# Patient Record
Sex: Male | Born: 1935
Health system: Southern US, Community
[De-identification: ages and names within clinical notes are randomized; demographics above are authoritative.]

## PROBLEM LIST (undated history)

## (undated) DIAGNOSIS — E785 Hyperlipidemia, unspecified: Secondary | ICD-10-CM

## (undated) DIAGNOSIS — F329 Major depressive disorder, single episode, unspecified: Secondary | ICD-10-CM

## (undated) DIAGNOSIS — Z973 Presence of spectacles and contact lenses: Secondary | ICD-10-CM

## (undated) DIAGNOSIS — I739 Peripheral vascular disease, unspecified: Secondary | ICD-10-CM

## (undated) DIAGNOSIS — M109 Gout, unspecified: Secondary | ICD-10-CM

## (undated) DIAGNOSIS — D472 Monoclonal gammopathy: Principal | ICD-10-CM

## (undated) DIAGNOSIS — Z9989 Dependence on other enabling machines and devices: Secondary | ICD-10-CM

## (undated) DIAGNOSIS — R3915 Urgency of urination: Secondary | ICD-10-CM

## (undated) DIAGNOSIS — I2510 Atherosclerotic heart disease of native coronary artery without angina pectoris: Secondary | ICD-10-CM

## (undated) DIAGNOSIS — N183 Chronic kidney disease, stage 3 unspecified: Secondary | ICD-10-CM

## (undated) DIAGNOSIS — K4020 Bilateral inguinal hernia, without obstruction or gangrene, not specified as recurrent: Secondary | ICD-10-CM

## (undated) DIAGNOSIS — G4733 Obstructive sleep apnea (adult) (pediatric): Secondary | ICD-10-CM

## (undated) DIAGNOSIS — I10 Essential (primary) hypertension: Secondary | ICD-10-CM

## (undated) DIAGNOSIS — N189 Chronic kidney disease, unspecified: Secondary | ICD-10-CM

## (undated) DIAGNOSIS — F32A Depression, unspecified: Secondary | ICD-10-CM

## (undated) DIAGNOSIS — E119 Type 2 diabetes mellitus without complications: Secondary | ICD-10-CM

## (undated) DIAGNOSIS — R351 Nocturia: Secondary | ICD-10-CM

## (undated) DIAGNOSIS — Z955 Presence of coronary angioplasty implant and graft: Secondary | ICD-10-CM

## (undated) DIAGNOSIS — N2581 Secondary hyperparathyroidism of renal origin: Secondary | ICD-10-CM

## (undated) DIAGNOSIS — M1990 Unspecified osteoarthritis, unspecified site: Secondary | ICD-10-CM

## (undated) DIAGNOSIS — D631 Anemia in chronic kidney disease: Secondary | ICD-10-CM

## (undated) HISTORY — DX: Essential (primary) hypertension: I10

## (undated) HISTORY — DX: Gout, unspecified: M10.9

## (undated) HISTORY — DX: Atherosclerotic heart disease of native coronary artery without angina pectoris: I25.10

## (undated) HISTORY — DX: Hyperlipidemia, unspecified: E78.5

## (undated) HISTORY — DX: Monoclonal gammopathy: D47.2

## (undated) HISTORY — DX: Depression, unspecified: F32.A

## (undated) HISTORY — DX: Presence of coronary angioplasty implant and graft: Z95.5

## (undated) HISTORY — DX: Chronic kidney disease, stage 3 (moderate): N18.3

## (undated) HISTORY — PX: CORONARY ANGIOPLASTY WITH STENT PLACEMENT: SHX49

## (undated) HISTORY — PX: TOTAL KNEE ARTHROPLASTY: SHX125

## (undated) HISTORY — DX: Chronic kidney disease, stage 3 unspecified: N18.30

## (undated) HISTORY — PX: RENAL ANGIOPLASTY: SHX2316

## (undated) HISTORY — PX: HERNIA REPAIR: SHX51

## (undated) HISTORY — DX: Major depressive disorder, single episode, unspecified: F32.9

---

## 1996-04-14 HISTORY — PX: CORONARY ANGIOPLASTY: SHX604

## 1997-09-12 ENCOUNTER — Ambulatory Visit (HOSPITAL_COMMUNITY): Admission: RE | Admit: 1997-09-12 | Discharge: 1997-09-12 | Payer: Self-pay | Admitting: Cardiology

## 1997-09-22 ENCOUNTER — Ambulatory Visit (HOSPITAL_COMMUNITY): Admission: RE | Admit: 1997-09-22 | Discharge: 1997-09-22 | Payer: Self-pay | Admitting: Cardiology

## 1997-11-27 ENCOUNTER — Emergency Department (HOSPITAL_COMMUNITY): Admission: EM | Admit: 1997-11-27 | Discharge: 1997-11-27 | Payer: Self-pay | Admitting: Unknown Physician Specialty

## 1998-04-14 HISTORY — PX: CORONARY ANGIOPLASTY WITH STENT PLACEMENT: SHX49

## 1998-05-14 ENCOUNTER — Inpatient Hospital Stay (HOSPITAL_COMMUNITY): Admission: EM | Admit: 1998-05-14 | Discharge: 1998-05-15 | Payer: Self-pay | Admitting: Emergency Medicine

## 1998-05-14 ENCOUNTER — Encounter: Payer: Self-pay | Admitting: Emergency Medicine

## 2000-03-06 ENCOUNTER — Encounter: Payer: Self-pay | Admitting: Emergency Medicine

## 2000-03-06 ENCOUNTER — Emergency Department (HOSPITAL_COMMUNITY): Admission: EM | Admit: 2000-03-06 | Discharge: 2000-03-06 | Payer: Self-pay | Admitting: Emergency Medicine

## 2000-03-25 ENCOUNTER — Ambulatory Visit (HOSPITAL_COMMUNITY): Admission: RE | Admit: 2000-03-25 | Discharge: 2000-03-25 | Payer: Self-pay

## 2000-06-25 ENCOUNTER — Ambulatory Visit (HOSPITAL_COMMUNITY): Admission: RE | Admit: 2000-06-25 | Discharge: 2000-06-25 | Payer: Self-pay | Admitting: Oncology

## 2000-06-25 ENCOUNTER — Encounter (INDEPENDENT_AMBULATORY_CARE_PROVIDER_SITE_OTHER): Payer: Self-pay | Admitting: Specialist

## 2000-07-14 ENCOUNTER — Ambulatory Visit (HOSPITAL_COMMUNITY): Admission: RE | Admit: 2000-07-14 | Discharge: 2000-07-14 | Payer: Self-pay | Admitting: Oncology

## 2000-07-14 ENCOUNTER — Encounter: Payer: Self-pay | Admitting: Oncology

## 2001-11-17 ENCOUNTER — Ambulatory Visit (HOSPITAL_COMMUNITY): Admission: RE | Admit: 2001-11-17 | Discharge: 2001-11-17 | Payer: Self-pay | Admitting: Gastroenterology

## 2002-05-05 ENCOUNTER — Encounter: Payer: Self-pay | Admitting: Internal Medicine

## 2002-05-05 ENCOUNTER — Encounter: Admission: RE | Admit: 2002-05-05 | Discharge: 2002-05-05 | Payer: Self-pay | Admitting: Internal Medicine

## 2004-01-30 ENCOUNTER — Ambulatory Visit (HOSPITAL_BASED_OUTPATIENT_CLINIC_OR_DEPARTMENT_OTHER): Admission: RE | Admit: 2004-01-30 | Discharge: 2004-01-30 | Payer: Self-pay | Admitting: Internal Medicine

## 2004-03-16 ENCOUNTER — Emergency Department (HOSPITAL_COMMUNITY): Admission: EM | Admit: 2004-03-16 | Discharge: 2004-03-16 | Payer: Self-pay | Admitting: Emergency Medicine

## 2004-03-21 ENCOUNTER — Inpatient Hospital Stay (HOSPITAL_COMMUNITY): Admission: EM | Admit: 2004-03-21 | Discharge: 2004-03-23 | Payer: Self-pay | Admitting: Emergency Medicine

## 2004-07-02 ENCOUNTER — Ambulatory Visit (HOSPITAL_COMMUNITY): Admission: RE | Admit: 2004-07-02 | Discharge: 2004-07-02 | Payer: Self-pay | Admitting: Gastroenterology

## 2005-06-30 ENCOUNTER — Ambulatory Visit: Payer: Self-pay | Admitting: Oncology

## 2005-07-02 ENCOUNTER — Ambulatory Visit (HOSPITAL_COMMUNITY): Admission: RE | Admit: 2005-07-02 | Discharge: 2005-07-02 | Payer: Self-pay | Admitting: Oncology

## 2005-07-22 ENCOUNTER — Inpatient Hospital Stay (HOSPITAL_COMMUNITY): Admission: RE | Admit: 2005-07-22 | Discharge: 2005-07-26 | Payer: Self-pay | Admitting: Orthopedic Surgery

## 2005-08-14 ENCOUNTER — Encounter: Admission: RE | Admit: 2005-08-14 | Discharge: 2005-11-12 | Payer: Self-pay | Admitting: Orthopedic Surgery

## 2005-11-13 ENCOUNTER — Encounter: Admission: RE | Admit: 2005-11-13 | Discharge: 2005-12-18 | Payer: Self-pay | Admitting: Orthopedic Surgery

## 2005-12-20 ENCOUNTER — Inpatient Hospital Stay (HOSPITAL_COMMUNITY): Admission: EM | Admit: 2005-12-20 | Discharge: 2005-12-24 | Payer: Self-pay | Admitting: Emergency Medicine

## 2006-01-08 ENCOUNTER — Encounter (HOSPITAL_COMMUNITY): Admission: RE | Admit: 2006-01-08 | Discharge: 2006-04-08 | Payer: Self-pay | Admitting: Cardiology

## 2006-04-09 ENCOUNTER — Encounter (HOSPITAL_COMMUNITY): Admission: RE | Admit: 2006-04-09 | Discharge: 2006-05-14 | Payer: Self-pay | Admitting: Cardiology

## 2006-05-15 ENCOUNTER — Encounter: Admission: RE | Admit: 2006-05-15 | Discharge: 2006-05-15 | Payer: Self-pay | Admitting: Cardiovascular Disease

## 2006-05-21 ENCOUNTER — Ambulatory Visit (HOSPITAL_COMMUNITY): Admission: RE | Admit: 2006-05-21 | Discharge: 2006-05-22 | Payer: Self-pay | Admitting: Cardiovascular Disease

## 2006-06-24 ENCOUNTER — Ambulatory Visit: Payer: Self-pay | Admitting: Oncology

## 2006-07-01 LAB — CBC WITH DIFFERENTIAL/PLATELET
BASO%: 0.7 % (ref 0.0–2.0)
Basophils Absolute: 0 10*3/uL (ref 0.0–0.1)
EOS%: 6.4 % (ref 0.0–7.0)
Eosinophils Absolute: 0.4 10*3/uL (ref 0.0–0.5)
HCT: 37.7 % — ABNORMAL LOW (ref 38.7–49.9)
HGB: 13.1 g/dL (ref 13.0–17.1)
LYMPH%: 22.7 % (ref 14.0–48.0)
MCH: 31.1 pg (ref 28.0–33.4)
MCHC: 34.7 g/dL (ref 32.0–35.9)
MCV: 89.8 fL (ref 81.6–98.0)
MONO#: 0.4 10*3/uL (ref 0.1–0.9)
MONO%: 6.5 % (ref 0.0–13.0)
NEUT#: 4.2 10*3/uL (ref 1.5–6.5)
NEUT%: 63.7 % (ref 40.0–75.0)
Platelets: 341 10*3/uL (ref 145–400)
RBC: 4.2 10*6/uL (ref 4.20–5.71)
RDW: 13.9 % (ref 11.2–14.6)
WBC: 6.5 10*3/uL (ref 4.0–10.0)
lymph#: 1.5 10*3/uL (ref 0.9–3.3)

## 2006-07-01 LAB — MORPHOLOGY: PLT EST: ADEQUATE

## 2006-07-06 LAB — BASIC METABOLIC PANEL
BUN: 24 mg/dL — ABNORMAL HIGH (ref 6–23)
CO2: 25 mEq/L (ref 19–32)
Calcium: 9.7 mg/dL (ref 8.4–10.5)
Chloride: 105 mEq/L (ref 96–112)
Creatinine, Ser: 1.61 mg/dL — ABNORMAL HIGH (ref 0.40–1.50)
Glucose, Bld: 111 mg/dL — ABNORMAL HIGH (ref 70–99)
Potassium: 4.8 mEq/L (ref 3.5–5.3)
Sodium: 141 mEq/L (ref 135–145)

## 2006-07-06 LAB — IMMUNOFIXATION ELECTROPHORESIS
IgA: 351 mg/dL (ref 68–378)
IgG (Immunoglobin G), Serum: 1570 mg/dL (ref 694–1618)
IgM, Serum: 36 mg/dL — ABNORMAL LOW (ref 60–263)
Total Protein, Serum Electrophoresis: 8.1 g/dL (ref 6.0–8.3)

## 2006-07-06 LAB — KAPPA/LAMBDA LIGHT CHAINS
Kappa free light chain: 2.92 mg/dL — ABNORMAL HIGH (ref 0.33–1.94)
Kappa:Lambda Ratio: 1.77 — ABNORMAL HIGH (ref 0.26–1.65)
Lambda Free Lght Chn: 1.65 mg/dL (ref 0.57–2.63)

## 2007-06-17 ENCOUNTER — Ambulatory Visit: Payer: Self-pay | Admitting: Oncology

## 2007-07-27 LAB — CREATININE CLEARANCE, URINE, 24 HOUR
Collection Interval-CRCL: 24 hours
Creatinine Clearance: 76 mL/min (ref 75–125)
Creatinine, 24H Ur: 1672 mg/d (ref 800–2000)
Creatinine, Urine: 98.4 mg/dL
Creatinine: 1.52 mg/dL — ABNORMAL HIGH (ref 0.40–1.50)
Urine Total Volume-CRCL: 1700 mL

## 2007-07-27 LAB — UIFE/LIGHT CHAINS/TP QN, 24-HR UR
Albumin, U: DETECTED
Free Kappa Lt Chains,Ur: 0.72 mg/dL (ref 0.04–1.51)
Free Kappa/Lambda Ratio: 6.55 ratio — ABNORMAL HIGH (ref 0.46–4.00)
Free Lambda Excretion/Day: 1.87 mg/d
Free Lambda Lt Chains,Ur: 0.11 mg/dL (ref 0.08–1.01)
Free Lt Chn Excr Rate: 12.24 mg/d
Time: 24 hours
Total Protein, Urine-Ur/day: 63 mg/d (ref 10–140)
Total Protein, Urine: 3.7 mg/dL
Volume, Urine: 1700 mL

## 2007-11-25 ENCOUNTER — Emergency Department (HOSPITAL_COMMUNITY): Admission: EM | Admit: 2007-11-25 | Discharge: 2007-11-25 | Payer: Self-pay | Admitting: Emergency Medicine

## 2008-04-14 HISTORY — PX: CARDIOVASCULAR STRESS TEST: SHX262

## 2008-06-30 ENCOUNTER — Ambulatory Visit: Payer: Self-pay | Admitting: Oncology

## 2008-07-04 LAB — CBC WITH DIFFERENTIAL/PLATELET
BASO%: 0.5 % (ref 0.0–2.0)
Basophils Absolute: 0 10*3/uL (ref 0.0–0.1)
EOS%: 5.2 % (ref 0.0–7.0)
Eosinophils Absolute: 0.4 10*3/uL (ref 0.0–0.5)
HCT: 40.8 % (ref 38.4–49.9)
HGB: 13.5 g/dL (ref 13.0–17.1)
LYMPH%: 27.5 % (ref 14.0–49.0)
MCH: 29.7 pg (ref 27.2–33.4)
MCHC: 33.1 g/dL (ref 32.0–36.0)
MCV: 89.9 fL (ref 79.3–98.0)
MONO#: 0.5 10*3/uL (ref 0.1–0.9)
MONO%: 6.6 % (ref 0.0–14.0)
NEUT#: 4.4 10*3/uL (ref 1.5–6.5)
NEUT%: 60.2 % (ref 39.0–75.0)
Platelets: 347 10*3/uL (ref 140–400)
RBC: 4.54 10*6/uL (ref 4.20–5.82)
RDW: 13.3 % (ref 11.0–14.6)
WBC: 7.3 10*3/uL (ref 4.0–10.3)
lymph#: 2 10*3/uL (ref 0.9–3.3)
nRBC: 0 % (ref 0–0)

## 2008-07-04 LAB — MORPHOLOGY: PLT EST: ADEQUATE

## 2008-07-05 LAB — COMPREHENSIVE METABOLIC PANEL
ALT: 28 U/L (ref 0–53)
AST: 24 U/L (ref 0–37)
Albumin: 4.8 g/dL (ref 3.5–5.2)
Alkaline Phosphatase: 98 U/L (ref 39–117)
BUN: 23 mg/dL (ref 6–23)
CO2: 22 mEq/L (ref 19–32)
Calcium: 10 mg/dL (ref 8.4–10.5)
Chloride: 103 mEq/L (ref 96–112)
Creatinine, Ser: 1.69 mg/dL — ABNORMAL HIGH (ref 0.40–1.50)
Glucose, Bld: 124 mg/dL — ABNORMAL HIGH (ref 70–99)
Potassium: 4.2 mEq/L (ref 3.5–5.3)
Sodium: 137 mEq/L (ref 135–145)
Total Bilirubin: 0.4 mg/dL (ref 0.3–1.2)
Total Protein: 8.1 g/dL (ref 6.0–8.3)

## 2008-07-05 LAB — KAPPA/LAMBDA LIGHT CHAINS
Kappa free light chain: 1.64 mg/dL (ref 0.33–1.94)
Kappa:Lambda Ratio: 0.64 (ref 0.26–1.65)
Lambda Free Lght Chn: 2.55 mg/dL (ref 0.57–2.63)

## 2008-07-05 LAB — IGG: IgG (Immunoglobin G), Serum: 1560 mg/dL (ref 694–1618)

## 2009-06-28 ENCOUNTER — Ambulatory Visit: Payer: Self-pay | Admitting: Oncology

## 2009-07-02 LAB — MORPHOLOGY
PLT EST: ADEQUATE
RBC Comments: NORMAL

## 2009-07-02 LAB — CBC WITH DIFFERENTIAL/PLATELET
BASO%: 0.5 % (ref 0.0–2.0)
Basophils Absolute: 0 10*3/uL (ref 0.0–0.1)
EOS%: 5.7 % (ref 0.0–7.0)
Eosinophils Absolute: 0.4 10*3/uL (ref 0.0–0.5)
HCT: 39.7 % (ref 38.4–49.9)
HGB: 12.9 g/dL — ABNORMAL LOW (ref 13.0–17.1)
LYMPH%: 24.4 % (ref 14.0–49.0)
MCH: 30.2 pg (ref 27.2–33.4)
MCHC: 32.5 g/dL (ref 32.0–36.0)
MCV: 93 fL (ref 79.3–98.0)
MONO#: 0.4 10*3/uL (ref 0.1–0.9)
MONO%: 6.4 % (ref 0.0–14.0)
NEUT#: 3.9 10*3/uL (ref 1.5–6.5)
NEUT%: 63 % (ref 39.0–75.0)
Platelets: 343 10*3/uL (ref 140–400)
RBC: 4.27 10*6/uL (ref 4.20–5.82)
RDW: 13.6 % (ref 11.0–14.6)
WBC: 6.1 10*3/uL (ref 4.0–10.3)
lymph#: 1.5 10*3/uL (ref 0.9–3.3)
nRBC: 0 % (ref 0–0)

## 2009-07-03 LAB — COMPREHENSIVE METABOLIC PANEL
ALT: 27 U/L (ref 0–53)
AST: 25 U/L (ref 0–37)
Albumin: 4.6 g/dL (ref 3.5–5.2)
Alkaline Phosphatase: 72 U/L (ref 39–117)
BUN: 35 mg/dL — ABNORMAL HIGH (ref 6–23)
CO2: 21 mEq/L (ref 19–32)
Calcium: 9.8 mg/dL (ref 8.4–10.5)
Chloride: 107 mEq/L (ref 96–112)
Creatinine, Ser: 1.94 mg/dL — ABNORMAL HIGH (ref 0.40–1.50)
Glucose, Bld: 136 mg/dL — ABNORMAL HIGH (ref 70–99)
Potassium: 4.9 mEq/L (ref 3.5–5.3)
Sodium: 140 mEq/L (ref 135–145)
Total Bilirubin: 0.4 mg/dL (ref 0.3–1.2)
Total Protein: 7.8 g/dL (ref 6.0–8.3)

## 2009-07-03 LAB — KAPPA/LAMBDA LIGHT CHAINS
Kappa free light chain: 2.57 mg/dL — ABNORMAL HIGH (ref 0.33–1.94)
Kappa:Lambda Ratio: 0.98 (ref 0.26–1.65)
Lambda Free Lght Chn: 2.63 mg/dL (ref 0.57–2.63)

## 2009-07-03 LAB — IGG: IgG (Immunoglobin G), Serum: 1700 mg/dL — ABNORMAL HIGH (ref 694–1618)

## 2010-05-01 DIAGNOSIS — I2510 Atherosclerotic heart disease of native coronary artery without angina pectoris: Secondary | ICD-10-CM | POA: Insufficient documentation

## 2010-05-01 DIAGNOSIS — E1169 Type 2 diabetes mellitus with other specified complication: Secondary | ICD-10-CM | POA: Insufficient documentation

## 2010-05-01 DIAGNOSIS — I10 Essential (primary) hypertension: Secondary | ICD-10-CM | POA: Insufficient documentation

## 2010-05-01 DIAGNOSIS — E782 Mixed hyperlipidemia: Secondary | ICD-10-CM

## 2010-05-01 DIAGNOSIS — I739 Peripheral vascular disease, unspecified: Secondary | ICD-10-CM | POA: Insufficient documentation

## 2010-05-02 ENCOUNTER — Encounter: Payer: Self-pay | Admitting: Internal Medicine

## 2010-05-02 ENCOUNTER — Ambulatory Visit
Admission: RE | Admit: 2010-05-02 | Discharge: 2010-05-02 | Payer: Self-pay | Source: Home / Self Care | Attending: Internal Medicine | Admitting: Internal Medicine

## 2010-05-16 NOTE — Assessment & Plan Note (Signed)
Summary: np6   Visit Type:  Initial Consult Primary Provider:  Dr Posey Pronto [facility]  CC:  no complaints.  History of Present Illness: Mr. Ficco is a delightful 77 y/o male with multiple medical problems including DM2, HTN, HL, mild renal insufficiency, RAS s/p stenting of L renal artery in 2008, CAD s/p multiple PCIs on OM-1 (last cath 2007). Previously foolowed by Dr. Claiborne Billings and now transferring his care here.  Last Myoview 2010. Ok by his report.    Doing well. Relatively active around the house but does not exercise regularly. No CP or SOB. No HF. BP well contorlled. Takes all meds as prescribed,      Preventive Screening-Counseling & Management  Caffeine-Diet-Exercise     Does Patient Exercise: no  Problems Prior to Update: 1)  Hypertension, Unspecified  (ICD-401.9) 2)  Cad, Native Vessel  (ICD-414.01) 3)  Hyperlipidemia-mixed  (ICD-272.4) 4)  Pvd  (ICD-443.9)  Medications Prior to Update: 1)  None  Current Medications (verified): 1)  Allopurinol 100 Mg Tabs (Allopurinol) .... Once Daily 2)  Aspirin Ec 325 Mg Tbec (Aspirin) .... Take One Tablet By Mouth Daily 3)  Citalopram Hydrobromide 20 Mg Tabs (Citalopram Hydrobromide) .... Two Times A Day 4)  Furosemide 40 Mg Tabs (Furosemide) .... Take One Tablet By Mouth Daily. 5)  Lisinopril 10 Mg Tabs (Lisinopril) .... Take One Tablet By Mouth Daily 6)  Losartan Potassium 25 Mg Tabs (Losartan Potassium) .... Take 1 Tablet By Mouth Once A Day 7)  Metformin Hcl 500 Mg Tabs (Metformin Hcl) .... Take 1 Tablet By Mouth Once A Day 8)  Metoprolol Succinate 100 Mg Xr24h-Tab (Metoprolol Succinate) .... Take One Tablet By Mouth Daily 9)  Plavix 75 Mg Tabs (Clopidogrel Bisulfate) .... Take One Tablet By Mouth Daily 10)  Nitrostat 0.4 Mg Subl (Nitroglycerin) .[name].. 1 Tablet Under Tongue At Onset of Chest Pain; You May Repeat Every 5 Minutes For Up To 3 Doses. 11)  Simvastatin 80 Mg Tabs (Simvastatin) .... Take One Tablet By Mouth  Daily At Bedtime  Allergies (verified): No Known Drug Allergies  Past History:  Family History: Last updated: 05/02/2010 Family History of Diabetes:  Family History of Coronary Artery Disease:  Family History of Cancer:   Social History: Last updated: 05/02/2010 Retired  Married  Tobacco Use - No.  Alcohol Use - no Drug Use - no Regular Exercise - no  Risk Factors: Exercise: no (05/02/2010)  Risk Factors: Smoking Status: never (05/01/2010)  Past Medical History: 1. Renal insufficiency 2. CAD    --s/p PTCA of OM in 1998    --s/p BMS OM in 2000   --cath 9/07/ Lm ok LAD ok. LCX 95% in OM prior to previous stent RCA. nondominant normal EF normal    --s/p Cypher DES to OM 2007 (placed proxiaml to previous stent) 3. Diabetes Type 2 4. Hyperlipidemia 5. PVD 6. Hypertension  Past Surgical History: Knee Arthroplasty-Total  Family History: Reviewed history from 05/01/2010 and no changes required. Family History of Diabetes:  Family History of Coronary Artery Disease:  Family History of Cancer:   Social History: Reviewed history from 05/01/2010 and no changes required. Retired  Married  Tobacco Use - No.  Alcohol Use - no Drug Use - no Regular Exercise - no Does Patient Exercise:  no  Review of Systems       As per HPI and past medical history; otherwise all systems negative.   Vital Signs:  Patient profile:   77 year old male  Height:      68 inches Weight:      182 pounds BMI:     27.77 Pulse rate:   51 / minute BP sitting:   132 / 62  (left arm) Cuff size:   regular  Vitals Entered By: Mignon Pine, RMA (May 02, 2010 12:30 PM)  Physical Exam  General:  Elderly: well appearing. no resp difficulty HEENT: normal Neck: supple. no JVD. Carotids 2+ bilat; no bruits. No lymphadenopathy or thryomegaly appreciated. Cor: PMI nondisplaced. Regular rate & rhythm. No rubs, murmur. +s4 Lungs: clear Abdomen: soft, nontender, nondistended. No  hepatosplenomegaly. No bruits or masses. Good bowel sounds. Extremities: no cyanosis, clubbing, rash, edema Neuro: alert & orientedx3, cranial nerves grossly intact. moves all 4 extremities w/o difficulty. affect pleasant    Impression & Recommendations:  Problem # 1:  CAD, NATIVE VESSEL (ICD-414.01) Stable. No evidence of ischemia. Continue current regimen.   Problem # 2:  HYPERTENSION, UNSPECIFIED (ICD-401.9) BP well controlled. Given that he is on low doses of bothe lisinopril and losartant, we discussed potentiall consolidating to lisinopril 20 but he prefers to stay on his current regimen.  Problem # 3:  HYPERLIPIDEMIA-MIXED (B2193296.4) Followed by Dr. Posey Pronto. We discussed Black Box warning re: simva 80 and suggested switching to Lipitor. However, he does not want to switch due to cost. As he has been stable on thsi for > 3 yrs will continue. Goal LDL < 70.  Other Orders: EKG w/ Interpretation (93000)  Patient Instructions: 1)  Your physician wants you to follow-up in:  6 months.  You will receive a reminder letter in the mail two months in advance. If you don't receive a letter, please call our office to schedule the follow-up appointment.

## 2010-05-24 ENCOUNTER — Encounter: Payer: Self-pay | Admitting: Cardiovascular Disease

## 2010-05-24 ENCOUNTER — Encounter (INDEPENDENT_AMBULATORY_CARE_PROVIDER_SITE_OTHER): Payer: Medicare Other

## 2010-05-24 DIAGNOSIS — I701 Atherosclerosis of renal artery: Secondary | ICD-10-CM

## 2010-05-24 DIAGNOSIS — N183 Chronic kidney disease, stage 3 unspecified: Secondary | ICD-10-CM

## 2010-05-28 ENCOUNTER — Encounter: Payer: Self-pay | Admitting: Cardiovascular Disease

## 2010-05-30 NOTE — Miscellaneous (Signed)
Summary: Orders Update  Clinical Lists Changes  Orders: Added new Test order of Renal Artery Duplex (Renal Artery Duplex) - Signed 

## 2010-07-01 ENCOUNTER — Encounter (HOSPITAL_BASED_OUTPATIENT_CLINIC_OR_DEPARTMENT_OTHER): Payer: Medicare Other | Admitting: Oncology

## 2010-07-01 ENCOUNTER — Other Ambulatory Visit: Payer: Self-pay | Admitting: Oncology

## 2010-07-01 DIAGNOSIS — N189 Chronic kidney disease, unspecified: Secondary | ICD-10-CM

## 2010-07-01 DIAGNOSIS — D472 Monoclonal gammopathy: Secondary | ICD-10-CM

## 2010-07-01 LAB — CBC WITH DIFFERENTIAL/PLATELET
BASO%: 0.3 % (ref 0.0–2.0)
Basophils Absolute: 0 10*3/uL (ref 0.0–0.1)
EOS%: 7.4 % — ABNORMAL HIGH (ref 0.0–7.0)
Eosinophils Absolute: 0.5 10*3/uL (ref 0.0–0.5)
HCT: 34.2 % — ABNORMAL LOW (ref 38.4–49.9)
HGB: 11.4 g/dL — ABNORMAL LOW (ref 13.0–17.1)
LYMPH%: 18.7 % (ref 14.0–49.0)
MCH: 29.8 pg (ref 27.2–33.4)
MCHC: 33.3 g/dL (ref 32.0–36.0)
MCV: 89.5 fL (ref 79.3–98.0)
MONO#: 0.5 10*3/uL (ref 0.1–0.9)
MONO%: 7.6 % (ref 0.0–14.0)
NEUT#: 4.1 10*3/uL (ref 1.5–6.5)
NEUT%: 66 % (ref 39.0–75.0)
Platelets: 414 10*3/uL — ABNORMAL HIGH (ref 140–400)
RBC: 3.82 10*6/uL — ABNORMAL LOW (ref 4.20–5.82)
RDW: 13.8 % (ref 11.0–14.6)
WBC: 6.2 10*3/uL (ref 4.0–10.3)
lymph#: 1.2 10*3/uL (ref 0.9–3.3)

## 2010-07-01 LAB — MORPHOLOGY: PLT EST: INCREASED

## 2010-07-02 LAB — COMPREHENSIVE METABOLIC PANEL
ALT: 25 U/L (ref 0–53)
AST: 28 U/L (ref 0–37)
Albumin: 4 g/dL (ref 3.5–5.2)
Alkaline Phosphatase: 92 U/L (ref 39–117)
BUN: 25 mg/dL — ABNORMAL HIGH (ref 6–23)
CO2: 21 mEq/L (ref 19–32)
Calcium: 9.3 mg/dL (ref 8.4–10.5)
Chloride: 107 mEq/L (ref 96–112)
Creatinine, Ser: 1.36 mg/dL (ref 0.40–1.50)
Glucose, Bld: 87 mg/dL (ref 70–99)
Potassium: 4.8 mEq/L (ref 3.5–5.3)
Sodium: 138 mEq/L (ref 135–145)
Total Bilirubin: 0.4 mg/dL (ref 0.3–1.2)
Total Protein: 7 g/dL (ref 6.0–8.3)

## 2010-07-02 LAB — KAPPA/LAMBDA LIGHT CHAINS
Kappa free light chain: 2.2 mg/dL — ABNORMAL HIGH (ref 0.33–1.94)
Kappa:Lambda Ratio: 0.7 (ref 0.26–1.65)
Lambda Free Lght Chn: 3.16 mg/dL — ABNORMAL HIGH (ref 0.57–2.63)

## 2010-07-02 LAB — IGG: IgG (Immunoglobin G), Serum: 1650 mg/dL — ABNORMAL HIGH (ref 694–1618)

## 2010-07-02 LAB — LACTATE DEHYDROGENASE: LDH: 101 U/L (ref 94–250)

## 2010-07-16 ENCOUNTER — Encounter (HOSPITAL_BASED_OUTPATIENT_CLINIC_OR_DEPARTMENT_OTHER): Payer: Medicare Other | Admitting: Oncology

## 2010-07-16 ENCOUNTER — Other Ambulatory Visit: Payer: Self-pay | Admitting: Oncology

## 2010-07-16 DIAGNOSIS — N189 Chronic kidney disease, unspecified: Secondary | ICD-10-CM

## 2010-07-16 DIAGNOSIS — I1310 Hypertensive heart and chronic kidney disease without heart failure, with stage 1 through stage 4 chronic kidney disease, or unspecified chronic kidney disease: Secondary | ICD-10-CM

## 2010-07-16 DIAGNOSIS — D472 Monoclonal gammopathy: Secondary | ICD-10-CM

## 2010-07-16 DIAGNOSIS — I2510 Atherosclerotic heart disease of native coronary artery without angina pectoris: Secondary | ICD-10-CM

## 2010-07-18 LAB — UIFE/LIGHT CHAINS/TP QN, 24-HR UR
Albumin, U: DETECTED
Alpha 1, Urine: DETECTED — AB
Alpha 2, Urine: DETECTED — AB
Beta, Urine: DETECTED — AB
Free Kappa Lt Chains,Ur: 2.92 mg/dL — ABNORMAL HIGH (ref 0.04–1.51)
Free Kappa/Lambda Ratio: 16.22 ratio — ABNORMAL HIGH (ref 0.46–4.00)
Free Lambda Excretion/Day: 1.44 mg/d
Free Lambda Lt Chains,Ur: 0.18 mg/dL (ref 0.08–1.01)
Free Lt Chn Excr Rate: 23.36 mg/d
Gamma Globulin, Urine: DETECTED — AB
Time: 24 hours
Total Protein, Urine-Ur/day: 56 mg/d (ref 10–140)
Total Protein, Urine: 7 mg/dL
Volume, Urine: 800 mL

## 2010-08-30 NOTE — H&P (Signed)
Kevin Schwartz, Kevin Schwartz             ACCOUNT NO.:  [id number]   MEDICAL RECORD NO.:  [id number]          PATIENT TYPE:  INP   LOCATION:  NA                           FACILITY:  [facility]   PHYSICIAN:  Tarri Glenn, M.D.  DATE OF BIRTH:  1931/04/03   DATE OF ADMISSION:  07/22/2005  DATE OF DISCHARGE:                                HISTORY & PHYSICAL   CHIEF COMPLAINT:  Pain in my left knee.   PRESENT ILLNESS:  This is a 77 year old black male been seen by [place] for  continuing progressive problems concerning pain into his left knee.  He has  locking stiffness, inability to get about due to progressive problems  concerning pain into this knee which has been going on for several years  now.  He has lost range of motion in the knee to the point of 10 degrees  lacking in full extension.  He has also developed a genu varum.  He is  really quite tired of this pain and discomfort.  He is having progressive  problems with his day-to-day activities, and after much consideration  including the risks and benefits of surgery, it was decided he would benefit  from surgical intervention and is being admitted for total knee replacement  arthroplasty to the left knee.   Dr. __________ is his family physician with [facility].  He has  had workup by Dr. __________ as well as Dr. Beryle Beams.  Dr. Megan Salon is  his cardiologist.   ALLERGIES:  No known medical allergies.   MEDICATIONS:  1.  Allopurinol 100 mg daily.  2.  Aspirin 325 mg daily (will stop preoperatively).  3.  Colchicine 0.6 mg one daily.  4.  Diovan 40 mg daily.  5.  Indomethacin 500 mg daily (?).  6.  Metformin 500 mg daily.  7.  Metoprolol 100 mg two daily.   PAST MEDICAL HISTORY:  1.  Hypertension.  2.  History of myocardial infarction, 1998.  3.  Gout.  4.  He has had stents implanted in the coronary arteries in 1999 and 2000.  5.  Herniated nucleus pulposus in 1998.  6.  He has non-insulin-dependent diabetes  mellitus.   FAMILY HISTORY:  Positive for diabetes in his family.   SOCIAL HISTORY:  The patient is married, retired, no intake of alcohol or  tobacco products.  His wife, Resa Miner, will be major caregiver after surgery.   REVIEW OF SYSTEMS:  CNS:  No seizure disorder, paralysis, numbness, double  vision.  RESPIRATORY:  No productive cough, no hemoptysis, no shortness of  breath.  CARDIOVASCULAR:  No chest pain, no angina, no orthopnea.  GASTROINTESTINAL:  No nausea, vomiting, melena, bloody stools.  GENITOURINARY:  No discharge, dysuria, hematuria.  He has some nocturia.   PHYSICAL EXAMINATION:  GENERAL:  Alert, cooperative, friendly 77 year old  black male who is accompanied by his wife.  VITAL SIGNS:  Blood pressure  164/92, pulse 60, respirations 12, temperature  98.  HEENT:  Normocephalic.  PERLA.  EOMs intact.  Oropharynx is clear.  CHEST:  Clear to auscultation.  No rhonchi.  No rales.  HEART:  Regular rate and rhythm.  No murmurs are heard.  ABDOMEN:  Soft, nontender.  Liver spleen not felt.  GENITALIA/RECTAL:  Not done.  Not pertinent to present illness.  EXTREMITIES:  The left knee as in present illness above.   ADMITTING DIAGNOSES:  1.  End stage osteoarthritis of the left knee.  2.  Hypertension.  3.  Coronary artery disease.  4.  Coronary stents.  5.  Gout.  6.  Sleep apnea.   PLAN:  The patient will undergo a left total knee replacement arthroplasty.  He will bring his CPAP machine to the hospital with him to use during his  postoperative period.  Should we have any cardiac problems, we will  certainly call Dr. Melvern Banker.  And should we have any medical problems, we will  call Dr. __________ .  We plan home health after his regular  hospitalization.      Dooley L. Vanita Ingles.    ______________________________  Tarri Glenn, M.D.    DLU/MEDQ  D:  07/10/2005  T:  07/11/2005  Job:  [id number]

## 2010-08-30 NOTE — Procedures (Signed)
NAMETAIJUAN, FETCH             ACCOUNT NO.:  [id number]   MEDICAL RECORD NO.:  [id number]          PATIENT TYPE:  OUT   LOCATION:  SLEEP CENTER                 FACILITY:  [facility]   PHYSICIAN:  Clinton D. Annamaria Boots, M.D. DATE OF BIRTH:  20-Jan-1931   DATE OF STUDY:  01/30/2004                              NOCTURNAL POLYSOMNOGRAM   REFERRING PHYSICIAN:  Willey Blade, M.D.   INDICATION FOR THE STUDY:  Hypersomnia with sleep apnea.   EPWORTH SLEEPINESS SCORE:  10/24   BODY MASS INDEX:  31   WEIGHT:  208 pounds   SLEEP ARCHITECTURE:  Total sleep time 378 minutes with sleep efficiency 83%.  Stage I was 12%, stage II 53%, stages III and IV were absent, REM was 35% of  total sleep time, latency to sleep onset 49 minutes, latency to REM 67  minutes, awake after sleep onset 28 minutes, arousal index 14.   RESPIRATORY DATA:  Split-study protocol.  RDI 51.6/hr indicating severe  obstructive sleep apnea/hypopnea syndrome before CPAP.  This included 96  obstructive apneas and 48 hypopneas before CPAP.  He slept only on his back  during the initial part of the study.  REM RDI was 22/hr.  CPAP was titrated  to 10 CWP, RDI 0/hr using a medium Respironics ComfortGel Mask and heated  humidifier.   OXYGEN DATA:  Loud snoring with oxygen desaturation to a nadir of 81% before  CPAP.  After CPAP titration oxygen saturation held 97-98%.   CARDIAC DATA:  Sinus rhythm with occasional sinus pause.   MOVEMENT/PARASOMNIA:  Two hundred sixty two limb jerks were recorded of  which 17 were associated with arousal or awakening for a periodic limb  movement with arousal index of 2.5/hr which is mildly increased.   IMPRESSION/RECOMMENDATION:  Severe obstructive sleep apnea/hypopnea  syndrome, respiratory disturbance index 51.6/hr with desaturation to 81%.  Successful continuous positive airway pressure titration to 10  CWP, respiratory disturbance index 0/hr using a medium Respironics  ComfortGel Mask.   Mild periodic limb movement with arousal, 2.5/hr.                                                           Tarri Fuller D. Annamaria Boots, M.D.  Diplomate, [employer] Board   CDY/MEDQ  D:  02/04/2004 11:53:05  T:  02/04/2004 22:16:04  Job:  [id number]

## 2010-08-30 NOTE — Discharge Summary (Signed)
NAMECLARNECE, DORT NO.:  [id number]   MEDICAL RECORD NO.:  [id number]          PATIENT TYPE:  OIB   LOCATION:  3703                         FACILITY:  [facility]   PHYSICIAN:  Quay Burow, M.D.   DATE OF BIRTH:  11/24/1930   DATE OF ADMISSION:  05/21/2006  DATE OF DISCHARGE:  05/22/2006                               DISCHARGE SUMMARY   DISCHARGE DIAGNOSES:  1. Peripheral vascular disease with renal artery stenosis, status post      left renal artery stenting this admission.  2. Coronary disease, previous obtuse marginal stenting with a Cypher      stent by Dr. Claiborne Billings.  3. Dyslipidemia.  4. Non-insulin diabetes.  5. Mild renal insufficiency with a creatinine of 1.4 in the hospital.  6. History of gout.   HOSPITAL COURSE:  The patient is a 77 year old male followed by Dr.  Melvern Banker with a history of coronary disease.  He had a catheterization in  September 2007 and had an OM Cypher stent placed by Dr. Claiborne Billings.  At that  time he was noted to have an 85-90% left renal artery stenosis.  Renal  Doppler study as an outpatient suggested significant stenosis.  He does  have mild renal insufficiency.  His creatinine has gone as high as 1.9  in the office.  He was set up to come in for a peripheral angiogram.  This was done on May 21, 2006.  He had a left renal artery stenosis  that was dilated and stented.  Postprocedure he was hydrated.  We held  his ARB and his diuretics.  We feel he can be discharged May 22, 2006.  He will need follow-up renal Dopplers as an outpatient.  He will  need a BMP in the office next week.   DISCHARGE MEDICATIONS:  1. Allopurinol 100 mg a day.  2. Coated aspirin once a day.  3. Plavix 75 mg a day.  4. Colchicine 0.6 mg once a day.  5. Diovan 80 mg a day.  6. Metformin 500 mg a day, he will start this on Sunday.  7. Metoprolol has been cut back to 50 mg once a day for some transient      AV block.  8. Simvastatin 80 mg a day.  9.  Nitroglycerin sublingual p.r.n.  10.Lasix 40 mg a day.   LABORATORY DATA:  White count 7.5, hemoglobin 11.8, hematocrit 34.9,  platelets 344.  Sodium 138, potassium 4.3, BUN 14, creatinine 1.4.  TSH  is pending.  Chest x-ray done on the 1st shows cardiomegaly with some  minimal scarring at the left base.   DISPOSITION:  The patient is discharged in stable condition.  He did  have some transient AV block on telemetry with a dropped QRS.  We cut  his metoprolol back to 50 mg once a day.  He will have a BMP next week.      Erlene Quan, P.A.      Quay Burow, M.D.  Electronically Signed    LKK/MEDQ  D:  05/22/2006  T:  05/22/2006  Job:  [id number]   cc:  Nolene Ebbs, M.D.

## 2010-08-30 NOTE — Cardiovascular Report (Signed)
NAMEIRAM, SEMINARIO NO.:  [id number]   MEDICAL RECORD NO.:  [id number]          PATIENT TYPE:  OIB   LOCATION:  3703                         FACILITY:  [facility]   PHYSICIAN:  Quay Burow, M.D.   DATE OF BIRTH:  Aug 24, 1930   DATE OF PROCEDURE:  05/21/2006  DATE OF DISCHARGE:                            CARDIAC CATHETERIZATION   HISTORY:  Mr. Bueti is a 77 year old, mildly overweight, African-  American male with a history of CAD, status post PCI stenting with OM by  Dr. Ellouise Newer on December 22, 2005.  History of hypertension and cath  at that time revealed 80% left renal artery stenosis.  Renal Doppler  suggested systolic velocity of [id number].  His creatinine was right in the 1.5  to 1.9 range.  He was brought in for PT and stenting for treatment of  renal vascular hypertension and renal preservation.   PROCEDURE:  The patient was brought to the [facility] angiographic suite  in the postabsorptive state.  He was pre-medicated with p.o. Valium and  IV bicarb for radiocontrast nephropathy prophylaxis.  His right leg was  prepped and draped in the usual sterile fashion.  One-percent Xylocaine  was used for local anesthesia.  A 6 French sheath was inserted into the  right femoral artery using the standard Seldinger technique.  A 5 French  pigtail catheter was used for abdominal aortography.  Isovue was used  for the entirety of the case.   Angiographic results:  80% segmental, proximal/ostial left prerenal  artery stenosis.  The right renal artery appeared normal as did the  inferior renal abdominal aorta.  The iliacs were torturous.   PROCEDURE IN DETAIL:  The patient received 3000 units of heparin  intravenously.  Using a 6 [place] short JR4 guide catheter as well as a  __________ stabilizing wire and a 4 x 15 aviator balloon, PT was  performed.  Following this, a 6 x 15 aviator __________ balloon was then  carefully positioned angiographically and fluoroscopically  and deployed  at 8 to 10 atmospheres, resulting in reduction of 80% proximal left  renal artery stenosis to 0% residual.  The patient tolerated the  procedure well.  The guidewire and catheter were removed.  As mentioned,  the sheath was removed.  Pressure was held on the groin to achieve  hemostasis.  The patient left the lab in stable condition.  Re-hydrated  overnight, discharged home in the morning and we will get follow-up  renal Doppler studies after which we will see back in the office.  He  left the lab in stable condition.      Quay Burow, M.D.  Electronically Signed     JB/MEDQ  D:  05/21/2006  T:  05/21/2006  Job:  [id number]   cc:   Cath lab  [facility]  Bryson Dames, M.D.

## 2010-08-30 NOTE — Op Note (Signed)
[name], [name]             ACCOUNT NO.:  [id number]   MEDICAL RECORD NO.:  [id number]          PATIENT TYPE:  INP   LOCATION:  1521                         FACILITY:  [facility]   PHYSICIAN:  Tarri Glenn, M.D.  DATE OF BIRTH:  17-Dec-1930   DATE OF PROCEDURE:  07/22/2005  DATE OF DISCHARGE:                                 OPERATIVE REPORT   PREOPERATIVE DIAGNOSIS:  Osteoarthritis left knee.   POSTOPERATIVE DIAGNOSIS:  Osteoarthritis left knee.   OPERATION:  Osteonics total knee replacement left.   SURGEON:  Tarri Glenn, M.D.   ASSISTANTElodia Florence. Underwood, P.A.-C.   ANESTHESIA:  General.   INDICATIONS FOR PROCEDURE:  Significant pain, left knee, with advanced  tricompartmental arthritis with bone-on-bone abutment and roughly a 15  degrees flexion contracture.   DESCRIPTION OF PROCEDURE:  Prophylactic antibiotic, satisfactory general  anesthesia, pneumatic tourniquet, SureFoot left hip stabilizer, DuraPrep  from tourniquet to ankle, and draped in sterile field, Ioban employed.  The  leg had Esmarch applied sterilely and tourniquet inflated to 325 mmHg.  I  made a central vertical incision and with a median parapatellar incision,  opened the joint freeing up the patellar mechanism so that the patella could  be everted and the knee flexed.  I released the pes anserinus and medial  collateral ligament off the tibia.  Resected remnants of the menisci and of  the ACL and a portion of the PCL.  We debrided up the patella and the  femoral condyle with rongeurs.  I then made a 5/16 drill hole in the distal  femur followed by the canal finder and the axis aligner for 5 degrees valgus  cut on the left knee.  I elected to take 12 mm off the distal femur because  of the flexion contracture rather than the usual 10 mm.  I then made a  leveling cut on the tibia to allow a little additional room to place the  distal femoral sizing jig and we sized the femur at a size 7.  Scribe  lines  were placed in the distal femur to place the distal femoral cutting jig and  anterior and posterior cuts and posterior and anterior chamfer were then  made.  I then returned to the tibia and with baseplate initially a size 7 to  9 was felt to be the appropriate size.  I used a 7 to match the femur.  The  initial intramedullary drill hole was made followed by step-cut drill canal  finder.  I then inserted the intramedullary rod with the external cutting  jig set for a 90 degree cut. With his flexion contracture and tightness of  the knee, I went ahead and took a 6 mm cut off the recessed the medial  tibial plateau.  Having made this cut and trimmed up the remnants of the  posterior cruciate ligament and menisci, I then placed the jig on the distal  femur for creating both the patellar groove and also for the notchplasty.  Having completed these two procedures, I then used a laminar spreader and  removed some residual soft tissue  and bone beneath the posterior femoral  condyles.  We then went through a trial reduction and found that a 10 mm  insert fit nicely so no additional tibial bone had to be removed.  While  these components were in place, I used the external rod splitting the  bimalleolar distance to mark the scribe lines on the tibia for final  positioning of the base plate.  Also, with the knee in extension, I measured  the patella at size 26 and I used the 10 mm recessed cutting jig to make the  initial 10 mm cut followed by the jig for creating the three fixation holes.  I then placed the trial patella trimming up bone from around the patella.  We then returned to the tibia.  We had noted a large cyst in the anterior  medial tibia measuring about 2 cm by 1 cm just adjacent to the outer wall of  the anterior medial tibia.  I curetted out all of the synovial type tissue  from the canal which we then packed with cancellus bone taken from the case  during the bone cutting.  This  was impacted.  We then sized the tibia at a 9  rather than 7, placed the baseplate using the previously noted scribe lines,  affixing it with three stabilizing pins.  We then used the tripod apparatus  for reaming for the tibial keel going up to a 9 cemented.  Following this, I  then began reaming the distal canal.  He is was a very active man with the  significant varus deformity and I felt that an 80 mm extension would give  him a more stable knee.  I ended up reaming up to a size 19. The trial 80 mm  extension was placed on the trial tibial component and fit nicely.  Having  completed all this, we then water picked the knee while the components were  opened and the final extender placed on the tibial component.  At the same  time, the scrub nurse mixed the methyl methacrylate and I then individually  glued in the components.  We did not glue in the extender for the tibia but  just the parent portion of the tibial component, impacting it, and removing  excess methacrylate.  I placed an 8 mm spacer while we glued in the femur,  impacted it, held it in extension while we glued in the patellar and held it  with a patellar holding clamp.  When the methacrylate had hardened, we  trimmed up remnants of methacrylate from around the components and went  through another trial reduction finding that indeed the 10-mm spacer was the  appropriate size.  Once again, after irrigating and checking to make sure  there was no particulate matter in the knee, the final 8 mm posterior  stabilized insert was placed. The knee was reduced and found be nice and  stable with motion past 90 degrees in full extension.  The patella was  stable and did not require a lateral release.  A Hemovac was placed and the  wound was closed in layers with interrupted #1 Vicryl in the synovium and  capsule distally and two layers in the quadriceps proximally, the subcutaneous tissue was closed with a combination of #1, 0 and 2-0  Vicryl,  skin with staples.  Betadine and Adaptic dry, sterile dressing were applied.  The tourniquet was released at 2 hours of tourniquet time.  The leg was  placed in  a knee immobilizer.  He tolerated the procedure well and was taken  to the recovery room in satisfactory condition without complications.  Estimated blood loss perhaps 100 mL.  No blood replacement,           ______________________________  Tarri Glenn, M.D.     JA/MEDQ  D:  07/22/2005  T:  07/22/2005  Job:  [id number]

## 2010-09-04 ENCOUNTER — Encounter (HOSPITAL_COMMUNITY): Payer: Medicare Other

## 2010-09-10 ENCOUNTER — Encounter (HOSPITAL_COMMUNITY): Payer: Medicare Other | Attending: Nephrology

## 2010-09-10 ENCOUNTER — Other Ambulatory Visit: Payer: Self-pay | Admitting: Nephrology

## 2010-09-10 DIAGNOSIS — N183 Chronic kidney disease, stage 3 unspecified: Secondary | ICD-10-CM | POA: Insufficient documentation

## 2010-09-10 DIAGNOSIS — D509 Iron deficiency anemia, unspecified: Secondary | ICD-10-CM | POA: Insufficient documentation

## 2010-09-10 LAB — COMPREHENSIVE METABOLIC PANEL
ALT: 38 U/L (ref 0–53)
AST: 40 U/L — ABNORMAL HIGH (ref 0–37)
Albumin: 3.6 g/dL (ref 3.5–5.2)
Alkaline Phosphatase: 105 U/L (ref 39–117)
BUN: 27 mg/dL — ABNORMAL HIGH (ref 6–23)
CO2: 25 mEq/L (ref 19–32)
Calcium: 9 mg/dL (ref 8.4–10.5)
Chloride: 106 mEq/L (ref 96–112)
Creatinine, Ser: 1.71 mg/dL — ABNORMAL HIGH (ref 0.4–1.5)
GFR calc Af Amer: 47 mL/min — ABNORMAL LOW (ref >60)
GFR calc non Af Amer: 39 mL/min — ABNORMAL LOW (ref >60)
Glucose, Bld: 129 mg/dL — ABNORMAL HIGH (ref 70–99)
Potassium: 4.7 mEq/L (ref 3.5–5.1)
Sodium: 139 mEq/L (ref 135–145)
Total Bilirubin: 0.2 mg/dL — ABNORMAL LOW (ref 0.3–1.2)
Total Protein: 7 g/dL (ref 6.0–8.3)

## 2010-09-13 ENCOUNTER — Encounter (HOSPITAL_COMMUNITY): Payer: Medicare Other | Attending: Nephrology

## 2010-09-13 DIAGNOSIS — D509 Iron deficiency anemia, unspecified: Secondary | ICD-10-CM | POA: Insufficient documentation

## 2010-09-13 DIAGNOSIS — N183 Chronic kidney disease, stage 3 unspecified: Secondary | ICD-10-CM | POA: Insufficient documentation

## 2010-09-25 ENCOUNTER — Other Ambulatory Visit: Payer: Self-pay | Admitting: Internal Medicine

## 2010-09-25 DIAGNOSIS — R634 Abnormal weight loss: Secondary | ICD-10-CM

## 2010-09-25 DIAGNOSIS — F0390 Unspecified dementia without behavioral disturbance: Secondary | ICD-10-CM

## 2010-09-30 ENCOUNTER — Ambulatory Visit
Admission: RE | Admit: 2010-09-30 | Discharge: 2010-09-30 | Disposition: A | Discharge status: Home / Self Care | Payer: Medicare Other | Source: Ambulatory Visit | Attending: Internal Medicine | Admitting: Internal Medicine

## 2010-09-30 DIAGNOSIS — R634 Abnormal weight loss: Secondary | ICD-10-CM

## 2010-09-30 DIAGNOSIS — F0390 Unspecified dementia without behavioral disturbance: Secondary | ICD-10-CM

## 2010-10-11 ENCOUNTER — Encounter: Payer: Self-pay | Admitting: Internal Medicine

## 2010-10-15 ENCOUNTER — Encounter: Payer: Self-pay | Admitting: Internal Medicine

## 2010-10-15 ENCOUNTER — Ambulatory Visit (INDEPENDENT_AMBULATORY_CARE_PROVIDER_SITE_OTHER): Payer: Medicare Other | Admitting: Internal Medicine

## 2010-10-15 VITALS — BP 136/52 | HR 53 | Resp 16 | Ht 67.0 in | Wt 177.0 lb

## 2010-10-15 DIAGNOSIS — I498 Other specified cardiac arrhythmias: Secondary | ICD-10-CM | POA: Insufficient documentation

## 2010-10-15 DIAGNOSIS — I2510 Atherosclerotic heart disease of native coronary artery without angina pectoris: Secondary | ICD-10-CM

## 2010-10-15 DIAGNOSIS — I739 Peripheral vascular disease, unspecified: Secondary | ICD-10-CM

## 2010-10-15 DIAGNOSIS — I10 Essential (primary) hypertension: Secondary | ICD-10-CM

## 2010-10-15 DIAGNOSIS — E785 Hyperlipidemia, unspecified: Secondary | ICD-10-CM

## 2010-10-15 DIAGNOSIS — R001 Bradycardia, unspecified: Secondary | ICD-10-CM | POA: Insufficient documentation

## 2010-10-15 NOTE — Assessment & Plan Note (Signed)
BP upper end of normal. Reluctant to consolidating ACE-I and ARB. Will cut lopressor to 50 bid due to bradycardia and fatigue. May need to titrate other meds if BP creeps up.

## 2010-10-15 NOTE — Patient Instructions (Signed)
Decrease Metoprolol to 50 mg Twice daily   Your physician recommends that you return for a FASTING lipid profile: Wed 10/23/10  Your physician recommends that you schedule a follow-up appointment in: 4 months.

## 2010-10-15 NOTE — Assessment & Plan Note (Signed)
Will decrease lopressor to 50 bid.

## 2010-10-15 NOTE — Assessment & Plan Note (Signed)
Reviewed results of ab u/s. Looks good.

## 2010-10-15 NOTE — Progress Notes (Signed)
PCP: Dr. Posey Pronto at [facility]  HPI:  Mr. Barnett is a delightful 77 y/o male with multiple medical problems including DM2, HTN, HL, mild renal insufficiency, RAS s/p stenting of L renal artery in 2008, CAD s/p multiple PCIs on OM-1 (last cath 2007). Previously followed by Dr. Claiborne Billings. Transferred his care to [place] in January of this year.  Last Myoview 2010. Ok by his report. Ab u/s in 2/12: showed normal renals and aorta.  At last visit, I suggested consolidating ACE-I and ARB to one agent and switching simva 80 to atorvastatin due to [facility] Box warning but he was reluctant to change either.   Returns for routine f/u. Doing well. Relatively active around the house- taking care of sick wife.  but does not exercise regularly. No CP or SOB. No HF. BP well contorlled. Takes all meds as prescribed without problem.   ROS: All systems negative except as listed in HPI, PMH and Problem List.  Past Medical History  Diagnosis Date  . Renal insufficiency   . CAD (coronary artery disease)     PTCA of OM in 1998, BMS OM in 2000, Cath 9/07 LM ok LAD ok. LCX 95% in OM prior to previous stent RCA. nondominant normal EF normal. Cypher DES to OM 2007 (PLACED PROXIAMAL TO PREVIOUS STENT)  . DM type 2 (diabetes mellitus, type 2)   . HLD (hyperlipidemia)   . PVD (peripheral vascular disease)   . HTN (hypertension)     Current Outpatient Prescriptions  Medication Sig Dispense Refill  . allopurinol (ZYLOPRIM) 100 MG tablet Take 100 mg by mouth daily.        [name] aspirin 325 MG tablet Take 325 mg by mouth daily.        . citalopram (CELEXA) 20 MG tablet Take 20 mg by mouth 2 (two) times daily.        . clopidogrel (PLAVIX) 75 MG tablet Take 75 mg by mouth daily.        . furosemide (LASIX) 40 MG tablet Take 40 mg by mouth daily.        [name] lisinopril (PRINIVIL,ZESTRIL) 10 MG tablet Take 10 mg by mouth daily.        [name] losartan (COZAAR) 25 MG tablet Take 25 mg by mouth daily.        . metFORMIN (GLUCOPHAGE)  500 MG tablet Take 500 mg by mouth daily with breakfast.        . metoprolol (TOPROL-XL) 100 MG 24 hr tablet Take 100 mg by mouth 2 (two) times daily.       . nitroGLYCERIN (NITROSTAT) 0.4 MG SL tablet Place 0.4 mg under the tongue every 5 (five) minutes as needed. Up to 3 doses       . simvastatin (ZOCOR) 80 MG tablet Take 80 mg by mouth at bedtime.           PHYSICAL EXAM: Filed Vitals:   10/15/10 0941  BP: 136/52  Pulse: 53  Resp: 16   General:  Elderly: well appearing. no resp difficulty HEENT: normal Neck: supple. no JVD. Carotids 2+ bilat; no bruits. No lymphadenopathy or thryomegaly appreciated. Cor: PMI nondisplaced. Regular rate & rhythm. No rubs, murmur. +s4 Lungs: clear Abdomen: soft, nontender, nondistended. No hepatosplenomegaly. No bruits or masses. Good bowel sounds. Extremities: no cyanosis, clubbing, rash, edema Neuro: alert & orientedx3, cranial nerves grossly intact. moves all 4 extremities w/o difficulty. affect pleasant   ECG: Sinus brady 53. 1AVB (230 ms) Poor R wave progression (  no change from previous)    ASSESSMENT & PLAN:

## 2010-10-15 NOTE — Assessment & Plan Note (Signed)
No evidence of ischemia. Decrease ASA to 81 qd.

## 2010-10-15 NOTE — Assessment & Plan Note (Signed)
Goal LDL < 70. Continue simv 80 (has been stable on current dose and does not want to change). Check lipids/cmet next week.

## 2010-10-23 ENCOUNTER — Other Ambulatory Visit (INDEPENDENT_AMBULATORY_CARE_PROVIDER_SITE_OTHER): Payer: Medicare Other | Admitting: *Deleted

## 2010-10-23 DIAGNOSIS — I2510 Atherosclerotic heart disease of native coronary artery without angina pectoris: Secondary | ICD-10-CM

## 2010-10-23 DIAGNOSIS — E785 Hyperlipidemia, unspecified: Secondary | ICD-10-CM

## 2010-10-23 DIAGNOSIS — I10 Essential (primary) hypertension: Secondary | ICD-10-CM

## 2010-10-23 LAB — BASIC METABOLIC PANEL
BUN: 30 mg/dL — ABNORMAL HIGH (ref 6–23)
CO2: 28 mEq/L (ref 19–32)
Calcium: 8.9 mg/dL (ref 8.4–10.5)
Chloride: 109 mEq/L (ref 96–112)
Creatinine, Ser: 1.6 mg/dL — ABNORMAL HIGH (ref 0.4–1.5)
GFR: 55.65 mL/min — ABNORMAL LOW (ref >60.00)
Glucose, Bld: 91 mg/dL (ref 70–99)
Potassium: 4.6 mEq/L (ref 3.5–5.1)
Sodium: 140 mEq/L (ref 135–145)

## 2010-10-23 LAB — LIPID PANEL
Cholesterol: 144 mg/dL (ref 0–200)
HDL: 52.7 mg/dL (ref >39.00)
LDL Cholesterol: 80 mg/dL (ref 0–99)
Total CHOL/HDL Ratio: 3
Triglycerides: 56 mg/dL (ref 0.0–149.0)
VLDL: 11.2 mg/dL (ref 0.0–40.0)

## 2010-10-23 LAB — HEPATIC FUNCTION PANEL
ALT: 29 U/L (ref 0–53)
AST: 26 U/L (ref 0–37)
Albumin: 4.3 g/dL (ref 3.5–5.2)
Alkaline Phosphatase: 83 U/L (ref 39–117)
Bilirubin, Direct: 0.1 mg/dL (ref 0.0–0.3)
Total Bilirubin: 0.4 mg/dL (ref 0.3–1.2)
Total Protein: 7.2 g/dL (ref 6.0–8.3)

## 2010-11-01 ENCOUNTER — Encounter: Payer: Self-pay | Admitting: *Deleted

## 2011-01-20 ENCOUNTER — Other Ambulatory Visit: Payer: Self-pay | Admitting: Oncology

## 2011-01-20 ENCOUNTER — Encounter (HOSPITAL_BASED_OUTPATIENT_CLINIC_OR_DEPARTMENT_OTHER): Payer: Medicare Other | Admitting: Oncology

## 2011-01-20 DIAGNOSIS — D472 Monoclonal gammopathy: Secondary | ICD-10-CM

## 2011-01-20 LAB — CBC & DIFF AND RETIC
BASO%: 0.2 % (ref 0.0–2.0)
Basophils Absolute: 0 10*3/uL (ref 0.0–0.1)
EOS%: 6.2 % (ref 0.0–7.0)
Eosinophils Absolute: 0.3 10*3/uL (ref 0.0–0.5)
HCT: 34.9 % — ABNORMAL LOW (ref 38.4–49.9)
HGB: 11.8 g/dL — ABNORMAL LOW (ref 13.0–17.1)
Immature Retic Fract: 6.9 % (ref 3.00–10.60)
LYMPH%: 21.4 % (ref 14.0–49.0)
MCH: 30.9 pg (ref 27.2–33.4)
MCHC: 33.8 g/dL (ref 32.0–36.0)
MCV: 91.4 fL (ref 79.3–98.0)
MONO#: 0.4 10*3/uL (ref 0.1–0.9)
MONO%: 8.7 % (ref 0.0–14.0)
NEUT#: 2.9 10*3/uL (ref 1.5–6.5)
NEUT%: 63.5 % (ref 39.0–75.0)
Platelets: 311 10*3/uL (ref 140–400)
RBC: 3.82 10*6/uL — ABNORMAL LOW (ref 4.20–5.82)
RDW: 13.1 % (ref 11.0–14.6)
Retic %: 1.34 % (ref 0.80–1.80)
Retic Ct Abs: 51.19 10*3/uL (ref 34.80–93.90)
WBC: 4.5 10*3/uL (ref 4.0–10.3)
lymph#: 1 10*3/uL (ref 0.9–3.3)

## 2011-01-21 LAB — IRON AND TIBC
%SAT: 29 % (ref 20–55)
Iron: 66 ug/dL (ref 42–165)
TIBC: 230 ug/dL (ref 215–435)
UIBC: 164 ug/dL (ref 125–400)

## 2011-01-21 LAB — FERRITIN: Ferritin: 492 ng/mL — ABNORMAL HIGH (ref 22–322)

## 2011-01-21 LAB — KAPPA/LAMBDA LIGHT CHAINS
Kappa free light chain: 3.08 mg/dL — ABNORMAL HIGH (ref 0.33–1.94)
Kappa:Lambda Ratio: 1.14 (ref 0.26–1.65)
Lambda Free Lght Chn: 2.7 mg/dL — ABNORMAL HIGH (ref 0.57–2.63)

## 2011-01-21 LAB — IGG: IgG (Immunoglobin G), Serum: 1410 mg/dL (ref 650–1600)

## 2011-06-16 ENCOUNTER — Ambulatory Visit (HOSPITAL_COMMUNITY)
Admission: RE | Admit: 2011-06-16 | Discharge: 2011-06-16 | Disposition: A | Discharge status: Home / Self Care | Payer: Medicare Other | Source: Ambulatory Visit | Attending: Internal Medicine | Admitting: Internal Medicine

## 2011-06-16 VITALS — BP 130/62 | HR 53 | Wt 189.2 lb

## 2011-06-16 DIAGNOSIS — N289 Disorder of kidney and ureter, unspecified: Secondary | ICD-10-CM | POA: Insufficient documentation

## 2011-06-16 DIAGNOSIS — Z951 Presence of aortocoronary bypass graft: Secondary | ICD-10-CM | POA: Insufficient documentation

## 2011-06-16 DIAGNOSIS — I10 Essential (primary) hypertension: Secondary | ICD-10-CM | POA: Insufficient documentation

## 2011-06-16 DIAGNOSIS — I739 Peripheral vascular disease, unspecified: Secondary | ICD-10-CM | POA: Insufficient documentation

## 2011-06-16 DIAGNOSIS — I2510 Atherosclerotic heart disease of native coronary artery without angina pectoris: Secondary | ICD-10-CM | POA: Insufficient documentation

## 2011-06-16 DIAGNOSIS — H9190 Unspecified hearing loss, unspecified ear: Secondary | ICD-10-CM | POA: Insufficient documentation

## 2011-06-16 DIAGNOSIS — E119 Type 2 diabetes mellitus without complications: Secondary | ICD-10-CM | POA: Insufficient documentation

## 2011-06-16 DIAGNOSIS — E785 Hyperlipidemia, unspecified: Secondary | ICD-10-CM | POA: Insufficient documentation

## 2011-06-16 NOTE — Assessment & Plan Note (Addendum)
Tolerating Simvastatin.   Attending: Goal LDL< 70. Followed by PCP. Continue statin.

## 2011-06-16 NOTE — Patient Instructions (Signed)
Please send a copy of your cholesterol results . Fax [phone]  Follow up in 6 months.

## 2011-06-16 NOTE — Progress Notes (Signed)
Patient ID: Kevin Schwartz, male   DOB: 09-Mar-1931, 78 y.o.   MRN: [id number] PCP: Dr.Reid  at [facility]  HPI:  Kevin Schwartz is a delightful 78 y/o male with multiple medical problems including DM2, HTN, HL, mild renal insufficiency, RAS s/p stenting of L renal artery in 2008, CAD s/p multiple PCIs on OM-1 (last cath 2007). Previously followed by Dr. Claiborne Billings. Transferred his care to [place] in January of this year.  Last Myoview 2010. Ok by his report. Ab u/s in 2/12: showed normal renals and aorta.  At last visit, I suggested consolidating ACE-I and ARB to one agent and switching simva 80 to atorvastatin due to [facility] Box warning but he was reluctant to change either.   He is here for follow up. Denies SOB/CP. Walking daily. He has not required any Lasix over the last month. Last visit lopressor decreased to 50 mg BID. More energy after medication decreased.  Cholesterol followed by PCP.   ROS: All systems negative except as listed in HPI, PMH and Problem List.  Past Medical History  Diagnosis Date  . Renal insufficiency   . CAD (coronary artery disease)     PTCA of OM in 1998, BMS OM in 2000, Cath 9/07 LM ok LAD ok. LCX 95% in OM prior to previous stent RCA. nondominant normal EF normal. Cypher DES to OM 2007 (PLACED PROXIAMAL TO PREVIOUS STENT)  . DM type 2 (diabetes mellitus, type 2)   . HLD (hyperlipidemia)   . PVD (peripheral vascular disease)   . HTN (hypertension)     Current Outpatient Prescriptions  Medication Sig Dispense Refill  . allopurinol (ZYLOPRIM) 100 MG tablet Take 100 mg by mouth daily.        [name] aspirin 81 MG tablet Take 81 mg by mouth daily.      . clopidogrel (PLAVIX) 75 MG tablet Take 75 mg by mouth daily.        . furosemide (LASIX) 40 MG tablet Take 40 mg by mouth daily.        [name] lisinopril (PRINIVIL,ZESTRIL) 10 MG tablet Take 10 mg by mouth 2 (two) times daily.       [name] losartan (COZAAR) 25 MG tablet Take 50 mg by mouth daily.       . metFORMIN  (GLUCOPHAGE) 500 MG tablet Take 500 mg by mouth daily with breakfast.        . metoprolol (TOPROL-XL) 100 MG 24 hr tablet Take 50 mg by mouth 2 (two) times daily.       . nitroGLYCERIN (NITROSTAT) 0.4 MG SL tablet Place 0.4 mg under the tongue every 5 (five) minutes as needed. Up to 3 doses       . simvastatin (ZOCOR) 80 MG tablet Take 80 mg by mouth at bedtime.           PHYSICAL EXAM: Filed Vitals:   06/16/11 0917  BP: 130/62  Pulse: 53   General:  Elderly: well appearing. no resp difficulty HEENT: normal Neck: supple. no JVD. Carotids 2+ bilat; no bruits. No lymphadenopathy or thryomegaly appreciated. Cor: PMI nondisplaced. Regular rate & rhythm. No rubs, AS 1/6 murmur.  Lungs: clear Abdomen: soft, nontender, nondistended. No hepatosplenomegaly. No bruits or masses. Good bowel sounds. Extremities: no cyanosis, clubbing, rash, edema Neuro: alert & orientedx3, cranial nerves grossly intact. moves all 4 extremities w/o difficulty. affect pleasant  ASSESSMENT & PLAN:

## 2011-06-16 NOTE — Assessment & Plan Note (Addendum)
No evidence of ischemia.   Patient seen and examined with Darrick Grinder, NP. We discussed all aspects of the encounter. I agree with the assessment and plan as stated above. He is doing well. No evidence of ischemia. Soft aortic sclerosis murmur on exam but s2 crisp.

## 2011-06-16 NOTE — Assessment & Plan Note (Signed)
SBP ok.  Follow up in 6 months

## 2011-06-19 ENCOUNTER — Encounter: Payer: Self-pay | Admitting: Internal Medicine

## 2011-07-10 ENCOUNTER — Telehealth: Payer: Self-pay | Admitting: Oncology

## 2011-07-10 NOTE — Telephone Encounter (Signed)
called pts home lmovm for appts on 04/12-04/19

## 2011-07-25 ENCOUNTER — Other Ambulatory Visit (HOSPITAL_BASED_OUTPATIENT_CLINIC_OR_DEPARTMENT_OTHER): Payer: Medicare Other | Admitting: Lab

## 2011-07-25 DIAGNOSIS — D472 Monoclonal gammopathy: Secondary | ICD-10-CM

## 2011-07-25 LAB — CBC WITH DIFFERENTIAL/PLATELET
BASO%: 0.8 % (ref 0.0–2.0)
Basophils Absolute: 0 10*3/uL (ref 0.0–0.1)
EOS%: 4.9 % (ref 0.0–7.0)
Eosinophils Absolute: 0.2 10*3/uL (ref 0.0–0.5)
HCT: 34 % — ABNORMAL LOW (ref 38.4–49.9)
HGB: 11.4 g/dL — ABNORMAL LOW (ref 13.0–17.1)
LYMPH%: 21.5 % (ref 14.0–49.0)
MCH: 31.9 pg (ref 27.2–33.4)
MCHC: 33.7 g/dL (ref 32.0–36.0)
MCV: 94.7 fL (ref 79.3–98.0)
MONO#: 0.6 10*3/uL (ref 0.1–0.9)
MONO%: 13.1 % (ref 0.0–14.0)
NEUT#: 2.6 10*3/uL (ref 1.5–6.5)
NEUT%: 59.7 % (ref 39.0–75.0)
Platelets: 256 10*3/uL (ref 140–400)
RBC: 3.59 10*6/uL — ABNORMAL LOW (ref 4.20–5.82)
RDW: 13.5 % (ref 11.0–14.6)
WBC: 4.3 10*3/uL (ref 4.0–10.3)
lymph#: 0.9 10*3/uL (ref 0.9–3.3)
nRBC: 0 % (ref 0–0)

## 2011-07-28 LAB — COMPREHENSIVE METABOLIC PANEL
ALT: 17 U/L (ref 0–53)
AST: 24 U/L (ref 0–37)
Albumin: 4.1 g/dL (ref 3.5–5.2)
Alkaline Phosphatase: 73 U/L (ref 39–117)
BUN: 30 mg/dL — ABNORMAL HIGH (ref 6–23)
CO2: 24 mEq/L (ref 19–32)
Calcium: 8.8 mg/dL (ref 8.4–10.5)
Chloride: 108 mEq/L (ref 96–112)
Creatinine, Ser: 1.49 mg/dL — ABNORMAL HIGH (ref 0.50–1.35)
Glucose, Bld: 98 mg/dL (ref 70–99)
Potassium: 4.2 mEq/L (ref 3.5–5.3)
Sodium: 140 mEq/L (ref 135–145)
Total Bilirubin: 0.3 mg/dL (ref 0.3–1.2)
Total Protein: 7 g/dL (ref 6.0–8.3)

## 2011-07-28 LAB — KAPPA/LAMBDA LIGHT CHAINS
Kappa free light chain: 2.53 mg/dL — ABNORMAL HIGH (ref 0.33–1.94)
Kappa:Lambda Ratio: 1.05 (ref 0.26–1.65)
Lambda Free Lght Chn: 2.42 mg/dL (ref 0.57–2.63)

## 2011-07-28 LAB — LACTATE DEHYDROGENASE: LDH: 103 U/L (ref 94–250)

## 2011-07-28 LAB — IGG: IgG (Immunoglobin G), Serum: 1550 mg/dL (ref 650–1600)

## 2011-07-30 ENCOUNTER — Ambulatory Visit: Payer: Medicare Other

## 2011-08-01 ENCOUNTER — Ambulatory Visit (HOSPITAL_BASED_OUTPATIENT_CLINIC_OR_DEPARTMENT_OTHER): Payer: Medicare Other | Admitting: Oncology

## 2011-08-01 ENCOUNTER — Encounter: Payer: Self-pay | Admitting: Oncology

## 2011-08-01 VITALS — BP 148/83 | HR 62 | Temp 97.2°F | Ht 67.0 in | Wt 187.7 lb

## 2011-08-01 DIAGNOSIS — Z955 Presence of coronary angioplasty implant and graft: Secondary | ICD-10-CM | POA: Insufficient documentation

## 2011-08-01 DIAGNOSIS — D649 Anemia, unspecified: Secondary | ICD-10-CM

## 2011-08-01 DIAGNOSIS — D472 Monoclonal gammopathy: Secondary | ICD-10-CM

## 2011-08-01 DIAGNOSIS — M109 Gout, unspecified: Secondary | ICD-10-CM | POA: Insufficient documentation

## 2011-08-01 DIAGNOSIS — E1122 Type 2 diabetes mellitus with diabetic chronic kidney disease: Secondary | ICD-10-CM

## 2011-08-01 DIAGNOSIS — I10 Essential (primary) hypertension: Secondary | ICD-10-CM

## 2011-08-01 DIAGNOSIS — N289 Disorder of kidney and ureter, unspecified: Secondary | ICD-10-CM

## 2011-08-01 HISTORY — DX: Monoclonal gammopathy: D47.2

## 2011-08-01 HISTORY — DX: Presence of coronary angioplasty implant and graft: Z95.5

## 2011-08-01 LAB — UIFE/LIGHT CHAINS/TP QN, 24-HR UR
Albumin, U: DETECTED
Alpha 1, Urine: DETECTED — AB
Alpha 2, Urine: DETECTED — AB
Beta, Urine: DETECTED — AB
Free Kappa Lt Chains,Ur: 6.27 mg/dL — ABNORMAL HIGH (ref 0.14–2.42)
Free Kappa/Lambda Ratio: 15.29 ratio — ABNORMAL HIGH (ref 2.04–10.37)
Free Lambda Excretion/Day: 6.15 mg/d
Free Lambda Lt Chains,Ur: 0.41 mg/dL (ref 0.02–0.67)
Free Lt Chn Excr Rate: 94.05 mg/d
Gamma Globulin, Urine: DETECTED — AB
Time: 24 hours
Total Protein, Urine-Ur/day: 425 mg/d — ABNORMAL HIGH (ref 10–140)
Total Protein, Urine: 28.3 mg/dL
Volume, Urine: 1500 mL

## 2011-08-01 NOTE — Progress Notes (Signed)
Hematology and Oncology Follow Up Visit  Kevin Schwartz [id number] 1930-07-17 78 y.o. 08/01/2011 6:16 PM   Principle Diagnosis: Encounter Diagnoses  Name Primary?  [name] MGUS (monoclonal gammopathy of unknown significance) Yes  . Normochromic normocytic anemia      Interim History:   follow-up visit for this pleasant 78 year old man with IgG monoclonal gammopathy of undetermined significance.  We have followed him in this office now for 11 years.  Immunoglobulin levels have been stable.  Back in February 2002 at time of initial presentation, IgG paraprotein was 3880 mg% which is the highest value recorded since that time.. There have been minor fluctuations that overall his paraprotein level has remained very stable over time. Most recent value 1550 on 07/25/2011 with normal range up to 1600 mg percent. Initial evaluation included a metastatic bone survey which was normal and a bone marrow biopsy which showed only 8% plasma cells. He has chronic renal insufficiency. Creatinine  range between 1.4 and 1.9. With current value 1.5 on April 12. He has not had significant proteinuria in the past. A 24-hour urine total protein done last April 2012 56 mg. There is an increase on the current specimen done 07/30/2011 up to 425 mg but no monoclonal free light chains seen on immunofixation electrophoresis. Serum kappa to lambda ratio remains normal consistent with medical renal disease. Total kappa light chains only borderline elevated with current value 2.53 mg percent.   He has had no interim medical problems.   Medications: reviewed  Allergies: No Known Allergies  Review of Systems: Constitutional:  No constitutional symptoms  Respiratory: No cough or dyspnea Cardiovascular:  No chest pain or palpitations Gastrointestinal: No change in bowel habit Genito-Urinary: No dysuria or frequency Musculoskeletal: No bone pain Neurologic: No headache or change in vision Skin: No rash or  ecchymosis Remaining ROS negative.  Physical Exam: Blood pressure 148/83, pulse 62, temperature 97.2 F (36.2 C), temperature source Oral, height 5\' 7"  (1.702 m), weight 187 lb 11.2 oz (85.14 kg). Wt Readings from Last 3 Encounters:  08/01/11 187 lb 11.2 oz (85.14 kg)  06/16/11 189 lb 4 oz (85.843 kg)  10/15/10 177 lb (80.287 kg)     General appearance: Pleasant well-nourished African American man [name]:  Arcus senilis, no erythema or exudate in the pharynx Lymph nodes: No lymphadenopathy Breasts: Lungs: Clear to auscultation resonant to percussion Heart: Regular rhythm no murmur Abdomen: Soft nontender no mass no organomegaly Extremities: No edema no calf tenderness Vascular: No cyanosis Neurologic: Motor strength 5 over 5, reflexes absent left knee due to previous knee replacement surgery and slight decreased strength in his left foot. Sensation slightly decreased to tuning fork exam over the fingertips Skin: No rash or ecchymosis  Lab Results: Lab Results  Component Value Date   WBC 4.3 07/25/2011   HGB 11.4* 07/25/2011   HCT 34.0* 07/25/2011   MCV 94.7 07/25/2011   PLT 256 07/25/2011     Chemistry      Component Value Date/Time   NA 140 07/25/2011 1428   K 4.2 07/25/2011 1428   CL 108 07/25/2011 1428   CO2 24 07/25/2011 1428   BUN 30* 07/25/2011 1428   CREATININE 1.49* 07/25/2011 1428   CREATININE 1.52* 07/22/2007 0906      Component Value Date/Time   CALCIUM 8.8 07/25/2011 1428   ALKPHOS 73 07/25/2011 1428   AST 24 07/25/2011 1428   ALT 17 07/25/2011 1428   BILITOT 0.3 07/25/2011 1428       Impression and  Plan:  #1. IgG kappa monoclonal gammopathy of undetermined significance. Lab values have remained stable now for over 11 years. Continue annual surveillance.  #2. Chronic renal insufficiency  #3. Essential hypertension.  #4. Coronary artery disease status post MI status post coronary stenting  #5. Type 2 diabetes  #6. Gout  #7. Hyperlipidemia  #8. Chronic  normochromic anemia related to chronic renal insufficiency   CC:. [facility] senior Medicine   Annia Belt, MD 4/19/20136:16 PM

## 2011-08-05 ENCOUNTER — Telehealth: Payer: Self-pay | Admitting: Oncology

## 2011-08-05 NOTE — Telephone Encounter (Signed)
Called pt, left message regarding appt for April 2014 lab and MD

## 2012-04-26 ENCOUNTER — Encounter: Payer: Self-pay | Admitting: Internal Medicine

## 2012-06-21 ENCOUNTER — Emergency Department (HOSPITAL_COMMUNITY)
Admission: EM | Admit: 2012-06-21 | Discharge: 2012-06-21 | Disposition: A | Discharge status: Home / Self Care | Payer: Medicare Other | Attending: Emergency Medicine | Admitting: Emergency Medicine

## 2012-06-21 ENCOUNTER — Encounter (HOSPITAL_COMMUNITY): Payer: Self-pay | Admitting: *Deleted

## 2012-06-21 ENCOUNTER — Emergency Department (HOSPITAL_COMMUNITY): Payer: Medicare Other

## 2012-06-21 DIAGNOSIS — Z9861 Coronary angioplasty status: Secondary | ICD-10-CM | POA: Insufficient documentation

## 2012-06-21 DIAGNOSIS — Z87448 Personal history of other diseases of urinary system: Secondary | ICD-10-CM | POA: Insufficient documentation

## 2012-06-21 DIAGNOSIS — I129 Hypertensive chronic kidney disease with stage 1 through stage 4 chronic kidney disease, or unspecified chronic kidney disease: Secondary | ICD-10-CM | POA: Insufficient documentation

## 2012-06-21 DIAGNOSIS — Z79899 Other long term (current) drug therapy: Secondary | ICD-10-CM | POA: Insufficient documentation

## 2012-06-21 DIAGNOSIS — Z8639 Personal history of other endocrine, nutritional and metabolic disease: Secondary | ICD-10-CM | POA: Insufficient documentation

## 2012-06-21 DIAGNOSIS — M79642 Pain in left hand: Secondary | ICD-10-CM

## 2012-06-21 DIAGNOSIS — M79609 Pain in unspecified limb: Secondary | ICD-10-CM | POA: Insufficient documentation

## 2012-06-21 DIAGNOSIS — Z87891 Personal history of nicotine dependence: Secondary | ICD-10-CM | POA: Insufficient documentation

## 2012-06-21 DIAGNOSIS — E119 Type 2 diabetes mellitus without complications: Secondary | ICD-10-CM | POA: Insufficient documentation

## 2012-06-21 DIAGNOSIS — E1129 Type 2 diabetes mellitus with other diabetic kidney complication: Secondary | ICD-10-CM | POA: Insufficient documentation

## 2012-06-21 DIAGNOSIS — Z8679 Personal history of other diseases of the circulatory system: Secondary | ICD-10-CM | POA: Insufficient documentation

## 2012-06-21 DIAGNOSIS — N182 Chronic kidney disease, stage 2 (mild): Secondary | ICD-10-CM | POA: Insufficient documentation

## 2012-06-21 DIAGNOSIS — Z862 Personal history of diseases of the blood and blood-forming organs and certain disorders involving the immune mechanism: Secondary | ICD-10-CM | POA: Insufficient documentation

## 2012-06-21 DIAGNOSIS — Z7982 Long term (current) use of aspirin: Secondary | ICD-10-CM | POA: Insufficient documentation

## 2012-06-21 DIAGNOSIS — E785 Hyperlipidemia, unspecified: Secondary | ICD-10-CM | POA: Insufficient documentation

## 2012-06-21 DIAGNOSIS — I2510 Atherosclerotic heart disease of native coronary artery without angina pectoris: Secondary | ICD-10-CM | POA: Insufficient documentation

## 2012-06-21 DIAGNOSIS — IMO0001 Reserved for inherently not codable concepts without codable children: Secondary | ICD-10-CM | POA: Insufficient documentation

## 2012-06-21 MED ORDER — NAPROXEN 375 MG PO TABS: 375.0000 mg | ORAL_TABLET | Freq: Two times a day (BID) | ORAL | Status: DC

## 2012-06-21 MED ORDER — OXYCODONE-ACETAMINOPHEN 5-325 MG PO TABS: 1.0000 | ORAL_TABLET | ORAL | Status: DC | PRN

## 2012-06-21 NOTE — ED Provider Notes (Signed)
History  This chart was scribed for Delora Fuel, MD by Roe Coombs, ED Scribe. The patient was seen in room TR05C/TR05C. Patient's care was started at 1702.   CSN: [id number]  Arrival date & time 06/21/12  1336   First MD Initiated Contact with Patient 06/21/12 1702      Chief Complaint  Patient presents with  . Hand Pain    The history is provided by the patient. No language interpreter was used.    HPI Comments: Kevin Schwartz is a 79 y.o. male with history of diabetes, coronary artery disease and monoclonal gammopathy who presents to the Emergency Department complaining of moderate, constant left hand pain with associated swelling that began 2.5 days ago. Patient rates pain as 9/10. Pain is worse with range of motion of fingers. He has been applying rubbing alcohol to area with mild relief. He denies any obvious injury or trauma to the hand. There is no numbness, weakness or tingling of the left upper extremity. No fever or nausea.   Past Medical History  Diagnosis Date  . Renal insufficiency   . CAD (coronary artery disease)     PTCA of OM in 1998, BMS OM in 2000, Cath 9/07 LM ok LAD ok. LCX 95% in OM prior to previous stent RCA. nondominant normal EF normal. Cypher DES to OM 2007 (PLACED PROXIAMAL TO PREVIOUS STENT)  . DM type 2 (diabetes mellitus, type 2)   . HLD (hyperlipidemia)   . PVD (peripheral vascular disease)   . HTN (hypertension)   . MGUS (monoclonal gammopathy of unknown significance) 08/01/2011    IgG kappa dx 2002 8% plasma cells in bone marrow; no lesions on bone X-rays;  . S/P coronary artery stent placement 08/01/2011  . 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 Carotid stent    . Coronary angioplasty    . Knee arthroplasty      total  . Joint replacement      left knee    Family History  Problem Relation Age of Onset  . Diabetes    . Coronary artery disease    . Cancer      History   Substance Use Topics  . Smoking status: Former Smoker    Quit date: 10/15/2002  . Smokeless tobacco: Never Used  . Alcohol Use: No      Review of Systems  Constitutional: Negative for fever.  Gastrointestinal: Negative for nausea.  Musculoskeletal: Positive for myalgias.  Neurological: Negative for weakness and numbness.    Allergies  Review of patient's allergies indicates no known allergies.  Home Medications   Current Outpatient Rx  Name  Route  Sig  Dispense  Refill  . allopurinol (ZYLOPRIM) 100 MG tablet   Oral   Take 100 mg by mouth daily.           [name] aspirin 81 MG tablet   Oral   Take 81 mg by mouth daily.         . citalopram (CELEXA) 20 MG tablet   Oral   Take 20 mg by mouth daily.          . clopidogrel (PLAVIX) 75 MG tablet   Oral   Take 75 mg by mouth daily.           [name] lisinopril (PRINIVIL,ZESTRIL) 10 MG tablet   Oral   Take 10 mg by mouth 2 (two) times daily.          [name]  losartan (COZAAR) 25 MG tablet   Oral   Take 50 mg by mouth daily.          . metFORMIN (GLUCOPHAGE) 500 MG tablet   Oral   Take 500 mg by mouth daily with breakfast.           . metoprolol (LOPRESSOR) 50 MG tablet   Oral   Take 50 mg by mouth 2 (two) times daily.          . nitroGLYCERIN (NITROSTAT) 0.4 MG SL tablet   Sublingual   Place 0.4 mg under the tongue every 5 (five) minutes as needed for chest pain. Up to 3 doses         . simvastatin (ZOCOR) 80 MG tablet   Oral   Take 40 mg by mouth at bedtime.            Triage Vitals: BP 166/71  Pulse 64  Temp(Src) 98.2 F (36.8 C)  Resp 18  SpO2 99%  Physical Exam  Constitutional: He is oriented to person, place, and time. He appears well-developed and well-nourished.  HENT:  Head: Normocephalic and atraumatic.  Neck: Normal range of motion. Neck supple.  Musculoskeletal:       Left hand: He exhibits swelling.  Left hand has mild swelling of the thenar eminence and the radial aspect of the  dorsum of the hand with mild erythema and slight warmth. Pain with active and passive ROM of the thumb. Pain with passive flexion of the second finger and active extension of the second finger. Neurovascularly intact distally.  Neurological: He is alert and oriented to person, place, and time.  Skin: Skin is warm and dry.  Psychiatric: He has a normal mood and affect. His behavior is normal.    ED Course  Procedures (including critical care time) DIAGNOSTIC STUDIES: Oxygen Saturation is 99% on room air, normal by my interpretation.    COORDINATION OF CARE: 5:06 PM- Patient informed of current plan for treatment and evaluation and agrees with plan at this time.     Dg Hand Complete Left  06/21/2012  *RADIOLOGY REPORT*  Clinical Data: Suspected insect bite on Friday night.  Left hand is swollen.  Redness, pain.  LEFT HAND - COMPLETE 3+ VIEW  Comparison: None.  Findings: There is diffuse soft tissue swelling of the hand. Degenerative changes are identified in the distal interphalangeal joints, metacarpal phalangeal joints.  There is moderate degenerative change in the first carpometacarpal joint.  No evidence for acute fracture or dislocation.  No radiopaque foreign bodies or soft tissue gas identified.  IMPRESSION:  1.  Soft tissue edema. 2.  Degenerative changes.   Original Report Authenticated By: Nolon Nations, M.D.      1. Pain of left hand       MDM  Left hand pain of uncertain cause. He does have a history of gout and there are aspects of his findings which would be consistent with gout although the location is somewhat unusual. He has mild kidney disease and is also diabetic. I feel the risk of prednisone is too great with his diabetes and we'll try him on intermediate dose of naproxen. He sent him a prescription for naproxen 375 mg and is also given a prescription for Percocet and he is referred to the on call hand surgery.      I personally performed the services described  in this documentation, which was scribed in my presence. The recorded information has been reviewed and is accurate.  Delora Fuel, MD [id number]
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
## 2012-06-21 NOTE — ED Notes (Signed)
Pt is here with left hand pain and some swelling.  No injury

## 2012-06-21 NOTE — ED Notes (Signed)
Pt c/o pain in left hand with swelling.  Pt denies any injury.  + radial pulse present.

## 2012-07-07 ENCOUNTER — Other Ambulatory Visit: Payer: Self-pay | Admitting: *Deleted

## 2012-07-07 DIAGNOSIS — E785 Hyperlipidemia, unspecified: Secondary | ICD-10-CM

## 2012-07-07 DIAGNOSIS — E1129 Type 2 diabetes mellitus with other diabetic kidney complication: Secondary | ICD-10-CM

## 2012-07-07 DIAGNOSIS — N183 Chronic kidney disease, stage 3 unspecified: Secondary | ICD-10-CM

## 2012-07-07 DIAGNOSIS — M109 Gout, unspecified: Secondary | ICD-10-CM

## 2012-07-19 ENCOUNTER — Telehealth: Payer: Self-pay | Admitting: Oncology

## 2012-07-19 ENCOUNTER — Ambulatory Visit (HOSPITAL_COMMUNITY)
Admission: RE | Admit: 2012-07-19 | Discharge: 2012-07-19 | Disposition: A | Discharge status: Home / Self Care | Payer: Medicare Other | Source: Ambulatory Visit | Attending: Internal Medicine | Admitting: Internal Medicine

## 2012-07-19 ENCOUNTER — Encounter (HOSPITAL_COMMUNITY): Payer: Self-pay

## 2012-07-19 VITALS — BP 138/78 | HR 67 | Wt 196.0 lb

## 2012-07-19 DIAGNOSIS — I10 Essential (primary) hypertension: Secondary | ICD-10-CM | POA: Insufficient documentation

## 2012-07-19 DIAGNOSIS — E782 Mixed hyperlipidemia: Secondary | ICD-10-CM | POA: Insufficient documentation

## 2012-07-19 DIAGNOSIS — I2510 Atherosclerotic heart disease of native coronary artery without angina pectoris: Secondary | ICD-10-CM | POA: Insufficient documentation

## 2012-07-19 DIAGNOSIS — E785 Hyperlipidemia, unspecified: Secondary | ICD-10-CM

## 2012-07-19 NOTE — Telephone Encounter (Signed)
Pt called and r/s lab to 07/23/12

## 2012-07-19 NOTE — Assessment & Plan Note (Addendum)
No evidence of ischemia. Continue current regimen.PCP following cholesterol. Follow in 6 months.   Patient seen and examined with Darrick Grinder, NP. We discussed all aspects of the encounter. I agree with the assessment and plan as stated above. Doing well. No signs ischemia. Continue current management.

## 2012-07-19 NOTE — Progress Notes (Signed)
Patient ID: Kevin Schwartz, male   DOB: 05/02/1930, 79 y.o.   MRN: [id number] PCP: Dr.Reid  at [facility]  HPI: Kevin Schwartz is a delightful 79 y/o male with multiple medical problems including DM2, HTN, HL, mild renal insufficiency, RAS s/p stenting of L renal artery in 2008, CAD s/p multiple PCIs on OM-1 (last cath 2007). Previously followed by Dr. Claiborne Billings. Transferred his care to [place] in January of this year.  Last Myoview 2010. Ok by his report. Ab u/s in 2/12: showed normal renals and aorta.  At last visit, I suggested consolidating ACE-I and ARB to one agent and switching simva 80 to atorvastatin due to [facility] Box warning but he was reluctant to change either.   He is here for follow up. Feels great. Denies SOB/CP/PND/Orthopnea. He has been gardening.  He is volunteering and taking senior citizens to doctor appointments. Cholesterol followed by PCP. Complaint with medications but has not had them this am.   ROS: All systems negative except as listed in HPI, PMH and Problem List.  Past Medical History  Diagnosis Date  . Renal insufficiency   . CAD (coronary artery disease)     PTCA of OM in 1998, BMS OM in 2000, Cath 9/07 LM ok LAD ok. LCX 95% in OM prior to previous stent RCA. nondominant normal EF normal. Cypher DES to OM 2007 (PLACED PROXIAMAL TO PREVIOUS STENT)  . DM type 2 (diabetes mellitus, type 2)   . HLD (hyperlipidemia)   . PVD (peripheral vascular disease)   . HTN (hypertension)   . MGUS (monoclonal gammopathy of unknown significance) 08/01/2011    IgG kappa dx 2002 8% plasma cells in bone marrow; no lesions on bone X-rays;  . S/P coronary artery stent placement 08/01/2011  . 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 aspirin 81 MG tablet Take 81 mg by mouth daily.      . citalopram (CELEXA) 20 MG tablet Take 20 mg by mouth daily.       . clopidogrel (PLAVIX) 75 MG tablet Take 75 mg by mouth  daily.        [name] lisinopril (PRINIVIL,ZESTRIL) 10 MG tablet Take 10 mg by mouth 2 (two) times daily.       . metFORMIN (GLUCOPHAGE) 500 MG tablet Take 500 mg by mouth daily with breakfast.        . metoprolol (LOPRESSOR) 50 MG tablet Take 50 mg by mouth 2 (two) times daily.       . naproxen (NAPROSYN) 375 MG tablet Take 1 tablet (375 mg total) by mouth 2 (two) times daily.  30 tablet  0  . simvastatin (ZOCOR) 80 MG tablet Take 40 mg by mouth at bedtime.       [name] allopurinol (ZYLOPRIM) 100 MG tablet Take 100 mg by mouth daily.        . nitroGLYCERIN (NITROSTAT) 0.4 MG SL tablet Place 0.4 mg under the tongue every 5 (five) minutes as needed for chest pain. Up to 3 doses       No current facility-administered medications for this encounter.     PHYSICAL EXAM: Filed Vitals:   07/19/12 1215  BP: 138/78  Pulse: 67   General:  Elderly: well appearing. no resp difficulty HEENT: normal Neck: supple. no JVD. Carotids 2+ bilat; no bruits. No lymphadenopathy or thryomegaly appreciated. Cor: PMI nondisplaced. Regular rate & rhythm. No rubs, AS 1/6 murmur.  Lungs: clear Abdomen: soft, nontender, nondistended. No hepatosplenomegaly. No bruits or masses. Good bowel sounds. Extremities: no cyanosis, clubbing, rash, edema Neuro: alert & orientedx3, cranial nerves grossly intact. moves all 4 extremities w/o difficulty. affect pleasant  ASSESSMENT & PLAN:

## 2012-07-19 NOTE — Patient Instructions (Addendum)
Follow up in 6 months  Do the following things EVERYDAY: 1) Weigh yourself in the morning before breakfast. Write it down and keep it in a log. 2) Take your medicines as prescribed 3) Eat low salt foods-Limit salt (sodium) to 2000 mg per day.  4) Stay as active as you can everyday 5) Limit all fluids for the day to less than 2 liters 6)

## 2012-07-19 NOTE — Assessment & Plan Note (Addendum)
Stable. Continue current regimen.   Attending. BP checked personally in clinic. Well controlled.

## 2012-07-20 ENCOUNTER — Telehealth: Payer: Self-pay | Admitting: Oncology

## 2012-07-20 NOTE — Assessment & Plan Note (Addendum)
Attending: Managed by PCP. On simva 40. Goal LDL < 70.

## 2012-07-20 NOTE — Telephone Encounter (Signed)
Pt called, returned call, pt cancelled appt for 4/11 , he informed me that lab was drawn @ [facility] and will fax result, informed nurse

## 2012-07-21 ENCOUNTER — Other Ambulatory Visit: Payer: Medicare Other | Admitting: Lab

## 2012-07-21 ENCOUNTER — Other Ambulatory Visit: Payer: Medicare Other

## 2012-07-21 ENCOUNTER — Other Ambulatory Visit: Payer: Self-pay | Admitting: *Deleted

## 2012-07-21 MED ORDER — AMLODIPINE BESYLATE 2.5 MG PO TABS: ORAL_TABLET | ORAL | Status: DC

## 2012-07-23 ENCOUNTER — Other Ambulatory Visit (HOSPITAL_BASED_OUTPATIENT_CLINIC_OR_DEPARTMENT_OTHER): Payer: Medicare Other | Admitting: Lab

## 2012-07-23 ENCOUNTER — Other Ambulatory Visit: Payer: Medicare Other | Admitting: Lab

## 2012-07-23 DIAGNOSIS — D472 Monoclonal gammopathy: Secondary | ICD-10-CM

## 2012-07-23 DIAGNOSIS — D649 Anemia, unspecified: Secondary | ICD-10-CM

## 2012-07-23 LAB — CBC WITH DIFFERENTIAL/PLATELET
BASO%: 1 % (ref 0.0–2.0)
Basophils Absolute: 0.1 10*3/uL (ref 0.0–0.1)
EOS%: 6.7 % (ref 0.0–7.0)
Eosinophils Absolute: 0.4 10*3/uL (ref 0.0–0.5)
HCT: 37.4 % — ABNORMAL LOW (ref 38.4–49.9)
HGB: 12.3 g/dL — ABNORMAL LOW (ref 13.0–17.1)
LYMPH%: 17 % (ref 14.0–49.0)
MCH: 30.7 pg (ref 27.2–33.4)
MCHC: 32.7 g/dL (ref 32.0–36.0)
MCV: 93.8 fL (ref 79.3–98.0)
MONO#: 0.6 10*3/uL (ref 0.1–0.9)
MONO%: 9.9 % (ref 0.0–14.0)
NEUT#: 4.1 10*3/uL (ref 1.5–6.5)
NEUT%: 65.4 % (ref 39.0–75.0)
Platelets: 301 10*3/uL (ref 140–400)
RBC: 3.99 10*6/uL — ABNORMAL LOW (ref 4.20–5.82)
RDW: 14.1 % (ref 11.0–14.6)
WBC: 6.2 10*3/uL (ref 4.0–10.3)
lymph#: 1.1 10*3/uL (ref 0.9–3.3)

## 2012-07-23 LAB — COMPREHENSIVE METABOLIC PANEL (CC13)
ALT: 26 U/L (ref 0–55)
AST: 27 U/L (ref 5–34)
Albumin: 3.5 g/dL (ref 3.5–5.0)
Alkaline Phosphatase: 77 U/L (ref 40–150)
BUN: 28.5 mg/dL — ABNORMAL HIGH (ref 7.0–26.0)
CO2: 22 mEq/L (ref 22–29)
Calcium: 9 mg/dL (ref 8.4–10.4)
Chloride: 108 mEq/L — ABNORMAL HIGH (ref 98–107)
Creatinine: 1.7 mg/dL — ABNORMAL HIGH (ref 0.7–1.3)
Glucose: 116 mg/dl — ABNORMAL HIGH (ref 70–99)
Potassium: 4 mEq/L (ref 3.5–5.1)
Sodium: 139 mEq/L (ref 136–145)
Total Bilirubin: 0.3 mg/dL (ref 0.20–1.20)
Total Protein: 7.5 g/dL (ref 6.4–8.3)

## 2012-07-24 LAB — IGG: IgG (Immunoglobin G), Serum: 1480 mg/dL (ref 650–1600)

## 2012-07-26 ENCOUNTER — Ambulatory Visit: Payer: Medicare Other | Admitting: Lab

## 2012-07-28 LAB — UIFE/LIGHT CHAINS/TP QN, 24-HR UR
Albumin, U: DETECTED
Alpha 1, Urine: DETECTED — AB
Alpha 2, Urine: DETECTED — AB
Beta, Urine: DETECTED — AB
Free Kappa Lt Chains,Ur: 3.25 mg/dL — ABNORMAL HIGH (ref 0.14–2.42)
Free Kappa/Lambda Ratio: 10.48 ratio — ABNORMAL HIGH (ref 2.04–10.37)
Free Lambda Excretion/Day: 6.2 mg/d
Free Lambda Lt Chains,Ur: 0.31 mg/dL (ref 0.02–0.67)
Free Lt Chn Excr Rate: 65 mg/d
Gamma Globulin, Urine: DETECTED — AB
Time: 24 hours
Total Protein, Urine-Ur/day: 340 mg/d — ABNORMAL HIGH (ref 10–140)
Total Protein, Urine: 17 mg/dL
Volume, Urine: 2000 mL

## 2012-07-28 LAB — CREATININE CLEARANCE, URINE, 24 HOUR
Collection Interval-CRCL: 24 hours
Creatinine Clearance: 107 mL/min (ref 75–125)
Creatinine, 24H Ur: 2552 mg/d — ABNORMAL HIGH (ref 800–2000)
Creatinine, Urine: 127.6 mg/dL
Creatinine: 1.65 mg/dL — ABNORMAL HIGH (ref 0.50–1.35)
Urine Total Volume-CRCL: 2000 mL

## 2012-08-02 ENCOUNTER — Telehealth: Payer: Self-pay | Admitting: Oncology

## 2012-08-03 ENCOUNTER — Telehealth: Payer: Self-pay | Admitting: Oncology

## 2012-08-03 ENCOUNTER — Telehealth: Payer: Self-pay | Admitting: *Deleted

## 2012-08-03 ENCOUNTER — Ambulatory Visit: Payer: Medicare Other | Admitting: Oncology

## 2012-08-03 NOTE — Telephone Encounter (Signed)
Pt called stating he is cancelling today's appt 08/03/12 and needs to re-schedule.  POF complete and call transferred to scheduler.

## 2012-08-10 ENCOUNTER — Encounter: Payer: Self-pay | Admitting: Nephrology

## 2012-08-25 ENCOUNTER — Other Ambulatory Visit: Payer: Medicare Other

## 2012-08-25 DIAGNOSIS — N183 Chronic kidney disease, stage 3 unspecified: Secondary | ICD-10-CM

## 2012-08-25 DIAGNOSIS — E1129 Type 2 diabetes mellitus with other diabetic kidney complication: Secondary | ICD-10-CM

## 2012-08-25 DIAGNOSIS — E785 Hyperlipidemia, unspecified: Secondary | ICD-10-CM

## 2012-08-25 DIAGNOSIS — M109 Gout, unspecified: Secondary | ICD-10-CM

## 2012-08-26 LAB — LIPID PANEL
Chol/HDL Ratio: 3.8 ratio units (ref 0.0–5.0)
Cholesterol, Total: 188 mg/dL (ref 100–199)
HDL: 50 mg/dL (ref >39)
LDL Calculated: 114 mg/dL — ABNORMAL HIGH (ref 0–99)
Triglycerides: 118 mg/dL (ref 0–149)
VLDL Cholesterol Cal: 24 mg/dL (ref 5–40)

## 2012-08-26 LAB — BASIC METABOLIC PANEL
BUN/Creatinine Ratio: 12 (ref 10–22)
BUN: 18 mg/dL (ref 8–27)
CO2: 24 mmol/L (ref 19–28)
Calcium: 9.4 mg/dL (ref 8.6–10.2)
Chloride: 103 mmol/L (ref 97–108)
Creatinine, Ser: 1.56 mg/dL — ABNORMAL HIGH (ref 0.76–1.27)
GFR calc Af Amer: 48 mL/min/{1.73_m2} — ABNORMAL LOW (ref >59)
GFR calc non Af Amer: 42 mL/min/{1.73_m2} — ABNORMAL LOW (ref >59)
Glucose: 96 mg/dL (ref 65–99)
Potassium: 4.6 mmol/L (ref 3.5–5.2)
Sodium: 140 mmol/L (ref 134–144)

## 2012-08-26 LAB — URIC ACID: Uric Acid: 5.9 mg/dL (ref 3.7–8.6)

## 2012-08-26 LAB — HEMOGLOBIN A1C
Est. average glucose Bld gHb Est-mCnc: 123 mg/dL
Hgb A1c MFr Bld: 5.9 % — ABNORMAL HIGH (ref 4.8–5.6)

## 2012-08-26 LAB — TSH: TSH: 3.24 u[IU]/mL (ref 0.450–4.500)

## 2012-08-27 ENCOUNTER — Encounter: Payer: Self-pay | Admitting: Geriatric Medicine

## 2012-08-27 ENCOUNTER — Other Ambulatory Visit: Payer: Self-pay

## 2012-08-30 ENCOUNTER — Ambulatory Visit (INDEPENDENT_AMBULATORY_CARE_PROVIDER_SITE_OTHER): Payer: Medicare Other | Admitting: Internal Medicine

## 2012-08-30 ENCOUNTER — Encounter: Payer: Self-pay | Admitting: Internal Medicine

## 2012-08-30 VITALS — BP 130/66 | HR 71 | Temp 98.5°F | Resp 18 | Ht 68.0 in | Wt 195.0 lb

## 2012-08-30 DIAGNOSIS — I10 Essential (primary) hypertension: Secondary | ICD-10-CM

## 2012-08-30 DIAGNOSIS — D472 Monoclonal gammopathy: Secondary | ICD-10-CM

## 2012-08-30 DIAGNOSIS — E785 Hyperlipidemia, unspecified: Secondary | ICD-10-CM

## 2012-08-30 DIAGNOSIS — E1129 Type 2 diabetes mellitus with other diabetic kidney complication: Secondary | ICD-10-CM

## 2012-08-30 DIAGNOSIS — E1122 Type 2 diabetes mellitus with diabetic chronic kidney disease: Secondary | ICD-10-CM

## 2012-08-30 DIAGNOSIS — N182 Chronic kidney disease, stage 2 (mild): Secondary | ICD-10-CM

## 2012-08-30 DIAGNOSIS — I2510 Atherosclerotic heart disease of native coronary artery without angina pectoris: Secondary | ICD-10-CM

## 2012-08-30 MED ORDER — ROSUVASTATIN CALCIUM 5 MG PO TABS: 5.0000 mg | ORAL_TABLET | Freq: Every day | ORAL | Status: DC

## 2012-08-30 NOTE — Assessment & Plan Note (Signed)
At goal.  Cont amlodipine, lisinopril, losartan, lopressor.  I am not sure how he's wound up on both an ace and an arb.  I will plan to increase the dose of one and stop the other next time.

## 2012-08-30 NOTE — Assessment & Plan Note (Signed)
Continues to follow up annually with Dr. Beryle Beams to monitor for progression to myeloma.  Has been stable.

## 2012-08-30 NOTE — Assessment & Plan Note (Signed)
LDL is above goal at 114 despite zocor 80mg .  Will d/c zocor and begin crestor 5mg  each evening.  Recheck lipids in 3 mos prior to his next visit.  He is also going to watch his diet more.

## 2012-08-30 NOTE — Assessment & Plan Note (Signed)
Cont beta blocker, ace, asa 81, changed statin today to crestor due to elevated LDL

## 2012-08-30 NOTE — Progress Notes (Signed)
Patient ID: JOSEMARIA TOLMAN, male   DOB: Mar 13, 1931, 79 y.o.   MRN: [id number] Code Status: Does not have living will--I need to discuss this with him next visit  No Known Allergies  Chief Complaint  Patient presents with  . Medical Managment of Chronic Issues    no new problems    HPI: Patient is a 79 y.o. AA male seen in the office today for routine mgt of chronic conditions.  Has no complaints.  Had his bloodwork done.  Works a lot in the garden for exercise.  Eats a healthy balanced diet.  Doing fine on his own.  Says life goes on after his wife's death.    Review of Systems:  Review of Systems  Constitutional: Negative for weight loss.  Eyes: Negative for blurred vision.  Respiratory: Negative for shortness of breath.   Cardiovascular: Negative for chest pain.  Gastrointestinal: Negative for constipation, blood in stool and melena.  Genitourinary: Negative for dysuria, urgency and frequency.  Musculoskeletal: Positive for joint pain. Negative for falls.       Left knee gives him trouble sometimes, wears braces on knees when works in garden, has left on here today  Neurological: Negative for loss of consciousness and headaches.  Endo/Heme/Allergies: Does not bruise/bleed easily.  Psychiatric/Behavioral: Negative for depression and memory loss. The patient is not nervous/anxious.      Past Medical History  Diagnosis Date  . Renal insufficiency   . CAD (coronary artery disease)     PTCA of OM in 1998, BMS OM in 2000, Cath 9/07 LM ok LAD ok. LCX 95% in OM prior to previous stent RCA. nondominant normal EF normal. Cypher DES to OM 2007 (PLACED PROXIAMAL TO PREVIOUS STENT)  . DM type 2 (diabetes mellitus, type 2)   . HLD (hyperlipidemia)   . PVD (peripheral vascular disease)   . HTN (hypertension)   . MGUS (monoclonal gammopathy of unknown significance) 08/01/2011    IgG kappa dx 2002 8% plasma cells in bone marrow; no lesions on bone X-rays;  . S/P coronary artery stent  placement 08/01/2011  . 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 Peripheral vascular disease, unspecified   . Personality change due to conditions classified elsewhere   . Loss of weight   . Depression   . Urinary tract infection, site not specified   . Memory loss   . Chronic kidney disease, stage III (moderate)   . Chronic kidney disease, unspecified   . Spasm of muscle   . Unspecified disorder of kidney and ureter   . Thyroid disease   . Unspecified hearing loss   . Other atopic dermatitis and related conditions   . Monoclonal paraproteinemia   . Osteoarthrosis, unspecified whether generalized or localized, unspecified site   . Gout, unspecified   . Coronary atherosclerosis of unspecified type of vessel, native or graft    Past Surgical History  Procedure Laterality Date  . Carotid stent    . Coronary angioplasty    . Knee arthroplasty      total  . Joint replacement      left knee   Social History:   reports that he quit smoking about 9 years ago. He has never used smokeless tobacco. He reports that he does not drink alcohol or use illicit drugs.  Family History  Problem Relation Age of Onset  . Diabetes    . Coronary artery disease    . Cancer    . Heart disease  Mother   . Heart disease Sister   . Heart disease Sister   . Cancer Sister   . Hypertension Sister   . Diabetes Brother   . Diabetes Brother     Medications: Patient's Medications  New Prescriptions   ROSUVASTATIN (CRESTOR) 5 MG TABLET    Take 1 tablet (5 mg total) by mouth daily.  Previous Medications   ALLOPURINOL (ZYLOPRIM) 100 MG TABLET    Take 100 mg by mouth daily.     AMLODIPINE (NORVASC) 2.5 MG TABLET    Take one tablet once a day for blood pressure   ASPIRIN 81 MG TABLET    Take 81 mg by mouth daily.   CITALOPRAM (CELEXA) 20 MG TABLET    Take 20 mg by mouth daily.    CLOPIDOGREL (PLAVIX) 75 MG TABLET    Take 75 mg by mouth daily.     LISINOPRIL (PRINIVIL,ZESTRIL) 10 MG TABLET    Take 10  mg by mouth 2 (two) times daily.    LOSARTAN (COZAAR) 25 MG TABLET       METFORMIN (GLUCOPHAGE) 500 MG TABLET    Take 500 mg by mouth daily with breakfast.     METOPROLOL (LOPRESSOR) 50 MG TABLET    Take 50 mg by mouth 2 (two) times daily.    NAPROXEN (NAPROSYN) 375 MG TABLET    Take 1 tablet (375 mg total) by mouth 2 (two) times daily.   NITROGLYCERIN (NITROSTAT) 0.4 MG SL TABLET    Place 0.4 mg under the tongue every 5 (five) minutes as needed for chest pain. Up to 3 doses  Modified Medications   No medications on file  Discontinued Medications   SIMVASTATIN (ZOCOR) 80 MG TABLET    Take 40 mg by mouth at bedtime.    Physical Exam:  Filed Vitals:   08/30/12 0934  BP: 130/66  Pulse: 71  Temp: 98.5 F (36.9 C)  TempSrc: Oral  Resp: 18  Height: 5\' 8"  (1.727 m)  Weight: 195 lb (88.451 kg)  SpO2: 99%  Physical Exam  Constitutional: He is oriented to person, place, and time. He appears well-developed and well-nourished. No distress.  AA male  HENT:  Head: Normocephalic and atraumatic.  Eyes: Pupils are equal, round, and reactive to light.  Neck: No JVD present.  Cardiovascular: Normal rate, regular rhythm, normal heart sounds and intact distal pulses.   Pulmonary/Chest: Effort normal and breath sounds normal. No respiratory distress.  Abdominal: Soft. Bowel sounds are normal. He exhibits no distension and no mass. There is no tenderness.  Musculoskeletal: Normal range of motion. He exhibits no edema.  Limps with left hip pain, wearing brace on left knee  Neurological: He is alert and oriented to person, place, and time.  Skin:  Hair loss centrally with vitiligo   Labs reviewed: 09/17/2011 CBC Wbc 6.7 Rbc 3.74 Hemoglobin 11.7  CMP Glucose 92 Bun 23 Creatinine 1.49  HA1C 5.8   12/29/2011 BMP; Glucose 100, BUN 29, Creatinine 1.45 A1c 5.7 Microalbumin 230.5 04/26/2012 CBC; WBC 6.7, RBC 4.15, HGB 12.7 CMP; Glucose 107, BUN 25, Creatinine 1.44 A1c 5.9 Basic Metabolic  Panel:  Recent Labs  07/23/12 1031 07/26/12 1116 08/25/12 1140  NA 139  --  140  K 4.0  --  4.6  CL 108*  --  103  CO2 22  --  24  GLUCOSE 116*  --  96  BUN 28.5*  --  18  CREATININE 1.7* 1.65* 1.56*  CALCIUM 9.0  --  9.4  TSH  --   --  3.240   Liver Function Tests:  Recent Labs  07/23/12 1031  AST 27  ALT 26  ALKPHOS 77  BILITOT 0.30  PROT 7.5  ALBUMIN 3.5  CBC:  Recent Labs  07/23/12 1031  WBC 6.2  NEUTROABS 4.1  HGB 12.3*  HCT 37.4*  MCV 93.8  PLT 301   Lipid Panel:  Recent Labs  08/25/12 1140  HDL 50  LDLCALC 114*  TRIG 118  CHOLHDL 3.8    Past Procedures: MINI-MENTAL STATUS EXAM:  03/19/2010 total 27, failed clock drawing Avel Sensor, [facility] 2008 Colonoscopy  09/30/2010 CT of the Abdomen Calcified coronary artery disease No acute or neoplastic process  09/30/2010 CT of the Pelvis Moderate artheromatous vascular calcification, left renal stent with very small sliding type hiatus hernia Otherwise negative 09/30/2010 CT of the Head Slight to moderate age related cerebral atrophy and chronic appearing microvascular subcortical ischemia Otherwise negative   Assessment/Plan HYPERLIPIDEMIA-MIXED LDL is above goal at 114 despite zocor 80mg .  Will d/c zocor and begin crestor 5mg  each evening.  Recheck lipids in 3 mos prior to his next visit.  He is also going to watch his diet more.    HYPERTENSION, UNSPECIFIED At goal.  Cont amlodipine, lisinopril, losartan, lopressor.  I am not sure how he's wound up on both an ace and an arb.  I will plan to increase the dose of one and stop the other next time.    CAD, NATIVE VESSEL Cont beta blocker, ace, asa 81, changed statin today to crestor due to elevated LDL  MGUS (monoclonal gammopathy of unknown significance) Continues to follow up annually with Dr. Beryle Beams to monitor for progression to myeloma.  Has been stable.  DM type 2 causing CKD stage 2 Is also on ace and arb now.  Will plan to choose one and  maximize dose.     Labs/tests ordered:  Cbc, flp, cmp, hba1c before 3 mo f/u

## 2012-08-30 NOTE — Assessment & Plan Note (Signed)
Is also on ace and arb now.  Will plan to choose one and maximize dose.

## 2012-10-07 ENCOUNTER — Telehealth: Payer: Self-pay | Admitting: Oncology

## 2012-10-07 NOTE — Telephone Encounter (Signed)
Called pt ,r/s from 7/1 to 7/2 due to MD call day per MD

## 2012-10-12 ENCOUNTER — Other Ambulatory Visit: Payer: Medicare Other | Admitting: Lab

## 2012-10-12 ENCOUNTER — Ambulatory Visit: Payer: Medicare Other | Admitting: Oncology

## 2012-10-13 ENCOUNTER — Ambulatory Visit (HOSPITAL_BASED_OUTPATIENT_CLINIC_OR_DEPARTMENT_OTHER): Payer: Medicare Other | Admitting: Nurse Practitioner

## 2012-10-13 ENCOUNTER — Other Ambulatory Visit (HOSPITAL_BASED_OUTPATIENT_CLINIC_OR_DEPARTMENT_OTHER): Payer: Medicare Other | Admitting: Lab

## 2012-10-13 ENCOUNTER — Telehealth: Payer: Self-pay | Admitting: Oncology

## 2012-10-13 VITALS — BP 144/76 | HR 92 | Temp 97.5°F | Resp 20 | Ht 68.0 in | Wt 190.9 lb

## 2012-10-13 DIAGNOSIS — D472 Monoclonal gammopathy: Secondary | ICD-10-CM

## 2012-10-13 NOTE — Progress Notes (Signed)
OFFICE PROGRESS NOTE  Interval history:  [name] is a 79 year old man with IgG monoclonal gammopathy of undetermined significance dating back approximately 12 years. At initial presentation February 2002 the IgG level was 3880. There have been some minor fluctuations with the level remaining stable overall.  He had labs done in April of this year with a followup visit also planned for April. He had to cancel that visit due to a death in the family.  The labs done on 07/23/2012 showed a hemoglobin of 12.3, white count 6.2, absolute neutrophil count 4.1, platelet count 301,000; BUN 28.5, creatinine 1.7 (stable); IgG 1480; 24-hour urine total protein 340 mg as compared to 425 mg one year ago. No monoclonal free light chains seen on immunofixation electrophoresis.  He feels well. No interim illnesses or infections. No hospitalizations. He has chronic mild left knee pain following a knee replacement. He has a good appetite and good energy level. He remains very active. No shortness of breath. No cough. Bowels moving regularly. No hematuria or dysuria.   Objective: Blood pressure 144/76, pulse 92, temperature 97.5 F (36.4 C), temperature source Oral, resp. rate 20, height 5\' 8"  (1.727 m), weight 190 lb 14.4 oz (86.592 kg).  Oropharynx is without thrush or ulceration. No palpable cervical, supraclavicular or axillary lymph nodes. Lungs are clear. No wheezes rales. Regular cardiac rhythm. Abdomen is soft and nontender. No organomegaly. Trace to 1+ edema at the ankles bilaterally. Calves nontender. Motor strength 5 over 5.  Lab Results: Lab Results  Component Value Date   WBC 6.2 07/23/2012   HGB 12.3* 07/23/2012   HCT 37.4* 07/23/2012   MCV 93.8 07/23/2012   PLT 301 07/23/2012    Chemistry:    Chemistry      Component Value Date/Time   NA 140 08/25/2012 1140   NA 139 07/23/2012 1031   NA 140 07/25/2011 1428   K 4.6 08/25/2012 1140   K 4.0 07/23/2012 1031   CL 103 08/25/2012 1140   CL 108*  07/23/2012 1031   CO2 24 08/25/2012 1140   CO2 22 07/23/2012 1031   BUN 18 08/25/2012 1140   BUN 28.5* 07/23/2012 1031   BUN 30* 07/25/2011 1428   CREATININE 1.56* 08/25/2012 1140   CREATININE 1.65* 07/26/2012 1116   CREATININE 1.7* 07/23/2012 1031      Component Value Date/Time   CALCIUM 9.4 08/25/2012 1140   CALCIUM 9.0 07/23/2012 1031   ALKPHOS 77 07/23/2012 1031   ALKPHOS 73 07/25/2011 1428   AST 27 07/23/2012 1031   AST 24 07/25/2011 1428   ALT 26 07/23/2012 1031   ALT 17 07/25/2011 1428   BILITOT 0.30 07/23/2012 1031   BILITOT 0.3 07/25/2011 1428       Studies/Results: No results found.  Medications: I have reviewed the patient's current medications.  Assessment/Plan:  1. IgG kappa monoclonal gammopathy of undetermined significance. 2. Chronic renal insufficiency. 3. Essential hypertension. 4. Coronary artery disease status post MI, status post coronary stenting. 5. Type 2 diabetes. 6. Gout. 7. Hyperlipidemia. 8. Chronic normochromic anemia related to chronic renal insufficiency.  Disposition-lab values remain stable. He will return for labs and a followup visit in one year.  Plan reviewed with Dr. Beryle Beams.  Kevin Schwartz ANP/GNP-BC    CC Dr. Hollace Kinnier.

## 2012-10-13 NOTE — Telephone Encounter (Signed)
Gave pt appt for lab and MD for June and July 2015 °

## 2012-11-25 ENCOUNTER — Other Ambulatory Visit: Payer: Medicare Other

## 2012-11-25 DIAGNOSIS — E1122 Type 2 diabetes mellitus with diabetic chronic kidney disease: Secondary | ICD-10-CM

## 2012-11-25 DIAGNOSIS — I10 Essential (primary) hypertension: Secondary | ICD-10-CM

## 2012-11-25 DIAGNOSIS — D472 Monoclonal gammopathy: Secondary | ICD-10-CM

## 2012-11-25 DIAGNOSIS — E785 Hyperlipidemia, unspecified: Secondary | ICD-10-CM

## 2012-11-26 LAB — CBC WITH DIFFERENTIAL/PLATELET
Basophils Absolute: 0 10*3/uL (ref 0.0–0.2)
Basos: 0 % (ref 0–3)
Eos: 3 % (ref 0–5)
Eosinophils Absolute: 0.2 10*3/uL (ref 0.0–0.4)
HCT: 39.6 % (ref 37.5–51.0)
Hemoglobin: 13.1 g/dL (ref 12.6–17.7)
Immature Grans (Abs): 0 10*3/uL (ref 0.0–0.1)
Immature Granulocytes: 0 % (ref 0–2)
Lymphocytes Absolute: 1.1 10*3/uL (ref 0.7–3.1)
Lymphs: 18 % (ref 14–46)
MCH: 30.9 pg (ref 26.6–33.0)
MCHC: 33.1 g/dL (ref 31.5–35.7)
MCV: 93 fL (ref 79–97)
Monocytes Absolute: 0.5 10*3/uL (ref 0.1–0.9)
Monocytes: 9 % (ref 4–12)
Neutrophils Absolute: 4.2 10*3/uL (ref 1.4–7.0)
Neutrophils Relative %: 70 % (ref 40–74)
RBC: 4.24 x10E6/uL (ref 4.14–5.80)
RDW: 14 % (ref 12.3–15.4)
WBC: 6 10*3/uL (ref 3.4–10.8)

## 2012-11-26 LAB — COMPREHENSIVE METABOLIC PANEL
ALT: 17 IU/L (ref 0–44)
AST: 18 IU/L (ref 0–40)
Albumin/Globulin Ratio: 1.4 (ref 1.1–2.5)
Albumin: 4.3 g/dL (ref 3.5–4.8)
Alkaline Phosphatase: 78 IU/L (ref 39–117)
BUN/Creatinine Ratio: 11 (ref 10–22)
BUN: 16 mg/dL (ref 8–27)
CO2: 23 mmol/L (ref 18–29)
Calcium: 9.4 mg/dL (ref 8.6–10.2)
Chloride: 102 mmol/L (ref 97–108)
Creatinine, Ser: 1.49 mg/dL — ABNORMAL HIGH (ref 0.76–1.27)
GFR calc Af Amer: 51 mL/min/{1.73_m2} — ABNORMAL LOW (ref >59)
GFR calc non Af Amer: 44 mL/min/{1.73_m2} — ABNORMAL LOW (ref >59)
Globulin, Total: 3 g/dL (ref 1.5–4.5)
Glucose: 96 mg/dL (ref 65–99)
Potassium: 4.2 mmol/L (ref 3.5–5.2)
Sodium: 139 mmol/L (ref 134–144)
Total Bilirubin: 0.3 mg/dL (ref 0.0–1.2)
Total Protein: 7.3 g/dL (ref 6.0–8.5)

## 2012-11-26 LAB — LIPID PANEL
Chol/HDL Ratio: 5.1 ratio units — ABNORMAL HIGH (ref 0.0–5.0)
Cholesterol, Total: 243 mg/dL — ABNORMAL HIGH (ref 100–199)
HDL: 48 mg/dL (ref >39)
LDL Calculated: 170 mg/dL — ABNORMAL HIGH (ref 0–99)
Triglycerides: 125 mg/dL (ref 0–149)
VLDL Cholesterol Cal: 25 mg/dL (ref 5–40)

## 2012-11-26 LAB — HEMOGLOBIN A1C
Est. average glucose Bld gHb Est-mCnc: 128 mg/dL
Hgb A1c MFr Bld: 6.1 % — ABNORMAL HIGH (ref 4.8–5.6)

## 2012-11-29 ENCOUNTER — Encounter: Payer: Self-pay | Admitting: Internal Medicine

## 2012-11-29 ENCOUNTER — Ambulatory Visit (INDEPENDENT_AMBULATORY_CARE_PROVIDER_SITE_OTHER): Payer: Medicare Other | Admitting: Internal Medicine

## 2012-11-29 VITALS — BP 138/76 | HR 103 | Temp 98.9°F | Resp 18 | Ht 68.0 in | Wt 189.0 lb

## 2012-11-29 DIAGNOSIS — E785 Hyperlipidemia, unspecified: Secondary | ICD-10-CM

## 2012-11-29 DIAGNOSIS — E1122 Type 2 diabetes mellitus with diabetic chronic kidney disease: Secondary | ICD-10-CM

## 2012-11-29 DIAGNOSIS — D472 Monoclonal gammopathy: Secondary | ICD-10-CM

## 2012-11-29 DIAGNOSIS — I2510 Atherosclerotic heart disease of native coronary artery without angina pectoris: Secondary | ICD-10-CM

## 2012-11-29 DIAGNOSIS — D649 Anemia, unspecified: Secondary | ICD-10-CM

## 2012-11-29 DIAGNOSIS — I10 Essential (primary) hypertension: Secondary | ICD-10-CM

## 2012-11-29 DIAGNOSIS — N182 Chronic kidney disease, stage 2 (mild): Secondary | ICD-10-CM

## 2012-11-29 DIAGNOSIS — M109 Gout, unspecified: Secondary | ICD-10-CM

## 2012-11-29 DIAGNOSIS — E1129 Type 2 diabetes mellitus with other diabetic kidney complication: Secondary | ICD-10-CM

## 2012-11-29 MED ORDER — AMLODIPINE BESYLATE 2.5 MG PO TABS: ORAL_TABLET | ORAL | Status: DC

## 2012-11-29 MED ORDER — ROSUVASTATIN CALCIUM 5 MG PO TABS: 5.0000 mg | ORAL_TABLET | Freq: Every day | ORAL | Status: DC

## 2012-11-29 MED ORDER — METOPROLOL TARTRATE 50 MG PO TABS: 50.0000 mg | ORAL_TABLET | Freq: Two times a day (BID) | ORAL | Status: DC

## 2012-11-29 MED ORDER — LOSARTAN POTASSIUM 25 MG PO TABS: ORAL_TABLET | ORAL | Status: DC

## 2012-11-29 MED ORDER — CITALOPRAM HYDROBROMIDE 20 MG PO TABS: 20.0000 mg | ORAL_TABLET | Freq: Every day | ORAL | Status: DC

## 2012-11-29 MED ORDER — METFORMIN HCL 500 MG PO TABS: 500.0000 mg | ORAL_TABLET | Freq: Every day | ORAL | Status: DC

## 2012-11-29 MED ORDER — ALLOPURINOL 100 MG PO TABS: 100.0000 mg | ORAL_TABLET | Freq: Every day | ORAL | Status: DC

## 2012-11-29 MED ORDER — CLOPIDOGREL BISULFATE 75 MG PO TABS: 75.0000 mg | ORAL_TABLET | Freq: Every day | ORAL | Status: DC

## 2012-11-29 MED ORDER — LISINOPRIL 10 MG PO TABS: 10.0000 mg | ORAL_TABLET | Freq: Two times a day (BID) | ORAL | Status: DC

## 2012-11-29 NOTE — Progress Notes (Signed)
Patient ID: Kevin Schwartz, male   DOB: 1930/09/20, 79 y.o.   MRN: [id number] Location:  [facility]  Code Status: Discuss living will next time   No Known Allergies  Chief Complaint  Patient presents with  . Follow-up    HPI: Patient is a 79 y.o. black male seen in the office today for regular visit.  Stopped his crestor, amlodipine, plavix, and allopurinol.    Had to go to [place] about his left leg.  They wanted to know why he took all of his medicine--they gave him something there--junk he says.  Xrayed his leg--offered him surgery to redo the left knee replacement, but he did not want surgery.  Wears brace on the knee.  It's been doing alright if he uses the brace.   Discussed getting rid of a duplicate med with lisinopril and losartan.     Review of Systems:  Review of Systems  Respiratory: Negative for shortness of breath.   Cardiovascular: Negative for chest pain.  Gastrointestinal: Negative for constipation.  Musculoskeletal: Negative for falls.  Psychiatric/Behavioral: Negative for depression.       Mood good   Past Medical History  Diagnosis Date  . Renal insufficiency   . CAD (coronary artery disease)     PTCA of OM in 1998, BMS OM in 2000, Cath 9/07 LM ok LAD ok. LCX 95% in OM prior to previous stent RCA. nondominant normal EF normal. Cypher DES to OM 2007 (PLACED PROXIAMAL TO PREVIOUS STENT)  . DM type 2 (diabetes mellitus, type 2)   . HLD (hyperlipidemia)   . PVD (peripheral vascular disease)   . HTN (hypertension)   . MGUS (monoclonal gammopathy of unknown significance) 08/01/2011    IgG kappa dx 2002 8% plasma cells in bone marrow; no lesions on bone X-rays;  . S/P coronary artery stent placement 08/01/2011  . 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 Peripheral vascular disease, unspecified   . Personality change due to conditions classified elsewhere   . Loss of weight   . Depression   . Urinary tract infection, site  not specified   . Memory loss   . Chronic kidney disease, stage III (moderate)   . Chronic kidney disease, unspecified   . Spasm of muscle   . Unspecified disorder of kidney and ureter   . Thyroid disease   . Unspecified hearing loss   . Other atopic dermatitis and related conditions   . Monoclonal paraproteinemia   . Osteoarthrosis, unspecified whether generalized or localized, unspecified site   . Gout, unspecified   . Coronary atherosclerosis of unspecified type of vessel, native or graft     Past Surgical History  Procedure Laterality Date  . Carotid stent    . Coronary angioplasty    . Knee arthroplasty      total  . Joint replacement      left knee    Social History:   reports that he quit smoking about 10 years ago. He has never used smokeless tobacco. He reports that he does not drink alcohol or use illicit drugs.  Family History  Problem Relation Age of Onset  . Diabetes    . Coronary artery disease    . Cancer    . Heart disease Mother   . Heart disease Sister   . Heart disease Sister   . Cancer Sister   . Hypertension Sister   . Diabetes Brother   . Diabetes  Brother     Medications: Patient's Medications  New Prescriptions   No medications on file  Previous Medications   ASPIRIN 81 MG TABLET    Take 81 mg by mouth daily.   NAPROXEN (NAPROSYN) 375 MG TABLET    Take 1 tablet (375 mg total) by mouth 2 (two) times daily.   NITROGLYCERIN (NITROSTAT) 0.4 MG SL TABLET    Place 0.4 mg under the tongue every 5 (five) minutes as needed for chest pain. Up to 3 doses  Modified Medications   Modified Medication Previous Medication   ALLOPURINOL (ZYLOPRIM) 100 MG TABLET allopurinol (ZYLOPRIM) 100 MG tablet      Take 1 tablet (100 mg total) by mouth daily.    Take 100 mg by mouth daily.     AMLODIPINE (NORVASC) 2.5 MG TABLET amLODipine (NORVASC) 2.5 MG tablet      Take one tablet once a day for blood pressure    Take one tablet once a day for blood pressure    CITALOPRAM (CELEXA) 20 MG TABLET citalopram (CELEXA) 20 MG tablet      Take 1 tablet (20 mg total) by mouth daily.    Take 20 mg by mouth daily.    CLOPIDOGREL (PLAVIX) 75 MG TABLET clopidogrel (PLAVIX) 75 MG tablet      Take 1 tablet (75 mg total) by mouth daily.    Take 75 mg by mouth daily.     LISINOPRIL (PRINIVIL,ZESTRIL) 10 MG TABLET lisinopril (PRINIVIL,ZESTRIL) 10 MG tablet      Take 1 tablet (10 mg total) by mouth 2 (two) times daily.    Take 10 mg by mouth 2 (two) times daily.    LOSARTAN (COZAAR) 25 MG TABLET losartan (COZAAR) 25 MG tablet      Take one tablet once daily       METFORMIN (GLUCOPHAGE) 500 MG TABLET metFORMIN (GLUCOPHAGE) 500 MG tablet      Take 1 tablet (500 mg total) by mouth daily with breakfast.    Take 500 mg by mouth daily with breakfast.     METOPROLOL (LOPRESSOR) 50 MG TABLET metoprolol (LOPRESSOR) 50 MG tablet      Take 1 tablet (50 mg total) by mouth 2 (two) times daily.    Take 50 mg by mouth 2 (two) times daily.    ROSUVASTATIN (CRESTOR) 5 MG TABLET rosuvastatin (CRESTOR) 5 MG tablet      Take 1 tablet (5 mg total) by mouth daily.    Take 1 tablet (5 mg total) by mouth daily.  Discontinued Medications   No medications on file     Physical Exam: Filed Vitals:   11/29/12 0924  BP: 138/76  Pulse: 103  Temp: 98.9 F (37.2 C)  TempSrc: Oral  Resp: 18  Height: 5\' 8"  (1.727 m)  Weight: 189 lb (85.73 kg)  SpO2: 97%  Physical Exam  Constitutional: He is oriented to person, place, and time. He appears well-developed and well-nourished. No distress.  HENT:  Head: Normocephalic and atraumatic.  Cardiovascular: Normal rate, regular rhythm, normal heart sounds and intact distal pulses.   Pulmonary/Chest: Effort normal and breath sounds normal. No respiratory distress.  Abdominal: Soft. Bowel sounds are normal. He exhibits no distension. There is no tenderness.  Musculoskeletal: He exhibits edema.  Mild peripheral edema b/l LE  Neurological: He is alert  and oriented to person, place, and time.  Skin: Skin is warm and dry.    Labs reviewed: Basic Metabolic Panel:  Recent Labs  07/23/12 1031  07/26/12 1116 08/25/12 1140 11/25/12 0919  NA 139  --  140 139  K 4.0  --  4.6 4.2  CL 108*  --  103 102  CO2 22  --  24 23  GLUCOSE 116*  --  96 96  BUN 28.5*  --  18 16  CREATININE 1.7* 1.65* 1.56* 1.49*  CALCIUM 9.0  --  9.4 9.4  TSH  --   --  3.240  --    Liver Function Tests:  Recent Labs  07/23/12 1031 11/25/12 0919  AST 27 18  ALT 26 17  ALKPHOS 77 78  BILITOT 0.30 0.3  PROT 7.5 7.3  ALBUMIN 3.5  --   CBC:  Recent Labs  07/23/12 1031 11/25/12 0919  WBC 6.2 6.0  NEUTROABS 4.1 4.2  HGB 12.3* 13.1  HCT 37.4* 39.6  MCV 93.8 93  PLT 301  --    Lipid Panel:  Recent Labs  08/25/12 1140 11/25/12 0919  HDL 50 48  LDLCALC 114* 170*  TRIG 118 125  CHOLHDL 3.8 5.1*   Lab Results  Component Value Date   HGBA1C 6.1* 11/25/2012   Assessment/Plan 1. MGUS (monoclonal gammopathy of unknown significance) -doing well--it's been 12 years--labs wnl in 4/14 with Dr. Azucena Freed office  2. Normochromic normocytic anemia -improved recently  3. HYPERLIPIDEMIA-MIXED Restart crestor--lipids dramatically up w/o it - rosuvastatin (CRESTOR) 5 MG tablet; Take 1 tablet (5 mg total) by mouth daily.  Dispense: 30 tablet; Refill: 6  4. CAD, NATIVE VESSEL -cont metoprolol, crestor, losartan - rosuvastatin (CRESTOR) 5 MG tablet; Take 1 tablet (5 mg total) by mouth daily.  Dispense: 30 tablet; Refill: 6  5. DM type 2 causing CKD stage 2 - rosuvastatin (CRESTOR) 5 MG tablet; Take 1 tablet (5 mg total) by mouth daily.  Dispense: 30 tablet; Refill: 6 -cont metformin--sugars have been fine -did got down a notch with his diet, he says--encouraged to get back at it  6. Gout, unspecified -resume allopurinol to prevent gout flares  7. Unspecified essential hypertension Regimen will now be metoprolol, losartan, amlodipine (stop  lisinopril)  Labs/tests ordered:  Cbc, cmp, flp, hba1c Next appt: 3 mos

## 2012-12-25 ENCOUNTER — Encounter (HOSPITAL_COMMUNITY): Payer: Self-pay | Admitting: *Deleted

## 2012-12-25 ENCOUNTER — Emergency Department (HOSPITAL_COMMUNITY): Payer: Medicare Other

## 2012-12-25 ENCOUNTER — Observation Stay (HOSPITAL_COMMUNITY)
Admission: EM | Admit: 2012-12-25 | Discharge: 2012-12-27 | Disposition: A | Discharge status: Home / Self Care | Payer: Medicare Other | Attending: Internal Medicine | Admitting: Internal Medicine

## 2012-12-25 DIAGNOSIS — T783XXD Angioneurotic edema, subsequent encounter: Secondary | ICD-10-CM

## 2012-12-25 DIAGNOSIS — Z955 Presence of coronary angioplasty implant and graft: Secondary | ICD-10-CM

## 2012-12-25 DIAGNOSIS — I129 Hypertensive chronic kidney disease with stage 1 through stage 4 chronic kidney disease, or unspecified chronic kidney disease: Secondary | ICD-10-CM | POA: Insufficient documentation

## 2012-12-25 DIAGNOSIS — I739 Peripheral vascular disease, unspecified: Secondary | ICD-10-CM | POA: Diagnosis present

## 2012-12-25 DIAGNOSIS — E1122 Type 2 diabetes mellitus with diabetic chronic kidney disease: Secondary | ICD-10-CM

## 2012-12-25 DIAGNOSIS — E1129 Type 2 diabetes mellitus with other diabetic kidney complication: Secondary | ICD-10-CM | POA: Insufficient documentation

## 2012-12-25 DIAGNOSIS — E1169 Type 2 diabetes mellitus with other specified complication: Secondary | ICD-10-CM | POA: Diagnosis present

## 2012-12-25 DIAGNOSIS — E785 Hyperlipidemia, unspecified: Secondary | ICD-10-CM | POA: Insufficient documentation

## 2012-12-25 DIAGNOSIS — M109 Gout, unspecified: Secondary | ICD-10-CM

## 2012-12-25 DIAGNOSIS — Z23 Encounter for immunization: Secondary | ICD-10-CM | POA: Insufficient documentation

## 2012-12-25 DIAGNOSIS — N182 Chronic kidney disease, stage 2 (mild): Secondary | ICD-10-CM | POA: Diagnosis present

## 2012-12-25 DIAGNOSIS — T783XXA Angioneurotic edema, initial encounter: Secondary | ICD-10-CM

## 2012-12-25 DIAGNOSIS — I10 Essential (primary) hypertension: Secondary | ICD-10-CM | POA: Diagnosis present

## 2012-12-25 DIAGNOSIS — I2510 Atherosclerotic heart disease of native coronary artery without angina pectoris: Secondary | ICD-10-CM | POA: Diagnosis present

## 2012-12-25 DIAGNOSIS — R220 Localized swelling, mass and lump, head: Principal | ICD-10-CM | POA: Insufficient documentation

## 2012-12-25 DIAGNOSIS — D472 Monoclonal gammopathy: Secondary | ICD-10-CM | POA: Diagnosis present

## 2012-12-25 DIAGNOSIS — X58XXXA Exposure to other specified factors, initial encounter: Secondary | ICD-10-CM | POA: Insufficient documentation

## 2012-12-25 DIAGNOSIS — R001 Bradycardia, unspecified: Secondary | ICD-10-CM

## 2012-12-25 LAB — BASIC METABOLIC PANEL
BUN: 25 mg/dL — ABNORMAL HIGH (ref 6–23)
CO2: 25 mEq/L (ref 19–32)
Calcium: 9.1 mg/dL (ref 8.4–10.5)
Chloride: 106 mEq/L (ref 96–112)
Creatinine, Ser: 1.57 mg/dL — ABNORMAL HIGH (ref 0.50–1.35)
GFR calc Af Amer: 46 mL/min — ABNORMAL LOW (ref >90)
GFR calc non Af Amer: 40 mL/min — ABNORMAL LOW (ref >90)
Glucose, Bld: 94 mg/dL (ref 70–99)
Potassium: 4.5 mEq/L (ref 3.5–5.1)
Sodium: 138 mEq/L (ref 135–145)

## 2012-12-25 LAB — CBC WITH DIFFERENTIAL/PLATELET
Basophils Absolute: 0 10*3/uL (ref 0.0–0.1)
Basophils Relative: 1 % (ref 0–1)
Eosinophils Absolute: 0.4 10*3/uL (ref 0.0–0.7)
Eosinophils Relative: 6 % — ABNORMAL HIGH (ref 0–5)
HCT: 34.3 % — ABNORMAL LOW (ref 39.0–52.0)
Hemoglobin: 11.6 g/dL — ABNORMAL LOW (ref 13.0–17.0)
Lymphocytes Relative: 19 % (ref 12–46)
Lymphs Abs: 1.2 10*3/uL (ref 0.7–4.0)
MCH: 31 pg (ref 26.0–34.0)
MCHC: 33.8 g/dL (ref 30.0–36.0)
MCV: 91.7 fL (ref 78.0–100.0)
Monocytes Absolute: 0.7 10*3/uL (ref 0.1–1.0)
Monocytes Relative: 11 % (ref 3–12)
Neutro Abs: 3.9 10*3/uL (ref 1.7–7.7)
Neutrophils Relative %: 64 % (ref 43–77)
Platelets: 320 10*3/uL (ref 150–400)
RBC: 3.74 MIL/uL — ABNORMAL LOW (ref 4.22–5.81)
RDW: 14.1 % (ref 11.5–15.5)
WBC: 6.1 10*3/uL (ref 4.0–10.5)

## 2012-12-25 MED ORDER — METHYLPREDNISOLONE SODIUM SUCC 125 MG IJ SOLR
125.0000 mg | Freq: Once | INTRAMUSCULAR | Status: AC
  Administered 2012-12-25: 125 mg via INTRAVENOUS
  Filled 2012-12-25: qty 2

## 2012-12-25 MED ORDER — FAMOTIDINE IN NACL 20-0.9 MG/50ML-% IV SOLN
20.0000 mg | Freq: Once | INTRAVENOUS | Status: AC
  Administered 2012-12-25: 20 mg via INTRAVENOUS
  Filled 2012-12-25: qty 50

## 2012-12-25 MED ORDER — DIPHENHYDRAMINE HCL 50 MG/ML IJ SOLN
25.0000 mg | Freq: Once | INTRAMUSCULAR | Status: AC
  Administered 2012-12-25: 25 mg via INTRAVENOUS
  Filled 2012-12-25: qty 1

## 2012-12-25 MED ORDER — SODIUM CHLORIDE 0.9 % IV SOLN
INTRAVENOUS | Status: DC
  Administered 2012-12-25 – 2012-12-26 (×2): via INTRAVENOUS

## 2012-12-25 NOTE — ED Notes (Signed)
Upper and lower lips swollen since this am getting worse this afternoon.  Previous history of the same

## 2012-12-25 NOTE — ED Provider Notes (Signed)
CSN: [id number]     Arrival date & time 12/25/12  1910 History   First MD Initiated Contact with Patient 12/25/12 1952     Chief Complaint  Patient presents with  . lips swollen    (Consider location/radiation/quality/duration/timing/severity/associated sxs/prior Treatment) HPI Comments: Kevin Schwartz is a 79 y.o. male who noticed swelling in the right cheek 2 days ago. Today, the swelling worsened and became generalized on both sides of the face and the upper and lower lips. He has never had this previously. He denies shortness of breath, weakness, dizziness, nausea, vomiting, fever, chills, cough, or chest pain. He's been eating well today. There are no other known modifying factors.   The history is provided by the patient.    Past Medical History  Diagnosis Date  . Renal insufficiency   . CAD (coronary artery disease)     PTCA of OM in 1998, BMS OM in 2000, Cath 9/07 LM ok LAD ok. LCX 95% in OM prior to previous stent RCA. nondominant normal EF normal. Cypher DES to OM 2007 (PLACED PROXIAMAL TO PREVIOUS STENT)  . DM type 2 (diabetes mellitus, type 2)   . HLD (hyperlipidemia)   . PVD (peripheral vascular disease)   . HTN (hypertension)   . MGUS (monoclonal gammopathy of unknown significance) 08/01/2011    IgG kappa dx 2002 8% plasma cells in bone marrow; no lesions on bone X-rays;  . S/P coronary artery stent placement 08/01/2011  . 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 Peripheral vascular disease, unspecified   . Personality change due to conditions classified elsewhere   . Loss of weight   . Depression   . Urinary tract infection, site not specified   . Memory loss   . Chronic kidney disease, stage III (moderate)   . Chronic kidney disease, unspecified   . Spasm of muscle   . Unspecified disorder of kidney and ureter   . Thyroid disease   . Unspecified hearing loss   . Other atopic dermatitis and related conditions   . Monoclonal paraproteinemia   .  Osteoarthrosis, unspecified whether generalized or localized, unspecified site   . Gout, unspecified   . Coronary atherosclerosis of unspecified type of vessel, native or graft    Past Surgical History  Procedure Laterality Date  . Carotid stent    . Coronary angioplasty    . Knee arthroplasty      total  . Joint replacement      left knee   Family History  Problem Relation Age of Onset  . Diabetes    . Coronary artery disease    . Cancer    . Heart disease Mother   . Heart disease Sister   . Heart disease Sister   . Cancer Sister   . Hypertension Sister   . Diabetes Brother   . Diabetes Brother    History  Substance Use Topics  . Smoking status: Former Smoker    Quit date: 10/15/2002  . Smokeless tobacco: Never Used  . Alcohol Use: No    Review of Systems  All other systems reviewed and are negative.    Allergies  Review of patient's allergies indicates no known allergies.  Home Medications   Current Outpatient Rx  Name  Route  Sig  Dispense  Refill  . allopurinol (ZYLOPRIM) 100 MG tablet   Oral   Take 1 tablet (100 mg total) by mouth daily.   30 tablet   6   .  amLODipine (NORVASC) 2.5 MG tablet   Oral   Take 2.5 mg by mouth daily.         [name] aspirin 81 MG tablet   Oral   Take 81 mg by mouth daily.         . citalopram (CELEXA) 20 MG tablet   Oral   Take 1 tablet (20 mg total) by mouth daily.   30 tablet   6   . clopidogrel (PLAVIX) 75 MG tablet   Oral   Take 1 tablet (75 mg total) by mouth daily.   30 tablet   6   . losartan (COZAAR) 25 MG tablet   Oral   Take 25 mg by mouth daily.         . metFORMIN (GLUCOPHAGE) 500 MG tablet   Oral   Take 1 tablet (500 mg total) by mouth daily with breakfast.   30 tablet   6   . metoprolol (LOPRESSOR) 50 MG tablet   Oral   Take 1 tablet (50 mg total) by mouth 2 (two) times daily.   60 tablet   6   . rosuvastatin (CRESTOR) 5 MG tablet   Oral   Take 1 tablet (5 mg total) by mouth  daily.   30 tablet   6   . nitroGLYCERIN (NITROSTAT) 0.4 MG SL tablet   Sublingual   Place 0.4 mg under the tongue every 5 (five) minutes as needed for chest pain. Up to 3 doses          BP 143/64  Pulse 55  Temp(Src) 97.8 F (36.6 C) (Oral)  Resp 16  Wt 186 lb 14.4 oz (84.777 kg)  BMI 28.42 kg/m2  SpO2 97% Physical Exam  Nursing note and vitals reviewed. Constitutional: He is oriented to person, place, and time. He appears well-developed and well-nourished.  HENT:  Head: Normocephalic and atraumatic.  Right Ear: External ear normal.  Left Ear: External ear normal.  Mild angioedema, upper and lower lips and bilateral cheek. There is no lingual swelling. There is no sublingual swelling. The oral airway is intact.  Eyes: Conjunctivae and EOM are normal. Pupils are equal, round, and reactive to light.  Neck: Normal range of motion and phonation normal. Neck supple.  Cardiovascular: Normal rate, regular rhythm, normal heart sounds and intact distal pulses.   Pulmonary/Chest: Effort normal and breath sounds normal. He exhibits no bony tenderness.  Abdominal: Soft. Normal appearance. There is no tenderness.  Musculoskeletal: Normal range of motion.  Neurological: He is alert and oriented to person, place, and time. He has normal strength. No cranial nerve deficit or sensory deficit. He exhibits normal muscle tone. Coordination normal.  Skin: Skin is warm, dry and intact.  Psychiatric: He has a normal mood and affect. His behavior is normal. Judgment and thought content normal.    ED Course  Procedures (including critical care time)  Medications  0.9 %  sodium chloride infusion ( Intravenous New Bag/Given 12/25/12 2116)  methylPREDNISolone sodium succinate (SOLU-MEDROL) 125 mg/2 mL injection 125 mg (125 mg Intravenous Given 12/25/12 2117)  diphenhydrAMINE (BENADRYL) injection 25 mg (25 mg Intravenous Given 12/25/12 2117)  famotidine (PEPCID) IVPB 20 mg (0 mg Intravenous Stopped  12/25/12 2154)    Patient Vitals for the past 24 hrs:  BP Temp Temp src Pulse Resp SpO2 Weight  12/26/12 0015 143/64 mmHg - - 55 - 97 % -  12/26/12 0000 141/68 mmHg - - 56 - 97 % -  12/25/12 2345 134/59 mmHg - -  54 - 98 % -  12/25/12 2330 155/69 mmHg - - 56 - 98 % -  12/25/12 2315 151/68 mmHg - - 54 - 96 % -  12/25/12 2300 153/73 mmHg - - 55 - 98 % -  12/25/12 2245 155/73 mmHg - - 54 - 98 % -  12/25/12 2230 157/69 mmHg - - 53 - 98 % -  12/25/12 2215 158/72 mmHg - - 54 - 99 % -  12/25/12 2200 156/63 mmHg - - 52 - 98 % -  12/25/12 2145 159/67 mmHg - - 51 - 99 % -  12/25/12 2130 155/67 mmHg - - 55 - 99 % -  12/25/12 2115 142/55 mmHg - - 55 - 100 % -  12/25/12 2100 125/53 mmHg - - 56 - 99 % -  12/25/12 2045 134/51 mmHg - - 58 - 100 % -  12/25/12 2030 128/53 mmHg - - 53 - 98 % -  12/25/12 2015 126/52 mmHg - - 63 - 100 % -  12/25/12 2000 127/53 mmHg - - 53 - 99 % -  12/25/12 1945 129/48 mmHg - - 53 - 97 % -  12/25/12 1936 128/56 mmHg 97.8 F (36.6 C) Oral 54 16 99 % -  12/25/12 1930 128/56 mmHg - - 52 - 99 % -  12/25/12 1926 139/56 mmHg - - 53 - 98 % -  12/25/12 1918 151/60 mmHg 98 F (36.7 C) Oral 58 18 100 % 186 lb 14.4 oz (84.777 kg)     Labs Review Labs Reviewed  CBC WITH DIFFERENTIAL - Abnormal; Notable for the following:    RBC 3.74 (*)    Hemoglobin 11.6 (*)    HCT 34.3 (*)    Eosinophils Relative 6 (*)    All other components within normal limits  BASIC METABOLIC PANEL - Abnormal; Notable for the following:    BUN 25 (*)    Creatinine, Ser 1.57 (*)    GFR calc non Af Amer 40 (*)    GFR calc Af Amer 46 (*)    All other components within normal limits   Imaging Review Dg Chest Port 1 View  12/25/2012   CLINICAL DATA:  Facial swelling.  EXAM: PORTABLE CHEST - 1 VIEW  COMPARISON:  11/25/2007  FINDINGS: Heart is borderline in size. No confluent airspace opacities, effusions or edema. Degenerative changes in the shoulders. No acute bony abnormality.  IMPRESSION: No active  disease.   Electronically Signed   By: Rolm Baptise M.D.   On: 12/25/2012 21:28    MDM   1. Angioedema, initial encounter     Angioedema related to use of ARB. He needs to be admitted for observation overnight to ensure that he does not worsen. He may need to be started on additional antihypertensive agent.  Nursing Notes Reviewed/ Care Coordinated, and agree without changes. Applicable Imaging Reviewed.  Interpretation of Laboratory Data incorporated into ED treatment  Plan: Admit    Richarda Blade, MD 12/26/12 [phone]

## 2012-12-25 NOTE — ED Notes (Signed)
The pt has had a previous  Episode.  No pain  He takes bp meds.  No breathing difficulty

## 2012-12-26 DIAGNOSIS — N182 Chronic kidney disease, stage 2 (mild): Secondary | ICD-10-CM

## 2012-12-26 DIAGNOSIS — E1129 Type 2 diabetes mellitus with other diabetic kidney complication: Secondary | ICD-10-CM

## 2012-12-26 DIAGNOSIS — T783XXA Angioneurotic edema, initial encounter: Secondary | ICD-10-CM

## 2012-12-26 DIAGNOSIS — I10 Essential (primary) hypertension: Secondary | ICD-10-CM

## 2012-12-26 DIAGNOSIS — Z5189 Encounter for other specified aftercare: Secondary | ICD-10-CM

## 2012-12-26 DIAGNOSIS — M109 Gout, unspecified: Secondary | ICD-10-CM

## 2012-12-26 LAB — COMPREHENSIVE METABOLIC PANEL
ALT: 11 U/L (ref 0–53)
AST: 17 U/L (ref 0–37)
Albumin: 3.2 g/dL — ABNORMAL LOW (ref 3.5–5.2)
Alkaline Phosphatase: 65 U/L (ref 39–117)
BUN: 26 mg/dL — ABNORMAL HIGH (ref 6–23)
CO2: 19 mEq/L (ref 19–32)
Calcium: 8.4 mg/dL (ref 8.4–10.5)
Chloride: 106 mEq/L (ref 96–112)
Creatinine, Ser: 1.45 mg/dL — ABNORMAL HIGH (ref 0.50–1.35)
GFR calc Af Amer: 51 mL/min — ABNORMAL LOW (ref >90)
GFR calc non Af Amer: 44 mL/min — ABNORMAL LOW (ref >90)
Glucose, Bld: 202 mg/dL — ABNORMAL HIGH (ref 70–99)
Potassium: 4.5 mEq/L (ref 3.5–5.1)
Sodium: 137 mEq/L (ref 135–145)
Total Bilirubin: 0.1 mg/dL — ABNORMAL LOW (ref 0.3–1.2)
Total Protein: 6.9 g/dL (ref 6.0–8.3)

## 2012-12-26 LAB — GLUCOSE, CAPILLARY
Glucose-Capillary: 182 mg/dL — ABNORMAL HIGH (ref 70–99)
Glucose-Capillary: 194 mg/dL — ABNORMAL HIGH (ref 70–99)
Glucose-Capillary: 134 mg/dL — ABNORMAL HIGH (ref 70–99)
Glucose-Capillary: 222 mg/dL — ABNORMAL HIGH (ref 70–99)
Glucose-Capillary: 165 mg/dL — ABNORMAL HIGH (ref 70–99)

## 2012-12-26 LAB — CBC
HCT: 36.5 % — ABNORMAL LOW (ref 39.0–52.0)
Hemoglobin: 12 g/dL — ABNORMAL LOW (ref 13.0–17.0)
MCH: 30.5 pg (ref 26.0–34.0)
MCHC: 32.9 g/dL (ref 30.0–36.0)
MCV: 92.6 fL (ref 78.0–100.0)
Platelets: 328 10*3/uL (ref 150–400)
RBC: 3.94 MIL/uL — ABNORMAL LOW (ref 4.22–5.81)
RDW: 14.1 % (ref 11.5–15.5)
WBC: 5.7 10*3/uL (ref 4.0–10.5)

## 2012-12-26 LAB — PROTIME-INR
INR: 1.08 (ref 0.00–1.49)
Prothrombin Time: 13.8 seconds (ref 11.6–15.2)

## 2012-12-26 MED ORDER — DIPHENHYDRAMINE HCL 50 MG/ML IJ SOLN
25.0000 mg | Freq: Four times a day (QID) | INTRAMUSCULAR | Status: DC | PRN
Start: 2012-12-26 — End: 2012-12-27

## 2012-12-26 MED ORDER — ALLOPURINOL 100 MG PO TABS
100.0000 mg | ORAL_TABLET | Freq: Every day | ORAL | Status: DC
  Administered 2012-12-26 – 2012-12-27 (×2): 100 mg via ORAL
  Filled 2012-12-26 (×2): qty 1

## 2012-12-26 MED ORDER — NITROGLYCERIN 0.4 MG SL SUBL: 0.4000 mg | SUBLINGUAL_TABLET | SUBLINGUAL | Status: DC | PRN

## 2012-12-26 MED ORDER — FAMOTIDINE 20 MG PO TABS
20.0000 mg | ORAL_TABLET | Freq: Two times a day (BID) | ORAL | Status: DC
  Administered 2012-12-26 – 2012-12-27 (×2): 20 mg via ORAL
  Filled 2012-12-26 (×3): qty 1

## 2012-12-26 MED ORDER — PREDNISONE 50 MG PO TABS
50.0000 mg | ORAL_TABLET | Freq: Every day | ORAL | Status: DC
  Administered 2012-12-26: 50 mg via ORAL
  Filled 2012-12-26 (×3): qty 1

## 2012-12-26 MED ORDER — INSULIN ASPART 100 UNIT/ML ~~LOC~~ SOLN: 0.0000 [IU] | Freq: Every day | SUBCUTANEOUS | Status: DC

## 2012-12-26 MED ORDER — DIPHENHYDRAMINE HCL 25 MG PO CAPS
25.0000 mg | ORAL_CAPSULE | Freq: Two times a day (BID) | ORAL | Status: DC
  Administered 2012-12-26 – 2012-12-27 (×4): 25 mg via ORAL
  Filled 2012-12-26 (×6): qty 1

## 2012-12-26 MED ORDER — ONDANSETRON HCL 4 MG/2ML IJ SOLN: 4.0000 mg | Freq: Four times a day (QID) | INTRAMUSCULAR | Status: DC | PRN

## 2012-12-26 MED ORDER — CITALOPRAM HYDROBROMIDE 20 MG PO TABS
20.0000 mg | ORAL_TABLET | Freq: Every day | ORAL | Status: DC
  Administered 2012-12-26 – 2012-12-27 (×2): 20 mg via ORAL
  Filled 2012-12-26 (×2): qty 1

## 2012-12-26 MED ORDER — AMLODIPINE BESYLATE 2.5 MG PO TABS
2.5000 mg | ORAL_TABLET | Freq: Every day | ORAL | Status: DC
  Administered 2012-12-26 – 2012-12-27 (×2): 2.5 mg via ORAL
  Filled 2012-12-26 (×2): qty 1

## 2012-12-26 MED ORDER — INSULIN ASPART 100 UNIT/ML ~~LOC~~ SOLN
0.0000 [IU] | Freq: Three times a day (TID) | SUBCUTANEOUS | Status: DC
  Administered 2012-12-26: 5 [IU] via SUBCUTANEOUS
  Administered 2012-12-26: 2 [IU] via SUBCUTANEOUS
  Administered 2012-12-26: 3 [IU] via SUBCUTANEOUS
  Administered 2012-12-27: 2 [IU] via SUBCUTANEOUS
  Administered 2012-12-27: 3 [IU] via SUBCUTANEOUS

## 2012-12-26 MED ORDER — METOPROLOL TARTRATE 50 MG PO TABS
50.0000 mg | ORAL_TABLET | Freq: Two times a day (BID) | ORAL | Status: DC
  Administered 2012-12-26 – 2012-12-27 (×4): 50 mg via ORAL
  Filled 2012-12-26 (×6): qty 1

## 2012-12-26 MED ORDER — CLOPIDOGREL BISULFATE 75 MG PO TABS
75.0000 mg | ORAL_TABLET | Freq: Every day | ORAL | Status: DC
  Administered 2012-12-26 – 2012-12-27 (×2): 75 mg via ORAL
  Filled 2012-12-26 (×4): qty 1

## 2012-12-26 MED ORDER — SODIUM CHLORIDE 0.9 % IJ SOLN
3.0000 mL | Freq: Two times a day (BID) | INTRAMUSCULAR | Status: DC
  Administered 2012-12-26 – 2012-12-27 (×2): 3 mL via INTRAVENOUS

## 2012-12-26 MED ORDER — METHYLPREDNISOLONE SODIUM SUCC 40 MG IJ SOLR
40.0000 mg | Freq: Three times a day (TID) | INTRAMUSCULAR | Status: DC
  Administered 2012-12-26 – 2012-12-27 (×3): 40 mg via INTRAVENOUS
  Filled 2012-12-26 (×6): qty 1

## 2012-12-26 MED ORDER — HEPARIN SODIUM (PORCINE) 5000 UNIT/ML IJ SOLN
5000.0000 [IU] | Freq: Three times a day (TID) | INTRAMUSCULAR | Status: DC
  Filled 2012-12-26 (×7): qty 1

## 2012-12-26 MED ORDER — FAMOTIDINE IN NACL 20-0.9 MG/50ML-% IV SOLN
20.0000 mg | Freq: Two times a day (BID) | INTRAVENOUS | Status: DC
  Administered 2012-12-26: 20 mg via INTRAVENOUS
  Filled 2012-12-26 (×3): qty 50

## 2012-12-26 MED ORDER — ONDANSETRON HCL 4 MG PO TABS: 4.0000 mg | ORAL_TABLET | Freq: Four times a day (QID) | ORAL | Status: DC | PRN

## 2012-12-26 MED ORDER — ASPIRIN 81 MG PO CHEW
81.0000 mg | CHEWABLE_TABLET | Freq: Every day | ORAL | Status: DC
  Administered 2012-12-26 – 2012-12-27 (×2): 81 mg via ORAL
  Filled 2012-12-26 (×2): qty 1

## 2012-12-26 NOTE — H&P (Signed)
[facility] History and Physical  Patient: Kevin Schwartz  [id number]  DOB: 04/27/1930  DOA: 12/25/2012  Referring physician: Dr Eulis Foster PCP: Hollace Kinnier, DO  Consults:   none  Chief Complaint: Swelling of lips  HPI: Kevin Schwartz is a 79 y.o. male with Past medical history of is, diabetes mellitus, dyslipidemia, but there was disease, chronic kidney disease, hypertension, and MGUS. He presented today with the complaint of swelling of his lips. He mentions that since last 2 days he has noted swelling of his lips both upper and lower already had the same time progressively worsening. He denies any similar episodes in the past. He denies any pain or tingling and numbness around the lips.  He denies any difficulty swallowing food or shortness of breath. He mentions that he has been taking Crestor since last one month, Lopressor since last 6 months and rest of the medications are along the medications. He denies any outside new food or any bee stings. He denies any change of pharmacy.  Review of Systems: as mentioned in the history of present illness.  A Comprehensive review of the other systems is negative.  Past Medical History  Diagnosis Date  . Renal insufficiency   . CAD (coronary artery disease)     PTCA of OM in 1998, BMS OM in 2000, Cath 9/07 LM ok LAD ok. LCX 95% in OM prior to previous stent RCA. nondominant normal EF normal. Cypher DES to OM 2007 (PLACED PROXIAMAL TO PREVIOUS STENT)  . DM type 2 (diabetes mellitus, type 2)   . HLD (hyperlipidemia)   . PVD (peripheral vascular disease)   . HTN (hypertension)   . MGUS (monoclonal gammopathy of unknown significance) 08/01/2011    IgG kappa dx 2002 8% plasma cells in bone marrow; no lesions on bone X-rays;  . S/P coronary artery stent placement 08/01/2011  . 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 Peripheral vascular disease, unspecified   . Personality change due to conditions classified elsewhere   . Loss of  weight   . Depression   . Urinary tract infection, site not specified   . Memory loss   . Chronic kidney disease, stage III (moderate)   . Chronic kidney disease, unspecified   . Spasm of muscle   . Unspecified disorder of kidney and ureter   . Thyroid disease   . Unspecified hearing loss   . Other atopic dermatitis and related conditions   . Monoclonal paraproteinemia   . Osteoarthrosis, unspecified whether generalized or localized, unspecified site   . Gout, unspecified   . Coronary atherosclerosis of unspecified type of vessel, native or graft    Past Surgical History  Procedure Laterality Date  . Carotid stent    . Coronary angioplasty    . Knee arthroplasty      total  . Joint replacement      left knee   Social History:  reports that he quit smoking about 10 years ago. He has never used smokeless tobacco. He reports that he does not drink alcohol or use illicit drugs. Patient is coming from home. Independent for most of his  ADL.  No Known Allergies  Family History  Problem Relation Age of Onset  . Diabetes    . Coronary artery disease    . Cancer    . Heart disease Mother   . Heart disease Sister   . Heart disease Sister   . Cancer Sister   . Hypertension Sister   .  Diabetes Brother   . Diabetes Brother     Prior to Admission medications   Medication Sig Start Date End Date Taking? Authorizing Provider  allopurinol (ZYLOPRIM) 100 MG tablet Take 1 tablet (100 mg total) by mouth daily. 11/29/12  Yes Tiffany L Reed, DO  amLODipine (NORVASC) 2.5 MG tablet Take 2.5 mg by mouth daily.   Yes Historical Provider, MD  aspirin 81 MG tablet Take 81 mg by mouth daily.   Yes Historical Provider, MD  citalopram (CELEXA) 20 MG tablet Take 1 tablet (20 mg total) by mouth daily. 11/29/12  Yes Tiffany L Reed, DO  clopidogrel (PLAVIX) 75 MG tablet Take 1 tablet (75 mg total) by mouth daily. 11/29/12  Yes Tiffany L Reed, DO  losartan (COZAAR) 25 MG tablet Take 25 mg by mouth  daily.   Yes Historical Provider, MD  metFORMIN (GLUCOPHAGE) 500 MG tablet Take 1 tablet (500 mg total) by mouth daily with breakfast. 11/29/12  Yes Tiffany L Reed, DO  metoprolol (LOPRESSOR) 50 MG tablet Take 1 tablet (50 mg total) by mouth 2 (two) times daily. 11/29/12  Yes Tiffany L Reed, DO  rosuvastatin (CRESTOR) 5 MG tablet Take 1 tablet (5 mg total) by mouth daily. 11/29/12  Yes Tiffany L Reed, DO  nitroGLYCERIN (NITROSTAT) 0.4 MG SL tablet Place 0.4 mg under the tongue every 5 (five) minutes as needed for chest pain. Up to 3 doses    Historical Provider, MD    Physical Exam: Filed Vitals:   12/26/12 0030 12/26/12 0045 12/26/12 0100 12/26/12 0131  BP: 140/69 136/70 145/66 141/64  Pulse: 54 63 62 64  Temp:    97.4 F (36.3 C)  TempSrc:    Oral  Resp:    16  Weight:      SpO2: 97% 97% 98% 98%    General: Alert, Awake and Oriented to Time, Place and Person. Appear in no distress Eyes: PERRL ENT: Oral Mucosa clear moist., Bilaterally symmetrical swelling of the both upper lips, there is also swelling of the gums. No pharyngeal or soft palate involvement. Neck:  No stridor, no  JVD, no Carotid Bruits  Cardiovascular: S1 and S2 Present, no  Murmur, Peripheral Pulses Present Respiratory: Bilateral Air entry equal and Decreased, Clear to Auscultation,  No  Crackles,no wheezes Abdomen: Bowel Sound Present, Soft and Non tender Skin: no Rash Extremities: no Pedal edema, no calf tenderness Neurologic: Grossly Unremarkable.  Labs on Admission:  CBC:  Recent Labs Lab 12/25/12 2046  WBC 6.1  NEUTROABS 3.9  HGB 11.6*  HCT 34.3*  MCV 91.7  PLT 320    CMP     Component Value Date/Time   NA 138 12/25/2012 2046   NA 139 11/25/2012 0919   NA 139 07/23/2012 1031   K 4.5 12/25/2012 2046   K 4.0 07/23/2012 1031   CL 106 12/25/2012 2046   CL 108* 07/23/2012 1031   CO2 25 12/25/2012 2046   CO2 22 07/23/2012 1031   GLUCOSE 94 12/25/2012 2046   GLUCOSE 96 11/25/2012 0919   GLUCOSE 116*  07/23/2012 1031   BUN 25* 12/25/2012 2046   BUN 16 11/25/2012 0919   BUN 28.5* 07/23/2012 1031   CREATININE 1.57* 12/25/2012 2046   CREATININE 1.65* 07/26/2012 1116   CREATININE 1.7* 07/23/2012 1031   CALCIUM 9.1 12/25/2012 2046   CALCIUM 9.0 07/23/2012 1031   PROT 7.3 11/25/2012 0919   PROT 7.5 07/23/2012 1031   PROT 7.0 07/25/2011 1428   ALBUMIN 3.5 07/23/2012 1031  ALBUMIN 4.1 07/25/2011 1428   AST 18 11/25/2012 0919   AST 27 07/23/2012 1031   ALT 17 11/25/2012 0919   ALT 26 07/23/2012 1031   ALKPHOS 78 11/25/2012 0919   ALKPHOS 77 07/23/2012 1031   BILITOT 0.3 11/25/2012 0919   BILITOT 0.30 07/23/2012 1031   GFRNONAA 40* 12/25/2012 2046   GFRAA 46* 12/25/2012 2046    No results found for this basename: LIPASE, AMYLASE,  in the last 168 hours No results found for this basename: AMMONIA,  in the last 168 hours  Cardiac Enzymes: No results found for this basename: CKTOTAL, CKMB, CKMBINDEX, TROPONINI,  in the last 168 hours  BNP (last 3 results) No results found for this basename: PROBNP,  in the last 8760 hours  Radiological Exams on Admission: Dg Chest Port 1 View  12/25/2012   CLINICAL DATA:  Facial swelling.  EXAM: PORTABLE CHEST - 1 VIEW  COMPARISON:  11/25/2007  FINDINGS: Heart is borderline in size. No confluent airspace opacities, effusions or edema. Degenerative changes in the shoulders. No acute bony abnormality.  IMPRESSION: No active disease.   Electronically Signed   By: Rolm Baptise M.D.   On: 12/25/2012 21:28   Assessment/Plan Principal Problem:   Angioedema of lips Active Problems:   HYPERLIPIDEMIA-MIXED   HYPERTENSION, UNSPECIFIED   CAD, NATIVE VESSEL   PVD   MGUS (monoclonal gammopathy of unknown significance)   DM type 2 causing CKD stage 2   1. Angioedema of lips The patient presented with angioedema of lips based on the clinical examination without any involvement of pharynx or lower respiratory tract. He is maintaining his saturations and is hemodynamically stable  at present, as per the patient's symptoms are not progressing at present. He has received IV Solu-Medrol IV Benadryl and IV Pepcid in the ER. Patient will be admitted for observation under telemetry. Oral prednisone IV Benadryl as needed and an IV Pepcid will be provided. Patient recommended to call the nurse about alarming symptoms.   2. Hypertension Patient is on multiple antihypertensive at present his blood pressure is stable. There is no temporal association with any medication and his symptoms. Considering ARBs being the most common medication to cause angioedema of the patient's medication list I would hold losartan. I would continue Norvasc and Lopressor.  3. Dyslipidemia At present I would hold the Crestor as he recently has started taking it since last one month,  4. history of gout   patient is on allopurinol, considering his allergy symptoms at present I would hold that as well.  5. Diabetes mellitus Sliding scale    DVT Prophylaxis: subcutaneous Heparin Nutrition:  cardiac diet as tolerated  Code Status: full  Disposition: Admitted to observation telemetry.  Author: Berle Mull, MD [facility] Pager: [phone] 12/26/2012, 2:07 AM    If 7PM-7AM, please contact night-coverage [email or web address] Password [email or web address]

## 2012-12-26 NOTE — Progress Notes (Signed)
[facility] PROGRESS NOTE  Kevin Schwartz [id number] DOB: [birth year] DOA: 12/25/2012 PCP: Hollace Kinnier, DO  Assessment/Plan: Principal Problem:   Angioedema of lips Active Problems:   HYPERLIPIDEMIA-MIXED   HYPERTENSION, UNSPECIFIED   CAD, NATIVE VESSEL   PVD   MGUS (monoclonal gammopathy of unknown significance)   DM type 2 causing CKD stage 2    1. Angioedema of lips: Patient presented with progressive upper and lower lip swelling over a 2-day period, without dysphagia, or SOB. Physical examination revealed angioedema. Fortunately, there was no tongue involvement. The obvious culprits appear to be ARB vs Statin therapy. These medications have been discontinued. Managing with steroids, antihistaminics, and H2RA, with satisfactory clinical response, and some improvement is already evident. Will continue current treatment.  2. Hypertension: BP is reasonably controlled at this time, off ARB. Will observe for now, and address if indicated, with a substitute.  3. Dyslipidemia: As Crestor is a possible culprit for patient's angioedema, this has been discontinued.  4. History of gout: Stable/Asymptomatic. Continue Allopurinol.  5. Diabetes mellitus: Managing with diet/SSI.  6. CKD-3: Patient's baseline creatinine is 1.49-1.65. Renal indices appear currently at baseline.    Code Status: Full Code Family Communication:  Disposition Plan: To be determined   Brief narrative: 79 y.o. male with history of DM-2,, dyslipidemia, CKD-3, HTN, PVD s/p carotid stent, CAD, s/p PCI/Stent, Gout, OA, s/p Lt knee arthroplasty, MGUS, presenting with swelling of his lips. He mentions that since last 2 days he has noted swelling of both upper and lower lips, with progressive worsening. He denies any similar episodes in the past. He denies any pain or tingling and numbness around the lips, difficulty swallowing food or shortness of breath. Patient mentions that he has been taking Crestor since the  last one month, Lopressor since last 6 months. He denies any outside new food or any bee stings. He denies any change of pharmacy. Admitted for further management.    Consultants:  N/A.   Procedures:  CXR.   Antibiotics:  N/A.   HPI/Subjective: No new issues . Lip swelling is still present, but less.   Objective: Vital signs in last 24 hours: Temp:  [97.4 F (36.3 C)-98 F (36.7 C)] 97.7 F (36.5 C) (09/14 0542) Pulse Rate:  [51-70] 70 (09/14 1008) Resp:  [16-18] 17 (09/14 0542) BP: (122-159)/(48-73) 122/60 mmHg (09/14 0542) SpO2:  [96 %-100 %] 100 % (09/14 0542) Weight:  [84.777 kg (186 lb 14.4 oz)] 84.777 kg (186 lb 14.4 oz) (09/13 1918) Weight change:  Last BM Date: 12/24/12  Intake/Output from previous day: 09/13 0701 - 09/14 0700 In: 1535.7 [P.O.:444; I.V.:1091.7] Out: -      Physical Exam: General: Comfortable, alert, communicative, fully oriented, not short of breath at rest. Eating with gusto.  HEENT:  No clinical pallor, no jaundice, no conjunctival injection or discharge. Hydration is fair.  NECK:  Supple, JVP not seen, no carotid bruits, no palpable lymphadenopathy, no palpable goiter. CHEST:  Clinically clear to auscultation, no wheezes, no crackles. HEART:  Sounds 1 and 2 heard, normal, regular, no murmurs. ABDOMEN:  Full, soft, non-tender, no palpable organomegaly, no palpable masses, normal bowel sounds. GENITALIA:  Not examined. LOWER EXTREMITIES:  Minimal pitting edema, palpable peripheral pulses. MUSCULOSKELETAL SYSTEM:  Generalized osteoarthritic changes, otherwise, normal. CENTRAL NERVOUS SYSTEM:  No focal neurologic deficit on gross examination.  Lab Results:  Recent Labs  12/25/12 2046 12/26/12 0515  WBC 6.1 5.7  HGB 11.6* 12.0*  HCT 34.3* 36.5*  PLT 320 328  Recent Labs  12/25/12 2046 12/26/12 0515  NA 138 137  K 4.5 4.5  CL 106 106  CO2 25 19  GLUCOSE 94 202*  BUN 25* 26*  CREATININE 1.57* 1.45*  CALCIUM 9.1 8.4    No results found for this or any previous visit (from the past 240 hour(s)).   Studies/Results: Dg Chest Port 1 View  12/25/2012   CLINICAL DATA:  Facial swelling.  EXAM: PORTABLE CHEST - 1 VIEW  COMPARISON:  11/25/2007  FINDINGS: Heart is borderline in size. No confluent airspace opacities, effusions or edema. Degenerative changes in the shoulders. No acute bony abnormality.  IMPRESSION: No active disease.   Electronically Signed   By: Rolm Baptise M.D.   On: 12/25/2012 21:28    Medications: Scheduled Meds: . amLODipine  2.5 mg Oral Daily  . aspirin  81 mg Oral Daily  . citalopram  20 mg Oral Daily  . clopidogrel  75 mg Oral Q breakfast  . diphenhydrAMINE  25 mg Oral BID  . famotidine (PEPCID) IV  20 mg Intravenous Q12H  . heparin  5,000 Units Subcutaneous Q8H  . insulin aspart  0-15 Units Subcutaneous TID WC  . insulin aspart  0-5 Units Subcutaneous QHS  . metoprolol  50 mg Oral BID  . predniSONE  50 mg Oral Q breakfast  . sodium chloride  3 mL Intravenous Q12H   Continuous Infusions: . sodium chloride 125 mL/hr at 12/26/12 0523   PRN Meds:.diphenhydrAMINE, nitroGLYCERIN, ondansetron (ZOFRAN) IV, ondansetron    LOS: 1 day   OTI,CHRISTOPHER  [facility] Pager [phone]. If 8PM-8AM, please contact night-coverage at [email or web address], password [facility] 12/26/2012, 12:22 PM  LOS: 1 day

## 2012-12-27 DIAGNOSIS — Z9861 Coronary angioplasty status: Secondary | ICD-10-CM

## 2012-12-27 LAB — BASIC METABOLIC PANEL
BUN: 24 mg/dL — ABNORMAL HIGH (ref 6–23)
CO2: 21 mEq/L (ref 19–32)
Calcium: 8.7 mg/dL (ref 8.4–10.5)
Chloride: 108 mEq/L (ref 96–112)
Creatinine, Ser: 1.29 mg/dL (ref 0.50–1.35)
GFR calc Af Amer: 59 mL/min — ABNORMAL LOW (ref >90)
GFR calc non Af Amer: 51 mL/min — ABNORMAL LOW (ref >90)
Glucose, Bld: 110 mg/dL — ABNORMAL HIGH (ref 70–99)
Potassium: 4.5 mEq/L (ref 3.5–5.1)
Sodium: 139 mEq/L (ref 135–145)

## 2012-12-27 LAB — CBC
HCT: 37.5 % — ABNORMAL LOW (ref 39.0–52.0)
Hemoglobin: 12.4 g/dL — ABNORMAL LOW (ref 13.0–17.0)
MCH: 30.2 pg (ref 26.0–34.0)
MCHC: 33.1 g/dL (ref 30.0–36.0)
MCV: 91.5 fL (ref 78.0–100.0)
Platelets: 379 10*3/uL (ref 150–400)
RBC: 4.1 MIL/uL — ABNORMAL LOW (ref 4.22–5.81)
RDW: 14.1 % (ref 11.5–15.5)
WBC: 23.2 10*3/uL — ABNORMAL HIGH (ref 4.0–10.5)

## 2012-12-27 LAB — GLUCOSE, CAPILLARY
Glucose-Capillary: 124 mg/dL — ABNORMAL HIGH (ref 70–99)
Glucose-Capillary: 177 mg/dL — ABNORMAL HIGH (ref 70–99)

## 2012-12-27 LAB — C4 COMPLEMENT: Complement C4, Body Fluid: 36 mg/dL (ref 10–40)

## 2012-12-27 LAB — C3 COMPLEMENT: C3 Complement: 130 mg/dL (ref 90–180)

## 2012-12-27 MED ORDER — INFLUENZA VAC SPLIT QUAD 0.5 ML IM SUSP: 0.5000 mL | INTRAMUSCULAR | Status: DC

## 2012-12-27 MED ORDER — PREDNISONE 10 MG PO TABS: ORAL_TABLET | ORAL | Status: DC

## 2012-12-27 MED ORDER — FAMOTIDINE 20 MG PO TABS: 20.0000 mg | ORAL_TABLET | Freq: Two times a day (BID) | ORAL | Status: DC

## 2012-12-27 MED ORDER — PNEUMOCOCCAL VAC POLYVALENT 25 MCG/0.5ML IJ INJ: 0.5000 mL | INJECTION | INTRAMUSCULAR | Status: DC

## 2012-12-27 MED ORDER — INFLUENZA VAC SPLIT QUAD 0.5 ML IM SUSP
0.5000 mL | INTRAMUSCULAR | Status: AC
  Administered 2012-12-27: 0.5 mL via INTRAMUSCULAR
  Filled 2012-12-27: qty 0.5

## 2012-12-27 MED ORDER — PNEUMOCOCCAL VAC POLYVALENT 25 MCG/0.5ML IJ INJ
0.5000 mL | INJECTION | INTRAMUSCULAR | Status: AC
  Administered 2012-12-27: 0.5 mL via INTRAMUSCULAR
  Filled 2012-12-27: qty 0.5

## 2012-12-27 MED ORDER — DIPHENHYDRAMINE HCL 25 MG PO CAPS: 25.0000 mg | ORAL_CAPSULE | Freq: Three times a day (TID) | ORAL | Status: DC

## 2012-12-27 NOTE — Progress Notes (Signed)
Discharge home. Home discharge instruction given, no question verbalized. 

## 2012-12-27 NOTE — Discharge Summary (Signed)
Physician Discharge Summary  Kevin Schwartz [id number] DOB: 08-08-1930 DOA: 12/25/2012  PCP: Hollace Kinnier, DO  Admit date: 12/25/2012 Discharge date: 12/27/2012  Time spent: 40 minutes  Recommendations for Outpatient Follow-up:  1. Follow up with primary MD.   Discharge Diagnoses:  Principal Problem:   Angioedema of lips Active Problems:   HYPERLIPIDEMIA-MIXED   HYPERTENSION, UNSPECIFIED   CAD, NATIVE VESSEL   PVD   MGUS (monoclonal gammopathy of unknown significance)   DM type 2 causing CKD stage 2   Discharge Condition: Satisfactory.   Diet recommendation: Heart-Healthy/Carbohydrate-Modified.  Filed Weights   12/25/12 1918  Weight: 84.777 kg (186 lb 14.4 oz)    History of present illness:  79 y.o. male with history of DM-2,, dyslipidemia, CKD-3, HTN, PVD s/p carotid stent, CAD, s/p PCI/Stent, Gout, OA, s/p Lt knee arthroplasty, MGUS, presenting with swelling of his lips. He mentions that since last 2 days he has noted swelling of both upper and lower lips, with progressive worsening. He denies any similar episodes in the past. He denies any pain or tingling and numbness around the lips, difficulty swallowing food or shortness of breath. Patient mentions that he has been taking Crestor since the last one month, Lopressor since last 6 months. He denies any outside new food or any bee stings. He denies any change of pharmacy. Admitted for further management.    Hospital Course:  1. Angioedema of lips: Patient presented with progressive upper and lower lip swelling over a 2-day period, without dysphagia, or SOB. Physical examination revealed angioedema. Fortunately, there was no tongue involvement. The obvious culprits appear to be ARB vs Statin therapy. These medications were discontinued. Managed with steroids, antihistaminics, and H2RA, with satisfactory clinical response, and resolution of angioedema. Transitioned to oral steroid taper, effective 12/27/12.  2. Hypertension:  BP remained reasonably controlled, off ARB. Continued on Amlodipine and beta-blocker.  3. Dyslipidemia: As Crestor is a possible culprit for patient's angioedema, this has been discontinued.  4. History of gout: Stable/Asymptomatic. Continued on Allopurinol.  5. Diabetes mellitus: Managed with diet/SSI. Reasonably controlled. 6. CKD-3: Patient's baseline creatinine is 1.49-1.65. Renal indices appear currently at baseline.    Procedures:  See Below.   Consultations:  N/A.   Discharge Exam: Filed Vitals:   12/27/12 1004  BP: 129/50  Pulse: 66  Temp:   Resp:     General: Comfortable, alert, communicative, fully oriented, not short of breath at rest.  HEENT: No clinical pallor, no jaundice, no conjunctival injection or discharge. Hydration is fair. Lip angioedema has resolved. NECK: Supple, JVP not seen, no carotid bruits, no palpable lymphadenopathy, no palpable goiter.  CHEST: Clinically clear to auscultation, no wheezes, no crackles.  HEART: Sounds 1 and 2 heard, normal, regular, no murmurs.  ABDOMEN: Full, soft, non-tender, no palpable organomegaly, no palpable masses, normal bowel sounds.  GENITALIA: Not examined.  LOWER EXTREMITIES: Minimal pitting edema, palpable peripheral pulses.  MUSCULOSKELETAL SYSTEM: Generalized osteoarthritic changes, otherwise, normal.  CENTRAL NERVOUS SYSTEM: No focal neurologic deficit on gross examination.  Discharge Instructions      Discharge Orders   Future Appointments Provider Department Dept Phone   10/06/2013 10:00 AM [facility] [place] [phone]   10/17/2013 2:00 PM Annia Belt, MD [place] [phone]   Future Orders Complete By Expires   Diet - low sodium heart healthy  As directed    Diet Carb Modified  As directed    Increase activity slowly  As directed        Medication List    STOP taking these medications       losartan 25 MG tablet   Commonly known as:  COZAAR     rosuvastatin 5 MG tablet  Commonly known as:  CRESTOR      TAKE these medications       allopurinol 100 MG tablet  Commonly known as:  ZYLOPRIM  Take 1 tablet (100 mg total) by mouth daily.     amLODipine 2.5 MG tablet  Commonly known as:  NORVASC  Take 2.5 mg by mouth daily.     aspirin 81 MG tablet  Take 81 mg by mouth daily.     citalopram 20 MG tablet  Commonly known as:  CELEXA  Take 1 tablet (20 mg total) by mouth daily.     clopidogrel 75 MG tablet  Commonly known as:  PLAVIX  Take 1 tablet (75 mg total) by mouth daily.     diphenhydrAMINE 25 mg capsule  Commonly known as:  BENADRYL  Take 1 capsule (25 mg total) by mouth 3 (three) times daily.     famotidine 20 MG tablet  Commonly known as:  PEPCID  Take 1 tablet (20 mg total) by mouth 2 (two) times daily.     metFORMIN 500 MG tablet  Commonly known as:  GLUCOPHAGE  Take 1 tablet (500 mg total) by mouth daily with breakfast.     metoprolol 50 MG tablet  Commonly known as:  LOPRESSOR  Take 1 tablet (50 mg total) by mouth 2 (two) times daily.     nitroGLYCERIN 0.4 MG SL tablet  Commonly known as:  NITROSTAT  Place 0.4 mg under the tongue every 5 (five) minutes as needed for chest pain. Up to 3 doses     predniSONE 10 MG tablet  Commonly known as:  DELTASONE  Take 40 mg daily for 2 days, then 30 mg daily for 2 days, then 20 mg daily for 2 days, then 10 mg daily for 2 days, then stop.       No Known Allergies Follow-up Information   Schedule an appointment as soon as possible for a visit with REED, TIFFANY, DO.   Specialty:  Geriatric Medicine   Contact information:   [address]. [place] [address] [phone]        The results of significant diagnostics from this hospitalization (including imaging, microbiology, ancillary and laboratory) are listed below for reference.    Significant Diagnostic Studies: Dg Chest Port 1 View  12/25/2012   CLINICAL DATA:   Facial swelling.  EXAM: PORTABLE CHEST - 1 VIEW  COMPARISON:  11/25/2007  FINDINGS: Heart is borderline in size. No confluent airspace opacities, effusions or edema. Degenerative changes in the shoulders. No acute bony abnormality.  IMPRESSION: No active disease.   Electronically Signed   By: Rolm Baptise M.D.   On: 12/25/2012 21:28    Microbiology: No results found for this or any previous visit (from the past 240 hour(s)).   Labs: Basic Metabolic Panel:  Recent Labs Lab 12/25/12 2046 12/26/12 0515 12/27/12 0800  NA 138 137 139  K 4.5 4.5 4.5  CL 106 106 108  CO2 25 19 21   GLUCOSE 94 202* 110*  BUN 25* 26* 24*  CREATININE 1.57* 1.45* 1.29  CALCIUM 9.1 8.4 8.7   Liver Function Tests:  Recent Labs Lab 12/26/12 0515  AST 17  ALT 11  ALKPHOS 65  BILITOT 0.1*  PROT 6.9  ALBUMIN 3.2*   No results found for this basename: LIPASE, AMYLASE,  in the last 168 hours No results found for this basename: AMMONIA,  in the last 168 hours CBC:  Recent Labs Lab 12/25/12 2046 12/26/12 0515 12/27/12 0800  WBC 6.1 5.7 23.2*  NEUTROABS 3.9  --   --   HGB 11.6* 12.0* 12.4*  HCT 34.3* 36.5* 37.5*  MCV 91.7 92.6 91.5  PLT 320 328 379   Cardiac Enzymes: No results found for this basename: CKTOTAL, CKMB, CKMBINDEX, TROPONINI,  in the last 168 hours BNP: BNP (last 3 results) No results found for this basename: PROBNP,  in the last 8760 hours CBG:  Recent Labs Lab 12/26/12 1215 12/26/12 1724 12/26/12 2115 12/27/12 0812 12/27/12 1126  GLUCAP 134* 222* 165* 124* 177*       Signed:  OTI,CHRISTOPHER  [facility] 12/27/2012, 12:11 PM

## 2013-02-14 ENCOUNTER — Ambulatory Visit (HOSPITAL_COMMUNITY)
Admission: RE | Admit: 2013-02-14 | Discharge: 2013-02-14 | Disposition: A | Discharge status: Home / Self Care | Payer: Medicare Other | Source: Ambulatory Visit | Attending: Internal Medicine | Admitting: Internal Medicine

## 2013-02-14 VITALS — BP 148/68 | HR 80 | Wt 184.0 lb

## 2013-02-14 DIAGNOSIS — I10 Essential (primary) hypertension: Secondary | ICD-10-CM | POA: Insufficient documentation

## 2013-02-14 DIAGNOSIS — E785 Hyperlipidemia, unspecified: Secondary | ICD-10-CM | POA: Insufficient documentation

## 2013-02-14 DIAGNOSIS — I2510 Atherosclerotic heart disease of native coronary artery without angina pectoris: Secondary | ICD-10-CM | POA: Insufficient documentation

## 2013-02-14 LAB — BASIC METABOLIC PANEL
BUN: 20 mg/dL (ref 6–23)
CO2: 23 mEq/L (ref 19–32)
Calcium: 9.2 mg/dL (ref 8.4–10.5)
Chloride: 109 mEq/L (ref 96–112)
Creatinine, Ser: 1.43 mg/dL — ABNORMAL HIGH (ref 0.50–1.35)
GFR calc Af Amer: 52 mL/min — ABNORMAL LOW (ref >90)
GFR calc non Af Amer: 45 mL/min — ABNORMAL LOW (ref >90)
Glucose, Bld: 120 mg/dL — ABNORMAL HIGH (ref 70–99)
Potassium: 3.9 mEq/L (ref 3.5–5.1)
Sodium: 143 mEq/L (ref 135–145)

## 2013-02-14 MED ORDER — AMLODIPINE BESYLATE 2.5 MG PO TABS: 5.0000 mg | ORAL_TABLET | Freq: Every day | ORAL | Status: DC

## 2013-02-14 MED ORDER — ATORVASTATIN CALCIUM 20 MG PO TABS: 20.0000 mg | ORAL_TABLET | Freq: Every day | ORAL | Status: DC

## 2013-02-14 NOTE — Progress Notes (Signed)
Patient ID: Kevin Schwartz, male   DOB: [birth year], 79 y.o.   MRN: [id number] PCP: Dr.Reid  at [facility]  HPI: Kevin Schwartz is a delightful 79 y/o male with multiple medical problems including DM2, HTN, HL, mild renal insufficiency, RAS s/p stenting of L renal artery in 2008, CAD s/p multiple PCIs on OM-1 (last cath 2007). Previously followed by Dr. Claiborne Billings.   Last Myoview 2010. Ok by his report. Ab u/s in 2/12: showed normal renals and aorta.  Follow up:  Admitted to the hospital 12/25/12 for angioedema and ARB and Crestor stopped. Feeling good. Denies SOB, orthopnea, or CP. Remains very active. Taking medications as prescribed.   ROS: All systems negative except as listed in HPI, PMH and Problem List.  Past Medical History  Diagnosis Date  . Renal insufficiency   . CAD (coronary artery disease)     PTCA of OM in 1998, BMS OM in 2000, Cath 9/07 LM ok LAD ok. LCX 95% in OM prior to previous stent RCA. nondominant normal EF normal. Cypher DES to OM 2007 (PLACED PROXIAMAL TO PREVIOUS STENT)  . DM type 2 (diabetes mellitus, type 2)   . HLD (hyperlipidemia)   . PVD (peripheral vascular disease)   . HTN (hypertension)   . MGUS (monoclonal gammopathy of unknown significance) 08/01/2011    IgG kappa dx 2002 8% plasma cells in bone marrow; no lesions on bone X-rays;  . S/P coronary artery stent placement 08/01/2011  . 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 Peripheral vascular disease, unspecified   . Personality change due to conditions classified elsewhere   . Loss of weight   . Depression   . Urinary tract infection, site not specified   . Memory loss   . Chronic kidney disease, stage III (moderate)   . Chronic kidney disease, unspecified   . Spasm of muscle   . Unspecified disorder of kidney and ureter   . Thyroid disease   . Unspecified hearing loss   . Other atopic dermatitis and related conditions   . Monoclonal paraproteinemia   . Osteoarthrosis, unspecified whether  generalized or localized, unspecified site   . Gout, unspecified   . Coronary atherosclerosis of unspecified type of vessel, native or graft     Current Outpatient Prescriptions  Medication Sig Dispense Refill  . allopurinol (ZYLOPRIM) 100 MG tablet Take 1 tablet (100 mg total) by mouth daily.  30 tablet  6  . amLODipine (NORVASC) 2.5 MG tablet Take 2.5 mg by mouth daily.      [name] aspirin 81 MG tablet Take 81 mg by mouth daily.      . citalopram (CELEXA) 20 MG tablet Take 1 tablet (20 mg total) by mouth daily.  30 tablet  6  . clopidogrel (PLAVIX) 75 MG tablet Take 1 tablet (75 mg total) by mouth daily.  30 tablet  6  . diphenhydrAMINE (BENADRYL) 25 mg capsule Take 1 capsule (25 mg total) by mouth 3 (three) times daily.  15 capsule  0  . famotidine (PEPCID) 20 MG tablet Take 1 tablet (20 mg total) by mouth 2 (two) times daily.  14 tablet  0  . metFORMIN (GLUCOPHAGE) 500 MG tablet Take 1 tablet (500 mg total) by mouth daily with breakfast.  30 tablet  6  . metoprolol (LOPRESSOR) 50 MG tablet Take 1 tablet (50 mg total) by mouth 2 (two) times daily.  60 tablet  6  . nitroGLYCERIN (NITROSTAT) 0.4 MG SL tablet Place  0.4 mg under the tongue every 5 (five) minutes as needed for chest pain. Up to 3 doses       No current facility-administered medications for this encounter.   Filed Vitals:   02/14/13 0934  BP: 148/68  Pulse: 80  Weight: 184 lb (83.462 kg)  SpO2: 100%    PHYSICAL EXAM: General:  Elderly: well appearing. no resp difficulty HEENT: normal Neck: supple. no JVD. Carotids 2+ bilat; no bruits. No lymphadenopathy or thryomegaly appreciated. Cor: PMI nondisplaced. Regular rate & rhythm. No rubs, AS 2/6 murmur.  Lungs: clear Abdomen: soft, nontender, nondistended. No hepatosplenomegaly. No bruits or masses. Good bowel sounds. Extremities: no cyanosis, clubbing, rash, edema Neuro: alert & orientedx3, cranial nerves grossly intact. moves all 4 extremities w/o difficulty. affect  pleasant   EKG: SR with PACs 80 bpm; 1 degree AV block; Septal Q waves V1-V3  ASSESSMENT & PLAN:   1) CAD - no s/s of ischemia. Not currently on statin it was discontinued when he was admitted to the hospital in 12/2012 for angioedema. Discussed with patient that it likely was not his Crestor, however he does not want to restart. Will start Lipitor 20 mg daily and follow up with PCP in 3 months to get checked. - Continue ASA, plavix and BB 2) HLD - 11/2012: Cholesterdol 243, TG 125 and HDL 48 - start lipitor 20 mg daily and follow up 3 months with PCP 3) HTN - He was admitted in 12/2012 for angioedema and Crestor and Losartan discontinued. SBP remains elevated will increase amlodipine to 5 mg daily. F/U 6 months  Junie Bame B NP-C 10:51 AM

## 2013-02-14 NOTE — Patient Instructions (Signed)
Increase amlodipine to 5 mg daily. I have sent a new prescription to your pharmacy for 5 mg tablets.   Start Lipitor 20 mg daily, new prescription has been sent to pharmacy.   Follow up in 6 months.  Call any issues [phone]  Will call with lab results.  Bring all medications to next visit.

## 2013-02-15 NOTE — Addendum Note (Signed)
Encounter addended by: Asencion Gowda, CCT on: 02/15/2013  8:30 AM<BR>     Documentation filed: Charges VN

## 2013-02-18 ENCOUNTER — Ambulatory Visit (INDEPENDENT_AMBULATORY_CARE_PROVIDER_SITE_OTHER): Payer: Medicare Other | Admitting: Podiatrist

## 2013-02-18 ENCOUNTER — Encounter: Payer: Self-pay | Admitting: Podiatrist

## 2013-02-18 VITALS — BP 152/87 | HR 84 | Resp 24 | Ht 68.0 in | Wt 186.0 lb

## 2013-02-18 DIAGNOSIS — M79609 Pain in unspecified limb: Secondary | ICD-10-CM

## 2013-02-18 DIAGNOSIS — B351 Tinea unguium: Secondary | ICD-10-CM

## 2013-02-18 NOTE — Patient Instructions (Signed)
Diabetes and Foot Care Diabetes may cause you to have problems because of poor blood supply (circulation) to your feet and legs. This may cause the skin on your feet to become thinner, break easier, and heal more slowly. Your skin may become dry, and the skin may peel and crack. You may also have nerve damage in your legs and feet causing decreased feeling in them. You may not notice minor injuries to your feet that could lead to infections or more serious problems. Taking care of your feet is one of the most important things you can do for yourself.  HOME CARE INSTRUCTIONS  Wear shoes at all times, even in the house. Do not go barefoot. Bare feet are easily injured.  Check your feet daily for blisters, cuts, and redness. If you cannot see the bottom of your feet, use a mirror or ask someone for help.  Wash your feet with warm water (do not use hot water) and mild soap. Then pat your feet and the areas between your toes until they are completely dry. Do not soak your feet as this can dry your skin.  Apply a moisturizing lotion or petroleum jelly (that does not contain alcohol and is unscented) to the skin on your feet and to dry, brittle toenails. Do not apply lotion between your toes.  Trim your toenails straight across. Do not dig under them or around the cuticle. File the edges of your nails with an emery board or nail file.  Do not cut corns or calluses or try to remove them with medicine.  Wear clean socks or stockings every day. Make sure they are not too tight. Do not wear knee-high stockings since they may decrease blood flow to your legs.  Wear shoes that fit properly and have enough cushioning. To break in new shoes, wear them for just a few hours a day. This prevents you from injuring your feet. Always look in your shoes before you put them on to be sure there are no objects inside.  Do not cross your legs. This may decrease the blood flow to your feet.  If you find a minor scrape,  cut, or break in the skin on your feet, keep it and the skin around it clean and dry. These areas may be cleansed with mild soap and water. Do not cleanse the area with peroxide, alcohol, or iodine.  When you remove an adhesive bandage, be sure not to damage the skin around it.  If you have a wound, look at it several times a day to make sure it is healing.  Do not use heating pads or hot water bottles. They may burn your skin. If you have lost feeling in your feet or legs, you may not know it is happening until it is too late.  Make sure your health care provider performs a complete foot exam at least annually or more often if you have foot problems. Report any cuts, sores, or bruises to your health care provider immediately. SEEK MEDICAL CARE IF:   You have an injury that is not healing.  You have cuts or breaks in the skin.  You have an ingrown nail.  You notice redness on your legs or feet.  You feel burning or tingling in your legs or feet.  You have pain or cramps in your legs and feet.  Your legs or feet are numb.  Your feet always feel cold. SEEK IMMEDIATE MEDICAL CARE IF:   There is increasing redness,   swelling, or pain in or around a wound.  There is a red line that goes up your leg.  Pus is coming from a wound.  You develop a fever or as directed by your health care provider.  You notice a bad smell coming from an ulcer or wound. Document Released: 03/28/2000 Document Revised: 12/01/2012 Document Reviewed: 09/07/2012 [employer] Patient Information 2014 [employer].  

## 2013-02-18 NOTE — Progress Notes (Signed)
  HPI:  Patient presents today for follow up of foot and nail care. Denies any new complaints today.  Objective:  Patients chart is reviewed.  Neurovascular status unchanged with palpable pedal pulses present.  Patients nails are thickened, discolored, distrophic, friable and brittle with yellow-brown discoloration. Patient subjectively relates they are painful with shoes and with ambulation.both feet digits 1-5 rignt, 2-5 left. Contracture deformity all digits noted   Assessment:  Symptomatic onychomycosis  Plan:  Discussed treatment options and alternatives.  The symptomatic toenails were debrided through manual an mechanical means without complication.  Return appointment recommended at routine intervals of 3 months

## 2013-02-21 ENCOUNTER — Encounter: Payer: Self-pay | Admitting: Internal Medicine

## 2013-02-21 ENCOUNTER — Ambulatory Visit (INDEPENDENT_AMBULATORY_CARE_PROVIDER_SITE_OTHER): Payer: Medicare Other | Admitting: Internal Medicine

## 2013-02-21 VITALS — BP 146/78 | HR 81 | Temp 97.8°F | Wt 185.0 lb

## 2013-02-21 DIAGNOSIS — M109 Gout, unspecified: Secondary | ICD-10-CM

## 2013-02-21 DIAGNOSIS — I2510 Atherosclerotic heart disease of native coronary artery without angina pectoris: Secondary | ICD-10-CM

## 2013-02-21 DIAGNOSIS — T788XXS Other adverse effects, not elsewhere classified, sequela: Secondary | ICD-10-CM

## 2013-02-21 DIAGNOSIS — D649 Anemia, unspecified: Secondary | ICD-10-CM

## 2013-02-21 DIAGNOSIS — D472 Monoclonal gammopathy: Secondary | ICD-10-CM

## 2013-02-21 DIAGNOSIS — T783XXS Angioneurotic edema, sequela: Secondary | ICD-10-CM

## 2013-02-21 DIAGNOSIS — E1129 Type 2 diabetes mellitus with other diabetic kidney complication: Secondary | ICD-10-CM

## 2013-02-21 DIAGNOSIS — E785 Hyperlipidemia, unspecified: Secondary | ICD-10-CM

## 2013-02-21 DIAGNOSIS — Z23 Encounter for immunization: Secondary | ICD-10-CM

## 2013-02-21 DIAGNOSIS — T7589XS Other specified effects of external causes, sequela: Secondary | ICD-10-CM

## 2013-02-21 DIAGNOSIS — I10 Essential (primary) hypertension: Secondary | ICD-10-CM

## 2013-02-21 MED ORDER — TETANUS-DIPHTH-ACELL PERTUSSIS 5-2.5-18.5 LF-MCG/0.5 IM SUSP: 0.5000 mL | Freq: Once | INTRAMUSCULAR | Status: DC

## 2013-02-21 NOTE — Progress Notes (Signed)
Patient ID: Kevin Schwartz, male   DOB: 1930-05-10, 79 y.o.   MRN: [id number] Location:  [facility] / [facility]  Code Status: full code  Allergies  Allergen Reactions  . Losartan Potassium Swelling    Angioedema   . Crestor [Rosuvastatin] Swelling    Chief Complaint  Patient presents with  . Medical Managment of Chronic Issues    3 month follow-up   . Medication Management    Discuss Crestor- not currently taking, was hospitalized due to medication     HPI: Patient is a 79 y.o.  seen in the office today for medical management of chronic issues.  Pt went to the ED in September d/t facial swelling. He was taken off of the losartan and Crestor by the physician at the hospital. His cardiologist switched him to Lipitor since he had the reaction while on Crestor.  Pt states he is supposed to be checking his BP at home but he does not have a machine. He continues to take Norvasc and Lopressor. Pt has no  other complaints.   Review of Systems:  Review of Systems  Constitutional: Negative for fever, chills and weight loss.  HENT: Negative for hearing loss.   Eyes: Negative for blurred vision and double vision.  Respiratory: Negative for shortness of breath.   Cardiovascular: Negative for chest pain.  Gastrointestinal: Negative for diarrhea and constipation.  Genitourinary: Negative for dysuria, urgency and frequency.  Musculoskeletal: Negative for falls.  Psychiatric/Behavioral: Negative for depression. The patient is not nervous/anxious.        At home by himself states that he gets lonely but he tries to stay busy.     Past Medical History  Diagnosis Date  . Renal insufficiency   . CAD (coronary artery disease)     PTCA of OM in 1998, BMS OM in 2000, Cath 9/07 LM ok LAD ok. LCX 95% in OM prior to previous stent RCA. nondominant normal EF normal. Cypher DES to OM 2007 (PLACED PROXIAMAL TO PREVIOUS STENT)  . DM type 2 (diabetes mellitus, type 2)    . HLD (hyperlipidemia)   . PVD (peripheral vascular disease)   . HTN (hypertension)   . MGUS (monoclonal gammopathy of unknown significance) 08/01/2011    IgG kappa dx 2002 8% plasma cells in bone marrow; no lesions on bone X-rays;  . S/P coronary artery stent placement 08/01/2011  . 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 Peripheral vascular disease, unspecified   . Personality change due to conditions classified elsewhere   . Loss of weight   . Depression   . Urinary tract infection, site not specified   . Memory loss   . Chronic kidney disease, stage III (moderate)   . Chronic kidney disease, unspecified   . Spasm of muscle   . Unspecified disorder of kidney and ureter   . Thyroid disease   . Unspecified hearing loss   . Other atopic dermatitis and related conditions   . Monoclonal paraproteinemia   . Osteoarthrosis, unspecified whether generalized or localized, unspecified site   . Gout, unspecified   . Coronary atherosclerosis of unspecified type of vessel, native or graft     Past Surgical History  Procedure Laterality Date  . Carotid stent    . Coronary angioplasty    . Knee arthroplasty      total  . Joint replacement      left knee    Social History:   reports that  he quit smoking about 10 years ago. He has never used smokeless tobacco. He reports that he does not drink alcohol or use illicit drugs.  Family History  Problem Relation Age of Onset  . Diabetes    . Coronary artery disease    . Cancer    . Heart disease Mother   . Heart disease Sister   . Heart disease Sister   . Cancer Sister   . Hypertension Sister   . Diabetes Brother   . Diabetes Brother     Medications: Patient's Medications  New Prescriptions   No medications on file  Previous Medications   ALLOPURINOL (ZYLOPRIM) 100 MG TABLET    Take 1 tablet (100 mg total) by mouth daily.   AMLODIPINE (NORVASC) 2.5 MG TABLET    Take 2 tablets (5 mg total) by mouth daily.   ASPIRIN 81 MG  TABLET    Take 81 mg by mouth daily.   ATORVASTATIN (LIPITOR) 20 MG TABLET    Take 1 tablet (20 mg total) by mouth daily.   CITALOPRAM (CELEXA) 20 MG TABLET    Take 1 tablet (20 mg total) by mouth daily.   CLOPIDOGREL (PLAVIX) 75 MG TABLET    Take 1 tablet (75 mg total) by mouth daily.   DIPHENHYDRAMINE (BENADRYL) 25 MG CAPSULE    Take 1 capsule (25 mg total) by mouth 3 (three) times daily.   FAMOTIDINE (PEPCID) 20 MG TABLET    Take 1 tablet (20 mg total) by mouth 2 (two) times daily.   METFORMIN (GLUCOPHAGE) 500 MG TABLET    Take 1 tablet (500 mg total) by mouth daily with breakfast.   METOPROLOL (LOPRESSOR) 50 MG TABLET    Take 1 tablet (50 mg total) by mouth 2 (two) times daily.   NITROGLYCERIN (NITROSTAT) 0.4 MG SL TABLET    Place 0.4 mg under the tongue every 5 (five) minutes as needed for chest pain. Up to 3 doses  Modified Medications   No medications on file  Discontinued Medications   No medications on file     Physical Exam: Filed Vitals:   02/21/13 1143  BP: 146/78  Pulse: 81  Temp: 97.8 F (36.6 C)  TempSrc: Oral  Weight: 185 lb (83.915 kg)  SpO2: 99%   Physical Exam  Constitutional: He is oriented to person, place, and time. He appears well-developed and well-nourished.  Cardiovascular: Normal rate, regular rhythm, normal heart sounds and intact distal pulses.   Pulmonary/Chest: Effort normal and breath sounds normal.  Abdominal: Soft. Bowel sounds are normal.  Neurological: He is alert and oriented to person, place, and time.  Skin: Skin is warm and dry.  Psychiatric: He has a normal mood and affect.     Labs reviewed: Basic Metabolic Panel:  Recent Labs  08/25/12 1140  12/26/12 0515 12/27/12 0800 02/14/13 1001  NA 140  < > 137 139 143  K 4.6  < > 4.5 4.5 3.9  CL 103  < > 106 108 109  CO2 24  < > 19 21 23   GLUCOSE 96  < > 202* 110* 120*  BUN 18  < > 26* 24* 20  CREATININE 1.56*  < > 1.45* 1.29 1.43*  CALCIUM 9.4  < > 8.4 8.7 9.2  TSH 3.240  --   --    --   --   < > = values in this interval not displayed. Liver Function Tests:  Recent Labs  07/23/12 1031 11/25/12 0919 12/26/12 0515  AST  27 18 17   ALT 26 17 11   ALKPHOS 77 78 65  BILITOT 0.30 0.3 0.1*  PROT 7.5 7.3 6.9  ALBUMIN 3.5  --  3.2*   CBC:  Recent Labs  11/25/12 0919 12/25/12 2046 12/26/12 0515 12/27/12 0800  WBC 6.0 6.1 5.7 23.2*  NEUTROABS 4.2 3.9  --   --   HGB 13.1 11.6* 12.0* 12.4*  HCT 39.6 34.3* 36.5* 37.5*  MCV 93 91.7 92.6 91.5  PLT  --  320 328 379   Lipid Panel:  Recent Labs  08/25/12 1140 11/25/12 0919  HDL 50 48  LDLCALC 114* 170*  TRIG 118 125  CHOLHDL 3.8 5.1*   Lab Results  Component Value Date   HGBA1C 6.1* 11/25/2012   Assessment/Plan 1. Angioedema of lips, sequela Was seen in ED for this Due to losartan, but pt adamantly against retrying crestor after this episode due to just having started it when the episode occured  2. Type II or unspecified type diabetes mellitus with renal manifestations, not stated as uncontrolled(250.40) hba1c was at goal in august, check f/u level today  3. Normochromic normocytic anemia -blood counts improved  4. Gout, unspecified -no recent flares Cont allopurinol indefinitely  5. Essential hypertension, benign -bp at goal with current therapy for age -continues low sodium/NAS diet  6. MGUS (monoclonal gammopathy of unknown significance) -has had for many years w/o progression to myeloma--continue to monitor -appears he has labs ordered through oncology for this  7. Coronary atherosclerosis of native coronary artery -stable, asymptomatic, continue secondary preventative measures with bp and lipid control, healthy diet and exercise  8. Hyperlipidemia LDL goal < 100 -has been put back on lipitor in place of crestor though both cardiologist and myself feel it was likely his ARB that caused the angioedema -too soon to f/u lipids since med just changed--check before next visit  Labs/tests  ordered:  hba1c today, rest up to date Next appt:  4 mos

## 2013-02-22 LAB — HEMOGLOBIN A1C
Est. average glucose Bld gHb Est-mCnc: 123 mg/dL
Hgb A1c MFr Bld: 5.9 % — ABNORMAL HIGH (ref 4.8–5.6)

## 2013-04-14 IMAGING — CR DG HAND COMPLETE 3+V*L*
3 series · 3 of 3 positions shown · non-contrast
Comparison: None.

CLINICAL DATA: Suspected insect bite on [REDACTED] night.  Left hand is
swollen.  Redness, pain.

LEFT HAND - COMPLETE 3+ VIEW

[x hand pa left]
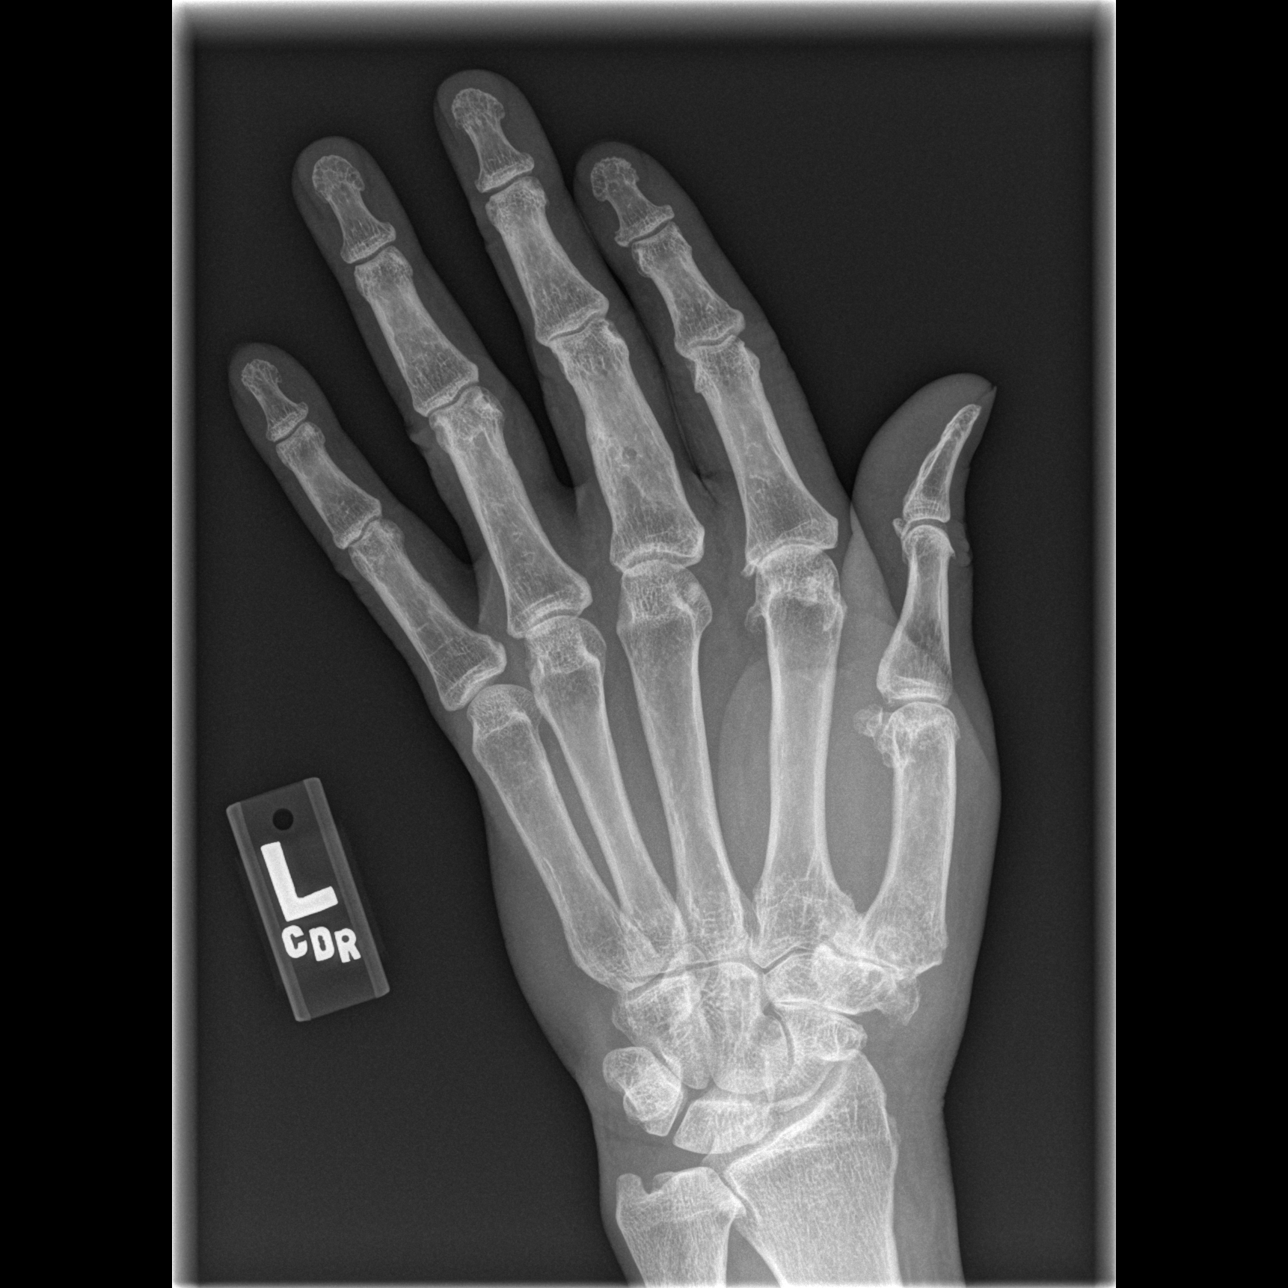

[x hand oblique left]
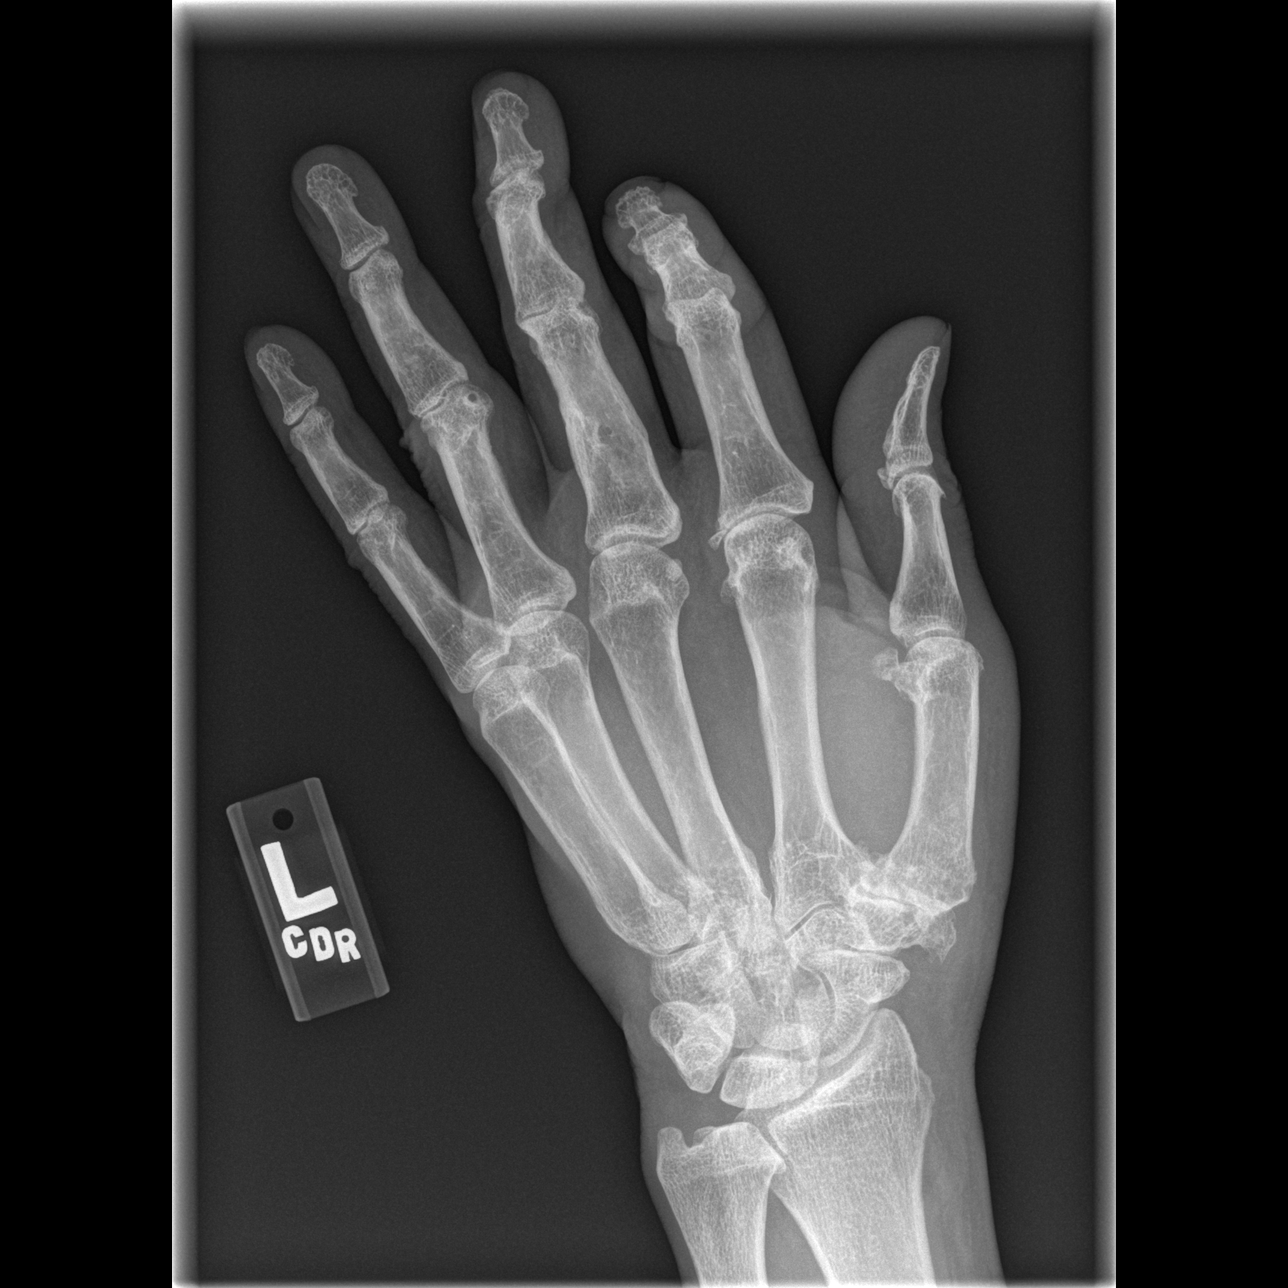

[x hand lat left]
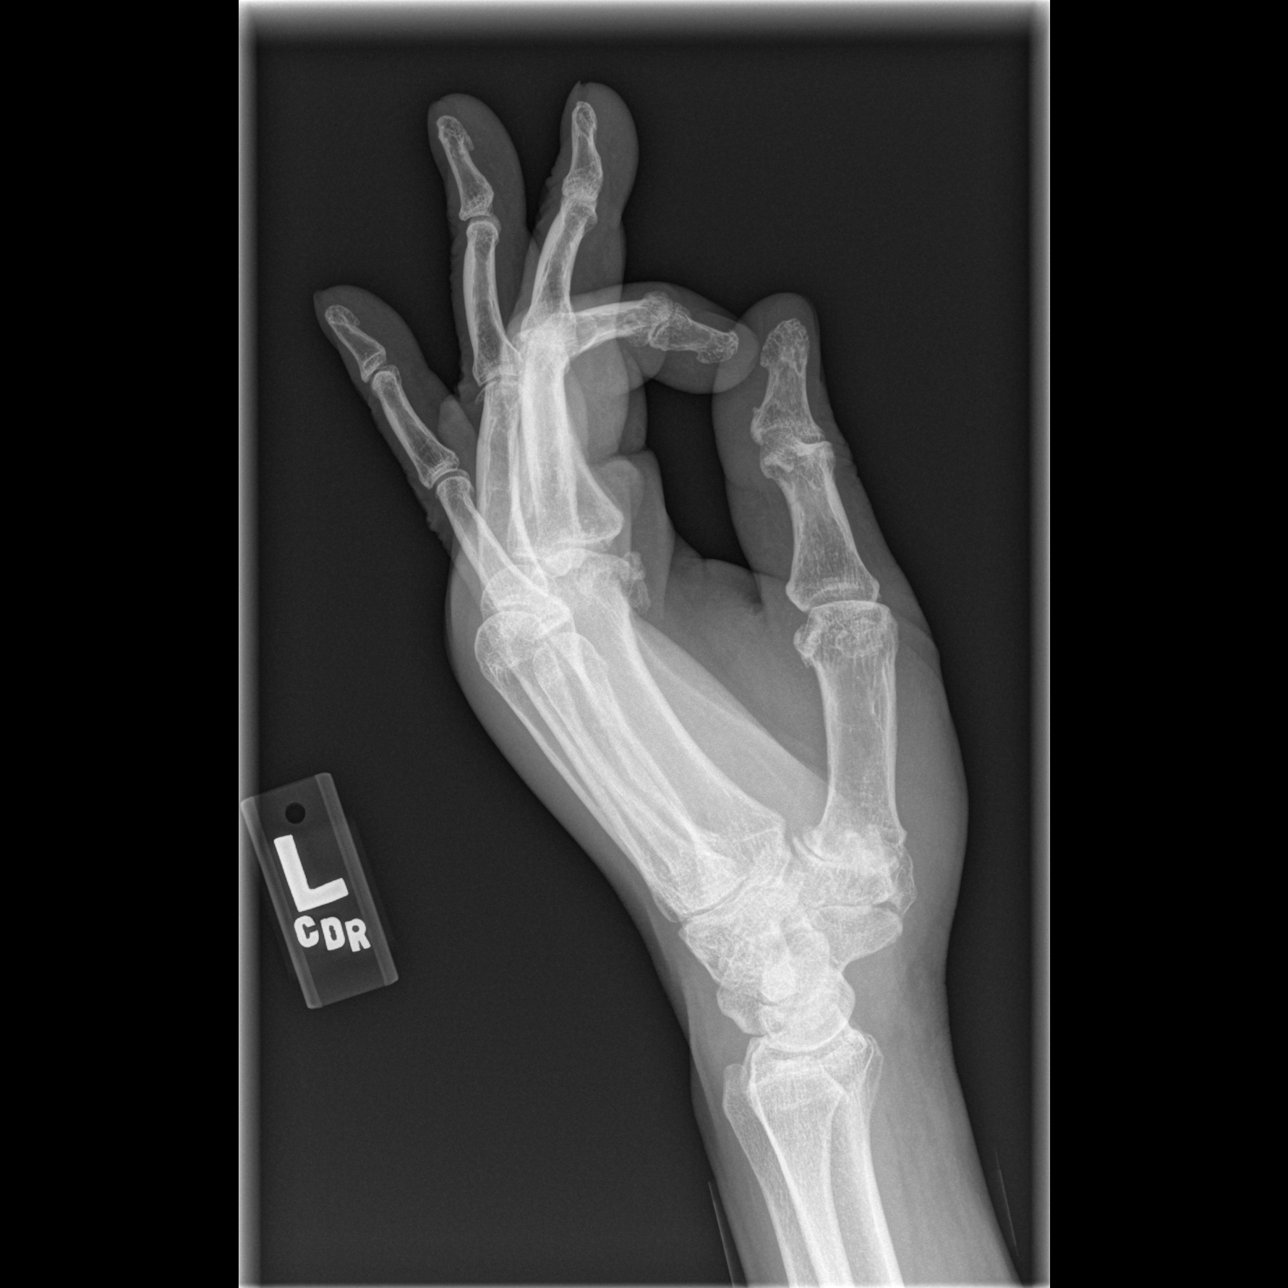

[3 of 3 positions shown; findings below may reference images not displayed]

FINDINGS: There is diffuse soft tissue swelling of the hand.
Degenerative changes are identified in the distal interphalangeal
joints, metacarpal phalangeal joints.  There is moderate
degenerative change in the first carpometacarpal joint.  No
evidence for acute fracture or dislocation.  No radiopaque foreign
bodies or soft tissue gas identified.
IMPRESSION: 1.  Soft tissue edema.
2.  Degenerative changes.

## 2013-04-17 ENCOUNTER — Other Ambulatory Visit: Payer: Self-pay | Admitting: Internal Medicine

## 2013-05-16 ENCOUNTER — Ambulatory Visit (INDEPENDENT_AMBULATORY_CARE_PROVIDER_SITE_OTHER): Payer: Medicare Other | Admitting: Internal Medicine

## 2013-05-16 ENCOUNTER — Encounter: Payer: Self-pay | Admitting: Internal Medicine

## 2013-05-16 VITALS — BP 136/86 | HR 63 | Resp 10 | Wt 186.0 lb

## 2013-05-16 DIAGNOSIS — G4700 Insomnia, unspecified: Secondary | ICD-10-CM

## 2013-05-16 DIAGNOSIS — F4321 Adjustment disorder with depressed mood: Secondary | ICD-10-CM

## 2013-05-16 NOTE — Progress Notes (Signed)
Patient ID: Kevin Schwartz, male   DOB: 11-08-1930, 80 y.o.   MRN: [id number]   Location:  [facility]   Allergies  Allergen Reactions  . Losartan Potassium Swelling    Angioedema   . Crestor [Rosuvastatin] Swelling    Chief Complaint  Patient presents with  . Sleeping Problem    Patient c/o trouble falling and staying asleep x 1 year or longer     HPI: Patient is a 80 y.o. black male seen in the office today for acute visit due to sleeping problem.  Not really thinking about things.  Sleeps well 2-3 nights per week, rest cannot fall asleep.  Goes to bed as early as 7:30-8pm.  Puts TV on and watches it, gets bored, then switches to radio.  If falls asleep, wakes again at 3-4am--eats breakfast, coffee and goes back to bed till 8:30-9.  No pain to keep him up.  Mood is good.  Eating well.  Cooking for himself.  [facility]  Wants to be outside, but too cold.  Is not a couch potato.  Gets up 2x to urinate at night, but that's not waking him--isn't asleep to begin with.  Doesn't take daytime naps.  Not sleepy in the daytime.    Review of Systems:  Review of Systems  Constitutional: Positive for malaise/fatigue.  HENT: Negative for congestion.   Eyes: Negative for blurred vision.  Respiratory: Negative for shortness of breath.   Cardiovascular: Negative for chest pain and leg swelling.  Gastrointestinal: Negative for abdominal pain, constipation, blood in stool and melena.  Genitourinary: Positive for frequency. Negative for dysuria and urgency.  Musculoskeletal: Negative for falls and myalgias.  Neurological: Negative for dizziness, loss of consciousness and weakness.  Psychiatric/Behavioral: Negative for depression and memory loss. The patient has insomnia.      Past Medical History  Diagnosis Date  . Renal insufficiency   . CAD (coronary artery disease)     PTCA of OM in 1998, BMS OM in 2000, Cath 9/07 LM ok LAD ok. LCX 95%  in OM prior to previous stent RCA. nondominant normal EF normal. Cypher DES to OM 2007 (PLACED PROXIAMAL TO PREVIOUS STENT)  . DM type 2 (diabetes mellitus, type 2)   . HLD (hyperlipidemia)   . PVD (peripheral vascular disease)   . HTN (hypertension)   . MGUS (monoclonal gammopathy of unknown significance) 08/01/2011    IgG kappa dx 2002 8% plasma cells in bone marrow; no lesions on bone X-rays;  . S/P coronary artery stent placement 08/01/2011  . 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 Peripheral vascular disease, unspecified   . Personality change due to conditions classified elsewhere   . Loss of weight   . Depression   . Urinary tract infection, site not specified   . Memory loss   . Chronic kidney disease, stage III (moderate)   . Chronic kidney disease, unspecified   . Spasm of muscle   . Unspecified disorder of kidney and ureter   . Thyroid disease   . Unspecified hearing loss   . Other atopic dermatitis and related conditions   . Monoclonal paraproteinemia   . Osteoarthrosis, unspecified whether generalized or localized, unspecified site   . Gout, unspecified   . Coronary atherosclerosis of unspecified type of vessel, native or graft     Past Surgical History  Procedure Laterality Date  . Carotid stent    . Coronary  angioplasty    . Knee arthroplasty      total  . Joint replacement      left knee    Social History:   reports that he quit smoking about 10 years ago. He has never used smokeless tobacco. He reports that he does not drink alcohol or use illicit drugs.  Family History  Problem Relation Age of Onset  . Diabetes    . Coronary artery disease    . Cancer    . Heart disease Mother   . Heart disease Sister   . Heart disease Sister   . Cancer Sister   . Hypertension Sister   . Diabetes Brother   . Diabetes Brother     Medications: Patient's Medications  New Prescriptions   No medications on file  Previous Medications   ALLOPURINOL (ZYLOPRIM)  100 MG TABLET    Take 1 tablet (100 mg total) by mouth daily.   ASPIRIN 81 MG TABLET    Take 81 mg by mouth daily.   ATORVASTATIN (LIPITOR) 20 MG TABLET    Take 1 tablet (20 mg total) by mouth daily.   CITALOPRAM (CELEXA) 20 MG TABLET    Take 1 tablet (20 mg total) by mouth daily.   CLOPIDOGREL (PLAVIX) 75 MG TABLET    Take 1 tablet (75 mg total) by mouth daily.   LISINOPRIL (PRINIVIL,ZESTRIL) 10 MG TABLET    Take 10 mg by mouth 2 (two) times daily.   LOSARTAN (COZAAR) 25 MG TABLET    Take 25 mg by mouth daily.   METFORMIN (GLUCOPHAGE) 500 MG TABLET    Take 1 tablet (500 mg total) by mouth daily with breakfast.   METOPROLOL (LOPRESSOR) 50 MG TABLET    Take 1 tablet (50 mg total) by mouth 2 (two) times daily.   NITROGLYCERIN (NITROSTAT) 0.4 MG SL TABLET    Place 0.4 mg under the tongue every 5 (five) minutes as needed for chest pain. Up to 3 doses   TDAP (BOOSTRIX) 5-2.5-18.5 LF-MCG/0.5 INJECTION    Inject 0.5 mLs into the muscle once.  Modified Medications   Modified Medication Previous Medication   AMLODIPINE (NORVASC) 2.5 MG TABLET amLODipine (NORVASC) 2.5 MG tablet      Take 2.5 mg by mouth daily.    Take 2 tablets (5 mg total) by mouth daily.  Discontinued Medications   DIPHENHYDRAMINE (BENADRYL) 25 MG CAPSULE    Take 1 capsule (25 mg total) by mouth 3 (three) times daily.   FAMOTIDINE (PEPCID) 20 MG TABLET    Take 1 tablet (20 mg total) by mouth 2 (two) times daily.     Physical Exam: Filed Vitals:   05/16/13 1454  BP: 136/86  Pulse: 63  Resp: 10  Weight: 186 lb (84.369 kg)  SpO2: 99%  Physical Exam  Constitutional: He is oriented to person, place, and time.  Cardiovascular: Normal rate, regular rhythm, normal heart sounds and intact distal pulses.   Pulmonary/Chest: Effort normal and breath sounds normal. No respiratory distress.  Abdominal: Soft. Bowel sounds are normal. He exhibits no distension and no mass. There is no tenderness.  Musculoskeletal: Normal range of motion.  He exhibits no edema and no tenderness.  Neurological: He is alert and oriented to person, place, and time.  Skin: Skin is warm and dry.  Psychiatric: He has a normal mood and affect. His behavior is normal. Judgment and thought content normal.     Labs reviewed: Basic Metabolic Panel:  Recent Labs  08/25/12 1140  12/26/12 0515 12/27/12 0800 02/14/13 1001  NA 140  < > 137 139 143  K 4.6  < > 4.5 4.5 3.9  CL 103  < > 106 108 109  CO2 24  < > 19 21 23   GLUCOSE 96  < > 202* 110* 120*  BUN 18  < > 26* 24* 20  CREATININE 1.56*  < > 1.45* 1.29 1.43*  CALCIUM 9.4  < > 8.4 8.7 9.2  TSH 3.240  --   --   --   --   < > = values in this interval not displayed. Liver Function Tests:  Recent Labs  07/23/12 1031 11/25/12 0919 12/26/12 0515  AST 27 18 17   ALT 26 17 11   ALKPHOS 77 78 65  BILITOT 0.30 0.3 0.1*  PROT 7.5 7.3 6.9  ALBUMIN 3.5  --  3.2*   No results found for this basename: LIPASE, AMYLASE,  in the last 8760 hours No results found for this basename: AMMONIA,  in the last 8760 hours CBC:  Recent Labs  11/25/12 0919 12/25/12 2046 12/26/12 0515 12/27/12 0800  WBC 6.0 6.1 5.7 23.2*  NEUTROABS 4.2 3.9  --   --   HGB 13.1 11.6* 12.0* 12.4*  HCT 39.6 34.3* 36.5* 37.5*  MCV 93 91.7 92.6 91.5  PLT  --  320 328 379   Lipid Panel:  Recent Labs  08/25/12 1140 11/25/12 0919  HDL 50 48  LDLCALC 114* 170*  TRIG 118 125  CHOLHDL 3.8 5.1*   Lab Results  Component Value Date   HGBA1C 5.9* 02/21/2013   Assessment/Plan 1. Insomnia -suspect this is primarily due to loss of his wife;  I reviewed good sleep hygiene practices with him and discussed potential side effects of most sedative/hypnotic meds -discussed that he should not use benadryl due increasing daytime sedation, fall risk  2. Grief reaction -appropriate due to wife's death, cont to monitor him  Labs/tests ordered:  No orders of the defined types were placed in this encounter.    Next appt:  2  mos

## 2013-05-27 ENCOUNTER — Ambulatory Visit: Payer: Medicare Other | Admitting: Podiatrist

## 2013-06-11 ENCOUNTER — Encounter: Payer: Self-pay | Admitting: *Deleted

## 2013-06-11 ENCOUNTER — Telehealth: Payer: Self-pay | Admitting: *Deleted

## 2013-06-11 NOTE — Telephone Encounter (Signed)
Former pt of DR. G...td  

## 2013-06-15 ENCOUNTER — Encounter (HOSPITAL_COMMUNITY): Payer: Self-pay | Admitting: Emergency Medicine

## 2013-06-15 ENCOUNTER — Emergency Department (HOSPITAL_COMMUNITY)
Admission: EM | Admit: 2013-06-15 | Discharge: 2013-06-15 | Disposition: A | Discharge status: Home / Self Care | Payer: Medicare Other | Attending: Emergency Medicine | Admitting: Emergency Medicine

## 2013-06-15 ENCOUNTER — Emergency Department (HOSPITAL_COMMUNITY): Payer: Medicare Other

## 2013-06-15 DIAGNOSIS — Z79899 Other long term (current) drug therapy: Secondary | ICD-10-CM | POA: Insufficient documentation

## 2013-06-15 DIAGNOSIS — E1129 Type 2 diabetes mellitus with other diabetic kidney complication: Secondary | ICD-10-CM | POA: Insufficient documentation

## 2013-06-15 DIAGNOSIS — Z87440 Personal history of urinary (tract) infections: Secondary | ICD-10-CM | POA: Insufficient documentation

## 2013-06-15 DIAGNOSIS — T783XXA Angioneurotic edema, initial encounter: Secondary | ICD-10-CM | POA: Insufficient documentation

## 2013-06-15 DIAGNOSIS — I129 Hypertensive chronic kidney disease with stage 1 through stage 4 chronic kidney disease, or unspecified chronic kidney disease: Secondary | ICD-10-CM | POA: Insufficient documentation

## 2013-06-15 DIAGNOSIS — Z7982 Long term (current) use of aspirin: Secondary | ICD-10-CM | POA: Insufficient documentation

## 2013-06-15 DIAGNOSIS — M1990 Unspecified osteoarthritis, unspecified site: Secondary | ICD-10-CM | POA: Insufficient documentation

## 2013-06-15 DIAGNOSIS — N183 Chronic kidney disease, stage 3 unspecified: Secondary | ICD-10-CM | POA: Insufficient documentation

## 2013-06-15 DIAGNOSIS — Z7902 Long term (current) use of antithrombotics/antiplatelets: Secondary | ICD-10-CM | POA: Insufficient documentation

## 2013-06-15 DIAGNOSIS — L0201 Cutaneous abscess of face: Secondary | ICD-10-CM | POA: Insufficient documentation

## 2013-06-15 DIAGNOSIS — I10 Essential (primary) hypertension: Secondary | ICD-10-CM | POA: Insufficient documentation

## 2013-06-15 DIAGNOSIS — E785 Hyperlipidemia, unspecified: Secondary | ICD-10-CM | POA: Insufficient documentation

## 2013-06-15 DIAGNOSIS — L03211 Cellulitis of face: Secondary | ICD-10-CM | POA: Insufficient documentation

## 2013-06-15 DIAGNOSIS — Z9861 Coronary angioplasty status: Secondary | ICD-10-CM | POA: Insufficient documentation

## 2013-06-15 DIAGNOSIS — M109 Gout, unspecified: Secondary | ICD-10-CM | POA: Insufficient documentation

## 2013-06-15 DIAGNOSIS — Z87891 Personal history of nicotine dependence: Secondary | ICD-10-CM | POA: Insufficient documentation

## 2013-06-15 DIAGNOSIS — H9190 Unspecified hearing loss, unspecified ear: Secondary | ICD-10-CM | POA: Insufficient documentation

## 2013-06-15 DIAGNOSIS — F329 Major depressive disorder, single episode, unspecified: Secondary | ICD-10-CM | POA: Insufficient documentation

## 2013-06-15 DIAGNOSIS — Z872 Personal history of diseases of the skin and subcutaneous tissue: Secondary | ICD-10-CM | POA: Insufficient documentation

## 2013-06-15 DIAGNOSIS — I2510 Atherosclerotic heart disease of native coronary artery without angina pectoris: Secondary | ICD-10-CM | POA: Insufficient documentation

## 2013-06-15 DIAGNOSIS — F3289 Other specified depressive episodes: Secondary | ICD-10-CM | POA: Insufficient documentation

## 2013-06-15 LAB — CBC WITH DIFFERENTIAL/PLATELET
Basophils Absolute: 0 10*3/uL (ref 0.0–0.1)
Basophils Relative: 0 % (ref 0–1)
Eosinophils Absolute: 0.4 10*3/uL (ref 0.0–0.7)
Eosinophils Relative: 6 % — ABNORMAL HIGH (ref 0–5)
HCT: 36.4 % — ABNORMAL LOW (ref 39.0–52.0)
Hemoglobin: 12.7 g/dL — ABNORMAL LOW (ref 13.0–17.0)
Lymphocytes Relative: 18 % (ref 12–46)
Lymphs Abs: 1.2 10*3/uL (ref 0.7–4.0)
MCH: 32.2 pg (ref 26.0–34.0)
MCHC: 34.9 g/dL (ref 30.0–36.0)
MCV: 92.2 fL (ref 78.0–100.0)
Monocytes Absolute: 0.6 10*3/uL (ref 0.1–1.0)
Monocytes Relative: 10 % (ref 3–12)
Neutro Abs: 4.4 10*3/uL (ref 1.7–7.7)
Neutrophils Relative %: 66 % (ref 43–77)
Platelets: 326 10*3/uL (ref 150–400)
RBC: 3.95 MIL/uL — ABNORMAL LOW (ref 4.22–5.81)
RDW: 13.5 % (ref 11.5–15.5)
WBC: 6.7 10*3/uL (ref 4.0–10.5)

## 2013-06-15 LAB — BASIC METABOLIC PANEL
BUN: 31 mg/dL — ABNORMAL HIGH (ref 6–23)
CO2: 22 mEq/L (ref 19–32)
Calcium: 9.5 mg/dL (ref 8.4–10.5)
Chloride: 106 mEq/L (ref 96–112)
Creatinine, Ser: 1.79 mg/dL — ABNORMAL HIGH (ref 0.50–1.35)
GFR calc Af Amer: 40 mL/min — ABNORMAL LOW (ref >90)
GFR calc non Af Amer: 34 mL/min — ABNORMAL LOW (ref >90)
Glucose, Bld: 87 mg/dL (ref 70–99)
Potassium: 4.6 mEq/L (ref 3.7–5.3)
Sodium: 143 mEq/L (ref 137–147)

## 2013-06-15 MED ORDER — CLINDAMYCIN HCL 150 MG PO CAPS: 150.0000 mg | ORAL_CAPSULE | Freq: Four times a day (QID) | ORAL | Status: DC

## 2013-06-15 MED ORDER — IOHEXOL 300 MG/ML  SOLN
50.0000 mL | Freq: Once | INTRAMUSCULAR | Status: AC | PRN
  Administered 2013-06-15: 50 mL via INTRAVENOUS

## 2013-06-15 NOTE — ED Notes (Signed)
Patient woke up this morning with the left side of his face swelling.  Has been taking Lisinopril for some time

## 2013-06-15 NOTE — ED Notes (Signed)
Dr Allen at bedside  

## 2013-06-15 NOTE — ED Notes (Signed)
Pt was admitted for same in September 2014-- pt in no resp distress.

## 2013-06-15 NOTE — ED Provider Notes (Signed)
CSN: [id number]     Arrival date & time 06/15/13  [id number] History   First MD Initiated Contact with Patient 06/15/13 [phone]     Chief Complaint  Patient presents with  . Facial Swelling     (Consider location/radiation/quality/duration/timing/severity/associated sxs/prior Treatment) The history is provided by the patient.   patient here complaining of acute onset of left-sided facial swelling localized to the parotid gland. Denies any trouble swallowing. Denies any rashes or pruritus. He denies any pain to the area. No fever or chills. History of similar symptoms in the past although he is unsure of what it is from. Denies any teeth pain. No sore throat noted. No recent history of trauma. Symptoms have been persistent and nothing makes them better worse. No treatment used prior to arrival.  Past Medical History  Diagnosis Date  . Renal insufficiency   . CAD (coronary artery disease)     PTCA of OM in 1998, BMS OM in 2000, Cath 9/07 LM ok LAD ok. LCX 95% in OM prior to previous stent RCA. nondominant normal EF normal. Cypher DES to OM 2007 (PLACED PROXIAMAL TO PREVIOUS STENT)  . DM type 2 (diabetes mellitus, type 2)   . HLD (hyperlipidemia)   . PVD (peripheral vascular disease)   . HTN (hypertension)   . MGUS (monoclonal gammopathy of unknown significance) 08/01/2011    IgG kappa dx 2002 8% plasma cells in bone marrow; no lesions on bone X-rays;  . S/P coronary artery stent placement 08/01/2011  . 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 Peripheral vascular disease, unspecified   . Personality change due to conditions classified elsewhere   . Loss of weight   . Depression   . Urinary tract infection, site not specified   . Memory loss   . Chronic kidney disease, stage III (moderate)   . Chronic kidney disease, unspecified   . Spasm of muscle   . Unspecified disorder of kidney and ureter   . Thyroid disease   . Unspecified hearing loss   . Other atopic dermatitis and related conditions    . Monoclonal paraproteinemia   . Osteoarthrosis, unspecified whether generalized or localized, unspecified site   . Gout, unspecified   . Coronary atherosclerosis of unspecified type of vessel, native or graft    Past Surgical History  Procedure Laterality Date  . Carotid stent    . Coronary angioplasty    . Knee arthroplasty      total  . Joint replacement      left knee   Family History  Problem Relation Age of Onset  . Diabetes    . Coronary artery disease    . Cancer    . Heart disease Mother   . Heart disease Sister   . Heart disease Sister   . Cancer Sister   . Hypertension Sister   . Diabetes Brother   . Diabetes Brother    History  Substance Use Topics  . Smoking status: Former Smoker    Quit date: 10/15/2002  . Smokeless tobacco: Never Used  . Alcohol Use: No    Review of Systems  All other systems reviewed and are negative.      Allergies  Losartan potassium and Crestor  Home Medications   Current Outpatient Rx  Name  Route  Sig  Dispense  Refill  . allopurinol (ZYLOPRIM) 100 MG tablet   Oral   Take 1 tablet (100 mg total) by mouth daily.   30 tablet  6   . amLODipine (NORVASC) 2.5 MG tablet   Oral   Take 2.5 mg by mouth daily.         [name] aspirin 81 MG tablet   Oral   Take 81 mg by mouth daily.         [name] atorvastatin (LIPITOR) 20 MG tablet   Oral   Take 1 tablet (20 mg total) by mouth daily.   30 tablet   6   . citalopram (CELEXA) 20 MG tablet   Oral   Take 1 tablet (20 mg total) by mouth daily.   30 tablet   6   . clopidogrel (PLAVIX) 75 MG tablet   Oral   Take 1 tablet (75 mg total) by mouth daily.   30 tablet   6   . lisinopril (PRINIVIL,ZESTRIL) 10 MG tablet   Oral   Take 10 mg by mouth 2 (two) times daily.         [name] losartan (COZAAR) 25 MG tablet   Oral   Take 25 mg by mouth daily.         . metFORMIN (GLUCOPHAGE) 500 MG tablet   Oral   Take 1 tablet (500 mg total) by mouth daily with breakfast.   30  tablet   6   . metoprolol (LOPRESSOR) 50 MG tablet   Oral   Take 1 tablet (50 mg total) by mouth 2 (two) times daily.   60 tablet   6   . nitroGLYCERIN (NITROSTAT) 0.4 MG SL tablet   Sublingual   Place 0.4 mg under the tongue every 5 (five) minutes as needed for chest pain. Up to 3 doses         . TDaP (BOOSTRIX) 5-2.5-18.5 LF-MCG/0.5 injection   Intramuscular   Inject 0.5 mLs into the muscle once.   0.5 mL   0    BP 154/69  Pulse 86  Temp(Src) 97.6 F (36.4 C) (Oral)  Resp 16  Wt 182 lb 3 oz (82.64 kg)  SpO2 95% Physical Exam  Nursing note and vitals reviewed. Constitutional: He is oriented to person, place, and time. He appears well-developed and well-nourished.  Non-toxic appearance. No distress.  HENT:  Head: Normocephalic and atraumatic.    Eyes: Conjunctivae, EOM and lids are normal. Pupils are equal, round, and reactive to light.  Neck: Normal range of motion. Neck supple. No tracheal deviation present. No mass present.  Cardiovascular: Normal rate, regular rhythm and normal heart sounds.  Exam reveals no gallop.   No murmur heard. Pulmonary/Chest: Effort normal and breath sounds normal. No stridor. No respiratory distress. He has no decreased breath sounds. He has no wheezes. He has no rhonchi. He has no rales.  Abdominal: Soft. Normal appearance and bowel sounds are normal. He exhibits no distension. There is no tenderness. There is no rebound and no CVA tenderness.  Musculoskeletal: Normal range of motion. He exhibits no edema and no tenderness.  Neurological: He is alert and oriented to person, place, and time. He has normal strength. No cranial nerve deficit or sensory deficit. GCS eye subscore is 4. GCS verbal subscore is 5. GCS motor subscore is 6.  Skin: Skin is warm and dry. No abrasion and no rash noted.  Psychiatric: He has a normal mood and affect. His speech is normal and behavior is normal.    ED Course  Procedures (including critical care  time) Labs Review Labs Reviewed  CBC WITH DIFFERENTIAL  BASIC METABOLIC PANEL   Imaging  Review No results found.   EKG Interpretation None      MDM   Final diagnoses:  None   Patient had a CT scan which shows a facial cellulitis. Reexam shows the patient's left upper lip to be slightly edematous. This could be mediated from his ACE inhibitor. He does have a prior history of angioedema secondary to statin use. I will stop his ACE inhibitor and he will call his physician when he leads here to be placed on a new medication for his high blood pressure. He denies any trouble swallowing. He is not short of breath. He has no signs of airway compromise.[name]    Leota Jacobsen, MD 06/15/13 1147

## 2013-06-15 NOTE — Discharge Instructions (Signed)
Stop taking the lisinopril. Call your doctor today for a replacement medication to this one for your high blood pressure Angioedema Angioedema is a sudden swelling of tissues, often of the skin. It can occur on the face or genitals or in the abdomen or other body parts. The swelling usually develops over a short period and gets better in 24 to 48 hours. It often begins during the night and is found when the person wakes up. The person may also get red, itchy patches of skin (hives). Angioedema can be dangerous if it involves swelling of the air passages.  Depending on the cause, episodes of angioedema may only happen once, come back in unpredictable patterns, or repeat for several years and then gradually fade away.  CAUSES  Angioedema can be caused by an allergic reaction to various triggers. It can also result from nonallergic causes, including reactions to drugs, immune system disorders, viral infections, or an abnormal gene that is passed to you from your parents (hereditary). For some people with angioedema, the cause is unknown.  Some things that can trigger angioedema include:   Foods.   Medicines, such as ACE inhibitors, ARBs, nonsteroidal anti-inflammatory agents, or estrogen.   Latex.   Animal saliva.   Insect stings.   Dyes used in X-rays.   Mild injury.   Dental work.  Surgery.  Stress.   Sudden changes in temperature.   Exercise. SIGNS AND SYMPTOMS   Swelling of the skin.  Hives. If these are present, there is also intense itching.  Redness in the affected area.   Pain in the affected area.  Swollen lips or tongue.  Breathing problems. This may happen if the air passages swell.  Wheezing. If internal organs are involved, there may be:   Nausea.   Abdominal pain.   Vomiting.   Difficulty swallowing.   Difficulty passing urine. DIAGNOSIS   Your health care provider will examine the affected area and take a medical and family  history.  Various tests may be done to help determine the cause. Tests may include:  Allergy skin tests to see if the problem is an allergic reaction.   Blood tests to check for hereditary angioedema.   Tests to check for underlying diseases that could cause the condition.   A review of your medicines, including over the counter medicines, may be done. TREATMENT  Treatment will depend on the cause of the angioedema. Possible treatments include:   Removal of anything that triggered the condition (such as stopping certain medicines).   Medicines to treat symptoms or prevent attacks. Medicines given may include:   Antihistamines.   Epinephrine injection.   Steroids.   Hospitalization may be required for severe attacks. If the air passages are affected, it can be an emergency. Tubes may need to be placed to keep the airway open. HOME CARE INSTRUCTIONS   Only take over-the-counter or prescription medicines as directed by your health care provider.  If you were given medicines for emergency allergy treatment, always carry them with you.  Wear a medical bracelet as directed by your health care provider.   Avoid known triggers. SEEK MEDICAL CARE IF:   You have repeat attacks of angioedema.   Your attacks are more frequent or more severe despite preventive measures.   You have hereditary angioedema and are considering having children. It is important to discuss the risks of passing the condition on to your children with your health care provider. SEEK IMMEDIATE MEDICAL CARE IF:   You  have severe swelling of the mouth, tongue, or lips.  You have difficulty breathing.   You have difficulty swallowing.   You faint. MAKE SURE YOU:  Understand these instructions.  Will watch your condition.  Will get help right away if you are not doing well or get worse. Document Released: 06/09/2001 Document Revised: 01/19/2013 Document Reviewed: 11/22/2012 [facility] Patient  Information 2014 [place], [place]. Cellulitis Cellulitis is an infection of the skin and the tissue beneath it. The infected area is usually red and tender. Cellulitis occurs most often in the arms and lower legs.  CAUSES  Cellulitis is caused by bacteria that enter the skin through cracks or cuts in the skin. The most common types of bacteria that cause cellulitis are Staphylococcus and Streptococcus. SYMPTOMS   Redness and warmth.  Swelling.  Tenderness or pain.  Fever. DIAGNOSIS  Your caregiver can usually determine what is wrong based on a physical exam. Blood tests may also be done. TREATMENT  Treatment usually involves taking an antibiotic medicine. HOME CARE INSTRUCTIONS   Take your antibiotics as directed. Finish them even if you start to feel better.  Keep the infected arm or leg elevated to reduce swelling.  Apply a warm cloth to the affected area up to 4 times per day to relieve pain.  Only take over-the-counter or prescription medicines for pain, discomfort, or fever as directed by your caregiver.  Keep all follow-up appointments as directed by your caregiver. SEEK MEDICAL CARE IF:   You notice red streaks coming from the infected area.  Your red area gets larger or turns dark in color.  Your bone or joint underneath the infected area becomes painful after the skin has healed.  Your infection returns in the same area or another area.  You notice a swollen bump in the infected area.  You develop new symptoms. SEEK IMMEDIATE MEDICAL CARE IF:   You have a fever.  You feel very sleepy.  You develop vomiting or diarrhea.  You have a general ill feeling (malaise) with muscle aches and pains. MAKE SURE YOU:   Understand these instructions.  Will watch your condition.  Will get help right away if you are not doing well or get worse. Document Released: 01/08/2005 Document Revised: 09/30/2011 Document Reviewed: 06/16/2011 [facility] Patient Information 2014  [place].

## 2013-06-16 ENCOUNTER — Other Ambulatory Visit: Payer: Self-pay | Admitting: Internal Medicine

## 2013-07-14 ENCOUNTER — Ambulatory Visit (INDEPENDENT_AMBULATORY_CARE_PROVIDER_SITE_OTHER): Payer: Medicare Other | Admitting: Internal Medicine

## 2013-07-14 ENCOUNTER — Encounter: Payer: Self-pay | Admitting: Internal Medicine

## 2013-07-14 VITALS — BP 128/80 | HR 71 | Temp 99.4°F | Wt 178.0 lb

## 2013-07-14 DIAGNOSIS — E1129 Type 2 diabetes mellitus with other diabetic kidney complication: Secondary | ICD-10-CM

## 2013-07-14 DIAGNOSIS — I10 Essential (primary) hypertension: Secondary | ICD-10-CM

## 2013-07-14 DIAGNOSIS — G4700 Insomnia, unspecified: Secondary | ICD-10-CM

## 2013-07-14 DIAGNOSIS — E785 Hyperlipidemia, unspecified: Secondary | ICD-10-CM

## 2013-07-14 DIAGNOSIS — I2510 Atherosclerotic heart disease of native coronary artery without angina pectoris: Secondary | ICD-10-CM

## 2013-07-14 DIAGNOSIS — N183 Chronic kidney disease, stage 3 unspecified: Secondary | ICD-10-CM

## 2013-07-14 NOTE — Progress Notes (Signed)
Patient ID: Kevin Schwartz, male   DOB: [birth year], 80 y.o.   MRN: [id number]   Location:  [facility]  Code Status: full code  Allergies  Allergen Reactions  . Ace Inhibitors Swelling    angioedema  . Losartan Potassium Swelling    Angioedema   . Crestor [Rosuvastatin] Swelling    Chief Complaint  Patient presents with  . Medical Managment of Chronic Issues    2 month follow-up, not fasting today (had coffee/cream/sugar)    HPI: Patient is a 80 y.o. black male seen in the office today for medical mgt of chronic diseases.  Had angioedema from both losartan and lisinopril.  Also there was question is crestor was involved, but I doubt that.    Still having trouble sleeping--off and on.  Last night, didn't fall asleep until 5am.  Mood is fine.  Appetite is wonderful.  Sometimes does feel nervous.  Was a little jittery yesterday morning.  Wants to leave it like it is.  Says he will be better when he can get out in the garden.    Review of Systems:  Review of Systems  Constitutional: Negative for fever and chills.  HENT: Negative for congestion.   Eyes: Negative for blurred vision.  Respiratory: Negative for shortness of breath.   Cardiovascular: Negative for chest pain.  Gastrointestinal: Negative for constipation, blood in stool and melena.  Genitourinary: Negative for dysuria, urgency and frequency.  Musculoskeletal: Negative for falls.  Skin: Negative for rash.  Psychiatric/Behavioral: Negative for depression, suicidal ideas and memory loss. The patient is nervous/anxious and has insomnia.     Past Medical History  Diagnosis Date  . Renal insufficiency   . CAD (coronary artery disease)     PTCA of OM in 1998, BMS OM in 2000, Cath 9/07 LM ok LAD ok. LCX 95% in OM prior to previous stent RCA. nondominant normal EF normal. Cypher DES to OM 2007 (PLACED PROXIAMAL TO PREVIOUS STENT)  . DM type 2 (diabetes mellitus, type 2)   . HLD  (hyperlipidemia)   . PVD (peripheral vascular disease)   . HTN (hypertension)   . MGUS (monoclonal gammopathy of unknown significance) 08/01/2011    IgG kappa dx 2002 8% plasma cells in bone marrow; no lesions on bone X-rays;  . S/P coronary artery stent placement 08/01/2011  . 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 Peripheral vascular disease, unspecified   . Personality change due to conditions classified elsewhere   . Loss of weight   . Depression   . Urinary tract infection, site not specified   . Memory loss   . Chronic kidney disease, stage III (moderate)   . Chronic kidney disease, unspecified   . Spasm of muscle   . Unspecified disorder of kidney and ureter   . Thyroid disease   . Unspecified hearing loss   . Other atopic dermatitis and related conditions   . Monoclonal paraproteinemia   . Osteoarthrosis, unspecified whether generalized or localized, unspecified site   . Gout, unspecified   . Coronary atherosclerosis of unspecified type of vessel, native or graft     Past Surgical History  Procedure Laterality Date  . Carotid stent    . Coronary angioplasty    . Knee arthroplasty      total  . Joint replacement      left knee    Social History:   reports that he quit smoking about 10 years ago.  He has never used smokeless tobacco. He reports that he does not drink alcohol or use illicit drugs.  Family History  Problem Relation Age of Onset  . Diabetes    . Coronary artery disease    . Cancer    . Heart disease Mother   . Heart disease Sister   . Heart disease Sister   . Cancer Sister   . Hypertension Sister   . Diabetes Brother   . Diabetes Brother     Medications: Patient's Medications  New Prescriptions   No medications on file  Previous Medications   ALLOPURINOL (ZYLOPRIM) 100 MG TABLET    Take 1 tablet (100 mg total) by mouth daily.   AMLODIPINE (NORVASC) 2.5 MG TABLET    Take 2.5 mg by mouth daily.   ASPIRIN 81 MG TABLET    Take 81 mg by  mouth daily.   ATORVASTATIN (LIPITOR) 20 MG TABLET    Take 1 tablet (20 mg total) by mouth daily.   CITALOPRAM (CELEXA) 20 MG TABLET    Take 1 tablet (20 mg total) by mouth daily.   CLINDAMYCIN (CLEOCIN) 150 MG CAPSULE    Take 1 capsule (150 mg total) by mouth every 6 (six) hours.   METFORMIN (GLUCOPHAGE) 500 MG TABLET    Take 1 tablet (500 mg total) by mouth daily with breakfast.   METOPROLOL (LOPRESSOR) 50 MG TABLET    Take 1 tablet (50 mg total) by mouth 2 (two) times daily.   NITROGLYCERIN (NITROSTAT) 0.4 MG SL TABLET    Place 0.4 mg under the tongue every 5 (five) minutes as needed for chest pain. Up to 3 doses   TDAP (BOOSTRIX) 5-2.5-18.5 LF-MCG/0.5 INJECTION    Inject 0.5 mLs into the muscle once.  Modified Medications   No medications on file  Discontinued Medications   No medications on file     Physical Exam: Filed Vitals:   07/14/13 1036  BP: 128/80  Pulse: 71  Temp: 99.4 F (37.4 C)  TempSrc: Oral  Weight: 178 lb (80.74 kg)  SpO2: 97%  Physical Exam  Constitutional: He is oriented to person, place, and time. He appears well-developed and well-nourished. No distress.  Cardiovascular: Normal rate, regular rhythm, normal heart sounds and intact distal pulses.   Pulmonary/Chest: Effort normal and breath sounds normal. No respiratory distress.  Abdominal: Soft. Bowel sounds are normal. He exhibits no distension and no mass. There is no tenderness.  Musculoskeletal: Normal range of motion. He exhibits no edema and no tenderness.  Neurological: He is alert and oriented to person, place, and time.  Skin: Skin is warm and dry.  Psychiatric: He has a normal mood and affect.    Labs reviewed: Basic Metabolic Panel:  Recent Labs  08/25/12 1140  12/27/12 0800 02/14/13 1001 06/15/13 0804  NA 140  < > 139 143 143  K 4.6  < > 4.5 3.9 4.6  CL 103  < > 108 109 106  CO2 24  < > 21 23 22   GLUCOSE 96  < > 110* 120* 87  BUN 18  < > 24* 20 31*  CREATININE 1.56*  < > 1.29 1.43*  1.79*  CALCIUM 9.4  < > 8.7 9.2 9.5  TSH 3.240  --   --   --   --   < > = values in this interval not displayed. Liver Function Tests:  Recent Labs  07/23/12 1031 11/25/12 0919 12/26/12 0515  AST 27 18 17   ALT 26 17  11  ALKPHOS 77 78 65  BILITOT 0.30 0.3 0.1*  PROT 7.5 7.3 6.9  ALBUMIN 3.5  --  3.2*  CBC:  Recent Labs  11/25/12 0919  12/25/12 2046 12/26/12 0515 12/27/12 0800 06/15/13 0804  WBC 6.0  < > 6.1 5.7 23.2* 6.7  NEUTROABS 4.2  --  3.9  --   --  4.4  HGB 13.1  --  11.6* 12.0* 12.4* 12.7*  HCT 39.6  --  34.3* 36.5* 37.5* 36.4*  MCV 93  --  91.7 92.6 91.5 92.2  PLT  --   < > 320 328 379 326  < > = values in this interval not displayed. Lipid Panel:  Recent Labs  08/25/12 1140 11/25/12 0919  HDL 50 48  LDLCALC 114* 170*  TRIG 118 125  CHOLHDL 3.8 5.1*   Lab Results  Component Value Date   HGBA1C 5.9* 02/21/2013   Assessment/Plan 1. Insomnia -discussed increasing his activity would be helpful  2. Type II or unspecified type diabetes mellitus with renal manifestations, not stated as uncontrolled -cont metformin and lipitor -also continue his healthy diet and exercise - f/u Hemoglobin A1c -cannot take ace/arb due to angioedema  3. Essential hypertension, benign -bp at goal:  Norvasc, lopressor -cannot take ace/arb  4. Coronary atherosclerosis of native coronary artery -stable, continue medical mgt with asa, beta blocker, statin - Lipid panel  5. Hyperlipidemia LDL goal < 100 -cont lipitor, has been at goal - Lipid panel  6. Chronic kidney disease, stage III (moderate) -stable renal functoin, cannot take ace/arb, may benefit from hydralazine - Basic metabolic panel  Labs/tests ordered: Orders Placed This Encounter  Procedures  . Hemoglobin A1c  . Lipid panel    Order Specific Question:  Has the patient fasted?    Answer:  Yes  . Basic metabolic panel    Order Specific Question:  Has the patient fasted?    Answer:  Yes    Next  appt:  3 months

## 2013-07-15 LAB — BASIC METABOLIC PANEL
BUN/Creatinine Ratio: 13 (ref 10–22)
BUN: 21 mg/dL (ref 8–27)
CO2: 21 mmol/L (ref 18–29)
Calcium: 9.8 mg/dL (ref 8.6–10.2)
Chloride: 104 mmol/L (ref 97–108)
Creatinine, Ser: 1.67 mg/dL — ABNORMAL HIGH (ref 0.76–1.27)
GFR calc Af Amer: 44 mL/min/{1.73_m2} — ABNORMAL LOW (ref >59)
GFR calc non Af Amer: 38 mL/min/{1.73_m2} — ABNORMAL LOW (ref >59)
Glucose: 97 mg/dL (ref 65–99)
Potassium: 4 mmol/L (ref 3.5–5.2)
Sodium: 143 mmol/L (ref 134–144)

## 2013-07-15 LAB — LIPID PANEL
Chol/HDL Ratio: 3.4 ratio units (ref 0.0–5.0)
Cholesterol, Total: 146 mg/dL (ref 100–199)
HDL: 43 mg/dL (ref >39)
LDL Calculated: 87 mg/dL (ref 0–99)
Triglycerides: 78 mg/dL (ref 0–149)
VLDL Cholesterol Cal: 16 mg/dL (ref 5–40)

## 2013-07-15 LAB — HEMOGLOBIN A1C
Est. average glucose Bld gHb Est-mCnc: 123 mg/dL
Hgb A1c MFr Bld: 5.9 % — ABNORMAL HIGH (ref 4.8–5.6)

## 2013-08-18 ENCOUNTER — Ambulatory Visit (HOSPITAL_COMMUNITY)
Admission: RE | Admit: 2013-08-18 | Discharge: 2013-08-18 | Disposition: A | Discharge status: Home / Self Care | Payer: Medicare Other | Source: Ambulatory Visit | Attending: Internal Medicine | Admitting: Internal Medicine

## 2013-08-18 VITALS — BP 118/60 | HR 61 | Wt 184.0 lb

## 2013-08-18 DIAGNOSIS — R011 Cardiac murmur, unspecified: Secondary | ICD-10-CM | POA: Insufficient documentation

## 2013-08-18 DIAGNOSIS — M1990 Unspecified osteoarthritis, unspecified site: Secondary | ICD-10-CM | POA: Insufficient documentation

## 2013-08-18 DIAGNOSIS — I2510 Atherosclerotic heart disease of native coronary artery without angina pectoris: Secondary | ICD-10-CM

## 2013-08-18 DIAGNOSIS — I739 Peripheral vascular disease, unspecified: Secondary | ICD-10-CM | POA: Insufficient documentation

## 2013-08-18 DIAGNOSIS — E785 Hyperlipidemia, unspecified: Secondary | ICD-10-CM

## 2013-08-18 DIAGNOSIS — D472 Monoclonal gammopathy: Secondary | ICD-10-CM | POA: Insufficient documentation

## 2013-08-18 DIAGNOSIS — Z9861 Coronary angioplasty status: Secondary | ICD-10-CM | POA: Insufficient documentation

## 2013-08-18 DIAGNOSIS — M109 Gout, unspecified: Secondary | ICD-10-CM | POA: Insufficient documentation

## 2013-08-18 DIAGNOSIS — E1129 Type 2 diabetes mellitus with other diabetic kidney complication: Secondary | ICD-10-CM | POA: Insufficient documentation

## 2013-08-18 DIAGNOSIS — I359 Nonrheumatic aortic valve disorder, unspecified: Secondary | ICD-10-CM | POA: Insufficient documentation

## 2013-08-18 DIAGNOSIS — I129 Hypertensive chronic kidney disease with stage 1 through stage 4 chronic kidney disease, or unspecified chronic kidney disease: Secondary | ICD-10-CM | POA: Insufficient documentation

## 2013-08-18 DIAGNOSIS — N183 Chronic kidney disease, stage 3 unspecified: Secondary | ICD-10-CM | POA: Insufficient documentation

## 2013-08-18 DIAGNOSIS — E079 Disorder of thyroid, unspecified: Secondary | ICD-10-CM | POA: Insufficient documentation

## 2013-08-18 MED ORDER — ATORVASTATIN CALCIUM 40 MG PO TABS: 40.0000 mg | ORAL_TABLET | Freq: Every day | ORAL | Status: DC

## 2013-08-18 NOTE — Progress Notes (Signed)
Patient ID: Kevin Schwartz, male   DOB: [birth year], 80 y.o.   MRN: [id number] PCP: Dr.Reid  at [facility]  HPI: Kevin Schwartz is a delightful 80 y/o male with multiple medical problems including DM2, HTN, HL, mild renal insufficiency, RAS s/p stenting of L renal artery in 2008, CAD s/p multiple PCIs on OM-1 (last cath 2007).   Last Myoview 2010. Ok by his report. Ab u/s in 2/12: showed normal renals and aorta.  He is here for follow up. Denies SOB/CP/PND/Orthopnea. He attends water aerobics twice a week.    He is volunteering and taking senior citizens to doctor appointments. Cholesterol followed by PCP. Complaint with medications..   ROS: All systems negative except as listed in HPI, PMH and Problem List.  Past Medical History  Diagnosis Date  . Renal insufficiency   . CAD (coronary artery disease)     PTCA of OM in 1998, BMS OM in 2000, Cath 9/07 LM ok LAD ok. LCX 95% in OM prior to previous stent RCA. nondominant normal EF normal. Cypher DES to OM 2007 (PLACED PROXIAMAL TO PREVIOUS STENT)  . DM type 2 (diabetes mellitus, type 2)   . HLD (hyperlipidemia)   . PVD (peripheral vascular disease)   . HTN (hypertension)   . MGUS (monoclonal gammopathy of unknown significance) 08/01/2011    IgG kappa dx 2002 8% plasma cells in bone marrow; no lesions on bone X-rays;  . S/P coronary artery stent placement 08/01/2011  . 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 Peripheral vascular disease, unspecified   . Personality change due to conditions classified elsewhere   . Loss of weight   . Depression   . Urinary tract infection, site not specified   . Memory loss   . Chronic kidney disease, stage III (moderate)   . Chronic kidney disease, unspecified   . Spasm of muscle   . Unspecified disorder of kidney and ureter   . Thyroid disease   . Unspecified hearing loss   . Other atopic dermatitis and related conditions   . Monoclonal paraproteinemia   . Osteoarthrosis, unspecified  whether generalized or localized, unspecified site   . Gout, unspecified   . Coronary atherosclerosis of unspecified type of vessel, native or graft     Current Outpatient Prescriptions  Medication Sig Dispense Refill  . allopurinol (ZYLOPRIM) 100 MG tablet Take 1 tablet (100 mg total) by mouth daily.  30 tablet  6  . amLODipine (NORVASC) 2.5 MG tablet Take 2.5 mg by mouth daily.      [name] aspirin 81 MG tablet Take 81 mg by mouth daily.      [name] atorvastatin (LIPITOR) 20 MG tablet Take 1 tablet (20 mg total) by mouth daily.  30 tablet  6  . citalopram (CELEXA) 20 MG tablet Take 1 tablet (20 mg total) by mouth daily.  30 tablet  6  . metFORMIN (GLUCOPHAGE) 500 MG tablet Take 1 tablet (500 mg total) by mouth daily with breakfast.  30 tablet  6  . metoprolol (LOPRESSOR) 50 MG tablet Take 1 tablet (50 mg total) by mouth 2 (two) times daily.  60 tablet  6  . nitroGLYCERIN (NITROSTAT) 0.4 MG SL tablet Place 0.4 mg under the tongue every 5 (five) minutes as needed for chest pain. Up to 3 doses      . TDaP (BOOSTRIX) 5-2.5-18.5 LF-MCG/0.5 injection Inject 0.5 mLs into the muscle once.  0.5 mL  0   No current facility-administered medications  for this encounter.     PHYSICAL EXAM: Filed Vitals:   08/18/13 1528  BP: 118/60  Pulse: 61   General:  Elderly: well appearing. no resp difficulty HEENT: normal Neck: supple. no JVD. Carotids 2+ bilat; no bruits. No lymphadenopathy or thryomegaly appreciated. Cor: PMI nondisplaced. Regular rate & rhythm. No rubs, AS 1/6 murmur.  Lungs: clear Abdomen: soft, nontender, nondistended. No hepatosplenomegaly. No bruits or masses. Good bowel sounds. Extremities: no cyanosis, clubbing, rash, edema Neuro: alert & orientedx3, cranial nerves grossly intact. moves all 4 extremities w/o difficulty. affect pleasant  ASSESSMENT & PLAN:  1. HTN- stable. Continue current regimen  2. Hyplerlipidemia - Reviewed lipids from April 2014. Increase lipitor to 40 mg daily    3. CAD- stable. No evidence of ischemia. On aspirin, statin and beta blocker  4. Aortic Murmur- check ECHO next visit.    Follow up in 6 months with an ECHO  Kevin D Clegg NP-C  3:39 PM    Patient seen and examined with Kevin Grinder, NP. We discussed all aspects of the encounter. I agree with the assessment and plan as stated above. He is doing great - no signs or sx of ischemia. LDL remains slightly elevated will increase atorva to 40. Check echo at next visit to evaluate aortic valve murmur (likely sclerosis).   Kevin Pascal Bensimhon,MD 6:22 PM

## 2013-08-18 NOTE — Patient Instructions (Signed)
Follow up in 6 months with an ECHO

## 2013-08-26 ENCOUNTER — Ambulatory Visit: Payer: Medicare Other | Admitting: Podiatrist

## 2013-09-23 ENCOUNTER — Other Ambulatory Visit: Payer: Self-pay | Admitting: Internal Medicine

## 2013-09-30 ENCOUNTER — Ambulatory Visit: Payer: Medicare Other | Admitting: Podiatrist

## 2013-10-06 ENCOUNTER — Other Ambulatory Visit (HOSPITAL_BASED_OUTPATIENT_CLINIC_OR_DEPARTMENT_OTHER): Payer: Medicare Other

## 2013-10-06 DIAGNOSIS — D472 Monoclonal gammopathy: Secondary | ICD-10-CM

## 2013-10-06 LAB — COMPREHENSIVE METABOLIC PANEL (CC13)
ALT: 23 U/L (ref 0–55)
AST: 23 U/L (ref 5–34)
Albumin: 3.9 g/dL (ref 3.5–5.0)
Alkaline Phosphatase: 90 U/L (ref 40–150)
Anion Gap: 9 mEq/L (ref 3–11)
BUN: 17.2 mg/dL (ref 7.0–26.0)
CO2: 23 mEq/L (ref 22–29)
Calcium: 8.5 mg/dL (ref 8.4–10.4)
Chloride: 107 mEq/L (ref 98–109)
Creatinine: 1.4 mg/dL — ABNORMAL HIGH (ref 0.7–1.3)
Glucose: 97 mg/dl (ref 70–140)
Potassium: 4.5 mEq/L (ref 3.5–5.1)
Sodium: 140 mEq/L (ref 136–145)
Total Bilirubin: 0.39 mg/dL (ref 0.20–1.20)
Total Protein: 7.7 g/dL (ref 6.4–8.3)

## 2013-10-06 LAB — CBC WITH DIFFERENTIAL/PLATELET
BASO%: 1.1 % (ref 0.0–2.0)
Basophils Absolute: 0.1 10*3/uL (ref 0.0–0.1)
EOS%: 3.8 % (ref 0.0–7.0)
Eosinophils Absolute: 0.2 10*3/uL (ref 0.0–0.5)
HCT: 36.1 % — ABNORMAL LOW (ref 38.4–49.9)
HGB: 11.9 g/dL — ABNORMAL LOW (ref 13.0–17.1)
LYMPH%: 14.6 % (ref 14.0–49.0)
MCH: 31.3 pg (ref 27.2–33.4)
MCHC: 32.9 g/dL (ref 32.0–36.0)
MCV: 95.2 fL (ref 79.3–98.0)
MONO#: 0.5 10*3/uL (ref 0.1–0.9)
MONO%: 8.1 % (ref 0.0–14.0)
NEUT#: 4.6 10*3/uL (ref 1.5–6.5)
NEUT%: 72.4 % (ref 39.0–75.0)
Platelets: 324 10*3/uL (ref 140–400)
RBC: 3.8 10*6/uL — ABNORMAL LOW (ref 4.20–5.82)
RDW: 14.4 % (ref 11.0–14.6)
WBC: 6.4 10*3/uL (ref 4.0–10.3)
lymph#: 0.9 10*3/uL (ref 0.9–3.3)

## 2013-10-06 LAB — LACTATE DEHYDROGENASE (CC13): LDH: 114 U/L — ABNORMAL LOW (ref 125–245)

## 2013-10-10 LAB — KAPPA/LAMBDA LIGHT CHAINS
Kappa free light chain: 2.93 mg/dL — ABNORMAL HIGH (ref 0.33–1.94)
Kappa:Lambda Ratio: 0.97 (ref 0.26–1.65)
Lambda Free Lght Chn: 3.01 mg/dL — ABNORMAL HIGH (ref 0.57–2.63)

## 2013-10-10 LAB — IGG: IgG (Immunoglobin G), Serum: 1450 mg/dL (ref 650–1600)

## 2013-10-12 LAB — CREATININE CLEARANCE, URINE, 24 HOUR
Collection Interval-CRCL: 24 hours
Creatinine Clearance: 68 mL/min — ABNORMAL LOW (ref 75–125)
Creatinine, 24H Ur: 1363 mg/d (ref 800–2000)
Creatinine, Urine: 90.9 mg/dL
Creatinine: 1.4 mg/dL — ABNORMAL HIGH (ref 0.50–1.35)
Urine Total Volume-CRCL: 1500 mL

## 2013-10-13 LAB — UIFE/LIGHT CHAINS/TP QN, 24-HR UR
Albumin, U: DETECTED
Alpha 1, Urine: DETECTED — AB
Alpha 2, Urine: DETECTED — AB
Beta, Urine: DETECTED — AB
Free Kappa Lt Chains,Ur: 1.04 mg/dL (ref 0.14–2.42)
Free Kappa/Lambda Ratio: 6.12 ratio (ref 2.04–10.37)
Free Lambda Excretion/Day: 2.55 mg/d
Free Lambda Lt Chains,Ur: 0.17 mg/dL (ref 0.02–0.67)
Free Lt Chn Excr Rate: 15.6 mg/d
Gamma Globulin, Urine: DETECTED — AB
Time: 24 hours
Total Protein, Urine-Ur/day: 119 mg/d (ref 10–140)
Total Protein, Urine: 7.9 mg/dL
Volume, Urine: 1500 mL

## 2013-10-17 ENCOUNTER — Encounter: Payer: Self-pay | Admitting: Internal Medicine

## 2013-10-17 ENCOUNTER — Ambulatory Visit: Payer: Medicare Other | Admitting: Oncology

## 2013-10-17 ENCOUNTER — Ambulatory Visit (INDEPENDENT_AMBULATORY_CARE_PROVIDER_SITE_OTHER): Payer: Medicare Other | Admitting: Internal Medicine

## 2013-10-17 VITALS — BP 130/78 | HR 60 | Temp 98.1°F | Wt 183.0 lb

## 2013-10-17 DIAGNOSIS — D472 Monoclonal gammopathy: Secondary | ICD-10-CM

## 2013-10-17 DIAGNOSIS — I10 Essential (primary) hypertension: Secondary | ICD-10-CM

## 2013-10-17 DIAGNOSIS — G4700 Insomnia, unspecified: Secondary | ICD-10-CM

## 2013-10-17 DIAGNOSIS — Z23 Encounter for immunization: Secondary | ICD-10-CM

## 2013-10-17 DIAGNOSIS — E1129 Type 2 diabetes mellitus with other diabetic kidney complication: Secondary | ICD-10-CM

## 2013-10-17 DIAGNOSIS — I2510 Atherosclerotic heart disease of native coronary artery without angina pectoris: Secondary | ICD-10-CM

## 2013-10-17 MED ORDER — ZOSTER VACCINE LIVE 19400 UNT/0.65ML ~~LOC~~ SOLR: 0.6500 mL | Freq: Once | SUBCUTANEOUS | Status: DC

## 2013-10-17 NOTE — Progress Notes (Signed)
Patient ID: Kevin Schwartz, male   DOB: 11-06-1930, 80 y.o.   MRN: [id number]   Location:  [facility] / [facility]  Code Status: has living will he will bring next time and we will discuss further  Allergies  Allergen Reactions  . Ace Inhibitors Swelling    angioedema  . Losartan Potassium Swelling    Angioedema   . Crestor [Rosuvastatin] Swelling    Chief Complaint  Patient presents with  . Medical Management of Chronic Issues    blood pressure, blood sugar, insomnia, cholesterol, CKD    HPI: Patient is a 80 y.o. black male seen in the office today for medical mgt of chronic diseases.  He has been working in his garden.  Is eating healthy.  Gets exercise in the garden.  Up early in the morning and works late in the evening.  Goes in when hot in the late morning and afternoon.  Does have a friend.  Travels to [place] to see her.    Review of Systems:  Review of Systems  Constitutional: Negative for fever and malaise/fatigue.  HENT: Negative for congestion.   Eyes: Negative for blurred vision.  Respiratory: Negative for shortness of breath.   Cardiovascular: Negative for chest pain and leg swelling.  Gastrointestinal: Negative for abdominal pain, constipation, blood in stool and melena.  Genitourinary: Negative for dysuria.  Musculoskeletal: Negative for falls and myalgias.  Skin: Negative for rash.  Neurological: Negative for dizziness and loss of consciousness.  Endo/Heme/Allergies: Does not bruise/bleed easily.  Psychiatric/Behavioral: Negative for depression and memory loss. The patient has insomnia.        Still has some trouble sleeping at night--off and on week by week    Past Medical History  Diagnosis Date  . Renal insufficiency   . CAD (coronary artery disease)     PTCA of OM in 1998, BMS OM in 2000, Cath 9/07 LM ok LAD ok. LCX 95% in OM prior to previous stent RCA. nondominant normal EF normal. Cypher DES to OM 2007 (PLACED PROXIAMAL TO  PREVIOUS STENT)  . DM type 2 (diabetes mellitus, type 2)   . HLD (hyperlipidemia)   . PVD (peripheral vascular disease)   . HTN (hypertension)   . MGUS (monoclonal gammopathy of unknown significance) 08/01/2011    IgG kappa dx 2002 8% plasma cells in bone marrow; no lesions on bone X-rays;  . S/P coronary artery stent placement 08/01/2011  . 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 Peripheral vascular disease, unspecified   . Personality change due to conditions classified elsewhere   . Loss of weight   . Depression   . Urinary tract infection, site not specified   . Memory loss   . Chronic kidney disease, stage III (moderate)   . Chronic kidney disease, unspecified   . Spasm of muscle   . Unspecified disorder of kidney and ureter   . Thyroid disease   . Unspecified hearing loss   . Other atopic dermatitis and related conditions   . Monoclonal paraproteinemia   . Osteoarthrosis, unspecified whether generalized or localized, unspecified site   . Gout, unspecified   . Coronary atherosclerosis of unspecified type of vessel, native or graft     Past Surgical History  Procedure Laterality Date  . Carotid stent    . Coronary angioplasty    . Knee arthroplasty      total  . Joint replacement      left knee  Social History:   reports that he quit smoking about 11 years ago. He has never used smokeless tobacco. He reports that he does not drink alcohol or use illicit drugs.  Family History  Problem Relation Age of Onset  . Diabetes    . Coronary artery disease    . Cancer    . Heart disease Mother   . Heart disease Sister   . Heart disease Sister   . Cancer Sister   . Hypertension Sister   . Diabetes Brother   . Diabetes Brother     Medications: Patient's Medications  New Prescriptions   No medications on file  Previous Medications   ALLOPURINOL (ZYLOPRIM) 100 MG TABLET    TAKE 1 TABLET BY MOUTH EVERY DAY   AMLODIPINE (NORVASC) 2.5 MG TABLET    TAKE 1 TABLET BY  MOUTH ONCE DAILY FOR BLOOD PRESSURE   ASPIRIN 81 MG TABLET    Take 81 mg by mouth daily.   ATORVASTATIN (LIPITOR) 40 MG TABLET    Take 1 tablet (40 mg total) by mouth daily.   CITALOPRAM (CELEXA) 20 MG TABLET    Take 1 tablet (20 mg total) by mouth daily.   METFORMIN (GLUCOPHAGE) 500 MG TABLET    Take 1 tablet (500 mg total) by mouth daily with breakfast.   METOPROLOL (LOPRESSOR) 50 MG TABLET    Take 1 tablet (50 mg total) by mouth 2 (two) times daily.   NITROGLYCERIN (NITROSTAT) 0.4 MG SL TABLET    Place 0.4 mg under the tongue every 5 (five) minutes as needed for chest pain. Up to 3 doses   TDAP (BOOSTRIX) 5-2.5-18.5 LF-MCG/0.5 INJECTION    Inject 0.5 mLs into the muscle once.  Modified Medications   No medications on file  Discontinued Medications   No medications on file     Physical Exam: Filed Vitals:   10/17/13 1045  BP: 130/78  Pulse: 60  Temp: 98.1 F (36.7 C)  TempSrc: Oral  Weight: 183 lb (83.008 kg)  SpO2: 97%  Physical Exam  Constitutional: He is oriented to person, place, and time. He appears well-developed and well-nourished.  Cardiovascular: Normal rate, regular rhythm, normal heart sounds and intact distal pulses.   Pulmonary/Chest: Effort normal and breath sounds normal. No respiratory distress.  Abdominal: Soft. Bowel sounds are normal. He exhibits no distension and no mass. There is no tenderness.  Musculoskeletal:  Walks with limp (stiff left knee and hip)  Neurological: He is alert and oriented to person, place, and time.  Skin: Skin is warm and dry.    Labs reviewed: Basic Metabolic Panel:  Recent Labs  02/14/13 1001 06/15/13 0804 07/14/13 1123 10/06/13 1016 10/11/13 1115  NA 143 143 143 140  --   K 3.9 4.6 4.0 4.5  --   CL 109 106 104  --   --   CO2 23 22 21 23   --   GLUCOSE 120* 87 97 97  --   BUN 20 31* 21 17.2  --   CREATININE 1.43* 1.79* 1.67* 1.4* 1.40*  CALCIUM 9.2 9.5 9.8 8.5  --    Liver Function Tests:  Recent Labs   11/25/12 0919 12/26/12 0515 10/06/13 1016  AST 18 17 23   ALT 17 11 23   ALKPHOS 78 65 90  BILITOT 0.3 0.1* 0.39  PROT 7.3 6.9 7.7  ALBUMIN  --  3.2* 3.9  CBC:  Recent Labs  12/25/12 2046  12/27/12 0800 06/15/13 0804 10/06/13 1016  WBC 6.1  < >  23.2* 6.7 6.4  NEUTROABS 3.9  --   --  4.4 4.6  HGB 11.6*  < > 12.4* 12.7* 11.9*  HCT 34.3*  < > 37.5* 36.4* 36.1*  MCV 91.7  < > 91.5 92.2 95.2  PLT 320  < > 379 326 324  < > = values in this interval not displayed. Lipid Panel:  Recent Labs  11/25/12 0919 07/14/13 1123  HDL 48 43  LDLCALC 170* 87  TRIG 125 78  CHOLHDL 5.1* 3.4   Lab Results  Component Value Date   HGBA1C 5.9* 07/14/2013   Assessment/Plan 1. Type II or unspecified type diabetes mellitus with renal manifestations, not stated as uncontrolled -sugar average excellent, cont healthy diet and exercise, asa, statin, metformin (tolerating)--may consider discontinuing next time if hba1c still good due to renal function  2. Insomnia -continues to be a problem off and on for him but not needing medicine  3. Atherosclerosis of native coronary artery of native heart without angina pectoris -cont secondary prevention with bp, lipid, and glucose control  4. Essential hypertension, benign -bp at goal with current therapy, no changes, cont diet and exercise also  5. MGUS (monoclonal gammopathy of unknown significance) -SPEP, UPEP have been stable over many years and cbc wnl  6. Need for zoster vaccination -script given to get this at pharmacy  - zoster vaccine live, PF, (ZOSTAVAX) 29562 UNT/0.65ML injection; Inject 19,400 Units into the skin once.  Dispense: 1 each; Refill: 0   Labs/tests ordered:   Orders Placed This Encounter  Procedures  . Hemoglobin A1c    Standing Status: Future     Number of Occurrences:      Standing Expiration Date: 06/18/2014  . Basic metabolic panel    Standing Status: Future     Number of Occurrences:      Standing Expiration Date:  06/18/2014    Next appt:  3 mos, labs before

## 2013-10-17 NOTE — Patient Instructions (Signed)
Please bring copy of living will next time

## 2013-10-18 ENCOUNTER — Ambulatory Visit (HOSPITAL_BASED_OUTPATIENT_CLINIC_OR_DEPARTMENT_OTHER): Payer: Medicare Other | Admitting: Oncology

## 2013-10-18 VITALS — BP 158/73 | HR 63 | Temp 97.3°F | Resp 18 | Ht 68.0 in | Wt 185.1 lb

## 2013-10-18 DIAGNOSIS — N189 Chronic kidney disease, unspecified: Secondary | ICD-10-CM

## 2013-10-18 DIAGNOSIS — D472 Monoclonal gammopathy: Secondary | ICD-10-CM

## 2013-10-18 NOTE — Progress Notes (Signed)
Hematology and Oncology Follow Up Visit  Kevin Schwartz [id number] 06-27-1930 80 y.o. 10/18/2013 4:15 PM Schwartz, Kevin, DOReed, Kevin L, DO   Principle Diagnosis: 80 year old with IgG monoclonal gammopathy of undetermined significance dating back to February 2002 the IgG level was 3880.   Current therapy: Observation and follow up.   Interim History: Kevin Schwartz presents today for a follow up visit. Since his last visit, he reports no complications. He has not reported any hospitalization or illnesses. Has not reported any pathological fractures. He does have mild knee pain which have been chronic in nature. He has not reported any constitutional symptoms of weight loss or appetite changes. Is not reporting any chest pain shortness of breath or hemoptysis. At that report any nausea or vomiting or abdominal pain. Does not report any hematuria or dysuria. Mr. review of systems unremarkable.   Medications: I have reviewed the patient's current medications.  Current Outpatient Prescriptions  Medication Sig Dispense Refill  . allopurinol (ZYLOPRIM) 100 MG tablet TAKE 1 TABLET BY MOUTH EVERY DAY  30 tablet  0  . amLODipine (NORVASC) 2.5 MG tablet TAKE 1 TABLET BY MOUTH ONCE DAILY FOR BLOOD PRESSURE  90 tablet  0  . aspirin 81 MG tablet Take 81 mg by mouth daily.      [name] atorvastatin (LIPITOR) 40 MG tablet Take 1 tablet (40 mg total) by mouth daily.  30 tablet  6  . citalopram (CELEXA) 20 MG tablet Take 1 tablet (20 mg total) by mouth daily.  30 tablet  6  . metFORMIN (GLUCOPHAGE) 500 MG tablet Take 1 tablet (500 mg total) by mouth daily with breakfast.  30 tablet  6  . metoprolol (LOPRESSOR) 50 MG tablet Take 1 tablet (50 mg total) by mouth 2 (two) times daily.  60 tablet  6  . nitroGLYCERIN (NITROSTAT) 0.4 MG SL tablet Place 0.4 mg under the tongue every 5 (five) minutes as needed for chest pain. Up to 3 doses      . TDaP (BOOSTRIX) 5-2.5-18.5 LF-MCG/0.5 injection Inject 0.5 mLs into the muscle  once.  0.5 mL  0  . zoster vaccine live, PF, (ZOSTAVAX) 81829 UNT/0.65ML injection Inject 19,400 Units into the skin once.  1 each  0   No current facility-administered medications for this visit.     Allergies:  Allergies  Allergen Reactions  . Ace Inhibitors Swelling    angioedema  . Losartan Potassium Swelling    Angioedema   . Crestor [Rosuvastatin] Swelling    Past Medical History, Surgical history, Social history, and Family History were reviewed and updated.   Physical Exam: Blood pressure 158/73, pulse 63, temperature 97.3 F (36.3 C), temperature source Oral, resp. rate 18, height _0  (1.727 m), weight 185 lb 1.6 oz (83.961 kg). ECOG: 0 General appearance: alert and cooperative Head: Normocephalic, without obvious abnormality Neck: no adenopathy Lymph nodes: Cervical, supraclavicular, and axillary nodes normal. Heart:regular rate and rhythm, S1, S2 normal, no murmur, click, rub or gallop Lung:chest clear, no wheezing, rales, normal symmetric air entry Abdomin: soft, non-tender, without masses or organomegaly EXT:no erythema, induration, or nodules   Lab Results: Lab Results  Component Value Date   WBC 6.4 10/06/2013   HGB 11.9* 10/06/2013   HCT 36.1* 10/06/2013   MCV 95.2 10/06/2013   PLT 324 10/06/2013     Chemistry      Component Value Date/Time   NA 140 10/06/2013 1016   NA 143 07/14/2013 1123   NA 143 06/15/2013  0804   K 4.5 10/06/2013 1016   K 4.0 07/14/2013 1123   CL 104 07/14/2013 1123   CL 108* 07/23/2012 1031   CO2 23 10/06/2013 1016   CO2 21 07/14/2013 1123   BUN 17.2 10/06/2013 1016   BUN 21 07/14/2013 1123   BUN 31* 06/15/2013 0804   CREATININE 1.40* 10/11/2013 1115   CREATININE 1.4* 10/06/2013 1016   CREATININE 1.67* 07/14/2013 1123      Component Value Date/Time   CALCIUM 8.5 10/06/2013 1016   CALCIUM 9.8 07/14/2013 1123   ALKPHOS 90 10/06/2013 1016   ALKPHOS 65 12/26/2012 0515   AST 23 10/06/2013 1016   AST 17 12/26/2012 0515   ALT 23 10/06/2013 1016   ALT  11 12/26/2012 0515   BILITOT 0.39 10/06/2013 1016   BILITOT 0.1* 12/26/2012 0515      Results for Kevin Schwartz (MRN [id number]) as of 10/18/2013 15:24  Ref. Range 10/06/2013 10:16  IgG (Immunoglobin G), Serum Latest Range: [phone] mg/dL 1450  Kappa free light chain Latest Range: 0.33-1.94 mg/dL 2.93 (H)  Lambda Free Lght Chn Latest Range: 0.57-2.63 mg/dL 3.01 (H)  Kappa:Lambda Ratio Latest Range: 0.26-1.65  0.97    Impression and Plan:   80 year old gentleman with the following issues  1. IgG kappa MGUS diagnosed in 2002. His protein studies were reviewed today compared to previous years. He continues to have very little evidence to suggest progression of disease. There is no clear-cut evidence of any end organ damage. His risk of evolving into multiple myeloma was discussed again of approximately 1% per year. At this time, his not quite sure why he needs to continue to followup with oncology and I told him that we'll be happy to see him in the future as needed. I do recommend monitoring his CBC and chemistries as a part of his health physical there is any discrepancies in the future we'll be happy to reevaluate him.  2. Chronic renal insufficiency: His creatinine is baseline and have not changed dramatically. I doubt a plasma cell disorder is contributing.  Kevin Button, MD 7/7/20154:15 PM

## 2013-10-25 ENCOUNTER — Other Ambulatory Visit: Payer: Self-pay | Admitting: Internal Medicine

## 2013-10-26 ENCOUNTER — Other Ambulatory Visit: Payer: Self-pay | Admitting: Internal Medicine

## 2013-10-27 ENCOUNTER — Other Ambulatory Visit (HOSPITAL_COMMUNITY): Payer: Self-pay | Admitting: Anesthesiology

## 2013-10-28 ENCOUNTER — Encounter: Payer: Self-pay | Admitting: Podiatrist

## 2013-10-28 ENCOUNTER — Ambulatory Visit (INDEPENDENT_AMBULATORY_CARE_PROVIDER_SITE_OTHER): Payer: Medicare Other | Admitting: Podiatrist

## 2013-10-28 DIAGNOSIS — M79676 Pain in unspecified toe(s): Secondary | ICD-10-CM

## 2013-10-28 DIAGNOSIS — B351 Tinea unguium: Secondary | ICD-10-CM

## 2013-10-28 DIAGNOSIS — M79609 Pain in unspecified limb: Secondary | ICD-10-CM

## 2013-10-28 NOTE — Progress Notes (Signed)
HPI: Patient presents today for follow up of foot and nail care. Denies any new complaints today.  Objective: Patients chart is reviewed. Neurovascular status unchanged with palpable pedal pulses present. Patients nails are thickened, discolored, distrophic, friable and brittle with yellow-brown discoloration. Patient subjectively relates they are painful with shoes and with ambulation.both feet digits 1-5 rignt, 2-5 left. Contracture deformity all digits noted  Assessment: Symptomatic onychomycosis  Plan: Discussed treatment options and alternatives. The symptomatic toenails were debrided through manual an mechanical means without complication. Return appointment recommended at routine intervals of 3 months

## 2013-12-06 ENCOUNTER — Other Ambulatory Visit (HOSPITAL_COMMUNITY): Payer: Self-pay | Admitting: Anesthesiology

## 2013-12-06 DIAGNOSIS — I358 Other nonrheumatic aortic valve disorders: Secondary | ICD-10-CM

## 2013-12-12 ENCOUNTER — Other Ambulatory Visit: Payer: Self-pay | Admitting: Internal Medicine

## 2014-01-13 ENCOUNTER — Other Ambulatory Visit: Payer: Self-pay | Admitting: Internal Medicine

## 2014-02-03 ENCOUNTER — Ambulatory Visit: Payer: Medicare Other | Admitting: Podiatrist

## 2014-02-08 ENCOUNTER — Ambulatory Visit (INDEPENDENT_AMBULATORY_CARE_PROVIDER_SITE_OTHER): Payer: Medicare Other | Admitting: Podiatrist

## 2014-02-08 ENCOUNTER — Encounter: Payer: Self-pay | Admitting: Podiatrist

## 2014-02-08 DIAGNOSIS — B351 Tinea unguium: Secondary | ICD-10-CM

## 2014-02-08 DIAGNOSIS — M79676 Pain in unspecified toe(s): Secondary | ICD-10-CM

## 2014-02-11 ENCOUNTER — Other Ambulatory Visit: Payer: Self-pay | Admitting: Internal Medicine

## 2014-02-13 NOTE — Progress Notes (Signed)
HPI: Patient presents today for follow up of foot and nail care. Denies any new complaints today.  Objective: Patients chart is reviewed. Neurovascular status unchanged with palpable pedal pulses present. Patients nails are thickened, discolored, distrophic, friable and brittle with yellow-brown discoloration. Patient subjectively relates they are painful with shoes and with ambulation.both feet digits 1-5 rignt, 2-5 left. Contracture deformity all digits noted  Assessment: Symptomatic onychomycosis  Plan: Discussed treatment options and alternatives. The symptomatic toenails were debrided through manual an mechanical means without complication. Return appointment recommended at routine intervals of 3 months

## 2014-02-16 ENCOUNTER — Other Ambulatory Visit: Payer: Medicare Other

## 2014-02-16 DIAGNOSIS — I10 Essential (primary) hypertension: Secondary | ICD-10-CM

## 2014-02-16 DIAGNOSIS — E1129 Type 2 diabetes mellitus with other diabetic kidney complication: Secondary | ICD-10-CM

## 2014-02-17 LAB — BASIC METABOLIC PANEL
BUN/Creatinine Ratio: 16 (ref 10–22)
BUN: 25 mg/dL (ref 8–27)
CO2: 23 mmol/L (ref 18–29)
Calcium: 9.7 mg/dL (ref 8.6–10.2)
Chloride: 101 mmol/L (ref 97–108)
Creatinine, Ser: 1.57 mg/dL — ABNORMAL HIGH (ref 0.76–1.27)
GFR calc Af Amer: 47 mL/min/{1.73_m2} — ABNORMAL LOW (ref >59)
GFR calc non Af Amer: 41 mL/min/{1.73_m2} — ABNORMAL LOW (ref >59)
Glucose: 133 mg/dL — ABNORMAL HIGH (ref 65–99)
Potassium: 4.2 mmol/L (ref 3.5–5.2)
Sodium: 139 mmol/L (ref 134–144)

## 2014-02-17 LAB — HEMOGLOBIN A1C
Est. average glucose Bld gHb Est-mCnc: 131 mg/dL
Hgb A1c MFr Bld: 6.2 % — ABNORMAL HIGH (ref 4.8–5.6)

## 2014-02-20 ENCOUNTER — Ambulatory Visit: Payer: Medicare Other | Admitting: Internal Medicine

## 2014-02-20 ENCOUNTER — Telehealth: Payer: Self-pay | Admitting: *Deleted

## 2014-02-20 NOTE — Telephone Encounter (Signed)
Called pt. And spoke with him and explained labs, pt. Understood fully.

## 2014-02-20 NOTE — Telephone Encounter (Signed)
-----   Message from Gayland Curry, DO sent at 02/18/2014 12:32 PM EST ----- His sugar average has gone up just a little bit.  It is still satisfactory for 80 years old.  It also looks like he was not well hydrated when he had his labs done.

## 2014-02-22 NOTE — Telephone Encounter (Signed)
Discussed with patient on 11/9, patient verbalized understanding of results.

## 2014-03-06 ENCOUNTER — Encounter: Payer: Self-pay | Admitting: Internal Medicine

## 2014-03-06 ENCOUNTER — Ambulatory Visit (INDEPENDENT_AMBULATORY_CARE_PROVIDER_SITE_OTHER): Payer: Medicare Other | Admitting: Internal Medicine

## 2014-03-06 ENCOUNTER — Ambulatory Visit (INDEPENDENT_AMBULATORY_CARE_PROVIDER_SITE_OTHER): Payer: Medicare Other

## 2014-03-06 VITALS — BP 122/70 | HR 71 | Temp 97.8°F | Resp 12 | Ht 68.0 in

## 2014-03-06 DIAGNOSIS — Z23 Encounter for immunization: Secondary | ICD-10-CM

## 2014-03-06 DIAGNOSIS — G4700 Insomnia, unspecified: Secondary | ICD-10-CM

## 2014-03-06 DIAGNOSIS — E1129 Type 2 diabetes mellitus with other diabetic kidney complication: Secondary | ICD-10-CM

## 2014-03-06 DIAGNOSIS — N183 Chronic kidney disease, stage 3 unspecified: Secondary | ICD-10-CM

## 2014-03-06 DIAGNOSIS — I2510 Atherosclerotic heart disease of native coronary artery without angina pectoris: Secondary | ICD-10-CM

## 2014-03-06 DIAGNOSIS — N058 Unspecified nephritic syndrome with other morphologic changes: Secondary | ICD-10-CM

## 2014-03-06 DIAGNOSIS — D472 Monoclonal gammopathy: Secondary | ICD-10-CM

## 2014-03-06 DIAGNOSIS — I10 Essential (primary) hypertension: Secondary | ICD-10-CM

## 2014-03-06 MED ORDER — METFORMIN HCL 500 MG PO TABS: ORAL_TABLET | ORAL | Status: DC

## 2014-03-06 NOTE — Progress Notes (Signed)
Patient ID: Kevin Schwartz, male   DOB: 1930-10-11, 80 y.o.   MRN: [id number]   Location:  [facility]  Allergies  Allergen Reactions  . Ace Inhibitors Swelling    angioedema  . Losartan Potassium Swelling    Angioedema   . Crestor [Rosuvastatin] Swelling    Chief Complaint  Patient presents with  . Medical Management of Chronic Issues    3 month follow-up, labs completed on 02/16/2014 (copy printed)     HPI: Patient is a 80 y.o. black male seen in the office today for medical mgt of chronic diseases.   Sugar average up to 6.2 from 5.8.   No concerns.   Still has to pay over $200 stg for zostavax even if gets at pharmacy so refusing. Got flu shot here today.    Review of Systems:  Review of Systems  Constitutional: Negative for fever and malaise/fatigue.  HENT: Negative for congestion and hearing loss.   Eyes: Negative for blurred vision.       Annual eye exam due jan  Respiratory: Negative for shortness of breath.   Cardiovascular: Negative for chest pain and leg swelling.  Gastrointestinal: Negative for abdominal pain, constipation, blood in stool and melena.       Appetite is good  Genitourinary: Negative for dysuria, urgency and frequency.       1-2x nocturia  Musculoskeletal: Positive for joint pain.       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  Neurological: Negative for dizziness, loss of consciousness and weakness.  Endo/Heme/Allergies: Does not bruise/bleed easily.  Psychiatric/Behavioral: Negative for depression and memory loss. The patient does not have insomnia.        Sleeps well if drinks a cup of hot tea with lemon     Past Medical History  Diagnosis Date  . Renal insufficiency   . CAD (coronary artery disease)     PTCA of OM in 1998, BMS OM in 2000, Cath 9/07 LM ok LAD ok. LCX 95% in OM prior to previous stent RCA. nondominant normal EF normal. Cypher DES to OM 2007 (PLACED PROXIAMAL TO  PREVIOUS STENT)  . DM type 2 (diabetes mellitus, type 2)   . HLD (hyperlipidemia)   . PVD (peripheral vascular disease)   . HTN (hypertension)   . MGUS (monoclonal gammopathy of unknown significance) 08/01/2011    IgG kappa dx 2002 8% plasma cells in bone marrow; no lesions on bone X-rays;  . S/P coronary artery stent placement 08/01/2011  . 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 Peripheral vascular disease, unspecified   . Personality change due to conditions classified elsewhere   . Loss of weight   . Depression   . Urinary tract infection, site not specified   . Memory loss   . Chronic kidney disease, stage III (moderate)   . Chronic kidney disease, unspecified   . Spasm of muscle   . Unspecified disorder of kidney and ureter   . Thyroid disease   . Unspecified hearing loss   . Other atopic dermatitis and related conditions   . Monoclonal paraproteinemia   . Osteoarthrosis, unspecified whether generalized or localized, unspecified site   . Gout, unspecified   . Coronary atherosclerosis of unspecified type of vessel, native or graft     Past Surgical History  Procedure Laterality Date  . Carotid stent    . Coronary angioplasty    . Knee arthroplasty  total  . Joint replacement      left knee    Social History:   reports that he quit smoking about 11 years ago. He has never used smokeless tobacco. He reports that he does not drink alcohol or use illicit drugs.  Family History  Problem Relation Age of Onset  . Diabetes    . Coronary artery disease    . Cancer    . Heart disease Mother   . Heart disease Sister   . Heart disease Sister   . Cancer Sister   . Hypertension Sister   . Diabetes Brother   . Diabetes Brother     Medications: Patient's Medications  New Prescriptions   No medications on file  Previous Medications   ALLOPURINOL (ZYLOPRIM) 100 MG TABLET    TAKE 1 TABLET BY MOUTH EVERY DAY   AMLODIPINE (NORVASC) 2.5 MG TABLET    TAKE 1 TABLET BY  MOUTH ONCE DAILY FOR BLOOD PRESSURE   ASPIRIN 81 MG TABLET    Take 81 mg by mouth daily.   ATORVASTATIN (LIPITOR) 40 MG TABLET    Take 1 tablet (40 mg total) by mouth daily.   CITALOPRAM (CELEXA) 20 MG TABLET    TAKE 1 TABLET BY MOUTH EVERY DAY   METFORMIN (GLUCOPHAGE) 500 MG TABLET    TAKE 1 TABLET BY MOUTH EVERY DAY WITH BREAKFAST   METOPROLOL (LOPRESSOR) 50 MG TABLET    TAKE 1 TABLET BY MOUTH TWICE DAILY   NITROGLYCERIN (NITROSTAT) 0.4 MG SL TABLET    Place 0.4 mg under the tongue every 5 (five) minutes as needed for chest pain. Up to 3 doses   TDAP (BOOSTRIX) 5-2.5-18.5 LF-MCG/0.5 INJECTION    Inject 0.5 mLs into the muscle once.  Modified Medications   No medications on file  Discontinued Medications   ZOSTER VACCINE LIVE, PF, (ZOSTAVAX) 91478 UNT/0.65ML INJECTION    Inject 19,400 Units into the skin once.     Physical Exam: Filed Vitals:   03/06/14 1153  BP: 122/70  Pulse: 71  Temp: 97.8 F (36.6 C)  TempSrc: Oral  Resp: 12  Height: 5\' 8"  (1.727 m)  SpO2: 96%  Physical Exam  Constitutional: He is oriented to person, place, and time. He appears well-developed and well-nourished. No distress.  HENT:  Head: Normocephalic and atraumatic.  Cardiovascular: Normal rate, regular rhythm, normal heart sounds and intact distal pulses.   Pulmonary/Chest: Breath sounds normal. No respiratory distress.  Abdominal: Soft. Bowel sounds are normal. He exhibits no distension and no mass. There is no tenderness.  Musculoskeletal: Normal range of motion. He exhibits no edema or tenderness.  Neurological: He is alert and oriented to person, place, and time.  Skin: Skin is warm and dry.  Psychiatric: He has a normal mood and affect.    Labs reviewed: Basic Metabolic Panel:  Recent Labs  06/15/13 0804 07/14/13 1123 10/06/13 1016 10/11/13 1115 02/16/14 1055  NA 143 143 140  --  139  K 4.6 4.0 4.5  --  4.2  CL 106 104  --   --  101  CO2 22 21 23   --  23  GLUCOSE 87 97 97  --  133*    BUN 31* 21 17.2  --  25  CREATININE 1.79* 1.67* 1.4* 1.40* 1.57*  CALCIUM 9.5 9.8 8.5  --  9.7   Liver Function Tests:  Recent Labs  10/06/13 1016  AST 23  ALT 23  ALKPHOS 90  BILITOT 0.39  PROT 7.7  ALBUMIN 3.9   No results for input(s): LIPASE, AMYLASE in the last 8760 hours. No results for input(s): AMMONIA in the last 8760 hours. CBC:  Recent Labs  06/15/13 0804 10/06/13 1016  WBC 6.7 6.4  NEUTROABS 4.4 4.6  HGB 12.7* 11.9*  HCT 36.4* 36.1*  MCV 92.2 95.2  PLT 326 324   Lipid Panel:  Recent Labs  07/14/13 1123  HDL 43  LDLCALC 87  TRIG 78  CHOLHDL 3.4   Lab Results  Component Value Date   HGBA1C 6.2* 02/16/2014    Assessment/Plan 1. Insomnia -improved with tea drinking  2. Chronic kidney disease, stage III (moderate) -has been stable -due to dm -cannot take ace/arb due to allergy - Basic metabolic panel; Future  3. Type 2 diabetes mellitus, controlled, with renal complications - stable with metformin--renal function is borderline, but pt has not had problems with this medication so prefer not to "rock the boat" - Microalbumin/Creatinine Ratio, Urine - Basic metabolic panel; Future - Hemoglobin A1c; Future  4. Atherosclerosis of native coronary artery of native heart without angina pectoris - stable, no symptoms, cont secondary prevention - CBC With differential/Platelet; Future - Lipid panel; Future  5. Essential hypertension, benign -bp at goal with current therapy  6. MGUS (monoclonal gammopathy of unknown significance) -was just rechecked and has not progressed - CBC With differential/Platelet; Future   Labs/tests ordered: Orders Placed This Encounter  Procedures  . Microalbumin/Creatinine Ratio, Urine  . CBC With differential/Platelet    Standing Status: Future     Number of Occurrences:      Standing Expiration Date: 09/04/2014  . Basic metabolic panel    Standing Status: Future     Number of Occurrences:      Standing  Expiration Date: 09/04/2014    Order Specific Question:  Has the patient fasted?    Answer:  Yes  . Hemoglobin A1c    Standing Status: Future     Number of Occurrences:      Standing Expiration Date: 09/04/2014  . Lipid panel    Standing Status: Future     Number of Occurrences:      Standing Expiration Date: 09/04/2014    Order Specific Question:  Has the patient fasted?    Answer:  Yes    Next appt:  4 mos with labs before Needs prevnar next time.  Ragan Reale L. Jla Reynolds, D.O. [facility] [address], [place] [address] Cell Phone (Mon-Fri 8am-5pm):  [phone] On Call:  [phone] & follow prompts after 5pm & weekends Office Phone:  [phone] Office Fax:  [phone]

## 2014-03-07 LAB — MICROALBUMIN / CREATININE URINE RATIO
Creatinine, Ur: 156.3 mg/dL (ref 22.0–328.0)
MICROALB/CREAT RATIO: 77.4 mg/g creat — ABNORMAL HIGH (ref 0.0–30.0)
Microalbumin, Urine: 120.9 ug/mL — ABNORMAL HIGH (ref 0.0–17.0)

## 2014-03-08 ENCOUNTER — Encounter: Payer: Self-pay | Admitting: *Deleted

## 2014-03-15 ENCOUNTER — Other Ambulatory Visit: Payer: Self-pay | Admitting: Internal Medicine

## 2014-04-17 ENCOUNTER — Other Ambulatory Visit: Payer: Self-pay | Admitting: Internal Medicine

## 2014-05-11 ENCOUNTER — Ambulatory Visit: Payer: Medicare Other | Admitting: Podiatrist

## 2014-05-12 ENCOUNTER — Ambulatory Visit: Payer: Medicare Other | Admitting: Podiatrist

## 2014-05-20 ENCOUNTER — Other Ambulatory Visit (HOSPITAL_COMMUNITY): Payer: Self-pay | Admitting: Internal Medicine

## 2014-05-25 ENCOUNTER — Ambulatory Visit: Payer: Self-pay | Admitting: Podiatrist

## 2014-05-29 ENCOUNTER — Telehealth: Payer: Self-pay | Admitting: *Deleted

## 2014-05-29 NOTE — Telephone Encounter (Signed)
Filled out [employer] Form and placed for Dr. Mariea Clonts to review and sign. Faxed back to # [phone]

## 2014-06-08 DIAGNOSIS — D472 Monoclonal gammopathy: Secondary | ICD-10-CM | POA: Diagnosis not present

## 2014-06-08 DIAGNOSIS — N183 Chronic kidney disease, stage 3 (moderate): Secondary | ICD-10-CM | POA: Diagnosis not present

## 2014-06-08 DIAGNOSIS — I150 Renovascular hypertension: Secondary | ICD-10-CM | POA: Diagnosis not present

## 2014-06-08 DIAGNOSIS — E1121 Type 2 diabetes mellitus with diabetic nephropathy: Secondary | ICD-10-CM | POA: Diagnosis not present

## 2014-06-09 ENCOUNTER — Other Ambulatory Visit: Payer: Medicare Other

## 2014-06-09 DIAGNOSIS — N058 Unspecified nephritic syndrome with other morphologic changes: Secondary | ICD-10-CM | POA: Diagnosis not present

## 2014-06-09 DIAGNOSIS — D472 Monoclonal gammopathy: Secondary | ICD-10-CM

## 2014-06-09 DIAGNOSIS — N183 Chronic kidney disease, stage 3 unspecified: Secondary | ICD-10-CM

## 2014-06-09 DIAGNOSIS — I2510 Atherosclerotic heart disease of native coronary artery without angina pectoris: Secondary | ICD-10-CM

## 2014-06-09 DIAGNOSIS — E1129 Type 2 diabetes mellitus with other diabetic kidney complication: Secondary | ICD-10-CM

## 2014-06-10 LAB — CBC WITH DIFFERENTIAL
Basophils Absolute: 0 10*3/uL (ref 0.0–0.2)
Basos: 0 %
Eos: 5 %
Eosinophils Absolute: 0.4 10*3/uL (ref 0.0–0.4)
HCT: 38.4 % (ref 37.5–51.0)
Hemoglobin: 13 g/dL (ref 12.6–17.7)
Immature Grans (Abs): 0 10*3/uL (ref 0.0–0.1)
Immature Granulocytes: 0 %
Lymphocytes Absolute: 1.8 10*3/uL (ref 0.7–3.1)
Lymphs: 24 %
MCH: 30.9 pg (ref 26.6–33.0)
MCHC: 33.9 g/dL (ref 31.5–35.7)
MCV: 91 fL (ref 79–97)
Monocytes Absolute: 0.6 10*3/uL (ref 0.1–0.9)
Monocytes: 7 %
Neutrophils Absolute: 4.7 10*3/uL (ref 1.4–7.0)
Neutrophils Relative %: 64 %
RBC: 4.21 x10E6/uL (ref 4.14–5.80)
RDW: 14.5 % (ref 12.3–15.4)
WBC: 7.4 10*3/uL (ref 3.4–10.8)

## 2014-06-10 LAB — LIPID PANEL
Chol/HDL Ratio: 3.2 ratio units (ref 0.0–5.0)
Cholesterol, Total: 172 mg/dL (ref 100–199)
HDL: 53 mg/dL (ref >39)
LDL Calculated: 97 mg/dL (ref 0–99)
Triglycerides: 111 mg/dL (ref 0–149)
VLDL Cholesterol Cal: 22 mg/dL (ref 5–40)

## 2014-06-10 LAB — BASIC METABOLIC PANEL
BUN/Creatinine Ratio: 19 (ref 10–22)
BUN: 30 mg/dL — ABNORMAL HIGH (ref 8–27)
CO2: 20 mmol/L (ref 18–29)
Calcium: 10.1 mg/dL (ref 8.6–10.2)
Chloride: 100 mmol/L (ref 97–108)
Creatinine, Ser: 1.55 mg/dL — ABNORMAL HIGH (ref 0.76–1.27)
GFR calc Af Amer: 48 mL/min/{1.73_m2} — ABNORMAL LOW (ref >59)
GFR calc non Af Amer: 41 mL/min/{1.73_m2} — ABNORMAL LOW (ref >59)
Glucose: 115 mg/dL — ABNORMAL HIGH (ref 65–99)
Potassium: 4.9 mmol/L (ref 3.5–5.2)
Sodium: 141 mmol/L (ref 134–144)

## 2014-06-10 LAB — HEMOGLOBIN A1C
Est. average glucose Bld gHb Est-mCnc: 123 mg/dL
Hgb A1c MFr Bld: 5.9 % — ABNORMAL HIGH (ref 4.8–5.6)

## 2014-06-12 ENCOUNTER — Encounter: Payer: Self-pay | Admitting: Internal Medicine

## 2014-07-10 ENCOUNTER — Ambulatory Visit: Payer: Medicare Other | Admitting: Internal Medicine

## 2014-07-13 ENCOUNTER — Other Ambulatory Visit: Payer: Self-pay | Admitting: Nurse Practitioner

## 2014-07-14 ENCOUNTER — Other Ambulatory Visit: Payer: Self-pay | Admitting: Nurse Practitioner

## 2014-07-19 ENCOUNTER — Other Ambulatory Visit: Payer: Self-pay | Admitting: Nurse Practitioner

## 2014-08-17 ENCOUNTER — Ambulatory Visit: Payer: Medicare Other | Admitting: Internal Medicine

## 2014-08-21 ENCOUNTER — Other Ambulatory Visit: Payer: Self-pay | Admitting: Internal Medicine

## 2014-09-11 ENCOUNTER — Other Ambulatory Visit: Payer: Self-pay | Admitting: Internal Medicine

## 2014-09-21 ENCOUNTER — Ambulatory Visit: Payer: Medicare Other | Admitting: Internal Medicine

## 2014-09-21 ENCOUNTER — Encounter: Payer: Self-pay | Admitting: Internal Medicine

## 2014-09-22 ENCOUNTER — Encounter: Payer: Self-pay | Admitting: Internal Medicine

## 2014-09-22 ENCOUNTER — Ambulatory Visit (INDEPENDENT_AMBULATORY_CARE_PROVIDER_SITE_OTHER): Payer: Medicare Other | Admitting: Internal Medicine

## 2014-09-22 VITALS — BP 138/70 | HR 83 | Temp 98.1°F | Resp 20 | Ht 68.0 in | Wt 185.8 lb

## 2014-09-22 DIAGNOSIS — E118 Type 2 diabetes mellitus with unspecified complications: Secondary | ICD-10-CM | POA: Diagnosis not present

## 2014-09-22 DIAGNOSIS — N183 Chronic kidney disease, stage 3 unspecified: Secondary | ICD-10-CM

## 2014-09-22 DIAGNOSIS — N058 Unspecified nephritic syndrome with other morphologic changes: Secondary | ICD-10-CM

## 2014-09-22 DIAGNOSIS — E1129 Type 2 diabetes mellitus with other diabetic kidney complication: Secondary | ICD-10-CM

## 2014-09-22 DIAGNOSIS — Z23 Encounter for immunization: Secondary | ICD-10-CM | POA: Diagnosis not present

## 2014-09-22 DIAGNOSIS — E785 Hyperlipidemia, unspecified: Secondary | ICD-10-CM

## 2014-09-22 DIAGNOSIS — I2510 Atherosclerotic heart disease of native coronary artery without angina pectoris: Secondary | ICD-10-CM

## 2014-09-22 DIAGNOSIS — D472 Monoclonal gammopathy: Secondary | ICD-10-CM | POA: Diagnosis not present

## 2014-09-22 DIAGNOSIS — I10 Essential (primary) hypertension: Secondary | ICD-10-CM

## 2014-09-22 MED ORDER — CITALOPRAM HYDROBROMIDE 20 MG PO TABS: 20.0000 mg | ORAL_TABLET | Freq: Every day | ORAL | Status: DC

## 2014-09-22 MED ORDER — METFORMIN HCL 500 MG PO TABS: ORAL_TABLET | ORAL | Status: DC

## 2014-09-22 MED ORDER — AMLODIPINE BESYLATE 2.5 MG PO TABS: 5.0000 mg | ORAL_TABLET | Freq: Every day | ORAL | Status: DC

## 2014-09-22 MED ORDER — ASPIRIN 81 MG PO TABS: 81.0000 mg | ORAL_TABLET | Freq: Every day | ORAL | Status: DC

## 2014-09-22 MED ORDER — ATORVASTATIN CALCIUM 40 MG PO TABS: 40.0000 mg | ORAL_TABLET | Freq: Every day | ORAL | Status: DC

## 2014-09-22 MED ORDER — ALLOPURINOL 100 MG PO TABS
100.0000 mg | ORAL_TABLET | Freq: Every day | ORAL | Status: DC
Start: 2014-09-22 — End: 2015-05-18

## 2014-09-22 NOTE — Progress Notes (Signed)
Patient ID: Kevin Schwartz, male   DOB: [birth year], 81 y.o.   MRN: [id number]   Location:  [facility]  Code Status: DNR Goals of Care: Advanced Directive information Does patient have an advance directive?: Yes, Type of Advance Directive: Living will, Does patient want to make changes to advanced directive?: No - Patient declined   Chief Complaint  Patient presents with  . Medical Management of Chronic Issues    HPI: Patient is a 81 y.o. black male seen in the office today for med mgt of chronic diseases.    Nothing new.   No concerns.   Is happy to be here.   Vision doing well.  Wearing different glasses b/c he broke the old ones.  Dr. Rande Brunt on [place].   Sugars are doing fine.  Eating healthy diet.  Gets some exercise early in am and late in evening working in the garden.   Sleeping better now.  Mood is as good as it can be.   No falls.  Balance is doing fine.   No gout flares. No chest pain or breathing problems. Bowels are moving well daily--no hematochezia or melena. Saw Dr. Alen Blew in July 2015 re: MGUS.  Review of Systems:  Review of Systems  Constitutional: Negative for fever, chills and malaise/fatigue.  HENT: Negative for congestion and hearing loss.   Eyes: Negative for blurred vision.       Glasses  Respiratory: Negative for cough and shortness of breath.   Cardiovascular: Negative for chest pain and leg swelling.  Gastrointestinal: Negative for abdominal pain, constipation, blood in stool and melena.  Genitourinary: Negative for dysuria, urgency and frequency.  Musculoskeletal: Positive for joint pain. Negative for falls.  Skin: Negative for rash.  Neurological: Negative for dizziness, loss of consciousness, weakness and headaches.  Endo/Heme/Allergies: Does not bruise/bleed easily.  Psychiatric/Behavioral: Positive for depression. Negative for memory loss. The patient has insomnia.        Controlled    Past  Medical History  Diagnosis Date  . Renal insufficiency   . CAD (coronary artery disease)     PTCA of OM in 1998, BMS OM in 2000, Cath 9/07 LM ok LAD ok. LCX 95% in OM prior to previous stent RCA. nondominant normal EF normal. Cypher DES to OM 2007 (PLACED PROXIAMAL TO PREVIOUS STENT)  . DM type 2 (diabetes mellitus, type 2)   . HLD (hyperlipidemia)   . PVD (peripheral vascular disease)   . HTN (hypertension)   . MGUS (monoclonal gammopathy of unknown significance) 08/01/2011    IgG kappa dx 2002 8% plasma cells in bone marrow; no lesions on bone X-rays;  . S/P coronary artery stent placement 08/01/2011  . 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 Peripheral vascular disease, unspecified   . Personality change due to conditions classified elsewhere   . Loss of weight   . Depression   . Urinary tract infection, site not specified   . Memory loss   . Chronic kidney disease, stage III (moderate)   . Chronic kidney disease, unspecified   . Spasm of muscle   . Unspecified disorder of kidney and ureter   . Thyroid disease   . Unspecified hearing loss   . Other atopic dermatitis and related conditions   . Monoclonal paraproteinemia   . Osteoarthrosis, unspecified whether generalized or localized, unspecified site   . Gout, unspecified   . Coronary atherosclerosis of unspecified type of vessel, native  or graft     Past Surgical History  Procedure Laterality Date  . Carotid stent    . Coronary angioplasty    . Knee arthroplasty      total  . Joint replacement      left knee    Allergies  Allergen Reactions  . Ace Inhibitors Swelling    angioedema  . Losartan Potassium Swelling    Angioedema   . Crestor [Rosuvastatin] Swelling   Medications: Patient's Medications  New Prescriptions   No medications on file  Previous Medications   BLOOD GLUCOSE CALIBRATION (OT ULTRA/FASTTK CNTRL SOLN) SOLN       METOPROLOL (LOPRESSOR) 50 MG TABLET    TAKE 1 TABLET BY MOUTH TWICE DAILY    NITROGLYCERIN (NITROSTAT) 0.4 MG SL TABLET    Place 0.4 mg under the tongue every 5 (five) minutes as needed for chest pain. Up to 3 doses   ONE TOUCH ULTRA TEST TEST STRIP       TDAP (BOOSTRIX) 5-2.5-18.5 LF-MCG/0.5 INJECTION    Inject 0.5 mLs into the muscle once.   ULTRA-THIN II MINI PEN NEEDLE 31G X 5 MM MISC      Modified Medications   Modified Medication Previous Medication   ALLOPURINOL (ZYLOPRIM) 100 MG TABLET allopurinol (ZYLOPRIM) 100 MG tablet      Take 1 tablet (100 mg total) by mouth daily.    TAKE 1 TABLET BY MOUTH EVERY DAY.   AMLODIPINE (NORVASC) 2.5 MG TABLET amLODipine (NORVASC) 2.5 MG tablet      Take 2 tablets (5 mg total) by mouth daily.    TAKE 2 TABLETS BY MOUTH EVERY DAY   ASPIRIN 81 MG TABLET aspirin 81 MG tablet      Take 1 tablet (81 mg total) by mouth daily.    Take 81 mg by mouth daily.   ATORVASTATIN (LIPITOR) 40 MG TABLET atorvastatin (LIPITOR) 40 MG tablet      Take 1 tablet (40 mg total) by mouth daily.    Take 1 tablet (40 mg total) by mouth daily.   CITALOPRAM (CELEXA) 20 MG TABLET citalopram (CELEXA) 20 MG tablet      Take 1 tablet (20 mg total) by mouth daily.    TAKE 1 TABLET BY MOUTH EVERY DAY   METFORMIN (GLUCOPHAGE) 500 MG TABLET metFORMIN (GLUCOPHAGE) 500 MG tablet      TAKE 1 TABLET BY MOUTH EVERY DAY WITH BREAKFAST    TAKE 1 TABLET BY MOUTH EVERY DAY WITH BREAKFAST  Discontinued Medications   AMLODIPINE (NORVASC) 2.5 MG TABLET    TAKE 1 TABLET BY MOUTH ONCE DAILY FOR BLOOD PRESSURE   CITALOPRAM (CELEXA) 20 MG TABLET    TAKE 1 TABLET BY MOUTH EVERY DAY    Physical Exam: Filed Vitals:   09/22/14 0833  BP: 138/70  Pulse: 83  Temp: 98.1 F (36.7 C)  TempSrc: Oral  Resp: 20  Height: 5\' 8"  (1.727 m)  Weight: 185 lb 12.8 oz (84.278 kg)  SpO2: 98%   Physical Exam  Constitutional: He is oriented to person, place, and time. He appears well-developed and well-nourished. No distress.  HENT:  Head: Normocephalic and atraumatic.  Eyes:  glasses    Cardiovascular: Normal rate, regular rhythm, normal heart sounds and intact distal pulses.   Pulmonary/Chest: Effort normal and breath sounds normal. No respiratory distress.  Abdominal: Soft. Bowel sounds are normal.  Musculoskeletal: Normal range of motion.  Neurological: He is alert and oriented to person, place, and time.  Skin: Skin  is warm and dry.  Psychiatric: He has a normal mood and affect.    Labs reviewed: Basic Metabolic Panel:  Recent Labs  10/06/13 1016 10/11/13 1115 02/16/14 1055 06/09/14 0903  NA 140  --  139 141  K 4.5  --  4.2 4.9  CL  --   --  101 100  CO2 23  --  23 20  GLUCOSE 97  --  133* 115*  BUN 17.2  --  25 30*  CREATININE 1.4* 1.40* 1.57* 1.55*  CALCIUM 8.5  --  9.7 10.1   Liver Function Tests:  Recent Labs  10/06/13 1016  AST 23  ALT 23  ALKPHOS 90  BILITOT 0.39  PROT 7.7  ALBUMIN 3.9   No results for input(s): LIPASE, AMYLASE in the last 8760 hours. No results for input(s): AMMONIA in the last 8760 hours. CBC:  Recent Labs  10/06/13 1016 06/09/14 0903  WBC 6.4 7.4  NEUTROABS 4.6 4.7  HGB 11.9* 13.0  HCT 36.1* 38.4  MCV 95.2 91  PLT 324  --    Lipid Panel:  Recent Labs  06/09/14 0903  CHOL 172  HDL 53  LDLCALC 97  TRIG 111  CHOLHDL 3.2   Lab Results  Component Value Date   HGBA1C 5.9* 06/09/2014    Assessment/Plan 1. Essential hypertension -bp is at goal with norvasc, lopressor -no difficulty with hypotension, dizziness - amLODipine (NORVASC) 2.5 MG tablet; Take 2 tablets (5 mg total) by mouth daily.  Dispense: 90 tablet; Refill: 1  2. Hyperlipidemia -lipids at goal in February with lipitor - atorvastatin (LIPITOR) 40 MG tablet; Take 1 tablet (40 mg total) by mouth daily.  Dispense: 30 tablet; Refill: 3  3. Type 2 diabetes mellitus with complication -last [id number] in February was 5.9, recheck today - metFORMIN (GLUCOPHAGE) 500 MG tablet; TAKE 1 TABLET BY MOUTH EVERY DAY WITH BREAKFAST  Dispense: 90 tablet;  Refill: 1  4. Chronic kidney disease, stage III (moderate) -cannot tolerate ace/arb and bp at goal, lipids and glucose well controlled, hydrating well, avoiding nsaids -f/u bmp today  5. Atherosclerosis of native coronary artery of native heart without angina pectoris -no symptoms, doing well, risk factors are well controlled  6. MGUS (monoclonal gammopathy of unknown significance) -cbc normal in February, no difficulties to suggest progression to myeloma  7. Need for vaccination with 13-polyvalent pneumococcal conjugate vaccine -prevnar given  Labs/tests ordered: Orders Placed This Encounter  Procedures  . Hemoglobin A1c  . Basic metabolic panel    Next appt:  3 mos   Iosefa Weintraub L. Ladell Lea, D.O. [facility] [address], [place] [address] Cell Phone (Mon-Fri 8am-5pm):  [phone] On Call:  [phone] & follow prompts after 5pm & weekends Office Phone:  [phone] Office Fax:  [phone]

## 2014-09-22 NOTE — Addendum Note (Signed)
Addended by: Elijah Birk L on: 09/22/2014 10:00 AM   Modules accepted: Orders

## 2014-09-23 LAB — BASIC METABOLIC PANEL
BUN/Creatinine Ratio: 9 — ABNORMAL LOW (ref 10–22)
BUN: 12 mg/dL (ref 8–27)
CO2: 21 mmol/L (ref 18–29)
Calcium: 9.3 mg/dL (ref 8.6–10.2)
Chloride: 103 mmol/L (ref 97–108)
Creatinine, Ser: 1.28 mg/dL — ABNORMAL HIGH (ref 0.76–1.27)
GFR calc Af Amer: 60 mL/min/{1.73_m2} (ref >59)
GFR calc non Af Amer: 52 mL/min/{1.73_m2} — ABNORMAL LOW (ref >59)
Glucose: 106 mg/dL — ABNORMAL HIGH (ref 65–99)
Potassium: 3.9 mmol/L (ref 3.5–5.2)
Sodium: 142 mmol/L (ref 134–144)

## 2014-09-23 LAB — HEMOGLOBIN A1C
Est. average glucose Bld gHb Est-mCnc: 117 mg/dL
Hgb A1c MFr Bld: 5.7 % — ABNORMAL HIGH (ref 4.8–5.6)

## 2014-12-14 ENCOUNTER — Encounter: Payer: Self-pay | Admitting: Internal Medicine

## 2014-12-14 ENCOUNTER — Ambulatory Visit (INDEPENDENT_AMBULATORY_CARE_PROVIDER_SITE_OTHER): Payer: Medicare Other | Admitting: Internal Medicine

## 2014-12-14 VITALS — BP 118/86 | HR 110 | Temp 98.1°F | Resp 20 | Ht 68.0 in | Wt 179.2 lb

## 2014-12-14 DIAGNOSIS — N058 Unspecified nephritic syndrome with other morphologic changes: Secondary | ICD-10-CM | POA: Diagnosis not present

## 2014-12-14 DIAGNOSIS — E785 Hyperlipidemia, unspecified: Secondary | ICD-10-CM

## 2014-12-14 DIAGNOSIS — N183 Chronic kidney disease, stage 3 unspecified: Secondary | ICD-10-CM

## 2014-12-14 DIAGNOSIS — E1129 Type 2 diabetes mellitus with other diabetic kidney complication: Secondary | ICD-10-CM

## 2014-12-14 DIAGNOSIS — I10 Essential (primary) hypertension: Secondary | ICD-10-CM | POA: Diagnosis not present

## 2014-12-14 DIAGNOSIS — D472 Monoclonal gammopathy: Secondary | ICD-10-CM | POA: Diagnosis not present

## 2014-12-14 DIAGNOSIS — Z23 Encounter for immunization: Secondary | ICD-10-CM | POA: Diagnosis not present

## 2014-12-14 DIAGNOSIS — I2510 Atherosclerotic heart disease of native coronary artery without angina pectoris: Secondary | ICD-10-CM | POA: Diagnosis not present

## 2014-12-14 MED ORDER — TETANUS-DIPHTH-ACELL PERTUSSIS 5-2.5-18.5 LF-MCG/0.5 IM SUSP: 0.5000 mL | Freq: Once | INTRAMUSCULAR | Status: DC

## 2014-12-14 NOTE — Progress Notes (Signed)
Patient ID: Kevin Schwartz, male   DOB: 1930/10/03, 81 y.o.   MRN: [id number]   Location:  [facility]  Code Status: DNR Goals of Care: Advanced Directive information Does patient have an advance directive?: No, Would patient like information on creating an advanced directive?: Yes - Educational materials given   Chief Complaint  Patient presents with  . Medical Management of Chronic Issues    3 month follow-up for Medical Management    HPI: Patient is a 81 y.o. black male seen in the office today for med mgt of his chronic diseases.  Doing well.  No new concerns.  HTN:  bp well controlled.  Had an episode yesterday morning when he hadn't eaten of lightheadedness, but resolved when laid down.  Otherwise no dizziness.  CAD:  No chest pain, sob.  Tolerating meds.  DMII:  Last hba1c was 5.7.  Had OJ this am and cocoa.    Hyperlipidemia:  Last lipids with LDL 97.    No pain.   No falls.   Mood is good.    He has been sleeping well for the past 3-4 wks.  Has been drinking hot tea and hot chocolate and plain water and that's the only change.     No gout flare-ups.    When checked with insurance, zostavax was too expensive.    No urinary incontinence.   Independent in all ADLs. 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    Review of Systems:  Review of Systems  Constitutional: Negative for fever, chills and malaise/fatigue.  HENT: Positive for hearing loss. Negative for congestion.   Eyes: Negative for blurred vision.       Glasses  Respiratory: Negative for cough and shortness of breath.   Cardiovascular: Negative for chest pain, palpitations and leg swelling.  Gastrointestinal: Negative for abdominal pain, constipation, blood in stool and melena.  Genitourinary: Negative for dysuria, urgency and frequency.  Musculoskeletal: Negative for myalgias, joint pain and  falls.  Skin: Negative for itching and rash.  Neurological: Negative for dizziness, loss of consciousness and headaches.  Endo/Heme/Allergies: Does not bruise/bleed easily.  Psychiatric/Behavioral: Negative for depression and memory loss.    Past Medical History  Diagnosis Date  . Renal insufficiency   . CAD (coronary artery disease)     PTCA of OM in 1998, BMS OM in 2000, Cath 9/07 LM ok LAD ok. LCX 95% in OM prior to previous stent RCA. nondominant normal EF normal. Cypher DES to OM 2007 (PLACED PROXIAMAL TO PREVIOUS STENT)  . DM type 2 (diabetes mellitus, type 2)   . HLD (hyperlipidemia)   . PVD (peripheral vascular disease)   . HTN (hypertension)   . MGUS (monoclonal gammopathy of unknown significance) 08/01/2011    IgG kappa dx 2002 8% plasma cells in bone marrow; no lesions on bone X-rays;  . S/P coronary artery stent placement 08/01/2011  . 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 Peripheral vascular disease, unspecified   . Personality change due to conditions classified elsewhere   . Loss of weight   . Depression   . Urinary tract infection, site not specified   . Memory loss   . Chronic kidney disease, stage III (moderate)   . Chronic kidney disease, unspecified   . Spasm of muscle   . Unspecified disorder of kidney and ureter   . Thyroid disease   .  Unspecified hearing loss   . Other atopic dermatitis and related conditions   . Monoclonal paraproteinemia   . Osteoarthrosis, unspecified whether generalized or localized, unspecified site   . Gout, unspecified   . Coronary atherosclerosis of unspecified type of vessel, native or graft     Past Surgical History  Procedure Laterality Date  . Carotid stent    . Coronary angioplasty    . Knee arthroplasty      total  . Joint replacement      left knee    Allergies  Allergen Reactions  . Ace Inhibitors Swelling    angioedema  . Losartan Potassium Swelling    Angioedema   . Crestor [Rosuvastatin] Swelling    Medications: Patient's Medications  New Prescriptions   No medications on file  Previous Medications   ALLOPURINOL (ZYLOPRIM) 100 MG TABLET    Take 1 tablet (100 mg total) by mouth daily.   AMLODIPINE (NORVASC) 2.5 MG TABLET    Take 2 tablets (5 mg total) by mouth daily.   ASPIRIN 81 MG TABLET    Take 1 tablet (81 mg total) by mouth daily.   ATORVASTATIN (LIPITOR) 40 MG TABLET    Take 1 tablet (40 mg total) by mouth daily.   BLOOD GLUCOSE CALIBRATION (OT ULTRA/FASTTK CNTRL SOLN) SOLN       CITALOPRAM (CELEXA) 20 MG TABLET    Take 1 tablet (20 mg total) by mouth daily.   METFORMIN (GLUCOPHAGE) 500 MG TABLET    TAKE 1 TABLET BY MOUTH EVERY DAY WITH BREAKFAST   METOPROLOL (LOPRESSOR) 50 MG TABLET    TAKE 1 TABLET BY MOUTH TWICE DAILY   NITROGLYCERIN (NITROSTAT) 0.4 MG SL TABLET    Place 0.4 mg under the tongue every 5 (five) minutes as needed for chest pain. Up to 3 doses   ONE TOUCH ULTRA TEST TEST STRIP       TDAP (BOOSTRIX) 5-2.5-18.5 LF-MCG/0.5 INJECTION    Inject 0.5 mLs into the muscle once.   ULTRA-THIN II MINI PEN NEEDLE 31G X 5 MM MISC      Modified Medications   No medications on file  Discontinued Medications   No medications on file    Physical Exam: Filed Vitals:   12/14/14 0954  BP: 118/86  Pulse: 110  Temp: 98.1 F (36.7 C)  TempSrc: Oral  Resp: 20  Height: 5\' 8"  (1.727 m)  Weight: 179 lb 3.2 oz (81.285 kg)  SpO2: 99%   Physical Exam  Constitutional: He is oriented to person, place, and time. He appears well-developed and well-nourished. No distress.  Cardiovascular: Normal rate, regular rhythm, normal heart sounds and intact distal pulses.   Pulmonary/Chest: Effort normal and breath sounds normal. No respiratory distress.  Abdominal: Soft. Bowel sounds are normal. He exhibits no distension. There is no tenderness.  Musculoskeletal: Normal range of motion.  Shuffling gait  Neurological: He is alert and oriented to person, place, and time. No cranial nerve  deficit.  Skin: Skin is warm and dry.  Psychiatric: He has a normal mood and affect.    Labs reviewed: Basic Metabolic Panel:  Recent Labs  02/16/14 1055 06/09/14 0903 09/22/14 0958  NA 139 141 142  K 4.2 4.9 3.9  CL 101 100 103  CO2 23 20 21   GLUCOSE 133* 115* 106*  BUN 25 30* 12  CREATININE 1.57* 1.55* 1.28*  CALCIUM 9.7 10.1 9.3  CBC:  Recent Labs  06/09/14 0903  WBC 7.4  NEUTROABS 4.7  HGB 13.0  HCT 38.4  MCV 91   Lipid Panel:  Recent Labs  06/09/14 0903  CHOL 172  HDL 53  LDLCALC 97  TRIG 111  CHOLHDL 3.2   Lab Results  Component Value Date   HGBA1C 5.7* 09/22/2014   Assessment/Plan 1. Type 2 diabetes mellitus, controlled, with renal complications - doing well on metformin only, diet and exercise - Hemoglobin A1c; Future (in am) -cont baby asa -cannot take ace/arb due to angioedema  2. Chronic kidney disease, stage III (moderate) - cont to avoid nsaids and nephrotoxic meds and maintain good hydration - Comprehensive metabolic panel; Future  3. Essential hypertension -bp at goal, cannot take ace/arb, cont lopressor, amlodipine - Comprehensive metabolic panel; Future  4. Hyperlipidemia -cont lipitor - Lipid panel; Future  5. Atherosclerosis of native coronary artery of native heart without angina pectoris -cont asa, statin, beta blocker -no active symptoms, stable  6. MGUS (monoclonal gammopathy of unknown significance) -has been stable for many years w/o s/s of myeloma - CBC with Differential/Platelet; Future  7. Need for prophylactic vaccination with combined diphtheria-tetanus-pertussis (DTP) vaccine -Rx given to get booster at pharmacy - Tdap (Story) 5-2.5-18.5 LF-MCG/0.5 injection; Inject 0.5 mLs into the muscle once.  Dispense: 0.5 mL; Refill: 0  Labs/tests ordered: Orders Placed This Encounter  Procedures  . CBC with Differential/Platelet    Standing Status: Future     Number of Occurrences:      Standing Expiration  Date: 03/15/2015  . Comprehensive metabolic panel    Standing Status: Future     Number of Occurrences:      Standing Expiration Date: 03/15/2015    Order Specific Question:  Has the patient fasted?    Answer:  Yes  . Hemoglobin A1c    Standing Status: Future     Number of Occurrences:      Standing Expiration Date: 03/15/2015  . Lipid panel    Standing Status: Future     Number of Occurrences:      Standing Expiration Date: 03/15/2015    Order Specific Question:  Has the patient fasted?    Answer:  Yes    Next appt:  3 mos for annual exam and diabetic foot exam  Traci Gafford L. Saniyyah Elster, D.O. [facility] [address], [place] [address] Cell Phone (Mon-Fri 8am-5pm):  [phone] On Call:  [phone] & follow prompts after 5pm & weekends Office Phone:  [phone] Office Fax:  [phone]

## 2014-12-15 ENCOUNTER — Other Ambulatory Visit: Payer: Medicare Other

## 2015-03-20 ENCOUNTER — Telehealth: Payer: Self-pay

## 2015-03-20 NOTE — Telephone Encounter (Signed)
Called the patients home and cell both phones are disconnected, tried to call the daughter but her phine mail box is full could not leave a message will try again later.

## 2015-03-20 NOTE — Telephone Encounter (Signed)
-----   Message from Gayland Curry, DO sent at 03/20/2015  8:53 AM EST ----- Please call Mr. Caffrey and ask him to come in for labs on 04/24/15 (he will need an appt that day).  He has his physical on 04/26/15.  He did not come back for his labs after his last visit in Sept as planned--they came expired and I extended them.

## 2015-03-21 NOTE — Telephone Encounter (Signed)
Letter mailed

## 2015-03-21 NOTE — Telephone Encounter (Signed)
Tried to call the patients numbers again, same as yesterday not working numbers and mail box full for daughter.

## 2015-04-02 ENCOUNTER — Ambulatory Visit (INDEPENDENT_AMBULATORY_CARE_PROVIDER_SITE_OTHER): Payer: Medicare Other

## 2015-04-02 DIAGNOSIS — Z23 Encounter for immunization: Secondary | ICD-10-CM | POA: Diagnosis not present

## 2015-04-06 ENCOUNTER — Telehealth: Payer: Self-pay

## 2015-04-06 NOTE — Telephone Encounter (Signed)
I called patient to confirm that he requested Lidocaine 5 % ointment [facility]. Patient did request.

## 2015-04-26 ENCOUNTER — Encounter: Payer: Self-pay | Admitting: Internal Medicine

## 2015-04-26 ENCOUNTER — Ambulatory Visit (INDEPENDENT_AMBULATORY_CARE_PROVIDER_SITE_OTHER): Payer: Commercial Managed Care - HMO | Admitting: Internal Medicine

## 2015-04-26 VITALS — BP 132/78 | HR 84 | Temp 98.4°F | Ht 65.5 in | Wt 183.0 lb

## 2015-04-26 DIAGNOSIS — E1122 Type 2 diabetes mellitus with diabetic chronic kidney disease: Secondary | ICD-10-CM | POA: Diagnosis not present

## 2015-04-26 DIAGNOSIS — D472 Monoclonal gammopathy: Secondary | ICD-10-CM | POA: Diagnosis not present

## 2015-04-26 DIAGNOSIS — N183 Chronic kidney disease, stage 3 unspecified: Secondary | ICD-10-CM

## 2015-04-26 DIAGNOSIS — I10 Essential (primary) hypertension: Secondary | ICD-10-CM | POA: Diagnosis not present

## 2015-04-26 DIAGNOSIS — I2510 Atherosclerotic heart disease of native coronary artery without angina pectoris: Secondary | ICD-10-CM | POA: Diagnosis not present

## 2015-04-26 DIAGNOSIS — Z0000 Encounter for general adult medical examination without abnormal findings: Secondary | ICD-10-CM | POA: Diagnosis not present

## 2015-04-26 DIAGNOSIS — E785 Hyperlipidemia, unspecified: Secondary | ICD-10-CM | POA: Diagnosis not present

## 2015-04-26 NOTE — Patient Instructions (Signed)
Let me know if you are feeling down and need a change in your antidepressant.  Keep up the good work taking care of yourself!    Come in Monday morning fasting for labs--schedule an appointment up front.

## 2015-04-26 NOTE — Progress Notes (Signed)
Patient ID: Kevin Schwartz, male   DOB: 1930/05/19, 82 y.o.   MRN: [id number]   Location: [place]  Provider: Rexene Edison. Mariea Clonts, D.O., C.M.D.  Code Status: DNR Goals of Care: Advanced Directive information Does patient have an advance directive?: No  Chief Complaint  Patient presents with  . Annual Exam    Wellness exam  . Medical Management of Chronic Issues    blood sugar, CKD, blood pressure, cholesterol  . MMSE    29/30    HPI: Patient is a 82 y.o. male seen in the office today for an annual wellness exam and med mgt of chronic diseases.  Doing well.  Follows with Dr. Haroldine Laws for his CAD/CHF.    Sees him annually.    Sugars running well.  Taking metformin.  No diarrhea.    Depression screen [facility] 2/9 04/26/2015 09/22/2014 03/06/2014 11/29/2012  Decreased Interest 0 0 0 0  Down, Depressed, Hopeless 0 0 0 0  PHQ - 2 Score 0 0 0 0    Fall Risk  04/26/2015 12/14/2014 09/22/2014 03/06/2014 11/29/2012  Falls in the past year? No No No No No   MMSE - Mini Mental State Exam 04/26/2015  Orientation to time 5  Orientation to Place 5  Registration 3  Attention/ Calculation 4  Recall 3  Language- name 2 objects 2  Language- repeat 1  Language- follow 3 step command 3  Language- read & follow direction 1  Write a sentence 1  Copy design 1  Total score 29  passed clock  Health Maintenance  Topic Date Due  . TETANUS/TDAP  12/16/1951  . ZOSTAVAX  12/15/1992  . OPHTHALMOLOGY EXAM  04/14/2014  . FOOT EXAM  10/29/2014  . URINE MICROALBUMIN  03/07/2015  . HEMOGLOBIN A1C  03/24/2015  . INFLUENZA VACCINE  11/13/2015  . PNA vac Low Risk Adult  Completed   Urinary incontinence:  No difficulty.  Takes a natural tablet for his bladder. Functional status:  Is managing all of his own affairs.   Exercise:  Does stretches using chair, walking, bed exercises and on floor.   Diet:  Low sodium diabetic diet--fish, chicken, shrimp, veggies. Vision:  Seeing fairly well.  Due for  ophtho visit next month and thinks he needs new glasses. Hearing:  No problem. Dentition:  No difficulties with chewing and swallowing.  Due to see dentist next week.  Has one broken off that needs extraction.     Pain:  Denies pain whatsoever.  No chest pains.  No gout flares.    Lost his oldest daughter since he was here last.  She didn't come over when expected.  Her boyfriend went to the house and found her dead in the bed.     Review of Systems:  Review of Systems  Constitutional: Negative for fever, chills and malaise/fatigue.  HENT: Negative for congestion and hearing loss.   Eyes: Negative for blurred vision.       Wears glasses  Respiratory: Negative for cough and shortness of breath.   Cardiovascular: Negative for chest pain and leg swelling.  Gastrointestinal: Negative for abdominal pain, constipation, blood in stool and melena.  Genitourinary: Negative for dysuria, urgency, frequency and hematuria.  Musculoskeletal: Negative for joint pain and falls.  Skin: Negative for itching and rash.  Neurological: Negative for dizziness, loss of consciousness and weakness.  Endo/Heme/Allergies: Does not bruise/bleed easily.  Psychiatric/Behavioral: Negative for depression and memory loss. The patient does not have insomnia.     Past  Medical History  Diagnosis Date  . Renal insufficiency   . CAD (coronary artery disease)     PTCA of OM in 1998, BMS OM in 2000, Cath 9/07 LM ok LAD ok. LCX 95% in OM prior to previous stent RCA. nondominant normal EF normal. Cypher DES to OM 2007 (PLACED PROXIAMAL TO PREVIOUS STENT)  . DM type 2 (diabetes mellitus, type 2) ([place])   . HLD (hyperlipidemia)   . PVD (peripheral vascular disease) ([place])   . HTN (hypertension)   . MGUS (monoclonal gammopathy of unknown significance) 08/01/2011    IgG kappa dx 2002 8% plasma cells in bone marrow; no lesions on bone X-rays;  . S/P coronary artery stent placement 08/01/2011  . DM type 2 causing CKD stage 2 ([place])  08/01/2011  . Peripheral vascular disease, unspecified ([place])   . Personality change due to conditions classified elsewhere   . Loss of weight   . Depression   . Urinary tract infection, site not specified   . Memory loss   . Chronic kidney disease, stage III (moderate)   . Chronic kidney disease, unspecified ([place])   . Spasm of muscle   . Unspecified disorder of kidney and ureter   . Thyroid disease   . Unspecified hearing loss   . Other atopic dermatitis and related conditions   . Monoclonal paraproteinemia   . Osteoarthrosis, unspecified whether generalized or localized, unspecified site   . Gout, unspecified   . Coronary atherosclerosis of unspecified type of vessel, native or graft     Past Surgical History  Procedure Laterality Date  . Carotid stent    . Coronary angioplasty    . Knee arthroplasty      total  . Joint replacement      left knee    Allergies  Allergen Reactions  . Ace Inhibitors Swelling    angioedema  . Losartan Potassium Swelling    Angioedema   . Crestor [Rosuvastatin] Swelling      Medication List       This list is accurate as of: 04/26/15  2:42 PM.  Always use your most recent med list.               allopurinol 100 MG tablet  Commonly known as:  ZYLOPRIM  Take 1 tablet (100 mg total) by mouth daily.     amLODipine 2.5 MG tablet  Commonly known as:  NORVASC  Take 2 tablets (5 mg total) by mouth daily.     aspirin 81 MG tablet  Take 1 tablet (81 mg total) by mouth daily.     atorvastatin 40 MG tablet  Commonly known as:  LIPITOR  Take 1 tablet (40 mg total) by mouth daily.     citalopram 20 MG tablet  Commonly known as:  CELEXA  Take 1 tablet (20 mg total) by mouth daily.     metFORMIN 500 MG tablet  Commonly known as:  GLUCOPHAGE  TAKE 1 TABLET BY MOUTH EVERY DAY WITH BREAKFAST     metoprolol 50 MG tablet  Commonly known as:  LOPRESSOR  TAKE 1 TABLET BY MOUTH TWICE DAILY     nitroGLYCERIN 0.4 MG SL tablet  Commonly  known as:  NITROSTAT  Place 0.4 mg under the tongue every 5 (five) minutes as needed for chest pain. Up to 3 doses     ONE TOUCH ULTRA TEST test strip  Generic drug:  glucose blood     OT ULTRA/FASTTK CNTRL SOLN Soln  Tdap 5-2.5-18.5 LF-MCG/0.5 injection  Commonly known as:  BOOSTRIX  Inject 0.5 mLs into the muscle once.     ULTRA-THIN II MINI PEN NEEDLE 31G X 5 MM Misc  Generic drug:  Insulin Pen Needle        Physical Exam: Filed Vitals:   04/26/15 1413  BP: 132/78  Pulse: 84  Temp: 98.4 F (36.9 C)  TempSrc: Oral  Height: 5' 5.5" (1.664 m)  Weight: 183 lb (83.008 kg)  SpO2: 96%   Body mass index is 29.98 kg/(m^2). Physical Exam  Constitutional: He is oriented to person, place, and time. He appears well-developed and well-nourished. No distress.  HENT:  Head: Normocephalic and atraumatic.  Right Ear: External ear normal.  Left Ear: External ear normal.  Nose: Nose normal.  Mouth/Throat: Oropharynx is clear and moist. No oropharyngeal exudate.  Eyes: Conjunctivae and EOM are normal. Pupils are equal, round, and reactive to light.  Neck: Neck supple. No JVD present. No thyromegaly present.  Cardiovascular: Normal rate and regular rhythm.   Murmur heard. Pulmonary/Chest: Effort normal and breath sounds normal. He has no rales.  Abdominal: Soft. Bowel sounds are normal. He exhibits no distension and no mass. There is no tenderness.  Musculoskeletal: Normal range of motion. He exhibits no edema or tenderness.  Lymphadenopathy:    He has no cervical adenopathy.  Neurological: He is alert and oriented to person, place, and time. He has normal reflexes. No cranial nerve deficit.  Skin: Skin is warm and dry.  Psychiatric: He has a normal mood and affect. His behavior is normal. Judgment and thought content normal.    Labs reviewed: Basic Metabolic Panel:  Recent Labs  06/09/14 0903 09/22/14 0958  NA 141 142  K 4.9 3.9  CL 100 103  CO2 20 21  GLUCOSE 115*  106*  BUN 30* 12  CREATININE 1.55* 1.28*  CALCIUM 10.1 9.3   Liver Function Tests: No results for input(s): AST, ALT, ALKPHOS, BILITOT, PROT, ALBUMIN in the last 8760 hours. No results for input(s): LIPASE, AMYLASE in the last 8760 hours. No results for input(s): AMMONIA in the last 8760 hours. CBC:  Recent Labs  06/09/14 0903  WBC 7.4  NEUTROABS 4.7  HGB 13.0  HCT 38.4  MCV 91   Lipid Panel:  Recent Labs  06/09/14 0903  CHOL 172  HDL 53  LDLCALC 97  TRIG 111  CHOLHDL 3.2   Lab Results  Component Value Date   HGBA1C 5.7* 09/22/2014    Procedures: EKG done today:  Sinus at 82 with first degree av block  Assessment/Plan 1. Atherosclerosis of native coronary artery of native heart without angina pectoris -stable, no chest pains, breathing well, cont same meds, f/u with CHF clinic as planned - EKG 12-Lead  2. Chronic kidney disease, stage III (moderate) -stable also -avoid nsaids and drink adequate water  3. Essential hypertension -bp well controlled with current regimen,no changes  4. Hyperlipidemia -cont statin therapy, monitor  5. Controlled type 2 diabetes mellitus with stage 3 chronic kidney disease, without long-term current use of insulin (HCC) -cont dietary changes, metformin, asa; has allergy to ace/arb   6. MGUS (monoclonal gammopathy of unknown significance) -has also been stable; risk of evolving into multiple myeloma  approximately 1% per year so pt has opted to have me monitor his blood counts and spep/upep annually rather than following up with heme/onc unless something changes  7. Medicare annual wellness visit, subsequent -see hpi for details, updated on several items today  Labs/tests ordered:   Orders Placed This Encounter  Procedures  . EKG 12-Lead    Next appt:  07/26/2015 med mgt   Cornelius Schuitema L. Tari Lecount, D.O. [facility] [address], [place] [address] Cell Phone (Mon-Fri  8am-5pm):  [phone] On Call:  [phone] & follow prompts after 5pm & weekends Office Phone:  [phone] Office Fax:  [phone]

## 2015-04-26 NOTE — Progress Notes (Signed)
Patient ID: Kevin Schwartz, male   DOB: 1930-10-14, 82 y.o.   MRN: [id number] MMSE 29/30 passed clock drawing

## 2015-04-30 ENCOUNTER — Other Ambulatory Visit: Payer: Commercial Managed Care - HMO

## 2015-04-30 DIAGNOSIS — D472 Monoclonal gammopathy: Secondary | ICD-10-CM | POA: Diagnosis not present

## 2015-04-30 DIAGNOSIS — N183 Chronic kidney disease, stage 3 unspecified: Secondary | ICD-10-CM

## 2015-04-30 DIAGNOSIS — E785 Hyperlipidemia, unspecified: Secondary | ICD-10-CM | POA: Diagnosis not present

## 2015-04-30 DIAGNOSIS — I10 Essential (primary) hypertension: Secondary | ICD-10-CM

## 2015-04-30 DIAGNOSIS — E1129 Type 2 diabetes mellitus with other diabetic kidney complication: Secondary | ICD-10-CM

## 2015-05-01 ENCOUNTER — Telehealth: Payer: Self-pay

## 2015-05-01 LAB — CBC WITH DIFFERENTIAL/PLATELET
Basophils Absolute: 0 10*3/uL (ref 0.0–0.2)
Basos: 1 %
EOS (ABSOLUTE): 0.2 10*3/uL (ref 0.0–0.4)
Eos: 3 %
Hematocrit: 39.8 % (ref 37.5–51.0)
Hemoglobin: 13.3 g/dL (ref 12.6–17.7)
Immature Grans (Abs): 0 10*3/uL (ref 0.0–0.1)
Immature Granulocytes: 0 %
Lymphocytes Absolute: 1.9 10*3/uL (ref 0.7–3.1)
Lymphs: 27 %
MCH: 30.4 pg (ref 26.6–33.0)
MCHC: 33.4 g/dL (ref 31.5–35.7)
MCV: 91 fL (ref 79–97)
Monocytes Absolute: 0.4 10*3/uL (ref 0.1–0.9)
Monocytes: 6 %
Neutrophils Absolute: 4.4 10*3/uL (ref 1.4–7.0)
Neutrophils: 63 %
Platelets: 442 10*3/uL — ABNORMAL HIGH (ref 150–379)
RBC: 4.37 x10E6/uL (ref 4.14–5.80)
RDW: 14.5 % (ref 12.3–15.4)
WBC: 6.9 10*3/uL (ref 3.4–10.8)

## 2015-05-01 LAB — COMPREHENSIVE METABOLIC PANEL
ALT: 23 IU/L (ref 0–44)
AST: 24 IU/L (ref 0–40)
Albumin/Globulin Ratio: 1.2 (ref 1.1–2.5)
Albumin: 4.1 g/dL (ref 3.5–4.7)
Alkaline Phosphatase: 73 IU/L (ref 39–117)
BUN/Creatinine Ratio: 13 (ref 10–22)
BUN: 21 mg/dL (ref 8–27)
Bilirubin Total: 0.3 mg/dL (ref 0.0–1.2)
CO2: 22 mmol/L (ref 18–29)
Calcium: 9.4 mg/dL (ref 8.6–10.2)
Chloride: 103 mmol/L (ref 96–106)
Creatinine, Ser: 1.63 mg/dL — ABNORMAL HIGH (ref 0.76–1.27)
GFR calc Af Amer: 45 mL/min/{1.73_m2} — ABNORMAL LOW (ref >59)
GFR calc non Af Amer: 39 mL/min/{1.73_m2} — ABNORMAL LOW (ref >59)
Globulin, Total: 3.3 g/dL (ref 1.5–4.5)
Glucose: 130 mg/dL — ABNORMAL HIGH (ref 65–99)
Potassium: 4.3 mmol/L (ref 3.5–5.2)
Sodium: 143 mmol/L (ref 134–144)
Total Protein: 7.4 g/dL (ref 6.0–8.5)

## 2015-05-01 LAB — LIPID PANEL
Chol/HDL Ratio: 4.4 ratio units (ref 0.0–5.0)
Cholesterol, Total: 244 mg/dL — ABNORMAL HIGH (ref 100–199)
HDL: 55 mg/dL (ref >39)
LDL Calculated: 164 mg/dL — ABNORMAL HIGH (ref 0–99)
Triglycerides: 123 mg/dL (ref 0–149)
VLDL Cholesterol Cal: 25 mg/dL (ref 5–40)

## 2015-05-01 LAB — HEMOGLOBIN A1C
Est. average glucose Bld gHb Est-mCnc: 120 mg/dL
Hgb A1c MFr Bld: 5.8 % — ABNORMAL HIGH (ref 4.8–5.6)

## 2015-05-01 MED ORDER — ATORVASTATIN CALCIUM 80 MG PO TABS: 80.0000 mg | ORAL_TABLET | Freq: Every day | ORAL | Status: DC

## 2015-05-01 NOTE — Telephone Encounter (Signed)
-----   Message from Gayland Curry, DO sent at 05/01/2015 10:25 AM EST ----- Kidney function has declined considerably.  Has he been taking aleve or ibuprofen, similar meds?  Increase water intake to 6-8 8oz glasses per day. Cholesterol has worsened dramatically.  I would like him to increase his lipitor to 80mg .   Sugar average only slightly up at 5.8 vs 5.7 last time.  Increase aerobic exercise like walking, cycling or swimming/water aerobics if possible.

## 2015-05-18 ENCOUNTER — Other Ambulatory Visit: Payer: Self-pay | Admitting: *Deleted

## 2015-05-18 DIAGNOSIS — I10 Essential (primary) hypertension: Secondary | ICD-10-CM

## 2015-05-18 DIAGNOSIS — E118 Type 2 diabetes mellitus with unspecified complications: Secondary | ICD-10-CM

## 2015-05-18 MED ORDER — METFORMIN HCL 500 MG PO TABS
ORAL_TABLET | ORAL | Status: DC
Start: 2015-05-18 — End: 2015-05-21

## 2015-05-18 MED ORDER — AMLODIPINE BESYLATE 2.5 MG PO TABS: 5.0000 mg | ORAL_TABLET | Freq: Every day | ORAL | Status: DC

## 2015-05-18 MED ORDER — ATORVASTATIN CALCIUM 80 MG PO TABS: 80.0000 mg | ORAL_TABLET | Freq: Every day | ORAL | Status: DC

## 2015-05-18 MED ORDER — METOPROLOL TARTRATE 50 MG PO TABS: ORAL_TABLET | ORAL | Status: DC

## 2015-05-18 MED ORDER — ALLOPURINOL 100 MG PO TABS: 100.0000 mg | ORAL_TABLET | Freq: Every day | ORAL | Status: DC

## 2015-05-18 MED ORDER — CITALOPRAM HYDROBROMIDE 20 MG PO TABS: 20.0000 mg | ORAL_TABLET | Freq: Every day | ORAL | Status: DC

## 2015-05-18 NOTE — Telephone Encounter (Signed)
[facility] 

## 2015-05-21 ENCOUNTER — Other Ambulatory Visit: Payer: Self-pay

## 2015-05-21 DIAGNOSIS — E118 Type 2 diabetes mellitus with unspecified complications: Secondary | ICD-10-CM

## 2015-05-21 MED ORDER — CITALOPRAM HYDROBROMIDE 20 MG PO TABS: 20.0000 mg | ORAL_TABLET | Freq: Every day | ORAL | Status: DC

## 2015-05-21 MED ORDER — METOPROLOL TARTRATE 50 MG PO TABS: ORAL_TABLET | ORAL | Status: DC

## 2015-05-21 MED ORDER — METFORMIN HCL 500 MG PO TABS: ORAL_TABLET | ORAL | Status: DC

## 2015-05-22 ENCOUNTER — Telehealth: Payer: Self-pay | Admitting: Internal Medicine

## 2015-05-22 NOTE — Telephone Encounter (Signed)
Received a call from [place] at [facility] needing a [place] referral for patient's appointment 05/24/15 with Dr. Lenise Arena # [id number] Valid 05/24/15 - 11/20/15 For 6 visits Dr. Lind Guest - [facility] Phone: [phone] Fax: [phone]

## 2015-06-04 ENCOUNTER — Other Ambulatory Visit: Payer: Self-pay | Admitting: *Deleted

## 2015-06-04 DIAGNOSIS — E118 Type 2 diabetes mellitus with unspecified complications: Secondary | ICD-10-CM

## 2015-06-04 MED ORDER — METFORMIN HCL 500 MG PO TABS: ORAL_TABLET | ORAL | Status: DC

## 2015-06-04 MED ORDER — METOPROLOL TARTRATE 50 MG PO TABS: ORAL_TABLET | ORAL | Status: DC

## 2015-06-04 MED ORDER — CITALOPRAM HYDROBROMIDE 20 MG PO TABS: 20.0000 mg | ORAL_TABLET | Freq: Every day | ORAL | Status: DC

## 2015-06-04 NOTE — Telephone Encounter (Signed)
[facility] 

## 2015-06-05 ENCOUNTER — Other Ambulatory Visit: Payer: Self-pay | Admitting: *Deleted

## 2015-06-05 DIAGNOSIS — I10 Essential (primary) hypertension: Secondary | ICD-10-CM

## 2015-06-05 MED ORDER — ALLOPURINOL 100 MG PO TABS: 100.0000 mg | ORAL_TABLET | Freq: Every day | ORAL | Status: DC

## 2015-06-05 MED ORDER — ATORVASTATIN CALCIUM 80 MG PO TABS: 80.0000 mg | ORAL_TABLET | Freq: Every day | ORAL | Status: DC

## 2015-06-05 MED ORDER — AMLODIPINE BESYLATE 2.5 MG PO TABS: 5.0000 mg | ORAL_TABLET | Freq: Every day | ORAL | Status: DC

## 2015-06-05 NOTE — Telephone Encounter (Signed)
[facility] 

## 2015-06-27 DIAGNOSIS — R809 Proteinuria, unspecified: Secondary | ICD-10-CM | POA: Diagnosis not present

## 2015-06-27 DIAGNOSIS — T783XXA Angioneurotic edema, initial encounter: Secondary | ICD-10-CM | POA: Diagnosis not present

## 2015-06-27 DIAGNOSIS — E1121 Type 2 diabetes mellitus with diabetic nephropathy: Secondary | ICD-10-CM | POA: Diagnosis not present

## 2015-06-27 DIAGNOSIS — D472 Monoclonal gammopathy: Secondary | ICD-10-CM | POA: Diagnosis not present

## 2015-06-27 DIAGNOSIS — I150 Renovascular hypertension: Secondary | ICD-10-CM | POA: Diagnosis not present

## 2015-06-27 DIAGNOSIS — N183 Chronic kidney disease, stage 3 (moderate): Secondary | ICD-10-CM | POA: Diagnosis not present

## 2015-07-26 ENCOUNTER — Encounter: Payer: Self-pay | Admitting: Internal Medicine

## 2015-07-26 ENCOUNTER — Ambulatory Visit (INDEPENDENT_AMBULATORY_CARE_PROVIDER_SITE_OTHER): Payer: Commercial Managed Care - HMO | Admitting: Internal Medicine

## 2015-07-26 VITALS — BP 138/62 | HR 61 | Temp 98.0°F | Ht 66.0 in | Wt 173.0 lb

## 2015-07-26 DIAGNOSIS — D472 Monoclonal gammopathy: Secondary | ICD-10-CM

## 2015-07-26 DIAGNOSIS — N183 Chronic kidney disease, stage 3 unspecified: Secondary | ICD-10-CM

## 2015-07-26 DIAGNOSIS — I10 Essential (primary) hypertension: Secondary | ICD-10-CM | POA: Diagnosis not present

## 2015-07-26 DIAGNOSIS — R634 Abnormal weight loss: Secondary | ICD-10-CM

## 2015-07-26 DIAGNOSIS — E785 Hyperlipidemia, unspecified: Secondary | ICD-10-CM

## 2015-07-26 DIAGNOSIS — E1122 Type 2 diabetes mellitus with diabetic chronic kidney disease: Secondary | ICD-10-CM | POA: Diagnosis not present

## 2015-07-26 NOTE — Progress Notes (Signed)
Patient ID: Kevin Schwartz, male   DOB: 10-May-1930, 82 y.o.   MRN: [id number]   Location:  [facility] clinic Provider:  Renelle Stegenga L. Mariea Clonts, D.O., C.M.D.  Code Status: DNR Goals of Care:  Advanced Directives 04/26/2015  Does patient have an advance directive? No  Type of Advance Directive -  Does patient want to make changes to advanced directive? -  Copy of advanced directive(s) in chart? -  Would patient like information on creating an advanced directive? -     Chief Complaint  Patient presents with  . Medical Management of Chronic Issues    3 mth follow-up    HPI: Patient is a 82 y.o. male seen today for medical management of chronic diseases.    Losing too much weight.  Has a good appetite.  Is eating well--no change to eating habits.  No new pains.  No melena, no hematochezia.  No difficulty urinating, gets up only 1x at night if drinks more water. Quit smoking October 15, 2002.  No coughing, wheezing.    Mood is fine. Staying out of trouble.  Has MGUS that was being monitored by hematology.  Platelets elevated at 442.  Otherwise cbc nl. Kidney function had worsened in Jan but improved again to 1.29 at nephrology in 3/17.    Past Medical History  Diagnosis Date  . Renal insufficiency   . CAD (coronary artery disease)     PTCA of OM in 1998, BMS OM in 2000, Cath 9/07 LM ok LAD ok. LCX 95% in OM prior to previous stent RCA. nondominant normal EF normal. Cypher DES to OM 2007 (PLACED PROXIAMAL TO PREVIOUS STENT)  . DM type 2 (diabetes mellitus, type 2) ([place])   . HLD (hyperlipidemia)   . PVD (peripheral vascular disease) ([place])   . HTN (hypertension)   . MGUS (monoclonal gammopathy of unknown significance) 08/01/2011    IgG kappa dx 2002 8% plasma cells in bone marrow; no lesions on bone X-rays;  . S/P coronary artery stent placement 08/01/2011  . DM type 2 causing CKD stage 2 ([place]) 08/01/2011  . Peripheral vascular disease, unspecified ([place])   . Personality change due to conditions  classified elsewhere   . Loss of weight   . Depression   . Urinary tract infection, site not specified   . Memory loss   . Chronic kidney disease, stage III (moderate)   . Chronic kidney disease, unspecified ([place])   . Spasm of muscle   . Unspecified disorder of kidney and ureter   . Thyroid disease   . Unspecified hearing loss   . Other atopic dermatitis and related conditions   . Monoclonal paraproteinemia   . Osteoarthrosis, unspecified whether generalized or localized, unspecified site   . Gout, unspecified   . Coronary atherosclerosis of unspecified type of vessel, native or graft     Past Surgical History  Procedure Laterality Date  . Carotid stent    . Coronary angioplasty    . Knee arthroplasty      total  . Joint replacement      left knee    Allergies  Allergen Reactions  . Ace Inhibitors Swelling    angioedema  . Losartan Potassium Swelling    Angioedema   . Crestor [Rosuvastatin] Swelling      Medication List       This list is accurate as of: 07/26/15 11:29 AM.  Always use your most recent med list.  allopurinol 100 MG tablet  Commonly known as:  ZYLOPRIM  Take 1 tablet (100 mg total) by mouth daily.     amLODipine 2.5 MG tablet  Commonly known as:  NORVASC  Take 2 tablets (5 mg total) by mouth daily.     aspirin 81 MG tablet  Take 1 tablet (81 mg total) by mouth daily.     atorvastatin 80 MG tablet  Commonly known as:  LIPITOR  Take 1 tablet (80 mg total) by mouth daily. For High Cholesterol     citalopram 20 MG tablet  Commonly known as:  CELEXA  Take 1 tablet (20 mg total) by mouth daily.     metFORMIN 500 MG tablet  Commonly known as:  GLUCOPHAGE  Take one tablet by mouth once daily with breakfast to control blood sugar     metoprolol 50 MG tablet  Commonly known as:  LOPRESSOR  Take one tablet by mouth twice daily for blood pressure     nitroGLYCERIN 0.4 MG SL tablet  Commonly known as:  NITROSTAT  Place 0.4 mg  under the tongue every 5 (five) minutes as needed for chest pain. Up to 3 doses     ONE TOUCH ULTRA TEST test strip  Generic drug:  glucose blood     ULTRA-THIN II MINI PEN NEEDLE 31G X 5 MM Misc  Generic drug:  Insulin Pen Needle        Review of Systems:  Review of Systems  Constitutional: Positive for weight loss. Negative for fever, chills, malaise/fatigue and diaphoresis.  HENT: Positive for hearing loss.   Eyes: Negative for blurred vision.  Respiratory: Negative for cough and shortness of breath.   Cardiovascular: Negative for chest pain and leg swelling.  Gastrointestinal: Negative for heartburn, abdominal pain, diarrhea, constipation, blood in stool and melena.  Genitourinary: Negative for dysuria, urgency, frequency and hematuria.  Musculoskeletal: Positive for myalgias and joint pain. Negative for falls.  Skin: Negative for itching and rash.  Neurological: Negative for dizziness, loss of consciousness, weakness and headaches.  Endo/Heme/Allergies: Does not bruise/bleed easily.  Psychiatric/Behavioral: Negative for depression and memory loss.    Health Maintenance  Topic Date Due  . TETANUS/TDAP  12/16/1951  . ZOSTAVAX  12/15/1992  . OPHTHALMOLOGY EXAM  04/14/2014  . FOOT EXAM  10/29/2014  . URINE MICROALBUMIN  03/07/2015  . HEMOGLOBIN A1C  10/28/2015  . INFLUENZA VACCINE  11/13/2015  . PNA vac Low Risk Adult  Completed    Physical Exam: Filed Vitals:   07/26/15 1109  BP: 138/62  Pulse: 61  Temp: 98 F (36.7 C)  TempSrc: Oral  Height: 5\' 6"  (1.676 m)  Weight: 173 lb (78.472 kg)  SpO2: 99%   Body mass index is 27.94 kg/(m^2). Physical Exam  Constitutional: He is oriented to person, place, and time. No distress.  Has definitely lost weight since last visit  HENT:  Head: Normocephalic and atraumatic.  Neck: Neck supple.  Cardiovascular: Normal rate, regular rhythm, normal heart sounds and intact distal pulses.   Pulmonary/Chest: Effort normal and  breath sounds normal. No respiratory distress. He has no wheezes. He has no rales.  Abdominal: Soft. Bowel sounds are normal. He exhibits no distension and no mass. There is no tenderness. There is no rebound and no guarding.  Musculoskeletal: He exhibits no edema.  Walks with limp, left knee wearing brace, seems due to hip not knee  Lymphadenopathy:       Head (right side): No submental and no submandibular adenopathy  present.       Head (left side): No submental and no submandibular adenopathy present.    He has no cervical adenopathy.       Right cervical: No superficial cervical, no deep cervical and no posterior cervical adenopathy present.      Left cervical: No superficial cervical, no deep cervical and no posterior cervical adenopathy present.       Right: No supraclavicular adenopathy present.       Left: No supraclavicular adenopathy present.  Neurological: He is alert and oriented to person, place, and time.  Skin: Skin is warm and dry.  Psychiatric: He has a normal mood and affect.    Labs reviewed: Basic Metabolic Panel:  Recent Labs  09/22/14 0958 04/30/15 1017  NA 142 143  K 3.9 4.3  CL 103 103  CO2 21 22  GLUCOSE 106* 130*  BUN 12 21  CREATININE 1.28* 1.63*  CALCIUM 9.3 9.4   Liver Function Tests:  Recent Labs  04/30/15 1017  AST 24  ALT 23  ALKPHOS 73  BILITOT 0.3  PROT 7.4  ALBUMIN 4.1   No results for input(s): LIPASE, AMYLASE in the last 8760 hours. No results for input(s): AMMONIA in the last 8760 hours. CBC:  Recent Labs  04/30/15 1017  WBC 6.9  NEUTROABS 4.4  HCT 39.8  MCV 91  PLT 442*   Lipid Panel:  Recent Labs  04/30/15 1017  CHOL 244*  HDL 55  LDLCALC 164*  TRIG 123  CHOLHDL 4.4   Lab Results  Component Value Date   HGBA1C 5.8* 04/30/2015    Assessment/Plan 1. Loss of weight - cause unclear -will r/o most common first considering history: - Protein Electrophoresis, Serum; Future - Protein Electrophoresis, Urine  Rflx.; Future Due to MGUS history to check for progression to myeloma  - Fecal occult blood, imunochemical to assess for colon ca (given tube to do testing and return to lab Monday or Wed)  - DG Chest 2 View due to smoking history  - CBC with Differential/Platelet; Future to check for hematologic malignancies   -he is to return Monday or Wed for these labs b/c we did not have a phlebotomist on a Thursday and they require special processing  2. Chronic kidney disease, stage III (moderate) -as this progresses, it could potentially cause weight loss, but labs had improved again when rechecked at nephrology last month -also has not had imaging in the past couple of years of the kidneys so CT abd/pelvis could be considered if above tests all prove negative  3. Essential hypertension -bp at goal, cont same meds, no changes, monitor -if elevated, nephrology suggested diltiazem for him  4. Hyperlipidemia -last LDL was elevated in Jan, he is on 80mg  of lipitor -this is the least of my concerns with this weight loss at the moment  5. MGUS (monoclonal gammopathy of unknown significance) -check for any progression to myeloma  6. Controlled type 2 diabetes mellitus with stage 3 chronic kidney disease, without long-term current use of insulin ([place]) -has been well controlled with metformin, is on statin, asa, but had angioedema with ace/arb   Labs/tests ordered:   Orders Placed This Encounter  Procedures  . Fecal occult blood, imunochemical  . DG Chest 2 View    Order Specific Question:  Reason for Exam (SYMPTOM  OR DIAGNOSIS REQUIRED)    Answer:  smoking history, new weight loss of 10 lbs in 3 months    Order Specific Question:  Preferred  imaging location?    Answer:  [facility]  . Protein Electrophoresis, Serum    Standing Status: Future     Number of Occurrences:      Standing Expiration Date: 08/25/2015  . Protein Electrophoresis, Urine Rflx.    Standing Status: Future      Number of Occurrences:      Standing Expiration Date: 08/25/2015  . CBC with Differential/Platelet    Standing Status: Future     Number of Occurrences:      Standing Expiration Date: 08/25/2015    Next appt: 08/30/2015 for f/u weight loss  Emerie Vanderkolk L. Brady Plant, D.O. [facility] [address], [place] [address] Cell Phone (Mon-Fri 8am-5pm):  [phone] On Call:  [phone] & follow prompts after 5pm & weekends Office Phone:  [phone] Office Fax:  [phone]

## 2015-07-30 ENCOUNTER — Telehealth: Payer: Self-pay

## 2015-07-30 ENCOUNTER — Other Ambulatory Visit: Payer: Commercial Managed Care - HMO

## 2015-07-30 ENCOUNTER — Ambulatory Visit
Admission: RE | Admit: 2015-07-30 | Discharge: 2015-07-30 | Disposition: A | Discharge status: Home / Self Care | Payer: Commercial Managed Care - HMO | Source: Ambulatory Visit | Attending: Internal Medicine | Admitting: Internal Medicine

## 2015-07-30 DIAGNOSIS — R634 Abnormal weight loss: Secondary | ICD-10-CM

## 2015-07-30 DIAGNOSIS — F17210 Nicotine dependence, cigarettes, uncomplicated: Secondary | ICD-10-CM | POA: Diagnosis not present

## 2015-07-30 NOTE — Telephone Encounter (Signed)
Patient called the office back after he listen to voice mail left for him, I told him what his lab results were he had no questions.

## 2015-08-01 LAB — PROTEIN ELECTROPHORESIS, SERUM
A/G Ratio: 1.1 (ref 0.7–1.7)
Albumin ELP: 4 g/dL (ref 2.9–4.4)
Alpha 1: 0.2 g/dL (ref 0.0–0.4)
Alpha 2: 0.7 g/dL (ref 0.4–1.0)
Beta: 1.1 g/dL (ref 0.7–1.3)
Gamma Globulin: 1.6 g/dL (ref 0.4–1.8)
Globulin, Total: 3.5 g/dL (ref 2.2–3.9)
M-Spike, %: 0.3 g/dL — ABNORMAL HIGH
Total Protein: 7.5 g/dL (ref 6.0–8.5)

## 2015-08-01 LAB — PROTEIN ELECTROPHORESIS, URINE REFLEX
Albumin ELP, Urine: 73.6 %
Alpha-1-Globulin, U: 4 %
Alpha-2-Globulin, U: 4.9 %
Beta Globulin, U: 9.4 %
Gamma Globulin, U: 8.1 %
Protein, Ur: 65.8 mg/dL

## 2015-08-01 LAB — CBC WITH DIFFERENTIAL/PLATELET
Basophils Absolute: 0 10*3/uL (ref 0.0–0.2)
Basos: 0 %
EOS (ABSOLUTE): 0.3 10*3/uL (ref 0.0–0.4)
Eos: 4 %
Hematocrit: 38.8 % (ref 37.5–51.0)
Hemoglobin: 12.8 g/dL (ref 12.6–17.7)
Immature Grans (Abs): 0 10*3/uL (ref 0.0–0.1)
Immature Granulocytes: 0 %
Lymphocytes Absolute: 1.8 10*3/uL (ref 0.7–3.1)
Lymphs: 25 %
MCH: 30.6 pg (ref 26.6–33.0)
MCHC: 33 g/dL (ref 31.5–35.7)
MCV: 93 fL (ref 79–97)
Monocytes Absolute: 0.4 10*3/uL (ref 0.1–0.9)
Monocytes: 6 %
Neutrophils Absolute: 4.8 10*3/uL (ref 1.4–7.0)
Neutrophils: 65 %
Platelets: 382 10*3/uL — ABNORMAL HIGH (ref 150–379)
RBC: 4.18 x10E6/uL (ref 4.14–5.80)
RDW: 15.2 % (ref 12.3–15.4)
WBC: 7.3 10*3/uL (ref 3.4–10.8)

## 2015-08-03 ENCOUNTER — Other Ambulatory Visit: Payer: Self-pay

## 2015-08-03 NOTE — Telephone Encounter (Signed)
Verbal order given to [facility] for patient to continue with compound cream as it was appointment in 03/2015 by Dr.Reed.

## 2015-08-30 ENCOUNTER — Encounter: Payer: Self-pay | Admitting: Internal Medicine

## 2015-08-30 ENCOUNTER — Ambulatory Visit (INDEPENDENT_AMBULATORY_CARE_PROVIDER_SITE_OTHER): Payer: Commercial Managed Care - HMO | Admitting: Internal Medicine

## 2015-08-30 VITALS — BP 130/70 | HR 55 | Temp 98.3°F | Ht 66.0 in | Wt 174.0 lb

## 2015-08-30 DIAGNOSIS — D472 Monoclonal gammopathy: Secondary | ICD-10-CM | POA: Diagnosis not present

## 2015-08-30 DIAGNOSIS — R634 Abnormal weight loss: Secondary | ICD-10-CM | POA: Diagnosis not present

## 2015-08-30 DIAGNOSIS — M10072 Idiopathic gout, left ankle and foot: Secondary | ICD-10-CM | POA: Diagnosis not present

## 2015-08-30 DIAGNOSIS — N183 Chronic kidney disease, stage 3 unspecified: Secondary | ICD-10-CM

## 2015-08-30 DIAGNOSIS — M10472 Other secondary gout, left ankle and foot: Secondary | ICD-10-CM | POA: Diagnosis not present

## 2015-08-30 NOTE — Progress Notes (Signed)
Location:  [facility] clinic Provider:  Haileigh Pitz L. Mariea Clonts, D.O., C.M.D.  Code Status: full code Goals of Care:  Advanced Directives 08/30/2015  Does patient have an advance directive? No  Type of Advance Directive -  Does patient want to make changes to advanced directive? -  Copy of advanced directive(s) in chart? -  Would patient like information on creating an advanced directive? Yes - Geneticist, molecular Complaint  Patient presents with  . Medical Management of Chronic Issues    1 mth follow-up for weight loss    HPI: Patient is a 82 y.o. male seen today for medical management of chronic diseases.    Still has no clear symptoms for losing weight.  Weight stabilized and gained a lb now.  Is eating well, but that has not changed.    Got a gout flare in his left foot which is already the bad leg.  It's finally going away.  Is taking his allopurinol daily.  Still a little swollen.  Had shrimp.    Past Medical History  Diagnosis Date  . Renal insufficiency   . CAD (coronary artery disease)     PTCA of OM in 1998, BMS OM in 2000, Cath 9/07 LM ok LAD ok. LCX 95% in OM prior to previous stent RCA. nondominant normal EF normal. Cypher DES to OM 2007 (PLACED PROXIAMAL TO PREVIOUS STENT)  . DM type 2 (diabetes mellitus, type 2) ([place])   . HLD (hyperlipidemia)   . PVD (peripheral vascular disease) ([place])   . HTN (hypertension)   . MGUS (monoclonal gammopathy of unknown significance) 08/01/2011    IgG kappa dx 2002 8% plasma cells in bone marrow; no lesions on bone X-rays;  . S/P coronary artery stent placement 08/01/2011  . DM type 2 causing CKD stage 2 ([place]) 08/01/2011  . Peripheral vascular disease, unspecified ([place])   . Personality change due to conditions classified elsewhere   . Loss of weight   . Depression   . Urinary tract infection, site not specified   . Memory loss   . Chronic kidney disease, stage III (moderate)   . Chronic kidney disease, unspecified ([place])   .  Spasm of muscle   . Unspecified disorder of kidney and ureter   . Thyroid disease   . Unspecified hearing loss   . Other atopic dermatitis and related conditions   . Monoclonal paraproteinemia   . Osteoarthrosis, unspecified whether generalized or localized, unspecified site   . Gout, unspecified   . Coronary atherosclerosis of unspecified type of vessel, native or graft     Past Surgical History  Procedure Laterality Date  . Carotid stent    . Coronary angioplasty    . Knee arthroplasty      total  . Joint replacement      left knee    Allergies  Allergen Reactions  . Ace Inhibitors Swelling    angioedema  . Losartan Potassium Swelling    Angioedema   . Crestor [Rosuvastatin] Swelling      Medication List       This list is accurate as of: 08/30/15  8:27 AM.  Always use your most recent med list.               allopurinol 100 MG tablet  Commonly known as:  ZYLOPRIM  Take 1 tablet (100 mg total) by mouth daily.     amLODipine 2.5 MG tablet  Commonly known as:  NORVASC  Take 2  tablets (5 mg total) by mouth daily.     aspirin 81 MG tablet  Take 1 tablet (81 mg total) by mouth daily.     atorvastatin 80 MG tablet  Commonly known as:  LIPITOR  Take 1 tablet (80 mg total) by mouth daily. For High Cholesterol     citalopram 20 MG tablet  Commonly known as:  CELEXA  Take 1 tablet (20 mg total) by mouth daily.     metFORMIN 500 MG tablet  Commonly known as:  GLUCOPHAGE  Take one tablet by mouth once daily with breakfast to control blood sugar     metoprolol 50 MG tablet  Commonly known as:  LOPRESSOR  Take one tablet by mouth twice daily for blood pressure     nitroGLYCERIN 0.4 MG SL tablet  Commonly known as:  NITROSTAT  Place 0.4 mg under the tongue every 5 (five) minutes as needed for chest pain. Up to 3 doses     ONE TOUCH ULTRA TEST test strip  Generic drug:  glucose blood     ULTRA-THIN II MINI PEN NEEDLE 31G X 5 MM Misc  Generic drug:  Insulin  Pen Needle        Review of Systems:  Review of Systems  Constitutional: Negative for fever, chills, weight loss and malaise/fatigue.       Wt up one lb   HENT: Negative for congestion.   Eyes: Negative for blurred vision.  Respiratory: Negative for cough and shortness of breath.   Cardiovascular: Negative for chest pain, palpitations and leg swelling.  Gastrointestinal: Negative for abdominal pain, constipation, blood in stool and melena.  Genitourinary: Negative for dysuria, urgency and frequency.  Musculoskeletal: Positive for joint pain. Negative for falls.       Left foot  Skin: Negative for rash.  Neurological: Negative for dizziness, loss of consciousness and weakness.  Endo/Heme/Allergies: Does not bruise/bleed easily.  Psychiatric/Behavioral: Negative for depression and memory loss.    Health Maintenance  Topic Date Due  . TETANUS/TDAP  12/16/1949  . ZOSTAVAX  12/15/1992  . OPHTHALMOLOGY EXAM  04/14/2014  . FOOT EXAM  10/29/2014  . URINE MICROALBUMIN  03/07/2015  . HEMOGLOBIN A1C  10/28/2015  . INFLUENZA VACCINE  11/13/2015  . PNA vac Low Risk Adult  Completed    Physical Exam: Filed Vitals:   08/30/15 0804  BP: 130/70  Pulse: 55  Temp: 98.3 F (36.8 C)  TempSrc: Oral  Height: 5\' 6"  (1.676 m)  Weight: 174 lb (78.926 kg)  SpO2: 99%   Body mass index is 28.1 kg/(m^2). Physical Exam  Constitutional: He is oriented to person, place, and time. He appears well-developed and well-nourished. No distress.  Cardiovascular: Normal rate, regular rhythm, normal heart sounds and intact distal pulses.   Pulmonary/Chest: Effort normal and breath sounds normal. No respiratory distress.  Abdominal: Soft. Bowel sounds are normal. He exhibits no distension. There is no tenderness.  Musculoskeletal: Normal range of motion. He exhibits tenderness.  Left foot mildly edematous especially around first MTP region; limping slightly  Neurological: He is alert and oriented to  person, place, and time.  Skin: Skin is warm and dry.  Psychiatric: He has a normal mood and affect.    Labs reviewed: Basic Metabolic Panel:  Recent Labs  09/22/14 0958 04/30/15 1017  NA 142 143  K 3.9 4.3  CL 103 103  CO2 21 22  GLUCOSE 106* 130*  BUN 12 21  CREATININE 1.28* 1.63*  CALCIUM 9.3 9.4  Liver Function Tests:  Recent Labs  04/30/15 1017 07/30/15 0919  AST 24  --   ALT 23  --   ALKPHOS 73  --   BILITOT 0.3  --   PROT 7.4 7.5  ALBUMIN 4.1  --    No results for input(s): LIPASE, AMYLASE in the last 8760 hours. No results for input(s): AMMONIA in the last 8760 hours. CBC:  Recent Labs  04/30/15 1017 07/30/15 0919  WBC 6.9 7.3  NEUTROABS 4.4 4.8  HCT 39.8 38.8  MCV 91 93  PLT 442* 382*   Lipid Panel:  Recent Labs  04/30/15 1017  CHOL 244*  HDL 55  LDLCALC 164*  TRIG 123  CHOLHDL 4.4   Lab Results  Component Value Date   HGBA1C 5.8* 04/30/2015    Assessment/Plan 1. Other secondary acute gout of left foot -due to increased shrimp recently -will cut back -reports much improvement on its own so will not treat with colchicine or prednisone at this time and not a candidate for nsaids with his renal function  2. Chronic kidney disease, stage III (moderate) -stable, cont to monitor, maintain hydration and avoid nephrotoxic meds  3. Loss of weight -has stabilized, no clear symptoms -labs did not reveal anything remarkable -I don't have his hemoccult result--asked CMA to call lab to get result  4. MGUS (monoclonal gammopathy of unknown significance) --no M spike on spep/upep to suggest any progression to myeloma -cont to monitor annually  Labs/tests ordered:  No orders of the defined types were placed in this encounter.   Next appt:  11/30/2015    Jaimie Redditt L. Latressa Harries, D.O. [facility] [address], [place] [address] Cell Phone (Mon-Fri 8am-5pm):  [phone] On Call:   [phone] & follow prompts after 5pm & weekends Office Phone:  [phone] Office Fax:  [phone]

## 2015-08-31 ENCOUNTER — Telehealth: Payer: Self-pay | Admitting: *Deleted

## 2015-08-31 NOTE — Telephone Encounter (Signed)
Patient called back and he understood and will stop by to get another kit. I will leave at the front desk.

## 2015-08-31 NOTE — Telephone Encounter (Signed)
Called pt to advise that his FOBT test needs to be redone, pt mailed into [facility] and they disgarded because no order was with it. Pt was advised NOT to mail to [facility] but to bring back to [facility]. Left message to have pt return my call.

## 2015-09-11 NOTE — Telephone Encounter (Signed)
I will check at the office to see if patient picked up kit I left at the front desk

## 2015-09-14 NOTE — Telephone Encounter (Signed)
Please call him again to pick up.

## 2015-09-14 NOTE — Telephone Encounter (Signed)
.  left message to have patient return my call.  

## 2015-09-20 NOTE — Telephone Encounter (Signed)
Spoke with patient again and he said he will come by tomorrow to pick up the stool kit

## 2015-11-30 ENCOUNTER — Encounter: Payer: Self-pay | Admitting: Internal Medicine

## 2015-11-30 ENCOUNTER — Ambulatory Visit (INDEPENDENT_AMBULATORY_CARE_PROVIDER_SITE_OTHER): Payer: Commercial Managed Care - HMO | Admitting: Internal Medicine

## 2015-11-30 VITALS — BP 148/80 | HR 57 | Temp 97.9°F | Wt 169.0 lb

## 2015-11-30 DIAGNOSIS — I2510 Atherosclerotic heart disease of native coronary artery without angina pectoris: Secondary | ICD-10-CM | POA: Diagnosis not present

## 2015-11-30 DIAGNOSIS — N183 Chronic kidney disease, stage 3 unspecified: Secondary | ICD-10-CM

## 2015-11-30 DIAGNOSIS — R634 Abnormal weight loss: Secondary | ICD-10-CM | POA: Diagnosis not present

## 2015-11-30 DIAGNOSIS — Z23 Encounter for immunization: Secondary | ICD-10-CM | POA: Diagnosis not present

## 2015-11-30 DIAGNOSIS — R600 Localized edema: Secondary | ICD-10-CM

## 2015-11-30 DIAGNOSIS — I10 Essential (primary) hypertension: Secondary | ICD-10-CM | POA: Diagnosis not present

## 2015-11-30 DIAGNOSIS — E1122 Type 2 diabetes mellitus with diabetic chronic kidney disease: Secondary | ICD-10-CM

## 2015-11-30 MED ORDER — POTASSIUM CHLORIDE CRYS ER 20 MEQ PO TBCR: 20.0000 meq | EXTENDED_RELEASE_TABLET | Freq: Every day | ORAL | 0 refills | Status: DC

## 2015-11-30 MED ORDER — FUROSEMIDE 40 MG PO TABS: 40.0000 mg | ORAL_TABLET | Freq: Every day | ORAL | 0 refills | Status: DC

## 2015-11-30 NOTE — Progress Notes (Signed)
Location:  [facility] clinic Provider:  Demondre Aguas L. Mariea Clonts, D.O., C.M.D.  Code Status:  Goals of Care:  Advanced Directives 11/30/2015  Does patient have an advance directive? -  Type of Advance Directive -  Does patient want to make changes to advanced directive? -  Copy of advanced directive(s) in chart? No - copy requested  Would patient like information on creating an advanced directive? -   Chief Complaint  Patient presents with  . Medical Management of Chronic Issues    3 mth follow-up    HPI: Patient is a 82 y.o. male seen today for medical management of chronic diseases.    Has lost another 5 lbs.  Has not had any difficulty with his bowels.  No hematochezia, melena.  No constipation or diarrhea.  Eats pretty well--eats a breakfast, brunch, and supper.  Reports feeling out of it this week.  Says he has days like that where he doesn't feel very good.    Says his bday is next month and he's going to try to get his weight back straight.  Having to have clothes altered, etc. And it's expensive.   Has eye appt coming up next month.   Past Medical History:  Diagnosis Date  . CAD (coronary artery disease)    PTCA of OM in 1998, BMS OM in 2000, Cath 9/07 LM ok LAD ok. LCX 95% in OM prior to previous stent RCA. nondominant normal EF normal. Cypher DES to OM 2007 (PLACED PROXIAMAL TO PREVIOUS STENT)  . Chronic kidney disease, stage III (moderate)   . Chronic kidney disease, unspecified ([place])   . Coronary atherosclerosis of unspecified type of vessel, native or graft   . Depression   . DM type 2 (diabetes mellitus, type 2) ([place])   . DM type 2 causing CKD stage 2 ([place]) 08/01/2011  . Gout, unspecified   . HLD (hyperlipidemia)   . HTN (hypertension)   . Loss of weight   . Memory loss   . MGUS (monoclonal gammopathy of unknown significance) 08/01/2011   IgG kappa dx 2002 8% plasma cells in bone marrow; no lesions on bone X-rays;  [name] Monoclonal paraproteinemia   . Osteoarthrosis, unspecified  whether generalized or localized, unspecified site   . Other atopic dermatitis and related conditions   . Peripheral vascular disease, unspecified ([place])   . Personality change due to conditions classified elsewhere   . PVD (peripheral vascular disease) ([place])   . Renal insufficiency   . S/P coronary artery stent placement 08/01/2011  . Spasm of muscle   . Thyroid disease   . Unspecified disorder of kidney and ureter   . Unspecified hearing loss   . Urinary tract infection, site not specified     Past Surgical History:  Procedure Laterality Date  . CAROTID STENT    . CORONARY ANGIOPLASTY    . JOINT REPLACEMENT     left knee  . KNEE ARTHROPLASTY     total    Allergies  Allergen Reactions  . Ace Inhibitors Swelling    angioedema  . Losartan Potassium Swelling    Angioedema   . Crestor [Rosuvastatin] Swelling      Medication List       Accurate as of 11/30/15  9:31 AM. Always use your most recent med list.          allopurinol 100 MG tablet Commonly known as:  ZYLOPRIM Take 1 tablet (100 mg total) by mouth daily.   amLODipine 2.5 MG tablet Commonly  known as:  NORVASC Take 2 tablets (5 mg total) by mouth daily.   aspirin 81 MG tablet Take 1 tablet (81 mg total) by mouth daily.   atorvastatin 80 MG tablet Commonly known as:  LIPITOR Take 1 tablet (80 mg total) by mouth daily. For High Cholesterol   citalopram 20 MG tablet Commonly known as:  CELEXA Take 1 tablet (20 mg total) by mouth daily.   metFORMIN 500 MG tablet Commonly known as:  GLUCOPHAGE Take one tablet by mouth once daily with breakfast to control blood sugar   metoprolol 50 MG tablet Commonly known as:  LOPRESSOR Take one tablet by mouth twice daily for blood pressure   nitroGLYCERIN 0.4 MG SL tablet Commonly known as:  NITROSTAT Place 0.4 mg under the tongue every 5 (five) minutes as needed for chest pain. Up to 3 doses   ONE TOUCH ULTRA TEST test strip Generic drug:  glucose blood     ULTRA-THIN II MINI PEN NEEDLE 31G X 5 MM Misc Generic drug:  Insulin Pen Needle       Review of Systems:  Review of Systems  Constitutional: Positive for malaise/fatigue and weight loss. Negative for chills, diaphoresis and fever.  HENT: Positive for hearing loss. Negative for congestion.   Eyes: Negative for blurred vision.  Respiratory: Negative for shortness of breath.   Cardiovascular: Negative for chest pain, palpitations and leg swelling.  Gastrointestinal: Negative for abdominal pain, blood in stool, constipation, diarrhea and melena.       He has not noted any blood in his stool or dark stool  Genitourinary: Negative for dysuria.  Musculoskeletal: Positive for joint pain. Negative for falls and myalgias.  Skin: Negative for itching and rash.  Neurological: Positive for weakness. Negative for dizziness, loss of consciousness and headaches.  Endo/Heme/Allergies: Does not bruise/bleed easily.  Psychiatric/Behavioral: Negative for depression and memory loss. The patient is not nervous/anxious and does not have insomnia.     Health Maintenance  Topic Date Due  . TETANUS/TDAP  12/16/1949  . ZOSTAVAX  12/15/1992  . OPHTHALMOLOGY EXAM  04/14/2014  . FOOT EXAM  10/29/2014  . URINE MICROALBUMIN  03/07/2015  . HEMOGLOBIN A1C  10/28/2015  . INFLUENZA VACCINE  11/13/2015  . PNA vac Low Risk Adult  Completed    Physical Exam: Vitals:   11/30/15 0926  BP: (!) 148/80  Pulse: (!) 57  Temp: 97.9 F (36.6 C)  TempSrc: Oral  SpO2: 97%  Weight: 169 lb (76.7 kg)   Body mass index is 27.28 kg/m. Physical Exam  Constitutional: He is oriented to person, place, and time. He appears well-developed and well-nourished. No distress.  Cardiovascular: Normal rate, regular rhythm, normal heart sounds and intact distal pulses.   Pulmonary/Chest: Effort normal and breath sounds normal. No respiratory distress.  Abdominal: Soft. Bowel sounds are normal. He exhibits no distension and no  mass. There is no tenderness. There is no rebound and no guarding. No hernia.  Musculoskeletal: Normal range of motion.  Stooped posture  Neurological: He is alert and oriented to person, place, and time.  Skin: Skin is warm and dry.  Psychiatric: He has a normal mood and affect.    Labs reviewed: Basic Metabolic Panel:  Recent Labs  04/30/15 1017  NA 143  K 4.3  CL 103  CO2 22  GLUCOSE 130*  BUN 21  CREATININE 1.63*  CALCIUM 9.4   Liver Function Tests:  Recent Labs  04/30/15 1017 07/30/15 0919  AST 24  --  ALT 23  --   ALKPHOS 73  --   BILITOT 0.3  --   PROT 7.4 7.5  ALBUMIN 4.1  --    No results for input(s): LIPASE, AMYLASE in the last 8760 hours. No results for input(s): AMMONIA in the last 8760 hours. CBC:  Recent Labs  04/30/15 1017 07/30/15 0919  WBC 6.9 7.3  NEUTROABS 4.4 4.8  HCT 39.8 38.8  MCV 91 93  PLT 442* 382*   Lipid Panel:  Recent Labs  04/30/15 1017  CHOL 244*  HDL 55  LDLCALC 164*  TRIG 123  CHOLHDL 4.4   Lab Results  Component Value Date   HGBA1C 5.8 (H) 04/30/2015    Assessment/Plan 1. Localized edema -of ankles, concerning that his protein intake is inadequate -if he develops other symptoms, I'll reevaluate his heart with an EKG and echo -counseled on low sodium diet, elevating feet at rest  2. Chronic kidney disease, stage III (moderate) -stable renal function, avoid nsaids, hydrate and monitor  3. Loss of weight -has been concerning for several months -initial screening labs were negative and no stool in vault for hemoccult, gave him an FOBT to do but it got mailed to the lab and we never got results so we called him to pick up another one, but he never came in--did take it with him today -also gave some samples of ensure and boost supplements and recommended he have one per day -he says he is going to get his health back in order this coming month b/c it's his bday  4. Essential hypertension -bp controlled with  current regimen above, cont same and monitor  5. Controlled type 2 diabetes mellitus with stage 3 chronic kidney disease, without long-term current use of insulin ([place]) -has been diet controlled longstanding, cont to monitor -does eat healthily out of his garden  6. Atherosclerosis of native coronary artery of native heart without angina pectoris -stable, no symptoms, no changes to regimen  7. Need for immunization against influenza - Flu Vaccine QUAD 36+ mos PF IM (Fluarix & Fluzone Quad PF)   Labs/tests ordered:   Orders Placed This Encounter  Procedures  . Flu Vaccine QUAD 36+ mos PF IM (Fluarix & Fluzone Quad PF)    Next appt:  03/03/2016   Mayley Lish L. Marco Adelson, D.O. [facility] [address], [place] [address] Cell Phone (Mon-Fri 8am-5pm):  [phone] On Call:  [phone] & follow prompts after 5pm & weekends Office Phone:  [phone] Office Fax:  [phone]

## 2015-11-30 NOTE — Patient Instructions (Signed)
Take lasix and potassium for 3 days for your swelling. If it does not get better after that, give me a call so we can evaluate more.   Please do the hemoccult test of your stool and bring it by the lab.

## 2015-12-04 ENCOUNTER — Other Ambulatory Visit: Payer: Self-pay | Admitting: *Deleted

## 2015-12-04 ENCOUNTER — Other Ambulatory Visit: Payer: Commercial Managed Care - HMO

## 2015-12-04 DIAGNOSIS — R634 Abnormal weight loss: Secondary | ICD-10-CM

## 2015-12-05 LAB — FECAL OCCULT BLOOD, IMMUNOCHEMICAL: Fecal Occult Blood: POSITIVE — AB

## 2015-12-07 ENCOUNTER — Other Ambulatory Visit: Payer: Self-pay | Admitting: Internal Medicine

## 2015-12-07 DIAGNOSIS — R634 Abnormal weight loss: Secondary | ICD-10-CM

## 2015-12-07 DIAGNOSIS — R195 Other fecal abnormalities: Secondary | ICD-10-CM

## 2015-12-07 NOTE — Progress Notes (Signed)
Needs GI referral due to heme positive stools, weight loss, decreased appetite/po intake.

## 2015-12-12 ENCOUNTER — Encounter: Payer: Self-pay | Admitting: Internal Medicine

## 2015-12-24 ENCOUNTER — Other Ambulatory Visit: Payer: Self-pay | Admitting: Internal Medicine

## 2016-01-09 ENCOUNTER — Telehealth: Payer: Self-pay | Admitting: *Deleted

## 2016-01-09 DIAGNOSIS — M1712 Unilateral primary osteoarthritis, left knee: Secondary | ICD-10-CM

## 2016-01-09 NOTE — Telephone Encounter (Signed)
Patient called and stated that he needs a referral to [facility] [phone] for knee pain. He has already made his appointment for Tuesday. Please Advise.

## 2016-01-10 NOTE — Telephone Encounter (Signed)
This is ok, but often this is more expensive than usual therapy for knee arthritis and seems better indicated for young, healthy athletes with knee problems.

## 2016-01-11 NOTE — Telephone Encounter (Signed)
Patient stated that he still wants to go and has an appointment scheduled for Tuesday.

## 2016-01-14 ENCOUNTER — Encounter: Payer: Self-pay | Admitting: Internal Medicine

## 2016-01-14 ENCOUNTER — Ambulatory Visit (INDEPENDENT_AMBULATORY_CARE_PROVIDER_SITE_OTHER): Payer: Commercial Managed Care - HMO | Admitting: Internal Medicine

## 2016-01-14 VITALS — BP 138/60 | HR 88 | Temp 98.2°F | Wt 167.0 lb

## 2016-01-14 DIAGNOSIS — R195 Other fecal abnormalities: Secondary | ICD-10-CM

## 2016-01-14 DIAGNOSIS — I2510 Atherosclerotic heart disease of native coronary artery without angina pectoris: Secondary | ICD-10-CM | POA: Diagnosis not present

## 2016-01-14 DIAGNOSIS — R739 Hyperglycemia, unspecified: Secondary | ICD-10-CM | POA: Diagnosis not present

## 2016-01-14 DIAGNOSIS — N183 Chronic kidney disease, stage 3 unspecified: Secondary | ICD-10-CM

## 2016-01-14 DIAGNOSIS — I10 Essential (primary) hypertension: Secondary | ICD-10-CM

## 2016-01-14 DIAGNOSIS — E1122 Type 2 diabetes mellitus with diabetic chronic kidney disease: Secondary | ICD-10-CM

## 2016-01-14 DIAGNOSIS — D472 Monoclonal gammopathy: Secondary | ICD-10-CM | POA: Diagnosis not present

## 2016-01-14 DIAGNOSIS — Z599 Problem related to housing and economic circumstances, unspecified: Secondary | ICD-10-CM

## 2016-01-14 DIAGNOSIS — R634 Abnormal weight loss: Secondary | ICD-10-CM | POA: Diagnosis not present

## 2016-01-14 LAB — CBC WITH DIFFERENTIAL/PLATELET
Basophils Absolute: 0 cells/uL (ref 0–200)
Basophils Relative: 0 %
Eosinophils Absolute: 132 cells/uL (ref 15–500)
Eosinophils Relative: 2 %
HCT: 36.8 % — ABNORMAL LOW (ref 38.5–50.0)
Hemoglobin: 12.2 g/dL — ABNORMAL LOW (ref 13.2–17.1)
Lymphocytes Relative: 20 %
Lymphs Abs: 1320 cells/uL (ref 850–3900)
MCH: 30.3 pg (ref 27.0–33.0)
MCHC: 33.2 g/dL (ref 32.0–36.0)
MCV: 91.3 fL (ref 80.0–100.0)
MPV: 10 fL (ref 7.5–12.5)
Monocytes Absolute: 594 cells/uL (ref 200–950)
Monocytes Relative: 9 %
Neutro Abs: 4554 cells/uL (ref 1500–7800)
Neutrophils Relative %: 69 %
Platelets: 413 10*3/uL — ABNORMAL HIGH (ref 140–400)
RBC: 4.03 MIL/uL — ABNORMAL LOW (ref 4.20–5.80)
RDW: 14.6 % (ref 11.0–15.0)
WBC: 6.6 10*3/uL (ref 3.8–10.8)

## 2016-01-14 LAB — BASIC METABOLIC PANEL
BUN: 21 mg/dL (ref 7–25)
CO2: 26 mmol/L (ref 20–31)
Calcium: 10.1 mg/dL (ref 8.6–10.3)
Chloride: 107 mmol/L (ref 98–110)
Creat: 1.27 mg/dL — ABNORMAL HIGH (ref 0.70–1.11)
Glucose, Bld: 86 mg/dL (ref 65–99)
Potassium: 4.7 mmol/L (ref 3.5–5.3)
Sodium: 141 mmol/L (ref 135–146)

## 2016-01-14 LAB — HEPATIC FUNCTION PANEL
ALT: 28 U/L (ref 9–46)
AST: 32 U/L (ref 10–35)
Albumin: 4 g/dL (ref 3.6–5.1)
Alkaline Phosphatase: 72 U/L (ref 40–115)
Bilirubin, Direct: 0.1 mg/dL (ref <=0.2)
Indirect Bilirubin: 0.2 mg/dL (ref 0.2–1.2)
Total Bilirubin: 0.3 mg/dL (ref 0.2–1.2)
Total Protein: 7.2 g/dL (ref 6.1–8.1)

## 2016-01-14 NOTE — Addendum Note (Signed)
Addended by: Gayland Curry on: 01/14/2016 03:21 PM   Modules accepted: Orders

## 2016-01-14 NOTE — Telephone Encounter (Signed)
Patient called triage line requesting status of referral. Please place referral, patient has pending appointment tomorrow

## 2016-01-14 NOTE — Progress Notes (Signed)
Location:  [facility] clinic Provider: Aundreya Souffrant L. Mariea Clonts, D.O., C.M.D.  Code Status: DNR Goals of Care:  Advanced Directives 01/14/2016  Does patient have an advance directive? Yes  Type of Advance Directive -  Does patient want to make changes to advanced directive? -  Copy of advanced directive(s) in chart? No - copy requested  Would patient like information on creating an advanced directive? -     Chief Complaint  Patient presents with  . Acute Visit    weight loss    HPI: Patient is a 82 y.o. Schwartz seen today for an acute visit for weight loss.  I've been concerned about this for some time now.  He had weighed 185.Kevin lbs in June 2016, but weight has trended down for the most part since with today's weight being 167 lbs.   FOBT was positive Kevin/22/17.  He reports not seeing any blood or dark stools.  We discussed this at his last visit when I recommended he see GI for further evaluation.  Appt is not until 11/7 at Kevin:45am with Dr. Gaylord Shih ask referral coordinator to try to move this up.  Pt's sister is here with him today and notifies me that he has been living in a hotel for several months which has affected his po intake (apparentlly he's eating 1-2 meals at her house some days though--he says just one).  He apparently only eats 2 meals.  I've given him boost supplements to drink to help with this, but he had kept the fact that he lost his home a secret.  He has been taking all of his meds, he says.       Past Medical History:  Diagnosis Date  . CAD (coronary artery disease)    PTCA of OM in 1998, BMS OM in 2000, Cath 9/07 LM ok LAD ok. LCX 95% in OM prior to previous stent RCA. nondominant normal EF normal. Cypher DES to OM 2007 (PLACED PROXIAMAL TO PREVIOUS STENT)  . Chronic kidney disease, stage III (moderate)   . Chronic kidney disease, unspecified   . Coronary atherosclerosis of unspecified type of vessel, native or graft   . Depression   . DM type 2 (diabetes mellitus, type 2)  ([place])   . DM type 2 causing CKD stage 2 ([place]) 08/01/2011  . Gout, unspecified   . HLD (hyperlipidemia)   . HTN (hypertension)   . Loss of weight   . Memory loss   . MGUS (monoclonal gammopathy of unknown significance) 08/01/2011   IgG kappa dx 2002 Kevin% plasma cells in bone marrow; no lesions on bone X-rays;  [name] Monoclonal paraproteinemia   . Osteoarthrosis, unspecified whether generalized or localized, unspecified site   . Other atopic dermatitis and related conditions   . Peripheral vascular disease, unspecified   . Personality change due to conditions classified elsewhere   . PVD (peripheral vascular disease) ([place])   . Renal insufficiency   . S/P coronary artery stent placement 08/01/2011  . Spasm of muscle   . Thyroid disease   . Unspecified disorder of kidney and ureter   . Unspecified hearing loss   . Urinary tract infection, site not specified     Past Surgical History:  Procedure Laterality Date  . CAROTID STENT    . CORONARY ANGIOPLASTY    . JOINT REPLACEMENT     left knee  . KNEE ARTHROPLASTY     total    Allergies  Allergen Reactions  . Ace Inhibitors Swelling  angioedema  . Losartan Potassium Swelling    Angioedema   . Crestor [Rosuvastatin] Swelling      Medication List       Accurate as of 01/14/16 11:40 AM. Always use your most recent med list.          allopurinol 100 MG tablet Commonly known as:  ZYLOPRIM Take 1 tablet (100 mg total) by mouth daily.   amLODipine 2.5 MG tablet Commonly known as:  NORVASC Take 2 tablets (5 mg total) by mouth daily.   aspirin 81 MG tablet Take 1 tablet (81 mg total) by mouth daily.   atorvastatin 80 MG tablet Commonly known as:  LIPITOR Take 1 tablet (80 mg total) by mouth daily. For High Cholesterol   citalopram 20 MG tablet Commonly known as:  CELEXA Take 1 tablet (20 mg total) by mouth daily.   furosemide 40 MG tablet Commonly known as:  LASIX TAKE 1 TABLET DAILY FOR 3 DAYS AS DIRECTED     metFORMIN 500 MG tablet Commonly known as:  GLUCOPHAGE Take one tablet by mouth once daily with breakfast to control blood sugar   metoprolol 50 MG tablet Commonly known as:  LOPRESSOR Take one tablet by mouth twice daily for blood pressure   nitroGLYCERIN 0.4 MG SL tablet Commonly known as:  NITROSTAT Place 0.4 mg under the tongue every 5 (five) minutes as needed for chest pain. Up to 3 doses   ONE TOUCH ULTRA TEST test strip Generic drug:  glucose blood   potassium chloride SA 20 MEQ tablet Commonly known as:  K-DUR,KLOR-CON TAKE 1 TABLET DAILY FOR 3 DAYS AS DIRECTED   ULTRA-THIN II MINI PEN NEEDLE 31G X 5 MM Misc Generic drug:  Insulin Pen Needle       Review of Systems:  Review of Systems  Constitutional: Positive for weight loss. Negative for chills, fever and malaise/fatigue.  HENT: Positive for hearing loss.   Eyes: Negative for blurred vision.       Glasses  Respiratory: Negative for shortness of breath.   Cardiovascular: Negative for chest pain, palpitations and leg swelling.  Gastrointestinal: Negative for abdominal pain, constipation, diarrhea, heartburn, nausea and vomiting.       Heme positive stool  Genitourinary: Negative for dysuria, flank pain, frequency, hematuria and urgency.  Musculoskeletal: Positive for joint pain. Negative for falls.       Knee giving out sometimes  Skin: Negative for itching and rash.  Neurological: Negative for dizziness, loss of consciousness and weakness.  Endo/Heme/Allergies: Does not bruise/bleed easily.  Psychiatric/Behavioral: Negative for depression and memory loss. The patient is not nervous/anxious and does not have insomnia.     Health Maintenance  Topic Date Due  . TETANUS/TDAP  12/16/1949  . ZOSTAVAX  12/15/1992  . OPHTHALMOLOGY EXAM  04/14/2014  . URINE MICROALBUMIN  03/07/2015  . HEMOGLOBIN A1C  10/28/2015  . FOOT EXAM  11/29/2016  . INFLUENZA VACCINE  Completed  . PNA vac Low Risk Adult  Completed     Physical Exam: Vitals:   01/14/16 1116  BP: 138/60  Pulse: 88  Temp: 98.2 F (36.Kevin C)  TempSrc: Oral  SpO2: 98%  Weight: 167 lb (75.Kevin kg)   Body mass index is 26.95 kg/m. Physical Exam  Constitutional: He is oriented to person, place, and time. No distress.  Thin black Schwartz  HENT:  Head: Normocephalic and atraumatic.  Cardiovascular: Normal rate, regular rhythm, normal heart sounds and intact distal pulses.   Pulmonary/Chest: Effort normal and breath  sounds normal. No respiratory distress.  Abdominal: Soft. Bowel sounds are normal. He exhibits no distension and no mass. There is no tenderness. There is no rebound and no guarding. No hernia.  Musculoskeletal:  Walks with limp  Neurological: He is alert and oriented to person, place, and time. He exhibits normal muscle tone. Coordination normal.  Skin: Skin is warm and dry.  Psychiatric:  Flat affect, difficulty with eye contact today after his sister's report    Labs reviewed: Basic Metabolic Panel:  Recent Labs  04/30/15 1017  NA 143  K 4.3  CL 103  CO2 22  GLUCOSE 130*  BUN 21  CREATININE 1.63*  CALCIUM 9.4   Liver Function Tests:  Recent Labs  04/30/15 1017 07/30/15 0919  AST 24  --   ALT 23  --   ALKPHOS 73  --   BILITOT 0.3  --   PROT 7.4 7.5  ALBUMIN 4.1  --    No results for input(s): LIPASE, AMYLASE in the last 8760 hours. No results for input(s): AMMONIA in the last 8760 hours. CBC:  Recent Labs  04/30/15 1017 07/30/15 0919  WBC 6.9 7.3  NEUTROABS 4.4 4.Kevin  HCT 39.Kevin 38.Kevin  MCV 91 93  PLT 442* 382*   Lipid Panel:  Recent Labs  04/30/15 1017  CHOL 244*  HDL 55  LDLCALC 164*  TRIG 123  CHOLHDL 4.4   Lab Results  Component Value Date   HGBA1C 5.Kevin (H) 04/30/2015    Assessment/Plan 1. Loss of weight - unclear if this is due to a GI malignancy or gastritis picture causing the heme positive stool also OR if he has lost weight due to losing his home and not eating enough due  to cost (eating only 2 meals per day and that's by report) - CBC with Differential/Platelet - Basic metabolic panel - Hemoglobin A1c - Hepatic Function Panel - Ambulatory referral to [facility] to assist with financial issues/find him a place to stay  2. Heme positive stool -noted on fobt test -await GI appt for further eval--he's been asymptomatic outside of his dramatic weight loss  3. Chronic kidney disease, stage III (moderate) -again discussed meds to avoid and hydration -already retested for myeloma with spep/upep  4. Essential hypertension -bp at goal for age, no changes needed  5. Controlled type 2 diabetes mellitus with stage 3 chronic kidney disease, without long-term current use of insulin (HCC) -had allergy to ace, arb; on hydralazine instead -cont current regular diet--needs to eat 3 meals at least though  6. Atherosclerosis of native coronary artery of native heart without angina pectoris -stable on current regimen, no symptoms, monitor  7. MGUS (monoclonal gammopathy of unknown significance) -recheck of labs was negative this year for myeloma, monitor  Kevin. Housing problems - see hpi and referral itself--pt living in hotel - Ambulatory referral to [facility]  Labs/tests ordered:   Orders Placed This Encounter  Procedures  . CBC with Differential/Platelet  . Basic metabolic panel  . Hemoglobin A1c  . Hepatic Function Panel  . Ambulatory referral to [facility]    Referral Priority:   Urgent    Referral Type:   Consultation    Referral Reason:   Specialty Services Required    Number of Visits Requested:   1    Next appt:  03/03/2016 to f/u on wt loss  Roger Kettles L. Rohn Fritsch, D.O. [facility] [address], [place] [address] Cell Phone (  Mon-Fri 8am-5pm):  [phone] On Call:  [phone] & follow prompts after 5pm & weekends Office Phone:  [phone] Office Fax:  [phone]

## 2016-01-15 LAB — HEMOGLOBIN A1C
Hgb A1c MFr Bld: 5.4 % (ref <5.7)
Mean Plasma Glucose: 108 mg/dL

## 2016-01-17 ENCOUNTER — Encounter: Payer: Self-pay | Admitting: *Deleted

## 2016-01-17 ENCOUNTER — Telehealth: Payer: Self-pay | Admitting: Internal Medicine

## 2016-01-17 NOTE — Telephone Encounter (Signed)
left message for pt to call me about community resources. VDM (dee-dee)

## 2016-01-21 DIAGNOSIS — R195 Other fecal abnormalities: Secondary | ICD-10-CM | POA: Insufficient documentation

## 2016-01-21 DIAGNOSIS — R634 Abnormal weight loss: Secondary | ICD-10-CM | POA: Insufficient documentation

## 2016-01-24 ENCOUNTER — Ambulatory Visit (INDEPENDENT_AMBULATORY_CARE_PROVIDER_SITE_OTHER): Payer: Commercial Managed Care - HMO | Admitting: Nurse Practitioner

## 2016-01-24 ENCOUNTER — Encounter: Payer: Self-pay | Admitting: Nurse Practitioner

## 2016-01-24 VITALS — BP 140/70 | HR 68 | Ht 63.5 in | Wt 167.0 lb

## 2016-01-24 DIAGNOSIS — R195 Other fecal abnormalities: Secondary | ICD-10-CM

## 2016-01-24 NOTE — Progress Notes (Signed)
HPI: Patient is an 82 year old male with a past medical history significant for DM 2, CKD, hypertension, CAD status post multiple PCI's and MGUS. He is referred by PCP Dr. Hollace Kinnier for evaluation of positive IFOB. Patient has lost unexpectedly about 10 pounds over the last 3 months. His appetite is not as good as it used to be. Hgb stable around 12. No family history of colon cancer. Patient had a colonoscopy 15 or so years ago. He has no overt bleeding. No abdominal pain or bowel changes.    Past Medical History:  Diagnosis Date  . CAD (coronary artery disease)    PTCA of OM in 1998, BMS OM in 2000, Cath 9/07 LM ok LAD ok. LCX 95% in OM prior to previous stent RCA. nondominant normal EF normal. Cypher DES to OM 2007 (PLACED PROXIAMAL TO PREVIOUS STENT)  . Chronic kidney disease, stage III (moderate)   . Chronic kidney disease, unspecified   . Coronary atherosclerosis of unspecified type of vessel, native or graft   . Depression   . DM type 2 (diabetes mellitus, type 2) ([place])   . DM type 2 causing CKD stage 2 ([place]) 08/01/2011  . Gout, unspecified   . HLD (hyperlipidemia)   . HTN (hypertension)   . Loss of weight   . Memory loss   . MGUS (monoclonal gammopathy of unknown significance) 08/01/2011   IgG kappa dx 2002 8% plasma cells in bone marrow; no lesions on bone X-rays;  [name] Monoclonal paraproteinemia   . Osteoarthrosis, unspecified whether generalized or localized, unspecified site   . Other atopic dermatitis and related conditions   . Peripheral vascular disease, unspecified   . Personality change due to conditions classified elsewhere   . PVD (peripheral vascular disease) ([place])   . Renal insufficiency   . S/P coronary artery stent placement 08/01/2011  . Spasm of muscle   . Thyroid disease   . Unspecified disorder of kidney and ureter   . Unspecified hearing loss   . Urinary tract infection, site not specified      Past Surgical History:  Procedure Laterality Date    . CAROTID STENT    . CORONARY ANGIOPLASTY    . TOTAL KNEE ARTHROPLASTY Left    Family History  Problem Relation Age of Onset  . Heart disease Mother   . Heart disease Sister   . Heart disease Sister   . Cancer Sister     type unknown  . Hypertension Sister   . Diabetes Brother   . Diabetes Brother   . Diabetes    . Coronary artery disease    . Cancer     Social History  Substance Use Topics  . Smoking status: Current Every Day Smoker  . Smokeless tobacco: Never Used  . Alcohol use No   Current Outpatient Prescriptions  Medication Sig Dispense Refill  . allopurinol (ZYLOPRIM) 100 MG tablet Take 1 tablet (100 mg total) by mouth daily. 90 tablet 3  . amLODipine (NORVASC) 2.5 MG tablet Take 2 tablets (5 mg total) by mouth daily. 180 tablet 3  . aspirin 81 MG tablet Take 1 tablet (81 mg total) by mouth daily. 90 tablet 1  . atorvastatin (LIPITOR) 80 MG tablet Take 1 tablet (80 mg total) by mouth daily. For High Cholesterol 90 tablet 3  . citalopram (CELEXA) 20 MG tablet Take 1 tablet (20 mg total) by mouth daily. 90 tablet 3  . furosemide (LASIX) 40 MG tablet TAKE 1  TABLET DAILY FOR 3 DAYS AS DIRECTED 30 tablet 6  . metFORMIN (GLUCOPHAGE) 500 MG tablet Take one tablet by mouth once daily with breakfast to control blood sugar 90 tablet 3  . metoprolol (LOPRESSOR) 50 MG tablet Take one tablet by mouth twice daily for blood pressure 180 tablet 3  . ONE TOUCH ULTRA TEST test strip     . potassium chloride SA (K-DUR,KLOR-CON) 20 MEQ tablet TAKE 1 TABLET DAILY FOR 3 DAYS AS DIRECTED 30 tablet 6  . ULTRA-THIN II MINI PEN NEEDLE 31G X 5 MM MISC     . nitroGLYCERIN (NITROSTAT) 0.4 MG SL tablet Place 0.4 mg under the tongue every 5 (five) minutes as needed for chest pain. Up to 3 doses     No current facility-administered medications for this visit.    Allergies  Allergen Reactions  . Ace Inhibitors Swelling    angioedema  . Losartan Potassium Swelling    Angioedema   . Crestor  [Rosuvastatin] Swelling     Review of Systems: All systems reviewed and negative except where noted in HPI.    Physical Exam: BP 140/70 (BP Location: Left Arm, Patient Position: Sitting, Cuff Size: Normal)   Pulse 68 Comment: rregular  Ht 5' 3.5" (1.613 m) Comment: height measured without shoes  Wt 167 lb (75.8 kg)   BMI 29.12 kg/m  Constitutional: Pleasant,well-developed, male in no acute distress. HEENT: Normocephalic and atraumatic. Conjunctivae are normal. No scleral icterus. Neck supple.  Cardiovascular: Normal rate, regular rhythm.  Pulmonary/chest: Effort normal and breath sounds normal. No wheezing, rales or rhonchi. Abdominal: Soft, nondistended, nontender. Bowel sounds active throughout. There are no masses palpable. No hepatomegaly. Extremities: no edema Lymphadenopathy: No cervical adenopathy noted. Neurological: Alert and oriented to person place and time. Skin: Skin is warm and dry. No rashes noted. Psychiatric: Normal mood and affect. Behavior is normal.   ASSESSMENT AND PLAN:  82 year old male referred for positive IFOB, mild unexplained weight loss. Patient looks exceptionally well for his age and multiple medical problems. He is interested in pursuing a colonoscopy. The risks and benefits of colonoscopy with possible polypectomy were discussed and the patient agrees to proceed.    Reed, Tiffany L, DO

## 2016-01-24 NOTE — Patient Instructions (Signed)
You have been scheduled for a colonoscopy. Please follow written instructions given to you at your visit today.  We have given you a prep sample for the colonoscopy. If you use inhalers (even only as needed), please bring them with you on the day of your procedure. Your physician has requested that you go to www.startemmi.com and enter the access code given to you at your visit today. This web site gives a general overview about your procedure. However, you should still follow specific instructions given to you by our office regarding your preparation for the procedure.

## 2016-01-28 ENCOUNTER — Encounter: Payer: Self-pay | Admitting: Internal Medicine

## 2016-01-28 ENCOUNTER — Ambulatory Visit (INDEPENDENT_AMBULATORY_CARE_PROVIDER_SITE_OTHER): Payer: Commercial Managed Care - HMO | Admitting: Sports Medicine

## 2016-01-28 NOTE — Progress Notes (Signed)
Agree with initial assessment and plans 

## 2016-02-07 ENCOUNTER — Telehealth: Payer: Self-pay

## 2016-02-07 DIAGNOSIS — T783XXA Angioneurotic edema, initial encounter: Secondary | ICD-10-CM | POA: Diagnosis not present

## 2016-02-07 DIAGNOSIS — R809 Proteinuria, unspecified: Secondary | ICD-10-CM | POA: Diagnosis not present

## 2016-02-07 DIAGNOSIS — D472 Monoclonal gammopathy: Secondary | ICD-10-CM | POA: Diagnosis not present

## 2016-02-07 DIAGNOSIS — E1121 Type 2 diabetes mellitus with diabetic nephropathy: Secondary | ICD-10-CM | POA: Diagnosis not present

## 2016-02-07 DIAGNOSIS — I150 Renovascular hypertension: Secondary | ICD-10-CM | POA: Diagnosis not present

## 2016-02-07 DIAGNOSIS — N183 Chronic kidney disease, stage 3 (moderate): Secondary | ICD-10-CM | POA: Diagnosis not present

## 2016-02-07 NOTE — Telephone Encounter (Signed)
No he should not be taking those medications.

## 2016-02-07 NOTE — Telephone Encounter (Signed)
I spoke with a representative from [facility] to inform them that patient should not be taking omeprazole or omega 3. Representative verbalized understanding and stated

## 2016-02-07 NOTE — Telephone Encounter (Signed)
Received a call from [facility] on behalf of the patient. The representative stated that patient asked them to fax Dr. Mariea Clonts to see if patient needed to be taking omeprazole and omega 3 capsules?   I did not see these medications on his list.   Please advise.

## 2016-02-07 NOTE — Telephone Encounter (Signed)
That they would inform the patient.

## 2016-02-08 ENCOUNTER — Ambulatory Visit (AMBULATORY_SURGERY_CENTER): Payer: Commercial Managed Care - HMO | Admitting: Internal Medicine

## 2016-02-08 ENCOUNTER — Encounter: Payer: Self-pay | Admitting: Internal Medicine

## 2016-02-08 VITALS — BP 89/39 | HR 80 | Temp 98.4°F | Resp 16 | Ht 63.5 in | Wt 167.0 lb

## 2016-02-08 DIAGNOSIS — R195 Other fecal abnormalities: Secondary | ICD-10-CM | POA: Diagnosis present

## 2016-02-08 DIAGNOSIS — D122 Benign neoplasm of ascending colon: Secondary | ICD-10-CM

## 2016-02-08 DIAGNOSIS — Z1211 Encounter for screening for malignant neoplasm of colon: Secondary | ICD-10-CM | POA: Diagnosis not present

## 2016-02-08 LAB — GLUCOSE, CAPILLARY
Glucose-Capillary: 50 mg/dL — ABNORMAL LOW (ref 65–99)
Glucose-Capillary: 51 mg/dL — ABNORMAL LOW (ref 65–99)
Glucose-Capillary: 109 mg/dL — ABNORMAL HIGH (ref 65–99)

## 2016-02-08 MED ORDER — SODIUM CHLORIDE 0.9 % IV SOLN: 500.0000 mL | INTRAVENOUS | Status: DC

## 2016-02-08 NOTE — Progress Notes (Signed)
Glucose rechecked - 50, D5 infusing.

## 2016-02-08 NOTE — Op Note (Signed)
[facility] Patient Name: Kevin Schwartz Procedure Date: 02/08/2016 3:21 PM MRN: [id number] Endoscopist: Docia Chuck. Henrene Pastor , MD Age: 82 Referring MD:  Date of Birth: 08/31/1930 Gender: Male Account #: [id number] Procedure:                Colonoscopy, with cold snare polypectomy x 1 Indications:              Heme positive stool Medicines:                Monitored Anesthesia Care Procedure:                Pre-Anesthesia Assessment:                           - Prior to the procedure, a History and Physical                            was performed, and patient medications and                            allergies were reviewed. The patient's tolerance of                            previous anesthesia was also reviewed. The risks                            and benefits of the procedure and the sedation                            options and risks were discussed with the patient.                            All questions were answered, and informed consent                            was obtained. Prior Anticoagulants: The patient has                            taken no previous anticoagulant or antiplatelet                            agents. ASA Grade Assessment: II - A patient with                            mild systemic disease. After reviewing the risks                            and benefits, the patient was deemed in                            satisfactory condition to undergo the procedure.                           After obtaining informed consent, the colonoscope  was passed under direct vision. Throughout the                            procedure, the patient's blood pressure, pulse, and                            oxygen saturations were monitored continuously. The                            Model CF-HQ190L [id number]) scope was introduced                            through the anus and advanced to the the cecum,                            identified  by appendiceal orifice and ileocecal                            valve. The ileocecal valve, appendiceal orifice,                            and rectum were photographed. The quality of the                            bowel preparation was excellent. The colonoscopy                            was performed without difficulty. The patient                            tolerated the procedure well. The bowel preparation                            used was SUPREP. Scope In: 3:33:45 PM Scope Out: 3:45:26 PM Scope Withdrawal Time: 0 hours 8 minutes 57 seconds  Total Procedure Duration: 0 hours 11 minutes 41 seconds  Findings:                 A 3 mm polyp was found in the ascending colon. The                            polyp was removed with a cold snare. Resection and                            retrieval were complete.                           Multiple diverticula were found in the entire colon.                           Internal hemorrhoids were found during retroflexion.                           The exam was otherwise without abnormality on  direct and retroflexion views. Complications:            No immediate complications. Estimated blood loss:                            None. Estimated Blood Loss:     Estimated blood loss: none. Impression:               - One 3 mm polyp in the ascending colon, removed                            with a cold snare. Resected and retrieved.                           - Diverticulosis in the entire examined colon.                           - Internal hemorrhoids.                           - The examination was otherwise normal on direct                            and retroflexion views. Recommendation:           - Repeat colonoscopy is not recommended for                            surveillance.                           - Patient has a contact number available for                            emergencies. The signs and symptoms of  potential                            delayed complications were discussed with the                            patient. Return to normal activities tomorrow.                            Written discharge instructions were provided to the                            patient.                           - Resume previous diet.                           - Continue present medications.                           - Await pathology results.                           -  Return to the care of your primary provider Docia Chuck. Henrene Pastor, MD 02/08/2016 3:50:01 PM This report has been signed electronically. CC Letter to:             Tiffany L. Reed, D.O.

## 2016-02-08 NOTE — Progress Notes (Signed)
A and O x3. Report to RN. Tolerated MAC anesthesia well. 

## 2016-02-08 NOTE — Progress Notes (Signed)
Glucose 51 on arrival to unit.  IV started with D5 to run continuosly. Pt is asymptomatic.  Just states that he is hungry.

## 2016-02-08 NOTE — Progress Notes (Signed)
Called to room to assist during endoscopic procedure.  Patient ID and intended procedure confirmed with present staff. Received instructions for my participation in the procedure from the performing physician.  

## 2016-02-08 NOTE — Patient Instructions (Signed)
Impression/Recommendations:  Diverticulosis handout given to patient. Hemorrhoid handout given to patient.  YOU HAD AN ENDOSCOPIC PROCEDURE TODAY AT [facility]:   Refer to the procedure report that was given to you for any specific questions about what was found during the examination.  If the procedure report does not answer your questions, please call your gastroenterologist to clarify.  If you requested that your care partner not be given the details of your procedure findings, then the procedure report has been included in a sealed envelope for you to review at your convenience later.  YOU SHOULD EXPECT: Some feelings of bloating in the abdomen. Passage of more gas than usual.  Walking can help get rid of the air that was put into your GI tract during the procedure and reduce the bloating. If you had a lower endoscopy (such as a colonoscopy or flexible sigmoidoscopy) you may notice spotting of blood in your stool or on the toilet paper. If you underwent a bowel prep for your procedure, you may not have a normal bowel movement for a few days.  Please Note:  You might notice some irritation and congestion in your nose or some drainage.  This is from the oxygen used during your procedure.  There is no need for concern and it should clear up in a day or so.  SYMPTOMS TO REPORT IMMEDIATELY:   Following lower endoscopy (colonoscopy or flexible sigmoidoscopy):  Excessive amounts of blood in the stool  Significant tenderness or worsening of abdominal pains  Swelling of the abdomen that is new, acute  Fever of 100F or higher  FFor urgent or emergent issues, a gastroenterologist can be reached at any hour by calling [phone].   DIET:  We do recommend a small meal at first, but then you may proceed to your regular diet.  Drink plenty of fluids but you should avoid alcoholic beverages for 24 hours.  ACTIVITY:  You should plan to take it easy for the rest of today and you  should NOT DRIVE or use heavy machinery until tomorrow (because of the sedation medicines used during the test).    FOLLOW UP: Our staff will call the number listed on your records the next business day following your procedure to check on you and address any questions or concerns that you may have regarding the information given to you following your procedure. If we do not reach you, we will leave a message.  However, if you are feeling well and you are not experiencing any problems, there is no need to return our call.  We will assume that you have returned to your regular daily activities without incident.  If any biopsies were taken you will be contacted by phone or by letter within the next 1-3 weeks.  Please call us at [phone] if you have not heard about the biopsies in 3 weeks.    SIGNATURES/CONFIDENTIALITY: You and/or your care partner have signed paperwork which will be entered into your electronic medical record.  These signatures attest to the fact that that the information above on your After Visit Summary has been reviewed and is understood.  Full responsibility of the confidentiality of this discharge information lies with you and/or your care-partner.

## 2016-02-11 ENCOUNTER — Telehealth: Payer: Self-pay

## 2016-02-11 NOTE — Telephone Encounter (Signed)
  Follow up Call-  Call back number 02/08/2016  Post procedure Call Back phone  # [phone]  Permission to leave phone message Yes  Some recent data might be hidden    Patient was called for follow up after his procedure on 02/08/2016. No answer at the number given for follow up phone call. A message was left on the answering machine.

## 2016-02-11 NOTE — Telephone Encounter (Signed)
  Follow up Call-  Call back number 02/08/2016  Post procedure Call Back phone  # [phone]  Permission to leave phone message Yes  Some recent data might be hidden    Patient was called for follow up after his procedure on 02/08/2016. No answer at the number given for follow up phone call. A message was left on the answering machine. This was the second attempt to contact the patient.

## 2016-02-12 ENCOUNTER — Telehealth: Payer: Self-pay

## 2016-02-12 NOTE — Telephone Encounter (Signed)
  Follow up Call-  Call back number 02/08/2016  Post procedure Call Back phone  # [phone]  Permission to leave phone message Yes  Some recent data might be hidden     Patient questions:  Do you have a fever, pain , or abdominal swelling? No. Pain Score  0 *  Have you tolerated food without any problems? Yes.    Have you been able to return to your normal activities? Yes.    Do you have any questions about your discharge instructions: Diet   No. Medications  No. Follow up visit  No.  Do you have questions or concerns about your Care? No.  Actions: * If pain score is 4 or above: No action needed, pain <4.

## 2016-02-13 ENCOUNTER — Encounter: Payer: Self-pay | Admitting: Internal Medicine

## 2016-02-13 DIAGNOSIS — M1711 Unilateral primary osteoarthritis, right knee: Secondary | ICD-10-CM | POA: Diagnosis not present

## 2016-02-13 DIAGNOSIS — M25561 Pain in right knee: Secondary | ICD-10-CM | POA: Diagnosis not present

## 2016-02-13 DIAGNOSIS — T8484XA Pain due to internal orthopedic prosthetic devices, implants and grafts, initial encounter: Secondary | ICD-10-CM | POA: Diagnosis not present

## 2016-02-19 ENCOUNTER — Ambulatory Visit: Payer: Self-pay | Admitting: Internal Medicine

## 2016-02-21 ENCOUNTER — Telehealth: Payer: Self-pay | Admitting: Internal Medicine

## 2016-02-21 DIAGNOSIS — M1711 Unilateral primary osteoarthritis, right knee: Secondary | ICD-10-CM | POA: Diagnosis not present

## 2016-02-21 DIAGNOSIS — M25561 Pain in right knee: Secondary | ICD-10-CM | POA: Diagnosis not present

## 2016-02-21 DIAGNOSIS — M25562 Pain in left knee: Secondary | ICD-10-CM | POA: Diagnosis not present

## 2016-02-21 DIAGNOSIS — R262 Difficulty in walking, not elsewhere classified: Secondary | ICD-10-CM | POA: Diagnosis not present

## 2016-02-21 NOTE — Telephone Encounter (Signed)
left msg asking pt to confirm this AWV appt w/ nurse. VDM (DD) °

## 2016-03-03 ENCOUNTER — Encounter: Payer: Self-pay | Admitting: Internal Medicine

## 2016-03-03 ENCOUNTER — Ambulatory Visit (INDEPENDENT_AMBULATORY_CARE_PROVIDER_SITE_OTHER): Payer: Commercial Managed Care - HMO | Admitting: Internal Medicine

## 2016-03-03 VITALS — BP 140/70 | HR 66 | Temp 98.0°F | Wt 165.0 lb

## 2016-03-03 DIAGNOSIS — K5730 Diverticulosis of large intestine without perforation or abscess without bleeding: Secondary | ICD-10-CM | POA: Diagnosis not present

## 2016-03-03 DIAGNOSIS — I10 Essential (primary) hypertension: Secondary | ICD-10-CM

## 2016-03-03 DIAGNOSIS — E782 Mixed hyperlipidemia: Secondary | ICD-10-CM

## 2016-03-03 DIAGNOSIS — E1122 Type 2 diabetes mellitus with diabetic chronic kidney disease: Secondary | ICD-10-CM

## 2016-03-03 DIAGNOSIS — K648 Other hemorrhoids: Secondary | ICD-10-CM | POA: Diagnosis not present

## 2016-03-03 DIAGNOSIS — I2510 Atherosclerotic heart disease of native coronary artery without angina pectoris: Secondary | ICD-10-CM

## 2016-03-03 DIAGNOSIS — R634 Abnormal weight loss: Secondary | ICD-10-CM

## 2016-03-03 DIAGNOSIS — E1169 Type 2 diabetes mellitus with other specified complication: Secondary | ICD-10-CM

## 2016-03-03 DIAGNOSIS — N182 Chronic kidney disease, stage 2 (mild): Secondary | ICD-10-CM

## 2016-03-03 NOTE — Progress Notes (Signed)
Location:  [facility] clinic Provider:  Janicia Monterrosa L. Mariea Clonts, D.O., C.M.D.  Code Status: DNR Goals of Care:  Advanced Directives 02/08/2016  Does patient have an advance directive? Yes  Type of Advance Directive -  Does patient want to make changes to advanced directive? -  Copy of advanced directive(s) in chart? -  Would patient like information on creating an advanced directive? -     Chief Complaint  Patient presents with  . Medical Management of Chronic Issues    3 mth follow-up    HPI: Patient is a 82 y.o. male seen today for medical management of chronic diseases.    He has lost 2 more lbs.  He has seen NP Chester Holstein at GI and had a cscope on 02/08/16:  Impression:  - One 3 mm polyp in the ascending colon, removed with a cold snare. Resected and retrieved. - Diverticulosis in the entire examined colon. - Internal hemorrhoids.- The examination was otherwise normal on direct and retroflexion views.  No further cscopes.    Pt is awaiting phone call from [facility] about the housing situation.  She called him twice to confirm his AWV and he has not called her back.  We confirmed his number is correct.  He is to be moving to an apt next month.  Still eating two meals per day.   He wants to find a house due to too much furniture.  Does go to laundromat to wash his clothes each Saturday.  Continues to have urine odor today.    Past Medical History:  Diagnosis Date  . CAD (coronary artery disease)    PTCA of OM in 1998, BMS OM in 2000, Cath 9/07 LM ok LAD ok. LCX 95% in OM prior to previous stent RCA. nondominant normal EF normal. Cypher DES to OM 2007 (PLACED PROXIAMAL TO PREVIOUS STENT)  . Chronic kidney disease, stage III (moderate)   . Chronic kidney disease, unspecified   . Coronary atherosclerosis of unspecified type of vessel, native or graft   . Depression   . DM type 2 (diabetes mellitus, type 2) ([place])   . DM type 2 causing CKD stage 2 ([place]) 08/01/2011  . Gout, unspecified   . HLD  (hyperlipidemia)   . HTN (hypertension)   . Loss of weight   . Memory loss   . MGUS (monoclonal gammopathy of unknown significance) 08/01/2011   IgG kappa dx 2002 8% plasma cells in bone marrow; no lesions on bone X-rays;  [name] Monoclonal paraproteinemia   . Osteoarthrosis, unspecified whether generalized or localized, unspecified site   . Other atopic dermatitis and related conditions   . Peripheral vascular disease, unspecified   . Personality change due to conditions classified elsewhere   . PVD (peripheral vascular disease) ([place])   . Renal insufficiency   . S/P coronary artery stent placement 08/01/2011  . Spasm of muscle   . Thyroid disease   . Unspecified disorder of kidney and ureter   . Unspecified hearing loss   . Urinary tract infection, site not specified     Past Surgical History:  Procedure Laterality Date  . CAROTID STENT    . CORONARY ANGIOPLASTY    . TOTAL KNEE ARTHROPLASTY Left     Allergies  Allergen Reactions  . Ace Inhibitors Swelling    angioedema  . Losartan Potassium Swelling    Angioedema   . Crestor [Rosuvastatin] Swelling      Medication List       Accurate as of  03/03/16  1:36 PM. Always use your most recent med list.          allopurinol 100 MG tablet Commonly known as:  ZYLOPRIM Take 1 tablet (100 mg total) by mouth daily.   amLODipine 2.5 MG tablet Commonly known as:  NORVASC Take 2 tablets (5 mg total) by mouth daily.   aspirin 81 MG tablet Take 1 tablet (81 mg total) by mouth daily.   atorvastatin 80 MG tablet Commonly known as:  LIPITOR Take 1 tablet (80 mg total) by mouth daily. For High Cholesterol   citalopram 20 MG tablet Commonly known as:  CELEXA Take 1 tablet (20 mg total) by mouth daily.   metFORMIN 500 MG tablet Commonly known as:  GLUCOPHAGE Take one tablet by mouth once daily with breakfast to control blood sugar   metoprolol 50 MG tablet Commonly known as:  LOPRESSOR Take one tablet by mouth twice daily  for blood pressure   nitroGLYCERIN 0.4 MG SL tablet Commonly known as:  NITROSTAT Place 0.4 mg under the tongue every 5 (five) minutes as needed for chest pain. Up to 3 doses   ONE TOUCH ULTRA TEST test strip Generic drug:  glucose blood Use to test blood sugar once daily   potassium chloride SA 20 MEQ tablet Commonly known as:  K-DUR,KLOR-CON TAKE 1 TABLET DAILY FOR 3 DAYS AS DIRECTED   ULTRA-THIN II MINI PEN NEEDLE 31G X 5 MM Misc Generic drug:  Insulin Pen Needle       Review of Systems:  Review of Systems  Constitutional: Positive for weight loss. Negative for chills, fever and malaise/fatigue.  HENT: Positive for hearing loss.   Eyes: Negative for blurred vision.       Glasses  Respiratory: Negative for cough and shortness of breath.   Cardiovascular: Negative for chest pain, palpitations and leg swelling.  Gastrointestinal: Negative for abdominal pain, blood in stool, constipation, diarrhea, heartburn, melena, nausea and vomiting.  Genitourinary: Negative for dysuria.  Musculoskeletal: Negative for falls.  Skin: Negative for itching and rash.  Neurological: Negative for dizziness, loss of consciousness and weakness.  Psychiatric/Behavioral: Positive for depression and memory loss. The patient is not nervous/anxious and does not have insomnia.     Health Maintenance  Topic Date Due  . TETANUS/TDAP  12/16/1949  . ZOSTAVAX  12/15/1992  . OPHTHALMOLOGY EXAM  04/14/2014  . URINE MICROALBUMIN  03/07/2015  . HEMOGLOBIN A1C  07/14/2016  . FOOT EXAM  11/29/2016  . INFLUENZA VACCINE  Completed  . PNA vac Low Risk Adult  Completed    Physical Exam: Vitals:   03/03/16 1314  BP: 140/70  Pulse: 66  Temp: 98 F (36.7 C)  TempSrc: Oral  SpO2: 98%  Weight: 165 lb (74.8 kg)   Body mass index is 28.77 kg/m. Physical Exam  Constitutional: He is oriented to person, place, and time. He appears well-developed and well-nourished. No distress.  Old clothes, poor hygiene  with smell of urine  Cardiovascular: Normal rate, regular rhythm and intact distal pulses.   Murmur heard. Pulmonary/Chest: Effort normal and breath sounds normal. No respiratory distress.  Abdominal: Bowel sounds are normal.  Musculoskeletal: Normal range of motion.  Walking better after injections  Neurological: He is alert and oriented to person, place, and time.  Skin: Skin is warm and dry.  Psychiatric: He has a normal mood and affect.    Labs reviewed: Basic Metabolic Panel:  Recent Labs  04/30/15 1017 01/14/16 1226  NA 143 141  K  4.3 4.7  CL 103 107  CO2 22 26  GLUCOSE 130* 86  BUN 21 21  CREATININE 1.63* 1.27*  CALCIUM 9.4 10.1   Liver Function Tests:  Recent Labs  04/30/15 1017 07/30/15 0919 01/14/16 1226  AST 24  --  32  ALT 23  --  28  ALKPHOS 73  --  72  BILITOT 0.3  --  0.3  PROT 7.4 7.5 7.2  ALBUMIN 4.1  --  4.0   No results for input(s): LIPASE, AMYLASE in the last 8760 hours. No results for input(s): AMMONIA in the last 8760 hours. CBC:  Recent Labs  04/30/15 1017 07/30/15 0919 01/14/16 1226  WBC 6.9 7.3 6.6  NEUTROABS 4.4 4.8 4,554  HGB  --   --  12.2*  HCT 39.8 38.8 36.8*  MCV 91 93 91.3  PLT 442* 382* 413*   Lipid Panel:  Recent Labs  04/30/15 1017  CHOL 244*  HDL 55  LDLCALC 164*  TRIG 123  CHOLHDL 4.4   Lab Results  Component Value Date   HGBA1C 5.4 01/14/2016    Assessment/Plan 1. Loss of weight -down another two lbs, still living in hotel and eating only 2x per day -cscope was negative for malignancy   2. Type 2 diabetes mellitus with stage 2 chronic kidney disease, without long-term current use of insulin (HCC) -has been well controlled and on metformin--may be able to stop this--noted after he was gone from office  3. Atherosclerosis of native coronary artery of native heart without angina pectoris -stable, no recent symptoms, cont current regimen  4. Essential hypertension, benign -bp slightly up today,  but he's stressed about his living situation   5. Mixed hyperlipidemia due to type 2 diabetes mellitus (HCC) -cont lipitor and monitor  6. Diverticulosis of large intestine without hemorrhage -noted on cscope, no symptoms--reviewed what to watch out for with diverticulitis if it should develop  7. Internal hemorrhoids without complication -discussed presence on cscope, monitor, no symptoms  Labs/tests ordered:  No orders of the defined types were placed in this encounter.   Next appt:  05/05/2016   Kalmen Lollar L. Antoinne Spadaccini, D.O. [facility] [address], [place] [address] Cell Phone (Mon-Fri 8am-5pm):  [phone] On Call:  [phone] & follow prompts after 5pm & weekends Office Phone:  [phone] Office Fax:  [phone]

## 2016-04-18 ENCOUNTER — Ambulatory Visit (HOSPITAL_COMMUNITY)
Admission: RE | Admit: 2016-04-18 | Discharge: 2016-04-18 | Disposition: A | Discharge status: Home / Self Care | Payer: Medicare HMO | Source: Ambulatory Visit | Attending: Internal Medicine | Admitting: Internal Medicine

## 2016-04-18 ENCOUNTER — Ambulatory Visit (HOSPITAL_BASED_OUTPATIENT_CLINIC_OR_DEPARTMENT_OTHER)
Admission: RE | Admit: 2016-04-18 | Discharge: 2016-04-18 | Disposition: A | Discharge status: Home / Self Care | Payer: Medicare HMO | Source: Ambulatory Visit | Attending: Internal Medicine | Admitting: Internal Medicine

## 2016-04-18 ENCOUNTER — Encounter (HOSPITAL_COMMUNITY): Payer: Self-pay | Admitting: Internal Medicine

## 2016-04-18 VITALS — BP 132/80 | HR 85 | Wt 170.0 lb

## 2016-04-18 DIAGNOSIS — M109 Gout, unspecified: Secondary | ICD-10-CM | POA: Insufficient documentation

## 2016-04-18 DIAGNOSIS — I10 Essential (primary) hypertension: Secondary | ICD-10-CM | POA: Diagnosis not present

## 2016-04-18 DIAGNOSIS — I129 Hypertensive chronic kidney disease with stage 1 through stage 4 chronic kidney disease, or unspecified chronic kidney disease: Secondary | ICD-10-CM | POA: Diagnosis not present

## 2016-04-18 DIAGNOSIS — F329 Major depressive disorder, single episode, unspecified: Secondary | ICD-10-CM | POA: Insufficient documentation

## 2016-04-18 DIAGNOSIS — E079 Disorder of thyroid, unspecified: Secondary | ICD-10-CM | POA: Insufficient documentation

## 2016-04-18 DIAGNOSIS — I2510 Atherosclerotic heart disease of native coronary artery without angina pectoris: Secondary | ICD-10-CM | POA: Diagnosis not present

## 2016-04-18 DIAGNOSIS — E785 Hyperlipidemia, unspecified: Secondary | ICD-10-CM | POA: Diagnosis not present

## 2016-04-18 DIAGNOSIS — R011 Cardiac murmur, unspecified: Secondary | ICD-10-CM

## 2016-04-18 DIAGNOSIS — Z955 Presence of coronary angioplasty implant and graft: Secondary | ICD-10-CM | POA: Diagnosis not present

## 2016-04-18 DIAGNOSIS — M1990 Unspecified osteoarthritis, unspecified site: Secondary | ICD-10-CM | POA: Diagnosis not present

## 2016-04-18 DIAGNOSIS — N183 Chronic kidney disease, stage 3 (moderate): Secondary | ICD-10-CM | POA: Diagnosis not present

## 2016-04-18 DIAGNOSIS — I080 Rheumatic disorders of both mitral and aortic valves: Secondary | ICD-10-CM | POA: Diagnosis not present

## 2016-04-18 DIAGNOSIS — Z7984 Long term (current) use of oral hypoglycemic drugs: Secondary | ICD-10-CM | POA: Insufficient documentation

## 2016-04-18 DIAGNOSIS — H9190 Unspecified hearing loss, unspecified ear: Secondary | ICD-10-CM | POA: Diagnosis not present

## 2016-04-18 DIAGNOSIS — Z7982 Long term (current) use of aspirin: Secondary | ICD-10-CM | POA: Diagnosis not present

## 2016-04-18 DIAGNOSIS — E1122 Type 2 diabetes mellitus with diabetic chronic kidney disease: Secondary | ICD-10-CM | POA: Insufficient documentation

## 2016-04-18 DIAGNOSIS — E1151 Type 2 diabetes mellitus with diabetic peripheral angiopathy without gangrene: Secondary | ICD-10-CM | POA: Insufficient documentation

## 2016-04-18 HISTORY — PX: TRANSTHORACIC ECHOCARDIOGRAM: SHX275

## 2016-04-18 NOTE — Patient Instructions (Signed)
It was great to see you today.   Your physician recommends that you schedule a follow-up appointment in: 12 MONTHS

## 2016-04-18 NOTE — Progress Notes (Signed)
Echocardiogram 2D Echocardiogram has been performed.  Aggie Cosier 04/18/2016, 9:52 AM

## 2016-04-18 NOTE — Progress Notes (Signed)
CARDIOLOGY CLINIC NOTE  Patient ID: Kevin Schwartz, male   DOB: [birth year], 83 y.o.   MRN: [id number] PCP: Dr. Hollace Kinnier  at [facility]  HPI: [name] is a delightful 83 y/o male with multiple medical problems including DM2, HTN, HL, mild renal insufficiency, RAS s/p stenting of L renal artery in 2008, CAD s/p multiple PCIs on OM-1 (last cath 2007).   Last Myoview 2010. Ok  Ab u/s in 2/12: showed normal renals and aorta.  He is here for follow up. We have not seen him in over 2 years. Had lost 20-30 pounds and had full work-up with Dr. Mariea Clonts. no evidence of malignancy or other issue. He has now put back about 10 pounds. Denies SOB/CP/PND/Orthopnea. [facility] [employer] 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 Says he sold his house and now living in [place]. Looking for another place. Compliant with medications..  Echo today reviewed personally EF 55-60% + DD. Aortic valve mildly thickened mild AI. No significant AS. MV mildly thick.    ROS: All systems negative except as listed in HPI, PMH and Problem List.  Past Medical History:  Diagnosis Date  . CAD (coronary artery disease)    PTCA of OM in 1998, BMS OM in 2000, Cath 9/07 LM ok LAD ok. LCX 95% in OM prior to previous stent RCA. nondominant normal EF normal. Cypher DES to OM 2007 (PLACED PROXIAMAL TO PREVIOUS STENT)  . Chronic kidney disease, stage III (moderate)   . Chronic kidney disease, unspecified   . Coronary atherosclerosis of unspecified type of vessel, native or graft   . Depression   . DM type 2 (diabetes mellitus, type 2) ([place])   . DM type 2 causing CKD stage 2 ([place]) 08/01/2011  . Gout, unspecified   . HLD (hyperlipidemia)   . HTN (hypertension)   . Loss of weight   . Memory loss   . MGUS (monoclonal gammopathy of unknown significance) 08/01/2011   IgG kappa dx 2002 8% plasma cells in bone marrow; no lesions on bone X-rays;    [name] Monoclonal paraproteinemia   . Osteoarthrosis, unspecified whether generalized or localized, unspecified site   . Other atopic dermatitis and related conditions   . Peripheral vascular disease, unspecified   . Personality change due to conditions classified elsewhere   . PVD (peripheral vascular disease) ([place])   . Renal insufficiency   . S/P coronary artery stent placement 08/01/2011  . Spasm of muscle   . Thyroid disease   . Unspecified disorder of kidney and ureter   . Unspecified hearing loss   . Urinary tract infection, site not specified     Current Outpatient Prescriptions  Medication Sig Dispense Refill  . allopurinol (ZYLOPRIM) 100 MG tablet Take 1 tablet (100 mg total) by mouth daily. 90 tablet 3  . amLODipine (NORVASC) 2.5 MG tablet Take 2 tablets (5 mg total) by mouth daily. 180 tablet 3  . aspirin 81 MG tablet Take 1 tablet (81 mg total) by mouth daily. 90 tablet 1  . atorvastatin (LIPITOR) 80 MG tablet Take 1 tablet (80 mg total) by mouth daily. For High Cholesterol 90 tablet 3  . citalopram (CELEXA) 20 MG tablet Take 1 tablet (20 mg total) by mouth daily. 90 tablet 3  . metFORMIN (GLUCOPHAGE) 500 MG tablet Take one tablet by mouth once daily with breakfast to control blood sugar 90 tablet 3  .  metoprolol (LOPRESSOR) 50 MG tablet Take one tablet by mouth twice daily for blood pressure 180 tablet 3  . nitroGLYCERIN (NITROSTAT) 0.4 MG SL tablet Place 0.4 mg under the tongue every 5 (five) minutes as needed for chest pain. Up to 3 doses    . ONE TOUCH ULTRA TEST test strip Use to test blood sugar once daily    . ULTRA-THIN II MINI PEN NEEDLE 31G X 5 MM MISC      Current Facility-Administered Medications  Medication Dose Route Frequency Provider Last Rate Last Dose  . 0.9 %  sodium chloride infusion  500 mL Intravenous Continuous Irene Shipper, MD         PHYSICAL EXAM: Vitals:   04/18/16 1020  BP: 132/80  Pulse: 85   General:  Elderly: well appearing. no resp  difficulty HEENT: normal Neck: supple. no JVD. Carotids 2+ bilat; no bruits. No lymphadenopathy or thryomegaly appreciated. Cor: PMI nondisplaced. Regular rate & rhythm. No rubs, AS 1/6 murmur.  Lungs: clear Abdomen: soft, nontender, nondistended. No hepatosplenomegaly. No bruits or masses. Good bowel sounds. Extremities: no cyanosis, clubbing, rash, edema Neuro: alert & orientedx3, cranial nerves grossly intact. moves all 4 extremities w/o difficulty. affect pleasant  ECG: SR 68 1avb PR 24ms. LVH. Anterior qs. No acute ST-T abnormalities.  (no significant change since 05/02/2010)  ASSESSMENT & PLAN:  1. HTN- well controlled Continue current regimen  2. CAD- stable. No evidence of ischemia. On aspirin, statin and beta blocker  3. Hyperlipidemia - followed by Dr. Mariea Clonts. Continue atorva. Goal LDL < 70.   4. Aortic murmur - Mild AI on echo (reviewed images personally today in clinic with him).   Will see back in 1 year for yearly f/u.   Bensimhon, Daniel,MD 10:48 AM

## 2016-05-05 ENCOUNTER — Ambulatory Visit: Payer: Commercial Managed Care - HMO

## 2016-05-09 ENCOUNTER — Ambulatory Visit (INDEPENDENT_AMBULATORY_CARE_PROVIDER_SITE_OTHER): Payer: Medicare HMO

## 2016-05-09 VITALS — BP 186/82 | HR 82 | Temp 98.0°F | Ht 64.0 in | Wt 168.2 lb

## 2016-05-09 DIAGNOSIS — Z0000 Encounter for general adult medical examination without abnormal findings: Secondary | ICD-10-CM | POA: Diagnosis not present

## 2016-05-09 NOTE — Progress Notes (Signed)
   I reviewed health advisor's note, was available for consultation and agree with the assessment and plan as written.  I will make sure he does the urine microalbumin at his next visit thought he cannot take ace/arb anyway to manage it.  Doubt eye exam will occur if he has a copay.  Pt can't afford things which is why he lost his home and lives in a hotel. I had referred him to [facility] to begin with due to this concern.  He also has been having urinary incontinence evidently, but does not seem aware, will not admit to it.    Lynnox Girten L. Alaric Gladwin, D.O. [facility] [address], [place] [address] Cell Phone (Mon-Fri 8am-5pm):  [phone] On Call:  [phone] & follow prompts after 5pm & weekends Office Phone:  [phone] Office Fax:  [phone]   Quick Notes   Health Maintenance:  Pt due for Urine microalbumin and eye exam (states he will make an appt.)    Abnormal Screen: None; MMSE-28/30 Passed Clock test    Patient Concerns:  None    Nurse Concerns:  Pt has been living in a motel ([facility] off [address]) for quite some time now. He has told [facility] that he is suppose to be getting an apartment in [place] but he has been saying that for awhile. He told me today that he is eating at least twice a day (gave pt a case of Ensure today) but it's not a lot. He smelled of urine today. Super pleasant. He has agreed to let us ([facility]3) help him with whatever we can find for help. Wanted to make you aware, in case you did not know.

## 2016-05-09 NOTE — Progress Notes (Signed)
Subjective:   Kevin Schwartz is a 83 y.o. male who presents for an Initial Medicare Annual Wellness Visit.  Review of Systems  Cardiac Risk Factors include: advanced age (>42men, >69 women);hypertension;male gender;family history of premature cardiovascular disease;sedentary lifestyle    Objective:    Today's Vitals   05/09/16 1531  BP: (!) 186/82  Pulse: 82  Temp: 98 F (36.7 C)  TempSrc: Oral  SpO2: 99%  Weight: 168 lb 3.2 oz (76.3 kg)  Height: 5\' 4"  (1.626 m)   Body mass index is 28.87 kg/m.  Current Medications (verified) Outpatient Encounter Prescriptions as of 05/09/2016  Medication Sig  . allopurinol (ZYLOPRIM) 100 MG tablet Take 1 tablet (100 mg total) by mouth daily.  [name] amLODipine (NORVASC) 2.5 MG tablet Take 2 tablets (5 mg total) by mouth daily.  [name] aspirin 81 MG tablet Take 1 tablet (81 mg total) by mouth daily.  [name] atorvastatin (LIPITOR) 80 MG tablet Take 1 tablet (80 mg total) by mouth daily. For High Cholesterol  . citalopram (CELEXA) 20 MG tablet Take 1 tablet (20 mg total) by mouth daily.  . metFORMIN (GLUCOPHAGE) 500 MG tablet Take one tablet by mouth once daily with breakfast to control blood sugar  . metoprolol (LOPRESSOR) 50 MG tablet Take one tablet by mouth twice daily for blood pressure  . nitroGLYCERIN (NITROSTAT) 0.4 MG SL tablet Place 0.4 mg under the tongue every 5 (five) minutes as needed for chest pain. Up to 3 doses  . ONE TOUCH ULTRA TEST test strip Use to test blood sugar once daily  . ULTRA-THIN II MINI PEN NEEDLE 31G X 5 MM MISC    Facility-Administered Encounter Medications as of 05/09/2016  Medication  . 0.9 %  sodium chloride infusion    Allergies (verified) Ace inhibitors; Losartan potassium; and Crestor [rosuvastatin]   History: Past Medical History:  Diagnosis Date  . CAD (coronary artery disease)    PTCA of OM in 1998, BMS OM in 2000, Cath 9/07 LM ok LAD ok. LCX 95% in OM prior to previous stent RCA. nondominant normal EF  normal. Cypher DES to OM 2007 (PLACED PROXIAMAL TO PREVIOUS STENT)  . Chronic kidney disease, stage III (moderate)   . Chronic kidney disease, unspecified   . Coronary atherosclerosis of unspecified type of vessel, native or graft   . Depression   . DM type 2 (diabetes mellitus, type 2) ([place])   . DM type 2 causing CKD stage 2 ([place]) 08/01/2011  . Gout, unspecified   . HLD (hyperlipidemia)   . HTN (hypertension)   . Loss of weight   . Memory loss   . MGUS (monoclonal gammopathy of unknown significance) 08/01/2011   IgG kappa dx 2002 8% plasma cells in bone marrow; no lesions on bone X-rays;  [name] Monoclonal paraproteinemia   . Osteoarthrosis, unspecified whether generalized or localized, unspecified site   . Other atopic dermatitis and related conditions   . Peripheral vascular disease, unspecified   . Personality change due to conditions classified elsewhere   . PVD (peripheral vascular disease) ([place])   . Renal insufficiency   . S/P coronary artery stent placement 08/01/2011  . Spasm of muscle   . Thyroid disease   . Unspecified disorder of kidney and ureter   . Unspecified hearing loss   . Urinary tract infection, site not specified    Past Surgical History:  Procedure Laterality Date  . CAROTID STENT    . CORONARY ANGIOPLASTY    . TOTAL KNEE  ARTHROPLASTY Left    Family History  Problem Relation Age of Onset  . Heart disease Mother   . Heart disease Sister   . Heart disease Sister   . Cancer Sister     type unknown  . Hypertension Sister   . Diabetes Brother   . Diabetes Brother   . Diabetes    . Coronary artery disease    . Cancer     Social History   Occupational History  . retired     Social History Main Topics  . Smoking status: Former Smoker    Types: Cigarettes    Quit date: 02/01/2016  . Smokeless tobacco: Never Used  . Alcohol use No  . Drug use: No  . Sexual activity: Not on file   Tobacco Counseling Counseling given: No   Activities of Daily  Living In your present state of health, do you have any difficulty performing the following activities: 05/09/2016  Hearing? N  Vision? N  Difficulty concentrating or making decisions? N  Walking or climbing stairs? Y  Dressing or bathing? N  Doing errands, shopping? N  Preparing Food and eating ? N  Using the Toilet? N  In the past six months, have you accidently leaked urine? Y  Do you have problems with loss of bowel control? N  Managing your Medications? N  Managing your Finances? N  Housekeeping or managing your Housekeeping? Y  Some recent data might be hidden    Immunizations and Health Maintenance Immunization History  Administered Date(s) Administered  . Influenza,inj,Quad PF,36+ Mos 12/27/2012, 03/06/2014, 04/02/2015, 11/30/2015  . Pneumococcal Conjugate-13 09/22/2014  . Pneumococcal Polysaccharide-23 12/27/2012   Health Maintenance Due  Topic Date Due  . TETANUS/TDAP  12/16/1949  . ZOSTAVAX  12/15/1992  . OPHTHALMOLOGY EXAM  04/14/2014  . URINE MICROALBUMIN  03/07/2015    Patient Care Team: Gayland Curry, DO as PCP - General (Geriatric Medicine) Jolaine Artist, MD as Consulting Physician (Cardiology)  Indicate any recent Medical Services you may have received from other than [facility] providers in the past year (date may be approximate).    Assessment:   This is a routine wellness examination for Kevin Schwartz.  Hearing/Vision screen Hearing Screening Comments: Pt has not had a hearing screen in many years. Denies any problems hearing.  Vision Screening Comments: Pt is due for eye exam; states he will make an appt this month.   Dietary issues and exercise activities discussed: Current Exercise Habits: The patient does not participate in regular exercise at present;Home exercise routine, Type of exercise: walking (Pt states he does water aerobics at the [facility]), Intensity: Mild  Goals    . <enter goal here>          Starting 05/09/16, I will maintain currant  lifestyle, while working on making it better.       Depression Screen PHQ 2/9 Scores 05/09/2016 03/03/2016 08/30/2015 07/26/2015  PHQ - 2 Score 1 0 0 0    Fall Risk Fall Risk  05/09/2016 03/03/2016 08/30/2015 07/26/2015 04/26/2015  Falls in the past year? No No No No No    Cognitive Function: MMSE - Mini Mental State Exam 05/09/2016 04/26/2015  Orientation to time 5 5  Orientation to Place 5 5  Registration 3 3  Attention/ Calculation 3 4  Recall 3 3  Language- name 2 objects 2 2  Language- repeat 1 1  Language- follow 3 step command 3 3  Language- read & follow direction 1 1  Write a sentence  1 1  Copy design 1 1  Total score 28 29        Screening Tests Health Maintenance  Topic Date Due  . TETANUS/TDAP  12/16/1949  . ZOSTAVAX  12/15/1992  . OPHTHALMOLOGY EXAM  04/14/2014  . URINE MICROALBUMIN  03/07/2015  . HEMOGLOBIN A1C  07/14/2016  . FOOT EXAM  11/29/2016  . INFLUENZA VACCINE  Completed  . PNA vac Low Risk Adult  Completed        Plan:    I have personally reviewed and addressed the Medicare Annual Wellness questionnaire and have noted the following in the patient's chart:  A. Medical and social history B. Use of alcohol, tobacco or illicit drugs  C. Current medications and supplements D. Functional ability and status E.  Nutritional status F.  Physical activity G. Advance directives H. List of other physicians I.  Hospitalizations, surgeries, and ER visits in previous 12 months J.  [place] to include hearing, vision, cognitive, depression L. Referrals and appointments - none  In addition, I have reviewed and discussed with patient certain preventive protocols, quality metrics, and best practice recommendations. A written personalized care plan for preventive services as well as general preventive health recommendations were provided to patient.  See attached scanned questionnaire for additional information.   Signed,   Allyn Kenner,  LPN Health Advisor

## 2016-05-09 NOTE — Patient Instructions (Addendum)
Kevin Schwartz , Thank you for taking time to come for your Medicare Wellness Visit. I appreciate your ongoing commitment to your health goals. Please review the following plan we discussed and let me know if I can assist you in the future.   These are the goals we discussed: Goals    . <enter goal here>          Starting 05/09/16, I will maintain currant lifestyle, while working on making it better.        This is a list of the screening recommended for you and due dates:  Health Maintenance  Topic Date Due  . Tetanus Vaccine  12/16/1949  . Shingles Vaccine  12/15/1992  . Eye exam for diabetics  04/14/2014  . Urine Protein Check  03/07/2015  . Hemoglobin A1C  07/14/2016  . Complete foot exam   11/29/2016  . Flu Shot  Completed  . Pneumonia vaccines  Completed  Preventive Care for Adults  A healthy lifestyle and preventive care can promote health and wellness. Preventive health guidelines for adults include the following key practices.  . A routine yearly physical is a good way to check with your health care provider about your health and preventive screening. It is a chance to share any concerns and updates on your health and to receive a thorough exam.  . Visit your dentist for a routine exam and preventive care every 6 months. Brush your teeth twice a day and floss once a day. Good oral hygiene prevents tooth decay and gum disease.  . The frequency of eye exams is based on your age, health, family medical history, use  of contact lenses, and other factors. Follow your health care provider's ecommendations for frequency of eye exams.  . Eat a healthy diet. Foods like vegetables, fruits, whole grains, low-fat dairy products, and lean protein foods contain the nutrients you need without too many calories. Decrease your intake of foods high in solid fats, added sugars, and salt. Eat the right amount of calories for you. Get information about a proper diet from your health care provider,  if necessary.  . Regular physical exercise is one of the most important things you can do for your health. Most adults should get at least 150 minutes of moderate-intensity exercise (any activity that increases your heart rate and causes you to sweat) each week. In addition, most adults need muscle-strengthening exercises on 2 or more days a week.  Silver Sneakers may be a benefit available to you. To determine eligibility, you may visit the website: www.silversneakers.com or contact program at (323)535-9380 Mon-Fri between 8AM-8PM.   . Maintain a healthy weight. The body mass index (BMI) is a screening tool to identify possible weight problems. It provides an estimate of body fat based on height and weight. Your health care provider can find your BMI and can help you achieve or maintain a healthy weight.   For adults 20 years and older: ? A BMI below 18.5 is considered underweight. ? A BMI of 18.5 to 24.9 is normal. ? A BMI of 25 to 29.9 is considered overweight. ? A BMI of 30 and above is considered obese.   . Maintain normal blood lipids and cholesterol levels by exercising and minimizing your intake of saturated fat. Eat a balanced diet with plenty of fruit and vegetables. Blood tests for lipids and cholesterol should begin at age 43 and be repeated every 5 years. If your lipid or cholesterol levels are high, you are over  50, or you are at high risk for heart disease, you may need your cholesterol levels checked more frequently. Ongoing high lipid and cholesterol levels should be treated with medicines if diet and exercise are not working.  . If you smoke, find out from your health care provider how to quit. If you do not use tobacco, please do not start.  . If you choose to drink alcohol, please do not consume more than 2 drinks per day. One drink is considered to be 12 ounces (355 mL) of beer, 5 ounces (148 mL) of wine, or 1.5 ounces (44 mL) of liquor.  . If you are 61-41 years old, ask  your health care provider if you should take aspirin to prevent strokes.  . Use sunscreen. Apply sunscreen liberally and repeatedly throughout the day. You should seek shade when your shadow is shorter than you. Protect yourself by wearing long sleeves, pants, a wide-brimmed hat, and sunglasses year round, whenever you are outdoors.  . Once a month, do a whole body skin exam, using a mirror to look at the skin on your back. Tell your health care provider of new moles, moles that have irregular borders, moles that are larger than a pencil eraser, or moles that have changed in shape or color.

## 2016-05-12 ENCOUNTER — Other Ambulatory Visit: Payer: Self-pay | Admitting: Internal Medicine

## 2016-05-12 DIAGNOSIS — E1122 Type 2 diabetes mellitus with diabetic chronic kidney disease: Secondary | ICD-10-CM

## 2016-05-13 ENCOUNTER — Encounter: Payer: Self-pay | Admitting: Internal Medicine

## 2016-05-13 ENCOUNTER — Other Ambulatory Visit: Payer: Self-pay | Admitting: Licensed Clinical Social Worker

## 2016-05-13 ENCOUNTER — Other Ambulatory Visit: Payer: Self-pay

## 2016-05-13 NOTE — Patient Outreach (Addendum)
[place] [facility]) Care Management  05/13/2016  Kevin Schwartz 05-06-1930 [id number]   Assessment- CSW received new referral on patient in order to assist with housing resources. CSW completed initial outreach and patient answered. Patient provided HIPPA verifications. CSW introduced self, reason for call and of [facility] social work services. Patient reports that he receive over $2,000 per month from both social security and retirement. Patient shares that he has stable transportation by having a car. He denies having any financial concerns reporting that he is able to afford groceries and just went grocery shopping today. He reports that his main issue that he will be leaving the [facility] this Thursday on 05/15/16 and will need to find a new residence. Patient reports that he was only residing at [facility] to save up money to relocate. He states that he has visited a couple apartment complexes near the [place] area that is affordable and in a safe and quiet area. He reports that he plans of relocating to one of the apartment complexes that he found. CSW questioned if he had any further social work needs at this time and he denied. CSW spent time educating patient on all [place] resources which include: [employer], [place] [place] and [employer] [place]. Patient was educated on how to apply for [employer] and encouraged to go to the [employer] if needed. Patient denies needing to do so but is agreeable to CSW mailing all resources to his PO [address]OX mailing address. Patient denies needing a list of homeless shelters. Patient is extremely pleasant and appreciative of all resource information that was provided during phone session today and is agreeable to CSW closing case at this time.    Plan-CSW  will close case. CSW will update [facility] [name]. CSW will mail all housing resources to patient's PO [address]OX mailing address.  Eula Fried, BSW, MSW, [email or web address] Phone: [phone] Fax: [phone]

## 2016-05-13 NOTE — Patient Outreach (Addendum)
[place] [facility]) Care Management  05/13/2016  Kevin Schwartz 1930/07/06 [id number]     Telephone Screen  Referral Date: 05/12/16 Referral Source: MD office(Dr. Mariea Clonts) Referral Reason: "DM, CKD,need for permanent housing"    Outreach attempt # 1  to patient. Spoke with patient. Screening completed.   Social: Patietn resides alone. He voices that he is indpednent with ADLs/IADLs. He drives himself to medical appts. Denies any falls. He satets that he has a cane that he uses prn when his arthritis flares up. Other DME include cbg meter and BP monitor. Patietn curent[facility] for the past four months. He states he sold his home and is trying to find an affordable apartment. He desires to move to [place] apartments [place]. Paitnet statets that when he went to inuire about aprtments at that time he was unable to afofrd down payment and security deposit.    Conditions: PMH includes DM, HTN, CKD satge 3, depression, gout., arthritis,HLD, memory loss,PVD, unspecified hearing loss, urinary incotniennce, CAD s/p multiple PCIs. Patient states "all this stuff they say I have, I don't claim it." He reprots that he is monitoring huis blood suagrs about 3x/wk. CBG on yesterday was 96. Patient report that he hcecks bP in the home a few times a week as well but unable to recall value. He voices that he can use some help managing his medial conditions and getting his health under better control.    Medications: Patient denies any issues with affordability and/or managing meds at this time.   Appointments: Patietn saw PCP on 05/09/16 and saw cardiologist(Dr. Bensimhon) on 04/18/16.   Advance Directives: None. Declined info.   Consent: [facility] services reviewed and discussed. Patient gave verbal consent for [facility] services.   Plan: RN CM will notify [facility] administrative assistant of case status. RN CM will send [facility] community referral for further in  home eval and assessment of care needs. RN CM will send [facility] SW referral for possible assistance with permanent housing.    Enzo Montgomery, RN,BSN,CCM [place] Management Telephonic Care Management Coordinator Direct Phone: [phone] Toll Free: [phone] Fax: [phone]

## 2016-05-14 ENCOUNTER — Other Ambulatory Visit: Payer: Self-pay

## 2016-05-14 NOTE — Patient Outreach (Signed)
[place] [facility]) Care Management  05/14/2016   Kevin Schwartz Nov 04, 1930 [id number]  Subjective:  I did not move into my place, I had problems with finance  Objective:  Telephonic encounter  Current Medications:  Current Outpatient Prescriptions  Medication Sig Dispense Refill  . allopurinol (ZYLOPRIM) 100 MG tablet Take 1 tablet (100 mg total) by mouth daily. 90 tablet 3  . amLODipine (NORVASC) 2.5 MG tablet Take 2 tablets (5 mg total) by mouth daily. 180 tablet 3  . aspirin 81 MG tablet Take 1 tablet (81 mg total) by mouth daily. 90 tablet 1  . atorvastatin (LIPITOR) 80 MG tablet Take 1 tablet (80 mg total) by mouth daily. For High Cholesterol 90 tablet 3  . citalopram (CELEXA) 20 MG tablet Take 1 tablet (20 mg total) by mouth daily. 90 tablet 3  . metFORMIN (GLUCOPHAGE) 500 MG tablet Take one tablet by mouth once daily with breakfast to control blood sugar 90 tablet 3  . metoprolol (LOPRESSOR) 50 MG tablet Take one tablet by mouth twice daily for blood pressure 180 tablet 3  . nitroGLYCERIN (NITROSTAT) 0.4 MG SL tablet Place 0.4 mg under the tongue every 5 (five) minutes as needed for chest pain. Up to 3 doses    . ONE TOUCH ULTRA TEST test strip Use to test blood sugar once daily    . ULTRA-THIN II MINI PEN NEEDLE 31G X 5 MM MISC      Current Facility-Administered Medications  Medication Dose Route Frequency Provider Last Rate Last Dose  . 0.9 %  sodium chloride infusion  500 mL Intravenous Continuous Irene Shipper, MD        Functional Status:  In your present state of health, do you have any difficulty performing the following activities: 05/14/2016 05/09/2016  Hearing? Y N  Vision? Y N  Difficulty concentrating or making decisions? N N  Walking or climbing stairs? Y Y  Dressing or bathing? N N  Doing errands, shopping? Y N  Preparing Food and eating ? N N  Using the Toilet? N N  In the past six months, have you accidently leaked urine? Y Y  Do you have  problems with loss of bowel control? N N  Managing your Medications? N N  Managing your Finances? Y N  Housekeeping or managing your Housekeeping? N Y  Some recent data might be hidden    Fall/Depression Screening: PHQ 2/9 Scores 05/14/2016 05/13/2016 05/09/2016 03/03/2016 08/30/2015 07/26/2015 04/26/2015  PHQ - 2 Score 0 0 1 0 0 0 0   [facility] CM Care Plan Problem One   Flowsheet Row Most Recent Value  Care Plan Problem One  homeless  Role Documenting the Problem One  Care Management [place] for Problem One  Active  [facility] Long Term Goal (31-90 days)  In the next 31 days, patient will have safe housing  [place] Term Goal Start Date  05/14/16  Interventions for Problem One Long Term Goal  Initial assessment forcommunity care coordination.  Patient informed this [name] he is homeless, wants to move into stable housing  [facility] CM Short Term Goal #1 (0-30 days)  In the next 14 days, patient will  make contact with this [name] with a list of 2 complexes he has gone to to inquire about housing.   [facility] CM Short Term Goal #1 Start Date  05/14/16  Interventions for Short Term Goal #1  Patient advised this RCM he has not moved into housing before now  because of finances     Fall Risk  05/14/2016 05/13/2016 05/09/2016 03/03/2016 08/30/2015  Falls in the past year? _0   Risk for fall due to : Impaired balance/gait;Medication side effect;Impaired vision - - - -   Assessment:  Patient lives in a hotel, is planning to move into an apartment. This [name] met patient while working with his sister who participated with the [facility].  Patient has sufficient income to afford housing, income over $1200 per month.  Plan:  Telephone  Contact with patient within the next 14 days to assess his progress in securing stable housing.

## 2016-05-14 NOTE — Patient Outreach (Signed)
[place] [facility]) Care Management  05/14/2016  Kevin Schwartz Nov 18, 1930 [id number]    Request received from Eula Fried, LCSW to mail patient housing resources.   Kevin Schwartz  [facility] Care Management Assistant

## 2016-05-21 ENCOUNTER — Other Ambulatory Visit: Payer: Self-pay

## 2016-05-21 NOTE — Patient Outreach (Signed)
[facility]) Care Management  05/21/2016   Kevin Schwartz 1930/04/22 [id number]  Subjective:  I have not moved into my apartment yet, I will move in next Wednesday  Objective:   Telephonic encounter  Current Medications:  Current Outpatient Prescriptions  Medication Sig Dispense Refill  . allopurinol (ZYLOPRIM) 100 MG tablet Take 1 tablet (100 mg total) by mouth daily. 90 tablet 3  . amLODipine (NORVASC) 2.5 MG tablet Take 2 tablets (5 mg total) by mouth daily. 180 tablet 3  . aspirin 81 MG tablet Take 1 tablet (81 mg total) by mouth daily. 90 tablet 1  . atorvastatin (LIPITOR) 80 MG tablet Take 1 tablet (80 mg total) by mouth daily. For High Cholesterol 90 tablet 3  . citalopram (CELEXA) 20 MG tablet Take 1 tablet (20 mg total) by mouth daily. 90 tablet 3  . metFORMIN (GLUCOPHAGE) 500 MG tablet Take one tablet by mouth once daily with breakfast to control blood sugar 90 tablet 3  . metoprolol (LOPRESSOR) 50 MG tablet Take one tablet by mouth twice daily for blood pressure 180 tablet 3  . nitroGLYCERIN (NITROSTAT) 0.4 MG SL tablet Place 0.4 mg under the tongue every 5 (five) minutes as needed for chest pain. Up to 3 doses    . ONE TOUCH ULTRA TEST test strip Use to test blood sugar once daily    . ULTRA-THIN II MINI PEN NEEDLE 31G X 5 MM MISC      Current Facility-Administered Medications  Medication Dose Route Frequency Provider Last Rate Last Dose  . 0.9 %  sodium chloride infusion  500 mL Intravenous Continuous Irene Shipper, MD        Functional Status:  In your present state of health, do you have any difficulty performing the following activities: 05/14/2016 05/09/2016  Hearing? Y N  Vision? Y N  Difficulty concentrating or making decisions? N N  Walking or climbing stairs? Y Y  Dressing or bathing? N N  Doing errands, shopping? Y N  Preparing Food and eating ? N N  Using the Toilet? N N  In the past six months, have you accidently leaked urine? Y Y   Do you have problems with loss of bowel control? N N  Managing your Medications? N N  Managing your Finances? Y N  Housekeeping or managing your Housekeeping? N Y  Some recent data might be hidden   Fall Risk  05/14/2016 05/13/2016 05/09/2016 03/03/2016 08/30/2015  Falls in the past year? No No No No No  Risk for fall due to : Impaired balance/gait;Medication side effect;Impaired vision - - - -   Fall/Depression Screening: PHQ 2/9 Scores 05/14/2016 05/13/2016 05/09/2016 03/03/2016 08/30/2015 07/26/2015 04/26/2015  PHQ - 2 Score 0 0 1 0 0 0 0   [facility] CM Care Plan Problem One   Flowsheet Row Most Recent Value  Care Plan Problem One  homeless  Role Documenting the Problem One  Care Management [place] for Problem One  Active  [facility] Long Term Goal (31-90 days)  In the next 31 days, patient will have safe housing  [place] Term Goal Start Date  05/14/16  Interventions for Problem One Long Term Goal  telephone cotact  wit memer to assess his progress in meeting this goal.  Member Kevin Schwartz he is to move into his apartment on Wednesday of next week, May 28, 2016  [facility] CM Short Term Goal #1 (0-30 days)  In the next 14 days, patient will  make contact with this [name] with a list of 2 complexes he has gone to to inquire about housing.   [facility] CM Short Term Goal #1 Start Date  05/14/16  Interventions for Short Term Goal #1  Patient agrees to meet with [facility] on Friday, April 9 at [employer].  Patient currently resides in the [facility] off [address]     Assessment:  Patient reports having not moved into permanent housing as of this contact but plans to move in next week.   Plan:   Contact with patient in the next 21 days.

## 2016-05-23 ENCOUNTER — Ambulatory Visit: Payer: Self-pay

## 2016-05-26 ENCOUNTER — Other Ambulatory Visit: Payer: Self-pay

## 2016-05-26 NOTE — Patient Outreach (Signed)
[facility]) Care Management  05/26/2016  RENZO VINCELETTE [birth year] [id number]   Unsuccessful attempt to make contact with Mr. Platten at [phone] to remind him of today's scheduled visit as herequested. Outgoing message stated the subscriber was not able to receive messages at this time.

## 2016-05-29 ENCOUNTER — Other Ambulatory Visit: Payer: Self-pay

## 2016-05-30 NOTE — Patient Outreach (Signed)
   Telephone call received from patient to inform this [name] he has appointment with his primary care physician on 2/26, is willing to meet with this [facility] after this appointment.  Plan: Meet with patient in the next 21 days for community care coordination

## 2016-06-05 ENCOUNTER — Other Ambulatory Visit: Payer: Self-pay

## 2016-06-06 ENCOUNTER — Encounter (HOSPITAL_COMMUNITY): Payer: Self-pay

## 2016-06-06 ENCOUNTER — Emergency Department (HOSPITAL_COMMUNITY): Payer: Medicare HMO

## 2016-06-06 ENCOUNTER — Emergency Department (HOSPITAL_COMMUNITY)
Admission: EM | Admit: 2016-06-06 | Discharge: 2016-06-06 | Disposition: A | Discharge status: Home / Self Care | Payer: Medicare HMO | Attending: Emergency Medicine | Admitting: Emergency Medicine

## 2016-06-06 DIAGNOSIS — M79641 Pain in right hand: Secondary | ICD-10-CM

## 2016-06-06 DIAGNOSIS — Z7982 Long term (current) use of aspirin: Secondary | ICD-10-CM | POA: Diagnosis not present

## 2016-06-06 DIAGNOSIS — E1122 Type 2 diabetes mellitus with diabetic chronic kidney disease: Secondary | ICD-10-CM | POA: Diagnosis not present

## 2016-06-06 DIAGNOSIS — I129 Hypertensive chronic kidney disease with stage 1 through stage 4 chronic kidney disease, or unspecified chronic kidney disease: Secondary | ICD-10-CM | POA: Insufficient documentation

## 2016-06-06 DIAGNOSIS — L03113 Cellulitis of right upper limb: Secondary | ICD-10-CM | POA: Insufficient documentation

## 2016-06-06 DIAGNOSIS — Z87891 Personal history of nicotine dependence: Secondary | ICD-10-CM | POA: Insufficient documentation

## 2016-06-06 DIAGNOSIS — N183 Chronic kidney disease, stage 3 (moderate): Secondary | ICD-10-CM | POA: Diagnosis not present

## 2016-06-06 DIAGNOSIS — Z7984 Long term (current) use of oral hypoglycemic drugs: Secondary | ICD-10-CM | POA: Diagnosis not present

## 2016-06-06 DIAGNOSIS — I2510 Atherosclerotic heart disease of native coronary artery without angina pectoris: Secondary | ICD-10-CM | POA: Insufficient documentation

## 2016-06-06 LAB — C-REACTIVE PROTEIN: CRP: 5.3 mg/dL — ABNORMAL HIGH (ref <1.0)

## 2016-06-06 LAB — BASIC METABOLIC PANEL
Anion gap: 12 (ref 5–15)
BUN: 17 mg/dL (ref 6–20)
CO2: 20 mmol/L — ABNORMAL LOW (ref 22–32)
Calcium: 9.4 mg/dL (ref 8.9–10.3)
Chloride: 106 mmol/L (ref 101–111)
Creatinine, Ser: 1.45 mg/dL — ABNORMAL HIGH (ref 0.61–1.24)
GFR calc Af Amer: 50 mL/min — ABNORMAL LOW (ref >60)
GFR calc non Af Amer: 43 mL/min — ABNORMAL LOW (ref >60)
Glucose, Bld: 75 mg/dL (ref 65–99)
Potassium: 4 mmol/L (ref 3.5–5.1)
Sodium: 138 mmol/L (ref 135–145)

## 2016-06-06 LAB — CBC WITH DIFFERENTIAL/PLATELET
Basophils Absolute: 0 10*3/uL (ref 0.0–0.1)
Basophils Relative: 0 %
Eosinophils Absolute: 0 10*3/uL (ref 0.0–0.7)
Eosinophils Relative: 0 %
HCT: 38.3 % — ABNORMAL LOW (ref 39.0–52.0)
Hemoglobin: 12.7 g/dL — ABNORMAL LOW (ref 13.0–17.0)
Lymphocytes Relative: 8 %
Lymphs Abs: 0.8 10*3/uL (ref 0.7–4.0)
MCH: 30.4 pg (ref 26.0–34.0)
MCHC: 33.2 g/dL (ref 30.0–36.0)
MCV: 91.6 fL (ref 78.0–100.0)
Monocytes Absolute: 0.6 10*3/uL (ref 0.1–1.0)
Monocytes Relative: 6 %
Neutro Abs: 9 10*3/uL — ABNORMAL HIGH (ref 1.7–7.7)
Neutrophils Relative %: 86 %
Platelets: 322 10*3/uL (ref 150–400)
RBC: 4.18 MIL/uL — ABNORMAL LOW (ref 4.22–5.81)
RDW: 13.8 % (ref 11.5–15.5)
WBC: 10.4 10*3/uL (ref 4.0–10.5)

## 2016-06-06 LAB — SEDIMENTATION RATE: Sed Rate: 85 mm/hr — ABNORMAL HIGH (ref 0–16)

## 2016-06-06 MED ORDER — HYDROCODONE-ACETAMINOPHEN 5-325 MG PO TABS: 1.0000 | ORAL_TABLET | Freq: Four times a day (QID) | ORAL | 0 refills | Status: DC | PRN

## 2016-06-06 MED ORDER — FENTANYL CITRATE (PF) 100 MCG/2ML IJ SOLN
25.0000 ug | INTRAMUSCULAR | Status: DC | PRN
  Administered 2016-06-06: 25 ug via INTRAVENOUS
  Filled 2016-06-06: qty 2

## 2016-06-06 MED ORDER — CEPHALEXIN 500 MG PO CAPS: 500.0000 mg | ORAL_CAPSULE | Freq: Two times a day (BID) | ORAL | 0 refills | Status: DC

## 2016-06-06 MED ORDER — CEFAZOLIN IN D5W 1 GM/50ML IV SOLN
1.0000 g | Freq: Once | INTRAVENOUS | Status: AC
  Administered 2016-06-06: 1 g via INTRAVENOUS
  Filled 2016-06-06: qty 50

## 2016-06-06 NOTE — Discharge Instructions (Signed)
Return to the ER if you develop persistent fevers, vomiting, rapidly spreading redness appear arm or no improvement over the next 2-3 days. Complete antibiotics. For severe pain take norco or vicodin however realize they have the potential for addiction and it can make you sleepy and has tylenol in it.  No operating machinery while taking. If you were given medicines take as directed.  If you are on coumadin or contraceptives realize their levels and effectiveness is altered by many different medicines.  If you have any reaction (rash, tongues swelling, other) to the medicines stop taking and see a physician.    If your blood pressure was elevated in the ER make sure you follow up for management with a primary doctor or return for chest pain, shortness of breath or stroke symptoms.  Please follow up as directed and return to the ER or see a physician for new or worsening symptoms.  Thank you. Vitals:   06/06/16 1404 06/06/16 1651  BP: 184/64 186/91  Pulse: 79 84  Resp: 20 22  Temp: 98.7 F (37.1 C) 98.3 F (36.8 C)  TempSrc: Oral Oral  SpO2: 100% 96%

## 2016-06-06 NOTE — Patient Outreach (Signed)
   Telephone call made to patient to remind him of plans to meet him at his physicians appointment. Patient is currently living in a hotel, does not have permanent housing, prefers to meet at primary care's office.

## 2016-06-06 NOTE — ED Notes (Signed)
ED Provider at bedside. 

## 2016-06-06 NOTE — ED Provider Notes (Addendum)
[facility] DEPT Provider Note   CSN: [id number] Arrival date & time: 06/06/16  1357     History   Chief Complaint Chief Complaint  Patient presents with  . Hand Pain    HPI Kevin Schwartz is a 83 y.o. male.  Patient presents with right hand tenderness and swelling since yesterday. No history of joint disease or joint infection. No injury. No fevers or chills or vomiting. Pain to palpation.      Past Medical History:  Diagnosis Date  . CAD (coronary artery disease)    PTCA of OM in 1998, BMS OM in 2000, Cath 9/07 LM ok LAD ok. LCX 95% in OM prior to previous stent RCA. nondominant normal EF normal. Cypher DES to OM 2007 (PLACED PROXIAMAL TO PREVIOUS STENT)  . Chronic kidney disease, stage III (moderate)   . Chronic kidney disease, unspecified   . Coronary atherosclerosis of unspecified type of vessel, native or graft   . Depression   . DM type 2 (diabetes mellitus, type 2) ([place])   . DM type 2 causing CKD stage 2 ([place]) 08/01/2011  . Gout, unspecified   . HLD (hyperlipidemia)   . HTN (hypertension)   . Loss of weight   . Memory loss   . MGUS (monoclonal gammopathy of unknown significance) 08/01/2011   IgG kappa dx 2002 8% plasma cells in bone marrow; no lesions on bone X-rays;  [name] Monoclonal paraproteinemia   . Osteoarthrosis, unspecified whether generalized or localized, unspecified site   . Other atopic dermatitis and related conditions   . Peripheral vascular disease, unspecified   . Personality change due to conditions classified elsewhere   . PVD (peripheral vascular disease) ([place])   . Renal insufficiency   . S/P coronary artery stent placement 08/01/2011  . Spasm of muscle   . Thyroid disease   . Unspecified disorder of kidney and ureter   . Unspecified hearing loss   . Urinary tract infection, site not specified     Patient Active Problem List   Diagnosis Date Noted  . Loss of weight 01/21/2016  . Heme positive stool 01/21/2016  . Heart murmur  08/18/2013  . Chronic kidney disease, stage III (moderate) 07/14/2013  . Hyperlipidemia LDL goal < 100 07/14/2013  . Essential hypertension, benign 07/14/2013  . Type II or unspecified type diabetes mellitus with renal manifestations, not stated as uncontrolled(250.40) 07/14/2013  . Insomnia 07/14/2013  . Angioedema of lips 12/26/2012  . MGUS (monoclonal gammopathy of unknown significance) 08/01/2011  . Gout 08/01/2011  . S/P coronary artery stent placement 08/01/2011  . DM type 2 causing CKD stage 2 ([place]) 08/01/2011  . Bradycardia 10/15/2010  . Mixed hyperlipidemia due to type 2 diabetes mellitus ([place]) 05/01/2010  . HYPERTENSION, UNSPECIFIED 05/01/2010  . CAD, NATIVE VESSEL 05/01/2010  . PVD 05/01/2010    Past Surgical History:  Procedure Laterality Date  . CAROTID STENT    . CORONARY ANGIOPLASTY    . TOTAL KNEE ARTHROPLASTY Left        Home Medications    Prior to Admission medications   Medication Sig Start Date End Date Taking? Authorizing Provider  allopurinol (ZYLOPRIM) 100 MG tablet Take 1 tablet (100 mg total) by mouth daily. 06/05/15   Tiffany L Reed, DO  amLODipine (NORVASC) 2.5 MG tablet Take 2 tablets (5 mg total) by mouth daily. 06/05/15   Tiffany L Reed, DO  aspirin 81 MG tablet Take 1 tablet (81 mg total) by mouth daily. 09/22/14   Tiffany L  Reed, DO  atorvastatin (LIPITOR) 80 MG tablet Take 1 tablet (80 mg total) by mouth daily. For High Cholesterol 06/05/15   Tiffany L Reed, DO  cephALEXin (KEFLEX) 500 MG capsule Take 1 capsule (500 mg total) by mouth 2 (two) times daily. 06/07/16   Elnora Morrison, MD  citalopram (CELEXA) 20 MG tablet Take 1 tablet (20 mg total) by mouth daily. 06/04/15   Tiffany L Reed, DO  HYDROcodone-acetaminophen (NORCO) 5-325 MG tablet Take 1 tablet by mouth every 6 (six) hours as needed for severe pain. 06/06/16   Elnora Morrison, MD  metFORMIN (GLUCOPHAGE) 500 MG tablet Take one tablet by mouth once daily with breakfast to control blood sugar  06/04/15   Tiffany L Reed, DO  metoprolol (LOPRESSOR) 50 MG tablet Take one tablet by mouth twice daily for blood pressure 06/04/15   Tiffany L Reed, DO  nitroGLYCERIN (NITROSTAT) 0.4 MG SL tablet Place 0.4 mg under the tongue every 5 (five) minutes as needed for chest pain. Up to 3 doses    Historical Provider, MD  ONE TOUCH ULTRA TEST test strip Use to test blood sugar once daily 08/29/14   Historical Provider, MD  ULTRA-THIN II MINI PEN NEEDLE 31G X 5 MM [place]  08/29/14   Historical Provider, MD    Family History Family History  Problem Relation Age of Onset  . Heart disease Mother   . Heart disease Sister   . Heart disease Sister   . Cancer Sister     type unknown  . Hypertension Sister   . Diabetes Brother   . Diabetes Brother   . Diabetes    . Coronary artery disease    . Cancer      Social History Social History  Substance Use Topics  . Smoking status: Former Smoker    Types: Cigarettes    Quit date: 02/01/2016  . Smokeless tobacco: Never Used  . Alcohol use No     Allergies   Ace inhibitors; Losartan potassium; and Crestor [rosuvastatin]   Review of Systems Review of Systems  Constitutional: Negative for chills and fever.  Respiratory: Negative for shortness of breath.   Cardiovascular: Negative for chest pain.  Gastrointestinal: Negative for abdominal pain and vomiting.  Genitourinary: Negative for dysuria and flank pain.  Musculoskeletal: Positive for joint swelling. Negative for back pain, neck pain and neck stiffness.  Skin: Negative for rash.  Neurological: Negative for light-headedness and headaches.     Physical Exam Updated Vital Signs BP 179/85   Pulse 81   Temp 98.3 F (36.8 C) (Oral)   Resp 20   SpO2 98%   Physical Exam  Constitutional: He appears well-developed and well-nourished.  HENT:  Head: Normocephalic and atraumatic.  Eyes: Conjunctivae are normal.  Neck: Neck supple.  Cardiovascular: Normal rate and regular rhythm.     Pulmonary/Chest: Effort normal and breath sounds normal. No respiratory distress.  Abdominal: Soft. There is no tenderness.  Musculoskeletal: He exhibits edema and tenderness.  Patient has tenderness and mild edema to the dorsum right hand and wrist pain to palpation and movement of proximal hand and wrist. Decreased extension and flexion due to pain. Mild warmth and erythema the dorsal aspect. No open wounds. Neurovascularly intact.  Neurological: He is alert.  Skin: Skin is warm and dry.  Psychiatric: He has a normal mood and affect.  Nursing note and vitals reviewed.    ED Treatments / Results  Labs (all labs ordered are listed, but only abnormal results are displayed)  Labs Reviewed  C-REACTIVE PROTEIN - Abnormal; Notable for the following:       Result Value   CRP 5.3 (*)    All other components within normal limits  CBC WITH DIFFERENTIAL/PLATELET - Abnormal; Notable for the following:    RBC 4.18 (*)    Hemoglobin 12.7 (*)    HCT 38.3 (*)    Neutro Abs 9.0 (*)    All other components within normal limits  BASIC METABOLIC PANEL - Abnormal; Notable for the following:    CO2 20 (*)    Creatinine, Ser 1.45 (*)    GFR calc non Af Amer 43 (*)    GFR calc Af Amer 50 (*)    All other components within normal limits  SEDIMENTATION RATE    EKG  EKG Interpretation None       Radiology Dg Hand Complete Right  Result Date: 06/06/2016 CLINICAL DATA:  Generalized right hand pain and swelling for 1 day. EXAM: RIGHT HAND - COMPLETE 3+ VIEW COMPARISON:  Arthritic changes are noted at the MCP joints, particularly the first, second, and third. There right lesions and large osteophytes. Bone mineralization is normal. Inter Ph[place] deal joints FINDINGS: Arthritic changes are noted at the MCP joints, particular the first, second, and third MCP joints. Erosive lesions enlarge osteophytes are present. A posterior osteophyte is noted at the third metacarpal. Bone mineralization is normal.  Chondrocalcinosis is present within the TFCC. Degenerative changes are present at the first [facility]. No acute abnormalities are present. IMPRESSION: 1. No acute abnormality. 2. Atypical arthritic changes as described. The findings suggest CPPD arthropathy. Atypical osteoarthritis is also considered. Electronically Signed   By: San Morelle M.D.   On: 06/06/2016 14:48    Procedures Procedures (including critical care time) EMERGENCY DEPARTMENT US SOFT TISSUE INTERPRETATION "Study: Limited Soft Tissue Ultrasound"  INDICATIONS: Pain and Soft tissue infection Multiple views of the body part were obtained in real-time with a multi-frequency linear probe  PERFORMED BY: Myself IMAGES ARCHIVED?: Yes SIDE:Right  BODY PART:Upper extremity INTERPRETATION:  No abcess noted and Cellulitis present no joint effusion of right wrist    Medications Ordered in ED Medications  fentaNYL (SUBLIMAZE) injection 25 mcg (25 mcg Intravenous Given 06/06/16 1821)  ceFAZolin (ANCEF) IVPB 1 g/50 mL premix (not administered)     Initial Impression / Assessment and Plan / ED Course  I have reviewed the triage vital signs and the nursing notes.  Pertinent labs & imaging results that were available during my care of the patient were reviewed by me and considered in my medical decision making (see chart for details).    Patient presents with clinical concern for cellulitis first less likely joint infection. Bedside ultrasound did not reveal significant effusion to the wrist. Discussed clinically most likely cellulitis and strict follow-up and return precautions. Plan for oral antibiotics. \ Results and differential diagnosis were discussed with the patient/parent/guardian. Xrays were independently reviewed by myself.  Close follow up outpatient was discussed, comfortable with the plan.   Medications  fentaNYL (SUBLIMAZE) injection 25 mcg (25 mcg Intravenous Given 06/06/16 1821)  ceFAZolin (ANCEF) IVPB 1 g/50  mL premix (not administered)    Vitals:   06/06/16 1404 06/06/16 1651 06/06/16 1700 06/06/16 1805  BP: 184/64 186/91 (!) 170/102 179/85  Pulse: 79 84 (!) 43 81  Resp: 20 22 17 20   Temp: 98.7 F (37.1 C) 98.3 F (36.8 C)    TempSrc: Oral Oral    SpO2: 100% 96% (!) 77% 98%  Final diagnoses:  Cellulitis of right hand  Right hand pain     Final Clinical Impressions(s) / ED Diagnoses   Final diagnoses:  Cellulitis of right hand  Right hand pain    New Prescriptions New Prescriptions   CEPHALEXIN (KEFLEX) 500 MG CAPSULE    Take 1 capsule (500 mg total) by mouth 2 (two) times daily.   HYDROCODONE-ACETAMINOPHEN (NORCO) 5-325 MG TABLET    Take 1 tablet by mouth every 6 (six) hours as needed for severe pain.     Elnora Morrison, MD 06/06/16 1754    Elnora Morrison, MD 06/06/16 [phone]

## 2016-06-06 NOTE — ED Triage Notes (Signed)
Pt presents with right hand pain and swelling. Hand feels warm to the touch, onset yesterday. Denies recent injury. + radial pulse and able to wiggle his fingers.

## 2016-06-08 ENCOUNTER — Emergency Department (HOSPITAL_COMMUNITY)
Admission: EM | Admit: 2016-06-08 | Discharge: 2016-06-08 | Disposition: A | Discharge status: Home / Self Care | Payer: Medicare HMO | Attending: Emergency Medicine | Admitting: Emergency Medicine

## 2016-06-08 ENCOUNTER — Encounter (HOSPITAL_COMMUNITY): Payer: Self-pay | Admitting: Emergency Medicine

## 2016-06-08 DIAGNOSIS — Z87891 Personal history of nicotine dependence: Secondary | ICD-10-CM | POA: Insufficient documentation

## 2016-06-08 DIAGNOSIS — M79641 Pain in right hand: Secondary | ICD-10-CM | POA: Diagnosis not present

## 2016-06-08 DIAGNOSIS — M25541 Pain in joints of right hand: Secondary | ICD-10-CM | POA: Diagnosis not present

## 2016-06-08 DIAGNOSIS — E1122 Type 2 diabetes mellitus with diabetic chronic kidney disease: Secondary | ICD-10-CM | POA: Insufficient documentation

## 2016-06-08 DIAGNOSIS — I129 Hypertensive chronic kidney disease with stage 1 through stage 4 chronic kidney disease, or unspecified chronic kidney disease: Secondary | ICD-10-CM | POA: Insufficient documentation

## 2016-06-08 DIAGNOSIS — Z7982 Long term (current) use of aspirin: Secondary | ICD-10-CM | POA: Insufficient documentation

## 2016-06-08 DIAGNOSIS — Z96652 Presence of left artificial knee joint: Secondary | ICD-10-CM | POA: Insufficient documentation

## 2016-06-08 DIAGNOSIS — I2510 Atherosclerotic heart disease of native coronary artery without angina pectoris: Secondary | ICD-10-CM | POA: Insufficient documentation

## 2016-06-08 DIAGNOSIS — Z955 Presence of coronary angioplasty implant and graft: Secondary | ICD-10-CM | POA: Diagnosis not present

## 2016-06-08 DIAGNOSIS — N183 Chronic kidney disease, stage 3 (moderate): Secondary | ICD-10-CM | POA: Insufficient documentation

## 2016-06-08 LAB — CBC WITH DIFFERENTIAL/PLATELET
Basophils Absolute: 0 10*3/uL (ref 0.0–0.1)
Basophils Relative: 0 %
Eosinophils Absolute: 0.2 10*3/uL (ref 0.0–0.7)
Eosinophils Relative: 2 %
HCT: 35.8 % — ABNORMAL LOW (ref 39.0–52.0)
Hemoglobin: 12.1 g/dL — ABNORMAL LOW (ref 13.0–17.0)
Lymphocytes Relative: 11 %
Lymphs Abs: 1.2 10*3/uL (ref 0.7–4.0)
MCH: 30.6 pg (ref 26.0–34.0)
MCHC: 33.8 g/dL (ref 30.0–36.0)
MCV: 90.4 fL (ref 78.0–100.0)
Monocytes Absolute: 0.9 10*3/uL (ref 0.1–1.0)
Monocytes Relative: 8 %
Neutro Abs: 8.4 10*3/uL — ABNORMAL HIGH (ref 1.7–7.7)
Neutrophils Relative %: 79 %
Platelets: 345 10*3/uL (ref 150–400)
RBC: 3.96 MIL/uL — ABNORMAL LOW (ref 4.22–5.81)
RDW: 13.6 % (ref 11.5–15.5)
WBC: 10.7 10*3/uL — ABNORMAL HIGH (ref 4.0–10.5)

## 2016-06-08 MED ORDER — PREDNISONE 20 MG PO TABS
40.0000 mg | ORAL_TABLET | Freq: Once | ORAL | Status: AC
  Administered 2016-06-08: 40 mg via ORAL
  Filled 2016-06-08: qty 2

## 2016-06-08 MED ORDER — HYDROCODONE-ACETAMINOPHEN 5-325 MG PO TABS
2.0000 | ORAL_TABLET | Freq: Once | ORAL | Status: AC
  Administered 2016-06-08: 2 via ORAL
  Filled 2016-06-08: qty 2

## 2016-06-08 MED ORDER — PREDNISONE 20 MG PO TABS: ORAL_TABLET | ORAL | 0 refills | Status: DC

## 2016-06-08 MED ORDER — LIDOCAINE HCL (PF) 1 % IJ SOLN
5.0000 mL | Freq: Once | INTRAMUSCULAR | Status: AC
  Administered 2016-06-08: 5 mL via INTRADERMAL
  Filled 2016-06-08: qty 5

## 2016-06-08 MED ORDER — HYDROCODONE-ACETAMINOPHEN 5-325 MG PO TABS: 2.0000 | ORAL_TABLET | ORAL | 0 refills | Status: DC | PRN

## 2016-06-08 NOTE — ED Provider Notes (Signed)
[facility] DEPT Provider Note   CSN: [id number] Arrival date & time: 06/08/16  1424     History   Chief Complaint Chief Complaint  Patient presents with  . Hand Problem    HPI Kevin Schwartz is a 83 y.o. male. with history of CKD, type 2 diabetes and gout that presents to the ED for right hand pain. He was seen in the ED on Friday 06/06/16 for similar symptom. He was treated for cellulitis with Keflex for 7 days however he states that he is on day 3 of keflex.  He states that he continues to have right wrist pain and swelling that has gotten worse. He reports a history of gout and takes allopurinol occasionally. He states that on Friday he was able to extend and flex his wrist and today he is unable to. He states he is still able to move his fingers.   HPI  Past Medical History:  Diagnosis Date  . CAD (coronary artery disease)    PTCA of OM in 1998, BMS OM in 2000, Cath 9/07 LM ok LAD ok. LCX 95% in OM prior to previous stent RCA. nondominant normal EF normal. Cypher DES to OM 2007 (PLACED PROXIAMAL TO PREVIOUS STENT)  . Chronic kidney disease, stage III (moderate)   . Chronic kidney disease, unspecified   . Coronary atherosclerosis of unspecified type of vessel, native or graft   . Depression   . DM type 2 (diabetes mellitus, type 2) ([place])   . DM type 2 causing CKD stage 2 ([place]) 08/01/2011  . Gout, unspecified   . HLD (hyperlipidemia)   . HTN (hypertension)   . Loss of weight   . Memory loss   . MGUS (monoclonal gammopathy of unknown significance) 08/01/2011   IgG kappa dx 2002 8% plasma cells in bone marrow; no lesions on bone X-rays;  [name] Monoclonal paraproteinemia   . Osteoarthrosis, unspecified whether generalized or localized, unspecified site   . Other atopic dermatitis and related conditions   . Peripheral vascular disease, unspecified   . Personality change due to conditions classified elsewhere   . PVD (peripheral vascular disease) ([place])   . Renal insufficiency    . S/P coronary artery stent placement 08/01/2011  . Spasm of muscle   . Thyroid disease   . Unspecified disorder of kidney and ureter   . Unspecified hearing loss   . Urinary tract infection, site not specified     Patient Active Problem List   Diagnosis Date Noted  . Loss of weight 01/21/2016  . Heme positive stool 01/21/2016  . Heart murmur 08/18/2013  . Chronic kidney disease, stage III (moderate) 07/14/2013  . Hyperlipidemia LDL goal < 100 07/14/2013  . Essential hypertension, benign 07/14/2013  . Type II or unspecified type diabetes mellitus with renal manifestations, not stated as uncontrolled(250.40) 07/14/2013  . Insomnia 07/14/2013  . Angioedema of lips 12/26/2012  . MGUS (monoclonal gammopathy of unknown significance) 08/01/2011  . Gout 08/01/2011  . S/P coronary artery stent placement 08/01/2011  . DM type 2 causing CKD stage 2 ([place]) 08/01/2011  . Bradycardia 10/15/2010  . Mixed hyperlipidemia due to type 2 diabetes mellitus ([place]) 05/01/2010  . HYPERTENSION, UNSPECIFIED 05/01/2010  . CAD, NATIVE VESSEL 05/01/2010  . PVD 05/01/2010    Past Surgical History:  Procedure Laterality Date  . CAROTID STENT    . CORONARY ANGIOPLASTY    . TOTAL KNEE ARTHROPLASTY Left        Home Medications    Prior  to Admission medications   Medication Sig Start Date End Date Taking? Authorizing Provider  allopurinol (ZYLOPRIM) 100 MG tablet Take 1 tablet (100 mg total) by mouth daily. 06/05/15   Tiffany L Reed, DO  amLODipine (NORVASC) 2.5 MG tablet Take 2 tablets (5 mg total) by mouth daily. 06/05/15   Tiffany L Reed, DO  aspirin 81 MG tablet Take 1 tablet (81 mg total) by mouth daily. 09/22/14   Tiffany L Reed, DO  atorvastatin (LIPITOR) 80 MG tablet Take 1 tablet (80 mg total) by mouth daily. For High Cholesterol 06/05/15   Tiffany L Reed, DO  cephALEXin (KEFLEX) 500 MG capsule Take 1 capsule (500 mg total) by mouth 2 (two) times daily. 06/07/16   Elnora Morrison, MD  citalopram  (CELEXA) 20 MG tablet Take 1 tablet (20 mg total) by mouth daily. 06/04/15   Tiffany L Reed, DO  HYDROcodone-acetaminophen (NORCO) 5-325 MG tablet Take 1 tablet by mouth every 6 (six) hours as needed for severe pain. 06/06/16   Elnora Morrison, MD  HYDROcodone-acetaminophen (NORCO) 5-325 MG tablet Take 2 tablets by mouth every 4 (four) hours as needed. 06/08/16   Elnora Morrison, MD  metFORMIN (GLUCOPHAGE) 500 MG tablet Take one tablet by mouth once daily with breakfast to control blood sugar 06/04/15   Tiffany L Reed, DO  metoprolol (LOPRESSOR) 50 MG tablet Take one tablet by mouth twice daily for blood pressure 06/04/15   Tiffany L Reed, DO  nitroGLYCERIN (NITROSTAT) 0.4 MG SL tablet Place 0.4 mg under the tongue every 5 (five) minutes as needed for chest pain. Up to 3 doses    Historical Provider, MD  ONE TOUCH ULTRA TEST test strip Use to test blood sugar once daily 08/29/14   Historical Provider, MD  predniSONE (DELTASONE) 20 MG tablet 2 tabs po daily x 4 days 06/08/16   Elnora Morrison, MD  ULTRA-THIN II MINI PEN NEEDLE 31G X 5 MM MISC  08/29/14   Historical Provider, MD    Family History Family History  Problem Relation Age of Onset  . Heart disease Mother   . Heart disease Sister   . Heart disease Sister   . Cancer Sister     type unknown  . Hypertension Sister   . Diabetes Brother   . Diabetes Brother   . Diabetes    . Coronary artery disease    . Cancer      Social History Social History  Substance Use Topics  . Smoking status: Former Smoker    Types: Cigarettes    Quit date: 02/01/2016  . Smokeless tobacco: Never Used  . Alcohol use No     Allergies   Ace inhibitors; Losartan potassium; and Crestor [rosuvastatin]   Review of Systems Review of Systems  Constitutional: Negative for fever.  Respiratory: Negative for shortness of breath.   Cardiovascular: Negative for leg swelling.  Gastrointestinal: Negative for abdominal pain.  Musculoskeletal: Negative for arthralgias and  myalgias.  Neurological: Negative for weakness and numbness.     Physical Exam Updated Vital Signs BP 158/56   Pulse 62   Temp 98.5 F (36.9 C) (Oral)   Resp 15   Ht 5\' 7"  (1.702 m)   SpO2 100%   Physical Exam  Constitutional: He appears well-developed and well-nourished.  Cardiovascular: Normal rate, regular rhythm and normal heart sounds.  Exam reveals no friction rub.   No murmur heard. Pulmonary/Chest: Effort normal and breath sounds normal. No respiratory distress. He has no wheezes. He has no rales.  Abdominal: Soft. He exhibits no distension. There is no tenderness.  Musculoskeletal: He exhibits no edema.  Skin:  Right wrist warm to the touch surrounding the wrist joint as well as swelling. Active extension of wrist, passive extension of wrist Passive flexion and extension of fingers Strong radial pulses bilaterally      ED Treatments / Results  Labs (all labs ordered are listed, but only abnormal results are displayed) Labs Reviewed  CBC WITH DIFFERENTIAL/PLATELET - Abnormal; Notable for the following:       Result Value   WBC 10.7 (*)    RBC 3.96 (*)    Hemoglobin 12.1 (*)    HCT 35.8 (*)    Neutro Abs 8.4 (*)    All other components within normal limits  CULTURE, BLOOD (ROUTINE X 2)  CULTURE, BLOOD (ROUTINE X 2)    EKG  EKG Interpretation None       Radiology No results found.  Procedures Procedures (including critical care time)  Medications Ordered in ED Medications  lidocaine (PF) (XYLOCAINE) 1 % injection 5 mL (5 mLs Intradermal Given by Other 06/08/16 1853)  predniSONE (DELTASONE) tablet 40 mg (40 mg Oral Given 06/08/16 1853)  HYDROcodone-acetaminophen (NORCO/VICODIN) 5-325 MG per tablet 2 tablet (2 tablets Oral Given 06/08/16 1852)     Initial Impression / Assessment and Plan / ED Course  I have reviewed the triage vital signs and the nursing notes.  Pertinent labs & imaging results that were available during my care of the patient  were reviewed by me and considered in my medical decision making (see chart for details).   Patient presents to the ED with right hand pain located to the wrist.  It is warm with swelling.  He tried three days of keflex without benefit.  He was told to finish his 7 day course of keflex.  He had an ultrasound guided needle joint aspiration which produced no fluid.  Blood cultures were drawn.  He does have a history of gout and is not taking allopurinol regularly.  Gave patient a trial of prednisone with a wrist brace to immobilize joint and told to follow up with PCP after discharge.  He stated he had an appointment with his PCP tomorrow.   Final Clinical Impressions(s) / ED Diagnoses   Final diagnoses:  Right hand pain    New Prescriptions Discharge Medication List as of 06/08/2016  6:48 PM    START taking these medications   Details  !! HYDROcodone-acetaminophen (NORCO) 5-325 MG tablet Take 2 tablets by mouth every 4 (four) hours as needed., Starting Sun 06/08/2016, Print    predniSONE (DELTASONE) 20 MG tablet 2 tabs po daily x 4 days, Print     !! - Potential duplicate medications found. Please discuss with provider.       Valinda Party, DO 06/10/16 [address] [place], DO 06/10/16 1651    Elnora Morrison, MD 06/13/16 1224    Elnora Morrison, MD 06/28/16 [phone]

## 2016-06-08 NOTE — Discharge Instructions (Signed)
Finish your antibiotics and follow-up closely with primary doctor and orthopedic doctor. For severe pain take norco or vicodin however realize they have the potential for addiction and it can make you sleepy and has tylenol in it.  No operating machinery while taking.  If you were given medicines take as directed.  If you are on coumadin or contraceptives realize their levels and effectiveness is altered by many different medicines.  If you have any reaction (rash, tongues swelling, other) to the medicines stop taking and see a physician.    If your blood pressure was elevated in the ER make sure you follow up for management with a primary doctor or return for chest pain, shortness of breath or stroke symptoms.  Please follow up as directed and return to the ER or see a physician for new or worsening symptoms.  Thank you. Vitals:   06/08/16 1431 06/08/16 1433 06/08/16 1730  BP: 159/76    Pulse: 101  62  Resp: 17  15  Temp: 98.5 F (36.9 C)    TempSrc: Oral    SpO2: 97%  100%  Height:  5\' 7"  (1.702 m)

## 2016-06-08 NOTE — ED Triage Notes (Signed)
Pt. Stated, I was here on Friday for the same problem, my right hand is swolen and feels like its going up my arm.  Right hand swollen.

## 2016-06-09 ENCOUNTER — Other Ambulatory Visit: Payer: Self-pay

## 2016-06-09 ENCOUNTER — Encounter: Payer: Self-pay | Admitting: Internal Medicine

## 2016-06-09 ENCOUNTER — Ambulatory Visit: Payer: Medicare HMO | Admitting: Internal Medicine

## 2016-06-09 NOTE — Patient Outreach (Signed)
[facility]) Care Management  06/09/2016  Kevin Schwartz 10-31-1930 [id number]   This [name] arrived at [facility] at 225 pm as previously scheduled with paitent. This [name] spoke with receptionist, advised her I was there to meet with patient, [name] by receptionist patient had not arrived for this appointment.  This [name] waiting for patient until 250 pm, made call to patient who stated he thought his appointment was 330 pm. Receptionist advised this [name] to tell patient he would have to reschedule appointment.  This [name] has made multiple unsuccessful attempts to contact patient. Plan: Discharge patient from my caseload due to inability to maintain contact with patient

## 2016-06-13 LAB — CULTURE, BLOOD (ROUTINE X 2)
Culture: NO GROWTH
Culture: NO GROWTH

## 2016-06-16 ENCOUNTER — Encounter: Payer: Self-pay | Admitting: Internal Medicine

## 2016-06-16 ENCOUNTER — Ambulatory Visit (INDEPENDENT_AMBULATORY_CARE_PROVIDER_SITE_OTHER): Payer: Medicare HMO | Admitting: Internal Medicine

## 2016-06-16 VITALS — BP 142/70 | HR 80 | Temp 98.0°F | Ht 67.0 in | Wt 168.0 lb

## 2016-06-16 DIAGNOSIS — I10 Essential (primary) hypertension: Secondary | ICD-10-CM

## 2016-06-16 DIAGNOSIS — R634 Abnormal weight loss: Secondary | ICD-10-CM | POA: Diagnosis not present

## 2016-06-16 DIAGNOSIS — E1122 Type 2 diabetes mellitus with diabetic chronic kidney disease: Secondary | ICD-10-CM | POA: Diagnosis not present

## 2016-06-16 DIAGNOSIS — N182 Chronic kidney disease, stage 2 (mild): Secondary | ICD-10-CM | POA: Diagnosis not present

## 2016-06-16 DIAGNOSIS — M25531 Pain in right wrist: Secondary | ICD-10-CM

## 2016-06-16 LAB — HEMOGLOBIN A1C
Hgb A1c MFr Bld: 5.4 % (ref <5.7)
Mean Plasma Glucose: 108 mg/dL

## 2016-06-16 LAB — BASIC METABOLIC PANEL
BUN: 17 mg/dL (ref 7–25)
CO2: 28 mmol/L (ref 20–31)
Calcium: 9.2 mg/dL (ref 8.6–10.3)
Chloride: 103 mmol/L (ref 98–110)
Creat: 1.31 mg/dL — ABNORMAL HIGH (ref 0.70–1.11)
Glucose, Bld: 74 mg/dL (ref 65–99)
Potassium: 5 mmol/L (ref 3.5–5.3)
Sodium: 137 mmol/L (ref 135–146)

## 2016-06-16 LAB — URIC ACID: Uric Acid, Serum: 6.8 mg/dL (ref 4.0–8.0)

## 2016-06-16 NOTE — Progress Notes (Signed)
Location:  [facility] clinic Provider:  Anay Walter L. Mariea Clonts, D.O., C.M.D.  Code Status: full code Goals of Care:  Advanced Directives 06/08/2016  Does Patient Have a Medical Advance Directive? No  Type of Advance Directive -  Does patient want to make changes to medical advance directive? -  Copy of [place] in Chart? -  Would patient like information on creating a medical advance directive? -     Chief Complaint  Patient presents with  . Follow-up    discuss weight    HPI: Patient is a 83 y.o. male seen today for medical management of chronic diseases.    Weight today 168 lb, unchanged from January. Was seen back in November concerned about 2 lb weight loss. Saw GI, had a polyp removed, that was negative for malignancy. Weight loss has stopped. Is still living in a motel, plans to move to an apartment later this week. Is eating 2 meals a day (oatmeal in morning with raisins, and something for dinner, but breakfast is the biggest meal).   Is wearing right wrist splint. Woke up 1 week ago with swollen right wrist, and was aching. Thought something may have bite him?  Went to ED and was discharged (2/23). Went back on Sunday, and given more meds (prednisone), but discharged. He had been taking keflex for 3 days, and told to finish 7 days course. He had an US guided needle joint aspiration, that prodcued no fluid. Now swelling is gone, but still aching. Does report pain to right thumb, worse when he grips things. Does have a history of gout, reports regularly taking allopurinol.  On 2/23, sed rate: 85; CRP 5.3.   Is taking medications as prescribed. Denies CP, SOB. Denies abdominal pain, constipation or diarrhea.    Past Medical History:  Diagnosis Date  . CAD (coronary artery disease)    PTCA of OM in 1998, BMS OM in 2000, Cath 9/07 LM ok LAD ok. LCX 95% in OM prior to previous stent RCA. nondominant normal EF normal. Cypher DES to OM 2007 (PLACED PROXIAMAL TO PREVIOUS  STENT)  . Chronic kidney disease, stage III (moderate)   . Chronic kidney disease, unspecified   . Coronary atherosclerosis of unspecified type of vessel, native or graft   . Depression   . DM type 2 (diabetes mellitus, type 2) ([place])   . DM type 2 causing CKD stage 2 ([place]) 08/01/2011  . Gout, unspecified   . HLD (hyperlipidemia)   . HTN (hypertension)   . Loss of weight   . Memory loss   . MGUS (monoclonal gammopathy of unknown significance) 08/01/2011   IgG kappa dx 2002 8% plasma cells in bone marrow; no lesions on bone X-rays;  [name] Monoclonal paraproteinemia   . Osteoarthrosis, unspecified whether generalized or localized, unspecified site   . Other atopic dermatitis and related conditions   . Peripheral vascular disease, unspecified   . Personality change due to conditions classified elsewhere   . PVD (peripheral vascular disease) ([place])   . Renal insufficiency   . S/P coronary artery stent placement 08/01/2011  . Spasm of muscle   . Thyroid disease   . Unspecified disorder of kidney and ureter   . Unspecified hearing loss   . Urinary tract infection, site not specified     Past Surgical History:  Procedure Laterality Date  . CAROTID STENT    . CORONARY ANGIOPLASTY    . TOTAL KNEE ARTHROPLASTY Left     Allergies  Allergen Reactions  . Ace Inhibitors Swelling    angioedema  . Losartan Potassium Swelling    Angioedema   . Crestor [Rosuvastatin] Swelling    Allergies as of 06/16/2016      Reactions   Ace Inhibitors Swelling   angioedema   Losartan Potassium Swelling   Angioedema   Crestor [rosuvastatin] Swelling      Medication List       Accurate as of 06/16/16  3:17 PM. Always use your most recent med list.          allopurinol 100 MG tablet Commonly known as:  ZYLOPRIM Take 1 tablet (100 mg total) by mouth daily.   amLODipine 2.5 MG tablet Commonly known as:  NORVASC Take 2 tablets (5 mg total) by mouth daily.   aspirin 81 MG tablet Take 1 tablet (81  mg total) by mouth daily.   atorvastatin 80 MG tablet Commonly known as:  LIPITOR Take 1 tablet (80 mg total) by mouth daily. For High Cholesterol   citalopram 20 MG tablet Commonly known as:  CELEXA Take 1 tablet (20 mg total) by mouth daily.   HYDROcodone-acetaminophen 5-325 MG tablet Commonly known as:  NORCO Take 2 tablets by mouth every 4 (four) hours as needed.   metFORMIN 500 MG tablet Commonly known as:  GLUCOPHAGE Take one tablet by mouth once daily with breakfast to control blood sugar   metoprolol 50 MG tablet Commonly known as:  LOPRESSOR Take one tablet by mouth twice daily for blood pressure   nitroGLYCERIN 0.4 MG SL tablet Commonly known as:  NITROSTAT Place 0.4 mg under the tongue every 5 (five) minutes as needed for chest pain. Up to 3 doses   ONE TOUCH ULTRA TEST test strip Generic drug:  glucose blood Use to test blood sugar once daily   ULTRA-THIN II MINI PEN NEEDLE 31G X 5 MM Misc Generic drug:  Insulin Pen Needle       Review of Systems:  Review of Systems  Constitutional: Negative for chills, fever, malaise/fatigue and weight loss.  HENT: Negative for hearing loss.   Eyes: Negative for blurred vision.  Respiratory: Negative for cough, shortness of breath and wheezing.   Cardiovascular: Negative for chest pain and palpitations.  Gastrointestinal: Negative for abdominal pain, blood in stool, constipation and melena.  Genitourinary: Negative for dysuria.       Incontinence  Musculoskeletal: Negative for joint pain and myalgias.       Had previous wrist pain, but has subsided   Skin: Negative for rash.       Splint to right wrist, has had pain, swelling to right wrist.   Neurological: Negative for dizziness.  Endo/Heme/Allergies: Does not bruise/bleed easily.  Psychiatric/Behavioral: Positive for depression and memory loss.    Health Maintenance  Topic Date Due  . OPHTHALMOLOGY EXAM  04/14/2014  . URINE MICROALBUMIN  03/07/2015  .  TETANUS/TDAP  04/14/2020 (Originally 12/16/1949)  . HEMOGLOBIN A1C  07/14/2016  . FOOT EXAM  11/29/2016  . INFLUENZA VACCINE  Completed  . PNA vac Low Risk Adult  Completed    Physical Exam: Vitals:   06/16/16 1306 06/16/16 1403  BP: (!) 148/70 (!) 142/70  Pulse: 80   Temp: 98 F (36.7 C)   TempSrc: Oral   SpO2: 98%   Weight: 168 lb (76.2 kg)   Height: 5\' 7"  (1.702 m)    Body mass index is 26.31 kg/m. Physical Exam  Constitutional: He is oriented to person, place, and time. He  appears well-developed and well-nourished.  Cardiovascular: Normal rate, regular rhythm and normal heart sounds.   Pulmonary/Chest: Effort normal and breath sounds normal. No respiratory distress. He has no wheezes.  Abdominal: Bowel sounds are normal.  Musculoskeletal: Normal range of motion. He exhibits no edema or tenderness.  No tenderness or swelling to right wrist or right thumb. Is appropriately warm.    Neurological: He is alert and oriented to person, place, and time.  Skin: Skin is warm and dry. Capillary refill takes less than 2 seconds.    Labs reviewed: Basic Metabolic Panel:  Recent Labs  01/14/16 1226 06/06/16 1707  NA 141 138  K 4.7 4.0  CL 107 106  CO2 26 20*  GLUCOSE 86 75  BUN 21 17  CREATININE 1.27* 1.45*  CALCIUM 10.1 9.4   Liver Function Tests:  Recent Labs  07/30/15 0919 01/14/16 1226  AST  --  32  ALT  --  28  ALKPHOS  --  72  BILITOT  --  0.3  PROT 7.5 7.2  ALBUMIN  --  4.0   No results for input(s): LIPASE, AMYLASE in the last 8760 hours. No results for input(s): AMMONIA in the last 8760 hours. CBC:  Recent Labs  01/14/16 1226 06/06/16 1707 06/08/16 1714  WBC 6.6 10.4 10.7*  NEUTROABS 4,554 9.0* 8.4*  HGB 12.2* 12.7* 12.1*  HCT 36.8* 38.3* 35.8*  MCV 91.3 91.6 90.4  PLT 413* 322 345   Lipid Panel: No results for input(s): CHOL, HDL, LDLCALC, TRIG, CHOLHDL, LDLDIRECT in the last 8760 hours. Lab Results  Component Value Date   HGBA1C 5.4  01/14/2016    Procedures since last visit: Dg Hand Complete Right  Result Date: 06/06/2016 CLINICAL DATA:  Generalized right hand pain and swelling for 1 day. EXAM: RIGHT HAND - COMPLETE 3+ VIEW COMPARISON:  Arthritic changes are noted at the MCP joints, particularly the first, second, and third. There right lesions and large osteophytes. Bone mineralization is normal. Inter [place] deal joints FINDINGS: Arthritic changes are noted at the MCP joints, particular the first, second, and third MCP joints. Erosive lesions enlarge osteophytes are present. A posterior osteophyte is noted at the third metacarpal. Bone mineralization is normal. Chondrocalcinosis is present within the TFCC. Degenerative changes are present at the first [facility]. No acute abnormalities are present. IMPRESSION: 1. No acute abnormality. 2. Atypical arthritic changes as described. The findings suggest CPPD arthropathy. Atypical osteoarthritis is also considered. Electronically Signed   By: San Morelle M.D.   On: 06/06/2016 14:48    Assessment/Plan 1. Loss of weight - has stabilized, but still living at hotel despite multiple resources involved in helping him and eating only 2 meals per day--not sure he's being honest with me - Basic metabolic panel  2. Type 2 diabetes mellitus with stage 2 chronic kidney disease, without long-term current use of insulin (HCC) - cont statin, metformin, asa - Hemoglobin A1c today  3. Essential hypertension, benign -bp at goal with metoprolol normally--was not today, but I'm not so sure he really took his lopressor though he says he did  4. Acute pain of right wrist - suspect this was gout--did improve w/ prednisone and brace and has restarted his allopurinol which he'd stopped and he ate shrimp fried - Uric Acid - Basic metabolic panel - C-reactive Protein  Labs/tests ordered:   Orders Placed This Encounter  Procedures  . Uric Acid  . Basic metabolic panel  . C-reactive Protein    . Hemoglobin A1c  Next appt:  09/18/2016  Briyana Badman L. Gilberte Gorley, D.O. [facility] [address], [place] [address] Cell Phone (Mon-Fri 8am-5pm):  [phone] On Call:  [phone] & follow prompts after 5pm & weekends Office Phone:  [phone] Office Fax:  [phone]

## 2016-06-17 LAB — C-REACTIVE PROTEIN: CRP: 14.7 mg/L — ABNORMAL HIGH (ref <8.0)

## 2016-06-25 ENCOUNTER — Other Ambulatory Visit: Payer: Self-pay

## 2016-06-26 NOTE — Patient Outreach (Signed)
   Successful telephonic contact made to schedule home visit.  Plan: Visit scheduled to meet at [facility] in the next 21 days for assessment of community care coordination needs.

## 2016-06-27 ENCOUNTER — Other Ambulatory Visit: Payer: Self-pay | Admitting: Licensed Clinical Social Worker

## 2016-06-27 ENCOUNTER — Other Ambulatory Visit: Payer: Self-pay

## 2016-06-27 NOTE — Patient Outreach (Signed)
[place] [facility]) Care Management   06/27/2016  Kevin Schwartz [id number]  Kevin Schwartz is an 83 y.o. male  Subjective:  I am suppose to move into my apartment on Monday, the 19th.  Objective:   ROS Elderly, frail, guarded gentleman  Physical Exam  ROS  Encounter Medications:   Outpatient Encounter Prescriptions as of 06/27/2016  Medication Sig  . allopurinol (ZYLOPRIM) 100 MG tablet Take 1 tablet (100 mg total) by mouth daily.  [name] amLODipine (NORVASC) 2.5 MG tablet Take 2 tablets (5 mg total) by mouth daily.  [name] aspirin 81 MG tablet Take 1 tablet (81 mg total) by mouth daily.  [name] atorvastatin (LIPITOR) 80 MG tablet Take 1 tablet (80 mg total) by mouth daily. For High Cholesterol  . citalopram (CELEXA) 20 MG tablet Take 1 tablet (20 mg total) by mouth daily.  [name] HYDROcodone-acetaminophen (NORCO) 5-325 MG tablet Take 2 tablets by mouth every 4 (four) hours as needed.  . metFORMIN (GLUCOPHAGE) 500 MG tablet Take one tablet by mouth once daily with breakfast to control blood sugar  . metoprolol (LOPRESSOR) 50 MG tablet Take one tablet by mouth twice daily for blood pressure  . nitroGLYCERIN (NITROSTAT) 0.4 MG SL tablet Place 0.4 mg under the tongue every 5 (five) minutes as needed for chest pain. Up to 3 doses  . ONE TOUCH ULTRA TEST test strip Use to test blood sugar once daily  . ULTRA-THIN II MINI PEN NEEDLE 31G X 5 MM MISC    Facility-Administered Encounter Medications as of 06/27/2016  Medication  . 0.9 %  sodium chloride infusion    Functional Status:   In your present state of health, do you have any difficulty performing the following activities: 06/25/2016 05/14/2016  Hearing? [name]  Vision? Y Y  Difficulty concentrating or making decisions? N N  Walking or climbing stairs? Y Y  Dressing or bathing? N N  Doing errands, shopping? N Y  Conservation officer, nature and eating ? Y N  Using the Toilet? N N  In the past six months, have you accidently leaked  urine? N Y  Do you have problems with loss of bowel control? N N  Managing your Medications? N N  Managing your Finances? [name]  Housekeeping or managing your Housekeeping? N N  Some recent data might be hidden    Fall/Depression Screening:    PHQ 2/9 Scores 06/26/2016 05/14/2016 05/13/2016 05/09/2016 03/03/2016 08/30/2015 07/26/2015  PHQ - 2 Score 0 0 0 1 0 0 0   THN CM Care Plan Problem One     Most Recent Value  Care Plan Problem One  patient is currently living in a hotel  Role Documenting the Problem One  Care Management [place] for Problem One  Active  THN Long Term Goal (31-90 days)  In the next 31 days, patient will have moved into an apartment  [place] Term Goal Start Date  06/27/16  Interventions for Problem One Long Term Goal  initial visit with patient     [facility] CM Care Plan Problem Two     Most Recent Value  Care Plan Problem Two  patient has diabetes  Role Documenting the Problem Two  Care Management Coordinator  Care Plan for Problem Two  Active  THN CM Short Term Goal #1 (0-30 days)  In the next 28 days, patient will meet with [facility] [name] for diabetes education  [facility] CM Short Term Goal #1 Start Date  06/27/16  Interventions for Short Term Goal #2   Initial in person visit to establish goals    [facility] CM Care Plan Problem Three     Most Recent Value  Care Plan Problem Three  patient has heart failure diagnosis  Role Documenting the Problem Three  Care Management Coordinator  Care Plan for Problem Three  Active  THN CM Short Term Goal #1 (0-30 days)  In the next 28 patient will meet with [place] for heart failure education  [facility] CM Short Term Goal #1 Start Date  06/27/16     Fall Risk  06/26/2016 05/14/2016 05/13/2016 05/09/2016 03/03/2016  Falls in the past year? Yes No No No No  Number falls in past yr: 2 or more - - - -  Injury with Fall? No - - - -  Risk Factor Category  High Fall Risk - - - -  Risk for fall due to : History of fall(s);Impaired  balance/gait;Impaired mobility;Impaired vision;Medication side effect Impaired balance/gait;Medication side effect;Impaired vision - - -  Follow up Falls prevention discussed - - - -   Assessment:   Patient reports testing his glucose levels daily with a borrowed meter.  Patient states his glucometer is in storage. Patient was very attentive during this visit. [name] encouraged patient to go through with moving into stable housing so he can start cooking his own meals, rest as needed and able to maintain his hygiene. Patient states he has an apartment picked out in the [place] area away from people he knows.   Patient stated the apartment is gaited, he will be able to limit who has access to him. Patient and [name] collaborated to create his case management care plan, goals.  Plan:  Patient and [name] agreed to telephone contact in the next 21 days to assess his progress in moving into his apartment, schedule a home visit.

## 2016-06-27 NOTE — Patient Outreach (Signed)
[place] [facility]) Care Management  06/27/2016  Kevin Schwartz 1930-09-06 [id number]   Assessment- CSW received new referral on 06/27/16 to assist patient with advance directives. CSW completed initial outreach today but was unable to reach him. CSW left a HIPPA compliant voice message requesting return call in order to complete home visit.  Plan-CSW will await to hear back from patient or complete additional outreach within one week.  Eula Fried, BSW, MSW, [email or web address] Phone: [phone] Fax: [phone]

## 2016-06-30 ENCOUNTER — Other Ambulatory Visit: Payer: Self-pay | Admitting: Licensed Clinical Social Worker

## 2016-06-30 NOTE — Patient Outreach (Signed)
[place] [facility]) Care Management  06/30/2016  Kevin Schwartz 03/24/1931 [id number]  Assessment-CSW completed second outreach attempt today. CSW unable to reach patient successfully. CSW left a HIPPA compliant voice message encouraging patient to return call once available.  Plan-CSW will await return call or complete an additional outreach if needed.  Eula Fried, BSW, MSW, [email or web address] Phone: [phone] Fax: [phone]

## 2016-07-02 ENCOUNTER — Other Ambulatory Visit: Payer: Self-pay | Admitting: Licensed Clinical Social Worker

## 2016-07-02 NOTE — Patient Outreach (Signed)
[place] [facility]) Care Management  07/02/2016  DENT PLANTZ 1930/10/28 [id number]  Assessment-CSW completed third outreach attempt today. CSW unable to reach patient successfully. CSW left a HIPPA compliant voice message encouraging patient to return call once available.  Plan-CSW will await for return call or complete fourth and final outreach within one week.  Eula Fried, BSW, MSW, [email or web address] Phone: [phone] Fax: [phone]

## 2016-07-04 ENCOUNTER — Other Ambulatory Visit: Payer: Self-pay

## 2016-07-04 NOTE — Patient Outreach (Signed)
    Telephone contact with patient toassess his progress in finding stable, safe housing. Patient advises he did not move into the apartment in [place] as he anticipates. Patient reports reason for not moving is due to finances.  Patient states he is currently staying with his brother at [address] at night. Patient states his brother is sick right now.  Discussed with patient the  Benefits of financial planning, patient given information for [place] and credit counseling at [place], [place] Telephone number [phone]  Plan; Telephone contact in the next 21 days to asses his progress in securing safe housing, schedule appointment for diabetic and heart failure education.

## 2016-07-07 ENCOUNTER — Other Ambulatory Visit: Payer: Self-pay | Admitting: Licensed Clinical Social Worker

## 2016-07-07 NOTE — Patient Outreach (Signed)
[place] [facility]) Care Management  07/07/2016  Kevin Schwartz [birth year] [id number]  Assessment-CSW completed fourth and final outreach attempt today. CSW unable to reach patient successfully. CSW left a HIPPA compliant voice message encouraging patient to return call once available.  Plan-CSW will await return call and will mail outreach barrier letter at this time and will await 10 business days before completing discharge.  Eula Fried, BSW, MSW, [email or web address] Phone: [phone] Fax: [phone]

## 2016-07-25 ENCOUNTER — Other Ambulatory Visit: Payer: Self-pay | Admitting: Licensed Clinical Social Worker

## 2016-07-25 NOTE — Patient Outreach (Signed)
[place] [facility]) Care Management  07/25/2016  Kevin Schwartz Jul 24, 1930 [id number]  Assessment- CSW will complete social work discharge at this time. CSW has been unable to establish contact with patient and has sent outreach letter and waited over 10 business days to hear from patient.  Plan-CSW will update PCP of social work discharge. CSW will remove patient from caseload.  Eula Fried, BSW, MSW, [email or web address] Phone: [phone] Fax: [phone]

## 2016-07-28 ENCOUNTER — Other Ambulatory Visit: Payer: Self-pay

## 2016-07-29 ENCOUNTER — Other Ambulatory Visit: Payer: Self-pay

## 2016-07-29 NOTE — Patient Outreach (Signed)
   Telephone contact made with member to schedule visit with patient. Patient and [name] agreed on home visit on tomorrow, April 17

## 2016-07-29 NOTE — Patient Outreach (Signed)
[place] [facility]) Care Management   07/29/2016  Kevin Schwartz 1930-06-22 [id number]  Kevin Schwartz is an 83 y.o. male  Subjective:  I have been taking my blood sugars every day. I have not moved into an apartment.  I moved in with my brother, he has dementia.  Objective:   ROS  Well groomed elderly gentleman.   Physical Exam  ROS  Encounter Medications:   Outpatient Encounter Prescriptions as of 07/29/2016  Medication Sig  . allopurinol (ZYLOPRIM) 100 MG tablet Take 1 tablet (100 mg total) by mouth daily.  [name] amLODipine (NORVASC) 2.5 MG tablet Take 2 tablets (5 mg total) by mouth daily.  [name] aspirin 81 MG tablet Take 1 tablet (81 mg total) by mouth daily.  [name] atorvastatin (LIPITOR) 80 MG tablet Take 1 tablet (80 mg total) by mouth daily. For High Cholesterol  . citalopram (CELEXA) 20 MG tablet Take 1 tablet (20 mg total) by mouth daily.  [name] HYDROcodone-acetaminophen (NORCO) 5-325 MG tablet Take 2 tablets by mouth every 4 (four) hours as needed.  . metFORMIN (GLUCOPHAGE) 500 MG tablet Take one tablet by mouth once daily with breakfast to control blood sugar  . metoprolol (LOPRESSOR) 50 MG tablet Take one tablet by mouth twice daily for blood pressure  . nitroGLYCERIN (NITROSTAT) 0.4 MG SL tablet Place 0.4 mg under the tongue every 5 (five) minutes as needed for chest pain. Up to 3 doses  . ONE TOUCH ULTRA TEST test strip Use to test blood sugar once daily  . ULTRA-THIN II MINI PEN NEEDLE 31G X 5 MM MISC    Facility-Administered Encounter Medications as of 07/29/2016  Medication  . 0.9 %  sodium chloride infusion    Functional Status:   In your present state of health, do you have any difficulty performing the following activities: 06/25/2016 05/14/2016  Hearing? [name]  Vision? Y Y  Difficulty concentrating or making decisions? N N  Walking or climbing stairs? Y Y  Dressing or bathing? N N  Doing errands, shopping? N Y  Conservation officer, nature and eating ? Y N  Using the  Toilet? N N  In the past six months, have you accidently leaked urine? N Y  Do you have problems with loss of bowel control? N N  Managing your Medications? N N  Managing your Finances? [name]  Housekeeping or managing your Housekeeping? N N  Some recent data might be hidden    Fall/Depression Screening:    PHQ 2/9 Scores 06/26/2016 05/14/2016 05/13/2016 05/09/2016 03/03/2016 08/30/2015 07/26/2015  PHQ - 2 Score 0 0 0 1 0 0 0    THN CM Care Plan Problem One     Most Recent Value  Care Plan Problem One  patient is currently living in a hotel  Role Documenting the Problem One  Care Management [place] for Problem One  Active  THN Long Term Goal (31-90 days)  In the next 31 days, patient will have moved into an apartment  [place] Term Goal Start Date  06/27/16  [facility] Long Term Goal Met Date  07/29/16  Interventions for Problem One Long Term Goal  home visit to assess patient's progress in reaching his case management goals  THN CM Short Term Goal #1 (0-30 days)  In the next 21 days, patient will measure anddocument his glucose levels 14 times out of 21   THN CM Short Term Goal #1 Start Date  07/04/16  [facility] CM Short Term Goal #  1 Met Date  07/29/16  Interventions for Short Term Goal #1  Home visit for community care coordination. Patient has recordings of his glucose readings for 21 out of 21 times    [facility] CM Care Plan Problem Two     Most Recent Value  Care Plan Problem Two  patient has diabetes  Role Documenting the Problem Two  Care Management Coordinator  Care Plan for Problem Two  Active  THN CM Short Term Goal #1 (0-30 days)  In the next 28 days, patient will meet with [place] for diabetes education  [facility] CM Short Term Goal #1 Start Date  06/27/16  [facility] CM Short Term Goal #1 Met Date   07/29/16  Interventions for Short Term Goal #2   Initial in person visit to establish goals    [facility] CM Care Plan Problem Three     Most Recent Value  Care Plan Problem Three  patient has heart  failure diagnosis  Role Documenting the Problem Three  Care Management Coordinator  Care Plan for Problem Three  Active  THN CM Short Term Goal #1 (0-30 days)  In the next 28 patient will meet with [facility] [name] for heart failure education  [facility] CM Short Term Goal #1 Start Date  06/27/16  [facility] CM Short Term Goal #1 Met Date  07/29/16  Interventions for Short Term Goal #1  heart failure education completed, patient states he always seek medical attention from his primary care physician early     Assessment:   This [name] met patient in the community to assess his progress in meeting his case management goals. Patient reports to this [name] he has moved out the hotel, has moved in with brother on [address] where he stays at night. Patient states he is saving money by staying with this brother.  Plan:  Visit with patient in the next 30 days. Patient to continue to monitor and record his glucose levels

## 2016-07-30 NOTE — Telephone Encounter (Signed)
This encounter was created in error - please disregard.

## 2016-09-12 ENCOUNTER — Telehealth: Payer: Self-pay | Admitting: *Deleted

## 2016-09-12 NOTE — Telephone Encounter (Signed)
Received form from [facility] stating patient requested External Collection Pouch as an alternative to pads/diapers and wants Dr. To fill out and sign. Dr. Mariea Clonts stated that patient has not mentioned anything to her about this. I tried calling patient to confirm the requested supplies and had to [facility] to return call. Awaiting call back. (Form in Clinical Intake Message Notebook)

## 2016-09-18 ENCOUNTER — Encounter: Payer: Self-pay | Admitting: Internal Medicine

## 2016-09-18 ENCOUNTER — Ambulatory Visit (INDEPENDENT_AMBULATORY_CARE_PROVIDER_SITE_OTHER): Payer: Medicare HMO | Admitting: Internal Medicine

## 2016-09-18 VITALS — BP 136/70 | HR 72 | Temp 98.3°F | Wt 163.0 lb

## 2016-09-18 DIAGNOSIS — D472 Monoclonal gammopathy: Secondary | ICD-10-CM | POA: Diagnosis not present

## 2016-09-18 DIAGNOSIS — I2510 Atherosclerotic heart disease of native coronary artery without angina pectoris: Secondary | ICD-10-CM | POA: Diagnosis not present

## 2016-09-18 DIAGNOSIS — I10 Essential (primary) hypertension: Secondary | ICD-10-CM

## 2016-09-18 DIAGNOSIS — N182 Chronic kidney disease, stage 2 (mild): Secondary | ICD-10-CM

## 2016-09-18 DIAGNOSIS — M62838 Other muscle spasm: Secondary | ICD-10-CM | POA: Diagnosis not present

## 2016-09-18 DIAGNOSIS — E1122 Type 2 diabetes mellitus with diabetic chronic kidney disease: Secondary | ICD-10-CM | POA: Diagnosis not present

## 2016-09-18 DIAGNOSIS — N183 Chronic kidney disease, stage 3 unspecified: Secondary | ICD-10-CM

## 2016-09-18 DIAGNOSIS — E782 Mixed hyperlipidemia: Secondary | ICD-10-CM

## 2016-09-18 DIAGNOSIS — E1169 Type 2 diabetes mellitus with other specified complication: Secondary | ICD-10-CM | POA: Diagnosis not present

## 2016-09-18 LAB — COMPLETE METABOLIC PANEL WITH GFR
ALT: 20 U/L (ref 9–46)
AST: 21 U/L (ref 10–35)
Albumin: 3.9 g/dL (ref 3.6–5.1)
Alkaline Phosphatase: 79 U/L (ref 40–115)
BUN: 22 mg/dL (ref 7–25)
CO2: 23 mmol/L (ref 20–31)
Calcium: 9.3 mg/dL (ref 8.6–10.3)
Chloride: 108 mmol/L (ref 98–110)
Creat: 1.49 mg/dL — ABNORMAL HIGH (ref 0.70–1.11)
GFR, Est African American: 49 mL/min — ABNORMAL LOW (ref >=60)
GFR, Est Non African American: 43 mL/min — ABNORMAL LOW (ref >=60)
Glucose, Bld: 79 mg/dL (ref 65–99)
Potassium: 4.4 mmol/L (ref 3.5–5.3)
Sodium: 140 mmol/L (ref 135–146)
Total Bilirubin: 0.3 mg/dL (ref 0.2–1.2)
Total Protein: 7.6 g/dL (ref 6.1–8.1)

## 2016-09-18 LAB — CBC WITH DIFFERENTIAL/PLATELET
Basophils Absolute: 0 cells/uL (ref 0–200)
Basophils Relative: 0 %
Eosinophils Absolute: 252 cells/uL (ref 15–500)
Eosinophils Relative: 4 %
HCT: 36 % — ABNORMAL LOW (ref 38.5–50.0)
Hemoglobin: 11.9 g/dL — ABNORMAL LOW (ref 13.2–17.1)
Lymphocytes Relative: 23 %
Lymphs Abs: 1449 cells/uL (ref 850–3900)
MCH: 30.7 pg (ref 27.0–33.0)
MCHC: 33.1 g/dL (ref 32.0–36.0)
MCV: 92.8 fL (ref 80.0–100.0)
MPV: 9.5 fL (ref 7.5–12.5)
Monocytes Absolute: 378 cells/uL (ref 200–950)
Monocytes Relative: 6 %
Neutro Abs: 4221 cells/uL (ref 1500–7800)
Neutrophils Relative %: 67 %
Platelets: 532 10*3/uL — ABNORMAL HIGH (ref 140–400)
RBC: 3.88 MIL/uL — ABNORMAL LOW (ref 4.20–5.80)
RDW: 14.7 % (ref 11.0–15.0)
WBC: 6.3 10*3/uL (ref 3.8–10.8)

## 2016-09-18 LAB — LIPID PANEL
Cholesterol: 196 mg/dL (ref <200)
HDL: 48 mg/dL (ref >40)
LDL Cholesterol: 127 mg/dL — ABNORMAL HIGH (ref <100)
Total CHOL/HDL Ratio: 4.1 Ratio (ref <5.0)
Triglycerides: 107 mg/dL (ref <150)
VLDL: 21 mg/dL (ref <30)

## 2016-09-18 NOTE — Progress Notes (Signed)
Location:  [facility] clinic Provider:  Deanza Upperman L. Mariea Clonts, D.O., C.M.D.  Code Status: full code Goals of Care:  Advanced Directives 09/18/2016  Does Patient Have a Medical Advance Directive? No  Type of Advance Directive -  Does patient want to make changes to medical advance directive? -  Copy of [place] in Chart? -  Would patient like information on creating a medical advance directive? No - Patient declined   Chief Complaint  Patient presents with  . Medical Management of Chronic Issues    27mth follow-up    HPI: Patient is a 83 y.o. male seen today for medical management of chronic diseases.    He has moved in with his brother 2-3 mos ago, he says.  His brother has dementia--he is cooking and taking his brother to the doctor.  He's also helping him manage his finances.    DMII:  Checking sugars every other day--98, 85.  No lows.  No highs either.  Last hba1c 5.4.  Plans to see ophtho next week.  Says he feels well.  Spirits have been better lately--not worrying so much.  His weight is down to 163 from 168 lbs.    Reports sometimes has some urge incontinence.  He does want the form completed for adult undergarments.  Does not report urinary frequency.    BP is ok today.  On lopressor and amlodipine.    No gout flare-ups.  On allopurinol.  CAD/Hyperlipidemia:  On baby asa and lipitor.  Needs FLP for this year.  Refuses shingrix knowing he may have to pay some out of pocket.   Past Medical History:  Diagnosis Date  . CAD (coronary artery disease)    PTCA of OM in 1998, BMS OM in 2000, Cath 9/07 LM ok LAD ok. LCX 95% in OM prior to previous stent RCA. nondominant normal EF normal. Cypher DES to OM 2007 (PLACED PROXIAMAL TO PREVIOUS STENT)  . Chronic kidney disease, stage III (moderate)   . Chronic kidney disease, unspecified   . Coronary atherosclerosis of unspecified type of vessel, native or graft   . Depression   . DM type 2 (diabetes mellitus, type 2)  ([place])   . DM type 2 causing CKD stage 2 ([place]) 08/01/2011  . Gout, unspecified   . HLD (hyperlipidemia)   . HTN (hypertension)   . Loss of weight   . Memory loss   . MGUS (monoclonal gammopathy of unknown significance) 08/01/2011   IgG kappa dx 2002 8% plasma cells in bone marrow; no lesions on bone X-rays;  [name] Monoclonal paraproteinemia   . Osteoarthrosis, unspecified whether generalized or localized, unspecified site   . Other atopic dermatitis and related conditions   . Peripheral vascular disease, unspecified ([place])   . Personality change due to conditions classified elsewhere   . PVD (peripheral vascular disease) ([place])   . Renal insufficiency   . S/P coronary artery stent placement 08/01/2011  . Spasm of muscle   . Thyroid disease   . Unspecified disorder of kidney and ureter   . Unspecified hearing loss   . Urinary tract infection, site not specified     Past Surgical History:  Procedure Laterality Date  . CAROTID STENT    . CORONARY ANGIOPLASTY    . TOTAL KNEE ARTHROPLASTY Left     Allergies  Allergen Reactions  . Ace Inhibitors Swelling    angioedema  . Losartan Potassium Swelling    Angioedema   . Crestor [Rosuvastatin] Swelling  Allergies as of 09/18/2016      Reactions   Ace Inhibitors Swelling   angioedema   Losartan Potassium Swelling   Angioedema   Crestor [rosuvastatin] Swelling      Medication List       Accurate as of 09/18/16  8:08 AM. Always use your most recent med list.          allopurinol 100 MG tablet Commonly known as:  ZYLOPRIM Take 1 tablet (100 mg total) by mouth daily.   amLODipine 2.5 MG tablet Commonly known as:  NORVASC Take 2 tablets (5 mg total) by mouth daily.   aspirin 81 MG tablet Take 1 tablet (81 mg total) by mouth daily.   atorvastatin 80 MG tablet Commonly known as:  LIPITOR Take 1 tablet (80 mg total) by mouth daily. For High Cholesterol   citalopram 20 MG tablet Commonly known as:  CELEXA Take 1 tablet (20  mg total) by mouth daily.   HYDROcodone-acetaminophen 5-325 MG tablet Commonly known as:  NORCO Take 2 tablets by mouth every 4 (four) hours as needed.   metFORMIN 500 MG tablet Commonly known as:  GLUCOPHAGE Take one tablet by mouth once daily with breakfast to control blood sugar   metoprolol tartrate 50 MG tablet Commonly known as:  LOPRESSOR Take one tablet by mouth twice daily for blood pressure   nitroGLYCERIN 0.4 MG SL tablet Commonly known as:  NITROSTAT Place 0.4 mg under the tongue every 5 (five) minutes as needed for chest pain. Up to 3 doses   ONE TOUCH ULTRA TEST test strip Generic drug:  glucose blood Use to test blood sugar once daily   ULTRA-THIN II MINI PEN NEEDLE 31G X 5 MM Misc Generic drug:  Insulin Pen Needle       Review of Systems:  Review of Systems  Constitutional: Positive for weight loss. Negative for chills, fever and malaise/fatigue.  HENT: Negative for congestion and hearing loss.   Eyes: Negative for blurred vision.  Respiratory: Negative for cough and shortness of breath.   Cardiovascular: Negative for chest pain, palpitations and leg swelling.  Gastrointestinal: Negative for abdominal pain, blood in stool, constipation, heartburn and melena.  Genitourinary: Positive for urgency. Negative for dysuria, flank pain, frequency and hematuria.  Musculoskeletal: Negative for falls and myalgias.  Skin: Negative for itching and rash.  Neurological: Negative for dizziness, loss of consciousness and weakness.  Endo/Heme/Allergies: Does not bruise/bleed easily.  Psychiatric/Behavioral: Positive for depression and memory loss. The patient is not nervous/anxious and does not have insomnia.     Health Maintenance  Topic Date Due  . OPHTHALMOLOGY EXAM  04/14/2014  . URINE MICROALBUMIN  03/07/2015  . TETANUS/TDAP  04/14/2020 (Originally 12/16/1949)  . INFLUENZA VACCINE  11/12/2016  . FOOT EXAM  11/29/2016  . HEMOGLOBIN A1C  12/17/2016  . PNA vac Low  Risk Adult  Completed    Physical Exam: Vitals:   09/18/16 0756  BP: 136/70  Pulse: 72  Temp: 98.3 F (36.8 C)  TempSrc: Oral  SpO2: 99%  Weight: 163 lb (73.9 kg)   Body mass index is 25.53 kg/m. Physical Exam  Constitutional: He is oriented to person, place, and time. He appears well-developed and well-nourished. No distress.  HENT:  Head: Normocephalic and atraumatic.  Cardiovascular: Normal rate, regular rhythm and intact distal pulses.   Murmur heard. Pulmonary/Chest: Effort normal and breath sounds normal. No respiratory distress.  Abdominal: Soft. Bowel sounds are normal. He exhibits no distension. There is no tenderness.  Musculoskeletal: Normal range of motion.  Neurological: He is alert and oriented to person, place, and time.  Some short term memory loss  Skin: Skin is warm and dry.  Psychiatric: He has a normal mood and affect.    Labs reviewed: Basic Metabolic Panel:  Recent Labs  01/14/16 1226 06/06/16 1707 06/16/16 1430  NA 141 138 137  K 4.7 4.0 5.0  CL 107 106 103  CO2 26 20* 28  GLUCOSE 86 75 74  BUN 21 17 17   CREATININE 1.27* 1.45* 1.31*  CALCIUM 10.1 9.4 9.2   Liver Function Tests:  Recent Labs  01/14/16 1226  AST 32  ALT 28  ALKPHOS 72  BILITOT 0.3  PROT 7.2  ALBUMIN 4.0   No results for input(s): LIPASE, AMYLASE in the last 8760 hours. No results for input(s): AMMONIA in the last 8760 hours. CBC:  Recent Labs  01/14/16 1226 06/06/16 1707 06/08/16 1714  WBC 6.6 10.4 10.7*  NEUTROABS 4,554 9.0* 8.4*  HGB 12.2* 12.7* 12.1*  HCT 36.8* 38.3* 35.8*  MCV 91.3 91.6 90.4  PLT 413* 322 345   Lipid Panel: No results for input(s): CHOL, HDL, LDLCALC, TRIG, CHOLHDL, LDLDIRECT in the last 8760 hours. Lab Results  Component Value Date   HGBA1C 5.4 06/16/2016    Assessment/Plan 1. Cervical paraspinal muscle spasm -counseled to use heat on it for 20 mins a few times a day on moderate setting, may use topicals but not at the same  time as heat, using hydrocodone at hs which he says does help -not a good nsaid candidate -if not improving or getting worse, would refer for PT for tens, ultrasound, biofreeze, etc. measures  2. Essential hypertension, benign - bp improved this visit so cont same regimen - COMPLETE METABOLIC PANEL WITH GFR  3. Type 2 diabetes mellitus with stage 2 chronic kidney disease, without long-term current use of insulin (HCC) - well controlled to normal level average - f/u labs - Microalbumin / creatinine urine ratio--cannot take ace/arb due to allergy - Hemoglobin A1c  4. Atherosclerosis of native coronary artery of native heart without angina pectoris - no symptoms, cont secondary prevention - Lipid panel  5. Mixed hyperlipidemia due to type 2 diabetes mellitus (HCC) -cont lipitor and check labs this am - Lipid panel  6. Chronic kidney disease, stage III (moderate) - Avoid nephrotoxic agents like nsaids, dose adjust renally excreted meds, hydrate. - COMPLETE METABOLIC PANEL WITH GFR - CBC with Differential/Platelet  7. MGUS (monoclonal gammopathy of unknown significance) - needs annual lab f/u on this to ensure no progression to myeloma - CBC with Differential/Platelet - Protein electrophoresis, serum - Protein Electrophoresis,Random Urn  Labs/tests ordered:   Orders Placed This Encounter  Procedures  . Lipid panel  . Microalbumin / creatinine urine ratio  . Hemoglobin A1c  . COMPLETE METABOLIC PANEL WITH GFR  . CBC with Differential/Platelet  . Protein electrophoresis, serum  . Protein Electrophoresis,Random Urn   Next appt:  3 mos med mgt  Beatrice Sehgal L. Sundee Garland, D.O. [facility] [address], [place] [address] Cell Phone (Mon-Fri 8am-5pm):  [phone] On Call:  [phone] & follow prompts after 5pm & weekends Office Phone:  [phone] Office Fax:  [phone]

## 2016-09-19 LAB — MICROALBUMIN / CREATININE URINE RATIO
Creatinine, Urine: 143 mg/dL (ref 20–370)
Microalb Creat Ratio: 70 mcg/mg creat — ABNORMAL HIGH (ref <30)
Microalb, Ur: 10 mg/dL

## 2016-09-19 LAB — HEMOGLOBIN A1C
Hgb A1c MFr Bld: 5.3 % (ref <5.7)
Mean Plasma Glucose: 105 mg/dL

## 2016-09-23 ENCOUNTER — Other Ambulatory Visit: Payer: Self-pay | Admitting: Internal Medicine

## 2016-09-23 DIAGNOSIS — E118 Type 2 diabetes mellitus with unspecified complications: Secondary | ICD-10-CM

## 2016-09-23 LAB — PROTEIN ELECTROPHORESIS, SERUM
Abnormal Protein Band1: 0.3 g/dL
Albumin ELP: 3.9 g/dL (ref 3.8–4.8)
Alpha-1-Globulin: 0.4 g/dL — ABNORMAL HIGH (ref 0.2–0.3)
Alpha-2-Globulin: 0.8 g/dL (ref 0.5–0.9)
Beta 2: 0.5 g/dL (ref 0.2–0.5)
Beta Globulin: 0.4 g/dL (ref 0.4–0.6)
Gamma Globulin: 1.6 g/dL (ref 0.8–1.7)
Total Protein, Serum Electrophoresis: 7.6 g/dL (ref 6.1–8.1)

## 2016-09-24 LAB — PROTEIN ELECTROPHORESIS,RANDOM URN
Albumin: 49.7 %
Alpha-1-Globulin, U: 21.1 %
Alpha-2-Globulin, U: 19.8 %
Beta Globulin, U: 6.2 %
Creatinine, Urine: 143 mg/dL (ref 20–370)
Gamma Globulin, U: 3.2 %
Protein Creatinine Ratio: 161 mg/g creat — ABNORMAL HIGH (ref 22–128)
Total Protein, Urine: 23 mg/dL (ref 5–25)

## 2016-09-24 NOTE — Telephone Encounter (Signed)
Form placed in Dr. Cyndi Lennert folder to review and sign.

## 2016-09-24 NOTE — Telephone Encounter (Signed)
Pt stated at his clinic appointment that he does want the paperwork filled in so he can get men's incontinence briefs. I don't know why he didn't call back.  He is now living with his brother who has dementia.

## 2016-09-30 DIAGNOSIS — N3943 Post-void dribbling: Secondary | ICD-10-CM | POA: Diagnosis not present

## 2016-09-30 DIAGNOSIS — R32 Unspecified urinary incontinence: Secondary | ICD-10-CM | POA: Diagnosis not present

## 2017-01-08 ENCOUNTER — Encounter: Payer: Self-pay | Admitting: Internal Medicine

## 2017-01-08 ENCOUNTER — Ambulatory Visit (INDEPENDENT_AMBULATORY_CARE_PROVIDER_SITE_OTHER): Payer: Medicare HMO | Admitting: Internal Medicine

## 2017-01-08 VITALS — BP 140/70 | HR 72 | Temp 98.0°F | Wt 150.0 lb

## 2017-01-08 DIAGNOSIS — E118 Type 2 diabetes mellitus with unspecified complications: Secondary | ICD-10-CM | POA: Diagnosis not present

## 2017-01-08 DIAGNOSIS — Z23 Encounter for immunization: Secondary | ICD-10-CM | POA: Diagnosis not present

## 2017-01-08 DIAGNOSIS — I2510 Atherosclerotic heart disease of native coronary artery without angina pectoris: Secondary | ICD-10-CM | POA: Diagnosis not present

## 2017-01-08 DIAGNOSIS — E1169 Type 2 diabetes mellitus with other specified complication: Secondary | ICD-10-CM

## 2017-01-08 DIAGNOSIS — D472 Monoclonal gammopathy: Secondary | ICD-10-CM

## 2017-01-08 DIAGNOSIS — E782 Mixed hyperlipidemia: Secondary | ICD-10-CM | POA: Diagnosis not present

## 2017-01-08 DIAGNOSIS — R634 Abnormal weight loss: Secondary | ICD-10-CM

## 2017-01-08 DIAGNOSIS — I10 Essential (primary) hypertension: Secondary | ICD-10-CM

## 2017-01-08 MED ORDER — AMLODIPINE BESYLATE 2.5 MG PO TABS: 5.0000 mg | ORAL_TABLET | Freq: Every day | ORAL | 0 refills | Status: DC

## 2017-01-08 MED ORDER — METFORMIN HCL 500 MG PO TABS: 500.0000 mg | ORAL_TABLET | Freq: Every day | ORAL | 0 refills | Status: DC

## 2017-01-08 MED ORDER — METOPROLOL TARTRATE 50 MG PO TABS: 50.0000 mg | ORAL_TABLET | Freq: Two times a day (BID) | ORAL | 0 refills | Status: DC

## 2017-01-08 MED ORDER — ALLOPURINOL 100 MG PO TABS: 100.0000 mg | ORAL_TABLET | Freq: Every day | ORAL | 0 refills | Status: DC

## 2017-01-08 MED ORDER — CITALOPRAM HYDROBROMIDE 20 MG PO TABS: 20.0000 mg | ORAL_TABLET | Freq: Every day | ORAL | 0 refills | Status: DC

## 2017-01-08 MED ORDER — ATORVASTATIN CALCIUM 80 MG PO TABS: 80.0000 mg | ORAL_TABLET | Freq: Every day | ORAL | 0 refills | Status: DC

## 2017-01-08 NOTE — Addendum Note (Signed)
Addended by: Denyse Amass on: 01/08/2017 02:22 PM   Modules accepted: Orders

## 2017-01-08 NOTE — Progress Notes (Signed)
Location:  [facility] clinic Provider:  Zyaira Vejar L. Mariea Clonts, D.O., C.M.D.  Code Status: full code Goals of Care:  Advanced Directives 09/18/2016  Does Patient Have a Medical Advance Directive? No  Type of Advance Directive -  Does patient want to make changes to medical advance directive? -  Copy of [place] in Chart? -  Would patient like information on creating a medical advance directive? No - Patient declined   Chief Complaint  Patient presents with  . Medical Management of Chronic Issues    5mth follow-up    HPI: Patient is a 83 y.o. male seen today for medical management of chronic diseases.   He's taking care of his brother.  His brother get up in the middle of the night and set off an alarm.  Has to watch him to make sure he uses his walker and does not fall.  He's trying to move next month--going to go back to [place] where he was before he got married.    He's lost 13 lbs since 6/7.   Yesterday, had sausage and eggs with toast, chicken sandwiches (2 a piece).   Today, had scrambled eggs, sausage and grits and coffee. He does like the boost shakes. He quit smoking after his wife died.  HTN:  bp ok.   DMII with CKD3:  hba1c as 5.3.  He's on metformin, but has lost so much weight that seems unnecessary CADs/p stent:  No chest pain or sob.   Hyperlipidemia:  Last LDL had improved to 127--is on lipitor 40mg  daily.  Does not eat the best diet Gout:  No gout flares on allopurinol. MGUS:  Just had spep/upep and ran by hematology and ok.  Past Medical History:  Diagnosis Date  . CAD (coronary artery disease)    PTCA of OM in 1998, BMS OM in 2000, Cath 9/07 LM ok LAD ok. LCX 95% in OM prior to previous stent RCA. nondominant normal EF normal. Cypher DES to OM 2007 (PLACED PROXIAMAL TO PREVIOUS STENT)  . Chronic kidney disease, stage III (moderate)   . Chronic kidney disease, unspecified   . Coronary atherosclerosis of unspecified type of vessel, native or graft     . Depression   . DM type 2 (diabetes mellitus, type 2) ([place])   . DM type 2 causing CKD stage 2 ([place]) 08/01/2011  . Gout, unspecified   . HLD (hyperlipidemia)   . HTN (hypertension)   . Loss of weight   . Memory loss   . MGUS (monoclonal gammopathy of unknown significance) 08/01/2011   IgG kappa dx 2002 8% plasma cells in bone marrow; no lesions on bone X-rays;  [name] Monoclonal paraproteinemia   . Osteoarthrosis, unspecified whether generalized or localized, unspecified site   . Other atopic dermatitis and related conditions   . Peripheral vascular disease, unspecified ([place])   . Personality change due to conditions classified elsewhere   . PVD (peripheral vascular disease) ([place])   . Renal insufficiency   . S/P coronary artery stent placement 08/01/2011  . Spasm of muscle   . Thyroid disease   . Unspecified disorder of kidney and ureter   . Unspecified hearing loss   . Urinary tract infection, site not specified     Past Surgical History:  Procedure Laterality Date  . CAROTID STENT    . CORONARY ANGIOPLASTY    . TOTAL KNEE ARTHROPLASTY Left     Allergies  Allergen Reactions  . Ace Inhibitors Swelling  angioedema  . Losartan Potassium Swelling    Angioedema   . Crestor [Rosuvastatin] Swelling    Outpatient Encounter Prescriptions as of 01/08/2017  Medication Sig  . allopurinol (ZYLOPRIM) 100 MG tablet Take 1 tablet (100 mg total) by mouth daily.  [name] amLODipine (NORVASC) 2.5 MG tablet Take 2 tablets (5 mg total) by mouth daily.  [name] aspirin 81 MG tablet Take 1 tablet (81 mg total) by mouth daily.  [name] atorvastatin (LIPITOR) 80 MG tablet Take 1 tablet (80 mg total) by mouth daily. For High Cholesterol  . citalopram (CELEXA) 20 MG tablet TAKE 1 TABLET(20 MG) BY MOUTH DAILY  . HYDROcodone-acetaminophen (NORCO) 5-325 MG tablet Take 2 tablets by mouth every 4 (four) hours as needed.  . metFORMIN (GLUCOPHAGE) 500 MG tablet TAKE 1 TABLET BY MOUTH EVERY DAY WITH BREAKFAST FOR BLOOD  SUGAR  . metoprolol (LOPRESSOR) 50 MG tablet Take one tablet by mouth twice daily for blood pressure  . metoprolol tartrate (LOPRESSOR) 50 MG tablet TAKE 1 TABLET BY MOUTH TWICE DAILY FOR BLOOD PRESSURE  . nitroGLYCERIN (NITROSTAT) 0.4 MG SL tablet Place 0.4 mg under the tongue every 5 (five) minutes as needed for chest pain. Up to 3 doses  . ONE TOUCH ULTRA TEST test strip Use to test blood sugar once daily  . ULTRA-THIN II MINI PEN NEEDLE 31G X 5 MM MISC   . [DISCONTINUED] citalopram (CELEXA) 20 MG tablet Take 1 tablet (20 mg total) by mouth daily.  . [DISCONTINUED] metFORMIN (GLUCOPHAGE) 500 MG tablet Take one tablet by mouth once daily with breakfast to control blood sugar   Facility-Administered Encounter Medications as of 01/08/2017  Medication  . 0.9 %  sodium chloride infusion    Review of Systems:  Review of Systems  Constitutional: Positive for weight loss. Negative for chills, fever and malaise/fatigue.  HENT: Negative for congestion and hearing loss.   Eyes: Negative for blurred vision.  Respiratory: Negative for cough and shortness of breath.   Cardiovascular: Negative for chest pain and palpitations.  Gastrointestinal: Negative for abdominal pain, blood in stool, constipation and melena.  Genitourinary: Negative for dysuria.  Musculoskeletal: Positive for joint pain. Negative for falls.  Neurological: Negative for dizziness and weakness.  Endo/Heme/Allergies: Does not bruise/bleed easily.  Psychiatric/Behavioral: Negative for memory loss.    Health Maintenance  Topic Date Due  . OPHTHALMOLOGY EXAM  04/14/2014  . INFLUENZA VACCINE  11/12/2016  . FOOT EXAM  11/29/2016  . TETANUS/TDAP  04/14/2020 (Originally 12/16/1949)  . HEMOGLOBIN A1C  03/20/2017  . URINE MICROALBUMIN  09/18/2017  . PNA vac Low Risk Adult  Completed    Physical Exam: Vitals:   01/08/17 1328  BP: 140/70  Pulse: 72  Temp: 98 F (36.7 C)  TempSrc: Oral  SpO2: 98%  Weight: 150 lb (68 kg)    Body mass index is 23.49 kg/m. Physical Exam  Constitutional: He is oriented to person, place, and time. No distress.  Increasingly frail  HENT:  Head: Normocephalic and atraumatic.  Cardiovascular: Normal rate, regular rhythm, normal heart sounds and intact distal pulses.   Pulmonary/Chest: Effort normal and breath sounds normal. No respiratory distress.  Abdominal: Bowel sounds are normal.  Musculoskeletal: Normal range of motion.  Neurological: He is alert and oriented to person, place, and time.  Skin: Skin is warm and dry. Capillary refill takes less than 2 seconds.  Psychiatric: He has a normal mood and affect.    Labs reviewed: Basic Metabolic Panel:  Recent Labs  06/06/16 1707  06/16/16 1430 09/18/16 0848  NA 138 137 140  K 4.0 5.0 4.4  CL 106 103 108  CO2 20* 28 23  GLUCOSE 75 74 79  BUN 17 17 22   CREATININE 1.45* 1.31* 1.49*  CALCIUM 9.4 9.2 9.3   Liver Function Tests:  Recent Labs  01/14/16 1226 09/18/16 0848  AST 32 21  ALT 28 20  ALKPHOS 72 79  BILITOT 0.3 0.3  PROT 7.2 7.6  ALBUMIN 4.0 3.9   No results for input(s): LIPASE, AMYLASE in the last 8760 hours. No results for input(s): AMMONIA in the last 8760 hours. CBC:  Recent Labs  06/06/16 1707 06/08/16 1714 09/18/16 0848  WBC 10.4 10.7* 6.3  NEUTROABS 9.0* 8.4* 4,221  HGB 12.7* 12.1* 11.9*  HCT 38.3* 35.8* 36.0*  MCV 91.6 90.4 92.8  PLT 322 345 532*   Lipid Panel:  Recent Labs  09/18/16 0848  CHOL 196  HDL 48  LDLCALC 127*  TRIG 107  CHOLHDL 4.1   Lab Results  Component Value Date   HGBA1C 5.3 09/18/2016    Assessment/Plan 1. Essential hypertension -cont bp medication as ordered, refilled - amLODipine (NORVASC) 2.5 MG tablet; Take 2 tablets (5 mg total) by mouth daily.  Dispense: 180 tablet; Refill: 0 - COMPLETE METABOLIC PANEL WITH GFR; Future  2. Type 2 diabetes mellitus with complication, without long-term current use of insulin (HCC) -doing wonderfully, but  intake poor and losing weight - COMPLETE METABOLIC PANEL WITH GFR; Future  3. MGUS (monoclonal gammopathy of unknown significance) - SPEP/UPEP negative last labs - CBC with Differential/Platelet; Future - COMPLETE METABOLIC PANEL WITH GFR; Future  4. Mixed hyperlipidemia due to type 2 diabetes mellitus (HCC) - cont lipitor therapy - Lipid panel; Future  5. Atherosclerosis of native coronary artery of native heart without angina pectoris - no symptoms, prior stent -cont med mgt - Lipid panel; Future  6. Loss of weight -restart daily boost 1-2 shakes per day and increase food intake  Labs/tests ordered:   Orders Placed This Encounter  Procedures  . CBC with Differential/Platelet    Standing Status:   Future    Standing Expiration Date:   09/07/2017  . COMPLETE METABOLIC PANEL WITH GFR    Standing Status:   Future    Standing Expiration Date:   09/07/2017  . Lipid panel    Standing Status:   Future    Standing Expiration Date:   09/07/2017    Next appt:  05/25/2017 for AWV, also appt with me in 4 mos for med mgt   Sakari Alkhatib L. Anaiyah Anglemyer, D.O. [facility] [address], [place] [address] Cell Phone (Mon-Fri 8am-5pm):  [phone] On Call:  [phone] & follow prompts after 5pm & weekends Office Phone:  [phone] Office Fax:  [phone]

## 2017-01-14 DIAGNOSIS — R809 Proteinuria, unspecified: Secondary | ICD-10-CM | POA: Diagnosis not present

## 2017-01-14 DIAGNOSIS — T783XXA Angioneurotic edema, initial encounter: Secondary | ICD-10-CM | POA: Diagnosis not present

## 2017-01-14 DIAGNOSIS — N183 Chronic kidney disease, stage 3 (moderate): Secondary | ICD-10-CM | POA: Diagnosis not present

## 2017-01-14 DIAGNOSIS — D472 Monoclonal gammopathy: Secondary | ICD-10-CM | POA: Diagnosis not present

## 2017-01-14 DIAGNOSIS — I150 Renovascular hypertension: Secondary | ICD-10-CM | POA: Diagnosis not present

## 2017-01-14 DIAGNOSIS — E1121 Type 2 diabetes mellitus with diabetic nephropathy: Secondary | ICD-10-CM | POA: Diagnosis not present

## 2017-01-14 DIAGNOSIS — D509 Iron deficiency anemia, unspecified: Secondary | ICD-10-CM | POA: Diagnosis not present

## 2017-03-25 ENCOUNTER — Other Ambulatory Visit: Payer: Self-pay | Admitting: *Deleted

## 2017-03-25 DIAGNOSIS — I10 Essential (primary) hypertension: Secondary | ICD-10-CM

## 2017-03-25 MED ORDER — GLUCOSE BLOOD VI STRP: ORAL_STRIP | 3 refills | Status: DC

## 2017-03-25 MED ORDER — CITALOPRAM HYDROBROMIDE 20 MG PO TABS: 20.0000 mg | ORAL_TABLET | Freq: Every day | ORAL | 1 refills | Status: DC

## 2017-03-25 MED ORDER — ALLOPURINOL 100 MG PO TABS: 100.0000 mg | ORAL_TABLET | Freq: Every day | ORAL | 1 refills | Status: DC

## 2017-03-25 MED ORDER — ACCU-CHEK AVIVA PLUS W/DEVICE KIT: PACK | 0 refills | Status: DC

## 2017-03-25 MED ORDER — ACCU-CHEK SOFTCLIX LANCETS MISC: 3 refills | Status: DC

## 2017-03-25 MED ORDER — ATORVASTATIN CALCIUM 80 MG PO TABS: 80.0000 mg | ORAL_TABLET | Freq: Every day | ORAL | 1 refills | Status: DC

## 2017-03-25 MED ORDER — AMLODIPINE BESYLATE 2.5 MG PO TABS: 5.0000 mg | ORAL_TABLET | Freq: Every day | ORAL | 1 refills | Status: DC

## 2017-03-25 MED ORDER — BD SWAB SINGLE USE REGULAR PADS
MEDICATED_PAD | 3 refills | Status: DC
Start: 2017-03-25 — End: 2022-05-15

## 2017-03-25 NOTE — Telephone Encounter (Signed)
[facility] 

## 2017-04-02 ENCOUNTER — Other Ambulatory Visit: Payer: Self-pay | Admitting: *Deleted

## 2017-04-02 MED ORDER — CITALOPRAM HYDROBROMIDE 20 MG PO TABS: 20.0000 mg | ORAL_TABLET | Freq: Every day | ORAL | 1 refills | Status: DC

## 2017-04-02 MED ORDER — ATORVASTATIN CALCIUM 80 MG PO TABS: 80.0000 mg | ORAL_TABLET | Freq: Every day | ORAL | 1 refills | Status: DC

## 2017-04-02 NOTE — Telephone Encounter (Signed)
[facility] 

## 2017-04-03 DIAGNOSIS — R32 Unspecified urinary incontinence: Secondary | ICD-10-CM | POA: Diagnosis not present

## 2017-04-03 DIAGNOSIS — N3943 Post-void dribbling: Secondary | ICD-10-CM | POA: Diagnosis not present

## 2017-05-07 ENCOUNTER — Other Ambulatory Visit: Payer: Medicare HMO

## 2017-05-07 DIAGNOSIS — I2510 Atherosclerotic heart disease of native coronary artery without angina pectoris: Secondary | ICD-10-CM

## 2017-05-07 DIAGNOSIS — D472 Monoclonal gammopathy: Secondary | ICD-10-CM | POA: Diagnosis not present

## 2017-05-07 DIAGNOSIS — I10 Essential (primary) hypertension: Secondary | ICD-10-CM | POA: Diagnosis not present

## 2017-05-07 DIAGNOSIS — E1169 Type 2 diabetes mellitus with other specified complication: Secondary | ICD-10-CM

## 2017-05-07 DIAGNOSIS — E118 Type 2 diabetes mellitus with unspecified complications: Secondary | ICD-10-CM | POA: Diagnosis not present

## 2017-05-07 DIAGNOSIS — E782 Mixed hyperlipidemia: Secondary | ICD-10-CM | POA: Diagnosis not present

## 2017-05-07 LAB — LIPID PANEL
Cholesterol: 183 mg/dL (ref <200)
HDL: 63 mg/dL (ref >40)
LDL Cholesterol (Calc): 101 mg/dL (calc) — ABNORMAL HIGH
Non-HDL Cholesterol (Calc): 120 mg/dL (calc) (ref <130)
Total CHOL/HDL Ratio: 2.9 (calc) (ref <5.0)
Triglycerides: 95 mg/dL (ref <150)

## 2017-05-07 LAB — CBC WITH DIFFERENTIAL/PLATELET
Basophils Absolute: 29 cells/uL (ref 0–200)
Basophils Relative: 0.5 %
Eosinophils Absolute: 168 cells/uL (ref 15–500)
Eosinophils Relative: 2.9 %
HCT: 37.5 % — ABNORMAL LOW (ref 38.5–50.0)
Hemoglobin: 12.8 g/dL — ABNORMAL LOW (ref 13.2–17.1)
Lymphs Abs: 1293 cells/uL (ref 850–3900)
MCH: 30.5 pg (ref 27.0–33.0)
MCHC: 34.1 g/dL (ref 32.0–36.0)
MCV: 89.5 fL (ref 80.0–100.0)
MPV: 10.9 fL (ref 7.5–12.5)
Monocytes Relative: 6.7 %
Neutro Abs: 3921 cells/uL (ref 1500–7800)
Neutrophils Relative %: 67.6 %
Platelets: 370 10*3/uL (ref 140–400)
RBC: 4.19 10*6/uL — ABNORMAL LOW (ref 4.20–5.80)
RDW: 12.8 % (ref 11.0–15.0)
Total Lymphocyte: 22.3 %
WBC mixed population: 389 cells/uL (ref 200–950)
WBC: 5.8 10*3/uL (ref 3.8–10.8)

## 2017-05-07 LAB — COMPLETE METABOLIC PANEL WITH GFR
AG Ratio: 1.3 (calc) (ref 1.0–2.5)
ALT: 21 U/L (ref 9–46)
AST: 23 U/L (ref 10–35)
Albumin: 4.3 g/dL (ref 3.6–5.1)
Alkaline phosphatase (APISO): 70 U/L (ref 40–115)
BUN/Creatinine Ratio: 12 (calc) (ref 6–22)
BUN: 20 mg/dL (ref 7–25)
CO2: 24 mmol/L (ref 20–32)
Calcium: 9.6 mg/dL (ref 8.6–10.3)
Chloride: 107 mmol/L (ref 98–110)
Creat: 1.72 mg/dL — ABNORMAL HIGH (ref 0.70–1.11)
GFR, Est African American: 41 mL/min/{1.73_m2} — ABNORMAL LOW (ref >=60)
GFR, Est Non African American: 36 mL/min/{1.73_m2} — ABNORMAL LOW (ref >=60)
Globulin: 3.4 g/dL (calc) (ref 1.9–3.7)
Glucose, Bld: 118 mg/dL — ABNORMAL HIGH (ref 65–99)
Potassium: 4.2 mmol/L (ref 3.5–5.3)
Sodium: 139 mmol/L (ref 135–146)
Total Bilirubin: 0.4 mg/dL (ref 0.2–1.2)
Total Protein: 7.7 g/dL (ref 6.1–8.1)

## 2017-05-11 ENCOUNTER — Encounter: Payer: Self-pay | Admitting: Internal Medicine

## 2017-05-11 ENCOUNTER — Ambulatory Visit: Payer: Medicare HMO | Admitting: Internal Medicine

## 2017-05-11 VITALS — BP 140/80 | HR 57 | Temp 98.2°F | Wt 160.0 lb

## 2017-05-11 DIAGNOSIS — R634 Abnormal weight loss: Secondary | ICD-10-CM

## 2017-05-11 DIAGNOSIS — I10 Essential (primary) hypertension: Secondary | ICD-10-CM

## 2017-05-11 DIAGNOSIS — N183 Chronic kidney disease, stage 3 unspecified: Secondary | ICD-10-CM

## 2017-05-11 DIAGNOSIS — E782 Mixed hyperlipidemia: Secondary | ICD-10-CM

## 2017-05-11 DIAGNOSIS — E1169 Type 2 diabetes mellitus with other specified complication: Secondary | ICD-10-CM

## 2017-05-11 DIAGNOSIS — Z636 Dependent relative needing care at home: Secondary | ICD-10-CM

## 2017-05-11 DIAGNOSIS — E1122 Type 2 diabetes mellitus with diabetic chronic kidney disease: Secondary | ICD-10-CM | POA: Diagnosis not present

## 2017-05-11 NOTE — Progress Notes (Signed)
Provider:  Rexene Edison. Mariea Clonts, D.O., C.M.D. Location:      Place of Service:     Previous PCP: Gayland Curry, DO Patient Care Team: Gayland Curry, DO as PCP - General (Geriatric Medicine) Bensimhon, Shaune Pascal, MD as Consulting Physician (Cardiology)  Extended Emergency Contact Information Primary Emergency Contact: Darrol Angel [place] Mobile Phone: [phone] Relation: Sister  Code Status: Full Code Goals of Care: Advanced Directive information Advanced Directives 09/18/2016  Does Patient Have a Medical Advance Directive? No  Type of Advance Directive -  Does patient want to make changes to medical advance directive? -  Copy of [place] in Chart? -  Would patient like information on creating a medical advance directive? No - Patient declined    Chief Complaint  Patient presents with  . Medical Management of Chronic Issues    4th follow-up    HPI: Patient is a 84 y.o. male seen today for an annual physical exam. Comes in today well-groomed and pleasant in conversation.  He lives with brother and is the sole caregiver for him. But brother is now having increase in falls. Has broken ribs about 3 months back. Will return to [facility] in the next month as the stress of living with brother is to great.  He attends church regularly on Sunday and Wednesday. And enjoys volunteering.  DM: He is eating well. Fasting BS are an average in the 90's.  Diet Recall  Breakfast: Egg, grits, sausage, coffee, water Lunch: Soup and sandwich or toss salad Dinner: A meat, a starch,and one vegetable.    Overall he states he feels good, but he is not sleep well due to brother roaming at night. He is eating better, but needs to maintain hydration. He reports no trouble with ambulation and denies falls. He denies incontinence or leakage. He reports no skin breakdown or non healing wounds. He also denies changes in memory.   Past Medical History:  Diagnosis  Date  . CAD (coronary artery disease)    PTCA of OM in 1998, BMS OM in 2000, Cath 9/07 LM ok LAD ok. LCX 95% in OM prior to previous stent RCA. nondominant normal EF normal. Cypher DES to OM 2007 (PLACED PROXIAMAL TO PREVIOUS STENT)  . Chronic kidney disease, stage III (moderate) (HCC)   . Chronic kidney disease, unspecified   . Coronary atherosclerosis of unspecified type of vessel, native or graft   . Depression   . DM type 2 (diabetes mellitus, type 2) ([place])   . DM type 2 causing CKD stage 2 ([place]) 08/01/2011  . Gout, unspecified   . HLD (hyperlipidemia)   . HTN (hypertension)   . Loss of weight   . Memory loss   . MGUS (monoclonal gammopathy of unknown significance) 08/01/2011   IgG kappa dx 2002 8% plasma cells in bone marrow; no lesions on bone X-rays;  [name] Monoclonal paraproteinemia   . Osteoarthrosis, unspecified whether generalized or localized, unspecified site   . Other atopic dermatitis and related conditions   . Peripheral vascular disease, unspecified ([place])   . Personality change due to conditions classified elsewhere   . PVD (peripheral vascular disease) ([place])   . Renal insufficiency   . S/P coronary artery stent placement 08/01/2011  . Spasm of muscle   . Thyroid disease   . Unspecified disorder of kidney and ureter   . Unspecified hearing loss   . Urinary tract infection, site not specified    Past Surgical  History:  Procedure Laterality Date  . CAROTID STENT    . CORONARY ANGIOPLASTY    . TOTAL KNEE ARTHROPLASTY Left     reports that he quit smoking about 15 months ago. His smoking use included cigarettes. he has never used smokeless tobacco. He reports that he does not drink alcohol or use drugs.  Functional Status Survey:    Family History  Problem Relation Age of Onset  . Heart disease Mother   . Heart disease Sister   . Heart disease Sister   . Cancer Sister        type unknown  . Hypertension Sister   . Diabetes Brother   . Diabetes Brother   .  Diabetes Unknown   . Coronary artery disease Unknown   . Cancer Unknown     Health Maintenance  Topic Date Due  . OPHTHALMOLOGY EXAM  04/14/2014  . FOOT EXAM  11/29/2016  . HEMOGLOBIN A1C  03/20/2017  . TETANUS/TDAP  04/14/2020 (Originally 12/16/1949)  . URINE MICROALBUMIN  09/18/2017  . INFLUENZA VACCINE  Completed  . PNA vac Low Risk Adult  Completed    Allergies  Allergen Reactions  . Ace Inhibitors Swelling    angioedema  . Losartan Potassium Swelling    Angioedema   . Crestor [Rosuvastatin] Swelling    Outpatient Encounter Medications as of 05/11/2017  Medication Sig  . ACCU-CHEK SOFTCLIX LANCETS lancets Use to test blood sugar daily. Dx:E11.8  . Alcohol Swabs (B-D SINGLE USE SWABS REGULAR) PADS Use with testing of blood sugar. Dx:E11.8  . allopurinol (ZYLOPRIM) 100 MG tablet Take 1 tablet (100 mg total) by mouth daily.  [name] amLODipine (NORVASC) 2.5 MG tablet Take 2 tablets (5 mg total) by mouth daily.  [name] aspirin 81 MG tablet Take 1 tablet (81 mg total) by mouth daily.  [name] atorvastatin (LIPITOR) 80 MG tablet Take 1 tablet (80 mg total) by mouth daily. For High Cholesterol  . Blood Glucose Monitoring Suppl (ACCU-CHEK AVIVA PLUS) w/Device KIT Use to check blood sugar daily. Dx:E11.8  . citalopram (CELEXA) 20 MG tablet Take 1 tablet (20 mg total) by mouth daily.  [name] glucose blood (ACCU-CHEK AVIVA PLUS) test strip Use to check blood sugar daily. Dx:E11.8  . metoprolol tartrate (LOPRESSOR) 50 MG tablet Take 1 tablet (50 mg total) by mouth 2 (two) times daily.  . nitroGLYCERIN (NITROSTAT) 0.4 MG SL tablet Place 0.4 mg under the tongue every 5 (five) minutes as needed for chest pain. Up to 3 doses  . ULTRA-THIN II MINI PEN NEEDLE 31G X 5 MM MISC    Facility-Administered Encounter Medications as of 05/11/2017  Medication  . 0.9 %  sodium chloride infusion    Review of Systems  Constitutional: Negative.   HENT: Negative.   Eyes:       Wears glasses  Respiratory: Negative.     Cardiovascular: Negative for chest pain, palpitations and leg swelling.       Goes to cardiologist Feb  Gastrointestinal: Negative for blood in stool and heartburn.  Genitourinary: Negative for hematuria.  Musculoskeletal: Positive for joint pain.       Left knee pain-constant especially with weather Does not take pain medicine  Skin: Negative.   Neurological: Negative.   Psychiatric/Behavioral: Negative for depression and memory loss. The patient has insomnia. The patient is not nervous/anxious.        Unable to sleep well due to brother    Vitals:   05/11/17 0955  BP: 140/80  Pulse: (!) 57  Temp: 98.2 F (36.8 C)  TempSrc: Oral  SpO2: 98%  Weight: 160 lb (72.6 kg)   Body mass index is 25.06 kg/m. Physical Exam  Constitutional: He is oriented to person, place, and time. He appears well-developed and well-nourished.  HENT:  Head: Normocephalic.  Neck: Normal range of motion.  Cardiovascular: Normal rate, regular rhythm and intact distal pulses.  Murmur heard. Pulmonary/Chest: Effort normal and breath sounds normal.  Abdominal: Soft. Bowel sounds are normal.  Musculoskeletal: Normal range of motion.  Neurological: He is alert and oriented to person, place, and time.  Skin: Skin is warm and dry.  Psychiatric: He has a normal mood and affect. His behavior is normal. Judgment and thought content normal.    Labs reviewed: Basic Metabolic Panel: Recent Labs    06/16/16 1430 09/18/16 0848 05/07/17 0947  NA 137 140 139  K 5.0 4.4 4.2  CL 103 108 107  CO2 _0 GLUCOSE 74 79 118*  BUN _1 CREATININE 1.31* 1.49* 1.72*  CALCIUM 9.2 9.3 9.6   Liver Function Tests: Recent Labs    09/18/16 0848 05/07/17 0947  AST 21 23  ALT 20 21  ALKPHOS 79  --   BILITOT 0.3 0.4  PROT 7.6 7.7  ALBUMIN 3.9  --    No results for input(s): LIPASE, AMYLASE in the last 8760 hours. No results for input(s): AMMONIA in the last 8760 hours. CBC: Recent Labs     06/08/16 1714 09/18/16 0848 05/07/17 0947  WBC 10.7* 6.3 5.8  NEUTROABS 8.4* 4,221 3,921  HGB 12.1* 11.9* 12.8*  HCT 35.8* 36.0* 37.5*  MCV 90.4 92.8 89.5  PLT 345 532* 370   Cardiac Enzymes: No results for input(s): CKTOTAL, CKMB, CKMBINDEX, TROPONINI in the last 8760 hours. BNP: Invalid input(s): POCBNP Lab Results  Component Value Date   HGBA1C 5.3 09/18/2016   Lab Results  Component Value Date   TSH 3.240 08/25/2012   No results found for: VITAMINB12 No results found for: FOLATE Lab Results  Component Value Date   IRON 66 01/20/2011   TIBC 230 01/20/2011   FERRITIN 492 (H) 01/20/2011    Imaging and Procedures Recently:  Assessment/Plan  1. Mixed hyperlipidemia due to type 2 diabetes mellitus (HCC) LDL is slightly elevated for history of DM. Would like to see it closer to 70 or less. Will continue lipitor at this time.   2. Chronic kidney disease, stage III (moderate) (HCC) Labs from last week demonstrate a decrease in GFR and creatinine levels. Unsure if this is from a fasting state versus overall reduction in hydration status. Will assess in future. Reviewed medications at this time we will maintain his current regimen.    - Basic metabolic panel; Future  3. Controlled type 2 diabetes mellitus with stage 3 chronic kidney disease, without long-term current use of insulin (HCC) Last A1c was good. Will recheck prior to annual appointment to make sure he is still diet controlled. Advised to continue a well balanced diet and maintain hydration. Provided a case of Boost today in office.  - Hemoglobin A1c; Future  4. Essential hypertension, benign His blood pressure today in office was 140/80, DBP elevated from previous Sept visit. He is without complaint CP, racing HR, leg swelling or dizziness. He states he follows up with "Dr Linna Hoff" next month. Will maintain his Norvasc as he is tolerating it well. Will recheck labs prior to annual appt.  - CBC with  Differential/Platelet; Future - Basic metabolic  panel; Future  5. 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 He is looking to move back to [place] next month as he is unable to continue to care for his brother. There is no home health at this time. His nieces-his brother's daughters are supposed to step in when he moves out.    Labs/tests ordered:  Orders Placed This Encounter  Procedures  . CBC with Differential/Platelet    Standing Status:   Future    Standing Expiration Date:   01/09/2018  . Basic metabolic panel    Standing Status:   Future    Standing Expiration Date:   01/09/2018    Order Specific Question:   Has the patient fasted?    Answer:   Yes  . Hemoglobin A1c    Standing Status:   Future    Standing Expiration Date:   01/09/2018   Karen Kays, DNP [employer] Geriatrics [facility] [phone], [place] [address] Cell Phone (Mon-Fri 8am-5pm):  [phone] On Call:  [phone] & follow prompts after 5pm & weekends Office Phone:  [phone] Office Fax:  [phone]

## 2017-05-11 NOTE — Patient Instructions (Addendum)
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   Continue to eat well, provided Boost. Stay hydrated.

## 2017-05-25 ENCOUNTER — Ambulatory Visit (INDEPENDENT_AMBULATORY_CARE_PROVIDER_SITE_OTHER): Payer: Medicare HMO

## 2017-05-25 VITALS — BP 142/60 | HR 76 | Temp 98.1°F | Ht 67.0 in | Wt 164.0 lb

## 2017-05-25 DIAGNOSIS — Z0000 Encounter for general adult medical examination without abnormal findings: Secondary | ICD-10-CM

## 2017-05-25 NOTE — Patient Instructions (Signed)
Kevin Schwartz , Thank you for taking time to come for your Medicare Wellness Visit. I appreciate your ongoing commitment to your health goals. Please review the following plan we discussed and let me know if I can assist you in the future.   Screening recommendations/referrals: Colonoscopy excluded, you are over age 82 Recommended yearly ophthalmology/optometry visit for glaucoma screening and checkup Recommended yearly dental visit for hygiene and checkup  Vaccinations: Influenza vaccine up to date, due 2019 fall season Pneumococcal vaccine up to date Tdap vaccine up to date, due 04/14/2021 Shingles vaccine excluded, never had chicken pox    Advanced directives: Advance directive discussed with you today. I have provided a copy for you to complete at home and have notarized. Once this is complete please bring a copy in to our office so we can scan it into your chart.   Conditions/risks identified: none  Next appointment: Dr. Mariea Clonts 09/14/2017 9am  Preventive Care 48 Years and Older, Male Preventive care refers to lifestyle choices and visits with your health care provider that can promote health and wellness. What does preventive care include?  A yearly physical exam. This is also called an annual well check.  Dental exams once or twice a year.  Routine eye exams. Ask your health care provider how often you should have your eyes checked.  Personal lifestyle choices, including:  Daily care of your teeth and gums.  Regular physical activity.  Eating a healthy diet.  Avoiding tobacco and drug use.  Limiting alcohol use.  Practicing safe sex.  Taking low doses of aspirin every day.  Taking vitamin and mineral supplements as recommended by your health care provider. What happens during an annual well check? The services and screenings done by your health care provider during your annual well check will depend on your age, overall health, lifestyle risk factors, and family history  of disease. Counseling  Your health care provider may ask you questions about your:  Alcohol use.  Tobacco use.  Drug use.  Emotional well-being.  Home and relationship well-being.  Sexual activity.  Eating habits.  History of falls.  Memory and ability to understand (cognition).  Work and work Statistician. Screening  You may have the following tests or measurements:  Height, weight, and BMI.  Blood pressure.  Lipid and cholesterol levels. These may be checked every 5 years, or more frequently if you are over 55 years old.  Skin check.  Lung cancer screening. You may have this screening every year starting at age 31 if you have a 30-pack-year history of smoking and currently smoke or have quit within the past 15 years.  Fecal occult blood test (FOBT) of the stool. You may have this test every year starting at age 84.  Flexible sigmoidoscopy or colonoscopy. You may have a sigmoidoscopy every 5 years or a colonoscopy every 10 years starting at age 7.  Prostate cancer screening. Recommendations will vary depending on your family history and other risks.  Hepatitis C blood test.  Hepatitis B blood test.  Sexually transmitted disease (STD) testing.  Diabetes screening. This is done by checking your blood sugar (glucose) after you have not eaten for a while (fasting). You may have this done every 1-3 years.  Abdominal aortic aneurysm (AAA) screening. You may need this if you are a current or former smoker.  Osteoporosis. You may be screened starting at age 39 if you are at high risk. Talk with your health care provider about your test results, treatment options,  and if necessary, the need for more tests. Vaccines  Your health care provider may recommend certain vaccines, such as:  Influenza vaccine. This is recommended every year.  Tetanus, diphtheria, and acellular pertussis (Tdap, Td) vaccine. You may need a Td booster every 10 years.  Zoster vaccine. You may  need this after age 83.  Pneumococcal 13-valent conjugate (PCV13) vaccine. One dose is recommended after age 44.  Pneumococcal polysaccharide (PPSV23) vaccine. One dose is recommended after age 31. Talk to your health care provider about which screenings and vaccines you need and how often you need them. This information is not intended to replace advice given to you by your health care provider. Make sure you discuss any questions you have with your health care provider. Document Released: 04/27/2015 Document Revised: 12/19/2015 Document Reviewed: 01/30/2015 Elsevier Interactive Patient Education  2017 [place] Prevention in the Home Falls can cause injuries. They can happen to people of all ages. There are many things you can do to make your home safe and to help prevent falls. What can I do on the outside of my home?  Regularly fix the edges of walkways and driveways and fix any cracks.  Remove anything that might make you trip as you walk through a door, such as a raised step or threshold.  Trim any bushes or trees on the path to your home.  Use bright outdoor lighting.  Clear any walking paths of anything that might make someone trip, such as rocks or tools.  Regularly check to see if handrails are loose or broken. Make sure that both sides of any steps have handrails.  Any raised decks and porches should have guardrails on the edges.  Have any leaves, snow, or ice cleared regularly.  Use sand or salt on walking paths during winter.  Clean up any spills in your garage right away. This includes oil or grease spills. What can I do in the bathroom?  Use night lights.  Install grab bars by the toilet and in the tub and shower. Do not use towel bars as grab bars.  Use non-skid mats or decals in the tub or shower.  If you need to sit down in the shower, use a plastic, non-slip stool.  Keep the floor dry. Clean up any water that spills on the floor as soon as it  happens.  Remove soap buildup in the tub or shower regularly.  Attach bath mats securely with double-sided non-slip rug tape.  Do not have throw rugs and other things on the floor that can make you trip. What can I do in the bedroom?  Use night lights.  Make sure that you have a light by your bed that is easy to reach.  Do not use any sheets or blankets that are too big for your bed. They should not hang down onto the floor.  Have a firm chair that has side arms. You can use this for support while you get dressed.  Do not have throw rugs and other things on the floor that can make you trip. What can I do in the kitchen?  Clean up any spills right away.  Avoid walking on wet floors.  Keep items that you use a lot in easy-to-reach places.  If you need to reach something above you, use a strong step stool that has a grab bar.  Keep electrical cords out of the way.  Do not use floor polish or wax that makes floors slippery. If  you must use wax, use non-skid floor wax.  Do not have throw rugs and other things on the floor that can make you trip. What can I do with my stairs?  Do not leave any items on the stairs.  Make sure that there are handrails on both sides of the stairs and use them. Fix handrails that are broken or loose. Make sure that handrails are as long as the stairways.  Check any carpeting to make sure that it is firmly attached to the stairs. Fix any carpet that is loose or worn.  Avoid having throw rugs at the top or bottom of the stairs. If you do have throw rugs, attach them to the floor with carpet tape.  Make sure that you have a light switch at the top of the stairs and the bottom of the stairs. If you do not have them, ask someone to add them for you. What else can I do to help prevent falls?  Wear shoes that:  Do not have high heels.  Have rubber bottoms.  Are comfortable and fit you well.  Are closed at the toe. Do not wear sandals.  If you  use a stepladder:  Make sure that it is fully opened. Do not climb a closed stepladder.  Make sure that both sides of the stepladder are locked into place.  Ask someone to hold it for you, if possible.  Clearly mark and make sure that you can see:  Any grab bars or handrails.  First and last steps.  Where the edge of each step is.  Use tools that help you move around (mobility aids) if they are needed. These include:  Canes.  Walkers.  Scooters.  Crutches.  Turn on the lights when you go into a dark area. Replace any light bulbs as soon as they burn out.  Set up your furniture so you have a clear path. Avoid moving your furniture around.  If any of your floors are uneven, fix them.  If there are any pets around you, be aware of where they are.  Review your medicines with your doctor. Some medicines can make you feel dizzy. This can increase your chance of falling. Ask your doctor what other things that you can do to help prevent falls. This information is not intended to replace advice given to you by your health care provider. Make sure you discuss any questions you have with your health care provider. Document Released: 01/25/2009 Document Revised: 09/06/2015 Document Reviewed: 05/05/2014 Elsevier Interactive Patient Education  2017 [employer].

## 2017-05-25 NOTE — Progress Notes (Signed)
Subjective:   Kevin Schwartz is a 84 y.o. male who presents for an Initial Medicare Annual Wellness Visit.    Objective:    Today's Vitals   05/25/17 1005  BP: (!) 142/60  Pulse: 76  Temp: 98.1 F (36.7 C)  TempSrc: Oral  SpO2: 98%  Weight: 164 lb (74.4 kg)  Height: '5\' 7"'  (1.702 m)   Body mass index is 25.69 kg/m.  Advanced Directives 05/25/2017 09/18/2016 06/27/2016 06/25/2016 06/08/2016 06/06/2016 05/13/2016  Does Patient Have a Medical Advance Directive? No No No No No No No  Type of Advance Directive - - - - - - -  Does patient want to make changes to medical advance directive? - - - - - - -  Copy of [place] in Chart? - - - - - - -  Would patient like information on creating a medical advance directive? Yes (MAU/Ambulatory/Procedural Areas - Information given) No - Patient declined No - Patient declined - - No - Patient declined No - Patient declined    Current Medications (verified) Outpatient Encounter Medications as of 05/25/2017  Medication Sig  . ACCU-CHEK SOFTCLIX LANCETS lancets Use to test blood sugar daily. Dx:E11.8  . Alcohol Swabs (B-D SINGLE USE SWABS REGULAR) PADS Use with testing of blood sugar. Dx:E11.8  . allopurinol (ZYLOPRIM) 100 MG tablet Take 1 tablet (100 mg total) by mouth daily.  [name] amLODipine (NORVASC) 2.5 MG tablet Take 2 tablets (5 mg total) by mouth daily.  [name] aspirin 81 MG tablet Take 1 tablet (81 mg total) by mouth daily.  [name] atorvastatin (LIPITOR) 80 MG tablet Take 1 tablet (80 mg total) by mouth daily. For High Cholesterol  . Blood Glucose Monitoring Suppl (ACCU-CHEK AVIVA PLUS) w/Device KIT Use to check blood sugar daily. Dx:E11.8  . citalopram (CELEXA) 20 MG tablet Take 1 tablet (20 mg total) by mouth daily.  [name] glucose blood (ACCU-CHEK AVIVA PLUS) test strip Use to check blood sugar daily. Dx:E11.8  . metoprolol tartrate (LOPRESSOR) 50 MG tablet Take 1 tablet (50 mg total) by mouth 2 (two) times daily.  . nitroGLYCERIN  (NITROSTAT) 0.4 MG SL tablet Place 0.4 mg under the tongue every 5 (five) minutes as needed for chest pain. Up to 3 doses  . ULTRA-THIN II MINI PEN NEEDLE 31G X 5 MM MISC    Facility-Administered Encounter Medications as of 05/25/2017  Medication  . 0.9 %  sodium chloride infusion    Allergies (verified) Ace inhibitors; Losartan potassium; and Crestor [rosuvastatin]   History: Past Medical History:  Diagnosis Date  . CAD (coronary artery disease)    PTCA of OM in 1998, BMS OM in 2000, Cath 9/07 LM ok LAD ok. LCX 95% in OM prior to previous stent RCA. nondominant normal EF normal. Cypher DES to OM 2007 (PLACED PROXIAMAL TO PREVIOUS STENT)  . Chronic kidney disease, stage III (moderate) (HCC)   . Chronic kidney disease, unspecified   . Coronary atherosclerosis of unspecified type of vessel, native or graft   . Depression   . DM type 2 (diabetes mellitus, type 2) ([place])   . DM type 2 causing CKD stage 2 ([place]) 08/01/2011  . Gout, unspecified   . HLD (hyperlipidemia)   . HTN (hypertension)   . Loss of weight   . Memory loss   . MGUS (monoclonal gammopathy of unknown significance) 08/01/2011   IgG kappa dx 2002 8% plasma cells in bone marrow; no lesions on bone X-rays;  [name] Monoclonal paraproteinemia   .  Osteoarthrosis, unspecified whether generalized or localized, unspecified site   . Other atopic dermatitis and related conditions   . Peripheral vascular disease, unspecified ([place])   . Personality change due to conditions classified elsewhere   . PVD (peripheral vascular disease) ([place])   . Renal insufficiency   . S/P coronary artery stent placement 08/01/2011  . Spasm of muscle   . Thyroid disease   . Unspecified disorder of kidney and ureter   . Unspecified hearing loss   . Urinary tract infection, site not specified    Past Surgical History:  Procedure Laterality Date  . CAROTID STENT    . CORONARY ANGIOPLASTY    . TOTAL KNEE ARTHROPLASTY Left    Family History  Problem  Relation Age of Onset  . Heart disease Mother   . Heart disease Sister   . Heart disease Sister   . Cancer Sister        type unknown  . Hypertension Sister   . Diabetes Brother   . Diabetes Brother   . Diabetes Unknown   . Coronary artery disease Unknown   . Cancer Unknown    Social History   Socioeconomic History  . Marital status: Widowed    Spouse name: None  . Number of children: 1  . Years of education: None  . Highest education level: None  Social Needs  . Financial resource strain: Not hard at all  . Food insecurity - worry: Never true  . Food insecurity - inability: Never true  . Transportation needs - medical: No  . Transportation needs - non-medical: No  Occupational History  . Occupation: retired   Tobacco Use  . Smoking status: Former Smoker    Types: Cigarettes    Last attempt to quit: 02/01/2016    Years since quitting: 1.3  . Smokeless tobacco: Never Used  Substance and Sexual Activity  . Alcohol use: No  . Drug use: No  . Sexual activity: None  Other Topics Concern  . None  Social History Narrative   Widowed 02/2012   Lives along   Stopped smoking 2004   Alcohol none   Exercise works in [place] given: Not Answered   Clinical Intake:              How often do you need to have someone help you when you read instructions, pamphlets, or other written materials from your doctor or pharmacy?: 1 - Never What is the last grade level you completed in school?: [place]?: No  Information entered by :: Tyson Dense, RN  Activities of Daily Living In your present state of health, do you have any difficulty performing the following activities: 05/25/2017 06/25/2016  Hearing? N Y  Vision? N Y  Difficulty concentrating or making decisions? N N  Walking or climbing stairs? N Y  Dressing or bathing? N N  Doing errands, shopping? N N  Preparing Food and eating ? N Y    Using the Toilet? N N  In the past six months, have you accidently leaked urine? N N  Do you have problems with loss of bowel control? N N  Managing your Medications? N N  Managing your Finances? N Y  Housekeeping or managing your Housekeeping? N N  Some recent data might be hidden     Immunizations and Health Maintenance Immunization History  Administered Date(s) Administered  . Influenza, High Dose Seasonal PF 01/08/2017  .  Influenza,inj,Quad PF,6+ Mos 12/27/2012, 03/06/2014, 04/02/2015, 11/30/2015  . Pneumococcal Conjugate-13 09/22/2014  . Pneumococcal Polysaccharide-23 12/27/2012   Health Maintenance Due  Topic Date Due  . OPHTHALMOLOGY EXAM  04/14/2014  . FOOT EXAM  11/29/2016  . HEMOGLOBIN A1C  03/20/2017    Patient Care Team: Gayland Curry, DO as PCP - General (Geriatric Medicine) Bensimhon, Shaune Pascal, MD as Consulting Physician (Cardiology)  Indicate any recent Medical Services you may have received from other than [facility] providers in the past year (date may be approximate).    Assessment:   This is a routine wellness examination for Kevin Schwartz.  Hearing/Vision screen Hearing Screening Comments: Pt reports no issues with hearing Vision Screening Comments: Sees eye doctor annually  Dietary issues and exercise activities discussed: Current Exercise Habits: Home exercise routine, Type of exercise: walking, Time (Minutes): 20, Frequency (Times/Week): 3, Weekly Exercise (Minutes/Week): 60, Intensity: Mild, Exercise limited by: None identified  Goals    . Exercise 3x per week (30 min per time)     Patient would like to exercise more than he does now      Depression Screen PHQ 2/9 Scores 05/25/2017 05/11/2017 01/08/2017 09/18/2016  PHQ - 2 Score 0 0 0 0    Fall Risk Fall Risk  05/25/2017 05/11/2017 01/08/2017 09/18/2016 06/26/2016  Falls in the past year? No No No No Yes  Number falls in past yr: - - - - 2 or more  Injury with Fall? - - - - No  Risk Factor Category  - - - -  High Fall Risk  Risk for fall due to : - - - - History of fall(s);Impaired balance/gait;Impaired mobility;Impaired vision;Medication side effect  Follow up - - - - Falls prevention discussed    Is the patient's home free of loose throw rugs in walkways, pet beds, electrical cords, etc?   yes      Grab bars in the bathroom? yes      Handrails on the stairs?   yes      Adequate lighting?   yes  Timed Get Up and Go performed: 19 seconds, fall risk  Cognitive Function: MMSE - Mini Mental State Exam 05/09/2016 04/26/2015  Orientation to time 5 5  Orientation to Place 5 5  Registration 3 3  Attention/ Calculation 3 4  Recall 3 3  Language- name 2 objects 2 2  Language- repeat 1 1  Language- follow 3 step command 3 3  Language- read & follow direction 1 1  Write a sentence 1 1  Copy design 1 1  Total score 28 29        Screening Tests Health Maintenance  Topic Date Due  . OPHTHALMOLOGY EXAM  04/14/2014  . FOOT EXAM  11/29/2016  . HEMOGLOBIN A1C  03/20/2017  . TETANUS/TDAP  04/14/2020 (Originally 12/16/1949)  . URINE MICROALBUMIN  09/18/2017  . INFLUENZA VACCINE  Completed  . PNA vac Low Risk Adult  Completed    Qualifies for Shingles Vaccine? No, never had chicken pox  Cancer Screenings: Lung: Low Dose CT Chest recommended if Age 46-80 years, 30 pack-year currently smoking OR have quit w/in 15years. Patient does not qualify. Colorectal: up to date  Additional Screenings: Hepatitis B/HIV/Syphillis: Not indicated Hepatitis C Screening: Declined      Plan:  I have personally reviewed and addressed the Medicare Annual Wellness questionnaire and have noted the following in the patient's chart:  A. Medical and social history B. Use of alcohol, tobacco or illicit drugs  C.  Current medications and supplements D. Functional ability and status E.  Nutritional status F.  Physical activity G. Advance directives H. List of other physicians I.  Hospitalizations, surgeries, and  ER visits in previous 12 months J.  [place] to include hearing, vision, cognitive, depression L. Referrals and appointments - none  In addition, I have reviewed and discussed with patient certain preventive protocols, quality metrics, and best practice recommendations. A written personalized care plan for preventive services as well as general preventive health recommendations were provided to patient.  See attached scanned questionnaire for additional information.   Signed,   Tyson Dense, RN Nurse Health Advisor    Quick Notes   Health Maintenance: HgA1C and foot exam due.Going to eye doctor next week     Abnormal Screen: MMSE 27/30, passed clock drawing     Patient Concerns: None     Nurse Concerns: None

## 2017-07-04 ENCOUNTER — Other Ambulatory Visit: Payer: Self-pay | Admitting: Internal Medicine

## 2017-08-03 ENCOUNTER — Other Ambulatory Visit: Payer: Self-pay | Admitting: Internal Medicine

## 2017-08-03 DIAGNOSIS — I10 Essential (primary) hypertension: Secondary | ICD-10-CM

## 2017-09-10 ENCOUNTER — Other Ambulatory Visit: Payer: Medicare HMO

## 2017-09-10 DIAGNOSIS — E1122 Type 2 diabetes mellitus with diabetic chronic kidney disease: Secondary | ICD-10-CM | POA: Diagnosis not present

## 2017-09-10 DIAGNOSIS — I10 Essential (primary) hypertension: Secondary | ICD-10-CM | POA: Diagnosis not present

## 2017-09-10 DIAGNOSIS — N183 Chronic kidney disease, stage 3 unspecified: Secondary | ICD-10-CM

## 2017-09-11 LAB — CBC WITH DIFFERENTIAL/PLATELET
Basophils Absolute: 18 cells/uL (ref 0–200)
Basophils Relative: 0.3 %
Eosinophils Absolute: 216 cells/uL (ref 15–500)
Eosinophils Relative: 3.6 %
HCT: 33.6 % — ABNORMAL LOW (ref 38.5–50.0)
Hemoglobin: 11.5 g/dL — ABNORMAL LOW (ref 13.2–17.1)
Lymphs Abs: 1092 cells/uL (ref 850–3900)
MCH: 30.9 pg (ref 27.0–33.0)
MCHC: 34.2 g/dL (ref 32.0–36.0)
MCV: 90.3 fL (ref 80.0–100.0)
MPV: 10.7 fL (ref 7.5–12.5)
Monocytes Relative: 9.6 %
Neutro Abs: 4098 cells/uL (ref 1500–7800)
Neutrophils Relative %: 68.3 %
Platelets: 327 10*3/uL (ref 140–400)
RBC: 3.72 10*6/uL — ABNORMAL LOW (ref 4.20–5.80)
RDW: 13.7 % (ref 11.0–15.0)
Total Lymphocyte: 18.2 %
WBC mixed population: 576 cells/uL (ref 200–950)
WBC: 6 10*3/uL (ref 3.8–10.8)

## 2017-09-11 LAB — BASIC METABOLIC PANEL
BUN/Creatinine Ratio: 18 (calc) (ref 6–22)
BUN: 26 mg/dL — ABNORMAL HIGH (ref 7–25)
CO2: 25 mmol/L (ref 20–32)
Calcium: 9.6 mg/dL (ref 8.6–10.3)
Chloride: 108 mmol/L (ref 98–110)
Creat: 1.46 mg/dL — ABNORMAL HIGH (ref 0.70–1.11)
Glucose, Bld: 87 mg/dL (ref 65–99)
Potassium: 4.3 mmol/L (ref 3.5–5.3)
Sodium: 140 mmol/L (ref 135–146)

## 2017-09-11 LAB — HEMOGLOBIN A1C
Hgb A1c MFr Bld: 5.2 % of total Hgb (ref <5.7)
Mean Plasma Glucose: 103 (calc)
eAG (mmol/L): 5.7 (calc)

## 2017-09-14 ENCOUNTER — Encounter: Payer: Self-pay | Admitting: Internal Medicine

## 2017-09-14 ENCOUNTER — Ambulatory Visit: Payer: Medicare HMO | Admitting: Internal Medicine

## 2017-09-14 VITALS — BP 128/60 | HR 53 | Temp 98.5°F | Ht 67.0 in | Wt 155.0 lb

## 2017-09-14 DIAGNOSIS — E1122 Type 2 diabetes mellitus with diabetic chronic kidney disease: Secondary | ICD-10-CM | POA: Diagnosis not present

## 2017-09-14 DIAGNOSIS — I10 Essential (primary) hypertension: Secondary | ICD-10-CM

## 2017-09-14 DIAGNOSIS — E1169 Type 2 diabetes mellitus with other specified complication: Secondary | ICD-10-CM

## 2017-09-14 DIAGNOSIS — I2510 Atherosclerotic heart disease of native coronary artery without angina pectoris: Secondary | ICD-10-CM | POA: Diagnosis not present

## 2017-09-14 DIAGNOSIS — R634 Abnormal weight loss: Secondary | ICD-10-CM | POA: Diagnosis not present

## 2017-09-14 DIAGNOSIS — N183 Chronic kidney disease, stage 3 unspecified: Secondary | ICD-10-CM

## 2017-09-14 DIAGNOSIS — I739 Peripheral vascular disease, unspecified: Secondary | ICD-10-CM

## 2017-09-14 DIAGNOSIS — Z636 Dependent relative needing care at home: Secondary | ICD-10-CM | POA: Diagnosis not present

## 2017-09-14 DIAGNOSIS — E782 Mixed hyperlipidemia: Secondary | ICD-10-CM

## 2017-09-14 DIAGNOSIS — Z0000 Encounter for general adult medical examination without abnormal findings: Secondary | ICD-10-CM

## 2017-09-14 NOTE — Progress Notes (Signed)
Provider:  Rexene Edison. Mariea Clonts, D.O., C.M.D. Location:   [facility]  Place of Service:   clinic  Previous PCP: Gayland Curry, DO Patient Care Team: Gayland Curry, DO as PCP - General (Geriatric Medicine) Bensimhon, Shaune Pascal, MD as Consulting Physician (Cardiology)  Extended Emergency Contact Information Primary Emergency Contact: Darrol Angel [place] Mobile Phone: [phone] Relation: Sister  Code Status: Full code--he has the blank living will form but has not done this. Goals of Care: Advanced Directive information Advanced Directives 09/14/2017  Does Patient Have a Medical Advance Directive? No  Type of Advance Directive -  Does patient want to make changes to medical advance directive? -  Copy of [place] in Chart? -  Would patient like information on creating a medical advance directive? No - Patient declined   Chief Complaint  Patient presents with  . Annual Exam    CPE   HPI: Patient is a 84 y.o. male seen today for an annual physical exam.  Reviewed AWV.  Scored 28/30 on mmse.  Gradually trending down.  He has lost 9 more lbs.  He continues to take care of his brother who has dementia.  He is eating.  He says he's having three meals per day.  Breakfast, sandwich and juice for lunch and has supper.  Not taking any boost, ensure or glucerna.  No blood in stool, dark stool.    Has good and bad days with his mood.  Sleeps a little better than he had been.    No pain.    No chest pain or sob.   No numbness or tingling in his feet.  He's debating whether to stay with his brother.  He says if he leaves his brother, he will lose his house.  His two daughters don't want the house.  They aren't helping with anything.  He does have a home care company coming to give him a bath three times a week, his brother is getting speech therapy and physical therapy.   His son lives in [place].  He says he's busy in church and the veteran's office.   He is his health care and financial POA.   He has not completed a living will.      Asked again about eye exam which he claimed he was getting the next week when seen by Sherlyn Lick in Feb, but now says it's this month.  Reminded to ask them to send me a note.  Past Medical History:  Diagnosis Date  . CAD (coronary artery disease)    PTCA of OM in 1998, BMS OM in 2000, Cath 9/07 LM ok LAD ok. LCX 95% in OM prior to previous stent RCA. nondominant normal EF normal. Cypher DES to OM 2007 (PLACED PROXIAMAL TO PREVIOUS STENT)  . Chronic kidney disease, stage III (moderate) (HCC)   . Chronic kidney disease, unspecified   . Coronary atherosclerosis of unspecified type of vessel, native or graft   . Depression   . DM type 2 (diabetes mellitus, type 2) ([place])   . DM type 2 causing CKD stage 2 ([place]) 08/01/2011  . Gout, unspecified   . HLD (hyperlipidemia)   . HTN (hypertension)   . Loss of weight   . Memory loss   . MGUS (monoclonal gammopathy of unknown significance) 08/01/2011   IgG kappa dx 2002 8% plasma cells in bone marrow; no lesions on bone X-rays;  [name] Monoclonal paraproteinemia   . Osteoarthrosis, unspecified whether generalized  or localized, unspecified site   . Other atopic dermatitis and related conditions   . Peripheral vascular disease, unspecified ([place])   . Personality change due to conditions classified elsewhere   . PVD (peripheral vascular disease) ([place])   . Renal insufficiency   . S/P coronary artery stent placement 08/01/2011  . Spasm of muscle   . Thyroid disease   . Unspecified disorder of kidney and ureter   . Unspecified hearing loss   . Urinary tract infection, site not specified    Past Surgical History:  Procedure Laterality Date  . CAROTID STENT    . CORONARY ANGIOPLASTY    . TOTAL KNEE ARTHROPLASTY Left     reports that he quit smoking about 19 months ago. His smoking use included cigarettes. He has never used smokeless tobacco. He reports that he does not drink  alcohol or use drugs.  Functional Status Survey:  independent in functioning  Family History  Problem Relation Age of Onset  . Heart disease Mother   . Heart disease Sister   . Heart disease Sister   . Cancer Sister        type unknown  . Hypertension Sister   . Diabetes Brother   . Diabetes Brother   . Diabetes Unknown   . Coronary artery disease Unknown   . Cancer Unknown     Health Maintenance  Topic Date Due  . OPHTHALMOLOGY EXAM  04/14/2014  . FOOT EXAM  11/29/2016  . URINE MICROALBUMIN  09/18/2017  . INFLUENZA VACCINE  11/12/2017  . HEMOGLOBIN A1C  03/13/2018  . TETANUS/TDAP  04/14/2021  . PNA vac Low Risk Adult  Completed    Allergies  Allergen Reactions  . Ace Inhibitors Swelling    angioedema  . Losartan Potassium Swelling    Angioedema   . Crestor [Rosuvastatin] Swelling    Outpatient Encounter Medications as of 09/14/2017  Medication Sig  . ACCU-CHEK SOFTCLIX LANCETS lancets Use to test blood sugar daily. Dx:E11.8  . Alcohol Swabs (B-D SINGLE USE SWABS REGULAR) PADS Use with testing of blood sugar. Dx:E11.8  . allopurinol (ZYLOPRIM) 100 MG tablet Take 1 tablet by mouth daily.  [name] amLODipine (NORVASC) 2.5 MG tablet Take 2 tablets by mouth daily.  [name] aspirin 81 MG tablet Take 1 tablet (81 mg total) by mouth daily.  [name] atorvastatin (LIPITOR) 80 MG tablet Take 1 tablet (80 mg total) by mouth daily. For High Cholesterol  . citalopram (CELEXA) 20 MG tablet Take 1 tablet (20 mg total) by mouth daily.  [name] glucose blood (ACCU-CHEK AVIVA PLUS) test strip Use to check blood sugar daily. Dx:E11.8  . metoprolol tartrate (LOPRESSOR) 50 MG tablet Take 1 tablet by mouth twice daily.  . nitroGLYCERIN (NITROSTAT) 0.4 MG SL tablet Place 0.4 mg under the tongue every 5 (five) minutes as needed for chest pain. Up to 3 doses  . ULTRA-THIN II MINI PEN NEEDLE 31G X 5 MM MISC   . [DISCONTINUED] Blood Glucose Monitoring Suppl (ACCU-CHEK AVIVA PLUS) w/Device KIT Use to check blood  sugar daily. Dx:E11.8   Facility-Administered Encounter Medications as of 09/14/2017  Medication  . 0.9 %  sodium chloride infusion    Review of Systems  Constitutional: Positive for weight loss. Negative for chills, fever and malaise/fatigue.  HENT: Negative for congestion and hearing loss.   Eyes: Negative for blurred vision.       Glasses  Respiratory: Negative for cough and shortness of breath.   Cardiovascular: Negative for chest pain, palpitations and  leg swelling.  Gastrointestinal: Negative for abdominal pain, blood in stool, constipation, diarrhea and melena.  Genitourinary: Negative for dysuria.  Musculoskeletal: Positive for joint pain. Negative for myalgias.       Left knee  Skin: Negative for itching and rash.  Neurological: Negative for dizziness and loss of consciousness.       Stooped posture, poor foot clearance  Psychiatric/Behavioral: Negative for depression and memory loss. The patient has insomnia. The patient is not nervous/anxious.        Stress caring for his brother with dementia    Vitals:   09/14/17 0903  BP: 128/60  Pulse: (!) 53  Temp: 98.5 F (36.9 C)  TempSrc: Oral  SpO2: 99%  Weight: 155 lb (70.3 kg)  Height: '5\' 7"'  (1.702 m)   Body mass index is 24.28 kg/m. Physical Exam  Constitutional: He is oriented to person, place, and time. No distress.  HENT:  Head: Normocephalic and atraumatic.  Right Ear: External ear normal.  Left Ear: External ear normal.  Nose: Nose normal.  Mouth/Throat: Oropharynx is clear and moist.  Eyes: Pupils are equal, round, and reactive to light. Conjunctivae and EOM are normal.  glasses  Neck: Neck supple. No JVD present. No thyromegaly present.  Cardiovascular: Normal rate, regular rhythm and intact distal pulses.  Murmur heard. Pulmonary/Chest: Effort normal and breath sounds normal. No respiratory distress.  Abdominal: Soft. Bowel sounds are normal. He exhibits no distension. There is no tenderness.    Musculoskeletal: He exhibits tenderness.  Stooped posture, poor foot clearance, short steps; tenderness of left knee  Lymphadenopathy:    He has no cervical adenopathy.  Neurological: He is alert and oriented to person, place, and time. No cranial nerve deficit.  Skin: Skin is warm and dry. Capillary refill takes less than 2 seconds.  Dry scaly skin  Psychiatric:  Flat affect    Labs reviewed: Basic Metabolic Panel: Recent Labs    09/18/16 0848 05/07/17 0947 09/10/17 0947  NA 140 139 140  K 4.4 4.2 4.3  CL 108 107 108  CO2 '23 24 25  ' GLUCOSE 79 118* 87  BUN 22 20 26*  CREATININE 1.49* 1.72* 1.46*  CALCIUM 9.3 9.6 9.6   Liver Function Tests: Recent Labs    09/18/16 0848 05/07/17 0947  AST 21 23  ALT 20 21  ALKPHOS 79  --   BILITOT 0.3 0.4  PROT 7.6 7.7  ALBUMIN 3.9  --    No results for input(s): LIPASE, AMYLASE in the last 8760 hours. No results for input(s): AMMONIA in the last 8760 hours. CBC: Recent Labs    09/18/16 0848 05/07/17 0947 09/10/17 0947  WBC 6.3 5.8 6.0  NEUTROABS 4,221 3,921 4,098  HGB 11.9* 12.8* 11.5*  HCT 36.0* 37.5* 33.6*  MCV 92.8 89.5 90.3  PLT 532* 370 327   Cardiac Enzymes: No results for input(s): CKTOTAL, CKMB, CKMBINDEX, TROPONINI in the last 8760 hours. BNP: Invalid input(s): POCBNP Lab Results  Component Value Date   HGBA1C 5.2 09/10/2017   Lab Results  Component Value Date   TSH 3.240 08/25/2012   No results found for: VITAMINB12 No results found for: FOLATE Lab Results  Component Value Date   IRON 66 01/20/2011   TIBC 230 01/20/2011   FERRITIN 492 (H) 01/20/2011    Imaging and Procedures Recently: EKG performed today and reviewed:  Old anteroseptal MI, first degree AV block, no acute ischemia or infarct  Assessment/Plan 1. Annual physical exam -performed today  2. Essential  hypertension, benign -bp is well controlled - EKG 12-Lead performed and w/o acute change--pt claims he sees Dr. Haroldine Laws but has  not been since 1/18  3. Peripheral vascular disease, unspecified ([place]) -chronic, but no acute ischemic symptoms  4. Caregiver stress -caring for his brother, but seems to be neglecting himself to some extent--he continues to lose weight despite no significant changes in labs and denying new symptoms like melena, hematochezia  5. Controlled type 2 diabetes mellitus with stage 3 chronic kidney disease, without long-term current use of insulin (HCC) -hba1c normal for the past 3 years -foot exam done today -eye exam overdue  6. Mixed hyperlipidemia due to type 2 diabetes mellitus ([place]) -cont lipitor therapy due to DMII at this point  7. Chronic kidney disease, stage III (moderate) (HCC) -improved a bit from last visit Avoid nephrotoxic agents like nsaids, dose adjust renally excreted meds, hydrate.  8. Weight loss, non-intentional -ongoing, cause not found, encouraged boost supplements daily and increased food intake--suspect he's taking care of his brother and neglecting himself -just learned that his son lives locally, but is not involved (he says he's his HCPOA) -anemia worse again as weight trends down  9. Atherosclerosis of native coronary artery of native heart without angina pectoris -asymptomatic, prior MI on ekg -cont secondary prevention with asa, statin, bp and glucose control  Labs/tests ordered:   Orders Placed This Encounter  Procedures  . EKG 12-Lead   12 boost shakes given today as samples  Chloe Bluett L. Shakiera Edelson, D.O. [facility] [address], [place] [address] Cell Phone (Mon-Fri 8am-5pm):  [phone] On Call:  [phone] & follow prompts after 5pm & weekends Office Phone:  [phone] Office Fax:  [phone]

## 2017-09-22 DIAGNOSIS — E1121 Type 2 diabetes mellitus with diabetic nephropathy: Secondary | ICD-10-CM | POA: Diagnosis not present

## 2017-09-22 DIAGNOSIS — N183 Chronic kidney disease, stage 3 (moderate): Secondary | ICD-10-CM | POA: Diagnosis not present

## 2017-09-22 DIAGNOSIS — D509 Iron deficiency anemia, unspecified: Secondary | ICD-10-CM | POA: Diagnosis not present

## 2017-09-22 DIAGNOSIS — K4090 Unilateral inguinal hernia, without obstruction or gangrene, not specified as recurrent: Secondary | ICD-10-CM | POA: Diagnosis not present

## 2017-09-22 DIAGNOSIS — R809 Proteinuria, unspecified: Secondary | ICD-10-CM | POA: Diagnosis not present

## 2017-09-22 DIAGNOSIS — T783XXA Angioneurotic edema, initial encounter: Secondary | ICD-10-CM | POA: Diagnosis not present

## 2017-09-22 DIAGNOSIS — D472 Monoclonal gammopathy: Secondary | ICD-10-CM | POA: Diagnosis not present

## 2017-09-22 DIAGNOSIS — I150 Renovascular hypertension: Secondary | ICD-10-CM | POA: Diagnosis not present

## 2017-10-07 ENCOUNTER — Ambulatory Visit: Payer: Self-pay | Admitting: Surgery

## 2017-10-07 DIAGNOSIS — K4020 Bilateral inguinal hernia, without obstruction or gangrene, not specified as recurrent: Secondary | ICD-10-CM | POA: Diagnosis not present

## 2017-10-07 DIAGNOSIS — Z01818 Encounter for other preprocedural examination: Secondary | ICD-10-CM | POA: Diagnosis not present

## 2017-10-07 NOTE — H&P (Signed)
Kevin Schwartz Documented: 10/07/2017 10:10 AM Location: [facility] Patient #: [id number] DOB: 1931-02-11 Widowed / Language: Cleophus Molt / Race: Black or African American Male  History of Present Illness Adin Hector MD; 10/07/2017 1:30 PM) The patient is a 84 year old male who presents with an inguinal hernia. Note for "Inguinal hernia": ` ` ` Patient sent for surgical consultation at the request of Jen Mow, PA. [facility]  Chief Complaint: Right inguinal hernia  The patient is a pleasant elderly gentleman. Noticed bulging in his right groin a few weeks ago. Not particularly painful but concerning. She did to his nephrologist who follows him for some chronic kidney disease. Physician assistant concern for inguinal hernia. Surgical consultation requested. He takes care of his brother who is demented. His wife passed away 4 years ago. He is had a lot of stressors but tries to stay active. He did have coronary stents placed in 1998. No cardiac issues since. Just on low-dose aspirin. He can walk at least 15 minutes without difficulty. Usually has left knee soreness makes him stop. His bowels about every other day. No prior abdominal or inguinal hernias. Denies any nocturia or hesitancy or frequency. No prostate issues that he is aware of. He does not smoke. He is not a diabetic.  (Review of systems as stated in this history (HPI) or in the review of systems. Otherwise all other 12 point ROS are negative) ` ` `   Past Surgical History Sabino Gasser; 10/07/2017 10:10 AM) Valve Replacement  Allergies Sabino Gasser; 10/07/2017 10:11 AM) No Known Drug Allergies [10/07/2017]: Allergies Reconciled  Medication History Sabino Gasser; 10/07/2017 10:12 AM) Allopurinol (100MG  Tablet, Oral) Active. AmLODIPine Besylate (2.5MG  Tablet, Oral) Active. Atorvastatin Calcium (80MG  Tablet, Oral) Active. Citalopram Hydrobromide (20MG  Tablet,  Oral) Active. MetFORMIN HCl (500MG  Tablet, Oral) Active. Metoprolol Tartrate (50MG  Tablet, Oral) Active. Medications Reconciled  Social History Sabino Gasser; 10/07/2017 10:10 AM) Alcohol use Moderate alcohol use. Caffeine use Coffee. Tobacco use Current some day smoker.  Family History Sabino Gasser; 10/07/2017 10:10 AM) Depression Brother. Heart Disease Family Members In General.  Other Problems Sabino Gasser; 10/07/2017 10:10 AM) Arthritis Congestive Heart Failure Diabetes Mellitus Myocardial infarction Sleep Apnea     Review of Systems Sabino Gasser; 10/07/2017 10:10 AM) General Present- Weight Loss. Not Present- Appetite Loss, Chills, Fatigue, Fever, Night Sweats and Weight Gain. Skin Present- Dryness. Not Present- Change in Wart/Mole, Hives, Jaundice, New Lesions, Non-Healing Wounds, Rash and Ulcer. HEENT Present- Wears glasses/contact lenses. Not Present- Earache, Hearing Loss, Hoarseness, Nose Bleed, Oral Ulcers, Ringing in the Ears, Seasonal Allergies, Sinus Pain, Sore Throat, Visual Disturbances and Yellow Eyes. Gastrointestinal Present- Abdominal Pain. Not Present- Bloating, Bloody Stool, Change in Bowel Habits, Chronic diarrhea, Constipation, Difficulty Swallowing, Excessive gas, Gets full quickly at meals, Hemorrhoids, Indigestion, Nausea, Rectal Pain and Vomiting.  Vitals Sabino Gasser; 10/07/2017 10:13 AM) 10/07/2017 10:12 AM Weight: 155.5 lb Height: 68in Body Surface Area: 1.84 m Body Mass Index: 23.64 kg/m  Temp.: 98.83F(Oral)  Pulse: 60 (Regular)  BP: 132/80 (Sitting, Left Arm, Standard)      Physical Exam Adin Hector MD; 10/07/2017 10:41 AM)  General Mental Status-Alert. General Appearance-Not in acute distress, Not Sickly. Orientation-Oriented X3. Hydration-Well hydrated. Voice-Normal. Note: Moves somewhat slowly but steadily. Mentally sharp.  Integumentary Global Assessment Upon inspection and  palpation of skin surfaces of the - Axillae: non-tender, no inflammation or ulceration, no drainage. and Distribution of scalp and body hair is normal. General Characteristics Temperature - normal warmth  is noted.  Head and Neck Head-normocephalic, atraumatic with no lesions or palpable masses. Face Global Assessment - atraumatic, no absence of expression. Neck Global Assessment - no abnormal movements, no bruit auscultated on the right, no bruit auscultated on the left, no decreased range of motion, non-tender. Trachea-midline. Thyroid Gland Characteristics - non-tender.  Eye Eyeball - Left-Extraocular movements intact, No Nystagmus. Eyeball - Right-Extraocular movements intact, No Nystagmus. Cornea - Left-No Hazy. Cornea - Right-No Hazy. Sclera/Conjunctiva - Left-No scleral icterus, No Discharge. Sclera/Conjunctiva - Right-No scleral icterus, No Discharge. Pupil - Left-Direct reaction to light normal. Pupil - Right-Direct reaction to light normal.  ENMT Ears Pinna - Left - no drainage observed, no generalized tenderness observed. Right - no drainage observed, no generalized tenderness observed. Nose and Sinuses External Inspection of the Nose - no destructive lesion observed. Inspection of the nares - Left - quiet respiration. Right - quiet respiration. Mouth and Throat Lips - Upper Lip - no fissures observed, no pallor noted. Lower Lip - no fissures observed, no pallor noted. Nasopharynx - no discharge present. Oral Cavity/Oropharynx - Tongue - no dryness observed. Oral Mucosa - no cyanosis observed. Hypopharynx - no evidence of airway distress observed.  Chest and Lung Exam Inspection Movements - Normal and Symmetrical. Accessory muscles - No use of accessory muscles in breathing. Palpation Palpation of the chest reveals - Non-tender. Auscultation Breath sounds - Normal and Clear.  Cardiovascular Auscultation Rhythm - Regular. Murmurs & Other Heart  Sounds - Auscultation of the heart reveals - No Murmurs and No Systolic Clicks.  Abdomen Inspection Inspection of the abdomen reveals - No Visible peristalsis and No Abnormal pulsations. Umbilicus - No Bleeding, No Urine drainage. Palpation/Percussion Palpation and Percussion of the abdomen reveal - Soft, Non Tender, No Rebound tenderness, No Rigidity (guarding) and No Cutaneous hyperesthesia. Note: Abdomen soft. Nontender. Not distended. No umbilical or incisional hernias. No guarding.  Male Genitourinary Sexual Maturity Tanner 5 - Adult hair pattern and Adult penile size and shape. Note: Obvious right groin bulging consistent with inguinal hernia suspect a knuckle of small intestine within it - reasonable. Obvious but smaller left inguinal hernias well. Otherwise normal external male genitalia without any testicular masses or other abnormalities.  Peripheral Vascular Upper Extremity Inspection - Left - No Cyanotic nailbeds, Not Ischemic. Right - No Cyanotic nailbeds, Not Ischemic.  Neurologic Neurologic evaluation reveals -normal attention span and ability to concentrate, able to name objects and repeat phrases. Appropriate fund of knowledge , normal sensation and normal coordination. Mental Status Affect - not angry, not paranoid. Cranial Nerves-Normal Bilaterally. Gait-Normal.  Neuropsychiatric Mental status exam performed with findings of-able to articulate well with normal speech/language, rate, volume and coherence, thought content normal with ability to perform basic computations and apply abstract reasoning and no evidence of hallucinations, delusions, obsessions or homicidal/suicidal ideation.  Musculoskeletal Global Assessment Spine, Ribs and Pelvis - no instability, subluxation or laxity. Right Upper Extremity - no instability, subluxation or laxity. Note: Mild stiffness in back and knees but gradually can transitioned slowly  Lymphatic Head &  Neck  General Head & Neck Lymphatics: Bilateral - Description - No Localized lymphadenopathy. Axillary  General Axillary Region: Bilateral - Description - No Localized lymphadenopathy. Femoral & Inguinal  Generalized Femoral & Inguinal Lymphatics: Left - Description - No Localized lymphadenopathy. Right - Description - No Localized lymphadenopathy.    Assessment & Plan Adin Hector MD; 10/07/2017 1:28 PM)  BILATERAL INGUINAL HERNIA WITHOUT OBSTRUCTION OR GANGRENE, RECURRENCE NOT SPECIFIED (K40.20) Impression: Elderly but  active male with right greater than left inguinal hernias. Bulging on the right side. Standard of care would be repair. Laparoscopic underlay repair with mesh. While he is of advanced age, he is quite physically active and independent. He wishes to be aggressive and have these fixed.  Given his advanced age, we will offer perioperative Flomax to minimize risk of urinary retention. He doesn't seem to have a particularly high urinary urgency score, but would like to protect that just in case   PREOP - ING HERNIA - ENCOUNTER FOR PREOPERATIVE EXAMINATION FOR GENERAL SURGICAL PROCEDURE (Z01.818)  Current Plans You are being scheduled for surgery- Our schedulers will call you.  You should hear from our office's scheduling department within 5 working days about the location, date, and time of surgery. We try to make accommodations for patient's preferences in scheduling surgery, but sometimes the OR schedule or the surgeon's schedule prevents [place] from making those accommodations.  If you have not heard from our office [phone]) in 5 working days, call the office and ask for your surgeon's nurse.  If you have other questions about your diagnosis, plan, or surgery, call the office and ask for your surgeon's nurse.  Written instructions provided The anatomy & physiology of the abdominal wall and pelvic floor was discussed. The pathophysiology of hernias in the  inguinal and pelvic region was discussed. Natural history risks such as progressive enlargement, pain, incarceration, and strangulation was discussed. Contributors to complications such as smoking, obesity, diabetes, prior surgery, etc were discussed.  I feel the risks of no intervention will lead to serious problems that outweigh the operative risks; therefore, I recommended surgery to reduce and repair the hernia. I explained laparoscopic techniques with possible need for an open approach. I noted usual use of mesh to patch and/or buttress hernia repair  Risks such as bleeding, infection, abscess, need for further treatment, heart attack, death, and other risks were discussed. I noted a good likelihood this will help address the problem. Goals of post-operative recovery were discussed as well. Possibility that this will not correct all symptoms was explained. I stressed the importance of low-impact activity, aggressive pain control, avoiding constipation, & not pushing through pain to minimize risk of post-operative chronic pain or injury. Possibility of reherniation was discussed. We will work to minimize complications.  An educational handout further explaining the pathology & treatment options was given as well. Questions were answered. The patient expresses understanding & wishes to proceed with surgery.  Pt Education - Pamphlet Given - Laparoscopic Hernia Repair: discussed with patient and provided information. Pt Education - CCS Pain Control (Gross) Pt Education - CCS Hernia Post-Op HCI (Gross): discussed with patient and provided information. Pt Education - CCS Mesh education: discussed with patient and provided information. Started Tamsulosin HCl 0.4 MG Oral Capsule, 1 (one) Capsule daily, #14, 14 days starting 10/07/2017, Ref. x1. Local Order: Start one week before surgery to shrink the prostate and help with urination around the time of surgery (and avoid a  catheter)  Adin Hector, MD, FACS, MASCRS Gastrointestinal and Minimally Invasive Surgery    [address] [place], [place] [address] [phone] Main / Paging [phone] Fax

## 2017-10-07 NOTE — H&P (View-Only) (Signed)
Duwayne Heck Documented: 10/07/2017 10:10 AM Location: [facility] Patient #: [id number] DOB: [birth year] Widowed / Language: Cleophus Molt / Race: Black or African American Male  History of Present Illness Adin Hector MD; 10/07/2017 1:30 PM) The patient is a 84 year old male who presents with an inguinal hernia. Note for "Inguinal hernia": ` ` ` Patient sent for surgical consultation at the request of Jen Mow, PA. [facility]  Chief Complaint: Right inguinal hernia  The patient is a pleasant elderly gentleman. Noticed bulging in his right groin a few weeks ago. Not particularly painful but concerning. She did to his nephrologist who follows him for some chronic kidney disease. Physician assistant concern for inguinal hernia. Surgical consultation requested. He takes care of his brother who is demented. His wife passed away 4 years ago. He is had a lot of stressors but tries to stay active. He did have coronary stents placed in 1998. No cardiac issues since. Just on low-dose aspirin. He can walk at least 15 minutes without difficulty. Usually has left knee soreness makes him stop. His bowels about every other day. No prior abdominal or inguinal hernias. Denies any nocturia or hesitancy or frequency. No prostate issues that he is aware of. He does not smoke. He is not a diabetic.  (Review of systems as stated in this history (HPI) or in the review of systems. Otherwise all other 12 point ROS are negative) ` ` `   Past Surgical History Sabino Gasser; 10/07/2017 10:10 AM) Valve Replacement  Allergies Sabino Gasser; 10/07/2017 10:11 AM) No Known Drug Allergies [10/07/2017]: Allergies Reconciled  Medication History Sabino Gasser; 10/07/2017 10:12 AM) Allopurinol (100MG  Tablet, Oral) Active. AmLODIPine Besylate (2.5MG  Tablet, Oral) Active. Atorvastatin Calcium (80MG  Tablet, Oral) Active. Citalopram Hydrobromide (20MG  Tablet,  Oral) Active. MetFORMIN HCl (500MG  Tablet, Oral) Active. Metoprolol Tartrate (50MG  Tablet, Oral) Active. Medications Reconciled  Social History Sabino Gasser; 10/07/2017 10:10 AM) Alcohol use Moderate alcohol use. Caffeine use Coffee. Tobacco use Current some day smoker.  Family History Sabino Gasser; 10/07/2017 10:10 AM) Depression Brother. Heart Disease Family Members In General.  Other Problems Sabino Gasser; 10/07/2017 10:10 AM) Arthritis Congestive Heart Failure Diabetes Mellitus Myocardial infarction Sleep Apnea     Review of Systems Sabino Gasser; 10/07/2017 10:10 AM) General Present- Weight Loss. Not Present- Appetite Loss, Chills, Fatigue, Fever, Night Sweats and Weight Gain. Skin Present- Dryness. Not Present- Change in Wart/Mole, Hives, Jaundice, New Lesions, Non-Healing Wounds, Rash and Ulcer. HEENT Present- Wears glasses/contact lenses. Not Present- Earache, Hearing Loss, Hoarseness, Nose Bleed, Oral Ulcers, Ringing in the Ears, Seasonal Allergies, Sinus Pain, Sore Throat, Visual Disturbances and Yellow Eyes. Gastrointestinal Present- Abdominal Pain. Not Present- Bloating, Bloody Stool, Change in Bowel Habits, Chronic diarrhea, Constipation, Difficulty Swallowing, Excessive gas, Gets full quickly at meals, Hemorrhoids, Indigestion, Nausea, Rectal Pain and Vomiting.  Vitals Sabino Gasser; 10/07/2017 10:13 AM) 10/07/2017 10:12 AM Weight: 155.5 lb Height: 68in Body Surface Area: 1.84 m Body Mass Index: 23.64 kg/m  Temp.: 98.54F(Oral)  Pulse: 60 (Regular)  BP: 132/80 (Sitting, Left Arm, Standard)      Physical Exam Adin Hector MD; 10/07/2017 10:41 AM)  General Mental Status-Alert. General Appearance-Not in acute distress, Not Sickly. Orientation-Oriented X3. Hydration-Well hydrated. Voice-Normal. Note: Moves somewhat slowly but steadily. Mentally sharp.  Integumentary Global Assessment Upon inspection and  palpation of skin surfaces of the - Axillae: non-tender, no inflammation or ulceration, no drainage. and Distribution of scalp and body hair is normal. General Characteristics Temperature - normal warmth  is noted.  Head and Neck Head-normocephalic, atraumatic with no lesions or palpable masses. Face Global Assessment - atraumatic, no absence of expression. Neck Global Assessment - no abnormal movements, no bruit auscultated on the right, no bruit auscultated on the left, no decreased range of motion, non-tender. Trachea-midline. Thyroid Gland Characteristics - non-tender.  Eye Eyeball - Left-Extraocular movements intact, No Nystagmus. Eyeball - Right-Extraocular movements intact, No Nystagmus. Cornea - Left-No Hazy. Cornea - Right-No Hazy. Sclera/Conjunctiva - Left-No scleral icterus, No Discharge. Sclera/Conjunctiva - Right-No scleral icterus, No Discharge. Pupil - Left-Direct reaction to light normal. Pupil - Right-Direct reaction to light normal.  ENMT Ears Pinna - Left - no drainage observed, no generalized tenderness observed. Right - no drainage observed, no generalized tenderness observed. Nose and Sinuses External Inspection of the Nose - no destructive lesion observed. Inspection of the nares - Left - quiet respiration. Right - quiet respiration. Mouth and Throat Lips - Upper Lip - no fissures observed, no pallor noted. Lower Lip - no fissures observed, no pallor noted. Nasopharynx - no discharge present. Oral Cavity/Oropharynx - Tongue - no dryness observed. Oral Mucosa - no cyanosis observed. Hypopharynx - no evidence of airway distress observed.  Chest and Lung Exam Inspection Movements - Normal and Symmetrical. Accessory muscles - No use of accessory muscles in breathing. Palpation Palpation of the chest reveals - Non-tender. Auscultation Breath sounds - Normal and Clear.  Cardiovascular Auscultation Rhythm - Regular. Murmurs & Other Heart  Sounds - Auscultation of the heart reveals - No Murmurs and No Systolic Clicks.  Abdomen Inspection Inspection of the abdomen reveals - No Visible peristalsis and No Abnormal pulsations. Umbilicus - No Bleeding, No Urine drainage. Palpation/Percussion Palpation and Percussion of the abdomen reveal - Soft, Non Tender, No Rebound tenderness, No Rigidity (guarding) and No Cutaneous hyperesthesia. Note: Abdomen soft. Nontender. Not distended. No umbilical or incisional hernias. No guarding.  Male Genitourinary Sexual Maturity Tanner 5 - Adult hair pattern and Adult penile size and shape. Note: Obvious right groin bulging consistent with inguinal hernia suspect a knuckle of small intestine within it - reasonable. Obvious but smaller left inguinal hernias well. Otherwise normal external male genitalia without any testicular masses or other abnormalities.  Peripheral Vascular Upper Extremity Inspection - Left - No Cyanotic nailbeds, Not Ischemic. Right - No Cyanotic nailbeds, Not Ischemic.  Neurologic Neurologic evaluation reveals -normal attention span and ability to concentrate, able to name objects and repeat phrases. Appropriate fund of knowledge , normal sensation and normal coordination. Mental Status Affect - not angry, not paranoid. Cranial Nerves-Normal Bilaterally. Gait-Normal.  Neuropsychiatric Mental status exam performed with findings of-able to articulate well with normal speech/language, rate, volume and coherence, thought content normal with ability to perform basic computations and apply abstract reasoning and no evidence of hallucinations, delusions, obsessions or homicidal/suicidal ideation.  Musculoskeletal Global Assessment Spine, Ribs and Pelvis - no instability, subluxation or laxity. Right Upper Extremity - no instability, subluxation or laxity. Note: Mild stiffness in back and knees but gradually can transitioned slowly  Lymphatic Head &  Neck  General Head & Neck Lymphatics: Bilateral - Description - No Localized lymphadenopathy. Axillary  General Axillary Region: Bilateral - Description - No Localized lymphadenopathy. Femoral & Inguinal  Generalized Femoral & Inguinal Lymphatics: Left - Description - No Localized lymphadenopathy. Right - Description - No Localized lymphadenopathy.    Assessment & Plan Adin Hector MD; 10/07/2017 1:28 PM)  BILATERAL INGUINAL HERNIA WITHOUT OBSTRUCTION OR GANGRENE, RECURRENCE NOT SPECIFIED (K40.20) Impression: Elderly but  active male with right greater than left inguinal hernias. Bulging on the right side. Standard of care would be repair. Laparoscopic underlay repair with mesh. While he is of advanced age, he is quite physically active and independent. He wishes to be aggressive and have these fixed.  Given his advanced age, we will offer perioperative Flomax to minimize risk of urinary retention. He doesn't seem to have a particularly high urinary urgency score, but would like to protect that just in case   PREOP - ING HERNIA - ENCOUNTER FOR PREOPERATIVE EXAMINATION FOR GENERAL SURGICAL PROCEDURE (Z01.818)  Current Plans You are being scheduled for surgery- Our schedulers will call you.  You should hear from our office's scheduling department within 5 working days about the location, date, and time of surgery. We try to make accommodations for patient's preferences in scheduling surgery, but sometimes the OR schedule or the surgeon's schedule prevents [place] from making those accommodations.  If you have not heard from our office [phone]) in 5 working days, call the office and ask for your surgeon's nurse.  If you have other questions about your diagnosis, plan, or surgery, call the office and ask for your surgeon's nurse.  Written instructions provided The anatomy & physiology of the abdominal wall and pelvic floor was discussed. The pathophysiology of hernias in the  inguinal and pelvic region was discussed. Natural history risks such as progressive enlargement, pain, incarceration, and strangulation was discussed. Contributors to complications such as smoking, obesity, diabetes, prior surgery, etc were discussed.  I feel the risks of no intervention will lead to serious problems that outweigh the operative risks; therefore, I recommended surgery to reduce and repair the hernia. I explained laparoscopic techniques with possible need for an open approach. I noted usual use of mesh to patch and/or buttress hernia repair  Risks such as bleeding, infection, abscess, need for further treatment, heart attack, death, and other risks were discussed. I noted a good likelihood this will help address the problem. Goals of post-operative recovery were discussed as well. Possibility that this will not correct all symptoms was explained. I stressed the importance of low-impact activity, aggressive pain control, avoiding constipation, & not pushing through pain to minimize risk of post-operative chronic pain or injury. Possibility of reherniation was discussed. We will work to minimize complications.  An educational handout further explaining the pathology & treatment options was given as well. Questions were answered. The patient expresses understanding & wishes to proceed with surgery.  Pt Education - Pamphlet Given - Laparoscopic Hernia Repair: discussed with patient and provided information. Pt Education - [facility] Pain Control (Gross) Pt Education - [facility] Hernia Post-Op HCI (Gross): discussed with patient and provided information. Pt Education - [facility] Mesh education: discussed with patient and provided information. Started Tamsulosin HCl 0.4 MG Oral Capsule, 1 (one) Capsule daily, #14, 14 days starting 10/07/2017, Ref. x1. Local Order: Start one week before surgery to shrink the prostate and help with urination around the time of surgery (and avoid a  catheter)  Adin Hector, MD, FACS, MASCRS [facility]ally [facility]    [address] [place], [place] [address] [phone] Main / Paging [phone] Fax

## 2017-10-11 ENCOUNTER — Other Ambulatory Visit: Payer: Self-pay | Admitting: Internal Medicine

## 2017-10-29 ENCOUNTER — Other Ambulatory Visit: Payer: Self-pay

## 2017-10-29 ENCOUNTER — Encounter (HOSPITAL_BASED_OUTPATIENT_CLINIC_OR_DEPARTMENT_OTHER): Payer: Self-pay | Admitting: *Deleted

## 2017-10-29 NOTE — Progress Notes (Addendum)
Spoke w/ pt via phone for pre-op interview.  Npo after mn w/ exception clear liquids until 0700 then nothing by mouth.  Pt unable to pick up ensure pre-surgery drink today.  Will take lopressor and norvasc am dos w/ sips of water.  Needs istat 8.  Current ekg in chart and epic.  Pt nephrologist note dated 09-22-2017 in chart. Asked pt to bring cpap mask and tubing , pt stated he is unable to bring it.

## 2017-10-30 ENCOUNTER — Encounter (HOSPITAL_BASED_OUTPATIENT_CLINIC_OR_DEPARTMENT_OTHER)
Admission: RE | Disposition: A | Discharge status: Home / Self Care | Payer: Self-pay | Source: Ambulatory Visit | Attending: Surgery

## 2017-10-30 ENCOUNTER — Ambulatory Visit (HOSPITAL_BASED_OUTPATIENT_CLINIC_OR_DEPARTMENT_OTHER): Payer: Medicare HMO | Admitting: Anesthesiology

## 2017-10-30 ENCOUNTER — Encounter (HOSPITAL_BASED_OUTPATIENT_CLINIC_OR_DEPARTMENT_OTHER): Payer: Self-pay | Admitting: Anesthesiology

## 2017-10-30 ENCOUNTER — Other Ambulatory Visit: Payer: Self-pay

## 2017-10-30 ENCOUNTER — Ambulatory Visit (HOSPITAL_BASED_OUTPATIENT_CLINIC_OR_DEPARTMENT_OTHER)
Admission: RE | Admit: 2017-10-30 | Discharge: 2017-10-30 | Disposition: A | Discharge status: Home / Self Care | Payer: Medicare HMO | Source: Ambulatory Visit | Attending: Surgery | Admitting: Surgery

## 2017-10-30 DIAGNOSIS — I509 Heart failure, unspecified: Secondary | ICD-10-CM | POA: Insufficient documentation

## 2017-10-30 DIAGNOSIS — K4020 Bilateral inguinal hernia, without obstruction or gangrene, not specified as recurrent: Secondary | ICD-10-CM | POA: Insufficient documentation

## 2017-10-30 DIAGNOSIS — R011 Cardiac murmur, unspecified: Secondary | ICD-10-CM | POA: Diagnosis not present

## 2017-10-30 DIAGNOSIS — E1122 Type 2 diabetes mellitus with diabetic chronic kidney disease: Secondary | ICD-10-CM | POA: Insufficient documentation

## 2017-10-30 DIAGNOSIS — E1151 Type 2 diabetes mellitus with diabetic peripheral angiopathy without gangrene: Secondary | ICD-10-CM | POA: Diagnosis not present

## 2017-10-30 DIAGNOSIS — Z96652 Presence of left artificial knee joint: Secondary | ICD-10-CM | POA: Diagnosis not present

## 2017-10-30 DIAGNOSIS — I2510 Atherosclerotic heart disease of native coronary artery without angina pectoris: Secondary | ICD-10-CM | POA: Insufficient documentation

## 2017-10-30 DIAGNOSIS — N183 Chronic kidney disease, stage 3 (moderate): Secondary | ICD-10-CM | POA: Insufficient documentation

## 2017-10-30 DIAGNOSIS — K4190 Unilateral femoral hernia, without obstruction or gangrene, not specified as recurrent: Secondary | ICD-10-CM | POA: Insufficient documentation

## 2017-10-30 DIAGNOSIS — M1990 Unspecified osteoarthritis, unspecified site: Secondary | ICD-10-CM | POA: Diagnosis not present

## 2017-10-30 DIAGNOSIS — I130 Hypertensive heart and chronic kidney disease with heart failure and stage 1 through stage 4 chronic kidney disease, or unspecified chronic kidney disease: Secondary | ICD-10-CM | POA: Insufficient documentation

## 2017-10-30 DIAGNOSIS — Z79899 Other long term (current) drug therapy: Secondary | ICD-10-CM | POA: Insufficient documentation

## 2017-10-30 DIAGNOSIS — G4730 Sleep apnea, unspecified: Secondary | ICD-10-CM | POA: Diagnosis not present

## 2017-10-30 DIAGNOSIS — N2581 Secondary hyperparathyroidism of renal origin: Secondary | ICD-10-CM | POA: Diagnosis not present

## 2017-10-30 DIAGNOSIS — Z888 Allergy status to other drugs, medicaments and biological substances status: Secondary | ICD-10-CM | POA: Insufficient documentation

## 2017-10-30 DIAGNOSIS — Z87891 Personal history of nicotine dependence: Secondary | ICD-10-CM | POA: Diagnosis not present

## 2017-10-30 DIAGNOSIS — Z8249 Family history of ischemic heart disease and other diseases of the circulatory system: Secondary | ICD-10-CM | POA: Insufficient documentation

## 2017-10-30 DIAGNOSIS — I252 Old myocardial infarction: Secondary | ICD-10-CM | POA: Diagnosis not present

## 2017-10-30 DIAGNOSIS — Z7984 Long term (current) use of oral hypoglycemic drugs: Secondary | ICD-10-CM | POA: Insufficient documentation

## 2017-10-30 DIAGNOSIS — Z7982 Long term (current) use of aspirin: Secondary | ICD-10-CM | POA: Insufficient documentation

## 2017-10-30 DIAGNOSIS — Z955 Presence of coronary angioplasty implant and graft: Secondary | ICD-10-CM | POA: Insufficient documentation

## 2017-10-30 DIAGNOSIS — E782 Mixed hyperlipidemia: Secondary | ICD-10-CM | POA: Diagnosis not present

## 2017-10-30 DIAGNOSIS — I129 Hypertensive chronic kidney disease with stage 1 through stage 4 chronic kidney disease, or unspecified chronic kidney disease: Secondary | ICD-10-CM | POA: Diagnosis not present

## 2017-10-30 DIAGNOSIS — D176 Benign lipomatous neoplasm of spermatic cord: Secondary | ICD-10-CM | POA: Diagnosis not present

## 2017-10-30 HISTORY — DX: Type 2 diabetes mellitus without complications: E11.9

## 2017-10-30 HISTORY — DX: Obstructive sleep apnea (adult) (pediatric): G47.33

## 2017-10-30 HISTORY — DX: Nocturia: R35.1

## 2017-10-30 HISTORY — PX: INGUINAL HERNIA REPAIR: SHX194

## 2017-10-30 HISTORY — DX: Anemia in chronic kidney disease: D63.1

## 2017-10-30 HISTORY — DX: Bilateral inguinal hernia, without obstruction or gangrene, not specified as recurrent: K40.20

## 2017-10-30 HISTORY — DX: Peripheral vascular disease, unspecified: I73.9

## 2017-10-30 HISTORY — DX: Secondary hyperparathyroidism of renal origin: N25.81

## 2017-10-30 HISTORY — DX: Presence of spectacles and contact lenses: Z97.3

## 2017-10-30 HISTORY — PX: INSERTION OF MESH: SHX5868

## 2017-10-30 HISTORY — DX: Dependence on other enabling machines and devices: Z99.89

## 2017-10-30 HISTORY — DX: Urgency of urination: R39.15

## 2017-10-30 HISTORY — DX: Unspecified osteoarthritis, unspecified site: M19.90

## 2017-10-30 HISTORY — DX: Anemia in chronic kidney disease: N18.9

## 2017-10-30 LAB — GLUCOSE, CAPILLARY: Glucose-Capillary: 79 mg/dL (ref 70–99)

## 2017-10-30 SURGERY — REPAIR, HERNIA, INGUINAL, BILATERAL, LAPAROSCOPIC
Anesthesia: General | Site: Abdomen | Laterality: Bilateral

## 2017-10-30 MED ORDER — OXYCODONE HCL 5 MG PO TABS
5.0000 mg | ORAL_TABLET | Freq: Once | ORAL | Status: DC | PRN
  Filled 2017-10-30: qty 1

## 2017-10-30 MED ORDER — SODIUM CHLORIDE 0.9 % IV SOLN
INTRAVENOUS | Status: DC
  Administered 2017-10-30: 09:00:00 via INTRAVENOUS
  Filled 2017-10-30: qty 1000

## 2017-10-30 MED ORDER — GABAPENTIN 300 MG PO CAPS
ORAL_CAPSULE | ORAL | Status: AC
  Filled 2017-10-30: qty 1

## 2017-10-30 MED ORDER — EPHEDRINE 5 MG/ML INJ
INTRAVENOUS | Status: AC
  Filled 2017-10-30: qty 10

## 2017-10-30 MED ORDER — GABAPENTIN 300 MG PO CAPS
300.0000 mg | ORAL_CAPSULE | ORAL | Status: AC
  Administered 2017-10-30: 300 mg via ORAL
  Filled 2017-10-30: qty 1

## 2017-10-30 MED ORDER — ARTIFICIAL TEARS OPHTHALMIC OINT
TOPICAL_OINTMENT | OPHTHALMIC | Status: AC
  Filled 2017-10-30: qty 3.5

## 2017-10-30 MED ORDER — LIDOCAINE 2% (20 MG/ML) 5 ML SYRINGE
INTRAMUSCULAR | Status: DC | PRN
  Administered 2017-10-30: 100 mg via INTRAVENOUS

## 2017-10-30 MED ORDER — CHLORHEXIDINE GLUCONATE CLOTH 2 % EX PADS
6.0000 | MEDICATED_PAD | Freq: Once | CUTANEOUS | Status: DC
  Filled 2017-10-30: qty 6

## 2017-10-30 MED ORDER — ROCURONIUM BROMIDE 10 MG/ML (PF) SYRINGE
PREFILLED_SYRINGE | INTRAVENOUS | Status: DC | PRN
  Administered 2017-10-30: 10 mg via INTRAVENOUS
  Administered 2017-10-30: 50 mg via INTRAVENOUS

## 2017-10-30 MED ORDER — ONDANSETRON HCL 4 MG/2ML IJ SOLN
INTRAMUSCULAR | Status: AC
  Filled 2017-10-30: qty 2

## 2017-10-30 MED ORDER — ONDANSETRON HCL 4 MG/2ML IJ SOLN
4.0000 mg | Freq: Once | INTRAMUSCULAR | Status: DC | PRN
  Filled 2017-10-30: qty 2

## 2017-10-30 MED ORDER — SUGAMMADEX SODIUM 200 MG/2ML IV SOLN
INTRAVENOUS | Status: AC
  Filled 2017-10-30: qty 2

## 2017-10-30 MED ORDER — ACETAMINOPHEN 500 MG PO TABS
1000.0000 mg | ORAL_TABLET | ORAL | Status: AC
  Administered 2017-10-30: 1000 mg via ORAL
  Filled 2017-10-30: qty 2

## 2017-10-30 MED ORDER — CHLORHEXIDINE GLUCONATE CLOTH 2 % EX PADS
6.0000 | MEDICATED_PAD | Freq: Once | CUTANEOUS | Status: AC
  Administered 2017-10-30: 6 via TOPICAL
  Filled 2017-10-30: qty 6

## 2017-10-30 MED ORDER — TRAMADOL HCL 50 MG PO TABS: 50.0000 mg | ORAL_TABLET | Freq: Four times a day (QID) | ORAL | 0 refills | Status: DC | PRN

## 2017-10-30 MED ORDER — LIDOCAINE 2% (20 MG/ML) 5 ML SYRINGE
INTRAMUSCULAR | Status: DC | PRN
  Administered 2017-10-30: 1.5 mg/kg/h via INTRAVENOUS

## 2017-10-30 MED ORDER — LIDOCAINE 2% (20 MG/ML) 5 ML SYRINGE
INTRAMUSCULAR | Status: AC
  Filled 2017-10-30: qty 5

## 2017-10-30 MED ORDER — CEFAZOLIN SODIUM-DEXTROSE 2-4 GM/100ML-% IV SOLN
INTRAVENOUS | Status: AC
  Filled 2017-10-30: qty 100

## 2017-10-30 MED ORDER — ONDANSETRON HCL 4 MG/2ML IJ SOLN
INTRAMUSCULAR | Status: DC | PRN
  Administered 2017-10-30: 4 mg via INTRAVENOUS

## 2017-10-30 MED ORDER — DEXAMETHASONE SODIUM PHOSPHATE 10 MG/ML IJ SOLN
INTRAMUSCULAR | Status: DC | PRN
  Administered 2017-10-30: 8 mg via INTRAVENOUS

## 2017-10-30 MED ORDER — KETAMINE HCL 10 MG/ML IJ SOLN
INTRAMUSCULAR | Status: DC | PRN
  Administered 2017-10-30: 20 mg via INTRAVENOUS

## 2017-10-30 MED ORDER — ACETAMINOPHEN 500 MG PO TABS
ORAL_TABLET | ORAL | Status: AC
  Filled 2017-10-30: qty 2

## 2017-10-30 MED ORDER — OXYCODONE HCL 5 MG/5ML PO SOLN
5.0000 mg | Freq: Once | ORAL | Status: DC | PRN
  Filled 2017-10-30: qty 5

## 2017-10-30 MED ORDER — MEPERIDINE HCL 25 MG/ML IJ SOLN
6.2500 mg | INTRAMUSCULAR | Status: DC | PRN
  Filled 2017-10-30: qty 1

## 2017-10-30 MED ORDER — DEXAMETHASONE SODIUM PHOSPHATE 10 MG/ML IJ SOLN
INTRAMUSCULAR | Status: AC
  Filled 2017-10-30: qty 1

## 2017-10-30 MED ORDER — ROCURONIUM BROMIDE 100 MG/10ML IV SOLN
INTRAVENOUS | Status: AC
Start: 2017-10-30 — End: ?
  Filled 2017-10-30: qty 1

## 2017-10-30 MED ORDER — SUGAMMADEX SODIUM 200 MG/2ML IV SOLN
INTRAVENOUS | Status: DC | PRN
  Administered 2017-10-30: 250 mg via INTRAVENOUS

## 2017-10-30 MED ORDER — FENTANYL CITRATE (PF) 100 MCG/2ML IJ SOLN
INTRAMUSCULAR | Status: DC | PRN
  Administered 2017-10-30 (×3): 50 ug via INTRAVENOUS

## 2017-10-30 MED ORDER — EPHEDRINE SULFATE-NACL 50-0.9 MG/10ML-% IV SOSY
PREFILLED_SYRINGE | INTRAVENOUS | Status: DC | PRN
  Administered 2017-10-30: 10 mg via INTRAVENOUS

## 2017-10-30 MED ORDER — BUPIVACAINE-EPINEPHRINE 0.25% -1:200000 IJ SOLN
INTRAMUSCULAR | Status: DC | PRN
  Administered 2017-10-30: 60 mL

## 2017-10-30 MED ORDER — PROPOFOL 10 MG/ML IV BOLUS
INTRAVENOUS | Status: DC | PRN
  Administered 2017-10-30: 120 mg via INTRAVENOUS

## 2017-10-30 MED ORDER — KETAMINE HCL 10 MG/ML IJ SOLN
INTRAMUSCULAR | Status: AC
  Filled 2017-10-30: qty 1

## 2017-10-30 MED ORDER — GLYCOPYRROLATE PF 0.2 MG/ML IJ SOSY
PREFILLED_SYRINGE | INTRAMUSCULAR | Status: AC
  Filled 2017-10-30: qty 1

## 2017-10-30 MED ORDER — FENTANYL CITRATE (PF) 250 MCG/5ML IJ SOLN
INTRAMUSCULAR | Status: AC
  Filled 2017-10-30: qty 5

## 2017-10-30 MED ORDER — CEFAZOLIN SODIUM-DEXTROSE 2-4 GM/100ML-% IV SOLN
2.0000 g | INTRAVENOUS | Status: AC
  Administered 2017-10-30: 2 g via INTRAVENOUS
  Filled 2017-10-30: qty 100

## 2017-10-30 MED ORDER — PROPOFOL 10 MG/ML IV BOLUS
INTRAVENOUS | Status: AC
  Filled 2017-10-30: qty 20

## 2017-10-30 MED ORDER — FENTANYL CITRATE (PF) 100 MCG/2ML IJ SOLN
25.0000 ug | INTRAMUSCULAR | Status: DC | PRN
  Filled 2017-10-30: qty 1

## 2017-10-30 SURGICAL SUPPLY — 44 items
CABLE HIGH FREQUENCY MONO STRZ (ELECTRODE) ×2 IMPLANT
CANISTER SUCT 3000ML PPV (MISCELLANEOUS) IMPLANT
CHLORAPREP W/TINT 26ML (MISCELLANEOUS) ×2 IMPLANT
DECANTER SPIKE VIAL GLASS SM (MISCELLANEOUS) ×2 IMPLANT
DEVICE SECURE STRAP 25 ABSORB (INSTRUMENTS) ×2 IMPLANT
DRAPE LAPAROSCOPIC ABDOMINAL (DRAPES) ×2 IMPLANT
DRAPE WARM FLUID 44X44 (DRAPE) ×2 IMPLANT
DRSG TEGADERM 2-3/8X2-3/4 SM (GAUZE/BANDAGES/DRESSINGS) ×2 IMPLANT
DRSG TEGADERM 4X4.75 (GAUZE/BANDAGES/DRESSINGS) ×2 IMPLANT
ELECT REM PT RETURN 9FT ADLT (ELECTROSURGICAL) ×2
ELECTRODE REM PT RTRN 9FT ADLT (ELECTROSURGICAL) ×1 IMPLANT
GLOVE BIO SURGEON STRL SZ 6.5 (GLOVE) ×2 IMPLANT
GLOVE BIO SURGEON STRL SZ7.5 (GLOVE) ×2 IMPLANT
GLOVE BIOGEL PI IND STRL 6.5 (GLOVE) ×1 IMPLANT
GLOVE BIOGEL PI IND STRL 7.5 (GLOVE) ×2 IMPLANT
GLOVE BIOGEL PI INDICATOR 6.5 (GLOVE) ×1
GLOVE BIOGEL PI INDICATOR 7.5 (GLOVE) ×2
GLOVE ECLIPSE 8.0 STRL XLNG CF (GLOVE) ×2 IMPLANT
GLOVE INDICATOR 8.0 STRL GRN (GLOVE) ×2 IMPLANT
GOWN STRL REUS W/TWL XL LVL3 (GOWN DISPOSABLE) ×6 IMPLANT
IRRIG SUCT STRYKERFLOW 2 WTIP (MISCELLANEOUS)
IRRIGATION SUCT STRKRFLW 2 WTP (MISCELLANEOUS) IMPLANT
KIT TURNOVER CYSTO (KITS) ×2 IMPLANT
MANIFOLD NEPTUNE II (INSTRUMENTS) ×2 IMPLANT
MESH HERNIA 6X6 BARD (Mesh General) ×2 IMPLANT
MESH HERNIA BARD 6X6 (Mesh General) ×2 IMPLANT
NEEDLE HYPO 22GX1.5 SAFETY (NEEDLE) ×2 IMPLANT
NS IRRIG 500ML POUR BTL (IV SOLUTION) ×2 IMPLANT
PACK BASIN DAY SURGERY FS (CUSTOM PROCEDURE TRAY) ×2 IMPLANT
PAD POSITIONING PINK XL (MISCELLANEOUS) ×2 IMPLANT
SCISSORS LAP 5X35 DISP (ENDOMECHANICALS) ×2 IMPLANT
SLEEVE ADV FIXATION 5X100MM (TROCAR) ×2 IMPLANT
SPONGE GAUZE 2X2 8PLY STRL LF (GAUZE/BANDAGES/DRESSINGS) ×2 IMPLANT
SUT MNCRL AB 4-0 PS2 18 (SUTURE) ×2 IMPLANT
SUT VIC AB 2-0 SH 27 (SUTURE)
SUT VIC AB 2-0 SH 27XBRD (SUTURE) IMPLANT
SUT VICRYL 0 UR6 27IN ABS (SUTURE) ×2 IMPLANT
TOWEL OR 17X24 6PK STRL BLUE (TOWEL DISPOSABLE) ×4 IMPLANT
TRAY DSU PREP LF (CUSTOM PROCEDURE TRAY) ×2 IMPLANT
TRAY LAPAROSCOPIC (CUSTOM PROCEDURE TRAY) ×4 IMPLANT
TROCAR ADV FIXATION 5X100MM (TROCAR) ×2 IMPLANT
TROCAR XCEL BLUNT TIP 100MML (ENDOMECHANICALS) ×2 IMPLANT
TUBING INSUF HEATED (TUBING) ×2 IMPLANT
WATER STERILE IRR 500ML POUR (IV SOLUTION) ×2 IMPLANT

## 2017-10-30 NOTE — Anesthesia Procedure Notes (Signed)
Procedure Name: Intubation Date/Time: 10/30/2017 9:36 AM Performed by: Nolon Nations, MD Pre-anesthesia Checklist: Patient identified, Emergency Drugs available, Suction available and Patient being monitored Patient Re-evaluated:Patient Re-evaluated prior to induction Oxygen Delivery Method: Circle system utilized Preoxygenation: Pre-oxygenation with 100% oxygen Induction Type: IV induction Ventilation: Mask ventilation without difficulty Laryngoscope Size: Mac and 4 Grade View: Grade I Tube type: Oral Tube size: 8.0 mm Number of attempts: 1 Airway Equipment and Method: Stylet and Oral airway Placement Confirmation: ETT inserted through vocal cords under direct vision,  positive ETCO2 and breath sounds checked- equal and bilateral Secured at: 22 cm Tube secured with: Tape Dental Injury: Teeth and Oropharynx as per pre-operative assessment

## 2017-10-30 NOTE — Discharge Instructions (Signed)
HERNIA REPAIR: POST OP INSTRUCTIONS ° °###################################################################### ° °EAT °Gradually transition to a high fiber diet with a fiber supplement over the next few weeks after discharge.  Start with a pureed / full liquid diet (see below) ° °WALK °Walk an hour a day.  Control your pain to do that.   ° °CONTROL PAIN °Control pain so that you can walk, sleep, tolerate sneezing/coughing, and go up/down stairs. ° °HAVE A BOWEL MOVEMENT DAILY °Keep your bowels regular to avoid problems.  OK to try a laxative to override constipation.  OK to use an antidairrheal to slow down diarrhea.  Call if not better after 2 tries ° °CALL IF YOU HAVE PROBLEMS/CONCERNS °Call if you are still struggling despite following these instructions. °Call if you have concerns not answered by these instructions ° °###################################################################### ° ° ° °1. DIET: Follow a light bland diet the first 24 hours after arrival home, such as soup, liquids, crackers, etc.  Be sure to include lots of fluids daily.  Advance to a low fat / high fiber diet over the next few days after surgery.  Avoid fast food or heavy meals the first week as your are more likely to get nauseated.   ° °2. Take your usually prescribed home medications unless otherwise directed. ° °3. PAIN CONTROL: °a. Pain is best controlled by a usual combination of three different methods TOGETHER: °i. Ice/Heat °ii. Over the counter pain medication °iii. Prescription pain medication °b. Most patients will experience some swelling and bruising around the hernia(s) such as the bellybutton, groins, or old incisions.  Ice packs or heating pads (30-60 minutes up to 6 times a day) will help. Use ice for the first few days to help decrease swelling and bruising, then switch to heat to help relax tight/sore spots and speed recovery.  Some people prefer to use ice alone, heat alone, alternating between ice & heat.  Experiment  to what works for you.  Swelling and bruising can take several weeks to resolve.   °c. It is helpful to take an over-the-counter pain medication regularly for the first few weeks.  Choose one of the following that works best for you: °i. Naproxen (Aleve, etc)  Two 220mg tabs twice a day °ii. Ibuprofen (Advil, etc) Three 200mg tabs four times a day (every meal & bedtime) °iii. Acetaminophen (Tylenol, etc) 325-650mg four times a day (every meal & bedtime) °d. A  prescription for pain medication should be given to you upon discharge.  Take your pain medication as prescribed.  °i. If you are having problems/concerns with the prescription medicine (does not control pain, nausea, vomiting, rash, itching, etc), please call us [phone] to see if we need to switch you to a different pain medicine that will work better for you and/or control your side effect better. °ii. If you need a refill on your pain medication, please contact your pharmacy.  They will contact our office to request authorization. Prescriptions will not be filled after 5 pm or on week-ends. ° °4. Avoid getting constipated.  Between the surgery and the pain medications, it is common to experience some constipation.  Increasing fluid intake and taking a fiber supplement (such as Metamucil, Citrucel, FiberCon, MiraLax, etc) 1-2 times a day regularly will usually help prevent this problem from occurring.  A mild laxative (prune juice, Milk of Magnesia, MiraLax, etc) should be taken according to package directions if there are no bowel movements after 48 hours.   ° °5. Wash / shower every   day.  You may shower over the dressings as they are waterproof.    6. Remove your waterproof bandages, skin tapes, and other bandages 5 days after surgery. You may replace a dressing/Band-Aid to cover the incision for comfort if you wish. You may leave the incisions open to air.  You may replace a dressing/Band-Aid to cover an incision for comfort if you wish.   Continue to shower over incision(s) after the dressing is off.  7. ACTIVITIES as tolerated:   a. You may resume regular (light) daily activities beginning the next day--such as daily self-care, walking, climbing stairs--gradually increasing activities as tolerated.  Control your pain so that you can walk an hour a day.  If you can walk 30 minutes without difficulty, it is safe to try more intense activity such as jogging, treadmill, bicycling, low-impact aerobics, swimming, etc. b. Save the most intensive and strenuous activity for last such as sit-ups, heavy lifting, contact sports, etc  Refrain from any heavy lifting or straining until you are off narcotics for pain control.   c. DO NOT PUSH THROUGH PAIN.  Let pain be your guide: If it hurts to do something, don't do it.  Pain is your body warning you to avoid that activity for another week until the pain goes down. d. You may drive when you are no longer taking prescription pain medication, you can comfortably wear a seatbelt, and you can safely maneuver your car and apply brakes. e. Dennis Bast may have sexual intercourse when it is comfortable.   8. FOLLOW UP in our office a. Please call [facility] at [phone] to set up an appointment to see your surgeon in the office for a follow-up appointment approximately 2-3 weeks after your surgery. b. Make sure that you call for this appointment the day you arrive home to insure a convenient appointment time.  9.  If you have disability of FMLA / Family leave forms, please bring the forms to the office for processing.  (do not give to your surgeon).  WHEN TO CALL us [phone]: 1. Poor pain control 2. Reactions / problems with new medications (rash/itching, nausea, etc)  3. Fever over 101.5 F (38.5 C) 4. Inability to urinate 5. Nausea and/or vomiting 6. Worsening swelling or bruising 7. Continued bleeding from incision. 8. Increased pain, redness, or drainage from the incision   The clinic staff is  available to answer your questions during regular business hours (8:30am-5pm).  Please dont hesitate to call and ask to speak to one of our nurses for clinical concerns.   If you have a medical emergency, go to the nearest emergency room or call 911.  A surgeon from [facility] is always on call at the hospitals in [facility], [place], [place], [place],   [address] ?  [address] [address], [place],    [address] MAIN: [phone] ? TOLL FREE: [phone] ? FAX: [phone] [email or web address]   Inguinal Hernia, Adult An inguinal hernia is when fat or the intestines push through the area where the leg meets the lower abdomen (groin) and create a rounded lump (bulge). This condition develops over time. There are three types of inguinal hernias. These types include:  Hernias that can be pushed back into the belly (are reducible).  Hernias that are not reducible (are incarcerated).  Hernias that are not reducible and lose their blood supply (are strangulated). This type of hernia requires emergency surgery.  What are the causes?  This condition is caused by having a weak spot in the muscles or tissue. This weakness lets the hernia poke through. This condition can be triggered by:  Suddenly straining the muscles of the lower abdomen.  Lifting heavy objects.  Straining to have a bowel movement. Difficult bowel movements (constipation) can lead to this.  Coughing.  What increases the risk? This condition is more likely to develop in:  Men.  Pregnant women.  People who: ? Are overweight. ? Work in jobs that require long periods of standing or heavy lifting. ? Have had an inguinal hernia before. ? Smoke or have lung disease. These factors can lead to long-lasting (chronic) coughing.  What are the signs or symptoms? Symptoms can depend on the size of the hernia. Often, a small inguinal hernia has no  symptoms. Symptoms of a larger hernia include:  A lump in the groin. This is easier to see when the person is standing. It might not be visible when he or she is lying down.  Pain or burning in the groin. This occurs especially when lifting, straining, or coughing.  A dull ache or a feeling of pressure in the groin.  A lump in the scrotum in men.  Symptoms of a strangulated inguinal hernia can include:  A bulge in the groin that is very painful and tender to the touch.  A bulge that turns red or purple.  Fever, nausea, and vomiting.  The inability to have a bowel movement or to pass gas.  How is this diagnosed? This condition is diagnosed with a medical history and physical exam. Your health care provider may feel your groin area and ask you to cough. How is this treated? Treatment for this condition varies depending on the size of your hernia and whether you have symptoms. If you do not have symptoms, your health care provider may have you watch your hernia carefully and come in for follow-up visits. If your hernia is larger or if you have symptoms, your treatment will include surgery. Follow these instructions at home: Lifestyle  Drink enough fluid to keep your urine clear or pale yellow.  Eat a diet that includes a lot of fiber. Eat plenty of fruits, vegetables, and whole grains. Talk with your health care provider if you have questions.  Avoid lifting heavy objects.  Avoid standing for long periods of time.  Do not use tobacco products, including cigarettes, chewing tobacco, or e-cigarettes. If you need help quitting, ask your health care provider.  Maintain a healthy weight. General instructions  Do not try to force the hernia back in.  Watch your hernia for any changes in color or size. Let your health care provider know if any changes occur.  Take over-the-counter and prescription medicines only as told by your health care provider.  Keep all follow-up visits as  told by your health care provider. This is important. Contact a health care provider if:  You have a fever.  You have new symptoms.  Your symptoms get worse. Get help right away if:  You have pain in the groin that suddenly gets worse.  A bulge in the groin gets bigger suddenly and does not go down.  You are a man and you have a sudden pain in the scrotum, or the size of your scrotum suddenly changes.  A bulge in the groin area becomes red or purple and is painful to the touch.  You have nausea or vomiting that does not go away.  You feel  your heart beating a lot more quickly than normal.  You cannot have a bowel movement or pass gas. This information is not intended to replace advice given to you by your health care provider. Make sure you discuss any questions you have with your health care provider. Document Released: 08/17/2008 Document Revised: 09/06/2015 Document Reviewed: 02/08/2014 Elsevier Interactive Patient Education  2018 [place] Instructions  Activity: Get plenty of rest for the remainder of the day. A responsible individual must stay with you for 24 hours following the procedure.  For the next 24 hours, DO NOT: -Drive a car -Paediatric nurse -Drink alcoholic beverages -Take any medication unless instructed by your physician -Make any legal decisions or sign important papers.  Meals: Start with liquid foods such as gelatin or soup. Progress to regular foods as tolerated. Avoid greasy, spicy, heavy foods. If nausea and/or vomiting occur, drink only clear liquids until the nausea and/or vomiting subsides. Call your physician if vomiting continues.  Special Instructions/Symptoms: Your throat may feel dry or sore from the anesthesia or the breathing tube placed in your throat during surgery. If this causes discomfort, gargle with warm salt water. The discomfort should disappear within 24 hours.

## 2017-10-30 NOTE — Op Note (Signed)
10/30/2017  11:15 AM  PATIENT:  Kevin Schwartz  84 y.o. male  Patient Care Team: Gayland Curry, DO as PCP - General (Geriatric Medicine) Bensimhon, Shaune Pascal, MD as Consulting Physician (Cardiology) Penninger, Ria Comment, [place] as Physician Assistant (Nephrology) Donato Heinz, MD as Consulting Physician (Nephrology) Michael Boston, MD as Consulting Physician (General Surgery)  PRE-OPERATIVE DIAGNOSIS:  Hernias in both groins bilateral inguinal hernias  POST-OPERATIVE DIAGNOSIS:   Bilateral inguinal hernias Left femoral hernia   PROCEDURE:   LAPAROSCOPIC  BILATERAL INGUINAL HERNIA REPAIR LAPAROSCOPIC LEFT FEMERAL HERNIA REPAIR INSERTION OF MESH  SURGEON:  Adin Hector, MD  ASSISTANT: None  ANESTHESIA:     Regional ilioinguinal and genitofemoral and spermatic cord nerve blocks  General  EBL:  Total I/O In: 500 [I.V.:500] Out: 5 [Blood:5].  See anesthesia record  Delay start of Pharmacological VTE agent (>24hrs) due to surgical blood loss or risk of bleeding:  no  DRAINS: NONE  SPECIMEN:  NONE  DISPOSITION OF SPECIMEN:  N/A  COUNTS:  YES  PLAN OF CARE: Discharge to home after PACU  PATIENT DISPOSITION:  PACU - hemodynamically stable.  INDICATION: Patient with large right anal hernia and probable left inguinal hernias as well.  Recommendation made for laparoscopic exploration and repair of hernias found  The anatomy & physiology of the abdominal wall and pelvic floor was discussed.  The pathophysiology of hernias in the inguinal and pelvic region was discussed.  Natural history risks such as progressive enlargement, pain, incarceration & strangulation was discussed.   Contributors to complications such as smoking, obesity, diabetes, prior surgery, etc were discussed.    I feel the risks of no intervention will lead to serious problems that outweigh the operative risks; therefore, I recommended surgery to reduce and repair the hernia.  I explained  laparoscopic techniques with possible need for an open approach.  I noted usual use of mesh to patch and/or buttress hernia repair  Risks such as bleeding, infection, abscess, need for further treatment, heart attack, death, and other risks were discussed.  I noted a good likelihood this will help address the problem.   Goals of post-operative recovery were discussed as well.  Possibility that this will not correct all symptoms was explained.  I stressed the importance of low-impact activity, aggressive pain control, avoiding constipation, & not pushing through pain to minimize risk of post-operative chronic pain or injury. Possibility of reherniation was discussed.  We will work to minimize complications.     An educational handout further explaining the pathology & treatment options was given as well.  Questions were answered.  The patient expresses understanding & wishes to proceed with surgery.  OR FINDINGS: Right indirect greater than indirect inguinal hernias.  Mild laxity at the femoral canal but no true hernia.  No obturator hernia.  On the left side, indirect inguinal hernia and femoral hernia.  No direct space hernia.  No obturator hernia.  DESCRIPTION:  The patient was identified & brought into the operating room. The patient was positioned supine with arms tucked. SCDs were active during the entire case. The patient underwent general anesthesia without any difficulty.  The abdomen was prepped and draped in a sterile fashion. The patient's bladder was emptied.  A Surgical Timeout confirmed our plan.  I made a transverse incision through the inferior umbilical fold.  I made a small transverse nick through the anterior rectus fascia contralateral to the inguinal hernia side and placed a 0-vicryl stitch through the fascia.  I placed  a Hasson trocar into the preperitoneal plane.  Entry was clean.  We induced carbon dioxide insufflation. Camera inspection revealed no injury.  I used a 91mm angled  scope to bluntly free the peritoneum off the infraumbilical anterior abdominal wall.  I created enough of a preperitoneal pocket to place 44mm ports into the right & left mid-abdomen into this preperitoneal cavity.  I focused attention on the RIGHT pelvis since that was the dominant hernia side.   I used blunt & focused sharp dissection to free the peritoneum off the flank and down to the pubic rim.  I freed the anteriolateral bladder wall off the anteriolateral pelvic wall, sparing midline attachments.   I located a swath of peritoneum going into a hernia fascial defect at the  direct space consistent with  a direct space inguinal hernia.  Laxity at the internal ring consistent with an indirect inguinal hernia as well.  I gradually freed the peritoneal hernia sac off safely and reduced it into the preperitoneal space.  I freed the peritoneum off the spermatic vessels & vas deferens.  I freed peritoneum off the retroperitoneum along the psoas muscle.  Spermatic cord lipoma was dissected away & removed.  I checked & assured hemostasis.     I turned attention on the opposite  LEFT pelvis.  I did dissection in a similar, mirror-image fashion. The patient had an indirect inguinal hernia. Spermatic cord lipoma was dissected away & removed.  Obvious femoral hernia as well.   I checked & assured hemostasis.     Has had moderate size defects, one direct the largest, I chose 15x15 cm sheets of medium weight Bard polypropylene mesh, one for each side.  I cut a single sigmoid-shaped slit ~6cm from a corner of each mesh.  I placed the meshes into the preperitoneal space & laid them as overlapping diamonds such that at the inferior points, a 6x6 cm corner flap rested in the true anterolateral pelvis, covering the obturator & femoral foramina.   I allowed the bladder to return to the pubis, this helping tuck the corners of the mesh in the anteriolateral pelvis.  The medial corners overlapped each other across midline  cephalad to the pubic rim.  The wound pelvis and shortened abdomen, so I did use some absorbable secure strap tacks to help laid the mesh more flat against abdominal wall and provide good coverage.  Did this on the upper outer corners as well as along the midline on the rectus muscle and just above the pubis.  Allow the mesh to lay well and overlap well.  I did not feel the need for a third mesh  I held the hernia sacs cephalad & evacuated carbon dioxide.  I closed the fascia with absorbable suture.  I closed the skin using 4-0 monocryl stitch.  Sterile dressings were applied.   The patient was extubated & arrived in the PACU in stable condition..  I had discussed postoperative care with the patient in the holding area.  Instructions are written in the chart.  I discussed operative findings, updated the patient's status, discussed probable steps to recovery, and gave postoperative recommendations to the patient's spouse.  Recommendations were made.  Questions were answered.  She expressed understanding & appreciation.   Adin Hector, M.D., F.A.C.S. Gastrointestinal and Minimally Invasive Surgery [facility] [address], [place], [place] [address] [phone] Main / Paging  10/30/2017 11:15 AM

## 2017-10-30 NOTE — Transfer of Care (Signed)
  Last Vitals:  Vitals Value Taken Time  BP 143/68 10/30/2017 11:30 AM  Temp    Pulse    Resp 17 10/30/2017 11:31 AM  SpO2    Vitals shown include unvalidated device data.  Last Pain:  Vitals:   10/30/17 0829  TempSrc:   PainSc: 0-No pain         Immediate Anesthesia Transfer of Care Note  Patient: Kevin Schwartz  Procedure(s) Performed: Procedure(s) (LRB): LAPAROSCOPIC  BILATERAL INGUINAL HERNIA REPAIR  AND LEFT FEMERAL HERNIA REPAIR (Bilateral) INSERTION OF MESH (Bilateral)  Patient Location: PACU  Anesthesia Type: General  Level of Consciousness: awake, alert  and oriented  Airway & Oxygen Therapy: Patient Spontanous Breathing and Patient connected to nasal cannula oxygen  Post-op Assessment: Report given to PACU RN and Post -op Vital signs reviewed and stable  Post vital signs: Reviewed and stable  Complications: No apparent anesthesia complications

## 2017-10-30 NOTE — Interval H&P Note (Signed)
History and Physical Interval Note:  10/30/2017 9:03 AM  Kevin Schwartz  has presented today for surgery, with the diagnosis of Hernias in both groins bilateral inguinal hernias  The various methods of treatment have been discussed with the patient and family. After consideration of risks, benefits and other options for treatment, the patient has consented to  Procedure(s): [place] (Bilateral) INSERTION OF MESH (Bilateral) as a surgical intervention .  The patient's history has been reviewed, patient examined, no change in status, stable for surgery.  I have reviewed the patient's chart and labs.  Questions were answered to the patient's satisfaction.    I have re-reviewed the the patient's records, history, medications, and allergies.  I have re-examined the patient.  I again discussed intraoperative plans and goals of post-operative recovery.  The patient agrees to proceed.  Kevin Schwartz  1931/04/06 [id number]  Patient Care Team: Gayland Curry, DO as PCP - General (Geriatric Medicine) Haroldine Laws, Shaune Pascal, MD as Consulting Physician (Cardiology) Penninger, Ria Comment, [place] as Physician Assistant (Nephrology) Donato Heinz, MD as Consulting Physician (Nephrology) Michael Boston, MD as Consulting Physician (General Surgery)  Patient Active Problem List   Diagnosis Date Noted  . Loss of weight 01/21/2016  . Heme positive stool 01/21/2016  . Heart murmur 08/18/2013  . Chronic kidney disease, stage III (moderate) ([place]) 07/14/2013  . Hyperlipidemia LDL goal < 100 07/14/2013  . Essential hypertension, benign 07/14/2013  . Type II or unspecified type diabetes mellitus with renal manifestations, not stated as uncontrolled(250.40) 07/14/2013  . Insomnia 07/14/2013  . Angioedema of lips 12/26/2012  . MGUS (monoclonal gammopathy of unknown significance) 08/01/2011  . Gout 08/01/2011  . S/P coronary artery stent  placement 08/01/2011  . DM type 2 causing CKD stage 2 ([place]) 08/01/2011  . Bradycardia 10/15/2010  . Mixed hyperlipidemia due to type 2 diabetes mellitus ([place]) 05/01/2010  . HYPERTENSION, UNSPECIFIED 05/01/2010  . CAD, NATIVE VESSEL 05/01/2010  . PVD 05/01/2010    Past Medical History:  Diagnosis Date  . Anemia due to chronic kidney disease   . Bilateral inguinal hernia   . CAD (coronary artery disease) cardiologist-  dr bensimhon   PTCA of OM in 1998, [place] OM in 2000, Cath 9/07 LM ok LAD ok. LCX 95% in OM prior to previous stent RCA. nondominant normal EF normal. Cypher DES to OM 2007 (PLACED PROXIAMAL TO PREVIOUS STENT)  . Chronic kidney disease, stage III (moderate) [facility])    nephrologist-  dr detarding  . Depression   . Diabetes mellitus type 2, diet-controlled ([place])    followed by pcp,  last A1c 5.2 on 09-10-2017 in epic  . Gout, unspecified    10-29-2017  per pt stable  . HLD (hyperlipidemia)   . HTN (hypertension)   . MGUS (monoclonal gammopathy of unknown significance) previously followed by dr Beryle Beams , lov and released 08/01/2011   IgG kappa dx 2002 8% plasma cells in bone marrow; no lesions on bone X-rays;  . Nocturia   . OA (osteoarthritis)   . OSA on CPAP    per study 01-30-2004  severe osa , AHI 51.6/hr  . PAD (peripheral artery disease) ([place])    05-21-2006  left renal artery stenosis, s/p balloon angioplasty and stenting;  last duplex 02/ 2012  normal , arteries patent  . S/P coronary artery stent placement    2000--  BMS x1  to OM;   2007-- DES x1  to OM proximal to  previous stent  . Secondary hyperparathyroidism of renal origin ([place])   . Urgency of urination   . Wears glasses     Past Surgical History:  Procedure Laterality Date  . CARDIOVASCULAR STRESS TEST  2010   per dr bensimhon epic note dated 04-18-2016  normal  . [place]   PTCA to OM  . CORONARY ANGIOPLASTY WITH STENT PLACEMENT  2000   PCI and BMS x1 OM  . CORONARY ANGIOPLASTY  WITH STENT PLACEMENT  12-18-2005  dr Claiborne Billings   DES x1 to proximal to previously placed stent in OM  . RENAL ANGIOPLASTY Left 05-21-2006   dr berry   and stenting  . TOTAL KNEE ARTHROPLASTY Left 07-22-2005   dr Shellia Carwin  [facility]  . TRANSTHORACIC ECHOCARDIOGRAM  04/18/2016   ef 78-29%, grade 1 diastolic dysfunction/  mild to moderate AR/  mild MR    Social History   Socioeconomic History  . Marital status: Widowed    Spouse name: Not on file  . Number of children: 1  . Years of education: Not on file  . Highest education level: Not on file  Occupational History  . Occupation: retired   Scientific laboratory technician  . Financial resource strain: Not hard at all  . Food insecurity:    Worry: Never true    Inability: Never true  . Transportation needs:    Medical: No    Non-medical: No  Tobacco Use  . Smoking status: Former Smoker    Years: 54.00    Types: Cigarettes    Last attempt to quit: 09/29/2017    Years since quitting: 0.0  . Smokeless tobacco: Never Used  . Tobacco comment: 10-29-2017  per pt was smoking 1pp2d, stopped June 2019  Substance and Sexual Activity  . Alcohol use: Not Currently  . Drug use: No  . Sexual activity: Not on file  Lifestyle  . Physical activity:    Days per week: 3 days    Minutes per session: 20 min  . Stress: Very much  Relationships  . Social connections:    Talks on phone: More than three times a week    Gets together: More than three times a week    Attends religious service: More than 4 times per year    Active member of club or organization: No    Attends meetings of clubs or organizations: Never    Relationship status: Widowed  . Intimate partner violence:    Fear of current or ex partner: No    Emotionally abused: No    Physically abused: No    Forced sexual activity: No  Other Topics Concern  . Not on file  Social History Narrative   Widowed 02/2012   Lives along   Stopped smoking 2004   Alcohol none   Exercise works in [employer]     Family History  Problem Relation Age of Onset  . Heart disease Mother   . Heart disease Sister   . Heart disease Sister   . Cancer Sister        type unknown  . Hypertension Sister   . Diabetes Brother   . Diabetes Brother   . Diabetes Unknown   . Coronary artery disease Unknown   . Cancer Unknown     Medications Prior to Admission  Medication Sig Dispense Refill Last Dose  . ACCU-CHEK SOFTCLIX LANCETS lancets Use to test blood sugar daily. Dx:E11.8 300 each 3 10/29/2017  at Unknown time  . Alcohol Swabs (B-D SINGLE USE SWABS REGULAR) PADS Use with testing of blood sugar. Dx:E11.8 300 each 3 10/29/2017 at Unknown time  . allopurinol (ZYLOPRIM) 100 MG tablet Take 1 tablet by mouth daily. (Patient taking differently: Take 1 tablet by mouth daily.--- takes in am) 90 tablet 1 10/29/2017 at Unknown time  . amLODipine (NORVASC) 2.5 MG tablet Take 2 tablets by mouth daily. (Patient taking differently: Take 2 tablets by mouth daily.--- takes in am) 180 tablet 1 10/29/2017 at Unknown time  . aspirin 81 MG tablet Take 1 tablet (81 mg total) by mouth daily. (Patient taking differently: Take 81 mg by mouth every morning. ) 90 tablet 1 10/29/2017 at Unknown time  . atorvastatin (LIPITOR) 80 MG tablet Take 1 tablet by mouth daily for cholesterol. (Patient taking differently: Take 1 tablet by mouth daily for cholesterol.---- takes in am) 90 tablet 0 10/29/2017 at Unknown time  . citalopram (CELEXA) 20 MG tablet Take 1 tablet by mouth daily. (Patient taking differently: Take 1 tablet by mouth daily.--- takes in am) 90 tablet 0 10/29/2017 at Unknown time  . glucose blood (ACCU-CHEK AVIVA PLUS) test strip Use to check blood sugar daily. Dx:E11.8 300 each 3 10/29/2017 at Unknown time  . metoprolol tartrate (LOPRESSOR) 50 MG tablet Take 1 tablet by mouth twice daily. 180 tablet 1 10/29/2017 at 0800  . ULTRA-THIN II MINI PEN NEEDLE 31G X 5 MM MISC    10/29/2017 at Unknown time  . nitroGLYCERIN  (NITROSTAT) 0.4 MG SL tablet Place 0.4 mg under the tongue every 5 (five) minutes as needed for chest pain. Up to 3 doses   More than a month at Unknown time    Current Facility-Administered Medications  Medication Dose Route Frequency Provider Last Rate Last Dose  . 0.9 %  sodium chloride infusion   Intravenous Continuous Albertha Ghee, MD 50 mL/hr at 10/30/17 0856    . ceFAZolin (ANCEF) IVPB 2g/100 mL premix  2 g Intravenous On Call to OR Michael Boston, MD      . Chlorhexidine Gluconate Cloth 2 % PADS 6 each  6 each Topical Once Michael Boston, MD         Allergies  Allergen Reactions  . Lisinopril Swelling    angioedema  . Losartan Potassium Swelling    Angioedema   . Crestor [Rosuvastatin] Swelling    angioedema    BP 130/60   Pulse (!) 52   Temp 97.9 F (36.6 C) (Oral)   Resp 18   Ht 5\' 8"  (1.727 m)   Wt 70.4 kg (155 lb 1.6 oz)   SpO2 100%   BMI 23.58 kg/m   Labs: No results found for this or any previous visit (from the past 48 hour(s)).  Imaging / Studies: No results found.   Adin Hector, M.D., F.A.C.S. Gastrointestinal and Minimally Invasive Surgery [facility] [address] [place], [place] [address] [phone] Main / Paging  10/30/2017 9:04 AM     Adin Hector

## 2017-10-30 NOTE — Anesthesia Preprocedure Evaluation (Addendum)
Anesthesia Evaluation  Patient identified by MRN, date of birth, ID band Patient awake    Reviewed: Allergy & Precautions, NPO status , Patient's Chart, lab work & pertinent test results, reviewed documented beta blocker date and time   Airway Mallampati: II  TM Distance: >3 FB Neck ROM: Full    Dental no notable dental hx.    Pulmonary sleep apnea , former smoker,    Pulmonary exam normal breath sounds clear to auscultation       Cardiovascular hypertension, Pt. on medications and Pt. on home beta blockers + CAD, + Cardiac Stents and + Peripheral Vascular Disease  Normal cardiovascular exam+ Valvular Problems/Murmurs  Rhythm:Regular Rate:Normal  Echo 10/30/2017 - Left ventricle: The cavity size was normal. Wall thickness was normal. Systolic function was normal. The estimated ejection fraction was in the range of 50% to 55%. Wall motion was normal; there were no regional wall motion abnormalities. Doppler parameters are consistent with abnormal left ventricular relaxation (grade 1 diastolic dysfunction). - Aortic valve: There was mild to moderate regurgitation. - Mitral valve: There was mild regurgitation.   Neuro/Psych negative neurological ROS  negative psych ROS   GI/Hepatic negative GI ROS, Neg liver ROS,   Endo/Other  diabetes  Renal/GU Renal diseasenegative Renal ROS  negative genitourinary   Musculoskeletal negative musculoskeletal ROS (+) Arthritis ,   Abdominal   Peds  Hematology negative hematology ROS (+) anemia ,   Anesthesia Other Findings   Reproductive/Obstetrics negative OB ROS                            Anesthesia Physical Anesthesia Plan  ASA: III  Anesthesia Plan: General   Post-op Pain Management:    Induction: Intravenous  PONV Risk Score and Plan: 3 and Ondansetron, Dexamethasone and Treatment may vary due to age or medical condition  Airway Management Planned:  Oral ETT  Additional Equipment:   Intra-op Plan:   Post-operative Plan: Extubation in OR  Informed Consent: I have reviewed the patients History and Physical, chart, labs and discussed the procedure including the risks, benefits and alternatives for the proposed anesthesia with the patient or authorized representative who has indicated his/her understanding and acceptance.   Dental advisory given  Plan Discussed with: CRNA  Anesthesia Plan Comments:         Anesthesia Quick Evaluation

## 2017-11-02 ENCOUNTER — Encounter (HOSPITAL_BASED_OUTPATIENT_CLINIC_OR_DEPARTMENT_OTHER): Payer: Self-pay | Admitting: Surgery

## 2017-11-02 NOTE — Anesthesia Postprocedure Evaluation (Signed)
Anesthesia Post Note  Patient: Kevin Schwartz  Procedure(s) Performed: LAPAROSCOPIC  BILATERAL INGUINAL HERNIA REPAIR  AND LEFT FEMERAL HERNIA REPAIR (Bilateral Abdomen) INSERTION OF MESH (Bilateral Abdomen)     Patient location during evaluation: PACU Anesthesia Type: General Level of consciousness: sedated and patient cooperative Pain management: pain level controlled Vital Signs Assessment: post-procedure vital signs reviewed and stable Respiratory status: spontaneous breathing Cardiovascular status: stable Anesthetic complications: no    Last Vitals:  Vitals:   10/30/17 1400 10/30/17 1530  BP: 123/69 133/60  Pulse: (!) 55 (!) 56  Resp: 12 17  Temp:  36.5 C  SpO2: 93% 96%    Last Pain:  Vitals:   11/02/17 0928  TempSrc:   PainSc: [place]

## 2017-12-31 ENCOUNTER — Other Ambulatory Visit: Payer: Self-pay | Admitting: Internal Medicine

## 2018-01-25 ENCOUNTER — Ambulatory Visit (INDEPENDENT_AMBULATORY_CARE_PROVIDER_SITE_OTHER): Payer: Medicare HMO | Admitting: Internal Medicine

## 2018-01-25 ENCOUNTER — Encounter: Payer: Self-pay | Admitting: Internal Medicine

## 2018-01-25 VITALS — BP 120/70 | HR 58 | Temp 98.2°F | Ht 67.0 in | Wt 161.0 lb

## 2018-01-25 DIAGNOSIS — R634 Abnormal weight loss: Secondary | ICD-10-CM

## 2018-01-25 DIAGNOSIS — E1122 Type 2 diabetes mellitus with diabetic chronic kidney disease: Secondary | ICD-10-CM

## 2018-01-25 DIAGNOSIS — Z23 Encounter for immunization: Secondary | ICD-10-CM

## 2018-01-25 DIAGNOSIS — I2510 Atherosclerotic heart disease of native coronary artery without angina pectoris: Secondary | ICD-10-CM | POA: Diagnosis not present

## 2018-01-25 DIAGNOSIS — E1169 Type 2 diabetes mellitus with other specified complication: Secondary | ICD-10-CM | POA: Diagnosis not present

## 2018-01-25 DIAGNOSIS — E782 Mixed hyperlipidemia: Secondary | ICD-10-CM | POA: Diagnosis not present

## 2018-01-25 DIAGNOSIS — N183 Chronic kidney disease, stage 3 unspecified: Secondary | ICD-10-CM

## 2018-01-25 NOTE — Patient Instructions (Signed)
I'm so glad your weight has stabilized.  Keep up the good work with eating better.

## 2018-01-25 NOTE — Progress Notes (Signed)
Location:  [facility] clinic Provider:  Shawanda Sievert L. Mariea Clonts, D.O., C.M.D.  Goals of Care:  Advanced Directives 10/30/2017  Does Patient Have a Medical Advance Directive? No  Type of Advance Directive -  Does patient want to make changes to medical advance directive? -  Copy of [place] in Chart? -  Would patient like information on creating a medical advance directive? No - Patient declined    Chief Complaint  Patient presents with  . Medical Management of Chronic Issues    54mth follow-up    HPI: Patient is a 84 y.o. male seen today for medical management of chronic diseases.    He had a bilateral hernia s/p lap hernia repair with mesh by Dr. Johney Maine on 10/30/17.  Bowels are moving fine.  Has a little pain sometimes for the past two weeks.    Needs urine microalbumin.  Had angioedema with both ace and arb so cannot take them.    Weight is up 6 lbs since visit in July.  Says he's eating breakfast, lunch and dinner.  He is still staying with his brother. It remains a challenge. He does have some help now.    He got his flu shot here today.  Past Medical History:  Diagnosis Date  . Anemia due to chronic kidney disease   . Bilateral inguinal hernia   . CAD (coronary artery disease) cardiologist-  dr bensimhon   PTCA of OM in 1998, [place] OM in 2000, Cath 9/07 LM ok LAD ok. LCX 95% in OM prior to previous stent RCA. nondominant normal EF normal. Cypher DES to OM 2007 (PLACED PROXIAMAL TO PREVIOUS STENT)  . Chronic kidney disease, stage III (moderate) [facility])    nephrologist-  dr detarding  . Depression   . Diabetes mellitus type 2, diet-controlled ([place])    followed by pcp,  last A1c 5.2 on 09-10-2017 in epic  . Gout, unspecified    10-29-2017  per pt stable  . HLD (hyperlipidemia)   . HTN (hypertension)   . MGUS (monoclonal gammopathy of unknown significance) previously followed by dr Beryle Beams , lov and released 08/01/2011   IgG kappa dx 2002 8% plasma cells in bone  marrow; no lesions on bone X-rays;  . Nocturia   . OA (osteoarthritis)   . OSA on CPAP    per study 01-30-2004  severe osa , AHI 51.6/hr  . PAD (peripheral artery disease) ([place])    05-21-2006  left renal artery stenosis, s/p balloon angioplasty and stenting;  last duplex 02/ 2012  normal , arteries patent  . S/P coronary artery stent placement    2000--  BMS x1  to OM;   2007-- DES x1  to OM proximal to previous stent  . Secondary hyperparathyroidism of renal origin ([place])   . Urgency of urination   . Wears glasses     Past Surgical History:  Procedure Laterality Date  . CARDIOVASCULAR STRESS TEST  2010   per dr bensimhon epic note dated 04-18-2016  normal  . [place]   PTCA to OM  . CORONARY ANGIOPLASTY WITH STENT PLACEMENT  2000   PCI and BMS x1 OM  . CORONARY ANGIOPLASTY WITH STENT PLACEMENT  12-18-2005  dr Claiborne Billings   DES x1 to proximal to previously placed stent in OM  . INGUINAL HERNIA REPAIR Bilateral 10/30/2017   Procedure: LAPAROSCOPIC  BILATERAL INGUINAL HERNIA REPAIR  AND LEFT [place];  Surgeon: Michael Boston, MD;  Location: [facility];  Service: General;  Laterality: Bilateral;  . INSERTION OF MESH Bilateral 10/30/2017   Procedure: INSERTION OF MESH;  Surgeon: Michael Boston, MD;  Location: [facility];  Service: General;  Laterality: Bilateral;  . RENAL ANGIOPLASTY Left 05-21-2006   dr berry   and stenting  . TOTAL KNEE ARTHROPLASTY Left 07-22-2005   dr Shellia Carwin  [facility]  . TRANSTHORACIC ECHOCARDIOGRAM  04/18/2016   ef 99-83%, grade 1 diastolic dysfunction/  mild to moderate AR/  mild MR    Allergies  Allergen Reactions  . Lisinopril Swelling    angioedema  . Losartan Potassium Swelling    Angioedema   . Crestor [Rosuvastatin] Swelling    angioedema    Outpatient Encounter Medications as of 01/25/2018  Medication Sig  . ACCU-CHEK SOFTCLIX LANCETS lancets Use to test blood sugar daily. Dx:E11.8  .  Alcohol Swabs (B-D SINGLE USE SWABS REGULAR) PADS Use with testing of blood sugar. Dx:E11.8  . allopurinol (ZYLOPRIM) 100 MG tablet Take 1 tablet by mouth daily. (Patient taking differently: Take 1 tablet by mouth daily.--- takes in am)  . amLODipine (NORVASC) 2.5 MG tablet Take 2 tablets by mouth daily. (Patient taking differently: Take 2 tablets by mouth daily.--- takes in am)  . aspirin 81 MG tablet Take 1 tablet (81 mg total) by mouth daily. (Patient taking differently: Take 81 mg by mouth every morning. )  . atorvastatin (LIPITOR) 80 MG tablet Take 1 tablet by mouth daily for cholesterol. (Patient taking differently: Take 1 tablet by mouth daily for cholesterol.---- takes in am)  . citalopram (CELEXA) 20 MG tablet Take 1 tablet by mouth daily. (Patient taking differently: Take 1 tablet by mouth daily.--- takes in am)  . glucose blood (ACCU-CHEK AVIVA PLUS) test strip Use to check blood sugar daily. Dx:E11.8  . metoprolol tartrate (LOPRESSOR) 50 MG tablet Take 1 tablet by mouth twice daily.  . nitroGLYCERIN (NITROSTAT) 0.4 MG SL tablet Place 0.4 mg under the tongue every 5 (five) minutes as needed for chest pain. Up to 3 doses  . traMADol (ULTRAM) 50 MG tablet Take 1-2 tablets (50-100 mg total) by mouth every 6 (six) hours as needed for moderate pain or severe pain.  [name] ULTRA-THIN II MINI PEN NEEDLE 31G X 5 MM MISC    No facility-administered encounter medications on file as of 01/25/2018.     Review of Systems:  Review of Systems  Constitutional: Negative for chills, fever and weight loss.  HENT: Positive for hearing loss. Negative for congestion.   Eyes: Negative for blurred vision.       Glasses  Respiratory: Negative for cough and shortness of breath.   Cardiovascular: Negative for chest pain, palpitations and leg swelling.  Gastrointestinal: Negative for abdominal pain, blood in stool, constipation, diarrhea and melena.  Genitourinary: Negative for dysuria.  Musculoskeletal: Negative  for falls and joint pain.  Skin: Negative for itching and rash.  Neurological: Negative for dizziness and loss of consciousness.  Endo/Heme/Allergies: Does not bruise/bleed easily.  Psychiatric/Behavioral: Negative for depression and memory loss. The patient is not nervous/anxious and does not have insomnia.     Health Maintenance  Topic Date Due  . OPHTHALMOLOGY EXAM  04/14/2014  . URINE MICROALBUMIN  09/18/2017  . INFLUENZA VACCINE  11/12/2017  . HEMOGLOBIN A1C  03/13/2018  . FOOT EXAM  09/15/2018  . TETANUS/TDAP  04/14/2021  . PNA vac Low Risk Adult  Completed    Physical Exam: Vitals:   01/25/18 1054  BP: 120/70  Pulse: (!) 58  Temp: 98.2 F (36.8 C)  TempSrc: Oral  SpO2: 99%  Weight: 161 lb (73 kg)  Height: 5\' 7"  (1.702 m)   Body mass index is 25.22 kg/m. Physical Exam  Constitutional: He is oriented to person, place, and time. No distress.  Cardiovascular: Normal rate, regular rhythm, normal heart sounds and intact distal pulses.  Pulmonary/Chest: Effort normal and breath sounds normal. No respiratory distress.  Abdominal: Bowel sounds are normal.  Musculoskeletal: Normal range of motion.  Left knee gave way when he stood up--encouraged cane use  Neurological: He is alert and oriented to person, place, and time.  Skin: Skin is warm and dry.  Psychiatric: He has a normal mood and affect.    Labs reviewed: Basic Metabolic Panel: Recent Labs    05/07/17 0947 09/10/17 0947  NA 139 140  K 4.2 4.3  CL 107 108  CO2 24 25  GLUCOSE 118* 87  BUN 20 26*  CREATININE 1.72* 1.46*  CALCIUM 9.6 9.6   Liver Function Tests: Recent Labs    05/07/17 0947  AST 23  ALT 21  BILITOT 0.4  PROT 7.7   No results for input(s): LIPASE, AMYLASE in the last 8760 hours. No results for input(s): AMMONIA in the last 8760 hours. CBC: Recent Labs    05/07/17 0947 09/10/17 0947  WBC 5.8 6.0  NEUTROABS 3,921 4,098  HGB 12.8* 11.5*  HCT 37.5* 33.6*  MCV 89.5 90.3  PLT  370 327   Lipid Panel: Recent Labs    05/07/17 0947  CHOL 183  HDL 63  LDLCALC 101*  TRIG 95  CHOLHDL 2.9   Lab Results  Component Value Date   HGBA1C 5.2 09/10/2017   Assessment/Plan 1. Controlled type 2 diabetes mellitus with stage 3 chronic kidney disease, without long-term current use of insulin (HCC) - well controlled, cont same regimen, recheck hba1c today and urine micro - Microalbumin / creatinine urine ratio - Hemoglobin I5O - Basic metabolic panel - CBC with Differential/Platelet  2. Mixed hyperlipidemia due to type 2 diabetes mellitus (HCC) -cont lipitor therapy, not fasting so cannot check lipids today  3. Chronic kidney disease, stage III (moderate) (HCC) -stable renal function, f/u bmp today, Avoid nephrotoxic agents like nsaids, dose adjust renally excreted meds, hydrate.  4. Atherosclerosis of native coronary artery of native heart without angina pectoris -cont current regimen, no symptoms, stable  5. Weight loss, non-intentional -weight has trended up, given more boost shakes  6. Need for influenza vaccination - Flu vaccine HIGH DOSE PF (Fluzone High dose)  Labs/tests ordered:   Orders Placed This Encounter  Procedures  . Flu vaccine HIGH DOSE PF (Fluzone High dose)  . Microalbumin / creatinine urine ratio  . Hemoglobin A1c  . Basic metabolic panel    Order Specific Question:   Has the patient fasted?    Answer:   Yes  . CBC with Differential/Platelet   Next appt:  05/26/2018   Necole Minassian L. Nandita Mathenia, D.O. [facility] [address], [place] [address] Cell Phone (Mon-Fri 8am-5pm):  [phone] On Call:  [phone] & follow prompts after 5pm & weekends Office Phone:  [phone] Office Fax:  [phone]

## 2018-01-26 LAB — CBC WITH DIFFERENTIAL/PLATELET
Basophils Absolute: 30 cells/uL (ref 0–200)
Basophils Relative: 0.5 %
Eosinophils Absolute: 271 cells/uL (ref 15–500)
Eosinophils Relative: 4.6 %
HCT: 33.1 % — ABNORMAL LOW (ref 38.5–50.0)
Hemoglobin: 11 g/dL — ABNORMAL LOW (ref 13.2–17.1)
Lymphs Abs: 826 cells/uL — ABNORMAL LOW (ref 850–3900)
MCH: 30.8 pg (ref 27.0–33.0)
MCHC: 33.2 g/dL (ref 32.0–36.0)
MCV: 92.7 fL (ref 80.0–100.0)
MPV: 10.7 fL (ref 7.5–12.5)
Monocytes Relative: 8.3 %
Neutro Abs: 4283 cells/uL (ref 1500–7800)
Neutrophils Relative %: 72.6 %
Platelets: 304 10*3/uL (ref 140–400)
RBC: 3.57 10*6/uL — ABNORMAL LOW (ref 4.20–5.80)
RDW: 13.7 % (ref 11.0–15.0)
Total Lymphocyte: 14 %
WBC mixed population: 490 cells/uL (ref 200–950)
WBC: 5.9 10*3/uL (ref 3.8–10.8)

## 2018-01-26 LAB — BASIC METABOLIC PANEL
BUN/Creatinine Ratio: 17 (calc) (ref 6–22)
BUN: 21 mg/dL (ref 7–25)
CO2: 26 mmol/L (ref 20–32)
Calcium: 9.2 mg/dL (ref 8.6–10.3)
Chloride: 109 mmol/L (ref 98–110)
Creat: 1.26 mg/dL — ABNORMAL HIGH (ref 0.70–1.11)
Glucose, Bld: 95 mg/dL (ref 65–139)
Potassium: 4.1 mmol/L (ref 3.5–5.3)
Sodium: 141 mmol/L (ref 135–146)

## 2018-01-26 LAB — HEMOGLOBIN A1C
Hgb A1c MFr Bld: 5.3 % of total Hgb (ref <5.7)
Mean Plasma Glucose: 105 (calc)
eAG (mmol/L): 5.8 (calc)

## 2018-01-26 LAB — MICROALBUMIN / CREATININE URINE RATIO
Creatinine, Urine: 129 mg/dL (ref 20–320)
Microalb Creat Ratio: 67 ug/mg{creat} — ABNORMAL HIGH
Microalb, Ur: 8.7 mg/dL

## 2018-01-28 ENCOUNTER — Other Ambulatory Visit: Payer: Self-pay | Admitting: Internal Medicine

## 2018-01-28 DIAGNOSIS — I10 Essential (primary) hypertension: Secondary | ICD-10-CM

## 2018-01-28 MED ORDER — ALLOPURINOL 100 MG PO TABS: 100.0000 mg | ORAL_TABLET | Freq: Every day | ORAL | 3 refills | Status: DC

## 2018-01-28 MED ORDER — AMLODIPINE BESYLATE 5 MG PO TABS: 5.0000 mg | ORAL_TABLET | Freq: Every day | ORAL | 3 refills | Status: DC

## 2018-02-27 ENCOUNTER — Other Ambulatory Visit: Payer: Self-pay | Admitting: Internal Medicine

## 2018-03-01 NOTE — Telephone Encounter (Signed)
Received High medication alert for Atorvastatin and Celexa. Please advise on refill.

## 2018-05-26 ENCOUNTER — Ambulatory Visit (INDEPENDENT_AMBULATORY_CARE_PROVIDER_SITE_OTHER): Payer: Medicare HMO | Admitting: Family

## 2018-05-26 ENCOUNTER — Encounter: Payer: Self-pay | Admitting: Family

## 2018-05-26 ENCOUNTER — Ambulatory Visit: Payer: Self-pay

## 2018-05-26 VITALS — BP 120/64 | HR 55 | Temp 97.9°F | Resp 18 | Ht 67.0 in | Wt 167.4 lb

## 2018-05-26 DIAGNOSIS — Z0000 Encounter for general adult medical examination without abnormal findings: Secondary | ICD-10-CM | POA: Diagnosis not present

## 2018-05-26 NOTE — Progress Notes (Signed)
Subjective:   Kevin Schwartz is a 85 y.o. male who presents for Medicare Annual/Subsequent preventive examination.  Review of Systems:      Objective:    Vitals: BP 120/64   Pulse (!) 55   Temp 97.9 F (36.6 C) (Oral)   Resp 18   Ht 5\' 7"  (1.702 m)   Wt 167 lb 6.4 oz (75.9 kg)   SpO2 99%   BMI 26.22 kg/m   Body mass index is 26.22 kg/m.  Advanced Directives 05/26/2018 10/30/2017 09/14/2017 05/25/2017 09/18/2016 06/27/2016 06/25/2016  Does Patient Have a Medical Advance Directive? Yes No No No No No No  Type of Advance Directive [place]  Does patient want to make changes to medical advance directive? No - Patient declined - - - - - -  Copy of [place] in Chart? No - copy requested - - - - - -  Would patient like information on creating a medical advance directive? - No - Patient declined No - Patient declined Yes (MAU/Ambulatory/Procedural Areas - Information given) No - Patient declined No - Patient declined -    Tobacco Social History   Tobacco Use  Smoking Status Former Smoker  . Years: 54.00  . Types: Cigarettes  . Last attempt to quit: 09/29/2017  . Years since quitting: 0.6  Smokeless Tobacco Never Used  Tobacco Comment   10-29-2017  per pt was smoking 1pp2d, stopped June 2019     Counseling given: Not Answered Comment: 10-29-2017  per pt was smoking 1pp2d, stopped June 2019   Clinical Intake:  Pre-visit preparation completed: No  Pain : No/denies pain  BMI - recorded: 26.22 Nutritional Status: BMI 25 -29 Overweight Nutritional Risks: None Diabetes: No  How often do you need to have someone help you when you read instructions, pamphlets, or other written materials from your doctor or pharmacy?: 1 - Never What is the last grade level you completed in school?: 1 year of college   Interpreter Needed?: No  Information entered by :: Chittenango Ngetich FNP-C   Past Medical History:  Diagnosis Date  .  Anemia due to chronic kidney disease   . Bilateral inguinal hernia   . CAD (coronary artery disease) cardiologist-  dr bensimhon   PTCA of OM in 1998, [place] OM in 2000, Cath 9/07 LM ok LAD ok. LCX 95% in OM prior to previous stent RCA. nondominant normal EF normal. [place] DES to OM 2007 (PLACED PROXIAMAL TO PREVIOUS STENT)  . Chronic kidney disease, stage III (moderate) [facility])    nephrologist-  dr detarding  . Depression   . Diabetes mellitus type 2, diet-controlled ([place])    followed by pcp,  last A1c 5.2 on 09-10-2017 in epic  . Gout, unspecified    10-29-2017  per pt stable  . HLD (hyperlipidemia)   . HTN (hypertension)   . MGUS (monoclonal gammopathy of unknown significance) previously followed by dr Beryle Beams , lov and released 08/01/2011   IgG kappa dx 2002 8% plasma cells in bone marrow; no lesions on bone X-rays;  . Nocturia   . OA (osteoarthritis)   . OSA on CPAP    per study 01-30-2004  severe osa , AHI 51.6/hr  . PAD (peripheral artery disease) ([place])    05-21-2006  left renal artery stenosis, s/p balloon angioplasty and stenting;  last duplex 02/ 2012  normal , arteries patent  . S/P coronary artery stent placement  2000--  BMS x1  to OM;   2007-- DES x1  to OM proximal to previous stent  . Secondary hyperparathyroidism of renal origin ([place])   . Urgency of urination   . Wears glasses    Past Surgical History:  Procedure Laterality Date  . CARDIOVASCULAR STRESS TEST  2010   per dr bensimhon epic note dated 04-18-2016  normal  . [place]   PTCA to OM  . CORONARY ANGIOPLASTY WITH STENT PLACEMENT  2000   PCI and BMS x1 OM  . CORONARY ANGIOPLASTY WITH STENT PLACEMENT  12-18-2005  dr Claiborne Billings   DES x1 to proximal to previously placed stent in OM  . INGUINAL HERNIA REPAIR Bilateral 10/30/2017   Procedure: LAPAROSCOPIC  BILATERAL INGUINAL HERNIA REPAIR  AND LEFT [place];  Surgeon: Michael Boston, MD;  Location: [place];  Service:  General;  Laterality: Bilateral;  . INSERTION OF MESH Bilateral 10/30/2017   Procedure: INSERTION OF MESH;  Surgeon: Michael Boston, MD;  Location: [place];  Service: General;  Laterality: Bilateral;  . RENAL ANGIOPLASTY Left 05-21-2006   dr berry   and stenting  . TOTAL KNEE ARTHROPLASTY Left 07-22-2005   dr Shellia Carwin  [facility]  . TRANSTHORACIC ECHOCARDIOGRAM  04/18/2016   ef 81-19%, grade 1 diastolic dysfunction/  mild to moderate AR/  mild MR   Family History  Problem Relation Age of Onset  . Heart disease Mother   . Heart disease Sister   . Heart disease Sister   . Cancer Sister        type unknown  . Hypertension Sister   . Diabetes Brother   . Diabetes Brother   . Diabetes Other   . Coronary artery disease Other   . Cancer Other    Social History   Socioeconomic History  . Marital status: Widowed    Spouse name: Not on file  . Number of children: 1  . Years of education: Not on file  . Highest education level: Not on file  Occupational History  . Occupation: retired   Scientific laboratory technician  . Financial resource strain: Not hard at all  . Food insecurity:    Worry: Never true    Inability: Never true  . Transportation needs:    Medical: No    Non-medical: No  Tobacco Use  . Smoking status: Former Smoker    Years: 54.00    Types: Cigarettes    Last attempt to quit: 09/29/2017    Years since quitting: 0.6  . Smokeless tobacco: Never Used  . Tobacco comment: 10-29-2017  per pt was smoking 1pp2d, stopped June 2019  Substance and Sexual Activity  . Alcohol use: Not Currently  . Drug use: No  . Sexual activity: Not on file  Lifestyle  . Physical activity:    Days per week: 3 days    Minutes per session: 20 min  . Stress: Very much  Relationships  . Social connections:    Talks on phone: More than three times a week    Gets together: More than three times a week    Attends religious service: More than 4 times per year    Active member of club or  organization: No    Attends meetings of clubs or organizations: Never    Relationship status: Widowed  Other Topics Concern  . Not on file  Social History Narrative   Widowed 02/2012   Lives along   [place] smoking 2004  Alcohol none   Exercise works in [place]     Outpatient Encounter Medications as of 05/26/2018  Medication Sig  . ACCU-CHEK SOFTCLIX LANCETS lancets Use to test blood sugar daily. Dx:E11.8  . Alcohol Swabs (B-D SINGLE USE SWABS REGULAR) PADS Use with testing of blood sugar. Dx:E11.8  . allopurinol (ZYLOPRIM) 100 MG tablet Take 1 tablet (100 mg total) by mouth daily.  [name] amLODipine (NORVASC) 5 MG tablet Take 1 tablet (5 mg total) by mouth daily.  [name] aspirin EC 81 MG tablet Take 81 mg by mouth daily.  [name] atorvastatin (LIPITOR) 80 MG tablet Take 1 tablet (80 mg total) by mouth daily.  . citalopram (CELEXA) 20 MG tablet Take 1 tablet (20 mg total) by mouth daily.  [name] glucose blood (ACCU-CHEK AVIVA PLUS) test strip Use to check blood sugar daily. Dx:E11.8  . metoprolol tartrate (LOPRESSOR) 50 MG tablet Take 1 tablet by mouth twice daily.  . nitroGLYCERIN (NITROSTAT) 0.4 MG SL tablet Place 0.4 mg under the tongue every 5 (five) minutes as needed for chest pain. Up to 3 doses  . traMADol (ULTRAM) 50 MG tablet Take 1-2 tablets (50-100 mg total) by mouth every 6 (six) hours as needed for moderate pain or severe pain.  [name] ULTRA-THIN II MINI PEN NEEDLE 31G X 5 MM MISC    No facility-administered encounter medications on file as of 05/26/2018.     Activities of Daily Living In your present state of health, do you have any difficulty performing the following activities: 05/26/2018 10/30/2017  Hearing? N N  Vision? N N  Difficulty concentrating or making decisions? N N  Walking or climbing stairs? Y Y  Comment takes time due to hx of left knee replacement  -  Dressing or bathing? N N  Doing errands, shopping? N -  Preparing Food and eating ? N -  Using the  Toilet? N -  In the past six months, have you accidently leaked urine? N -  Do you have problems with loss of bowel control? N -  Managing your Medications? N -  Managing your Finances? N -  Housekeeping or managing your Housekeeping? N -  Some recent data might be hidden    Patient Care Team: Gayland Curry, DO as PCP - General (Geriatric Medicine) Bensimhon, Shaune Pascal, MD as Consulting Physician (Cardiology) Penninger, Ria Comment, [place] as Physician Assistant (Nephrology) Donato Heinz, MD as Consulting Physician (Nephrology) Michael Boston, MD as Consulting Physician (General Surgery)   Assessment:   This is a routine wellness examination for [place].  Exercise Activities and Dietary recommendations Current Exercise Habits: Home exercise routine, Type of exercise: Other - see comments;walking(house/yard work ), Time (Minutes): 15, Frequency (Times/Week): 3, Weekly Exercise (Minutes/Week): 45, Intensity: Mild, Exercise limited by: Other - see comments(left knee stiffiness )  Goals    . <enter goal here>     Starting 05/09/16, I will maintain currant lifestyle, while working on making it better.     . Exercise 3x per week (30 min per time)     Patient would like to exercise more than he does now       Fall Risk Fall Risk  05/26/2018 01/25/2018 09/14/2017 05/25/2017 05/11/2017  Falls in the past year? 0 No No No No  Number falls in past yr: 0 - - - -  Injury with Fall? 0 - - - -  Risk Factor Category  - - - - -  Risk for fall  due to : - - - - -  Follow up - - - - -   Is the patient's home free of loose throw rugs in walkways, pet beds, electrical cords, etc?   no      Grab bars in the bathroom? yes      Handrails on the stairs?   yes      Adequate lighting?   yes  Depression Screen PHQ 2/9 Scores 05/26/2018 01/25/2018 09/14/2017 05/25/2017  PHQ - 2 Score 0 0 0 0    Cognitive Function MMSE - Mini Mental State Exam 05/26/2018 05/25/2017 05/09/2016 04/26/2015  Orientation to time 5 5  5 5   Orientation to Place 5 5 5 5   Registration 3 3 3 3   Attention/ Calculation 2 4 3 4   Recall 3 1 3 3   Language- name 2 objects 2 2 2 2   Language- repeat 1 1 1 1   Language- follow 3 step command 3 3 3 3   Language- read & follow direction 1 1 1 1   Write a sentence 1 1 1 1   Copy design 1 1 1 1   Total score 27 27 28 29         Immunization History  Administered Date(s) Administered  . Influenza, High Dose Seasonal PF 01/08/2017, 01/25/2018  . Influenza,inj,Quad PF,6+ Mos 12/27/2012, 03/06/2014, 04/02/2015, 11/30/2015  . Pneumococcal Conjugate-13 09/22/2014  . Pneumococcal Polysaccharide-23 12/27/2012  . Tdap 04/15/2011    Qualifies for Shingles Vaccine? Declined due to cost   Screening Tests Health Maintenance  Topic Date Due  . OPHTHALMOLOGY EXAM  04/14/2014  . HEMOGLOBIN A1C  07/27/2018  . FOOT EXAM  09/15/2018  . URINE MICROALBUMIN  01/26/2019  . TETANUS/TDAP  04/14/2021  . INFLUENZA VACCINE  Completed  . PNA vac Low Risk Adult  Completed   Cancer Screenings: Lung: Low Dose CT Chest recommended if Age 72-80 years, 30 pack-year currently smoking OR have quit w/in 15years. Patient does not qualify. Colorectal:up to date  Additional Screenings: Hepatitis C Screening: Declined      Plan:  Over weight: Recommend low carbohydrate,low saturated fat and high vegetables diet and exercise.   I have personally reviewed and noted the following in the patient's chart:   . Medical and social history . Use of alcohol, tobacco or illicit drugs  . Current medications and supplements . Functional ability and status . Nutritional status . Physical activity . Advanced directives . List of other physicians . Hospitalizations, surgeries, and ER visits in previous 12 months . Vitals . Screenings to include cognitive, depression, and falls . Referrals and appointments  In addition, I have reviewed and discussed with patient certain preventive protocols, quality metrics, and  best practice recommendations. A written personalized care plan for preventive services as well as general preventive health recommendations were provided to patient.     Sandrea Hughs, NP  05/26/2018

## 2018-05-26 NOTE — Patient Instructions (Signed)
Kevin Schwartz , Thank you for taking time to come for your Medicare Wellness Visit. I appreciate your ongoing commitment to your health goals. Please review the following plan we discussed and let me know if I can assist you in the future.   Screening recommendations/referrals: Colonoscopy: up to date  Recommended yearly ophthalmology/optometry visit for glaucoma screening and checkup Recommended yearly dental visit for hygiene and checkup  Vaccinations: Influenza vaccine: Up to date  Pneumococcal vaccine: Up to date  Tdap vaccine : Up to date  Shingles vaccine: Decline due cost   Advanced directives: copy requested   Conditions/risks identified: Hypertension,Gender,Age and Hyperlipidemia  Next appointment:1 year   Preventive Care 11 Years and Older, Male Preventive care refers to lifestyle choices and visits with your health care provider that can promote health and wellness. What does preventive care include?  A yearly physical exam. This is also called an annual well check.  Dental exams once or twice a year.  Routine eye exams. Ask your health care provider how often you should have your eyes checked.  Personal lifestyle choices, including:  Daily care of your teeth and gums.  Regular physical activity.  Eating a healthy diet.  Avoiding tobacco and drug use.  Limiting alcohol use.  Practicing safe sex.  Taking low doses of aspirin every day.  Taking vitamin and mineral supplements as recommended by your health care provider. What happens during an annual well check? The services and screenings done by your health care provider during your annual well check will depend on your age, overall health, lifestyle risk factors, and family history of disease. Counseling  Your health care provider may ask you questions about your:  Alcohol use.  Tobacco use.  Drug use.  Emotional well-being.  Home and relationship well-being.  Sexual activity.  Eating  habits.  History of falls.  Memory and ability to understand (cognition).  Work and work Statistician. Screening  You may have the following tests or measurements:  Height, weight, and BMI.  Blood pressure.  Lipid and cholesterol levels. These may be checked every 5 years, or more frequently if you are over 36 years old.  Skin check.  Lung cancer screening. You may have this screening every year starting at age 24 if you have a 30-pack-year history of smoking and currently smoke or have quit within the past 15 years.  Fecal occult blood test (FOBT) of the stool. You may have this test every year starting at age 75.  Flexible sigmoidoscopy or colonoscopy. You may have a sigmoidoscopy every 5 years or a colonoscopy every 10 years starting at age 57.  Prostate cancer screening. Recommendations will vary depending on your family history and other risks.  Hepatitis C blood test.  Hepatitis B blood test.  Sexually transmitted disease (STD) testing.  Diabetes screening. This is done by checking your blood sugar (glucose) after you have not eaten for a while (fasting). You may have this done every 1-3 years.  Abdominal aortic aneurysm (AAA) screening. You may need this if you are a current or former smoker.  Osteoporosis. You may be screened starting at age 38 if you are at high risk. Talk with your health care provider about your test results, treatment options, and if necessary, the need for more tests. Vaccines  Your health care provider may recommend certain vaccines, such as:  Influenza vaccine. This is recommended every year.  Tetanus, diphtheria, and acellular pertussis (Tdap, Td) vaccine. You may need a Td booster every 10  years.  Zoster vaccine. You may need this after age 94.  Pneumococcal 13-valent conjugate (PCV13) vaccine. One dose is recommended after age 76.  Pneumococcal polysaccharide (PPSV23) vaccine. One dose is recommended after age 64. Talk to your health  care provider about which screenings and vaccines you need and how often you need them. This information is not intended to replace advice given to you by your health care provider. Make sure you discuss any questions you have with your health care provider. Document Released: 04/27/2015 Document Revised: 12/19/2015 Document Reviewed: 01/30/2015 Elsevier Interactive Patient Education  2017 [place] Prevention in the Home Falls can cause injuries. They can happen to people of all ages. There are many things you can do to make your home safe and to help prevent falls. What can I do on the outside of my home?  Regularly fix the edges of walkways and driveways and fix any cracks.  Remove anything that might make you trip as you walk through a door, such as a raised step or threshold.  Trim any bushes or trees on the path to your home.  Use bright outdoor lighting.  Clear any walking paths of anything that might make someone trip, such as rocks or tools.  Regularly check to see if handrails are loose or broken. Make sure that both sides of any steps have handrails.  Any raised decks and porches should have guardrails on the edges.  Have any leaves, snow, or ice cleared regularly.  Use sand or salt on walking paths during winter.  Clean up any spills in your garage right away. This includes oil or grease spills. What can I do in the bathroom?  Use night lights.  Install grab bars by the toilet and in the tub and shower. Do not use towel bars as grab bars.  Use non-skid mats or decals in the tub or shower.  If you need to sit down in the shower, use a plastic, non-slip stool.  Keep the floor dry. Clean up any water that spills on the floor as soon as it happens.  Remove soap buildup in the tub or shower regularly.  Attach bath mats securely with double-sided non-slip rug tape.  Do not have throw rugs and other things on the floor that can make you trip. What can I do  in the bedroom?  Use night lights.  Make sure that you have a light by your bed that is easy to reach.  Do not use any sheets or blankets that are too big for your bed. They should not hang down onto the floor.  Have a firm chair that has side arms. You can use this for support while you get dressed.  Do not have throw rugs and other things on the floor that can make you trip. What can I do in the kitchen?  Clean up any spills right away.  Avoid walking on wet floors.  Keep items that you use a lot in easy-to-reach places.  If you need to reach something above you, use a strong step stool that has a grab bar.  Keep electrical cords out of the way.  Do not use floor polish or wax that makes floors slippery. If you must use wax, use non-skid floor wax.  Do not have throw rugs and other things on the floor that can make you trip. What can I do with my stairs?  Do not leave any items on the stairs.  Make sure that  there are handrails on both sides of the stairs and use them. Fix handrails that are broken or loose. Make sure that handrails are as long as the stairways.  Check any carpeting to make sure that it is firmly attached to the stairs. Fix any carpet that is loose or worn.  Avoid having throw rugs at the top or bottom of the stairs. If you do have throw rugs, attach them to the floor with carpet tape.  Make sure that you have a light switch at the top of the stairs and the bottom of the stairs. If you do not have them, ask someone to add them for you. What else can I do to help prevent falls?  Wear shoes that:  Do not have high heels.  Have rubber bottoms.  Are comfortable and fit you well.  Are closed at the toe. Do not wear sandals.  If you use a stepladder:  Make sure that it is fully opened. Do not climb a closed stepladder.  Make sure that both sides of the stepladder are locked into place.  Ask someone to hold it for you, if possible.  Clearly mark  and make sure that you can see:  Any grab bars or handrails.  First and last steps.  Where the edge of each step is.  Use tools that help you move around (mobility aids) if they are needed. These include:  Canes.  Walkers.  Scooters.  Crutches.  Turn on the lights when you go into a dark area. Replace any light bulbs as soon as they burn out.  Set up your furniture so you have a clear path. Avoid moving your furniture around.  If any of your floors are uneven, fix them.  If there are any pets around you, be aware of where they are.  Review your medicines with your doctor. Some medicines can make you feel dizzy. This can increase your chance of falling. Ask your doctor what other things that you can do to help prevent falls. This information is not intended to replace advice given to you by your health care provider. Make sure you discuss any questions you have with your health care provider. Document Released: 01/25/2009 Document Revised: 09/06/2015 Document Reviewed: 05/05/2014 Elsevier Interactive Patient Education  2017 [employer].

## 2018-05-31 ENCOUNTER — Ambulatory Visit: Payer: Medicare HMO | Admitting: Internal Medicine

## 2018-06-11 ENCOUNTER — Other Ambulatory Visit: Payer: Self-pay

## 2018-06-11 MED ORDER — METOPROLOL TARTRATE 50 MG PO TABS: 50.0000 mg | ORAL_TABLET | Freq: Two times a day (BID) | ORAL | 1 refills | Status: DC

## 2018-06-11 MED ORDER — AMLODIPINE BESYLATE 5 MG PO TABS: 5.0000 mg | ORAL_TABLET | Freq: Every day | ORAL | 3 refills | Status: DC

## 2018-06-11 MED ORDER — ATORVASTATIN CALCIUM 80 MG PO TABS: 80.0000 mg | ORAL_TABLET | Freq: Every day | ORAL | 3 refills | Status: DC

## 2018-06-11 MED ORDER — ALLOPURINOL 100 MG PO TABS: 100.0000 mg | ORAL_TABLET | Freq: Every day | ORAL | 3 refills | Status: DC

## 2018-06-11 MED ORDER — CITALOPRAM HYDROBROMIDE 20 MG PO TABS: 20.0000 mg | ORAL_TABLET | Freq: Every day | ORAL | 3 refills | Status: DC

## 2018-06-11 NOTE — Telephone Encounter (Signed)
Received very high dose allergy alert for atorvastatin

## 2018-06-28 ENCOUNTER — Ambulatory Visit (INDEPENDENT_AMBULATORY_CARE_PROVIDER_SITE_OTHER): Payer: Medicare HMO | Admitting: Internal Medicine

## 2018-06-28 ENCOUNTER — Encounter: Payer: Self-pay | Admitting: Internal Medicine

## 2018-06-28 ENCOUNTER — Other Ambulatory Visit: Payer: Self-pay

## 2018-06-28 VITALS — BP 122/70 | HR 58 | Temp 97.8°F | Ht 67.0 in | Wt 171.0 lb

## 2018-06-28 DIAGNOSIS — E1169 Type 2 diabetes mellitus with other specified complication: Secondary | ICD-10-CM | POA: Diagnosis not present

## 2018-06-28 DIAGNOSIS — I739 Peripheral vascular disease, unspecified: Secondary | ICD-10-CM | POA: Diagnosis not present

## 2018-06-28 DIAGNOSIS — N183 Chronic kidney disease, stage 3 unspecified: Secondary | ICD-10-CM

## 2018-06-28 DIAGNOSIS — E782 Mixed hyperlipidemia: Secondary | ICD-10-CM

## 2018-06-28 DIAGNOSIS — I150 Renovascular hypertension: Secondary | ICD-10-CM | POA: Diagnosis not present

## 2018-06-28 DIAGNOSIS — R809 Proteinuria, unspecified: Secondary | ICD-10-CM | POA: Diagnosis not present

## 2018-06-28 DIAGNOSIS — I2510 Atherosclerotic heart disease of native coronary artery without angina pectoris: Secondary | ICD-10-CM

## 2018-06-28 DIAGNOSIS — D509 Iron deficiency anemia, unspecified: Secondary | ICD-10-CM | POA: Diagnosis not present

## 2018-06-28 DIAGNOSIS — E1121 Type 2 diabetes mellitus with diabetic nephropathy: Secondary | ICD-10-CM | POA: Diagnosis not present

## 2018-06-28 DIAGNOSIS — D472 Monoclonal gammopathy: Secondary | ICD-10-CM | POA: Diagnosis not present

## 2018-06-28 DIAGNOSIS — E1122 Type 2 diabetes mellitus with diabetic chronic kidney disease: Secondary | ICD-10-CM

## 2018-06-28 DIAGNOSIS — T783XXA Angioneurotic edema, initial encounter: Secondary | ICD-10-CM | POA: Diagnosis not present

## 2018-06-28 NOTE — Progress Notes (Signed)
Location:  [facility] clinic Provider:  Katelynne Revak L. Mariea Clonts, D.O., C.M.D.  Goals of Care:  Advanced Directives 05/26/2018  Does Patient Have a Medical Advance Directive? Yes  Type of Advance Directive [place]  Does patient want to make changes to medical advance directive? No - Patient declined  Copy of [place] in Chart? No - copy requested  Would patient like information on creating a medical advance directive? -   Chief Complaint  Patient presents with  . Medical Management of Chronic Issues    51mth follow-up    HPI: Patient is a 85 y.o. male seen today for medical management of chronic diseases.    He's gained some weight.  He had been losing and not eating properly for a while.  No chest pain or sob.    Has allergy to ace/arb.    Goes this month, he thinks next week, to ophtho--[facility].  Does not want shingles vaccine due to cost.    No gout flare ups.    No concerns at all.    MMSE - Mini Mental State Exam 05/26/2018 05/25/2017 05/09/2016  Orientation to time 5 5 5   Orientation to Place 5 5 5   Registration 3 3 3   Attention/ Calculation 2 4 3   Recall 3 1 3   Language- name 2 objects 2 2 2   Language- repeat 1 1 1   Language- follow 3 step command 3 3 3   Language- read & follow direction 1 1 1   Write a sentence 1 1 1   Copy design 1 1 1   Total score 27 27 28   admits his memory is not as good.    No pains in legs or feet.    CBGs daily are doing ok first thing in the morning.  98, 90.  No low ones.    Past Medical History:  Diagnosis Date  . Anemia due to chronic kidney disease   . Bilateral inguinal hernia   . CAD (coronary artery disease) cardiologist-  dr bensimhon   PTCA of OM in 1998, [place] OM in 2000, Cath 9/07 LM ok LAD ok. LCX 95% in OM prior to previous stent RCA. nondominant normal EF normal. [place] DES to OM 2007 (PLACED PROXIAMAL TO PREVIOUS STENT)  . Chronic kidney disease, stage III (moderate) [facility])     nephrologist-  dr detarding  . Depression   . Diabetes mellitus type 2, diet-controlled ([place])    followed by pcp,  last A1c 5.2 on 09-10-2017 in epic  . Gout, unspecified    10-29-2017  per pt stable  . HLD (hyperlipidemia)   . HTN (hypertension)   . MGUS (monoclonal gammopathy of unknown significance) previously followed by dr Beryle Beams , [place] and released 08/01/2011   IgG kappa dx 2002 8% plasma cells in bone marrow; no lesions on bone X-rays;  . Nocturia   . OA (osteoarthritis)   . OSA on CPAP    per study 01-30-2004  severe osa , AHI 51.6/hr  . PAD (peripheral artery disease) ([place])    05-21-2006  left renal artery stenosis, s/p balloon angioplasty and stenting;  last duplex 02/ 2012  normal , arteries patent  . S/P coronary artery stent placement    2000--  BMS x1  to OM;   2007-- DES x1  to OM proximal to previous stent  . Secondary hyperparathyroidism of renal origin ([place])   . Urgency of urination   . Wears glasses     Past Surgical History:  Procedure Laterality Date  . CARDIOVASCULAR STRESS TEST  2010   per dr bensimhon epic note dated 04-18-2016  normal  . [place]   PTCA to OM  . CORONARY ANGIOPLASTY WITH STENT PLACEMENT  2000   PCI and BMS x1 OM  . CORONARY ANGIOPLASTY WITH STENT PLACEMENT  12-18-2005  dr Claiborne Billings   DES x1 to proximal to previously placed stent in OM  . INGUINAL HERNIA REPAIR Bilateral 10/30/2017   Procedure: LAPAROSCOPIC  BILATERAL INGUINAL HERNIA REPAIR  AND LEFT [place];  Surgeon: Michael Boston, MD;  Location: [facility];  Service: General;  Laterality: Bilateral;  . INSERTION OF MESH Bilateral 10/30/2017   Procedure: INSERTION OF MESH;  Surgeon: Michael Boston, MD;  Location: [facility];  Service: General;  Laterality: Bilateral;  . RENAL ANGIOPLASTY Left 05-21-2006   dr berry   and stenting  . TOTAL KNEE ARTHROPLASTY Left 07-22-2005   dr Shellia Carwin  [facility]  . TRANSTHORACIC ECHOCARDIOGRAM   04/18/2016   ef 96-28%, grade 1 diastolic dysfunction/  mild to moderate AR/  mild MR    Allergies  Allergen Reactions  . Lisinopril Swelling    angioedema  . Losartan Potassium Swelling    Angioedema   . Crestor [Rosuvastatin] Swelling    angioedema    Outpatient Encounter Medications as of 06/28/2018  Medication Sig  . ACCU-CHEK SOFTCLIX LANCETS lancets Use to test blood sugar daily. Dx:E11.8  . Alcohol Swabs (B-D SINGLE USE SWABS REGULAR) PADS Use with testing of blood sugar. Dx:E11.8  . allopurinol (ZYLOPRIM) 100 MG tablet Take 1 tablet (100 mg total) by mouth daily.  [name] amLODipine (NORVASC) 5 MG tablet Take 1 tablet (5 mg total) by mouth daily.  [name] aspirin EC 81 MG tablet Take 81 mg by mouth daily.  [name] atorvastatin (LIPITOR) 80 MG tablet Take 1 tablet (80 mg total) by mouth daily.  . citalopram (CELEXA) 20 MG tablet Take 1 tablet (20 mg total) by mouth daily.  [name] glucose blood (ACCU-CHEK AVIVA PLUS) test strip Use to check blood sugar daily. Dx:E11.8  . metoprolol tartrate (LOPRESSOR) 50 MG tablet Take 1 tablet (50 mg total) by mouth 2 (two) times daily.  . nitroGLYCERIN (NITROSTAT) 0.4 MG SL tablet Place 0.4 mg under the tongue every 5 (five) minutes as needed for chest pain. Up to 3 doses  . traMADol (ULTRAM) 50 MG tablet Take 1-2 tablets (50-100 mg total) by mouth every 6 (six) hours as needed for moderate pain or severe pain.  [name] ULTRA-THIN II MINI PEN NEEDLE 31G X 5 MM MISC    No facility-administered encounter medications on file as of 06/28/2018.     Review of Systems:  Review of Systems  Constitutional: Negative for chills, fever and malaise/fatigue.  HENT: Positive for hearing loss.   Eyes: Negative for blurred vision.       Glasses  Respiratory: Negative for cough and shortness of breath.   Cardiovascular: Negative for chest pain, palpitations and leg swelling.  Gastrointestinal: Negative for abdominal pain, blood in stool, constipation, diarrhea and melena.   Genitourinary: Negative for dysuria.       Some incontinence notable  Musculoskeletal: Negative for falls, joint pain and myalgias.  Skin: Negative for rash.  Neurological: Negative for dizziness and loss of consciousness.  Endo/Heme/Allergies: Bruises/bleeds easily.  Psychiatric/Behavioral: Positive for memory loss. Negative for depression. The patient is not nervous/anxious and does not have insomnia.     Health Maintenance  Topic Date  Due  . OPHTHALMOLOGY EXAM  04/14/2014  . HEMOGLOBIN A1C  07/27/2018  . FOOT EXAM  09/15/2018  . URINE MICROALBUMIN  01/26/2019  . TETANUS/TDAP  04/14/2021  . INFLUENZA VACCINE  Completed  . PNA vac Low Risk Adult  Completed    Physical Exam: Vitals:   06/28/18 1508  BP: 122/70  Pulse: (!) 58  Temp: 97.8 F (36.6 C)  TempSrc: Oral  SpO2: 97%  Weight: 171 lb (77.6 kg)  Height: 5\' 7"  (1.702 m)   Body mass index is 26.78 kg/m. Physical Exam Vitals signs reviewed.  Constitutional:      General: He is not in acute distress.    Appearance: Normal appearance. He is not toxic-appearing.  HENT:     Head: Normocephalic and atraumatic.  Cardiovascular:     Rate and Rhythm: Normal rate and regular rhythm.     Pulses: Normal pulses.     Heart sounds: Normal heart sounds.  Pulmonary:     Effort: Pulmonary effort is normal.     Breath sounds: Normal breath sounds.  Abdominal:     General: Bowel sounds are normal.  Musculoskeletal: Normal range of motion.        General: No tenderness.     Comments: Stooped shuffling posture  Skin:    General: Skin is warm and dry.     Capillary Refill: Capillary refill takes less than 2 seconds.  Neurological:     General: No focal deficit present.     Mental Status: He is alert and oriented to person, place, and time.     Cranial Nerves: No cranial nerve deficit.     Gait: Gait abnormal.  Psychiatric:        Mood and Affect: Mood normal.    Labs reviewed: Basic Metabolic Panel: Recent Labs     09/10/17 0947 01/25/18 1150  NA 140 141  K 4.3 4.1  CL 108 109  CO2 25 26  GLUCOSE 87 95  BUN 26* 21  CREATININE 1.46* 1.26*  CALCIUM 9.6 9.2   Liver Function Tests: No results for input(s): AST, ALT, ALKPHOS, BILITOT, PROT, ALBUMIN in the last 8760 hours. No results for input(s): LIPASE, AMYLASE in the last 8760 hours. No results for input(s): AMMONIA in the last 8760 hours. CBC: Recent Labs    09/10/17 0947 01/25/18 1150  WBC 6.0 5.9  NEUTROABS 4,098 4,283  HGB 11.5* 11.0*  HCT 33.6* 33.1*  MCV 90.3 92.7  PLT 327 304   Lipid Panel: No results for input(s): CHOL, HDL, LDLCALC, TRIG, CHOLHDL, LDLDIRECT in the last 8760 hours. Lab Results  Component Value Date   HGBA1C 5.3 01/25/2018    Procedures since last visit: No results found.  Assessment/Plan 1. Controlled type 2 diabetes mellitus with stage 3 chronic kidney disease, without long-term current use of insulin (HCC) - has been well-controlled, cont same regimen - CBC with Differential/Platelet - COMPLETE METABOLIC PANEL WITH GFR - Hemoglobin A1c - Microalbumin / creatinine urine ratio  2. Mixed hyperlipidemia due to type 2 diabetes mellitus ([place]) - Lab Results  Component Value Date   CHOL 183 05/07/2017   HDL 63 05/07/2017   LDLCALC 101 (H) 05/07/2017   TRIG 95 05/07/2017   CHOLHDL 2.9 05/07/2017  LDL above goal--on max dose lipitor already, but was just refilled, suspect he was not taking faithfully  3. Peripheral vascular disease, unspecified ([place]) -no issues with leg pain or wounds, cont to monitor  4. Chronic kidney disease, stage III (moderate) (HCC) -  Avoid nephrotoxic agents like nsaids, dose adjust renally excreted meds, hydrate. -just saw nephrology this am  5. Atherosclerosis of native coronary artery of native heart without angina pectoris -no symptoms, stable, cont current regimen  Labs/tests ordered:   Orders Placed This Encounter  Procedures  . CBC with Differential/Platelet  .  COMPLETE METABOLIC PANEL WITH GFR  . Hemoglobin A1c  . Microalbumin / creatinine urine ratio    Next appt:  6 mos med mgt, fasting labs before  Perrin Eddleman L. Caidan Hubbert, D.O. [facility] [address], [place] [address] Cell Phone (Mon-Fri 8am-5pm):  [phone] On Call:  [phone] & follow prompts after 5pm & weekends Office Phone:  [phone] Office Fax:  [phone]

## 2018-06-29 ENCOUNTER — Other Ambulatory Visit: Payer: Self-pay | Admitting: Internal Medicine

## 2018-06-29 LAB — MICROALBUMIN / CREATININE URINE RATIO
Creatinine, Urine: 117 mg/dL (ref 20–320)
Microalb Creat Ratio: 72 mcg/mg creat — ABNORMAL HIGH (ref <30)
Microalb, Ur: 8.4 mg/dL

## 2018-06-29 LAB — CBC WITH DIFFERENTIAL/PLATELET
Absolute Monocytes: 621 cells/uL (ref 200–950)
Basophils Absolute: 28 cells/uL (ref 0–200)
Basophils Relative: 0.4 %
Eosinophils Absolute: 331 cells/uL (ref 15–500)
Eosinophils Relative: 4.8 %
HCT: 34.7 % — ABNORMAL LOW (ref 38.5–50.0)
Hemoglobin: 11.8 g/dL — ABNORMAL LOW (ref 13.2–17.1)
Lymphs Abs: 1235 cells/uL (ref 850–3900)
MCH: 31.5 pg (ref 27.0–33.0)
MCHC: 34 g/dL (ref 32.0–36.0)
MCV: 92.5 fL (ref 80.0–100.0)
MPV: 11 fL (ref 7.5–12.5)
Monocytes Relative: 9 %
Neutro Abs: 4685 cells/uL (ref 1500–7800)
Neutrophils Relative %: 67.9 %
Platelets: 318 10*3/uL (ref 140–400)
RBC: 3.75 10*6/uL — ABNORMAL LOW (ref 4.20–5.80)
RDW: 13.8 % (ref 11.0–15.0)
Total Lymphocyte: 17.9 %
WBC: 6.9 10*3/uL (ref 3.8–10.8)

## 2018-06-29 LAB — HEMOGLOBIN A1C
Hgb A1c MFr Bld: 5.5 % of total Hgb (ref <5.7)
Mean Plasma Glucose: 111 (calc)
eAG (mmol/L): 6.2 (calc)

## 2018-06-29 LAB — COMPLETE METABOLIC PANEL WITH GFR
AG Ratio: 1.5 (calc) (ref 1.0–2.5)
ALT: 15 U/L (ref 9–46)
AST: 20 U/L (ref 10–35)
Albumin: 4.2 g/dL (ref 3.6–5.1)
Alkaline phosphatase (APISO): 66 U/L (ref 35–144)
BUN/Creatinine Ratio: 12 (calc) (ref 6–22)
BUN: 23 mg/dL (ref 7–25)
CO2: 24 mmol/L (ref 20–32)
Calcium: 9.2 mg/dL (ref 8.6–10.3)
Chloride: 111 mmol/L — ABNORMAL HIGH (ref 98–110)
Creat: 1.88 mg/dL — ABNORMAL HIGH (ref 0.70–1.11)
GFR, Est African American: 37 mL/min/{1.73_m2} — ABNORMAL LOW (ref >=60)
GFR, Est Non African American: 32 mL/min/{1.73_m2} — ABNORMAL LOW (ref >=60)
Globulin: 2.8 g/dL (calc) (ref 1.9–3.7)
Glucose, Bld: 117 mg/dL (ref 65–139)
Potassium: 4.8 mmol/L (ref 3.5–5.3)
Sodium: 141 mmol/L (ref 135–146)
Total Bilirubin: 0.4 mg/dL (ref 0.2–1.2)
Total Protein: 7 g/dL (ref 6.1–8.1)

## 2018-06-29 NOTE — Telephone Encounter (Signed)
Can someone please call to see if this pt is using [employer] or [place]? This was sent in by Dinah to [facility] and now today [facility] is requesting.

## 2018-07-01 ENCOUNTER — Encounter: Payer: Self-pay | Admitting: *Deleted

## 2018-07-01 NOTE — Telephone Encounter (Signed)
Rx last filled at [facility] on 06/11/2018 #180 with an additional refill   Spoke with patient, per patient he canceled [facility] (pharmacy currently requesting rx) cause he had to pay for medications through them. Patient receives medications from [facility] for free. Patient would like [facility] removed from his pharmacy list.  Pharmacy list updated, and rx denied from [place]

## 2018-07-03 ENCOUNTER — Other Ambulatory Visit: Payer: Self-pay

## 2018-07-03 ENCOUNTER — Ambulatory Visit (HOSPITAL_COMMUNITY)
Admission: EM | Admit: 2018-07-03 | Discharge: 2018-07-03 | Disposition: A | Discharge status: Home / Self Care | Payer: Medicare HMO | Attending: Physician Assistant | Admitting: Physician Assistant

## 2018-07-03 ENCOUNTER — Encounter (HOSPITAL_COMMUNITY): Payer: Self-pay

## 2018-07-03 DIAGNOSIS — M109 Gout, unspecified: Secondary | ICD-10-CM | POA: Diagnosis not present

## 2018-07-03 MED ORDER — PREDNISONE 10 MG PO TABS: 40.0000 mg | ORAL_TABLET | Freq: Every day | ORAL | 0 refills | Status: DC

## 2018-07-03 MED ORDER — CEPHALEXIN 250 MG PO CAPS: 250.0000 mg | ORAL_CAPSULE | Freq: Three times a day (TID) | ORAL | 0 refills | Status: DC

## 2018-07-03 NOTE — Discharge Instructions (Signed)
Start the meds today.  Come back if your not getting better in a few days. Stay away from sick people.

## 2018-07-03 NOTE — ED Provider Notes (Signed)
07/03/2018 2:45 PM   DOB: Sep 26, 1930 / MRN: [id number]  SUBJECTIVE:  Kevin Schwartz is a 85 y.o. male presenting for worsening exquisite left hand pain that started this morning after waking. Hx of gout and takes allopurinol.    He is allergic to lisinopril; losartan potassium; and crestor [rosuvastatin].   He  has a past medical history of Anemia due to chronic kidney disease, Bilateral inguinal hernia, CAD (coronary artery disease) (cardiologist-  dr bensimhon), Chronic kidney disease, stage III (moderate) ([place]), Depression, Diabetes mellitus type 2, diet-controlled ([place]), Gout, unspecified, HLD (hyperlipidemia), HTN (hypertension), MGUS (monoclonal gammopathy of unknown significance) (previously followed by dr Beryle Beams , [place] [facility] and released 08/01/2011), Nocturia, OA (osteoarthritis), OSA on CPAP, PAD (peripheral artery disease) ([place]), S/P coronary artery stent placement, Secondary hyperparathyroidism of renal origin ([place]), Urgency of urination, and Wears glasses.    He  reports that he quit smoking about 9 months ago. His smoking use included cigarettes. He quit after 54.00 years of use. He has never used smokeless tobacco. He reports previous alcohol use. He reports that he does not use drugs. He  has no history on file for sexual activity. The patient  has a past surgical history that includes Total knee arthroplasty (Left, 07-22-2005   dr Shellia Carwin  [facility]); Coronary angioplasty (1998); Coronary angioplasty with stent (2000); Coronary angioplasty with stent (12-18-2005  dr Claiborne Billings); Renal angioplasty (Left, 05-21-2006   dr berry); transthoracic echocardiogram (04/18/2016); Cardiovascular stress test (2010); Inguinal hernia repair (Bilateral, 10/30/2017); Insertion of mesh (Bilateral, 10/30/2017); and Hernia repair.  His family history includes Cancer in his sister and another family member; Coronary artery disease in an other family member; Diabetes in his brother, brother and another family member;  Heart disease in his mother, sister, and sister; Hypertension in his sister.  Review of Systems  Constitutional: Negative for chills, diaphoresis and fever.  Gastrointestinal: Negative for nausea.  Musculoskeletal: Positive for joint pain and myalgias.  Skin: Negative for rash.  Neurological: Negative for dizziness.    OBJECTIVE:  BP (!) 144/73 (BP Location: Right Arm)   Pulse 67   Temp 98.4 F (36.9 C) (Oral)   Resp 16   SpO2 98%   Wt Readings from Last 3 Encounters:  06/28/18 171 lb (77.6 kg)  05/26/18 167 lb 6.4 oz (75.9 kg)  01/25/18 161 lb (73 kg)   Temp Readings from Last 3 Encounters:  07/03/18 98.4 F (36.9 C) (Oral)  06/28/18 97.8 F (36.6 C) (Oral)  05/26/18 97.9 F (36.6 C) (Oral)   BP Readings from Last 3 Encounters:  07/03/18 (!) 144/73  06/28/18 122/70  05/26/18 120/64   Pulse Readings from Last 3 Encounters:  07/03/18 67  06/28/18 (!) 58  05/26/18 (!) 55   Lab Results  Component Value Date   HGBA1C 5.5 06/28/2018   Lab Results  Component Value Date   CREATININE 1.88 (H) 06/28/2018   Estimated Creatinine Clearance: 26.9 mL/min (A) (by C-G formula based on SCr of 1.88 mg/dL (H)).   Physical Exam Vitals signs and nursing note reviewed.  Constitutional:      Appearance: He is well-developed. He is not ill-appearing or diaphoretic.  Eyes:     Conjunctiva/sclera: Conjunctivae normal.     Pupils: Pupils are equal, round, and reactive to light.  Cardiovascular:     Rate and Rhythm: Normal rate.  Pulmonary:     Effort: Pulmonary effort is normal.  Abdominal:     General: There is no distension.  Musculoskeletal: Normal range of  motion.       Hands:  Skin:    General: Skin is warm and dry.  Neurological:     Mental Status: He is alert and oriented to person, place, and time.     Cranial Nerves: No cranial nerve deficit.     Coordination: Coordination normal.     No results found for this or any previous visit (from the past 72  hour(s)).  No results found.  ASSESSMENT AND PLAN:   Acute gout of left hand, unspecified cause: Versus early cellulitis. Will cover with lower end dosing keflex given CKD.      Discharge Instructions     Start the meds today.  Come back if your not getting better in a few days. Stay away from sick people.         The patient is advised to call or return to clinic if he does not see an improvement in symptoms, or to seek the care of the closest emergency department if he worsens with the above plan.   Philis Fendt, MHS, PA-C 07/03/2018 2:45 PM   Tereasa Coop, PA-C 07/03/18 1445

## 2018-07-03 NOTE — ED Triage Notes (Signed)
Pt presets today with left hand pain and swelling. States he woke up like that and thinks something bit his hand. He is not sure what it could have been.

## 2018-07-05 ENCOUNTER — Telehealth: Payer: Self-pay | Admitting: *Deleted

## 2018-07-05 NOTE — Telephone Encounter (Signed)
.  left message to have patient return my call.  

## 2018-07-05 NOTE — Telephone Encounter (Signed)
Patient called requesting referral to a Hand Specialist. Stated that he was seen at the [facility] on Saturday due to his hand being swollen from possible insect bite, No better. They gave him an antibiotic, Keflex,  and he has been taking it. Stated No Itch, No redness, No warmth to touch, No pain just swollen. Wants referral to be placed. Please Advise.

## 2018-07-05 NOTE — Telephone Encounter (Signed)
It looks like they felt he had acute gout, most likely in his hand.  He should also be taking the prednisone that was prescribed.  I recommend he complete his medications before he see any specialists.  He is at high risk for infection and should avoid going out in public and stay home at this time to prevent covid-19

## 2018-07-08 NOTE — Telephone Encounter (Signed)
.  left message to have patient return my call.  

## 2018-08-02 ENCOUNTER — Encounter (HOSPITAL_COMMUNITY): Payer: Self-pay

## 2018-08-02 ENCOUNTER — Ambulatory Visit (HOSPITAL_COMMUNITY)
Admission: EM | Admit: 2018-08-02 | Discharge: 2018-08-02 | Disposition: A | Discharge status: Home / Self Care | Payer: Medicare HMO | Attending: Family Medicine | Admitting: Family Medicine

## 2018-08-02 ENCOUNTER — Ambulatory Visit (INDEPENDENT_AMBULATORY_CARE_PROVIDER_SITE_OTHER): Payer: Medicare HMO

## 2018-08-02 ENCOUNTER — Other Ambulatory Visit: Payer: Self-pay

## 2018-08-02 DIAGNOSIS — M25532 Pain in left wrist: Secondary | ICD-10-CM

## 2018-08-02 DIAGNOSIS — S63512A Sprain of carpal joint of left wrist, initial encounter: Secondary | ICD-10-CM | POA: Diagnosis not present

## 2018-08-02 MED ORDER — PREDNISONE 5 MG PO TABS: ORAL_TABLET | ORAL | 0 refills | Status: DC

## 2018-08-02 NOTE — ED Notes (Signed)
Patient verbalizes understanding of discharge instructions. Opportunity for questioning and answers were provided. Patient discharged from [facility] by MD. 

## 2018-08-02 NOTE — ED Provider Notes (Signed)
[place]    CSN: [id number] Arrival date & time: 08/02/18  1119     History   Chief Complaint Chief Complaint  Patient presents with   Gout    HPI Kevin Schwartz is a 85 y.o. male. He is presenting with left dorsal wrist pain.  Having some swelling and redness.  Pain started as of yesterday.  He has a history of gout.  He has a history of a broken wrist about 20 years ago.  Denies any inciting event or trauma.  Has not tried anything for the pain.  He is reports the pain does not feel like his regular gout attack.  Pain is worse with any form of movement.  Pain is constant.  Pain is severe in nature.  Denies any radiation.  No new or different medications.  HPI  Past Medical History:  Diagnosis Date   Anemia due to chronic kidney disease    Bilateral inguinal hernia    CAD (coronary artery disease) cardiologist-  dr bensimhon   PTCA of OM in 1998, [place] in 2000, Cath 9/07 LM ok LAD ok. LCX 95% in OM prior to previous stent RCA. nondominant normal EF normal. Cypher DES to OM 2007 (PLACED PROXIAMAL TO PREVIOUS STENT)   Chronic kidney disease, stage III (moderate) ([place])    nephrologist-  dr detarding   Depression    Diabetes mellitus type 2, diet-controlled ([place])    followed by pcp,  last A1c 5.2 on 09-10-2017 in epic   Gout, unspecified    10-29-2017  per pt stable   HLD (hyperlipidemia)    HTN (hypertension)    MGUS (monoclonal gammopathy of unknown significance) previously followed by dr Beryle Beams , [place] and released 08/01/2011   IgG kappa dx 2002 8% plasma cells in bone marrow; no lesions on bone X-rays;   Nocturia    OA (osteoarthritis)    OSA on CPAP    per study 01-30-2004  severe osa , AHI 51.6/hr   PAD (peripheral artery disease) ([place])    05-21-2006  left renal artery stenosis, s/p balloon angioplasty and stenting;  last duplex 02/ 2012  normal , arteries patent   S/P coronary artery stent placement    2000--  BMS x1  to OM;    2007-- DES x1  to OM proximal to previous stent   Secondary hyperparathyroidism of renal origin [facility])    Urgency of urination    Wears glasses     Patient Active Problem List   Diagnosis Date Noted   Bilateral inguinal hernia s/p lap hernia repair w mesh 10/30/2017 10/30/2017   Left femoral hernia s/p lap hernia repair w mesh 10/30/2017 10/30/2017   Loss of weight 01/21/2016   Heme positive stool 01/21/2016   Heart murmur 08/18/2013   Chronic kidney disease, stage III (moderate) ([place]) 07/14/2013   Hyperlipidemia LDL goal < 100 07/14/2013   Essential hypertension, benign 07/14/2013   Type II or unspecified type diabetes mellitus with renal manifestations, not stated as uncontrolled(250.40) 07/14/2013   Insomnia 07/14/2013   Angioedema of lips 12/26/2012   MGUS (monoclonal gammopathy of unknown significance) 08/01/2011   Gout 08/01/2011   S/P coronary artery stent placement 08/01/2011   DM type 2 causing CKD stage 2 ([place]) 08/01/2011   Bradycardia 10/15/2010   Mixed hyperlipidemia due to type 2 diabetes mellitus ([place]) 05/01/2010   HYPERTENSION, UNSPECIFIED 05/01/2010   CAD, NATIVE VESSEL 05/01/2010   PVD 05/01/2010    Past Surgical History:  Procedure  Laterality Date   CARDIOVASCULAR STRESS TEST  2010   per dr bensimhon epic note dated 04-18-2016  normal   CORONARY ANGIOPLASTY  1998   PTCA to [place]   PCI and BMS x1 OM   CORONARY ANGIOPLASTY WITH STENT PLACEMENT  12-18-2005  dr Claiborne Billings   DES x1 to proximal to previously placed stent in [place] Bilateral 10/30/2017   Procedure: [place];  Surgeon: Michael Boston, MD;  Location: [facility];  Service: General;  Laterality: Bilateral;   INSERTION OF MESH Bilateral 10/30/2017   Procedure: INSERTION OF MESH;  Surgeon: Michael Boston, MD;   Location: [place];  Service: General;  Laterality: Bilateral;   RENAL ANGIOPLASTY Left 05-21-2006   dr berry   and stenting   TOTAL KNEE ARTHROPLASTY Left 07-22-2005   dr Shellia Carwin  [facility]   TRANSTHORACIC ECHOCARDIOGRAM  04/18/2016   ef 73-22%, grade 1 diastolic dysfunction/  mild to moderate AR/  mild MR       Home Medications    Prior to Admission medications   Medication Sig Start Date End Date Taking? Authorizing Provider  ACCU-CHEK SOFTCLIX LANCETS lancets Use to test blood sugar daily. Dx:E11.8 03/25/17   Reed, Tiffany L, DO  Alcohol Swabs (B-D SINGLE USE SWABS REGULAR) PADS Use with testing of blood sugar. Dx:E11.8 03/25/17   Reed, Tiffany L, DO  allopurinol (ZYLOPRIM) 100 MG tablet Take 1 tablet (100 mg total) by mouth daily. 06/11/18   Ngetich, Dinah C, NP  amLODipine (NORVASC) 5 MG tablet Take 1 tablet (5 mg total) by mouth daily. 06/11/18   Ngetich, Dinah C, NP  aspirin EC 81 MG tablet Take 81 mg by mouth daily.    [provider]  atorvastatin (LIPITOR) 80 MG tablet Take 1 tablet (80 mg total) by mouth daily. 06/11/18   Ngetich, Dinah C, NP  cephALEXin (KEFLEX) 250 MG capsule Take 1 capsule (250 mg total) by mouth 3 (three) times daily. 07/03/18   Tereasa Coop, PA-C  citalopram (CELEXA) 20 MG tablet Take 1 tablet (20 mg total) by mouth daily. 06/11/18   Ngetich, Dinah C, NP  glucose blood (ACCU-CHEK AVIVA PLUS) test strip Use to check blood sugar daily. Dx:E11.8 03/25/17   Reed, Tiffany L, DO  metoprolol tartrate (LOPRESSOR) 50 MG tablet Take 1 tablet (50 mg total) by mouth 2 (two) times daily. 06/11/18   Ngetich, Dinah C, NP  nitroGLYCERIN (NITROSTAT) 0.4 MG SL tablet Place 0.4 mg under the tongue every 5 (five) minutes as needed for chest pain. Up to 3 doses    [provider]  predniSONE (DELTASONE) 5 MG tablet Take 6 pills for the first 4 days, 5 pills on day 5, 4 pills day 6, 3 pills day 7, 2 pills the day 8, and 1 pill day 9. 08/02/18    Rosemarie Ax, MD  ULTRA-THIN II MINI PEN NEEDLE 31G X 5 MM MISC  08/29/14   [provider]    Family History Family History  Problem Relation Age of Onset   Heart disease Mother    Heart disease Sister    Heart disease Sister    Cancer Sister        type unknown   Hypertension Sister    Diabetes Brother    Diabetes Brother    Diabetes  Other    Coronary artery disease Other    Cancer Other     Social History Social History   Tobacco Use   Smoking status: Former Smoker    Years: 54.00    Types: Cigarettes    Last attempt to quit: 09/29/2017    Years since quitting: 0.8   Smokeless tobacco: Never Used   Tobacco comment: 10-29-2017  per pt was smoking 1pp2d, stopped June 2019  Substance Use Topics   Alcohol use: Not Currently   Drug use: No     Allergies   Lisinopril; Losartan potassium; and Crestor [rosuvastatin]   Review of Systems Review of Systems  Constitutional: Negative for fever.  HENT: Negative for congestion.   Respiratory: Negative for cough.   Cardiovascular: Negative for chest pain.  Gastrointestinal: Negative for abdominal pain.  Musculoskeletal: Positive for arthralgias and joint swelling.  Skin: Positive for color change.  Neurological: Negative for weakness.  Hematological: Negative for adenopathy.     Physical Exam Triage Vital Signs ED Triage Vitals  Enc Vitals Group     BP 08/02/18 1133 140/71     Pulse Rate 08/02/18 1133 82     Resp 08/02/18 1133 17     Temp 08/02/18 1133 98.3 F (36.8 C)     Temp Source 08/02/18 1133 Oral     SpO2 08/02/18 1133 98 %     Weight --      Height --      Head Circumference --      Peak Flow --      Pain Score 08/02/18 1132 9     Pain Loc --      Pain Edu? --      Excl. in [place]? --    No data found.  Updated Vital Signs BP 140/71 (BP Location: Right Arm)    Pulse 82    Temp 98.3 F (36.8 C) (Oral)    Resp 17    SpO2 98%   Visual Acuity Right Eye Distance:   Left  Eye Distance:   Bilateral Distance:    Right Eye Near:   Left Eye Near:    Bilateral Near:     Physical Exam Gen: NAD, alert, cooperative with exam,  ENT: normal lips, normal nasal mucosa,  Eye: normal EOM, normal conjunctiva and lids CV:  no edema, +2 pedal pulses   Resp: no accessory muscle use, non-labored,  Skin: no rashes, no areas of induration  Neuro: normal tone, normal sensation to touch Psych:  normal insight, alert and oriented MSK:  Left wrist:  Swollen and redness dorsal left wrist Pain with flexion and extension. Tenderness to palpation of the distal radius and ulna as well as the dorsal carpal bones. No swelling of the dorsal hand. Normal elbow range of motion. Normal grip strength. Normal strength resistance with finger abduction abduction. Neurovascularly intact.  UC Treatments / Results  Labs (all labs ordered are listed, but only abnormal results are displayed) Labs Reviewed - No data to display  EKG None  Radiology Dg Wrist Complete Left  Result Date: 08/02/2018 CLINICAL DATA:  Pain and swelling.  Reported history of gout EXAM: LEFT WRIST - COMPLETE 3+ VIEW COMPARISON:  Left hand radiographs June 21, 2012 FINDINGS: Frontal, oblique, lateral, and ulnar deviation scaphoid images were obtained. There is no acute fracture or dislocation. There is widening at the scapholunate junction consistent with scapholunate disassociation. There is marked osteoarthritic change in the first carpal-metacarpal joint with moderately severe osteoarthritic change in  the scaphotrapezial joint. There is moderate narrowing in the proximal radiocarpal joint. There is no erosive change. There is slight calcification in the medial triangular fibrocartilage region. IMPRESSION: 1.  Scapholunate disassociation. 2.  No acute fracture or dislocation. 3. Marked osteoarthritic change in the first carpal-metacarpal joint. Moderately severe osteoarthritic change in the scaphotrapezial joint.  Moderate osteoarthritic change in the the proximal radiocarpal joint. 4.  No erosive change. 5. Mild calcification in the medial triangular fibrocartilage region. Question arthropathic etiology versus residual of prior tear in this region. Electronically Signed   By: Lowella Grip III M.D.   On: 08/02/2018 11:56    Procedures Procedures (including critical care time)  Medications Ordered in UC Medications - No data to display  Initial Impression / Assessment and Plan / UC Course  I have reviewed the triage vital signs and the nursing notes.  Pertinent labs & imaging results that were available during my care of the patient were reviewed by me and considered in my medical decision making (see chart for details).     Mr. Buchberger is an 85 year old male that is presenting with left wrist pain.  Likely an exacerbation of his underlying gout.  X-ray was showing degenerative or chronic changes but no acute changes.  Less likely for infection.  Counseled on the use of prednisone.  Can follow-up for the consideration of obtaining a uric acid and localized injection if no improvement with oral prednisone.  Counseled on supportive care.  Given indications to return to follow-up. Final Clinical Impressions(s) / UC Diagnoses   Final diagnoses:  Left wrist pain     Discharge Instructions     Please try the medicine.  Please follow up if your symptoms worsen or change  Please practice social distancing.     ED Prescriptions    Medication Sig Dispense Auth. Provider   predniSONE (DELTASONE) 5 MG tablet Take 6 pills for the first 4 days, 5 pills on day 5, 4 pills day 6, 3 pills day 7, 2 pills the day 8, and 1 pill day 9. 39 tablet Rosemarie Ax, MD     Controlled Substance Prescriptions [place] Controlled Substance Registry consulted? Not Applicable   Rosemarie Ax, MD 08/02/18 1240

## 2018-08-02 NOTE — Discharge Instructions (Signed)
Please try the medicine.  Please follow up if your symptoms worsen or change  Please practice social distancing.

## 2018-08-02 NOTE — ED Triage Notes (Signed)
Patient presents to Urgent Care with complaints of left wrist pain and swelling since yesterday. Patient states he has not eaten anything fried, red meat, or alcohol recently. Pt has hx of gout.

## 2018-09-13 ENCOUNTER — Other Ambulatory Visit: Payer: Self-pay | Admitting: *Deleted

## 2018-09-13 MED ORDER — ATORVASTATIN CALCIUM 80 MG PO TABS: 80.0000 mg | ORAL_TABLET | Freq: Every day | ORAL | 1 refills | Status: DC

## 2018-09-13 NOTE — Telephone Encounter (Signed)
Received fax from [facility] Requesting refill for Atorvastatin.  Pended and sent to Dr. Mariea Clonts for approval due to [place].

## 2018-09-16 DIAGNOSIS — H5203 Hypermetropia, bilateral: Secondary | ICD-10-CM | POA: Diagnosis not present

## 2018-09-16 DIAGNOSIS — E119 Type 2 diabetes mellitus without complications: Secondary | ICD-10-CM | POA: Diagnosis not present

## 2018-09-16 DIAGNOSIS — H25013 Cortical age-related cataract, bilateral: Secondary | ICD-10-CM | POA: Diagnosis not present

## 2018-09-16 DIAGNOSIS — H353121 Nonexudative age-related macular degeneration, left eye, early dry stage: Secondary | ICD-10-CM | POA: Diagnosis not present

## 2018-09-16 LAB — HM DIABETES EYE EXAM

## 2018-09-17 ENCOUNTER — Encounter: Payer: Self-pay | Admitting: *Deleted

## 2018-11-07 ENCOUNTER — Emergency Department (HOSPITAL_COMMUNITY)
Admission: EM | Admit: 2018-11-07 | Discharge: 2018-11-08 | Disposition: A | Discharge status: Home / Self Care | Payer: Medicare HMO | Attending: Emergency Medicine | Admitting: Emergency Medicine

## 2018-11-07 ENCOUNTER — Encounter (HOSPITAL_COMMUNITY): Payer: Self-pay | Admitting: Emergency Medicine

## 2018-11-07 ENCOUNTER — Emergency Department (HOSPITAL_COMMUNITY): Payer: Medicare HMO

## 2018-11-07 ENCOUNTER — Other Ambulatory Visit: Payer: Self-pay

## 2018-11-07 DIAGNOSIS — E1122 Type 2 diabetes mellitus with diabetic chronic kidney disease: Secondary | ICD-10-CM | POA: Diagnosis not present

## 2018-11-07 DIAGNOSIS — I129 Hypertensive chronic kidney disease with stage 1 through stage 4 chronic kidney disease, or unspecified chronic kidney disease: Secondary | ICD-10-CM | POA: Insufficient documentation

## 2018-11-07 DIAGNOSIS — I259 Chronic ischemic heart disease, unspecified: Secondary | ICD-10-CM | POA: Diagnosis not present

## 2018-11-07 DIAGNOSIS — Z96652 Presence of left artificial knee joint: Secondary | ICD-10-CM | POA: Insufficient documentation

## 2018-11-07 DIAGNOSIS — M899 Disorder of bone, unspecified: Secondary | ICD-10-CM | POA: Insufficient documentation

## 2018-11-07 DIAGNOSIS — M545 Low back pain: Secondary | ICD-10-CM | POA: Diagnosis present

## 2018-11-07 DIAGNOSIS — N183 Chronic kidney disease, stage 3 (moderate): Secondary | ICD-10-CM | POA: Diagnosis not present

## 2018-11-07 DIAGNOSIS — Z955 Presence of coronary angioplasty implant and graft: Secondary | ICD-10-CM | POA: Insufficient documentation

## 2018-11-07 DIAGNOSIS — M898X5 Other specified disorders of bone, thigh: Secondary | ICD-10-CM | POA: Diagnosis not present

## 2018-11-07 DIAGNOSIS — M25551 Pain in right hip: Secondary | ICD-10-CM | POA: Diagnosis not present

## 2018-11-07 DIAGNOSIS — Z87891 Personal history of nicotine dependence: Secondary | ICD-10-CM | POA: Insufficient documentation

## 2018-11-07 LAB — URINALYSIS, ROUTINE W REFLEX MICROSCOPIC
Bacteria, UA: NONE SEEN
Bilirubin Urine: NEGATIVE
Glucose, UA: NEGATIVE mg/dL
Hgb urine dipstick: NEGATIVE
Ketones, ur: NEGATIVE mg/dL
Leukocytes,Ua: NEGATIVE
Nitrite: NEGATIVE
Protein, ur: 100 mg/dL — AB
Specific Gravity, Urine: 1.008 (ref 1.005–1.030)
pH: 6 (ref 5.0–8.0)

## 2018-11-07 MED ORDER — KETOROLAC TROMETHAMINE 60 MG/2ML IM SOLN
30.0000 mg | Freq: Once | INTRAMUSCULAR | Status: AC
  Administered 2018-11-07: 23:00:00 30 mg via INTRAMUSCULAR
  Filled 2018-11-07: qty 2

## 2018-11-07 NOTE — ED Triage Notes (Signed)
Patient here from home with complaints of right lower back pain nonradiating. Denies difficulty urinating. Denies urinary symptoms.

## 2018-11-07 NOTE — ED Provider Notes (Signed)
Emergency Department Provider Note   I have reviewed the triage vital signs and the nursing notes.   HISTORY  Chief Complaint Back Pain   HPI Kevin Schwartz is a 85 y.o. male who presents the emergency department with atraumatic right hip pain.  Patient states that he started having severe pain in his couple days ago worse with standing and straightening his leg seems to be in his right side of his back around to his right groin may be down the right femur a little bit.  Better with rest.  No history of the same.  No trauma.  No recent fevers or recent illnesses.  No IV drug use but does have a history of diabetes.   No other associated or modifying symptoms.    Past Medical History:  Diagnosis Date  . Anemia due to chronic kidney disease   . Bilateral inguinal hernia   . CAD (coronary artery disease) cardiologist-  dr bensimhon   PTCA of OM in 1998, [place] OM in 2000, Cath 9/07 LM ok LAD ok. LCX 95% in OM prior to previous stent RCA. nondominant normal EF normal. Cypher DES to OM 2007 (PLACED PROXIAMAL TO PREVIOUS STENT)  . Chronic kidney disease, stage III (moderate) [facility])    nephrologist-  dr detarding  . Depression   . Diabetes mellitus type 2, diet-controlled ([place])    followed by pcp,  last A1c 5.2 on 09-10-2017 in epic  . Gout, unspecified    10-29-2017  per pt stable  . HLD (hyperlipidemia)   . HTN (hypertension)   . MGUS (monoclonal gammopathy of unknown significance) previously followed by dr Beryle Beams , [place] and released 08/01/2011   IgG kappa dx 2002 8% plasma cells in bone marrow; no lesions on bone X-rays;  . Nocturia   . OA (osteoarthritis)   . OSA on CPAP    per study 01-30-2004  severe osa , AHI 51.6/hr  . PAD (peripheral artery disease) ([place])    05-21-2006  left renal artery stenosis, s/p balloon angioplasty and stenting;  last duplex 02/ 2012  normal , arteries patent  . S/P coronary artery stent placement    2000--  BMS x1  to OM;   2007-- DES x1   to OM proximal to previous stent  . Secondary hyperparathyroidism of renal origin ([place])   . Urgency of urination   . Wears glasses     Patient Active Problem List   Diagnosis Date Noted  . Bilateral inguinal hernia s/p lap hernia repair w mesh 10/30/2017 10/30/2017  . Left femoral hernia s/p lap hernia repair w mesh 10/30/2017 10/30/2017  . Loss of weight 01/21/2016  . Heme positive stool 01/21/2016  . Heart murmur 08/18/2013  . Chronic kidney disease, stage III (moderate) ([place]) 07/14/2013  . Hyperlipidemia LDL goal < 100 07/14/2013  . Essential hypertension, benign 07/14/2013  . Type II or unspecified type diabetes mellitus with renal manifestations, not stated as uncontrolled(250.40) 07/14/2013  . Insomnia 07/14/2013  . Angioedema of lips 12/26/2012  . MGUS (monoclonal gammopathy of unknown significance) 08/01/2011  . Gout 08/01/2011  . S/P coronary artery stent placement 08/01/2011  . DM type 2 causing CKD stage 2 ([place]) 08/01/2011  . Bradycardia 10/15/2010  . Mixed hyperlipidemia due to type 2 diabetes mellitus ([place]) 05/01/2010  . HYPERTENSION, UNSPECIFIED 05/01/2010  . CAD, NATIVE VESSEL 05/01/2010  . PVD 05/01/2010    Past Surgical History:  Procedure Laterality Date  . CARDIOVASCULAR STRESS TEST  2010  per dr bensimhon epic note dated 04-18-2016  normal  . [place]   PTCA to OM  . CORONARY ANGIOPLASTY WITH STENT PLACEMENT  2000   PCI and BMS x1 OM  . CORONARY ANGIOPLASTY WITH STENT PLACEMENT  12-18-2005  dr Claiborne Billings   DES x1 to proximal to previously placed stent in [place]  . HERNIA REPAIR    . INGUINAL HERNIA REPAIR Bilateral 10/30/2017   Procedure: LAPAROSCOPIC  BILATERAL INGUINAL HERNIA REPAIR  AND LEFT [place];  Surgeon: Michael Boston, MD;  Location: [place];  Service: General;  Laterality: Bilateral;  . INSERTION OF MESH Bilateral 10/30/2017   Procedure: INSERTION OF MESH;  Surgeon: Michael Boston, MD;  Location: [place];  Service: General;  Laterality: Bilateral;  . RENAL ANGIOPLASTY Left 05-21-2006   dr berry   and stenting  . TOTAL KNEE ARTHROPLASTY Left 07-22-2005   dr Shellia Carwin  [facility]  . TRANSTHORACIC ECHOCARDIOGRAM  04/18/2016   ef 00-93%, grade 1 diastolic dysfunction/  mild to moderate AR/  mild MR    Current Outpatient Rx  . Order #: [id number] Class: Normal  . Order #: [id number] Class: Normal  . Order #: [id number] Class: Normal  . Order #: [id number] Class: Normal  . Order #: [id number] Class: Normal  . Order #: [id number] Class: Historical Med  . Order #: [id number] Class: Normal  . Order #: [id number] Class: Normal  . Order #: [id number] Class: Normal  . Order #: [id number] Class: Normal  . Order #: [id number] Class: Normal  . Order #: [id number] Class: Normal  . Order #: [id number] Class: Historical Med  . Order #: [id number] Class: Normal  . Order #: [id number] Class: Normal  . Order #: [id number] Class: Historical Med    Allergies Lisinopril, Losartan potassium, and Crestor [rosuvastatin]  Family History  Problem Relation Age of Onset  . Heart disease Mother   . Heart disease Sister   . Heart disease Sister   . Cancer Sister        type unknown  . Hypertension Sister   . Diabetes Brother   . Diabetes Brother   . Diabetes Other   . Coronary artery disease Other   . Cancer Other     Social History Social History   Tobacco Use  . Smoking status: Former Smoker    Years: 54.00    Types: Cigarettes    Quit date: 09/29/2017    Years since quitting: 1.1  . Smokeless tobacco: Never Used  . Tobacco comment: 10-29-2017  per pt was smoking 1pp2d, stopped June 2019  Substance Use Topics  . Alcohol use: Not Currently  . Drug use: No    Review of Systems  All other systems negative except as documented in the HPI. All pertinent positives and negatives as reviewed in the HPI. ____________________________________________   PHYSICAL EXAM:  VITAL SIGNS: ED Triage Vitals  Enc  Vitals Group     BP 11/07/18 2010 (!) 170/106     Pulse Rate 11/07/18 2010 60     Resp 11/07/18 2010 20     Temp 11/07/18 2010 98 F (36.7 C)     Temp Source 11/07/18 2010 Oral     SpO2 11/07/18 2010 100 %     Weight --      Height --      Head Circumference --      Peak Flow --      Pain Score 11/07/18 2011 10     Pain Loc --      Pain Edu? --  Excl. in [place]? --     Constitutional: Alert and oriented. Well appearing and in no acute distress. Eyes: Conjunctivae are normal. PERRL. EOMI. Head: Atraumatic. Nose: No congestion/rhinnorhea. Mouth/Throat: Mucous membranes are moist.  Oropharynx non-erythematous. Neck: No stridor.  No meningeal signs.   Cardiovascular: Normal rate, regular rhythm. Good peripheral circulation. Grossly normal heart sounds.   Respiratory: Normal respiratory effort.  No retractions. Lungs CTAB. Gastrointestinal: Soft and nontender. No distention.  Musculoskeletal: With axial loading of right leg and range of motion of right hip also with AP compression of right hip. Neurologic:  Normal speech and language. No gross focal neurologic deficits are appreciated.  Skin:  Skin is warm, dry and intact. No rash noted.   ____________________________________________   LABS (all labs ordered are listed, but only abnormal results are displayed)  Labs Reviewed  URINALYSIS, ROUTINE W REFLEX MICROSCOPIC - Abnormal; Notable for the following components:      Result Value   Color, Urine STRAW (*)    Protein, ur 100 (*)    All other components within normal limits   ____________________________________________  RADIOLOGY  Dg Hip Unilat W Or Wo Pelvis 2-3 Views Right  Result Date: 11/07/2018 CLINICAL DATA:  Initial evaluation for acute severe right hip pain. EXAM: DG HIP (WITH OR WITHOUT PELVIS) 2-3V RIGHT COMPARISON:  None available. FINDINGS: No acute fracture dislocation. Femoral heads in normal alignment within the acetabula. Femoral head height maintained.  Bony pelvis intact. SI joints approximated. Question of a focal lucent lesion measuring approximately 13 mm at the posterior right femoral neck, seen on oblique view only. No other discrete osseous lesions identified. Visualized soft tissues demonstrate no acute finding. Atherosclerotic change noted within the proximal thighs. Degenerative changes noted within the lower lumbar spine. IMPRESSION: 1. No acute osseous abnormality about the right hip. 2. Question of focal lucent lesion measuring approximately 13 mm at the posterior right femoral neck, indeterminate. Differential considerations include osseous metastasis versus myeloma. Finding could be further assessed with dedicated MRI and/or bone scan as clinically warranted. Electronically Signed   By: Jeannine Boga M.D.   On: 11/07/2018 23:50    ____________________________________________   PROCEDURES  Procedure(s) performed:   Procedures   ____________________________________________   INITIAL IMPRESSION / ASSESSMENT AND PLAN / ED COURSE  Patient with a history MGUS and atraumatic right hip pain so x-ray done showing a questionable lytic lesion (multiple myeloma?) likely needs a follow-up MRI.  Message sent in epic to his PCP.  It does not appear that his oncologist has an epic account so we will rely on PCP for further work-up of the lytic lesion.  The family will call in the morning. Pain meds provided.  Pertinent labs & imaging results that were available during my care of the patient were reviewed by me and considered in my medical decision making (see chart for details).  A medical screening exam was performed and I feel the patient has had an appropriate workup for their chief complaint at this time and likelihood of emergent condition existing is low. They have been counseled on decision, discharge, follow up and which symptoms necessitate immediate return to the emergency department. They or their family verbally stated  understanding and agreement with plan and discharged in stable condition.   ____________________________________________  FINAL CLINICAL IMPRESSION(S) / ED DIAGNOSES  Final diagnoses:  Lytic bone lesion of right femur     MEDICATIONS GIVEN DURING THIS VISIT:  Medications  ketorolac (TORADOL) injection 30 mg (30 mg Intramuscular Given  11/07/18 2324)     NEW OUTPATIENT MEDICATIONS STARTED DURING THIS VISIT:  Discharge Medication List as of 11/08/2018  1:07 AM    START taking these medications   Details  acetaminophen (TYLENOL) 325 MG tablet Take 1 tablet (325 mg total) by mouth every 8 (eight) hours as needed., Starting Mon 11/08/2018, Normal    docusate sodium (COLACE) 100 MG capsule Take 1 capsule (100 mg total) by mouth every 12 (twelve) hours., Starting Mon 11/08/2018, Normal    oxyCODONE-acetaminophen (PERCOCET) 5-325 MG tablet Take 1 tablet by mouth every 8 (eight) hours as needed for severe pain (if regular tylenol not controlling pain)., Starting Mon 11/08/2018, Normal        Note:  This note was prepared with assistance of Dragon voice recognition software. Occasional wrong-word or sound-a-like substitutions may have occurred due to the inherent limitations of voice recognition software.   Mesner, Corene Cornea, MD 11/08/18 [id number]

## 2018-11-08 ENCOUNTER — Ambulatory Visit (INDEPENDENT_AMBULATORY_CARE_PROVIDER_SITE_OTHER): Payer: Medicare HMO | Admitting: Nurse Practitioner

## 2018-11-08 ENCOUNTER — Encounter: Payer: Self-pay | Admitting: Nurse Practitioner

## 2018-11-08 ENCOUNTER — Other Ambulatory Visit: Payer: Self-pay | Admitting: Internal Medicine

## 2018-11-08 ENCOUNTER — Other Ambulatory Visit: Payer: Self-pay

## 2018-11-08 ENCOUNTER — Telehealth: Payer: Self-pay | Admitting: *Deleted

## 2018-11-08 VITALS — BP 130/58 | HR 49 | Temp 98.1°F | Ht 67.0 in | Wt 167.8 lb

## 2018-11-08 DIAGNOSIS — D472 Monoclonal gammopathy: Secondary | ICD-10-CM

## 2018-11-08 DIAGNOSIS — M898X5 Other specified disorders of bone, thigh: Secondary | ICD-10-CM

## 2018-11-08 MED ORDER — DOCUSATE SODIUM 100 MG PO CAPS: 100.0000 mg | ORAL_CAPSULE | Freq: Two times a day (BID) | ORAL | 0 refills | Status: DC

## 2018-11-08 MED ORDER — ACETAMINOPHEN 325 MG PO TABS: 325.0000 mg | ORAL_TABLET | Freq: Three times a day (TID) | ORAL | 0 refills | Status: DC | PRN

## 2018-11-08 MED ORDER — OXYCODONE-ACETAMINOPHEN 5-325 MG PO TABS: 1.0000 | ORAL_TABLET | Freq: Three times a day (TID) | ORAL | 0 refills | Status: DC | PRN

## 2018-11-08 NOTE — Telephone Encounter (Signed)
Patient in office for an appointment with Janett Billow today.

## 2018-11-08 NOTE — Telephone Encounter (Signed)
Patient called and Left message on Clinical Intake to return his call.   Tried calling patient back. LMOM to return call.

## 2018-11-08 NOTE — Progress Notes (Signed)
Careteam: Patient Care Team: Gayland Curry, DO as PCP - General (Geriatric Medicine) Haroldine Laws, Shaune Pascal, MD as Consulting Physician (Cardiology) Penninger, Ria Comment, [place] as Physician Assistant (Nephrology) Donato Heinz, MD as Consulting Physician (Nephrology) Michael Boston, MD as Consulting Physician (General Surgery) Ralene Bathe, MD as Consulting Physician (Ophthalmology)  Advanced Directive information    Allergies  Allergen Reactions   Lisinopril Swelling    angioedema   Losartan Potassium Swelling    Angioedema    Crestor [Rosuvastatin] Swelling    angioedema    Chief Complaint  Patient presents with   Acute Visit    Right Hip Lesion      HPI: Patient is a 85 y.o. male seen in the office today to follow up ED visit. Went to the emergency department with atraumatic right hip pain.  Patient states that he started having severe pain in his couple days ago worse with standing and straightening his leg seems to be in his right side of his back around to his right groin may be down the right femur a little bit. Patient with a history MGUS and atraumatic right hip pain so x-ray done showing a questionable lytic lesion (multiple myeloma?) and recommended a follow-up MRI.   Pt with hx of MGUS and states he has not followed up with oncologist in 6 or 7 years.  Pt was started on oxycodone and tylenol for pain Just started today which has helped the pain. Pain currently 8/10; got a shot last night in the ED which was very helpful until this morning when he needed to go pick up the oxycodone. He is due for another dose at this time.   Pain makes it difficult for him to walk.   Review of Systems:  Review of Systems  Constitutional: Negative for chills and fever.  Musculoskeletal: Positive for back pain, joint pain and myalgias. Negative for falls.  Neurological: Positive for sensory change and weakness.    Past Medical History:  Diagnosis Date   Anemia due  to chronic kidney disease    Bilateral inguinal hernia    CAD (coronary artery disease) cardiologist-  dr bensimhon   PTCA of OM in 1998, [place] in 2000, Cath 9/07 LM ok LAD ok. LCX 95% in [place] prior to previous stent RCA. nondominant normal EF normal. [place] DES to OM 2007 (PLACED PROXIAMAL TO PREVIOUS STENT)   Chronic kidney disease, stage III (moderate) ([place])    nephrologist-  dr detarding   Depression    Diabetes mellitus type 2, diet-controlled ([place])    followed by pcp,  last A1c 5.2 on 09-10-2017 in epic   Gout, unspecified    10-29-2017  per pt stable   HLD (hyperlipidemia)    HTN (hypertension)    MGUS (monoclonal gammopathy of unknown significance) previously followed by dr Beryle Beams , [place] and released 08/01/2011   IgG kappa dx 2002 8% plasma cells in bone marrow; no lesions on bone X-rays;   Nocturia    OA (osteoarthritis)    OSA on CPAP    per study 01-30-2004  severe osa , AHI 51.6/hr   PAD (peripheral artery disease) ([place])    05-21-2006  left renal artery stenosis, s/p balloon angioplasty and stenting;  last duplex 02/ 2012  normal , arteries patent   S/P coronary artery stent placement    2000--  BMS x1  to OM;   2007-- DES x1  to OM proximal to previous stent   Secondary hyperparathyroidism of  renal origin [facility])    Urgency of urination    Wears glasses    Past Surgical History:  Procedure Laterality Date   CARDIOVASCULAR STRESS TEST  2010   per dr bensimhon epic note dated 04-18-2016  normal   CORONARY ANGIOPLASTY  1998   PTCA to [place]   PCI and BMS x1 OM   CORONARY ANGIOPLASTY WITH STENT PLACEMENT  12-18-2005  dr Claiborne Billings   DES x1 to proximal to previously placed stent in [place] Bilateral 10/30/2017   Procedure: [place];  Surgeon: Michael Boston, MD;  Location: [facility];   Service: General;  Laterality: Bilateral;   INSERTION OF MESH Bilateral 10/30/2017   Procedure: INSERTION OF MESH;  Surgeon: Michael Boston, MD;  Location: [place];  Service: General;  Laterality: Bilateral;   RENAL ANGIOPLASTY Left 05-21-2006   dr berry   and stenting   TOTAL KNEE ARTHROPLASTY Left 07-22-2005   dr Shellia Carwin  [facility]   TRANSTHORACIC ECHOCARDIOGRAM  04/18/2016   ef 47-09%, grade 1 diastolic dysfunction/  mild to moderate AR/  mild MR   Social History:   reports that he quit smoking about 13 months ago. His smoking use included cigarettes. He quit after 54.00 years of use. He has never used smokeless tobacco. He reports previous alcohol use. He reports that he does not use drugs.  Family History  Problem Relation Age of Onset   Heart disease Mother    Heart disease Sister    Heart disease Sister    Cancer Sister        type unknown   Hypertension Sister    Diabetes Brother    Diabetes Brother    Diabetes Other    Coronary artery disease Other    Cancer Other     Medications: Patient's Medications  New Prescriptions   No medications on file  Previous Medications   ACCU-CHEK SOFTCLIX LANCETS LANCETS    Use to test blood sugar daily. Dx:E11.8   ACETAMINOPHEN (TYLENOL) 325 MG TABLET    Take 1 tablet (325 mg total) by mouth every 8 (eight) hours as needed.   ALCOHOL SWABS (B-D SINGLE USE SWABS REGULAR) PADS    Use with testing of blood sugar. Dx:E11.8   ALLOPURINOL (ZYLOPRIM) 100 MG TABLET    Take 1 tablet (100 mg total) by mouth daily.   AMLODIPINE (NORVASC) 5 MG TABLET    Take 1 tablet (5 mg total) by mouth daily.   ASPIRIN EC 81 MG TABLET    Take 81 mg by mouth daily.   ATORVASTATIN (LIPITOR) 80 MG TABLET    Take 1 tablet (80 mg total) by mouth daily.   CEPHALEXIN (KEFLEX) 250 MG CAPSULE    Take 1 capsule (250 mg total) by mouth 3 (three) times daily.   CITALOPRAM (CELEXA) 20 MG TABLET    Take 1 tablet (20 mg total) by mouth daily.    DOCUSATE SODIUM (COLACE) 100 MG CAPSULE    Take 1 capsule (100 mg total) by mouth every 12 (twelve) hours.   GLUCOSE BLOOD (ACCU-CHEK AVIVA PLUS) TEST STRIP    Use to check blood sugar daily. Dx:E11.8   METOPROLOL TARTRATE (LOPRESSOR) 50 MG TABLET    Take 1 tablet (50 mg total) by mouth 2 (two) times daily.   NITROGLYCERIN (NITROSTAT) 0.4 MG  SL TABLET    Place 0.4 mg under the tongue every 5 (five) minutes as needed for chest pain. Up to 3 doses   OXYCODONE-ACETAMINOPHEN (PERCOCET) 5-325 MG TABLET    Take 1 tablet by mouth every 8 (eight) hours as needed for severe pain (if regular tylenol not controlling pain).   PREDNISONE (DELTASONE) 5 MG TABLET    Take 6 pills for the first 4 days, 5 pills on day 5, 4 pills day 6, 3 pills day 7, 2 pills the day 8, and 1 pill day 9.   ULTRA-THIN II MINI PEN NEEDLE 31G X 5 MM MISC      Modified Medications   No medications on file  Discontinued Medications   No medications on file    Physical Exam:  There were no vitals filed for this visit. There is no height or weight on file to calculate BMI. Wt Readings from Last 3 Encounters:  06/28/18 171 lb (77.6 kg)  05/26/18 167 lb 6.4 oz (75.9 kg)  01/25/18 161 lb (73 kg)    Physical Exam Vitals signs reviewed.  Constitutional:      General: He is not in acute distress.    Appearance: Normal appearance. He is not toxic-appearing.  HENT:     Head: Normocephalic and atraumatic.  Cardiovascular:     Rate and Rhythm: Normal rate and regular rhythm.     Pulses: Normal pulses.     Heart sounds: Normal heart sounds.  Pulmonary:     Effort: Pulmonary effort is normal.     Breath sounds: Normal breath sounds.  Musculoskeletal: Normal range of motion.     Lumbar back: He exhibits tenderness.       Back:     Comments: Stooped shuffling posture  Skin:    General: Skin is warm and dry.     Capillary Refill: Capillary refill takes less than 2 seconds.  Neurological:     General: No focal deficit present.       Mental Status: He is alert and oriented to person, place, and time.     Cranial Nerves: No cranial nerve deficit.     Motor: Weakness present.     Gait: Gait abnormal.  Psychiatric:        Mood and Affect: Mood normal.     Labs reviewed: Basic Metabolic Panel: Recent Labs    01/25/18 1150 06/28/18 1557  NA 141 141  K 4.1 4.8  CL 109 111*  CO2 26 24  GLUCOSE 95 117  BUN 21 23  CREATININE 1.26* 1.88*  CALCIUM 9.2 9.2   Liver Function Tests: Recent Labs    06/28/18 1557  AST 20  ALT 15  BILITOT 0.4  PROT 7.0   No results for input(s): LIPASE, AMYLASE in the last 8760 hours. No results for input(s): AMMONIA in the last 8760 hours. CBC: Recent Labs    01/25/18 1150 06/28/18 1557  WBC 5.9 6.9  NEUTROABS 4,283 4,685  HGB 11.0* 11.8*  HCT 33.1* 34.7*  MCV 92.7 92.5  PLT 304 318   Lipid Panel: No results for input(s): CHOL, HDL, LDLCALC, TRIG, CHOLHDL, LDLDIRECT in the last 8760 hours. TSH: No results for input(s): TSH in the last 8760 hours. A1C: Lab Results  Component Value Date   HGBA1C 5.5 06/28/2018   Dg Hip Unilat W Or Wo Pelvis 2-3 Views Right  Result Date: 11/07/2018 CLINICAL DATA:  Initial evaluation for acute severe right hip pain. EXAM: DG HIP (WITH OR WITHOUT PELVIS)  2-3V RIGHT COMPARISON:  None available. FINDINGS: No acute fracture dislocation. Femoral heads in normal alignment within the acetabula. Femoral head height maintained. Bony pelvis intact. SI joints approximated. Question of a focal lucent lesion measuring approximately 13 mm at the posterior right femoral neck, seen on oblique view only. No other discrete osseous lesions identified. Visualized soft tissues demonstrate no acute finding. Atherosclerotic change noted within the proximal thighs. Degenerative changes noted within the lower lumbar spine. IMPRESSION: 1. No acute osseous abnormality about the right hip. 2. Question of focal lucent lesion measuring approximately 13 mm at the  posterior right femoral neck, indeterminate. Differential considerations include osseous metastasis versus myeloma. Finding could be further assessed with dedicated MRI and/or bone scan as clinically warranted. Electronically Signed   By: Jeannine Boga M.D.   On: 11/07/2018 23:50    Assessment/Plan 1. Lytic bone lesion of hip -pain being managed with oxycodone as needed, took first dose today. Encouraged to use colace routinely in combination to avoid constipation - Protein Electrophoresis, Serum - Protein Electrophoresis, Urine Rflx. - Beta 2 microglobulin, serum - Ambulatory referral to Hematology / Oncology - MR HIP RIGHT WO CONTRAST; Future - CMP with eGFR([facility]) - CBC with Differential/Platelet - Protein Electrophoresis,Random Urn  2. MGUS (monoclonal gammopathy of unknown significance) - Protein Electrophoresis, Serum - Beta 2 microglobulin, serum - Ambulatory referral to Hematology / Oncology - MR HIP RIGHT WO CONTRAST; Future - Protein Electrophoresis,Random Urn  Reviewed and discussed with Dr Mariea Clonts To keep follow up as scheduled.  Carlos American. [place], [facility] [phone]

## 2018-11-15 ENCOUNTER — Telehealth: Payer: Self-pay | Admitting: Hematology

## 2018-11-15 LAB — CBC WITH DIFFERENTIAL/PLATELET
Absolute Monocytes: 743 cells/uL (ref 200–950)
Basophils Absolute: 30 cells/uL (ref 0–200)
Basophils Relative: 0.4 %
Eosinophils Absolute: 218 cells/uL (ref 15–500)
Eosinophils Relative: 2.9 %
HCT: 34.5 % — ABNORMAL LOW (ref 38.5–50.0)
Hemoglobin: 11.6 g/dL — ABNORMAL LOW (ref 13.2–17.1)
Lymphs Abs: 870 cells/uL (ref 850–3900)
MCH: 30.9 pg (ref 27.0–33.0)
MCHC: 33.6 g/dL (ref 32.0–36.0)
MCV: 92 fL (ref 80.0–100.0)
MPV: 10.6 fL (ref 7.5–12.5)
Monocytes Relative: 9.9 %
Neutro Abs: 5640 cells/uL (ref 1500–7800)
Neutrophils Relative %: 75.2 %
Platelets: 316 10*3/uL (ref 140–400)
RBC: 3.75 10*6/uL — ABNORMAL LOW (ref 4.20–5.80)
RDW: 13.9 % (ref 11.0–15.0)
Total Lymphocyte: 11.6 %
WBC: 7.5 10*3/uL (ref 3.8–10.8)

## 2018-11-15 LAB — PROTEIN ELECTROPHORESIS, SERUM
Abnormal Protein Band1: 0.2 g/dL — ABNORMAL HIGH
Albumin ELP: 4.2 g/dL (ref 3.8–4.8)
Alpha 1: 0.3 g/dL (ref 0.2–0.3)
Alpha 2: 0.7 g/dL (ref 0.5–0.9)
Beta 2: 0.4 g/dL (ref 0.2–0.5)
Beta Globulin: 0.4 g/dL (ref 0.4–0.6)
Gamma Globulin: 1.3 g/dL (ref 0.8–1.7)
Total Protein: 7.3 g/dL (ref 6.1–8.1)

## 2018-11-15 LAB — PROTEIN ELECTROPHORESIS,RANDOM URN
Albumin: 84 %
Alpha-1-Globulin, U: 2 %
Alpha-2-Globulin, U: 5 %
Beta Globulin, U: 6 %
Creatinine, Urine: 389 mg/dL — ABNORMAL HIGH (ref 20–320)
Gamma Globulin, U: 4 %
Protein/Creat Ratio: 352 mg/g creat — ABNORMAL HIGH (ref 22–128)
Protein/Creatinine Ratio: 0.352 mg/mg creat — ABNORMAL HIGH (ref 0.022–0.12)
Total Protein, Urine: 137 mg/dL — ABNORMAL HIGH (ref 5–25)

## 2018-11-15 LAB — COMPLETE METABOLIC PANEL WITH GFR
AG Ratio: 1.4 (calc) (ref 1.0–2.5)
ALT: 15 U/L (ref 9–46)
AST: 22 U/L (ref 10–35)
Albumin: 4.3 g/dL (ref 3.6–5.1)
Alkaline phosphatase (APISO): 68 U/L (ref 35–144)
BUN/Creatinine Ratio: 13 (calc) (ref 6–22)
BUN: 20 mg/dL (ref 7–25)
CO2: 26 mmol/L (ref 20–32)
Calcium: 9.5 mg/dL (ref 8.6–10.3)
Chloride: 104 mmol/L (ref 98–110)
Creat: 1.5 mg/dL — ABNORMAL HIGH (ref 0.70–1.11)
GFR, Est African American: 49 mL/min/{1.73_m2} — ABNORMAL LOW (ref >=60)
GFR, Est Non African American: 42 mL/min/{1.73_m2} — ABNORMAL LOW (ref >=60)
Globulin: 3 g/dL (calc) (ref 1.9–3.7)
Glucose, Bld: 103 mg/dL (ref 65–139)
Potassium: 4.5 mmol/L (ref 3.5–5.3)
Sodium: 137 mmol/L (ref 135–146)
Total Bilirubin: 0.5 mg/dL (ref 0.2–1.2)
Total Protein: 7.3 g/dL (ref 6.1–8.1)

## 2018-11-15 LAB — TEST AUTHORIZATION

## 2018-11-15 LAB — IFE INTERPRETATION

## 2018-11-15 LAB — BETA 2 MICROGLOBULIN, SERUM: Beta-2 Microglobulin: 2.84 mg/L — ABNORMAL HIGH (ref <=2.5)

## 2018-11-15 NOTE — Progress Notes (Signed)
HEMATOLOGY/ONCOLOGY CONSULTATION NOTE  Date of Service: 11/16/2018  Patient Care Team: Gayland Curry, DO as PCP - General (Geriatric Medicine) Haroldine Laws, Shaune Pascal, MD as Consulting Physician (Cardiology) Penninger, Ria Comment, [place] as Physician Assistant (Nephrology) Donato Heinz, MD as Consulting Physician (Nephrology) Michael Boston, MD as Consulting Physician (General Surgery) Ralene Bathe, MD as Consulting Physician (Ophthalmology)  CHIEF COMPLAINTS/PURPOSE OF CONSULTATION:  Lytic bone lesions & MGUS  HISTORY OF PRESENTING ILLNESS:   Kevin Schwartz is a wonderful 85 y.o. male who has been referred to [place] by [facility] for evaluation and management of a lytic bone lesions & MGUS. The pt reports that he is doing well overall. Attempted to contact [name], niece, 10x by phone but she did not answer.  The pt reports that 2 weeks ago, he was walking and starting having some hip/lower back pain. He describes it as shooting down the lateral side of his entire leg to the toes when he walks. Denies loss of sensation and tingling in the legs. He was given Percocet, which helps for 3-4 hours. Denies hx of back problems or back surgeries.  He reports losing 100 lbs about 2 years ago, but he has gained some of it back since then. He has been eating well and staying hydrated. He was diagnosed with MGUS in 2002 and was seen 6-7 years ago by Dr. Beryle Beams and Dr. Alen Blew. He has not had his prostate checked recently.  The pt had a DG Hip Unilateral completed on 11/07/2018 which revealed "1. No acute osseous abnormality about the right hip. 2. Question of focal lucent lesion measuring approximately 13 mm at the posterior right femoral neck, indeterminate. Differential considerations include osseous metastasis versus myeloma. Finding could be further assessed with dedicated MRI and/or bone scan as clinically warranted."  Most recent lab results (11/08/2018) of CBC and CMP is  as follows: all values are WNL except for RBC at 3.75, HGB at 11.6, HCT at 34.5, Creatinine at 1.50, GFR at 49.  On review of systems, pt reports hip/lower back pain, eating well, staying hydrated, and denies belly pain, SOB, changes in bowel habits and urination, any other bone pains or symptoms.   On PMHx the pt reports CKD, Diabetes controlled by diet, pinched nerve 10 years ago, and no prostate issues.  MEDICAL HISTORY:  Past Medical History:  Diagnosis Date  . Anemia due to chronic kidney disease   . Bilateral inguinal hernia   . CAD (coronary artery disease) cardiologist-  dr bensimhon   PTCA of OM in 1998, [place] OM in 2000, Cath 9/07 LM ok LAD ok. LCX 95% in OM prior to previous stent RCA. nondominant normal EF normal. [place] DES to OM 2007 (PLACED PROXIAMAL TO PREVIOUS STENT)  . Chronic kidney disease, stage III (moderate) [facility])    nephrologist-  dr detarding  . Depression   . Diabetes mellitus type 2, diet-controlled ([place])    followed by pcp,  last A1c 5.2 on 09-10-2017 in epic  . Gout, unspecified    10-29-2017  per pt stable  . HLD (hyperlipidemia)   . HTN (hypertension)   . MGUS (monoclonal gammopathy of unknown significance) previously followed by dr Beryle Beams , lov and released 08/01/2011   IgG kappa dx 2002 8% plasma cells in bone marrow; no lesions on bone X-rays;  . Nocturia   . OA (osteoarthritis)   . OSA on CPAP    per study 01-30-2004  severe osa , AHI 51.6/hr  . PAD (peripheral  artery disease) ([place])    05-21-2006  left renal artery stenosis, s/p balloon angioplasty and stenting;  last duplex 02/ 2012  normal , arteries patent  . S/P coronary artery stent placement    2000--  BMS x1  to OM;   2007-- DES x1  to OM proximal to previous stent  . Secondary hyperparathyroidism of renal origin ([place])   . Urgency of urination   . Wears glasses     SURGICAL HISTORY: Past Surgical History:  Procedure Laterality Date  . CARDIOVASCULAR STRESS TEST  2010   per dr  bensimhon epic note dated 04-18-2016  normal  . [place]   PTCA to OM  . CORONARY ANGIOPLASTY WITH STENT PLACEMENT  2000   PCI and BMS x1 OM  . CORONARY ANGIOPLASTY WITH STENT PLACEMENT  12-18-2005  dr Claiborne Billings   DES x1 to proximal to previously placed stent in OM  . HERNIA REPAIR    . INGUINAL HERNIA REPAIR Bilateral 10/30/2017   Procedure: LAPAROSCOPIC  BILATERAL INGUINAL HERNIA REPAIR  AND LEFT [place];  Surgeon: Michael Boston, MD;  Location: [place];  Service: General;  Laterality: Bilateral;  . INSERTION OF MESH Bilateral 10/30/2017   Procedure: INSERTION OF MESH;  Surgeon: Michael Boston, MD;  Location: [place];  Service: General;  Laterality: Bilateral;  . RENAL ANGIOPLASTY Left 05-21-2006   dr berry   and stenting  . TOTAL KNEE ARTHROPLASTY Left 07-22-2005   dr Shellia Carwin  [facility]  . TRANSTHORACIC ECHOCARDIOGRAM  04/18/2016   ef 36-64%, grade 1 diastolic dysfunction/  mild to moderate AR/  mild MR    SOCIAL HISTORY: Social History   Socioeconomic History  . Marital status: Widowed    Spouse name: Not on file  . Number of children: 1  . Years of education: Not on file  . Highest education level: Not on file  Occupational History  . Occupation: retired   Scientific laboratory technician  . Financial resource strain: Not hard at all  . Food insecurity    Worry: Never true    Inability: Never true  . Transportation needs    Medical: No    Non-medical: No  Tobacco Use  . Smoking status: Former Smoker    Years: 54.00    Types: Cigarettes    Quit date: 09/29/2017    Years since quitting: 1.1  . Smokeless tobacco: Never Used  . Tobacco comment: 10-29-2017  per pt was smoking 1pp2d, stopped June 2019  Substance and Sexual Activity  . Alcohol use: Not Currently  . Drug use: No  . Sexual activity: Not on file  Lifestyle  . Physical activity    Days per week: 3 days    Minutes per session: 20 min  . Stress: Very much   Relationships  . Social connections    Talks on phone: More than three times a week    Gets together: More than three times a week    Attends religious service: More than 4 times per year    Active member of club or organization: No    Attends meetings of clubs or organizations: Never    Relationship status: Widowed  . Intimate partner violence    Fear of current or ex partner: No    Emotionally abused: No    Physically abused: No    Forced sexual activity: No  Other Topics Concern  . Not on file  Social History Narrative   Widowed 02/2012  Lives along   Stopped smoking 2004   Alcohol none   Exercise works in [place]: Family History  Problem Relation Age of Onset  . Heart disease Mother   . Heart disease Sister   . Heart disease Sister   . Cancer Sister        type unknown  . Hypertension Sister   . Diabetes Brother   . Diabetes Brother   . Diabetes Other   . Coronary artery disease Other   . Cancer Other     ALLERGIES:  is allergic to lisinopril; losartan potassium; and crestor [rosuvastatin].  MEDICATIONS:  Current Outpatient Medications  Medication Sig Dispense Refill  . ACCU-CHEK SOFTCLIX LANCETS lancets Use to test blood sugar daily. Dx:E11.8 300 each 3  . acetaminophen (TYLENOL) 325 MG tablet Take 1 tablet (325 mg total) by mouth every 8 (eight) hours as needed. 30 tablet 0  . Alcohol Swabs (B-D SINGLE USE SWABS REGULAR) PADS Use with testing of blood sugar. Dx:E11.8 300 each 3  . allopurinol (ZYLOPRIM) 100 MG tablet Take 1 tablet (100 mg total) by mouth daily. 90 tablet 3  . amLODipine (NORVASC) 5 MG tablet Take 1 tablet (5 mg total) by mouth daily. 90 tablet 3  . aspirin EC 81 MG tablet Take 81 mg by mouth daily.    [name] atorvastatin (LIPITOR) 80 MG tablet Take 1 tablet (80 mg total) by mouth daily. 90 tablet 1  . cephALEXin (KEFLEX) 250 MG capsule Take 1 capsule (250 mg total) by mouth 3 (three) times daily. 28 capsule  0  . citalopram (CELEXA) 20 MG tablet Take 1 tablet (20 mg total) by mouth daily. 90 tablet 3  . docusate sodium (COLACE) 100 MG capsule Take 1 capsule (100 mg total) by mouth every 12 (twelve) hours. 60 capsule 0  . glucose blood (ACCU-CHEK AVIVA PLUS) test strip Use to check blood sugar daily. Dx:E11.8 300 each 3  . metoprolol tartrate (LOPRESSOR) 50 MG tablet Take 1 tablet (50 mg total) by mouth 2 (two) times daily. 180 tablet 1  . nitroGLYCERIN (NITROSTAT) 0.4 MG SL tablet Place 0.4 mg under the tongue every 5 (five) minutes as needed for chest pain. Up to 3 doses    . oxyCODONE-acetaminophen (PERCOCET) 5-325 MG tablet Take 1 tablet by mouth every 8 (eight) hours as needed for severe pain (if regular tylenol not controlling pain). 10 tablet 0  . predniSONE (DELTASONE) 5 MG tablet Take 6 pills for the first 4 days, 5 pills on day 5, 4 pills day 6, 3 pills day 7, 2 pills the day 8, and 1 pill day 9. 39 tablet 0  . ULTRA-THIN II MINI PEN NEEDLE 31G X 5 MM MISC      No current facility-administered medications for this visit.     REVIEW OF SYSTEMS:    A 10+ POINT REVIEW OF SYSTEMS WAS OBTAINED including neurology, dermatology, psychiatry, cardiac, respiratory, lymph, extremities, GI, GU, Musculoskeletal, constitutional, breasts, reproductive, HEENT.  All pertinent positives are noted in the HPI.  All others are negative.   PHYSICAL EXAMINATION: ECOG PERFORMANCE STATUS: 2 - Symptomatic, <50% confined to bed  . Vitals:   11/16/18 1151  BP: (!) 140/56  Pulse: (!) 55  Resp: 18  Temp: 97.8 F (36.6 C)  SpO2: 100%   Filed Weights   11/16/18 1151  Weight: 166 lb 4.8 oz (75.4 kg)   .Body mass index is 26.05  kg/m.  GENERAL:alert, in no acute distress and comfortable SKIN: no acute rashes, no significant lesions EYES: conjunctiva are pink and non-injected, sclera anicteric OROPHARYNX: MMM, no exudates, no oropharyngeal erythema or ulceration NECK: supple, no JVD LYMPH:  no palpable  lymphadenopathy in the cervical, axillary or inguinal regions LUNGS: clear to auscultation b/l with normal respiratory effort HEART: regular rate & rhythm ABDOMEN:  normoactive bowel sounds , non tender, not distended. Extremity: no pedal edema, right hip and right anterior thigh tenderness with palpation PSYCH: alert & oriented x 3 with fluent speech NEURO: no focal motor/sensory deficits  LABORATORY DATA:  I have reviewed the data as listed  . CBC Latest Ref Rng & Units 11/16/2018 11/08/2018 06/28/2018  WBC 4.0 - 10.5 K/uL 7.9 7.5 6.9  Hemoglobin 13.0 - 17.0 g/dL 12.0(L) 11.6(L) 11.8(L)  Hematocrit 39.0 - 52.0 % 36.6(L) 34.5(L) 34.7(L)  Platelets 150 - 400 K/uL 343 316 318    . CMP Latest Ref Rng & Units 11/16/2018 11/08/2018 11/08/2018  Glucose 70 - 99 mg/dL 96 - 103  BUN 8 - 23 mg/dL 18 - 20  Creatinine 0.61 - 1.24 mg/dL 1.30(H) - 1.50(H)  Sodium 135 - 145 mmol/L 140 - 137  Potassium 3.5 - 5.1 mmol/L 4.1 - 4.5  Chloride 98 - 111 mmol/L 107 - 104  CO2 22 - 32 mmol/L 25 - 26  Calcium 8.9 - 10.3 mg/dL 9.7 - 9.5  Total Protein 6.5 - 8.1 g/dL 7.8 7.3 7.3  Total Bilirubin 0.3 - 1.2 mg/dL 0.4 - 0.5  Alkaline Phos 38 - 126 U/L 70 - -  AST 15 - 41 U/L 18 - 22  ALT 0 - 44 U/L 13 - 15   11/08/2018 Most recent labs:  Component     Latest Ref Rng & Units 11/08/2018 11/08/2018         4:23 PM  4:23 PM  WBC     3.8 - 10.8 Thousand/uL 7.5   RBC     4.20 - 5.80 Million/uL 3.75 (L)   Hemoglobin     13.2 - 17.1 g/dL 11.6 (L)   HCT     38.5 - 50.0 % 34.5 (L)   MCV     80.0 - 100.0 fL 92.0   MCH     27.0 - 33.0 pg 30.9   MCHC     32.0 - 36.0 g/dL 33.6   RDW     11.0 - 15.0 % 13.9   Platelets     140 - 400 Thousand/uL 316   MPV     7.5 - 12.5 fL 10.6   NEUT#     1,500 - 7,800 cells/uL 5,640   Lymphocyte #     850 - 3,900 cells/uL 870   Absolute Monocytes     200 - 950 cells/uL 743   Eosinophils Absolute     15 - 500 cells/uL 218   Basophils Absolute     0 - 200 cells/uL 30    Neutrophils     % 75.2   Total Lymphocyte     % 11.6   Monocytes Relative     % 9.9   Eosinophil     % 2.9   Basophil     % 0.4   Glucose     65 - 139 mg/dL 103   BUN     7 - 25 mg/dL 20   Creatinine     0.70 - 1.11 mg/dL 1.50 (H)   GFR, Est Non  African American     > OR = 60 mL/min/1.45m 42 (L)   GFR, Est African American     > OR = 60 mL/min/1.746m49 (L)   BUN/Creatinine Ratio     6 - 22 (calc) 13   Sodium     135 - 146 mmol/L 137   Potassium     3.5 - 5.3 mmol/L 4.5   Chloride     98 - 110 mmol/L 104   CO2     20 - 32 mmol/L 26   Calcium     8.6 - 10.3 mg/dL 9.5   Total Protein     6.1 - 8.1 g/dL 7.3 7.3  Albumin MSPROF     3.6 - 5.1 g/dL 4.3   Globulin     1.9 - 3.7 g/dL (calc) 3.0   AG Ratio     1.0 - 2.5 (calc) 1.4   Total Bilirubin     0.2 - 1.2 mg/dL 0.5   Alkaline phosphatase (APISO)     35 - 144 U/L 68   AST     10 - 35 U/L 22   ALT     9 - 46 U/L 15   Creatinine, Urine     20 - 320 mg/dL 389 (H)   Protein/Creat Ratio     22 - 128 mg/g creat 352 (H)   Protein/Creatinine Ratio     0.022 - 0.12 mg/mg creat 0.352 (H)   Total Protein, Urine     5 - 25 mg/dL 137 (H)   Albumin     % 84   Alpha-1-Globulin, U     % 2   ALPHA-2-GLOBULIN, U     % 5   Beta Globulin, U     % 6   Gamma Globulin, U     % 4   Interpretation         Albumin ELP     3.8 - 4.8 g/dL  4.2  Alpha 1     0.2 - 0.3 g/dL  0.3  Alpha 2     0.5 - 0.9 g/dL  0.7  Beta Globulin     0.4 - 0.6 g/dL  0.4  Beta 2     0.2 - 0.5 g/dL  0.4  Gamma Globulin     0.8 - 1.7 g/dL  1.3  Abnormal Protein Band1     NONE DETEC g/dL  0.2 (H)  SPE Interp.         Beta-2 Microglobulin     < OR = 2.5 mg/L 2.84 (H)   Immunofix Electr Int           RADIOGRAPHIC STUDIES: I have personally reviewed the radiological images as listed and agreed with the findings in the report. Dg Hip Unilat W Or Wo Pelvis 2-3 Views Right  Result Date: 11/07/2018 CLINICAL DATA:  Initial evaluation for  acute severe right hip pain. EXAM: DG HIP (WITH OR WITHOUT PELVIS) 2-3V RIGHT COMPARISON:  None available. FINDINGS: No acute fracture dislocation. Femoral heads in normal alignment within the acetabula. Femoral head height maintained. Bony pelvis intact. SI joints approximated. Question of a focal lucent lesion measuring approximately 13 mm at the posterior right femoral neck, seen on oblique view only. No other discrete osseous lesions identified. Visualized soft tissues demonstrate no acute finding. Atherosclerotic change noted within the proximal thighs. Degenerative changes noted within the lower lumbar spine. IMPRESSION: 1. No acute osseous abnormality about the right hip. 2. Question of  focal lucent lesion measuring approximately 13 mm at the posterior right femoral neck, indeterminate. Differential considerations include osseous metastasis versus myeloma. Finding could be further assessed with dedicated MRI and/or bone scan as clinically warranted. Electronically Signed   By: Jeannine Boga M.D.   On: 11/07/2018 23:50    ASSESSMENT & PLAN:   #1 MGUS diagnosed in 2002  #2 Lesion on right femoral neck  11/07/2018 DG Hip Unilateral revealed "1. No acute osseous abnormality about the right hip. 2. Question of focal lucent lesion measuring approximately 13 mm at the posterior right femoral neck, indeterminate. Differential considerations include osseous metastasis versus myeloma. Finding could be further assessed with dedicated MRI and/or bone scan as clinically warranted."  #3 Right lower back/hip pain  PLAN: -Discussed patient's most recent labs from 11/08/2018, mild anemia due to CKD -Based on the current scans, it is difficult to tell whether his symptoms are due to a bone tumor or another cause. -His pain seems to be coming from the lower back, possibly a pinched nerve/sciatica -Labs today -Schedule PET scan and MRI L spine -Will see the pt back in 2 weeks by phone   -Labs today  -PET/CT to evaluate for other bone lesions and possible bone mets with unknown primary in 1 week -MRI L spine in 1 week- acute back pain with Sciatica -phone visit in 2 weeks with Dr Irene Limbo   Orders Placed This Encounter  Procedures  . NM PET Image Initial (PI) Whole Body    Standing Status:   Future    Standing Expiration Date:   11/16/2019    Order Specific Question:   ** REASON FOR EXAM (FREE TEXT)    Answer:   bone lesion on femur concerning for myeloma vs metastatic disease for further evaluation    Order Specific Question:   If indicated for the ordered procedure, I authorize the administration of a radiopharmaceutical per Radiology protocol    Answer:   Yes    Order Specific Question:   Preferred imaging location?    Answer:   [facility]    Order Specific Question:   Radiology Contrast Protocol - do NOT remove file path    Answer:   \\[email or web address]  . MR Lumbar Spine Wo Contrast    Standing Status:   Future    Standing Expiration Date:   11/16/2019    Order Specific Question:   ** REASON FOR EXAM (FREE TEXT)    Answer:   Patient with MGUS and femoral bone lesion with acute lower back pain with rt sided sciatica    Order Specific Question:   What is the patient's sedation requirement?    Answer:   No Sedation    Order Specific Question:   Does the patient have a pacemaker or implanted devices?    Answer:   No    Order Specific Question:   Preferred imaging location?    Answer:   [facility] (table limit-350 lbs)    Order Specific Question:   Radiology Contrast Protocol - do NOT remove file path    Answer:   \\[email or web address]  . CBC with Differential/Platelet    Standing Status:   Future    Number of Occurrences:   1    Standing Expiration Date:   12/21/2019  . CMP ([place] only)    Standing Status:   Future    Number of Occurrences:   1    Standing Expiration Date:   11/16/2019  . Multiple Myeloma Panel  (  SPEP&IFE w/QIG)    Standing Status:   Future    Number of Occurrences:   1    Standing Expiration Date:   11/16/2019  . Kappa/lambda light chains    Standing Status:   Future    Number of Occurrences:   1    Standing Expiration Date:   12/21/2019  . Lactate dehydrogenase    Standing Status:   Future    Number of Occurrences:   1    Standing Expiration Date:   11/16/2019  . Prostate-Specific AG, Serum    Standing Status:   Future    Number of Occurrences:   1    Standing Expiration Date:   11/16/2019    All of the patients questions were answered with apparent satisfaction. The patient knows to call the clinic with any problems, questions or concerns.  I spent 35 minutes counseling the patient face to face. The total time spent in the appointment was 45 minutes and more than 50% was on counseling and direct patient cares.  Sullivan Lone MD MS AAHIVMS [facility] Hematology/Oncology Physician [facility]  (Office):       [phone] (Work cell):  [phone] (Fax):           [phone]  11/16/2018 12:29 PM  I, De Burrs, am acting as a scribe for Dr. Irene Limbo  .I have reviewed the above documentation for accuracy and completeness, and I agree with the above. Brunetta Genera MD

## 2018-11-15 NOTE — Telephone Encounter (Signed)
Received a call from [place] at [facility] to schedule an appt for [name] w/dx of lytic bone lesions and MGUS. Pt has been scheduled to see Dr. Irene Limbo on 8/4 at 11am. Lattie Haw will notify the pt.

## 2018-11-16 ENCOUNTER — Inpatient Hospital Stay: Payer: Medicare HMO | Attending: Hematology | Admitting: Hematology

## 2018-11-16 ENCOUNTER — Telehealth: Payer: Self-pay | Admitting: Hematology

## 2018-11-16 ENCOUNTER — Inpatient Hospital Stay: Payer: Medicare HMO

## 2018-11-16 ENCOUNTER — Other Ambulatory Visit: Payer: Self-pay

## 2018-11-16 VITALS — BP 140/56 | HR 55 | Temp 97.8°F | Resp 18 | Ht 67.0 in | Wt 166.3 lb

## 2018-11-16 DIAGNOSIS — Z79899 Other long term (current) drug therapy: Secondary | ICD-10-CM

## 2018-11-16 DIAGNOSIS — D472 Monoclonal gammopathy: Secondary | ICD-10-CM

## 2018-11-16 DIAGNOSIS — E1122 Type 2 diabetes mellitus with diabetic chronic kidney disease: Secondary | ICD-10-CM | POA: Insufficient documentation

## 2018-11-16 DIAGNOSIS — C7951 Secondary malignant neoplasm of bone: Secondary | ICD-10-CM

## 2018-11-16 DIAGNOSIS — M899 Disorder of bone, unspecified: Secondary | ICD-10-CM

## 2018-11-16 DIAGNOSIS — Z809 Family history of malignant neoplasm, unspecified: Secondary | ICD-10-CM | POA: Insufficient documentation

## 2018-11-16 DIAGNOSIS — M25551 Pain in right hip: Secondary | ICD-10-CM | POA: Diagnosis not present

## 2018-11-16 DIAGNOSIS — Z833 Family history of diabetes mellitus: Secondary | ICD-10-CM | POA: Insufficient documentation

## 2018-11-16 DIAGNOSIS — N183 Chronic kidney disease, stage 3 (moderate): Secondary | ICD-10-CM | POA: Diagnosis not present

## 2018-11-16 DIAGNOSIS — D631 Anemia in chronic kidney disease: Secondary | ICD-10-CM | POA: Insufficient documentation

## 2018-11-16 DIAGNOSIS — M545 Low back pain: Secondary | ICD-10-CM | POA: Diagnosis not present

## 2018-11-16 DIAGNOSIS — Z888 Allergy status to other drugs, medicaments and biological substances status: Secondary | ICD-10-CM | POA: Diagnosis not present

## 2018-11-16 DIAGNOSIS — Z87891 Personal history of nicotine dependence: Secondary | ICD-10-CM | POA: Diagnosis not present

## 2018-11-16 DIAGNOSIS — Z8249 Family history of ischemic heart disease and other diseases of the circulatory system: Secondary | ICD-10-CM | POA: Diagnosis not present

## 2018-11-16 DIAGNOSIS — I739 Peripheral vascular disease, unspecified: Secondary | ICD-10-CM | POA: Diagnosis not present

## 2018-11-16 LAB — CBC WITH DIFFERENTIAL/PLATELET
Abs Immature Granulocytes: 0.02 10*3/uL (ref 0.00–0.07)
Basophils Absolute: 0 10*3/uL (ref 0.0–0.1)
Basophils Relative: 0 %
Eosinophils Absolute: 0.2 10*3/uL (ref 0.0–0.5)
Eosinophils Relative: 2 %
HCT: 36.6 % — ABNORMAL LOW (ref 39.0–52.0)
Hemoglobin: 12 g/dL — ABNORMAL LOW (ref 13.0–17.0)
Immature Granulocytes: 0 %
Lymphocytes Relative: 10 %
Lymphs Abs: 0.8 10*3/uL (ref 0.7–4.0)
MCH: 30.5 pg (ref 26.0–34.0)
MCHC: 32.8 g/dL (ref 30.0–36.0)
MCV: 93.1 fL (ref 80.0–100.0)
Monocytes Absolute: 0.5 10*3/uL (ref 0.1–1.0)
Monocytes Relative: 6 %
Neutro Abs: 6.4 10*3/uL (ref 1.7–7.7)
Neutrophils Relative %: 82 %
Platelets: 343 10*3/uL (ref 150–400)
RBC: 3.93 MIL/uL — ABNORMAL LOW (ref 4.22–5.81)
RDW: 14.2 % (ref 11.5–15.5)
WBC: 7.9 10*3/uL (ref 4.0–10.5)
nRBC: 0 % (ref 0.0–0.2)

## 2018-11-16 LAB — CMP (CANCER CENTER ONLY)
ALT: 13 U/L (ref 0–44)
AST: 18 U/L (ref 15–41)
Albumin: 4.2 g/dL (ref 3.5–5.0)
Alkaline Phosphatase: 70 U/L (ref 38–126)
Anion gap: 8 (ref 5–15)
BUN: 18 mg/dL (ref 8–23)
CO2: 25 mmol/L (ref 22–32)
Calcium: 9.7 mg/dL (ref 8.9–10.3)
Chloride: 107 mmol/L (ref 98–111)
Creatinine: 1.3 mg/dL — ABNORMAL HIGH (ref 0.61–1.24)
GFR, Est AFR Am: 58 mL/min — ABNORMAL LOW (ref >60)
GFR, Estimated: 50 mL/min — ABNORMAL LOW (ref >60)
Glucose, Bld: 96 mg/dL (ref 70–99)
Potassium: 4.1 mmol/L (ref 3.5–5.1)
Sodium: 140 mmol/L (ref 135–145)
Total Bilirubin: 0.4 mg/dL (ref 0.3–1.2)
Total Protein: 7.8 g/dL (ref 6.5–8.1)

## 2018-11-16 LAB — LACTATE DEHYDROGENASE: LDH: 106 U/L (ref 98–192)

## 2018-11-16 NOTE — Telephone Encounter (Signed)
Gave avs and calendar ° °

## 2018-11-17 LAB — MULTIPLE MYELOMA PANEL, SERUM
Albumin SerPl Elph-Mcnc: 3.9 g/dL (ref 2.9–4.4)
Albumin/Glob SerPl: 1.3 (ref 0.7–1.7)
Alpha 1: 0.2 g/dL (ref 0.0–0.4)
Alpha2 Glob SerPl Elph-Mcnc: 0.7 g/dL (ref 0.4–1.0)
B-Globulin SerPl Elph-Mcnc: 0.8 g/dL (ref 0.7–1.3)
Gamma Glob SerPl Elph-Mcnc: 1.3 g/dL (ref 0.4–1.8)
Globulin, Total: 3.1 g/dL (ref 2.2–3.9)
IgA: 380 mg/dL (ref 61–437)
IgG (Immunoglobin G), Serum: 1377 mg/dL (ref 603–1613)
IgM (Immunoglobulin M), Srm: 32 mg/dL (ref 15–143)
M Protein SerPl Elph-Mcnc: 0.2 g/dL — ABNORMAL HIGH
Total Protein ELP: 7 g/dL (ref 6.0–8.5)

## 2018-11-17 LAB — PROSTATE-SPECIFIC AG, SERUM (LABCORP): Prostate Specific Ag, Serum: 1.3 ng/mL (ref 0.0–4.0)

## 2018-11-17 LAB — KAPPA/LAMBDA LIGHT CHAINS
Kappa free light chain: 32.3 mg/L — ABNORMAL HIGH (ref 3.3–19.4)
Kappa, lambda light chain ratio: 1.55 (ref 0.26–1.65)
Lambda free light chains: 20.8 mg/L (ref 5.7–26.3)

## 2018-11-23 ENCOUNTER — Telehealth: Payer: Self-pay | Admitting: *Deleted

## 2018-11-23 NOTE — Telephone Encounter (Signed)
Contacted by Horris Latino in MRI - MRI ordered for next week needs to be with AND without contrast.She is requesting a new (verbal) order from Dr. Irene Limbo. Dr. Irene Limbo gave verbal order to change to with AND without contrast. Laneta Simmers with verbal order.

## 2018-11-29 ENCOUNTER — Ambulatory Visit (HOSPITAL_COMMUNITY)
Admission: RE | Admit: 2018-11-29 | Discharge: 2018-11-29 | Disposition: A | Discharge status: Home / Self Care | Payer: Medicare HMO | Source: Ambulatory Visit | Attending: Hematology | Admitting: Hematology

## 2018-11-29 ENCOUNTER — Other Ambulatory Visit: Payer: Self-pay

## 2018-11-29 DIAGNOSIS — M47816 Spondylosis without myelopathy or radiculopathy, lumbar region: Secondary | ICD-10-CM | POA: Insufficient documentation

## 2018-11-29 DIAGNOSIS — I700 Atherosclerosis of aorta: Secondary | ICD-10-CM | POA: Insufficient documentation

## 2018-11-29 DIAGNOSIS — M5126 Other intervertebral disc displacement, lumbar region: Secondary | ICD-10-CM | POA: Insufficient documentation

## 2018-11-29 DIAGNOSIS — D472 Monoclonal gammopathy: Secondary | ICD-10-CM | POA: Insufficient documentation

## 2018-11-29 DIAGNOSIS — I2510 Atherosclerotic heart disease of native coronary artery without angina pectoris: Secondary | ICD-10-CM | POA: Insufficient documentation

## 2018-11-29 DIAGNOSIS — C7951 Secondary malignant neoplasm of bone: Secondary | ICD-10-CM | POA: Diagnosis not present

## 2018-11-29 DIAGNOSIS — N400 Enlarged prostate without lower urinary tract symptoms: Secondary | ICD-10-CM | POA: Diagnosis not present

## 2018-11-29 DIAGNOSIS — M48061 Spinal stenosis, lumbar region without neurogenic claudication: Secondary | ICD-10-CM | POA: Diagnosis not present

## 2018-11-29 LAB — GLUCOSE, CAPILLARY: Glucose-Capillary: 87 mg/dL (ref 70–99)

## 2018-11-29 MED ORDER — GADOBUTROL 1 MMOL/ML IV SOLN
7.0000 mL | Freq: Once | INTRAVENOUS | Status: AC | PRN
  Administered 2018-11-29: 10:00:00 7 mL via INTRAVENOUS

## 2018-11-29 MED ORDER — FLUDEOXYGLUCOSE F - 18 (FDG) INJECTION
8.2800 | Freq: Once | INTRAVENOUS | Status: AC | PRN
  Administered 2018-11-29: 8.28 via INTRAVENOUS

## 2018-11-29 NOTE — Progress Notes (Incomplete)
HEMATOLOGY/ONCOLOGY CONSULTATION NOTE  Date of Service: 11/29/2018  Patient Care Team: Gayland Curry, DO as PCP - General (Geriatric Medicine) Haroldine Laws, Shaune Pascal, MD as Consulting Physician (Cardiology) Penninger, Ria Comment, [place] as Physician Assistant (Nephrology) Donato Heinz, MD as Consulting Physician (Nephrology) Michael Boston, MD as Consulting Physician (General Surgery) Ralene Bathe, MD as Consulting Physician (Ophthalmology)  CHIEF COMPLAINTS/PURPOSE OF CONSULTATION:  Lytic bone lesions & MGUS   HISTORY OF PRESENTING ILLNESS:  Kevin Schwartz is a wonderful 85 y.o. male who has been referred to [place] by [facility] for evaluation and management of a lytic bone lesions & MGUS. The pt reports that he is doing well overall. Attempted to contact [name], niece, 10x by phone but she did not answer.  The pt reports that 2 weeks ago, he was walking and starting having some hip/lower back pain. He describes it as shooting down the lateral side of his entire leg to the toes when he walks. Denies loss of sensation and tingling in the legs. He was given Percocet, which helps for 3-4 hours. Denies hx of back problems or back surgeries.  He reports losing 100 lbs about 2 years ago, but he has gained some of it back since then. He has been eating well and staying hydrated. He was diagnosed with MGUS in 2002 and was seen 6-7 years ago by Dr. Beryle Beams and Dr. Alen Blew. He has not had his prostate checked recently.  The pt had a DG Hip Unilateral completed on 11/07/2018 which revealed "1. No acute osseous abnormality about the right hip. 2. Question of focal lucent lesion measuring approximately 13 mm at the posterior right femoral neck, indeterminate. Differential considerations include osseous metastasis versus myeloma. Finding could be further assessed with dedicated MRI and/or bone scan as clinically warranted."  Most recent lab results (11/08/2018) of CBC and CMP is  as follows: all values are WNL except for RBC at 3.75, HGB at 11.6, HCT at 34.5, Creatinine at 1.50, GFR at 49.  On review of systems, pt reports hip/lower back pain, eating well, staying hydrated, and denies belly pain, SOB, changes in bowel habits and urination, any other bone pains or symptoms.   On PMHx the pt reports CKD, Diabetes controlled by diet, pinched nerve 10 years ago, and no prostate issues.   INTERVAL HISTORY: Kevin Schwartz is here today for follow up and treatment of his lytic bone lesions & MGUS. The patient's last visit with [place] was on ***. The pt reports that *** is doing well overall.  The pt reports ***  Of note since the patient's last visit, pt has had *** completed on *** with results revealing ***.  Lab results today (11/29/18) of CBC w/diff and CMP is as follows: all values are WNL except for ***.  On review of systems, pt reports *** and denies ***and any other symptoms.   A&P: -Discussed pt labwork today, 11/29/18; *** -***    MEDICAL HISTORY:  Past Medical History:  Diagnosis Date   Anemia due to chronic kidney disease    Bilateral inguinal hernia    CAD (coronary artery disease) cardiologist-  dr bensimhon   PTCA of OM in 1998, [place] OM in 2000, Cath 9/07 LM ok LAD ok. LCX 95% in OM prior to previous stent RCA. nondominant normal EF normal. [place] DES to OM 2007 (PLACED PROXIAMAL TO PREVIOUS STENT)   Chronic kidney disease, stage III (moderate) [facility])    nephrologist-  dr detarding   Depression  Diabetes mellitus type 2, diet-controlled ([place])    followed by pcp,  last A1c 5.2 on 09-10-2017 in epic   Gout, unspecified    10-29-2017  per pt stable   HLD (hyperlipidemia)    HTN (hypertension)    MGUS (monoclonal gammopathy of unknown significance) previously followed by dr Beryle Beams , [facility] and released 08/01/2011   IgG kappa dx 2002 8% plasma cells in bone marrow; no lesions on bone X-rays;   Nocturia    OA (osteoarthritis)      OSA on CPAP    per study 01-30-2004  severe osa , AHI 51.6/hr   PAD (peripheral artery disease) ([place])    05-21-2006  left renal artery stenosis, s/p balloon angioplasty and stenting;  last duplex 02/ 2012  normal , arteries patent   S/P coronary artery stent placement    2000--  BMS x1  to OM;   2007-- DES x1  to OM proximal to previous stent   Secondary hyperparathyroidism of renal origin [facility])    Urgency of urination    Wears glasses     SURGICAL HISTORY: Past Surgical History:  Procedure Laterality Date   CARDIOVASCULAR STRESS TEST  2010   per dr bensimhon epic note dated 04-18-2016  normal   CORONARY ANGIOPLASTY  1998   PTCA to [place]  2000   PCI and BMS x1 OM   CORONARY ANGIOPLASTY WITH STENT PLACEMENT  12-18-2005  dr Claiborne Billings   DES x1 to proximal to previously placed stent in [place] Bilateral 10/30/2017   Procedure: LAPAROSCOPIC  BILATERAL INGUINAL [place];  Surgeon: Michael Boston, MD;  Location: [place];  Service: General;  Laterality: Bilateral;   INSERTION OF MESH Bilateral 10/30/2017   Procedure: INSERTION OF MESH;  Surgeon: Michael Boston, MD;  Location: [place];  Service: General;  Laterality: Bilateral;   RENAL ANGIOPLASTY Left 05-21-2006   dr berry   and stenting   TOTAL KNEE ARTHROPLASTY Left 07-22-2005   dr Shellia Carwin  [facility]   TRANSTHORACIC ECHOCARDIOGRAM  04/18/2016   ef 96-29%, grade 1 diastolic dysfunction/  mild to moderate AR/  mild MR    SOCIAL HISTORY: Social History   Socioeconomic History   Marital status: Widowed    Spouse name: Not on file   Number of children: 1   Years of education: Not on file   Highest education level: Not on file  Occupational History   Occupation: retired   Scientist, product/process development strain: Not hard at all   Food insecurity    Worry: Never  true    Inability: Never true   Transportation needs    Medical: No    Non-medical: No  Tobacco Use   Smoking status: Former Smoker    Years: 54.00    Types: Cigarettes    Quit date: 09/29/2017    Years since quitting: 1.1   Smokeless tobacco: Never Used   Tobacco comment: 10-29-2017  per pt was smoking 1pp2d, stopped June 2019  Substance and Sexual Activity   Alcohol use: Not Currently   Drug use: No   Sexual activity: Not on file  Lifestyle   Physical activity    Days per week: 3 days    Minutes per session: 20 min   Stress: Very much  Relationships   Social connections    Talks on  phone: More than three times a week    Gets together: More than three times a week    Attends religious service: More than 4 times per year    Active member of club or organization: No    Attends meetings of clubs or organizations: Never    Relationship status: Widowed   Intimate partner violence    Fear of current or ex partner: No    Emotionally abused: No    Physically abused: No    Forced sexual activity: No  Other Topics Concern   Not on file  Social History Narrative   Widowed 02/2012   Lives along   Stopped smoking 2004   Alcohol none   Exercise works in [place]: Family History  Problem Relation Age of Onset   Heart disease Mother    Heart disease Sister    Heart disease Sister    Cancer Sister        type unknown   Hypertension Sister    Diabetes Brother    Diabetes Brother    Diabetes Other    Coronary artery disease Other    Cancer Other     ALLERGIES:  is allergic to lisinopril; losartan potassium; and crestor [rosuvastatin].  MEDICATIONS:  Current Outpatient Medications  Medication Sig Dispense Refill   ACCU-CHEK SOFTCLIX LANCETS lancets Use to test blood sugar daily. Dx:E11.8 300 each 3   acetaminophen (TYLENOL) 325 MG tablet Take 1 tablet (325 mg total) by mouth every 8 (eight) hours as needed.  30 tablet 0   Alcohol Swabs (B-D SINGLE USE SWABS REGULAR) PADS Use with testing of blood sugar. Dx:E11.8 300 each 3   allopurinol (ZYLOPRIM) 100 MG tablet Take 1 tablet (100 mg total) by mouth daily. 90 tablet 3   amLODipine (NORVASC) 5 MG tablet Take 1 tablet (5 mg total) by mouth daily. 90 tablet 3   aspirin EC 81 MG tablet Take 81 mg by mouth daily.     atorvastatin (LIPITOR) 80 MG tablet Take 1 tablet (80 mg total) by mouth daily. 90 tablet 1   cephALEXin (KEFLEX) 250 MG capsule Take 1 capsule (250 mg total) by mouth 3 (three) times daily. 28 capsule 0   citalopram (CELEXA) 20 MG tablet Take 1 tablet (20 mg total) by mouth daily. 90 tablet 3   docusate sodium (COLACE) 100 MG capsule Take 1 capsule (100 mg total) by mouth every 12 (twelve) hours. 60 capsule 0   glucose blood (ACCU-CHEK AVIVA PLUS) test strip Use to check blood sugar daily. Dx:E11.8 300 each 3   metoprolol tartrate (LOPRESSOR) 50 MG tablet Take 1 tablet (50 mg total) by mouth 2 (two) times daily. 180 tablet 1   nitroGLYCERIN (NITROSTAT) 0.4 MG SL tablet Place 0.4 mg under the tongue every 5 (five) minutes as needed for chest pain. Up to 3 doses     oxyCODONE-acetaminophen (PERCOCET) 5-325 MG tablet Take 1 tablet by mouth every 8 (eight) hours as needed for severe pain (if regular tylenol not controlling pain). 10 tablet 0   predniSONE (DELTASONE) 5 MG tablet Take 6 pills for the first 4 days, 5 pills on day 5, 4 pills day 6, 3 pills day 7, 2 pills the day 8, and 1 pill day 9. 39 tablet 0   ULTRA-THIN II MINI PEN NEEDLE 31G X 5 MM MISC      No current facility-administered medications for this visit.  REVIEW OF SYSTEMS:   A 10+ POINT REVIEW OF SYSTEMS WAS OBTAINED including neurology, dermatology, psychiatry, cardiac, respiratory, lymph, extremities, GI, GU, Musculoskeletal, constitutional, breasts, reproductive, HEENT.  All pertinent positives are noted in the HPI.  All others are negative.    PHYSICAL  EXAMINATION: ECOG PERFORMANCE STATUS: 2 - Symptomatic, <50% confined to bed  . There were no vitals filed for this visit. There were no vitals filed for this visit. .There is no height or weight on file to calculate BMI.   GENERAL:alert, in no acute distress and comfortable SKIN: no acute rashes, no significant lesions EYES: conjunctiva are pink and non-injected, sclera anicteric OROPHARYNX: MMM, no exudates, no oropharyngeal erythema or ulceration NECK: supple, no JVD LYMPH:  no palpable lymphadenopathy in the cervical, axillary or inguinal regions LUNGS: clear to auscultation b/l with normal respiratory effort HEART: regular rate & rhythm ABDOMEN:  normoactive bowel sounds , non tender, not distended. Extremity: no pedal edema PSYCH: alert & oriented x 3 with fluent speech NEURO: no focal motor/sensory deficits  {GENERAL:alert, in no acute distress and comfortable SKIN: no acute rashes, no significant lesions EYES: conjunctiva are pink and non-injected, sclera anicteric OROPHARYNX: MMM, no exudates, no oropharyngeal erythema or ulceration NECK: supple, no JVD LYMPH:  no palpable lymphadenopathy in the cervical, axillary or inguinal regions LUNGS: clear to auscultation b/l with normal respiratory effort HEART: regular rate & rhythm ABDOMEN:  normoactive bowel sounds , non tender, not distended. Extremity: no pedal edema, right hip and right anterior thigh tenderness with palpation PSYCH: alert & oriented x 3 with fluent speech NEURO: no focal motor/sensory deficits}  LABORATORY DATA:  I have reviewed the data as listed  . CBC Latest Ref Rng & Units 11/16/2018 11/08/2018 06/28/2018  WBC 4.0 - 10.5 K/uL 7.9 7.5 6.9  Hemoglobin 13.0 - 17.0 g/dL 12.0(L) 11.6(L) 11.8(L)  Hematocrit 39.0 - 52.0 % 36.6(L) 34.5(L) 34.7(L)  Platelets 150 - 400 K/uL 343 316 318    . CMP Latest Ref Rng & Units 11/16/2018 11/08/2018 11/08/2018  Glucose 70 - 99 mg/dL 96 - 103  BUN 8 - 23 mg/dL 18 - 20    Creatinine 0.61 - 1.24 mg/dL 1.30(H) - 1.50(H)  Sodium 135 - 145 mmol/L 140 - 137  Potassium 3.5 - 5.1 mmol/L 4.1 - 4.5  Chloride 98 - 111 mmol/L 107 - 104  CO2 22 - 32 mmol/L 25 - 26  Calcium 8.9 - 10.3 mg/dL 9.7 - 9.5  Total Protein 6.5 - 8.1 g/dL 7.8 7.3 7.3  Total Bilirubin 0.3 - 1.2 mg/dL 0.4 - 0.5  Alkaline Phos 38 - 126 U/L 70 - -  AST 15 - 41 U/L 18 - 22  ALT 0 - 44 U/L 13 - 15   11/08/2018 Most recent labs:  Component     Latest Ref Rng & Units 11/08/2018 11/08/2018         4:23 PM  4:23 PM  WBC     3.8 - 10.8 Thousand/uL 7.5   RBC     4.20 - 5.80 Million/uL 3.75 (L)   Hemoglobin     13.2 - 17.1 g/dL 11.6 (L)   HCT     38.5 - 50.0 % 34.5 (L)   MCV     80.0 - 100.0 fL 92.0   MCH     27.0 - 33.0 pg 30.9   MCHC     32.0 - 36.0 g/dL 33.6   RDW     11.0 - 15.0 % 13.9  Platelets     140 - 400 Thousand/uL 316   MPV     7.5 - 12.5 fL 10.6   NEUT#     1,500 - 7,800 cells/uL 5,640   Lymphocyte #     850 - 3,900 cells/uL 870   Absolute Monocytes     200 - 950 cells/uL 743   Eosinophils Absolute     15 - 500 cells/uL 218   Basophils Absolute     0 - 200 cells/uL 30   Neutrophils     % 75.2   Total Lymphocyte     % 11.6   Monocytes Relative     % 9.9   Eosinophil     % 2.9   Basophil     % 0.4   Glucose     65 - 139 mg/dL 103   BUN     7 - 25 mg/dL 20   Creatinine     0.70 - 1.11 mg/dL 1.50 (H)   GFR, Est Non African American     > OR = 60 mL/min/1.59m 42 (L)   GFR, Est African American     > OR = 60 mL/min/1.738m49 (L)   BUN/Creatinine Ratio     6 - 22 (calc) 13   Sodium     135 - 146 mmol/L 137   Potassium     3.5 - 5.3 mmol/L 4.5   Chloride     98 - 110 mmol/L 104   CO2     20 - 32 mmol/L 26   Calcium     8.6 - 10.3 mg/dL 9.5   Total Protein     6.1 - 8.1 g/dL 7.3 7.3  Albumin MSPROF     3.6 - 5.1 g/dL 4.3   Globulin     1.9 - 3.7 g/dL (calc) 3.0   AG Ratio     1.0 - 2.5 (calc) 1.4   Total Bilirubin     0.2 - 1.2 mg/dL 0.5    Alkaline phosphatase (APISO)     35 - 144 U/L 68   AST     10 - 35 U/L 22   ALT     9 - 46 U/L 15   Creatinine, Urine     20 - 320 mg/dL 389 (H)   Protein/Creat Ratio     22 - 128 mg/g creat 352 (H)   Protein/Creatinine Ratio     0.022 - 0.12 mg/mg creat 0.352 (H)   Total Protein, Urine     5 - 25 mg/dL 137 (H)   Albumin     % 84   Alpha-1-Globulin, U     % 2   ALPHA-2-GLOBULIN, U     % 5   Beta Globulin, U     % 6   Gamma Globulin, U     % 4   Interpretation         Albumin ELP     3.8 - 4.8 g/dL  4.2  Alpha 1     0.2 - 0.3 g/dL  0.3  Alpha 2     0.5 - 0.9 g/dL  0.7  Beta Globulin     0.4 - 0.6 g/dL  0.4  Beta 2     0.2 - 0.5 g/dL  0.4  Gamma Globulin     0.8 - 1.7 g/dL  1.3  Abnormal Protein Band1     NONE DETEC g/dL  0.2 (H)  SPE Interp.  Beta-2 Microglobulin     < OR = 2.5 mg/L 2.84 (H)   Immunofix Electr Int           RADIOGRAPHIC STUDIES: I have personally reviewed the radiological images as listed and agreed with the findings in the report. Mr Lumbar Spine W Wo Contrast  Result Date: 11/29/2018 CLINICAL DATA:  Back pain, acute, multiple myeloma. Patient with MGUS and femoral bone lesion with acute lower back pain with right-sided sciatica. EXAM: MRI LUMBAR SPINE WITHOUT AND WITH CONTRAST TECHNIQUE: Multiplanar and multiecho pulse sequences of the lumbar spine were obtained without and with intravenous contrast. CONTRAST:  7 mL Gadavist COMPARISON:  CT abdomen/pelvis 09/30/2010 FINDINGS: Several sequences are motion degraded. Segmentation: For the purposes of this dictation, five lumbar vertebrae are assumed and the caudal most well-formed intervertebral disc is designated L5-S1. Alignment:  7 mm L4-L5 grade 1 anterolisthesis. Vertebrae: Vertebral body height is maintained. No evidence of compression deformity. There are small T1 hypointense/T2 hyperintense lesions abutting the endplates at multiple levels, some of which demonstrate enhancement.  These have an appearance most suggestive of degenerative endplate changes/Schmorl nodes. No definite suspicious osseous lesions. Conus medullaris and cauda equina: Conus extends to the L1-L2 level. Conus and cauda equina appear normal. Paraspinal and other soft tissues: Aortic atherosclerosis. Small T1/T2 hyperintense lesion within the interpolar left kidney, incompletely assessed but possibly reflecting a hemorrhagic or proteinaceous cyst. Atrophy of the paraspinal musculature. Disc levels: T12-L1: This level is not included on axial imaging. No significant canal or foraminal stenosis. L1-L2: Disc bulge with superimposed tiny central disc protrusion. No significant spinal canal stenosis or neural foraminal narrowing. L2-L3: Disc bulge. Superimposed moderate-sized right subarticular to right foraminal disc extrusion which has migrated cranially into the right L2-L3 neural foramen (series 14, image 5). Facet arthrosis/ligamentum flavum hypertrophy. The disc extrusion contributes to right subarticular narrowing with posterior displacement of the descending right L3 nerve root. The disc extrusion also contributes to severe right neural foraminal narrowing. Mild relative central canal narrowing. L3-L4: Disc bulge. Facet arthrosis/ligamentum flavum hypertrophy. No significant spinal canal stenosis. Mild relative right neural foraminal narrowing. L4-L5: Grade 1 anterolisthesis. Disc bulge. Superimposed right foraminal/extraforaminal disc protrusion. Facet arthrosis/ligamentum flavum hypertrophy. 4.7 x 1.1 cm (TV x CC) ventrally projecting synovial facet cyst on the right. Mild bilateral subarticular narrowing (greater on the right) with the ventrally projecting synovial facet cyst contacting the descending right L5 nerve root. Mild relative central canal and right neural foraminal narrowing. L5-S1: Facet arthrosis. No significant disc herniation, spinal canal stenosis or neural foraminal narrowing. IMPRESSION: Small  scattered signal changes and enhancement abutting the endplates at multiple levels, likely degenerative. No definite focal suspicious osseous lesion. Lumbar spondylosis as described. At L2-L3, a right subarticular to foraminal disc extrusion has migrated cranially into the right L2-L3 neural foramen. This disc extrusion contributes to right subarticular narrowing with contact upon the descending right L3 nerve root. The extrusion also contributes to severe right neural foraminal narrowing at this level. At L4-L5, a 1.1 cm ventrally projecting synovial facet cyst contributes to right subarticular narrowing with contact upon the descending right L5 nerve root. No more than mild central canal stenosis at any level. Additional sites of mild neural foraminal narrowing as described. Electronically Signed   By: Kellie Simmering   On: 11/29/2018 10:12   Nm Pet Image Initial (pi) Whole Body  Result Date: 11/29/2018 CLINICAL DATA:  Initial treatment strategy for history of MGUS and questionable lucent lesion in the right femoral neck on  radiographs. EXAM: NUCLEAR MEDICINE PET WHOLE BODY TECHNIQUE: 8.3 mCi F-18 FDG was injected intravenously. Full-ring PET imaging was performed from the skull base to thigh after the radiotracer. CT data was obtained and used for attenuation correction and anatomic localization. Fasting blood glucose: 87 mg/dl COMPARISON:  11/07/2026 right hip radiographs. 10/10/2010 CT chest, abdomen and pelvis. FINDINGS: Mediastinal blood pool activity: SUV max 3.4 HEAD/NECK: No hypermetabolic activity in the scalp. No hypermetabolic cervical lymph nodes. Incidental CT findings: none CHEST: No enlarged or hypermetabolic axillary, mediastinal or hilar lymph nodes. No hypermetabolic pulmonary findings. Incidental CT findings: Coronary atherosclerosis. Atherosclerotic nonaneurysmal thoracic aorta. Mild cardiomegaly. Tiny 2 mm solid posterior left upper lobe pulmonary nodule (series 8/image 22), stable since 2012  CT, considered benign. No acute consolidative airspace disease, lung masses or new significant pulmonary nodules. ABDOMEN/PELVIS: No abnormal hypermetabolic activity within the liver, pancreas, adrenal glands, or spleen. No hypermetabolic lymph nodes in the abdomen or pelvis. Incidental CT findings: Simple 1.6 cm posterior interpolar left renal cyst. Atherosclerotic abdominal aorta with ectatic 2.9 cm infrarenal abdominal aorta. Mildly enlarged prostate. SKELETON: No focal hypermetabolic activity to suggest skeletal metastasis. Incidental CT findings: none EXTREMITIES: No abnormal hypermetabolic activity in the lower extremities. Mild hypermetabolism at the insertion of the right gluteal musculature on to the right greater trochanter without osseous lesion. Incidental CT findings: Status post left total knee arthroplasty. IMPRESSION: 1. No evidence of hypermetabolic metastatic disease. Specifically, no hypermetabolic osseous lesions. No hypermetabolic adenopathy. Normal size spleen without splenic hypermetabolism. 2. Mild hypermetabolism at the insertion of the right gluteal musculature on to the right greater trochanter, without osseous lesion, most compatible with inflammatory uptake such as due to enthesopathy or bursitis. 3. Chronic findings include: Aortic Atherosclerosis (ICD10-I70.0). Ectatic 2.9 cm infrarenal abdominal aorta. Coronary atherosclerosis. Mild prostatomegaly. Electronically Signed   By: Ilona Sorrel M.D.   On: 11/29/2018 13:09   Dg Hip Unilat W Or Wo Pelvis 2-3 Views Right  Result Date: 11/07/2018 CLINICAL DATA:  Initial evaluation for acute severe right hip pain. EXAM: DG HIP (WITH OR WITHOUT PELVIS) 2-3V RIGHT COMPARISON:  None available. FINDINGS: No acute fracture dislocation. Femoral heads in normal alignment within the acetabula. Femoral head height maintained. Bony pelvis intact. SI joints approximated. Question of a focal lucent lesion measuring approximately 13 mm at the posterior  right femoral neck, seen on oblique view only. No other discrete osseous lesions identified. Visualized soft tissues demonstrate no acute finding. Atherosclerotic change noted within the proximal thighs. Degenerative changes noted within the lower lumbar spine. IMPRESSION: 1. No acute osseous abnormality about the right hip. 2. Question of focal lucent lesion measuring approximately 13 mm at the posterior right femoral neck, indeterminate. Differential considerations include osseous metastasis versus myeloma. Finding could be further assessed with dedicated MRI and/or bone scan as clinically warranted. Electronically Signed   By: Jeannine Boga M.D.   On: 11/07/2018 23:50    ASSESSMENT & PLAN:   #1 MGUS diagnosed in 2002  #2 Lesion on right femoral neck  11/07/2018 DG Hip Unilateral revealed "1. No acute osseous abnormality about the right hip. 2. Question of focal lucent lesion measuring approximately 13 mm at the posterior right femoral neck, indeterminate. Differential considerations include osseous metastasis versus myeloma. Finding could be further assessed with dedicated MRI and/or bone scan as clinically warranted."  #3 Right lower back/hip pain    PLAN: *** {-Discussed patient's most recent labs from 11/08/2018, mild anemia due to CKD -Based on the current scans, it is difficult  to tell whether his symptoms are due to a bone tumor or another cause. -His pain seems to be coming from the lower back, possibly a pinched nerve/sciatica -Labs today -Schedule PET scan and MRI L spine -Will see the pt back in 2 weeks by phone}   FOLLOW UP: ***    No orders of the defined types were placed in this encounter.   All of the patients questions were answered with apparent satisfaction. The patient knows to call the clinic with any problems, questions or concerns.  The total time spent in the appt was *** minutes and more than 50% was on counseling and direct patient  cares.   Sullivan Lone MD MS AAHIVMS [facility] Hematology/Oncology Physician [facility]  (Office):       [phone] (Work cell):  [phone] (Fax):           [phone]  11/29/2018 11:42 PM  I, Jacqualyn Posey, am acting as a Education administrator for Dr. Sullivan Lone.   {Add Barista Statement}

## 2018-11-30 ENCOUNTER — Inpatient Hospital Stay: Payer: Medicare HMO | Admitting: Hematology

## 2018-12-01 ENCOUNTER — Telehealth: Payer: Self-pay | Admitting: Hematology

## 2018-12-01 NOTE — Telephone Encounter (Signed)
No los per 8/18. °

## 2018-12-30 ENCOUNTER — Telehealth: Payer: Self-pay

## 2018-12-30 ENCOUNTER — Ambulatory Visit: Payer: Medicare HMO | Admitting: Internal Medicine

## 2018-12-30 NOTE — Telephone Encounter (Signed)
Patient was scheduled to see Dr.Reed today and missed appointment.  I called primary number and received recording stating that number is not a working number.   I called patient's brother and a lady answered the phone stating she is a friend of patient and his brother yet the number listed as his brothers number is her number.  The lady plans to relay message to Kevin Schwartz to return call to reschedule a missed appointment

## 2019-01-06 ENCOUNTER — Other Ambulatory Visit: Payer: Self-pay

## 2019-01-06 ENCOUNTER — Encounter: Payer: Self-pay | Admitting: Internal Medicine

## 2019-01-06 ENCOUNTER — Ambulatory Visit (INDEPENDENT_AMBULATORY_CARE_PROVIDER_SITE_OTHER): Payer: Medicare HMO | Admitting: Internal Medicine

## 2019-01-06 VITALS — BP 120/58 | HR 86 | Temp 98.1°F | Resp 18 | Ht 67.0 in | Wt 164.2 lb

## 2019-01-06 DIAGNOSIS — N183 Chronic kidney disease, stage 3 unspecified: Secondary | ICD-10-CM

## 2019-01-06 DIAGNOSIS — D472 Monoclonal gammopathy: Secondary | ICD-10-CM | POA: Diagnosis not present

## 2019-01-06 DIAGNOSIS — E1169 Type 2 diabetes mellitus with other specified complication: Secondary | ICD-10-CM

## 2019-01-06 DIAGNOSIS — E782 Mixed hyperlipidemia: Secondary | ICD-10-CM | POA: Diagnosis not present

## 2019-01-06 DIAGNOSIS — Z23 Encounter for immunization: Secondary | ICD-10-CM

## 2019-01-06 DIAGNOSIS — M48062 Spinal stenosis, lumbar region with neurogenic claudication: Secondary | ICD-10-CM | POA: Diagnosis not present

## 2019-01-06 DIAGNOSIS — M25551 Pain in right hip: Secondary | ICD-10-CM

## 2019-01-06 DIAGNOSIS — E1122 Type 2 diabetes mellitus with diabetic chronic kidney disease: Secondary | ICD-10-CM | POA: Diagnosis not present

## 2019-01-06 NOTE — Progress Notes (Signed)
Location:  [facility] clinic Provider:  Addysin Porco L. Mariea Clonts, D.O., C.M.D.  Code Status: DNR Goals of Care:  Advanced Directives 11/07/2018  Does Patient Have a Medical Advance Directive? No  Type of Advance Directive -  Does patient want to make changes to medical advance directive? -  Copy of [place] in Chart? -  Would patient like information on creating a medical advance directive? -     Chief Complaint  Patient presents with  . Medical Management of Chronic Issues    6 Month follow up, Advanced directive information given to patient    HPI: Patient is a 85 y.o. male seen today for medical management of chronic diseases.    After new onset right hip pain, he had xrays that showed:  The pt had a DG Hip Unilateral completed on 11/07/2018 which revealed "1. No acute osseous abnormality about the right hip. 2. Question of focal lucent lesion measuring approximately 13 mm at the posterior right femoral neck, indeterminate. Differential considerations include osseous metastasis versus myeloma. Finding could be further assessed with dedicated MRI and/or bone scan as clinically warranted."  He has MGUS that's been monitored, but he'd not been going regularly to heme/onc and did not consistently visit here either due to taking care of his brother and some financial issues he was having himself.  He was seen by Dr. Irene Limbo and MRIs suggested that he has spinal stenosis and some inflammation at the hip joint, but bony mets were not identified.  The hip pain has gradually gone away.    Appetite has been good.  Does not sleep well at night.  His brother with AD sleeps half the day and then gets up and he's concerned he will wander off.  He has life alert button.  He sometimes sets it off accidentally.    He got his flu shot today.  Reviewed importance of advance directives.  He has accepted the forms to complete.      He was seen at podiatry yesterday, but foot exam not done so he  needs this today.  Past Medical History:  Diagnosis Date  . Anemia due to chronic kidney disease   . Bilateral inguinal hernia   . CAD (coronary artery disease) cardiologist-  dr bensimhon   PTCA of OM in 1998, [place] OM in 2000, Cath 9/07 LM ok LAD ok. LCX 95% in OM prior to previous stent RCA. nondominant normal EF normal. Cypher DES to OM 2007 (PLACED PROXIAMAL TO PREVIOUS STENT)  . Chronic kidney disease, stage III (moderate) [facility])    nephrologist-  dr detarding  . Depression   . Diabetes mellitus type 2, diet-controlled ([place])    followed by pcp,  last A1c 5.2 on 09-10-2017 in epic  . Gout, unspecified    10-29-2017  per pt stable  . HLD (hyperlipidemia)   . HTN (hypertension)   . MGUS (monoclonal gammopathy of unknown significance) previously followed by dr Beryle Beams , lov and released 08/01/2011   IgG kappa dx 2002 8% plasma cells in bone marrow; no lesions on bone X-rays;  . Nocturia   . OA (osteoarthritis)   . OSA on CPAP    per study 01-30-2004  severe osa , AHI 51.6/hr  . PAD (peripheral artery disease) ([place])    05-21-2006  left renal artery stenosis, s/p balloon angioplasty and stenting;  last duplex 02/ 2012  normal , arteries patent  . S/P coronary artery stent placement    2000--  BMS  x1  to OM;   2007-- DES x1  to OM proximal to previous stent  . Secondary hyperparathyroidism of renal origin ([place])   . Urgency of urination   . Wears glasses     Past Surgical History:  Procedure Laterality Date  . CARDIOVASCULAR STRESS TEST  2010   per dr bensimhon epic note dated 04-18-2016  normal  . [place]   PTCA to OM  . CORONARY ANGIOPLASTY WITH STENT PLACEMENT  2000   PCI and BMS x1 OM  . CORONARY ANGIOPLASTY WITH STENT PLACEMENT  12-18-2005  dr Claiborne Billings   DES x1 to proximal to previously placed stent in OM  . HERNIA REPAIR    . INGUINAL HERNIA REPAIR Bilateral 10/30/2017   Procedure: LAPAROSCOPIC  BILATERAL INGUINAL HERNIA REPAIR  AND LEFT [place];  Surgeon: Michael Boston, MD;  Location: [place];  Service: General;  Laterality: Bilateral;  . INSERTION OF MESH Bilateral 10/30/2017   Procedure: INSERTION OF MESH;  Surgeon: Michael Boston, MD;  Location: [place];  Service: General;  Laterality: Bilateral;  . RENAL ANGIOPLASTY Left 05-21-2006   dr berry   and stenting  . TOTAL KNEE ARTHROPLASTY Left 07-22-2005   dr Shellia Carwin  [facility]  . TRANSTHORACIC ECHOCARDIOGRAM  04/18/2016   ef 70-35%, grade 1 diastolic dysfunction/  mild to moderate AR/  mild MR    Allergies  Allergen Reactions  . Lisinopril Swelling    angioedema  . Losartan Potassium Swelling    Angioedema   . Crestor [Rosuvastatin] Swelling    angioedema    Outpatient Encounter Medications as of 01/06/2019  Medication Sig  . ACCU-CHEK SOFTCLIX LANCETS lancets Use to test blood sugar daily. Dx:E11.8  . acetaminophen (TYLENOL) 325 MG tablet Take 1 tablet (325 mg total) by mouth every 8 (eight) hours as needed.  . Alcohol Swabs (B-D SINGLE USE SWABS REGULAR) PADS Use with testing of blood sugar. Dx:E11.8  . allopurinol (ZYLOPRIM) 100 MG tablet Take 1 tablet (100 mg total) by mouth daily.  [name] amLODipine (NORVASC) 5 MG tablet Take 1 tablet (5 mg total) by mouth daily.  [name] aspirin EC 81 MG tablet Take 81 mg by mouth daily.  [name] atorvastatin (LIPITOR) 80 MG tablet Take 1 tablet (80 mg total) by mouth daily.  . cephALEXin (KEFLEX) 250 MG capsule Take 1 capsule (250 mg total) by mouth 3 (three) times daily.  . citalopram (CELEXA) 20 MG tablet Take 1 tablet (20 mg total) by mouth daily.  [name] docusate sodium (COLACE) 100 MG capsule Take 1 capsule (100 mg total) by mouth every 12 (twelve) hours.  [name] glucose blood (ACCU-CHEK AVIVA PLUS) test strip Use to check blood sugar daily. Dx:E11.8  . metoprolol tartrate (LOPRESSOR) 50 MG tablet Take 1 tablet (50 mg total) by mouth 2 (two) times daily.  . nitroGLYCERIN (NITROSTAT) 0.4 MG SL tablet Place 0.4 mg  under the tongue every 5 (five) minutes as needed for chest pain. Up to 3 doses  . oxyCODONE-acetaminophen (PERCOCET) 5-325 MG tablet Take 1 tablet by mouth every 8 (eight) hours as needed for severe pain (if regular tylenol not controlling pain).  . predniSONE (DELTASONE) 5 MG tablet Take 6 pills for the first 4 days, 5 pills on day 5, 4 pills day 6, 3 pills day 7, 2 pills the day 8, and 1 pill day 9.  . ULTRA-THIN II MINI PEN NEEDLE 31G X 5 MM MISC    No facility-administered encounter  medications on file as of 01/06/2019.     Review of Systems:  Review of Systems  Constitutional: Negative for chills, fever and malaise/fatigue.  HENT: Positive for hearing loss.   Eyes: Negative for blurred vision.       Glasses  Respiratory: Negative for cough and shortness of breath.   Cardiovascular: Negative for chest pain, palpitations and leg swelling.  Genitourinary: Negative for dysuria.  Musculoskeletal: Positive for joint pain. Negative for falls.       Right hip pain is gradually improving  Skin: Negative for itching and rash.  Neurological: Negative for dizziness and loss of consciousness.  Endo/Heme/Allergies: Does not bruise/bleed easily.  Psychiatric/Behavioral: Negative for depression and memory loss. The patient has insomnia. The patient is not nervous/anxious.        Worries his brother will wander off--discussed Alzheimer's safe return program    Health Maintenance  Topic Date Due  . FOOT EXAM  09/15/2018  . INFLUENZA VACCINE  11/13/2018  . HEMOGLOBIN A1C  12/29/2018  . URINE MICROALBUMIN  06/28/2019  . OPHTHALMOLOGY EXAM  09/16/2019  . TETANUS/TDAP  04/14/2021  . PNA vac Low Risk Adult  Completed    Physical Exam: Vitals:   01/06/19 1246  BP: (!) 120/58  Pulse: 86  Resp: 18  Temp: 98.1 F (36.7 C)  TempSrc: Oral  SpO2: 96%  Weight: 164 lb 3.2 oz (74.5 kg)  Height: 5\' 7"  (1.702 m)   Body mass index is 25.72 kg/m. Physical Exam Vitals signs reviewed.   Constitutional:      General: He is not in acute distress.    Appearance: Normal appearance. He is not toxic-appearing.  HENT:     Head: Normocephalic and atraumatic.     Ears:     Comments: HOH Eyes:     Comments: glasses  Cardiovascular:     Rate and Rhythm: Normal rate and regular rhythm.     Pulses: Normal pulses.     Heart sounds: Normal heart sounds.  Pulmonary:     Effort: Pulmonary effort is normal.     Breath sounds: Normal breath sounds.  Abdominal:     General: Bowel sounds are normal. There is no distension.     Palpations: There is no mass.     Tenderness: There is no abdominal tenderness.  Musculoskeletal: Normal range of motion.     Right lower leg: No edema.     Left lower leg: No edema.     Comments: Stooped posture, takes small steps  Skin:    General: Skin is warm and dry.  Neurological:     General: No focal deficit present.     Mental Status: He is alert and oriented to person, place, and time. Mental status is at baseline.  Psychiatric:        Mood and Affect: Mood normal.     Labs reviewed: Basic Metabolic Panel: Recent Labs    06/28/18 1557 11/08/18 1623 11/16/18 1245  NA 141 137 140  K 4.8 4.5 4.1  CL 111* 104 107  CO2 24 26 25   GLUCOSE 117 103 96  BUN 23 20 18   CREATININE 1.88* 1.50* 1.30*  CALCIUM 9.2 9.5 9.7   Liver Function Tests: Recent Labs    06/28/18 1557 11/08/18 1623 11/16/18 1245  AST 20 22 18   ALT 15 15 13   ALKPHOS  --   --  70  BILITOT 0.4 0.5 0.4  PROT 7.0 7.3  7.3 7.8  ALBUMIN  --   --  4.2   No results for input(s): LIPASE, AMYLASE in the last 8760 hours. No results for input(s): AMMONIA in the last 8760 hours. CBC: Recent Labs    06/28/18 1557 11/08/18 1623 11/16/18 1245  WBC 6.9 7.5 7.9  NEUTROABS 4,685 5,640 6.4  HGB 11.8* 11.6* 12.0*  HCT 34.7* 34.5* 36.6*  MCV 92.5 92.0 93.1  PLT 318 316 343   Lipid Panel: No results for input(s): CHOL, HDL, LDLCALC, TRIG, CHOLHDL, LDLDIRECT in the last 8760  hours. Lab Results  Component Value Date   HGBA1C 5.5 06/28/2018    Procedures since last visit: Reviewed MRI and PET from August and heme/onc appt 8/4 with Dr. Irene Limbo  Assessment/Plan 1. Controlled type 2 diabetes mellitus with stage 3 chronic kidney disease, without long-term current use of insulin (HCC) - has been well controlled -intake not as good as it once was -BMI now ok, had been dropping weight when did not have good access to food at one point--now stable -Avoid nephrotoxic agents like nsaids, dose adjust renally excreted meds, hydrate. - CBC with Differential/Platelet; Future - COMPLETE METABOLIC PANEL WITH GFR; Future - Hemoglobin A1c; Future - Lipid panel; Future  2. MGUS (monoclonal gammopathy of unknown significance) -just had labs with Dr. Irene Limbo and has not progressed into myeloma  - CBC with Differential/Platelet; Future  3. Mixed hyperlipidemia due to type 2 diabetes mellitus ([place]) - check lab before next appt -had evidence of CAD and aortic atherosclerosis on his MRI that were noted incidentally - Lipid panel; Future  4. Chronic kidney disease, stage III (moderate) (HCC) -Avoid nephrotoxic agents like nsaids, dose adjust renally excreted meds, hydrate. - COMPLETE METABOLIC PANEL WITH GFR; Future  5. Right hip pain -improving gradually since onset in July -had lesion of concern on xray that turned out to be inflammation at hip joint on MRI and MRI lumbar showed spinal stenosis -he's not c/o discomfort or wanting further intervention now  6. Spinal stenosis of lumbar region with neurogenic claudication -noted on MRI, does walk stooped over but does not c/o back pain and hip pain better now  7. Need for influenza vaccination - Flu Vaccine QUAD High Dose(Fluad) given  Labs/tests ordered:   Lab Orders     CBC with Differential/Platelet     COMPLETE METABOLIC PANEL WITH GFR     Hemoglobin A1c     Lipid panel  Next appt:  05/09/2019 med mgt, fasting labs  before  Chaske Paskett L. Demitri Kucinski, D.O. [facility] [address], [place] [address] Cell Phone (Mon-Fri 8am-5pm):  [phone] On Call:  [phone] & follow prompts after 5pm & weekends Office Phone:  [phone] Office Fax:  [phone]

## 2019-01-06 NOTE — Patient Instructions (Signed)
If we get some ensure, we will call you.

## 2019-01-14 ENCOUNTER — Other Ambulatory Visit: Payer: Self-pay | Admitting: Family

## 2019-04-22 ENCOUNTER — Other Ambulatory Visit: Payer: Self-pay

## 2019-04-22 ENCOUNTER — Encounter (HOSPITAL_COMMUNITY): Payer: Self-pay | Admitting: Family Medicine

## 2019-04-22 ENCOUNTER — Ambulatory Visit (HOSPITAL_COMMUNITY)
Admission: EM | Admit: 2019-04-22 | Discharge: 2019-04-22 | Disposition: A | Discharge status: Home / Self Care | Payer: Medicare HMO | Attending: Family Medicine | Admitting: Family Medicine

## 2019-04-22 DIAGNOSIS — M79641 Pain in right hand: Secondary | ICD-10-CM | POA: Diagnosis not present

## 2019-04-22 DIAGNOSIS — M10441 Other secondary gout, right hand: Secondary | ICD-10-CM

## 2019-04-22 MED ORDER — ALLOPURINOL 100 MG PO TABS: 100.0000 mg | ORAL_TABLET | Freq: Every day | ORAL | 3 refills | Status: DC

## 2019-04-22 MED ORDER — PREDNISONE 50 MG PO TABS: ORAL_TABLET | ORAL | 1 refills | Status: DC

## 2019-04-22 NOTE — ED Triage Notes (Signed)
Pt. Is here with right hand pain that started yesterday. States he has previously been here for this.

## 2019-04-22 NOTE — ED Provider Notes (Signed)
[place]    CSN: [id number] Arrival date & time: 04/22/19  1125      History   Chief Complaint Chief Complaint  Patient presents with  . Hand Pain    HPI Kevin Schwartz is a 86 y.o. male.   86 yo established [place] patient presents with hand pain.  Patient has h/o diabetes and gout.  He is being treated for ASCVD in addition.  Patient says that his right dorsal wrist started swelling yesterday with pain on movement or palpation.  He was taken off his allopurinol sometime back for unknown reasons.  He said he was doing well when he was taking it.     Past Medical History:  Diagnosis Date  . Anemia due to chronic kidney disease   . Bilateral inguinal hernia   . CAD (coronary artery disease) cardiologist-  dr bensimhon   PTCA of OM in 1998, [place] OM in 2000, Cath 9/07 LM ok LAD ok. LCX 95% in OM prior to previous stent RCA. nondominant normal EF normal. [place] DES to OM 2007 (PLACED PROXIAMAL TO PREVIOUS STENT)  . Chronic kidney disease, stage III (moderate)    nephrologist-  dr detarding  . Depression   . Diabetes mellitus type 2, diet-controlled ([place])    followed by pcp,  last A1c 5.2 on 09-10-2017 in epic  . Gout, unspecified    10-29-2017  per pt stable  . HLD (hyperlipidemia)   . HTN (hypertension)   . MGUS (monoclonal gammopathy of unknown significance) previously followed by dr Beryle Beams , lov and released 08/01/2011   IgG kappa dx 2002 8% plasma cells in bone marrow; no lesions on bone X-rays;  . Nocturia   . OA (osteoarthritis)   . OSA on CPAP    per study 01-30-2004  severe osa , AHI 51.6/hr  . PAD (peripheral artery disease) ([place])    05-21-2006  left renal artery stenosis, s/p balloon angioplasty and stenting;  last duplex 02/ 2012  normal , arteries patent  . S/P coronary artery stent placement    2000--  BMS x1  to OM;   2007-- DES x1  to OM proximal to previous stent  . Secondary hyperparathyroidism of renal origin ([place])   . Urgency  of urination   . Wears glasses     Patient Active Problem List   Diagnosis Date Noted  . Bilateral inguinal hernia s/p lap hernia repair w mesh 10/30/2017 10/30/2017  . Left femoral hernia s/p lap hernia repair w mesh 10/30/2017 10/30/2017  . Loss of weight 01/21/2016  . Heme positive stool 01/21/2016  . Heart murmur 08/18/2013  . Chronic kidney disease, stage III (moderate) 07/14/2013  . Hyperlipidemia LDL goal < 100 07/14/2013  . Essential hypertension, benign 07/14/2013  . Type II or unspecified type diabetes mellitus with renal manifestations, not stated as uncontrolled(250.40) 07/14/2013  . Insomnia 07/14/2013  . Angioedema of lips 12/26/2012  . MGUS (monoclonal gammopathy of unknown significance) 08/01/2011  . Gout 08/01/2011  . S/P coronary artery stent placement 08/01/2011  . DM type 2 causing CKD stage 2 ([place]) 08/01/2011  . Bradycardia 10/15/2010  . Mixed hyperlipidemia due to type 2 diabetes mellitus ([place]) 05/01/2010  . HYPERTENSION, UNSPECIFIED 05/01/2010  . CAD, NATIVE VESSEL 05/01/2010  . PVD 05/01/2010    Past Surgical History:  Procedure Laterality Date  . CARDIOVASCULAR STRESS TEST  2010   per dr bensimhon epic note dated 04-18-2016  normal  . [place]  PTCA to OM  . CORONARY ANGIOPLASTY WITH STENT PLACEMENT  2000   PCI and BMS x1 OM  . CORONARY ANGIOPLASTY WITH STENT PLACEMENT  12-18-2005  dr Claiborne Billings   DES x1 to proximal to previously placed stent in OM  . HERNIA REPAIR    . INGUINAL HERNIA REPAIR Bilateral 10/30/2017   Procedure: LAPAROSCOPIC  BILATERAL INGUINAL HERNIA REPAIR  AND LEFT [place];  Surgeon: Michael Boston, MD;  Location: [place];  Service: General;  Laterality: Bilateral;  . INSERTION OF MESH Bilateral 10/30/2017   Procedure: INSERTION OF MESH;  Surgeon: Michael Boston, MD;  Location: [place];  Service: General;  Laterality: Bilateral;  . RENAL ANGIOPLASTY Left 05-21-2006   dr  berry   and stenting  . TOTAL KNEE ARTHROPLASTY Left 07-22-2005   dr Shellia Carwin  [facility]  . TRANSTHORACIC ECHOCARDIOGRAM  04/18/2016   ef 35-57%, grade 1 diastolic dysfunction/  mild to moderate AR/  mild MR       Home Medications    Prior to Admission medications   Medication Sig Start Date End Date Taking? Authorizing Provider  ACCU-CHEK SOFTCLIX LANCETS lancets Use to test blood sugar daily. Dx:E11.8 03/25/17   Reed, Tiffany L, DO  acetaminophen (TYLENOL) 325 MG tablet Take 1 tablet (325 mg total) by mouth every 8 (eight) hours as needed. 11/08/18   Mesner, Corene Cornea, MD  Alcohol Swabs (B-D SINGLE USE SWABS REGULAR) PADS Use with testing of blood sugar. Dx:E11.8 03/25/17   Reed, Tiffany L, DO  allopurinol (ZYLOPRIM) 100 MG tablet Take 1 tablet (100 mg total) by mouth daily. 04/22/19   Robyn Haber, MD  amLODipine (NORVASC) 5 MG tablet Take 1 tablet (5 mg total) by mouth daily. 06/11/18   Ngetich, Dinah C, NP  aspirin EC 81 MG tablet Take 81 mg by mouth daily.    [provider]  atorvastatin (LIPITOR) 80 MG tablet Take 1 tablet (80 mg total) by mouth daily. 09/13/18   Reed, Tiffany L, DO  citalopram (CELEXA) 20 MG tablet Take 1 tablet (20 mg total) by mouth daily. 06/11/18   Ngetich, Dinah C, NP  docusate sodium (COLACE) 100 MG capsule Take 1 capsule (100 mg total) by mouth every 12 (twelve) hours. 11/08/18   Mesner, Corene Cornea, MD  glucose blood (ACCU-CHEK AVIVA PLUS) test strip Use to check blood sugar daily. Dx:E11.8 03/25/17   Reed, Tiffany L, DO  metoprolol tartrate (LOPRESSOR) 50 MG tablet TAKE 1 TABLET TWICE DAILY 01/14/19   Reed, Tiffany L, DO  nitroGLYCERIN (NITROSTAT) 0.4 MG SL tablet Place 0.4 mg under the tongue every 5 (five) minutes as needed for chest pain. Up to 3 doses    [provider]  predniSONE (DELTASONE) 50 MG tablet One daily with food 04/22/19   Robyn Haber, MD  ULTRA-THIN II MINI PEN NEEDLE 31G X 5 MM MISC  08/29/14   [provider]    Family  History Family History  Problem Relation Age of Onset  . Heart disease Mother   . Healthy Father   . Heart disease Sister   . Heart disease Sister   . Cancer Sister        type unknown  . Hypertension Sister   . Diabetes Brother   . Diabetes Brother   . Diabetes Other   . Coronary artery disease Other   . Cancer Other     Social History Social History   Tobacco Use  . Smoking status: Former Smoker  Years: 54.00    Types: Cigarettes    Quit date: 09/29/2017    Years since quitting: 1.5  . Smokeless tobacco: Never Used  . Tobacco comment: 10-29-2017  per pt was smoking 1pp2d, stopped June 2019  Substance Use Topics  . Alcohol use: Not Currently  . Drug use: No     Allergies   Lisinopril, Losartan potassium, and Crestor [rosuvastatin]   Review of Systems Review of Systems  Musculoskeletal: Positive for joint swelling.  All other systems reviewed and are negative.    Physical Exam Triage Vital Signs ED Triage Vitals  Enc Vitals Group     BP      Pulse      Resp      Temp      Temp src      SpO2      Weight      Height      Head Circumference      Peak Flow      Pain Score      Pain Loc      Pain Edu?      Excl. in [place]?    No data found.  Updated Vital Signs BP (!) 167/71 (BP Location: Left Arm)   Pulse 60   Temp 98.3 F (36.8 C) (Oral)   Resp 17   Wt 75.3 kg   SpO2 99%   BMI 26.00 kg/m    Physical Exam Vitals and nursing note reviewed.  Constitutional:      General: He is not in acute distress.    Appearance: Normal appearance. He is normal weight. He is not ill-appearing.  Pulmonary:     Effort: Pulmonary effort is normal.  Musculoskeletal:        General: Swelling and tenderness present. No signs of injury.     Cervical back: Normal range of motion and neck supple.     Comments: Dorsal right wrist is swollen and mildly tender.  Range of motion is extremely limited with minimal flexion and extension.  Skin:    General: Skin is  warm and dry.  Neurological:     General: No focal deficit present.     Mental Status: He is alert.  Psychiatric:        Mood and Affect: Mood normal.        Thought Content: Thought content normal.      UC Treatments / Results  Labs (all labs ordered are listed, but only abnormal results are displayed) Labs Reviewed - No data to display  EKG   Radiology No results found.  Procedures Procedures (including critical care time)  Medications Ordered in UC Medications - No data to display  Initial Impression / Assessment and Plan / UC Course  I have reviewed the triage vital signs and the nursing notes.  Pertinent labs & imaging results that were available during my care of the patient were reviewed by me and considered in my medical decision making (see chart for details).    Final Clinical Impressions(s) / UC Diagnoses   Final diagnoses:  Hand pain, right  Other secondary acute gout of right hand   Discharge Instructions   None    ED Prescriptions    Medication Sig Dispense Auth. Provider   predniSONE (DELTASONE) 50 MG tablet One daily with food 5 tablet Robyn Haber, MD   allopurinol (ZYLOPRIM) 100 MG tablet Take 1 tablet (100 mg total) by mouth daily. 90 tablet Robyn Haber, MD     I  have reviewed the PDMP during this encounter.   Robyn Haber, MD 04/22/19 1237

## 2019-05-03 ENCOUNTER — Other Ambulatory Visit: Payer: Self-pay | Admitting: Internal Medicine

## 2019-05-05 ENCOUNTER — Other Ambulatory Visit: Payer: Medicare HMO

## 2019-05-05 ENCOUNTER — Other Ambulatory Visit: Payer: Self-pay

## 2019-05-05 DIAGNOSIS — E1169 Type 2 diabetes mellitus with other specified complication: Secondary | ICD-10-CM

## 2019-05-05 DIAGNOSIS — D472 Monoclonal gammopathy: Secondary | ICD-10-CM

## 2019-05-05 DIAGNOSIS — N183 Chronic kidney disease, stage 3 unspecified: Secondary | ICD-10-CM | POA: Diagnosis not present

## 2019-05-05 DIAGNOSIS — E1122 Type 2 diabetes mellitus with diabetic chronic kidney disease: Secondary | ICD-10-CM | POA: Diagnosis not present

## 2019-05-05 DIAGNOSIS — E782 Mixed hyperlipidemia: Secondary | ICD-10-CM | POA: Diagnosis not present

## 2019-05-06 LAB — CBC WITH DIFFERENTIAL/PLATELET
Absolute Monocytes: 723 cells/uL (ref 200–950)
Basophils Absolute: 17 cells/uL (ref 0–200)
Basophils Relative: 0.2 %
Eosinophils Absolute: 221 cells/uL (ref 15–500)
Eosinophils Relative: 2.6 %
HCT: 33.1 % — ABNORMAL LOW (ref 38.5–50.0)
Hemoglobin: 10.8 g/dL — ABNORMAL LOW (ref 13.2–17.1)
Lymphs Abs: 1114 cells/uL (ref 850–3900)
MCH: 30.1 pg (ref 27.0–33.0)
MCHC: 32.6 g/dL (ref 32.0–36.0)
MCV: 92.2 fL (ref 80.0–100.0)
MPV: 10.1 fL (ref 7.5–12.5)
Monocytes Relative: 8.5 %
Neutro Abs: 6426 cells/uL (ref 1500–7800)
Neutrophils Relative %: 75.6 %
Platelets: 384 10*3/uL (ref 140–400)
RBC: 3.59 10*6/uL — ABNORMAL LOW (ref 4.20–5.80)
RDW: 13.3 % (ref 11.0–15.0)
Total Lymphocyte: 13.1 %
WBC: 8.5 10*3/uL (ref 3.8–10.8)

## 2019-05-06 LAB — COMPLETE METABOLIC PANEL WITH GFR
AG Ratio: 1.4 (calc) (ref 1.0–2.5)
ALT: 13 U/L (ref 9–46)
AST: 17 U/L (ref 10–35)
Albumin: 3.9 g/dL (ref 3.6–5.1)
Alkaline phosphatase (APISO): 60 U/L (ref 35–144)
BUN/Creatinine Ratio: 15 (calc) (ref 6–22)
BUN: 22 mg/dL (ref 7–25)
CO2: 23 mmol/L (ref 20–32)
Calcium: 9.1 mg/dL (ref 8.6–10.3)
Chloride: 108 mmol/L (ref 98–110)
Creat: 1.48 mg/dL — ABNORMAL HIGH (ref 0.70–1.11)
GFR, Est African American: 49 mL/min/{1.73_m2} — ABNORMAL LOW (ref >=60)
GFR, Est Non African American: 42 mL/min/{1.73_m2} — ABNORMAL LOW (ref >=60)
Globulin: 2.8 g/dL (calc) (ref 1.9–3.7)
Glucose, Bld: 87 mg/dL (ref 65–99)
Potassium: 4.1 mmol/L (ref 3.5–5.3)
Sodium: 140 mmol/L (ref 135–146)
Total Bilirubin: 0.4 mg/dL (ref 0.2–1.2)
Total Protein: 6.7 g/dL (ref 6.1–8.1)

## 2019-05-06 LAB — LIPID PANEL
Cholesterol: 146 mg/dL (ref <200)
HDL: 50 mg/dL (ref >=40)
LDL Cholesterol (Calc): 80 mg/dL (calc)
Non-HDL Cholesterol (Calc): 96 mg/dL (calc) (ref <130)
Total CHOL/HDL Ratio: 2.9 (calc) (ref <5.0)
Triglycerides: 77 mg/dL (ref <150)

## 2019-05-06 LAB — HEMOGLOBIN A1C
Hgb A1c MFr Bld: 5.5 % of total Hgb (ref <5.7)
Mean Plasma Glucose: 111 (calc)
eAG (mmol/L): 6.2 (calc)

## 2019-05-06 NOTE — Progress Notes (Signed)
Labs stable or improved except some worsening anemia.  Will review all with him at his appt.

## 2019-05-08 ENCOUNTER — Ambulatory Visit: Payer: Medicare HMO

## 2019-05-09 ENCOUNTER — Encounter: Payer: Self-pay | Admitting: Internal Medicine

## 2019-05-09 ENCOUNTER — Ambulatory Visit (INDEPENDENT_AMBULATORY_CARE_PROVIDER_SITE_OTHER): Payer: Medicare HMO | Admitting: Internal Medicine

## 2019-05-09 ENCOUNTER — Other Ambulatory Visit: Payer: Self-pay

## 2019-05-09 VITALS — BP 130/66 | HR 62 | Temp 96.6°F | Ht 67.0 in | Wt 164.2 lb

## 2019-05-09 DIAGNOSIS — M10431 Other secondary gout, right wrist: Secondary | ICD-10-CM

## 2019-05-09 DIAGNOSIS — E782 Mixed hyperlipidemia: Secondary | ICD-10-CM

## 2019-05-09 DIAGNOSIS — N183 Chronic kidney disease, stage 3 unspecified: Secondary | ICD-10-CM

## 2019-05-09 DIAGNOSIS — Z636 Dependent relative needing care at home: Secondary | ICD-10-CM

## 2019-05-09 DIAGNOSIS — E1122 Type 2 diabetes mellitus with diabetic chronic kidney disease: Secondary | ICD-10-CM

## 2019-05-09 DIAGNOSIS — E1169 Type 2 diabetes mellitus with other specified complication: Secondary | ICD-10-CM

## 2019-05-09 DIAGNOSIS — R634 Abnormal weight loss: Secondary | ICD-10-CM

## 2019-05-09 DIAGNOSIS — D472 Monoclonal gammopathy: Secondary | ICD-10-CM | POA: Diagnosis not present

## 2019-05-09 NOTE — Progress Notes (Signed)
Location:  [facility] clinic  Provider: Dr. Hollace Kinnier  Goals of Care:  Advanced Directives 11/07/2018  Does Patient Have a Medical Advance Directive? No  Type of Advance Directive -  Does patient want to make changes to medical advance directive? -  Copy of [place] in Chart? -  Would patient like information on creating a medical advance directive? -     No chief complaint on file.   HPI: Patient is a 86 y.o. male seen today for medical management of chronic diseases.    Labs reviewed with patient.   Earlier this month he sought treatment with [facility] for right hand pain. He was diagnosed with secondary acute gout and given a prescription of prednisone and allopurinol. Since visit he is still taking both medications. States the pain and swelling have subsided since starting them.   Right hip pain has subsided. States hip started feeling better in December 2020.   Still taking care of brother, has been doing it for 2 year. He has a agency coming to help him starting last Wednesday. They visit three times a week for a few hours and mainly do house chores and cooking. Claims he is depressed at times because taking care of his brother is challenging. His brother will keep him up at night because of confusion.   Claims he is checking his sugar but cannot state what they are ranging.   Covid vaccine was scheduled yesterday, but it was cancelled. Would like to have his vaccine ASAP.   No recent falls or injuries.   Eats three meals a day. Follows a diet low in sodium and fat. Drinks plenty of water daily.             Past Medical History:  Diagnosis Date  . Anemia due to chronic kidney disease   . Bilateral inguinal hernia   . CAD (coronary artery disease) cardiologist-  dr bensimhon   PTCA of OM in 1998, [place] OM in 2000, Cath 9/07 LM ok LAD ok. LCX 95% in OM prior to previous stent RCA. nondominant normal EF normal. Cypher DES to OM 2007 (PLACED PROXIAMAL  TO PREVIOUS STENT)  . Chronic kidney disease, stage III (moderate)    nephrologist-  dr detarding  . Depression   . Diabetes mellitus type 2, diet-controlled ([place])    followed by pcp,  last A1c 5.2 on 09-10-2017 in epic  . Gout, unspecified    10-29-2017  per pt stable  . HLD (hyperlipidemia)   . HTN (hypertension)   . MGUS (monoclonal gammopathy of unknown significance) previously followed by dr Beryle Beams , lov and released 08/01/2011   IgG kappa dx 2002 8% plasma cells in bone marrow; no lesions on bone X-rays;  . Nocturia   . OA (osteoarthritis)   . OSA on CPAP    per study 01-30-2004  severe osa , AHI 51.6/hr  . PAD (peripheral artery disease) ([place])    05-21-2006  left renal artery stenosis, s/p balloon angioplasty and stenting;  last duplex 02/ 2012  normal , arteries patent  . S/P coronary artery stent placement    2000--  BMS x1  to OM;   2007-- DES x1  to OM proximal to previous stent  . Secondary hyperparathyroidism of renal origin ([place])   . Urgency of urination   . Wears glasses     Past Surgical History:  Procedure Laterality Date  . CARDIOVASCULAR STRESS TEST  2010   per dr bensimhon epic  note dated 04-18-2016  normal  . [place]   PTCA to OM  . CORONARY ANGIOPLASTY WITH STENT PLACEMENT  2000   PCI and BMS x1 OM  . CORONARY ANGIOPLASTY WITH STENT PLACEMENT  12-18-2005  dr Claiborne Billings   DES x1 to proximal to previously placed stent in OM  . HERNIA REPAIR    . INGUINAL HERNIA REPAIR Bilateral 10/30/2017   Procedure: LAPAROSCOPIC  BILATERAL INGUINAL HERNIA REPAIR  AND LEFT [place];  Surgeon: Michael Boston, MD;  Location: [place];  Service: General;  Laterality: Bilateral;  . INSERTION OF MESH Bilateral 10/30/2017   Procedure: INSERTION OF MESH;  Surgeon: Michael Boston, MD;  Location: [place];  Service: General;  Laterality: Bilateral;  . RENAL ANGIOPLASTY Left 05-21-2006   dr berry   and stenting  .  TOTAL KNEE ARTHROPLASTY Left 07-22-2005   dr Shellia Carwin  [facility]  . TRANSTHORACIC ECHOCARDIOGRAM  04/18/2016   ef 09-32%, grade 1 diastolic dysfunction/  mild to moderate AR/  mild MR    Allergies  Allergen Reactions  . Lisinopril Swelling    angioedema  . Losartan Potassium Swelling    Angioedema   . Crestor [Rosuvastatin] Swelling    angioedema    Outpatient Encounter Medications as of 05/09/2019  Medication Sig  . ACCU-CHEK SOFTCLIX LANCETS lancets Use to test blood sugar daily. Dx:E11.8  . acetaminophen (TYLENOL) 325 MG tablet Take 1 tablet (325 mg total) by mouth every 8 (eight) hours as needed.  . Alcohol Swabs (B-D SINGLE USE SWABS REGULAR) PADS Use with testing of blood sugar. Dx:E11.8  . allopurinol (ZYLOPRIM) 100 MG tablet Take 1 tablet (100 mg total) by mouth daily.  [name] amLODipine (NORVASC) 5 MG tablet Take 1 tablet (5 mg total) by mouth daily.  [name] aspirin EC 81 MG tablet Take 81 mg by mouth daily.  [name] atorvastatin (LIPITOR) 80 MG tablet TAKE 1 TABLET (80 MG TOTAL) BY MOUTH DAILY.  . citalopram (CELEXA) 20 MG tablet Take 1 tablet (20 mg total) by mouth daily.  [name] docusate sodium (COLACE) 100 MG capsule Take 1 capsule (100 mg total) by mouth every 12 (twelve) hours.  [name] glucose blood (ACCU-CHEK AVIVA PLUS) test strip Use to check blood sugar daily. Dx:E11.8  . metoprolol tartrate (LOPRESSOR) 50 MG tablet TAKE 1 TABLET TWICE DAILY  . nitroGLYCERIN (NITROSTAT) 0.4 MG SL tablet Place 0.4 mg under the tongue every 5 (five) minutes as needed for chest pain. Up to 3 doses  . predniSONE (DELTASONE) 50 MG tablet One daily with food  . ULTRA-THIN II MINI PEN NEEDLE 31G X 5 MM MISC    No facility-administered encounter medications on file as of 05/09/2019.    Review of Systems:  Review of Systems  Constitutional: Negative for activity change, appetite change and fatigue.  HENT: Positive for hearing loss. Negative for dental problem and trouble swallowing.   Eyes: Negative for photophobia  and visual disturbance.  Respiratory: Negative for cough, shortness of breath and wheezing.   Cardiovascular: Negative for chest pain.  Gastrointestinal: Negative for abdominal pain, constipation, diarrhea and nausea.  Genitourinary: Positive for frequency. Negative for dysuria and hematuria.  Musculoskeletal:       Right hand pain  Skin: Negative.   Neurological: Negative for dizziness, weakness and headaches.  Psychiatric/Behavioral: Positive for dysphoric mood. Negative for sleep disturbance. The patient is not nervous/anxious.        Caregiver strain    Stage manager  Date Due  . URINE MICROALBUMIN  06/28/2019  . OPHTHALMOLOGY EXAM  09/16/2019  . HEMOGLOBIN A1C  11/02/2019  . FOOT EXAM  01/06/2020  . TETANUS/TDAP  04/14/2021  . INFLUENZA VACCINE  Completed  . PNA vac Low Risk Adult  Completed    Physical Exam: There were no vitals filed for this visit. There is no height or weight on file to calculate BMI. Physical Exam Vitals reviewed.  Constitutional:      General: He is not in acute distress.    Appearance: Normal appearance. He is normal weight.  Cardiovascular:     Rate and Rhythm: Regular rhythm. Bradycardia present.     Pulses: Normal pulses.     Heart sounds: Normal heart sounds. No murmur.  Pulmonary:     Effort: Pulmonary effort is normal. No respiratory distress.     Breath sounds: Normal breath sounds. No wheezing.  Abdominal:     General: Abdomen is flat. Bowel sounds are normal.     Palpations: Abdomen is soft.  Musculoskeletal:     Right wrist: Swelling present. No deformity or tenderness. Normal range of motion.     Right lower leg: No edema.     Left lower leg: No edema.  Skin:    General: Skin is warm and dry.     Capillary Refill: Capillary refill takes less than 2 seconds.  Neurological:     General: No focal deficit present.     Mental Status: He is alert and oriented to person, place, and time. Mental status is at baseline.    Psychiatric:        Mood and Affect: Mood normal.        Behavior: Behavior normal.        Thought Content: Thought content normal.        Judgment: Judgment normal.     Labs reviewed: Basic Metabolic Panel: Recent Labs    11/08/18 1623 11/16/18 1245 05/05/19 1005  NA 137 140 140  K 4.5 4.1 4.1  CL 104 107 108  CO2 26 25 23   GLUCOSE 103 96 87  BUN 20 18 22   CREATININE 1.50* 1.30* 1.48*  CALCIUM 9.5 9.7 9.1   Liver Function Tests: Recent Labs    11/08/18 1623 11/16/18 1245 05/05/19 1005  AST 22 18 17   ALT 15 13 13   ALKPHOS  --  70  --   BILITOT 0.5 0.4 0.4  PROT 7.3  7.3 7.8 6.7  ALBUMIN  --  4.2  --    No results for input(s): LIPASE, AMYLASE in the last 8760 hours. No results for input(s): AMMONIA in the last 8760 hours. CBC: Recent Labs    11/08/18 1623 11/16/18 1245 05/05/19 1005  WBC 7.5 7.9 8.5  NEUTROABS 5,640 6.4 6,426  HGB 11.6* 12.0* 10.8*  HCT 34.5* 36.6* 33.1*  MCV 92.0 93.1 92.2  PLT 316 343 384   Lipid Panel: Recent Labs    05/05/19 1005  CHOL 146  HDL 50  LDLCALC 80  TRIG 77  CHOLHDL 2.9   Lab Results  Component Value Date   HGBA1C 5.5 05/05/2019    Procedures since last visit: No results found.  Assessment/Plan 1. Controlled type 2 diabetes mellitus with stage 3 chronic kidney disease, without long-term current use of insulin (HCC) - stable, well controlled, A1C 5.5 - he is maintaining a healthy weight and diet - continue to avoid nephrotoxic agents like nsaids, and dose adjust renally excreted meds, encourage hydration - cbc  with differential/platelets- future - complete metabolic panel with GFR- future - hemoglobin A1C- future - lipid panel- future  2. MGUS (monoclonal gammopathy of unknown significance) - followed by Dr. Irene Limbo - stable at this time, has not progressed to myeloma  - cbc with differential/platelets- future  3. Mixed hyperlipidemia due to type 2 diabetes mellitus (HCC) - LDL at goal<100 - continue  diet low in fat and fried foods - continue current statin regimen - lipid panel- future  4. Other secondary acute gout of right wrist - he has CKD stage III, suspect this is main contributing factor - right wrist mildly swollen with full ROM - finish prednisone  - continue allopurinol  5. Caregiver stress - he has hired help for his brother last week - will see next visit if home health is helping him - advised to contact PCP if feelings of blues worsen.    Labs/tests ordered: cbc with differential/platelets, complete metabolic panel with GFR, hemoglobin A1C, lipid panel- future Next appt:  05/31/2019

## 2019-05-09 NOTE — Patient Instructions (Addendum)
COVID-19 Vaccine Information can be found at: [email or web address] For questions related to vaccine distribution or appointments, please email [email or web address] or call [phone].   You should get a call to reschedule your covid vaccine.

## 2019-05-09 NOTE — Addendum Note (Signed)
Addended by: Gayland Curry on: 05/09/2019 04:18 PM   Modules accepted: Orders

## 2019-05-31 ENCOUNTER — Encounter: Payer: Self-pay | Admitting: Family

## 2019-05-31 ENCOUNTER — Ambulatory Visit (INDEPENDENT_AMBULATORY_CARE_PROVIDER_SITE_OTHER): Payer: Medicare HMO | Admitting: Family

## 2019-05-31 ENCOUNTER — Other Ambulatory Visit: Payer: Self-pay

## 2019-05-31 DIAGNOSIS — Z0000 Encounter for general adult medical examination without abnormal findings: Secondary | ICD-10-CM

## 2019-05-31 NOTE — Progress Notes (Signed)
Subjective:   Kevin Schwartz is a 86 y.o. male who presents for Medicare Annual/Subsequent preventive examination.  Review of Systems:   Cardiac Risk Factors include: advanced age (>72men, >82 women);hypertension;dyslipidemia;male gender;family history of premature cardiovascular disease;smoking/ tobacco exposure     Objective:    Vitals: There were no vitals taken for this visit.  There is no height or weight on file to calculate BMI.  Advanced Directives 05/31/2019 05/09/2019 11/07/2018 05/26/2018 10/30/2017 09/14/2017 05/25/2017  Does Patient Have a Medical Advance Directive? Yes Yes No Yes No No No  Type of Advance Directive - Living will;Healthcare Power of [place]  Does patient want to make changes to medical advance directive? No - Patient declined No - Patient declined - No - Patient declined - - -  Copy of [place] in Chart? No - copy requested No - copy requested - No - copy requested - - -  Would patient like information on creating a medical advance directive? - - - - No - Patient declined No - Patient declined Yes (MAU/Ambulatory/Procedural Areas - Information given)    Tobacco Social History   Tobacco Use  Smoking Status Former Smoker  . Years: 54.00  . Types: Cigarettes  . Quit date: 09/29/2017  . Years since quitting: 1.6  Smokeless Tobacco Never Used  Tobacco Comment   10-29-2017  per pt was smoking 1pp2d, stopped June 2019     Counseling given: Not Answered Comment: 10-29-2017  per pt was smoking 1pp2d, stopped June 2019   Clinical Intake:  Pre-visit preparation completed: No  Pain : No/denies pain     BMI - recorded: 25.72 Nutritional Status: BMI 25 -29 Overweight Nutritional Risks: Nausea/ vomitting/ diarrhea Diabetes: No  How often do you need to have someone help you when you read instructions, pamphlets, or other written materials from your doctor or pharmacy?: 1 - Never What is the  last grade level you completed in school?: 12 Grade  Interpreter Needed?: No  Information entered by :: Reha Martinovich FNP-C  Past Medical History:  Diagnosis Date  . Anemia due to chronic kidney disease   . Bilateral inguinal hernia   . CAD (coronary artery disease) cardiologist-  dr bensimhon   PTCA of OM in 1998, [place] OM in 2000, Cath 9/07 LM ok LAD ok. LCX 95% in OM prior to previous stent RCA. nondominant normal EF normal. Cypher DES to OM 2007 (PLACED PROXIAMAL TO PREVIOUS STENT)  . Chronic kidney disease, stage III (moderate)    nephrologist-  dr detarding  . Depression   . Diabetes mellitus type 2, diet-controlled ([place])    followed by pcp,  last A1c 5.2 on 09-10-2017 in epic  . Gout, unspecified    10-29-2017  per pt stable  . HLD (hyperlipidemia)   . HTN (hypertension)   . MGUS (monoclonal gammopathy of unknown significance) previously followed by dr Beryle Beams , lov and released 08/01/2011   IgG kappa dx 2002 8% plasma cells in bone marrow; no lesions on bone X-rays;  . Nocturia   . OA (osteoarthritis)   . OSA on CPAP    per study 01-30-2004  severe osa , AHI 51.6/hr  . PAD (peripheral artery disease) ([place])    05-21-2006  left renal artery stenosis, s/p balloon angioplasty and stenting;  last duplex 02/ 2012  normal , arteries patent  . S/P coronary artery stent placement    2000--  BMS x1  to  OM;   2007-- DES x1  to OM proximal to previous stent  . Secondary hyperparathyroidism of renal origin ([place])   . Urgency of urination   . Wears glasses    Past Surgical History:  Procedure Laterality Date  . CARDIOVASCULAR STRESS TEST  2010   per dr bensimhon epic note dated 04-18-2016  normal  . [place]   PTCA to OM  . CORONARY ANGIOPLASTY WITH STENT PLACEMENT  2000   PCI and BMS x1 OM  . CORONARY ANGIOPLASTY WITH STENT PLACEMENT  12-18-2005  dr Claiborne Billings   DES x1 to proximal to previously placed stent in OM  . HERNIA REPAIR    . INGUINAL HERNIA REPAIR  Bilateral 10/30/2017   Procedure: LAPAROSCOPIC  BILATERAL INGUINAL HERNIA REPAIR  AND LEFT [place];  Surgeon: Michael Boston, MD;  Location: [place];  Service: General;  Laterality: Bilateral;  . INSERTION OF MESH Bilateral 10/30/2017   Procedure: INSERTION OF MESH;  Surgeon: Michael Boston, MD;  Location: [place];  Service: General;  Laterality: Bilateral;  . RENAL ANGIOPLASTY Left 05-21-2006   dr berry   and stenting  . TOTAL KNEE ARTHROPLASTY Left 07-22-2005   dr Shellia Carwin  [facility]  . TRANSTHORACIC ECHOCARDIOGRAM  04/18/2016   ef 54-09%, grade 1 diastolic dysfunction/  mild to moderate AR/  mild MR   Family History  Problem Relation Age of Onset  . Heart disease Mother   . Healthy Father   . Heart disease Sister   . Heart disease Sister   . Cancer Sister        type unknown  . Hypertension Sister   . Diabetes Brother   . Diabetes Brother   . Diabetes Other   . Coronary artery disease Other   . Cancer Other    Social History   Socioeconomic History  . Marital status: Widowed    Spouse name: Not on file  . Number of children: 1  . Years of education: Not on file  . Highest education level: Not on file  Occupational History  . Occupation: retired   Tobacco Use  . Smoking status: Former Smoker    Years: 54.00    Types: Cigarettes    Quit date: 09/29/2017    Years since quitting: 1.6  . Smokeless tobacco: Never Used  . Tobacco comment: 10-29-2017  per pt was smoking 1pp2d, stopped June 2019  Substance and Sexual Activity  . Alcohol use: Not Currently  . Drug use: No  . Sexual activity: Not on file  Other Topics Concern  . Not on file  Social History Narrative   Widowed 02/2012   Lives along   [place] smoking 2004   Alcohol none   Exercise works in [place] Strain:   . Difficulty of Paying Living Expenses: Not on file  Food Insecurity:   .  Worried About Charity fundraiser in the Last Year: Not on file  . Ran Out of Food in the Last Year: Not on file  Transportation Needs:   . Lack of Transportation (Medical): Not on file  . Lack of Transportation (Non-Medical): Not on file  Physical Activity:   . Days of Exercise per Week: Not on file  . Minutes of Exercise per Session: Not on file  Stress:   . Feeling of Stress : Not on file  Social Connections:   .  Frequency of Communication with Friends and Family: Not on file  . Frequency of Social Gatherings with Friends and Family: Not on file  . Attends Religious Services: Not on file  . Active Member of Clubs or Organizations: Not on file  . Attends Archivist Meetings: Not on file  . Marital Status: Not on file    Outpatient Encounter Medications as of 05/31/2019  Medication Sig  . ACCU-CHEK SOFTCLIX LANCETS lancets Use to test blood sugar daily. Dx:E11.8  . acetaminophen (TYLENOL) 325 MG tablet Take 1 tablet (325 mg total) by mouth every 8 (eight) hours as needed.  . Alcohol Swabs (B-D SINGLE USE SWABS REGULAR) PADS Use with testing of blood sugar. Dx:E11.8  . allopurinol (ZYLOPRIM) 100 MG tablet Take 1 tablet (100 mg total) by mouth daily.  [name] amLODipine (NORVASC) 5 MG tablet Take 1 tablet (5 mg total) by mouth daily.  [name] aspirin EC 81 MG tablet Take 81 mg by mouth daily.  [name] atorvastatin (LIPITOR) 80 MG tablet TAKE 1 TABLET (80 MG TOTAL) BY MOUTH DAILY.  . citalopram (CELEXA) 20 MG tablet Take 1 tablet (20 mg total) by mouth daily.  [name] docusate sodium (COLACE) 100 MG capsule Take 1 capsule (100 mg total) by mouth every 12 (twelve) hours.  [name] glucose blood (ACCU-CHEK AVIVA PLUS) test strip Use to check blood sugar daily. Dx:E11.8  . metoprolol tartrate (LOPRESSOR) 50 MG tablet TAKE 1 TABLET TWICE DAILY  . nitroGLYCERIN (NITROSTAT) 0.4 MG SL tablet Place 0.4 mg under the tongue every 5 (five) minutes as needed for chest pain. Up to 3 doses  . predniSONE (DELTASONE) 50 MG  tablet One daily with food  . ULTRA-THIN II MINI PEN NEEDLE 31G X 5 MM MISC    No facility-administered encounter medications on file as of 05/31/2019.    Activities of Daily Living In your present state of health, do you have any difficulty performing the following activities: 05/31/2019  Hearing? N  Vision? N  Difficulty concentrating or making decisions? N  Walking or climbing stairs? N  Dressing or bathing? N  Doing errands, shopping? N  Preparing Food and eating ? N  Using the Toilet? N  In the past six months, have you accidently leaked urine? N  Do you have problems with loss of bowel control? N  Managing your Medications? N  Housekeeping or managing your Housekeeping? N  Some recent data might be hidden    Patient Care Team: Gayland Curry, DO as PCP - General (Geriatric Medicine) Bensimhon, Shaune Pascal, MD as Consulting Physician (Cardiology) Penninger, Ria Comment, [place] as Physician Assistant (Nephrology) Donato Heinz, MD as Consulting Physician (Nephrology) Michael Boston, MD as Consulting Physician (General Surgery) Ralene Bathe, MD as Consulting Physician (Ophthalmology)   Assessment:   This is a routine wellness examination for [place].  Exercise Activities and Dietary recommendations Current Exercise Habits: Home exercise routine, Type of exercise: strength training/weights;walking, Time (Minutes): 20, Frequency (Times/Week): 3, Weekly Exercise (Minutes/Week): 60, Intensity: Moderate, Exercise limited by: None identified  Goals    . <enter goal here>     Starting 05/09/16, I will maintain currant lifestyle, while working on making it better.     . Exercise 3x per week (30 min per time)     Patient would like to exercise more than he does now       Fall Risk Fall Risk  05/31/2019 05/09/2019 11/08/2018 06/28/2018 05/26/2018  Falls in the past year? 0 0 0 0 0  Number falls  in past yr: 0 - 0 0 0  Injury with Fall? 0 - 0 0 0  Risk Factor Category  - - - - -    Risk for fall due to : - - - - -  Follow up - - - - -   Is the patient's home free of loose throw rugs in walkways, pet beds, electrical cords, etc?   no      Grab bars in the bathroom? yes      Handrails on the stairs?   yes      Adequate lighting?   yes  Depression Screen PHQ 2/9 Scores 05/31/2019 06/28/2018 05/26/2018 01/25/2018  PHQ - 2 Score 0 0 0 0    Cognitive Function MMSE - Mini Mental State Exam 05/26/2018 05/25/2017 05/09/2016 04/26/2015  Orientation to time 5 5 5 5   Orientation to Place 5 5 5 5   Registration 3 3 3 3   Attention/ Calculation 2 4 3 4   Recall 3 1 3 3   Language- name 2 objects 2 2 2 2   Language- repeat 1 1 1 1   Language- follow 3 step command 3 3 3 3   Language- read & follow direction 1 1 1 1   Write a sentence 1 1 1 1   Copy design 1 1 1 1   Total score 27 27 28 29      6CIT Screen 05/31/2019  What Year? 0 points  What month? 0 points  What time? 0 points  Count back from 20 0 points  Months in reverse 4 points  Repeat phrase 10 points  Total Score 14    Immunization History  Administered Date(s) Administered  . Fluad Quad(high Dose 65+) 01/06/2019  . Influenza, High Dose Seasonal PF 01/08/2017, 01/25/2018  . Influenza,inj,Quad PF,6+ Mos 12/27/2012, 03/06/2014, 04/02/2015, 11/30/2015  . Pneumococcal Conjugate-13 09/22/2014  . Pneumococcal Polysaccharide-23 12/27/2012  . Tdap 04/15/2011    Qualifies for Shingles Vaccine? Declined due to cost    Screening Tests Health Maintenance  Topic Date Due  . URINE MICROALBUMIN  06/28/2019  . OPHTHALMOLOGY EXAM  09/16/2019  . HEMOGLOBIN A1C  11/02/2019  . FOOT EXAM  01/06/2020  . TETANUS/TDAP  04/14/2021  . INFLUENZA VACCINE  Completed  . PNA vac Low Risk Adult  Completed   Cancer Screenings: Lung: Low Dose CT Chest recommended if Age 87-80 years, 30 pack-year currently smoking OR have quit w/in 15years. Patient does not qualify. Colorectal: Age Out   Additional Screenings: Hepatitis C  Screening:Low risk    Plan:  - Declined Shingrix   I have personally reviewed and noted the following in the patient's chart:   . Medical and social history . Use of alcohol, tobacco or illicit drugs  . Current medications and supplements . Functional ability and status . Nutritional status . Physical activity . Advanced directives . List of other physicians . Hospitalizations, surgeries, and ER visits in previous 12 months . Vitals . Screenings to include cognitive, depression, and falls . Referrals and appointments  In addition, I have reviewed and discussed with patient certain preventive protocols, quality metrics, and best practice recommendations. A written personalized care plan for preventive services as well as general preventive health recommendations were provided to patient.   Sandrea Hughs, NP  05/31/2019

## 2019-05-31 NOTE — Patient Instructions (Signed)
[name] , Thank you for taking time to come for your Medicare Wellness Visit. I appreciate your ongoing commitment to your health goals. Please review the following plan we discussed and let me know if I can assist you in the future.   Screening recommendations/referrals: Colonoscopy: Aged out  Recommended yearly ophthalmology/optometry visit for glaucoma screening and checkup Recommended yearly dental visit for hygiene and checkup  Vaccinations: Influenza vaccine: Up to date  Pneumococcal vaccine : Up to date Tdap vaccine : Up to date due 04/14/2021  Shingles vaccine: Declined due to Cost     Advanced directives: Yes   Conditions/risks identified: Advance age male > 93 yrs,Hypertension,Male Gender,Dyslipidemia,Family Hx of premature Cardiovascular disease,Hx of smoking   Next appointment: 1 year   Preventive Care 19 Years and Older, Male Preventive care refers to lifestyle choices and visits with your health care provider that can promote health and wellness. What does preventive care include?  A yearly physical exam. This is also called an annual well check.  Dental exams once or twice a year.  Routine eye exams. Ask your health care provider how often you should have your eyes checked.  Personal lifestyle choices, including:  Daily care of your teeth and gums.  Regular physical activity.  Eating a healthy diet.  Avoiding tobacco and drug use.  Limiting alcohol use.  Practicing safe sex.  Taking low doses of aspirin every day.  Taking vitamin and mineral supplements as recommended by your health care provider. What happens during an annual well check? The services and screenings done by your health care provider during your annual well check will depend on your age, overall health, lifestyle risk factors, and family history of disease. Counseling  Your health care provider may ask you questions about your:  Alcohol use.  Tobacco use.  Drug use.  Emotional  well-being.  Home and relationship well-being.  Sexual activity.  Eating habits.  History of falls.  Memory and ability to understand (cognition).  Work and work Statistician. Screening  You may have the following tests or measurements:  Height, weight, and BMI.  Blood pressure.  Lipid and cholesterol levels. These may be checked every 5 years, or more frequently if you are over 71 years old.  Skin check.  Lung cancer screening. You may have this screening every year starting at age 5 if you have a 30-pack-year history of smoking and currently smoke or have quit within the past 15 years.  Fecal occult blood test (FOBT) of the stool. You may have this test every year starting at age 39.  Flexible sigmoidoscopy or colonoscopy. You may have a sigmoidoscopy every 5 years or a colonoscopy every 10 years starting at age 75.  Prostate cancer screening. Recommendations will vary depending on your family history and other risks.  Hepatitis C blood test.  Hepatitis B blood test.  Sexually transmitted disease (STD) testing.  Diabetes screening. This is done by checking your blood sugar (glucose) after you have not eaten for a while (fasting). You may have this done every 1-3 years.  Abdominal aortic aneurysm (AAA) screening. You may need this if you are a current or former smoker.  Osteoporosis. You may be screened starting at age 27 if you are at high risk. Talk with your health care provider about your test results, treatment options, and if necessary, the need for more tests. Vaccines  Your health care provider may recommend certain vaccines, such as:  Influenza vaccine. This is recommended every year.  Tetanus, diphtheria, and acellular pertussis (Tdap, Td) vaccine. You may need a Td booster every 10 years.  Zoster vaccine. You may need this after age 52.  Pneumococcal 13-valent conjugate (PCV13) vaccine. One dose is recommended after age 26.  Pneumococcal  polysaccharide (PPSV23) vaccine. One dose is recommended after age 84. Talk to your health care provider about which screenings and vaccines you need and how often you need them. This information is not intended to replace advice given to you by your health care provider. Make sure you discuss any questions you have with your health care provider. Document Released: 04/27/2015 Document Revised: 12/19/2015 Document Reviewed: 01/30/2015 Elsevier Interactive Patient Education  2017 [place] Prevention in the Home Falls can cause injuries. They can happen to people of all ages. There are many things you can do to make your home safe and to help prevent falls. What can I do on the outside of my home?  Regularly fix the edges of walkways and driveways and fix any cracks.  Remove anything that might make you trip as you walk through a door, such as a raised step or threshold.  Trim any bushes or trees on the path to your home.  Use bright outdoor lighting.  Clear any walking paths of anything that might make someone trip, such as rocks or tools.  Regularly check to see if handrails are loose or broken. Make sure that both sides of any steps have handrails.  Any raised decks and porches should have guardrails on the edges.  Have any leaves, snow, or ice cleared regularly.  Use sand or salt on walking paths during winter.  Clean up any spills in your garage right away. This includes oil or grease spills. What can I do in the bathroom?  Use night lights.  Install grab bars by the toilet and in the tub and shower. Do not use towel bars as grab bars.  Use non-skid mats or decals in the tub or shower.  If you need to sit down in the shower, use a plastic, non-slip stool.  Keep the floor dry. Clean up any water that spills on the floor as soon as it happens.  Remove soap buildup in the tub or shower regularly.  Attach bath mats securely with double-sided non-slip rug  tape.  Do not have throw rugs and other things on the floor that can make you trip. What can I do in the bedroom?  Use night lights.  Make sure that you have a light by your bed that is easy to reach.  Do not use any sheets or blankets that are too big for your bed. They should not hang down onto the floor.  Have a firm chair that has side arms. You can use this for support while you get dressed.  Do not have throw rugs and other things on the floor that can make you trip. What can I do in the kitchen?  Clean up any spills right away.  Avoid walking on wet floors.  Keep items that you use a lot in easy-to-reach places.  If you need to reach something above you, use a strong step stool that has a grab bar.  Keep electrical cords out of the way.  Do not use floor polish or wax that makes floors slippery. If you must use wax, use non-skid floor wax.  Do not have throw rugs and other things on the floor that can make you trip. What can I do  with my stairs?  Do not leave any items on the stairs.  Make sure that there are handrails on both sides of the stairs and use them. Fix handrails that are broken or loose. Make sure that handrails are as long as the stairways.  Check any carpeting to make sure that it is firmly attached to the stairs. Fix any carpet that is loose or worn.  Avoid having throw rugs at the top or bottom of the stairs. If you do have throw rugs, attach them to the floor with carpet tape.  Make sure that you have a light switch at the top of the stairs and the bottom of the stairs. If you do not have them, ask someone to add them for you. What else can I do to help prevent falls?  Wear shoes that:  Do not have high heels.  Have rubber bottoms.  Are comfortable and fit you well.  Are closed at the toe. Do not wear sandals.  If you use a stepladder:  Make sure that it is fully opened. Do not climb a closed stepladder.  Make sure that both sides of the  stepladder are locked into place.  Ask someone to hold it for you, if possible.  Clearly mark and make sure that you can see:  Any grab bars or handrails.  First and last steps.  Where the edge of each step is.  Use tools that help you move around (mobility aids) if they are needed. These include:  Canes.  Walkers.  Scooters.  Crutches.  Turn on the lights when you go into a dark area. Replace any light bulbs as soon as they burn out.  Set up your furniture so you have a clear path. Avoid moving your furniture around.  If any of your floors are uneven, fix them.  If there are any pets around you, be aware of where they are.  Review your medicines with your doctor. Some medicines can make you feel dizzy. This can increase your chance of falling. Ask your doctor what other things that you can do to help prevent falls. This information is not intended to replace advice given to you by your health care provider. Make sure you discuss any questions you have with your health care provider. Document Released: 01/25/2009 Document Revised: 09/06/2015 Document Reviewed: 05/05/2014 Elsevier Interactive Patient Education  2017 [employer].

## 2019-05-31 NOTE — Progress Notes (Signed)
Patient ID: Kevin Schwartz, male   DOB: [birth year], 86 y.o.   MRN: [id number] This service is provided via telemedicine  No vital signs collected/recorded due to the encounter was a telemedicine visit.   Location of patient (ex: home, work):  HOME  Patient consents to a telephone visit:  YES  Location of the provider (ex: office, home):  OFFICE  Name of any referring provider:  TIFFANY REED, DO  Names of all persons participating in the telemedicine service and their role in the encounter:  PATIENT, [name], [place], [place], NP  Time spent on call:  6:40

## 2019-09-02 ENCOUNTER — Other Ambulatory Visit: Payer: Medicare Other

## 2019-09-02 ENCOUNTER — Other Ambulatory Visit: Payer: Self-pay

## 2019-09-02 DIAGNOSIS — M10431 Other secondary gout, right wrist: Secondary | ICD-10-CM

## 2019-09-02 DIAGNOSIS — D472 Monoclonal gammopathy: Secondary | ICD-10-CM | POA: Diagnosis not present

## 2019-09-02 DIAGNOSIS — R634 Abnormal weight loss: Secondary | ICD-10-CM

## 2019-09-02 DIAGNOSIS — E1122 Type 2 diabetes mellitus with diabetic chronic kidney disease: Secondary | ICD-10-CM | POA: Diagnosis not present

## 2019-09-02 DIAGNOSIS — N183 Chronic kidney disease, stage 3 unspecified: Secondary | ICD-10-CM

## 2019-09-03 LAB — URIC ACID: Uric Acid, Serum: 7.3 mg/dL (ref 4.0–8.0)

## 2019-09-03 LAB — COMPLETE METABOLIC PANEL WITH GFR
AG Ratio: 1.3 (calc) (ref 1.0–2.5)
ALT: 11 U/L (ref 9–46)
AST: 18 U/L (ref 10–35)
Albumin: 4 g/dL (ref 3.6–5.1)
Alkaline phosphatase (APISO): 80 U/L (ref 35–144)
BUN/Creatinine Ratio: 11 (calc) (ref 6–22)
BUN: 16 mg/dL (ref 7–25)
CO2: 23 mmol/L (ref 20–32)
Calcium: 9.1 mg/dL (ref 8.6–10.3)
Chloride: 108 mmol/L (ref 98–110)
Creat: 1.42 mg/dL — ABNORMAL HIGH (ref 0.70–1.11)
GFR, Est African American: 51 mL/min/{1.73_m2} — ABNORMAL LOW (ref >=60)
GFR, Est Non African American: 44 mL/min/{1.73_m2} — ABNORMAL LOW (ref >=60)
Globulin: 3.1 g/dL (calc) (ref 1.9–3.7)
Glucose, Bld: 119 mg/dL — ABNORMAL HIGH (ref 65–99)
Potassium: 4.1 mmol/L (ref 3.5–5.3)
Sodium: 140 mmol/L (ref 135–146)
Total Bilirubin: 0.5 mg/dL (ref 0.2–1.2)
Total Protein: 7.1 g/dL (ref 6.1–8.1)

## 2019-09-03 LAB — CBC WITH DIFFERENTIAL/PLATELET
Absolute Monocytes: 502 cells/uL (ref 200–950)
Basophils Absolute: 20 cells/uL (ref 0–200)
Basophils Relative: 0.3 %
Eosinophils Absolute: 198 cells/uL (ref 15–500)
Eosinophils Relative: 3 %
HCT: 36.2 % — ABNORMAL LOW (ref 38.5–50.0)
Hemoglobin: 12 g/dL — ABNORMAL LOW (ref 13.2–17.1)
Lymphs Abs: 1049 cells/uL (ref 850–3900)
MCH: 30.8 pg (ref 27.0–33.0)
MCHC: 33.1 g/dL (ref 32.0–36.0)
MCV: 93.1 fL (ref 80.0–100.0)
MPV: 10.6 fL (ref 7.5–12.5)
Monocytes Relative: 7.6 %
Neutro Abs: 4831 cells/uL (ref 1500–7800)
Neutrophils Relative %: 73.2 %
Platelets: 373 10*3/uL (ref 140–400)
RBC: 3.89 10*6/uL — ABNORMAL LOW (ref 4.20–5.80)
RDW: 14.2 % (ref 11.0–15.0)
Total Lymphocyte: 15.9 %
WBC: 6.6 10*3/uL (ref 3.8–10.8)

## 2019-09-03 LAB — TSH: TSH: 2.48 mIU/L (ref 0.40–4.50)

## 2019-09-03 LAB — HEMOGLOBIN A1C
Hgb A1c MFr Bld: 5.3 % of total Hgb (ref <5.7)
Mean Plasma Glucose: 105 (calc)
eAG (mmol/L): 5.8 (calc)

## 2019-09-05 ENCOUNTER — Ambulatory Visit (INDEPENDENT_AMBULATORY_CARE_PROVIDER_SITE_OTHER): Payer: Medicare Other | Admitting: Internal Medicine

## 2019-09-05 ENCOUNTER — Encounter: Payer: Self-pay | Admitting: Internal Medicine

## 2019-09-05 ENCOUNTER — Other Ambulatory Visit: Payer: Self-pay

## 2019-09-05 VITALS — BP 136/72 | HR 73 | Temp 97.1°F | Ht 67.0 in | Wt 174.0 lb

## 2019-09-05 DIAGNOSIS — E1122 Type 2 diabetes mellitus with diabetic chronic kidney disease: Secondary | ICD-10-CM | POA: Diagnosis not present

## 2019-09-05 DIAGNOSIS — I509 Heart failure, unspecified: Secondary | ICD-10-CM | POA: Diagnosis not present

## 2019-09-05 DIAGNOSIS — I10 Essential (primary) hypertension: Secondary | ICD-10-CM | POA: Diagnosis not present

## 2019-09-05 DIAGNOSIS — N183 Chronic kidney disease, stage 3 unspecified: Secondary | ICD-10-CM

## 2019-09-05 DIAGNOSIS — D472 Monoclonal gammopathy: Secondary | ICD-10-CM

## 2019-09-05 DIAGNOSIS — M25551 Pain in right hip: Secondary | ICD-10-CM

## 2019-09-05 DIAGNOSIS — E782 Mixed hyperlipidemia: Secondary | ICD-10-CM

## 2019-09-05 DIAGNOSIS — E1169 Type 2 diabetes mellitus with other specified complication: Secondary | ICD-10-CM

## 2019-09-05 MED ORDER — METOPROLOL TARTRATE 50 MG PO TABS: 50.0000 mg | ORAL_TABLET | Freq: Two times a day (BID) | ORAL | 1 refills | Status: DC

## 2019-09-05 MED ORDER — ACETAMINOPHEN 325 MG PO TABS: 325.0000 mg | ORAL_TABLET | Freq: Three times a day (TID) | ORAL | 0 refills | Status: DC | PRN

## 2019-09-05 MED ORDER — AMLODIPINE BESYLATE 5 MG PO TABS: 5.0000 mg | ORAL_TABLET | Freq: Every day | ORAL | 3 refills | Status: DC

## 2019-09-05 MED ORDER — PREDNISONE 50 MG PO TABS: ORAL_TABLET | ORAL | 1 refills | Status: DC

## 2019-09-05 MED ORDER — DOCUSATE SODIUM 100 MG PO CAPS: 100.0000 mg | ORAL_CAPSULE | Freq: Two times a day (BID) | ORAL | 0 refills | Status: DC

## 2019-09-05 MED ORDER — ATORVASTATIN CALCIUM 80 MG PO TABS: 80.0000 mg | ORAL_TABLET | Freq: Every day | ORAL | 1 refills | Status: DC

## 2019-09-05 MED ORDER — ACCU-CHEK AVIVA PLUS VI STRP: ORAL_STRIP | 3 refills | Status: DC

## 2019-09-05 MED ORDER — NITROGLYCERIN 0.4 MG SL SUBL: 0.4000 mg | SUBLINGUAL_TABLET | SUBLINGUAL | 2 refills | Status: DC | PRN

## 2019-09-05 MED ORDER — CITALOPRAM HYDROBROMIDE 20 MG PO TABS: 20.0000 mg | ORAL_TABLET | Freq: Every day | ORAL | 3 refills | Status: DC

## 2019-09-05 MED ORDER — ALLOPURINOL 100 MG PO TABS: 100.0000 mg | ORAL_TABLET | Freq: Every day | ORAL | 3 refills | Status: DC

## 2019-09-05 NOTE — Progress Notes (Signed)
Uric acid has trended up above goal of less than 6 for gout patients.  Has he been taking his allopurinol?  I'd like him to bring all of his pills along to his appt.

## 2019-09-05 NOTE — Progress Notes (Signed)
Location:  [facility] clinic Provider:  Kathlene Yano L. Mariea Clonts, D.O., C.M.D.  Code Status: DNR Goals of Care:  Advanced Directives 09/05/2019  Does Patient Have a Medical Advance Directive? No  Type of Advance Directive -  Does patient want to make changes to medical advance directive? -  Copy of [place] in Chart? -  Would patient like information on creating a medical advance directive? No - Patient declined   Chief Complaint  Patient presents with  . Medical Management of Chronic Issues    4 month follow     HPI: Patient is a 86 y.o. male seen today for medical management of chronic diseases.    Feet are swollen about two weeks.  He's been w/o meds for a long time.  He changed insurance companies and had to send in meds to mail order.    Uric acid was high w/o allopurinol.  Remaining basic labs wnl.  BP is good.  Recheck was 144/82 by me on right arm.  Weight up 10 lbs to 174 per pt.    Past Medical History:  Diagnosis Date  . Anemia due to chronic kidney disease   . Bilateral inguinal hernia   . CAD (coronary artery disease) cardiologist-  dr bensimhon   PTCA of OM in 1998, [place] OM in 2000, Cath 9/07 LM ok LAD ok. LCX 95% in OM prior to previous stent RCA. nondominant normal EF normal. [place] DES to OM 2007 (PLACED PROXIAMAL TO PREVIOUS STENT)  . Chronic kidney disease, stage III (moderate)    nephrologist-  dr detarding  . Depression   . Diabetes mellitus type 2, diet-controlled ([place])    followed by pcp,  last A1c 5.2 on 09-10-2017 in epic  . Gout, unspecified    10-29-2017  per pt stable  . HLD (hyperlipidemia)   . HTN (hypertension)   . MGUS (monoclonal gammopathy of unknown significance) previously followed by dr Beryle Beams , lov and released 08/01/2011   IgG kappa dx 2002 8% plasma cells in bone marrow; no lesions on bone X-rays;  . Nocturia   . OA (osteoarthritis)   . OSA on CPAP    per study 01-30-2004  severe osa , AHI 51.6/hr  . PAD (peripheral  artery disease) ([place])    05-21-2006  left renal artery stenosis, s/p balloon angioplasty and stenting;  last duplex 02/ 2012  normal , arteries patent  . S/P coronary artery stent placement    2000--  BMS x1  to OM;   2007-- DES x1  to OM proximal to previous stent  . Secondary hyperparathyroidism of renal origin ([place])   . Urgency of urination   . Wears glasses     Past Surgical History:  Procedure Laterality Date  . CARDIOVASCULAR STRESS TEST  2010   per dr bensimhon epic note dated 04-18-2016  normal  . [place]   PTCA to OM  . CORONARY ANGIOPLASTY WITH STENT PLACEMENT  2000   PCI and BMS x1 OM  . CORONARY ANGIOPLASTY WITH STENT PLACEMENT  12-18-2005  dr Claiborne Billings   DES x1 to proximal to previously placed stent in OM  . HERNIA REPAIR    . INGUINAL HERNIA REPAIR Bilateral 10/30/2017   Procedure: LAPAROSCOPIC  BILATERAL INGUINAL HERNIA REPAIR  AND LEFT [place];  Surgeon: Michael Boston, MD;  Location: [place];  Service: General;  Laterality: Bilateral;  . INSERTION OF MESH Bilateral 10/30/2017   Procedure: INSERTION OF MESH;  Surgeon: Michael Boston, MD;  Location: [facility];  Service: General;  Laterality: Bilateral;  . RENAL ANGIOPLASTY Left 05-21-2006   dr berry   and stenting  . TOTAL KNEE ARTHROPLASTY Left 07-22-2005   dr Shellia Carwin  [facility]  . TRANSTHORACIC ECHOCARDIOGRAM  04/18/2016   ef 18-84%, grade 1 diastolic dysfunction/  mild to moderate AR/  mild MR    Allergies  Allergen Reactions  . Lisinopril Swelling    angioedema  . Losartan Potassium Swelling    Angioedema   . Crestor [Rosuvastatin] Swelling    angioedema    Outpatient Encounter Medications as of 09/05/2019  Medication Sig  . ACCU-CHEK SOFTCLIX LANCETS lancets Use to test blood sugar daily. Dx:E11.8  . acetaminophen (TYLENOL) 325 MG tablet Take 1 tablet (325 mg total) by mouth every 8 (eight) hours as needed.  . Alcohol Swabs (B-D SINGLE USE SWABS  REGULAR) PADS Use with testing of blood sugar. Dx:E11.8  . amLODipine (NORVASC) 5 MG tablet Take 1 tablet (5 mg total) by mouth daily.  [name] aspirin EC 81 MG tablet Take 81 mg by mouth daily.  [name] atorvastatin (LIPITOR) 80 MG tablet Take 1 tablet (80 mg total) by mouth daily.  . citalopram (CELEXA) 20 MG tablet Take 1 tablet (20 mg total) by mouth daily.  [name] docusate sodium (COLACE) 100 MG capsule Take 1 capsule (100 mg total) by mouth every 12 (twelve) hours.  [name] glucose blood (ACCU-CHEK AVIVA PLUS) test strip Use to check blood sugar daily. Dx:E11.8  . metoprolol tartrate (LOPRESSOR) 50 MG tablet Take 1 tablet (50 mg total) by mouth 2 (two) times daily.  . nitroGLYCERIN (NITROSTAT) 0.4 MG SL tablet Place 1 tablet (0.4 mg total) under the tongue every 5 (five) minutes as needed for chest pain. Up to 3 doses  . predniSONE (DELTASONE) 50 MG tablet One daily with food  . ULTRA-THIN II MINI PEN NEEDLE 31G X 5 MM MISC   . [DISCONTINUED] acetaminophen (TYLENOL) 325 MG tablet Take 1 tablet (325 mg total) by mouth every 8 (eight) hours as needed.  . [DISCONTINUED] allopurinol (ZYLOPRIM) 100 MG tablet Take 1 tablet (100 mg total) by mouth daily.  . [DISCONTINUED] amLODipine (NORVASC) 5 MG tablet Take 1 tablet (5 mg total) by mouth daily.  . [DISCONTINUED] atorvastatin (LIPITOR) 80 MG tablet TAKE 1 TABLET (80 MG TOTAL) BY MOUTH DAILY.  . [DISCONTINUED] citalopram (CELEXA) 20 MG tablet Take 1 tablet (20 mg total) by mouth daily.  . [DISCONTINUED] docusate sodium (COLACE) 100 MG capsule Take 1 capsule (100 mg total) by mouth every 12 (twelve) hours.  . [DISCONTINUED] glucose blood (ACCU-CHEK AVIVA PLUS) test strip Use to check blood sugar daily. Dx:E11.8  . [DISCONTINUED] metoprolol tartrate (LOPRESSOR) 50 MG tablet TAKE 1 TABLET TWICE DAILY  . [DISCONTINUED] nitroGLYCERIN (NITROSTAT) 0.4 MG SL tablet Place 0.4 mg under the tongue every 5 (five) minutes as needed for chest pain. Up to 3 doses  . [DISCONTINUED]  predniSONE (DELTASONE) 50 MG tablet One daily with food  . allopurinol (ZYLOPRIM) 100 MG tablet Take 1 tablet (100 mg total) by mouth daily.   No facility-administered encounter medications on file as of 09/05/2019.    Review of Systems:  Review of Systems  Constitutional: Negative for chills, fever and malaise/fatigue.       Wt gain  HENT:       Also seems to be hoarse  Respiratory: Negative for cough and shortness of breath.   Cardiovascular: Positive for leg swelling. Negative  for chest pain, palpitations, orthopnea and PND.       Denies dyspnea on exertion but admits he does not really exert himself  Gastrointestinal: Negative for abdominal pain.  Genitourinary: Negative for dysuria.       Incontinence (urine odor)  Musculoskeletal: Positive for joint pain. Negative for falls.       Hip  Neurological: Negative for dizziness and loss of consciousness.  Psychiatric/Behavioral: Positive for memory loss. Negative for depression. The patient is not nervous/anxious and does not have insomnia.     Health Maintenance  Topic Date Due  . URINE MICROALBUMIN  06/28/2019  . OPHTHALMOLOGY EXAM  09/16/2019  . INFLUENZA VACCINE  11/13/2019  . FOOT EXAM  01/06/2020  . HEMOGLOBIN A1C  03/04/2020  . TETANUS/TDAP  04/14/2021  . COVID-19 Vaccine  Completed  . PNA vac Low Risk Adult  Completed    Physical Exam: Vitals:   09/05/19 1151  BP: 136/72  Pulse: 73  Temp: (!) 97.1 F (36.2 C)  TempSrc: Temporal  SpO2: 99%  Weight: 163 lb (73.9 kg)  Height: 5\' 7"  (1.702 m)   Body mass index is 25.53 kg/m. Physical Exam Vitals reviewed.  Constitutional:      General: He is not in acute distress.    Appearance: He is not toxic-appearing.  HENT:     Head: Normocephalic and atraumatic.  Eyes:     Extraocular Movements: Extraocular movements intact.     Pupils: Pupils are equal, round, and reactive to light.     Comments: glasses  Cardiovascular:     Rate and Rhythm: Normal rate and  regular rhythm.     Heart sounds: Murmur present.  Pulmonary:     Effort: Pulmonary effort is normal.     Breath sounds: Rales present.  Abdominal:     General: Bowel sounds are normal. There is no distension.     Palpations: Abdomen is soft.     Tenderness: There is no abdominal tenderness. There is no guarding or rebound.  Musculoskeletal:        General: Normal range of motion.     Right lower leg: Edema present.     Left lower leg: Edema present.     Comments: 2+ edema bilateral feet and lower legs  Skin:    General: Skin is warm and dry.  Neurological:     Mental Status: He is alert.     Gait: Gait abnormal.     Comments: Walks with cane, limp  Psychiatric:        Mood and Affect: Mood normal.     Labs reviewed: Basic Metabolic Panel: Recent Labs    11/16/18 1245 05/05/19 1005 09/02/19 0954  NA 140 140 140  K 4.1 4.1 4.1  CL 107 108 108  CO2 25 23 23   GLUCOSE 96 87 119*  BUN 18 22 16   CREATININE 1.30* 1.48* 1.42*  CALCIUM 9.7 9.1 9.1  TSH  --   --  2.48   Liver Function Tests: Recent Labs    11/16/18 1245 05/05/19 1005 09/02/19 0954  AST 18 17 18   ALT 13 13 11   ALKPHOS 70  --   --   BILITOT 0.4 0.4 0.5  PROT 7.8 6.7 7.1  ALBUMIN 4.2  --   --    No results for input(s): LIPASE, AMYLASE in the last 8760 hours. No results for input(s): AMMONIA in the last 8760 hours. CBC: Recent Labs    11/16/18 1245 05/05/19 1005 09/02/19 0954  WBC 7.9 8.5 6.6  NEUTROABS 6.4 6,426 4,831  HGB 12.0* 10.8* 12.0*  HCT 36.6* 33.1* 36.2*  MCV 93.1 92.2 93.1  PLT 343 384 373   Lipid Panel: Recent Labs    05/05/19 1005  CHOL 146  HDL 50  LDLCALC 80  TRIG 77  CHOLHDL 2.9   Lab Results  Component Value Date   HGBA1C 5.3 09/02/2019    Assessment/Plan 1. Acute congestive heart failure, unspecified heart failure type ([place]) -he's not been taking any of his meds due to insurance change and different pharmacy ([facility])--says he was told by the  insurance that he had to wait until he saw me to get his Rxs (of course he could have called and we would have sent them) -now he has 2+ edema in both legs/feet, some rales at his bases -oddly, he's not on diuretics at baseline and bp not that high w/o his meds, not in irregular rhythm, does have murmur -he is to return if after taking meds as directed, edema persists--this was reiterated about 5 times today and written twice on his AVS  2. Controlled type 2 diabetes mellitus with stage 3 chronic kidney disease, without long-term current use of insulin (HCC) -hba1c well controlled, resume normal meds  3. Mixed hyperlipidemia due to type 2 diabetes mellitus ([place]) -well controlled, resume normal meds  4. MGUS (monoclonal gammopathy of unknown significance) -labs with heme/onc did not show any progression to myeloma and lesion on hip determined to be benign no malignant -cont to monitor  5. Essential hypertension, benign -bp slightly elevated today but better than expected considering he's been w/o meds for weeks and did not let us know -resume all meds and see where bp runs then  6. Right hip pain -denies any pain, continues to walk with limp and cane for balance--moves slower gradually at 86  Labs/tests ordered:  Will check labs same day next time; RETURN PRN IF SWELLING DOES NOT IMPROVE WHEN MEDS RESTARTED! Next appt:  01/05/2020  Hera Celaya L. Shamiya Demeritt, D.O. [facility] [address], [place] [address] Cell Phone (Mon-Fri 8am-5pm):  [phone] On Call:  [phone] & follow prompts after 5pm & weekends Office Phone:  [phone] Office Fax:  [phone]

## 2019-09-05 NOTE — Patient Instructions (Signed)
Come back if your legs remain swollen despite resuming your blood pressure pills.

## 2019-09-21 ENCOUNTER — Telehealth: Payer: Self-pay

## 2019-09-21 NOTE — Telephone Encounter (Signed)
Incoming call received from patient to confirm that we sent rx's to [facility] on 09/05/2019 for he has yet to receive them.  I viewed rx's sent and class was normal (electronic) and confirmation of receipt noted.  Patient was provided the number to [facility] to further follow-up.

## 2019-09-30 ENCOUNTER — Encounter (HOSPITAL_COMMUNITY): Payer: Self-pay

## 2019-09-30 ENCOUNTER — Emergency Department (HOSPITAL_COMMUNITY)
Admission: EM | Admit: 2019-09-30 | Discharge: 2019-10-01 | Disposition: A | Discharge status: Home / Self Care | Payer: Medicare Other | Attending: Emergency Medicine | Admitting: Emergency Medicine

## 2019-09-30 DIAGNOSIS — E1122 Type 2 diabetes mellitus with diabetic chronic kidney disease: Secondary | ICD-10-CM | POA: Diagnosis not present

## 2019-09-30 DIAGNOSIS — Z7982 Long term (current) use of aspirin: Secondary | ICD-10-CM | POA: Insufficient documentation

## 2019-09-30 DIAGNOSIS — R040 Epistaxis: Secondary | ICD-10-CM | POA: Insufficient documentation

## 2019-09-30 DIAGNOSIS — I129 Hypertensive chronic kidney disease with stage 1 through stage 4 chronic kidney disease, or unspecified chronic kidney disease: Secondary | ICD-10-CM | POA: Insufficient documentation

## 2019-09-30 DIAGNOSIS — I2510 Atherosclerotic heart disease of native coronary artery without angina pectoris: Secondary | ICD-10-CM | POA: Insufficient documentation

## 2019-09-30 DIAGNOSIS — Z79899 Other long term (current) drug therapy: Secondary | ICD-10-CM | POA: Insufficient documentation

## 2019-09-30 DIAGNOSIS — N183 Chronic kidney disease, stage 3 unspecified: Secondary | ICD-10-CM | POA: Diagnosis not present

## 2019-09-30 DIAGNOSIS — Z7984 Long term (current) use of oral hypoglycemic drugs: Secondary | ICD-10-CM | POA: Insufficient documentation

## 2019-09-30 NOTE — ED Triage Notes (Signed)
Pt arrives POV for eval of episode of epistaxis this AM lasting about 35-40 minutes which resolved w/ pressure. Also concerned about high blood pressure today. No bleeding in triage

## 2019-10-01 NOTE — ED Notes (Signed)
Discharge instructions discussed with pt. Pt verbalized understanding. Pt stable and ambulatory. No signature pad available. 

## 2019-10-01 NOTE — ED Provider Notes (Signed)
[facility] Provider Note   CSN: [id number] Arrival date & time: 09/30/19  1814     History Chief Complaint  Patient presents with  . Epistaxis    Kevin Schwartz is a 86 y.o. male.  The history is provided by the patient.  Epistaxis Location:  R nare Severity:  Mild Duration:  35 minutes Timing:  Constant Progression:  Resolved Chronicity:  New Context: not anticoagulants   Relieved by:  Applying pressure Worsened by:  Nothing Associated symptoms: no fever   Patient reports he had about a 35-minute episode of epistaxis.  No trauma.  It is since resolved.  Also reports elevated blood pressure, but no other acute complaints     Past Medical History:  Diagnosis Date  . Anemia due to chronic kidney disease   . Bilateral inguinal hernia   . CAD (coronary artery disease) cardiologist-  dr bensimhon   PTCA of OM in 1998, [place] OM in 2000, Cath 9/07 LM ok LAD ok. LCX 95% in OM prior to previous stent RCA. nondominant normal EF normal. [place] DES to OM 2007 (PLACED PROXIAMAL TO PREVIOUS STENT)  . Chronic kidney disease, stage III (moderate)    nephrologist-  dr detarding  . Depression   . Diabetes mellitus type 2, diet-controlled ([place])    followed by pcp,  last A1c 5.2 on 09-10-2017 in epic  . Gout, unspecified    10-29-2017  per pt stable  . HLD (hyperlipidemia)   . HTN (hypertension)   . MGUS (monoclonal gammopathy of unknown significance) previously followed by dr Beryle Beams , lov and released 08/01/2011   IgG kappa dx 2002 8% plasma cells in bone marrow; no lesions on bone X-rays;  . Nocturia   . OA (osteoarthritis)   . OSA on CPAP    per study 01-30-2004  severe osa , AHI 51.6/hr  . PAD (peripheral artery disease) ([place])    05-21-2006  left renal artery stenosis, s/p balloon angioplasty and stenting;  last duplex 02/ 2012  normal , arteries patent  . S/P coronary artery stent placement    2000--  BMS x1  to OM;   2007--  DES x1  to OM proximal to previous stent  . Secondary hyperparathyroidism of renal origin ([place])   . Urgency of urination   . Wears glasses     Patient Active Problem List   Diagnosis Date Noted  . Bilateral inguinal hernia s/p lap hernia repair w mesh 10/30/2017 10/30/2017  . Left femoral hernia s/p lap hernia repair w mesh 10/30/2017 10/30/2017  . Loss of weight 01/21/2016  . Heme positive stool 01/21/2016  . Heart murmur 08/18/2013  . Chronic kidney disease, stage III (moderate) 07/14/2013  . Hyperlipidemia LDL goal < 100 07/14/2013  . Essential hypertension, benign 07/14/2013  . Type II or unspecified type diabetes mellitus with renal manifestations, not stated as uncontrolled(250.40) 07/14/2013  . Insomnia 07/14/2013  . Angioedema of lips 12/26/2012  . MGUS (monoclonal gammopathy of unknown significance) 08/01/2011  . Gout of right wrist 08/01/2011  . S/P coronary artery stent placement 08/01/2011  . DM type 2 causing CKD stage 2 ([place]) 08/01/2011  . Bradycardia 10/15/2010  . Mixed hyperlipidemia due to type 2 diabetes mellitus ([place]) 05/01/2010  . HYPERTENSION, UNSPECIFIED 05/01/2010  . CAD, NATIVE VESSEL 05/01/2010  . PVD 05/01/2010    Past Surgical History:  Procedure Laterality Date  . CARDIOVASCULAR STRESS TEST  2010   per dr bensimhon epic note dated 04-18-2016  normal  . CORONARY ANGIOPLASTY  1998   PTCA to OM  . CORONARY ANGIOPLASTY WITH STENT PLACEMENT  2000   PCI and BMS x1 OM  . CORONARY ANGIOPLASTY WITH STENT PLACEMENT  12-18-2005  dr Claiborne Billings   DES x1 to proximal to previously placed stent in OM  . HERNIA REPAIR    . INGUINAL HERNIA REPAIR Bilateral 10/30/2017   Procedure: LAPAROSCOPIC  BILATERAL INGUINAL HERNIA REPAIR  AND LEFT [place];  Surgeon: Michael Boston, MD;  Location: [facility];  Service: General;  Laterality: Bilateral;  . INSERTION OF MESH Bilateral 10/30/2017   Procedure: INSERTION OF MESH;  Surgeon: Michael Boston, MD;   Location: [facility];  Service: General;  Laterality: Bilateral;  . RENAL ANGIOPLASTY Left 05-21-2006   dr berry   and stenting  . TOTAL KNEE ARTHROPLASTY Left 07-22-2005   dr Shellia Carwin  [facility]  . TRANSTHORACIC ECHOCARDIOGRAM  04/18/2016   ef 42-70%, grade 1 diastolic dysfunction/  mild to moderate AR/  mild MR       Family History  Problem Relation Age of Onset  . Heart disease Mother   . Healthy Father   . Heart disease Sister   . Heart disease Sister   . Cancer Sister        type unknown  . Hypertension Sister   . Diabetes Brother   . Diabetes Brother   . Diabetes Other   . Coronary artery disease Other   . Cancer Other     Social History   Tobacco Use  . Smoking status: Former Smoker    Years: 54.00    Types: Cigarettes    Quit date: 09/29/2017    Years since quitting: 2.0  . Smokeless tobacco: Never Used  . Tobacco comment: 10-29-2017  per pt was smoking 1pp2d, stopped June 2019  Vaping Use  . Vaping Use: Never used  Substance Use Topics  . Alcohol use: Not Currently  . Drug use: No    Home Medications Prior to Admission medications   Medication Sig Start Date End Date Taking? Authorizing Provider  ACCU-CHEK SOFTCLIX LANCETS lancets Use to test blood sugar daily. Dx:E11.8 03/25/17   Reed, Tiffany L, DO  acetaminophen (TYLENOL) 325 MG tablet Take 1 tablet (325 mg total) by mouth every 8 (eight) hours as needed. 09/05/19   Reed, Tiffany L, DO  Alcohol Swabs (B-D SINGLE USE SWABS REGULAR) PADS Use with testing of blood sugar. Dx:E11.8 03/25/17   Reed, Tiffany L, DO  allopurinol (ZYLOPRIM) 100 MG tablet Take 1 tablet (100 mg total) by mouth daily. 09/05/19   Reed, Tiffany L, DO  amLODipine (NORVASC) 5 MG tablet Take 1 tablet (5 mg total) by mouth daily. 09/05/19   Reed, Tiffany L, DO  aspirin EC 81 MG tablet Take 81 mg by mouth daily.    [provider]  atorvastatin (LIPITOR) 80 MG tablet Take 1 tablet (80 mg total) by mouth daily. 09/05/19    Reed, Tiffany L, DO  citalopram (CELEXA) 20 MG tablet Take 1 tablet (20 mg total) by mouth daily. 09/05/19   Reed, Tiffany L, DO  docusate sodium (COLACE) 100 MG capsule Take 1 capsule (100 mg total) by mouth every 12 (twelve) hours. 09/05/19   Reed, Tiffany L, DO  glucose blood (ACCU-CHEK AVIVA PLUS) test strip Use to check blood sugar daily. Dx:E11.8 09/05/19   Reed, Tiffany L, DO  metoprolol tartrate (LOPRESSOR) 50 MG tablet Take 1 tablet (50 mg total) by mouth  2 (two) times daily. 09/05/19   Reed, Tiffany L, DO  nitroGLYCERIN (NITROSTAT) 0.4 MG SL tablet Place 1 tablet (0.4 mg total) under the tongue every 5 (five) minutes as needed for chest pain. Up to 3 doses 09/05/19   Reed, Tiffany L, DO  ULTRA-THIN II MINI PEN NEEDLE 31G X 5 MM MISC  08/29/14   [provider]    Allergies    Lisinopril, Losartan potassium, and Crestor [rosuvastatin]  Review of Systems   Review of Systems  Constitutional: Negative for fever.  HENT: Positive for nosebleeds.   Respiratory: Negative for shortness of breath.   Cardiovascular: Negative for chest pain.       Chronic leg swelling  Neurological: Negative for weakness.  All other systems reviewed and are negative.   Physical Exam Updated Vital Signs BP (!) 159/95 (BP Location: Left Arm)   Pulse 69   Temp 97.7 F (36.5 C) (Oral)   Resp 17   Ht 1.727 m (5\' 8" )   Wt 75 kg   SpO2 99%   BMI 25.14 kg/m   Physical Exam CONSTITUTIONAL: Elderly, no distress HEAD: Normocephalic/atraumatic EYES: EOMI/PERRL ENMT: Mucous membranes moist, no blood noted in either nare.  No blood in oropharynx NECK: supple no meningeal signs CV: S1/S2 noted LUNGS: Lungs are clear to auscultation bilaterally, no apparent distress ABDOMEN: soft, nontender NEURO: Pt is awake/alert/appropriate, moves all extremitiesx4.  No facial droop.  EXTREMITIES: pulses normal/equal, full ROM, chronic edema that is symmetric bilateral lower extremities SKIN: warm, color  normal PSYCH: no abnormalities of mood noted, alert and oriented to situation  ED Results / Procedures / Treatments   Labs (all labs ordered are listed, but only abnormal results are displayed) Labs Reviewed - No data to display  EKG None  Radiology No results found.  Procedures Procedures   Medications Ordered in ED Medications - No data to display  ED Course  I have reviewed the triage vital signs and the nursing notes.     MDM Rules/Calculators/A&P                          Patient presents for epistaxis that has since resolved.  He is well-appearing.  No active bleed at this time.  No indication for labs at this time.  He is not on anticoagulants.  Blood pressure is elevated, but no other acute findings.  He is appropriate for discharge home Final Clinical Impression(s) / ED Diagnoses Final diagnoses:  Right-sided epistaxis    Rx / DC Orders ED Discharge Orders    None       Ripley Fraise, MD 10/01/19 [phone]

## 2019-11-10 ENCOUNTER — Telehealth (INDEPENDENT_AMBULATORY_CARE_PROVIDER_SITE_OTHER): Payer: Medicare Other | Admitting: Adult Health

## 2019-11-10 ENCOUNTER — Other Ambulatory Visit: Payer: Self-pay

## 2019-11-10 ENCOUNTER — Encounter: Payer: Self-pay | Admitting: Adult Health

## 2019-11-10 ENCOUNTER — Telehealth: Payer: Self-pay

## 2019-11-10 VITALS — Ht 68.0 in | Wt 165.3 lb

## 2019-11-10 DIAGNOSIS — J00 Acute nasopharyngitis [common cold]: Secondary | ICD-10-CM

## 2019-11-10 NOTE — Progress Notes (Signed)
This service is provided via telemedicine  No vital signs collected/recorded due to the encounter was a telemedicine visit.   Location of patient (ex: home, work):  Home  Patient consents to a telephone visit:  Yes  Location of the provider (ex: office, home):  [facility] office  Name of any referring provider:  N/A  Names of all persons participating in the telemedicine service and their role in the encounter:  Durenda Age, NP; Bonney Leitz, CMA; patient  Time spent on call:  9 minutes.      DATE:  11/10/2019 MRN:  [id number]  BIRTHDAY: 06/07/1930   Contact Information    Name Relation Home Work Mobile   [place] Sister [phone]     Norm Parcel   [phone]       Code Status History    Date Active Date Inactive Code Status Order ID Comments User Context   01/06/2019 1408 04/22/2019 1125 DNR [id number] Discussed at clinic visit, scanned copy should be in documents and media Gayland Curry, DO Outpatient   12/26/2012 0210 12/27/2012 1727 Full Code [id number]  Berle Mull, MD Inpatient   Advance Care Planning Activity    Questions for Most Recent Historical Code Status (Order [id number])    Question Answer   In the event of cardiac or respiratory ARREST Do not call a "code blue"   In the event of cardiac or respiratory ARREST Do not perform Intubation, CPR, defibrillation or ACLS   In the event of cardiac or respiratory ARREST Use medication by any route, position, wound care, and other measures to relive pain and suffering. May use oxygen, suction and manual treatment of airway obstruction as needed for comfort.       Chief Complaint  Patient presents with  . Acute Visit    Cough, congestion, sneezing.     HISTORY OF PRESENT ILLNESS:  This is an 86 year old male who had complains of having hoarse voice and cough which started yesterday. He stated that he was "sneezing a lot yesterday but not today". He denies loss of taste, smell nor SOB. He had Pfizer  COVID-19 vaccine X 2. He denies having in contact with any person who he knows is sick. What he started doing was drinking hot lemon water with honey. He has a PMH of anemia of chronic kidney disease, bilateral inguinal hernia and CAD.   PAST MEDICAL HISTORY:  Past Medical History:  Diagnosis Date  . Anemia due to chronic kidney disease   . Bilateral inguinal hernia   . CAD (coronary artery disease) cardiologist-  dr bensimhon   PTCA of OM in 1998, [place] OM in 2000, Cath 9/07 LM ok LAD ok. LCX 95% in OM prior to previous stent RCA. nondominant normal EF normal. Cypher DES to OM 2007 (PLACED PROXIAMAL TO PREVIOUS STENT)  . Chronic kidney disease, stage III (moderate)    nephrologist-  dr detarding  . Depression   . Diabetes mellitus type 2, diet-controlled ([place])    followed by pcp,  last A1c 5.2 on 09-10-2017 in epic  . Gout, unspecified    10-29-2017  per pt stable  . HLD (hyperlipidemia)   . HTN (hypertension)   . MGUS (monoclonal gammopathy of unknown significance) previously followed by dr Beryle Beams , lov and released 08/01/2011   IgG kappa dx 2002 8% plasma cells in bone marrow; no lesions on bone X-rays;  . Nocturia   . OA (osteoarthritis)   . OSA on CPAP    per study 01-30-2004  severe  osa , AHI 51.6/hr  . PAD (peripheral artery disease) ([place])    05-21-2006  left renal artery stenosis, s/p balloon angioplasty and stenting;  last duplex 02/ 2012  normal , arteries patent  . S/P coronary artery stent placement    2000--  BMS x1  to OM;   2007-- DES x1  to OM proximal to previous stent  . Secondary hyperparathyroidism of renal origin ([place])   . Urgency of urination   . Wears glasses      CURRENT MEDICATIONS: Reviewed  Patient's Medications  New Prescriptions   No medications on file  Previous Medications   ACCU-CHEK SOFTCLIX LANCETS LANCETS    Use to test blood sugar daily. Dx:E11.8   ACETAMINOPHEN (TYLENOL) 325 MG TABLET    Take 1 tablet (325 mg total) by mouth every 8  (eight) hours as needed.   ALCOHOL SWABS (B-D SINGLE USE SWABS REGULAR) PADS    Use with testing of blood sugar. Dx:E11.8   ALLOPURINOL (ZYLOPRIM) 100 MG TABLET    Take 1 tablet (100 mg total) by mouth daily.   AMLODIPINE (NORVASC) 5 MG TABLET    Take 1 tablet (5 mg total) by mouth daily.   ASPIRIN EC 81 MG TABLET    Take 81 mg by mouth daily.   ATORVASTATIN (LIPITOR) 80 MG TABLET    Take 1 tablet (80 mg total) by mouth daily.   CITALOPRAM (CELEXA) 20 MG TABLET    Take 1 tablet (20 mg total) by mouth daily.   DOCUSATE SODIUM (COLACE) 100 MG CAPSULE    Take 1 capsule (100 mg total) by mouth every 12 (twelve) hours.   GLUCOSE BLOOD (ACCU-CHEK AVIVA PLUS) TEST STRIP    Use to check blood sugar daily. Dx:E11.8   METOPROLOL TARTRATE (LOPRESSOR) 50 MG TABLET    Take 1 tablet (50 mg total) by mouth 2 (two) times daily.   NITROGLYCERIN (NITROSTAT) 0.4 MG SL TABLET    Place 1 tablet (0.4 mg total) under the tongue every 5 (five) minutes as needed for chest pain. Up to 3 doses   ULTRA-THIN II MINI PEN NEEDLE 31G X 5 MM MISC      Modified Medications   No medications on file  Discontinued Medications   No medications on file     Allergies  Allergen Reactions  . Lisinopril Swelling    angioedema  . Losartan Potassium Swelling    Angioedema   . Crestor [Rosuvastatin] Swelling    angioedema     REVIEW OF SYSTEMS:  GENERAL: no change in appetite, no fatigue, no weight changes, no fever, chills or weakness SKIN: Denies rash, itching, wounds, ulcer sores, or nail abnormality MOUTH and THROAT: Denies oral discomfort, gingival pain or bleeding RESPIRATORY: +cough and sneezing, no SOB, no wheezing, no hemoptysis CARDIAC: no chest pain or palpitations GI: no abdominal pain, diarrhea, constipation, heart burn, nausea or vomiting GU: Denies dysuria, frequency, hematuria or discharge NEUROLOGICAL: Denies dizziness, syncope, numbness, or headache PSYCHIATRIC: Denies feeling of depression or anxiety.  No report of hallucinations, insomnia, paranoia, or agitation   LABS/RADIOLOGY: Labs reviewed: Basic Metabolic Panel: Recent Labs    11/16/18 1245 05/05/19 1005 09/02/19 0954  NA 140 140 140  K 4.1 4.1 4.1  CL 107 108 108  CO2 25 23 23   GLUCOSE 96 87 119*  BUN 18 22 16   CREATININE 1.30* 1.48* 1.42*  CALCIUM 9.7 9.1 9.1   Liver Function Tests: Recent Labs    11/16/18 1245 05/05/19 1005 09/02/19  0954  AST 18 17 18   ALT 13 13 11   ALKPHOS 70  --   --   BILITOT 0.4 0.4 0.5  PROT 7.8 6.7 7.1  ALBUMIN 4.2  --   --    CBC: Recent Labs    11/16/18 1245 05/05/19 1005 09/02/19 0954  WBC 7.9 8.5 6.6  NEUTROABS 6.4 6,426 4,831  HGB 12.0* 10.8* 12.0*  HCT 36.6* 33.1* 36.2*  MCV 93.1 92.2 93.1  PLT 343 384 373   Lipid Panel: Recent Labs    05/05/19 1005  HDL 50   CBG: Recent Labs    11/29/18 1041  GLUCAP 87     ASSESSMENT/PLAN:  1. Common cold -  Denies fever, bodyache and had Pfizer COVID-19 vaccine X 2  - advised that he needs to drink a lot of fluids, eat 3 meals a day  -  Discussed that if he doesn't feel any better in 7-14 days, then he needs to call the office and schedule a clinic visit    Time spent on non face to face visit:  11 minutes  The patient gave consent to this telephone visit. Explained to the patient the risk and privacy issue that was involved with this telephone call.   The patient was advised to call back and ask for an in-person evaluation if the symptoms worsen or if the condition fails to improve.   Durenda Age, NP [employer] [phone]

## 2019-11-10 NOTE — Patient Instructions (Signed)
Adenovirus Infection, Adult Adenoviruses are common viruses that cause many types of infections. These viruses may affect the nose, throat, windpipe, and lungs (respiratory system), as well as other parts of the body, including the eyes, stomach, bowels, bladder, and brain. The most common type of adenovirus infection is the common cold. Usually, adenovirus infections are not severe. However, they can become severe in people who have another health problem that makes it hard to fight off infection. What are the causes? This condition is caused by an adenovirus entering your body. Some ways this can happen are:  Touching a surface or object that has an adenovirus on it and then touching your mouth, nose, or eyes with unwashed hands.  Coming in close physical contact with someone who has this type of infection. This may happen if you hug or shake hands with the person.  Breathing in droplets that fly through the air when someone who has the infection talks, coughs, or sneezes.  Having contact with stool (feces) that has the virus in it.  Using a swimming pool that does not have enough chlorine in it. Chlorine is a chemical that kills germs. Adenoviruses can live outside the body for a long time. They spread easily from person to person (are contagious). What increases the risk? The following factors may make you more likely to develop this condition:  Spending a lot of time in places where there are many people. These include schools, summer camps, day care centers, community centers, and training centers for people who join the [employer]  Being an older adult.  Having a weak immune system. This is the body's defense system.  Having a disease of the respiratory system.  Having a heart condition. What are the signs or symptoms? Adenovirus infections usually cause flu-like symptoms. When the virus enters the body, symptoms of this condition can take up to 14 days to develop. Symptoms may  include:  Having lung and breathing problems, such as: ? Cough. ? Trouble breathing. ? Runny nose or stuffy (congested) nose.  Feeling aches and pains, including: ? Headache. ? Stiff neck. ? Sore throat. ? Ear pain or congested ears. ? Stomachache.  Having digestive problems, such as: ? Feeling nauseous or vomiting. ? Having diarrhea.  Having a fever.  Having eye problems, such as pink eye (conjunctivitis), causing inflammation and redness.  Rash.  Less common symptoms include: ? Being confused or not knowing the time of day or where you are (disoriented). ? Having blood in your urine or having pain while urinating. How is this diagnosed? This condition may be diagnosed based on your symptoms and a physical exam. Your health care provider may order tests to make sure your symptoms are not caused by another problem. Tests can include:  Blood tests.  Urine tests.  Stool tests.  Chest X-ray.  Tests of tissue or mucus from your throat. How is this treated? This condition goes away on its own with time. Treatment for this condition involves managing symptoms until they go away. Your health care provider may recommend:  Getting plenty of rest.  Drinking more fluids than usual.  Taking over-the-counter medicine to help relieve a sore throat, fever, or headache. Follow these instructions at home:  Lifestyle  Do not drink alcohol.  Do not use any products that contain nicotine or tobacco, such as cigarettes, e-cigarettes, and chewing tobacco. If you need help quitting, ask your health care provider. General instructions  Take over-the-counter and prescription medicines only as told by  your health care provider.  Rest at home until your symptoms go away.  Drink enough fluid to keep your urine pale yellow.  If you have a sore throat, gargle with a salt-water mixture 3-4 times a day or as needed. To make a salt-water mixture, completely dissolve -1 tsp (3-6 g) of  salt in 1 cup (237 mL) of warm water.  Keep all follow-up visits as told by your health care provider. This is important.  Return to your normal activities as told by your health care provider. Ask your health care provider what activities are safe for you. How is this prevented?     Adenoviruses often are not killed by cleaning products and can remain on surfaces for a long time. To help prevent becoming infected or spreading infection:  Wash your hands often with soap and water. If soap and water are not available, use hand sanitizer.  Cover your mouth when you cough. Cover your nose and mouth when you sneeze.  Do not touch your eyes, nose, or mouth with unwashed hands, and wash hands after touching these areas.  Clean commonly used objects often.  Do not use a swimming pool that does not have enough chlorine in it.  Avoid close contact with people who are sick.  Do not go to school or work when you are sick.  Do not share cups or eating utensils. Where to find more information  Centers for Disease Control and Prevention: http://www.wolf.info/ Contact a health care provider if:  Your symptoms stay the same after 10 days.  Your symptoms get worse.  You cannot eat or drink without vomiting. Get help right away if:  You have trouble breathing or you are breathing quickly.  Your skin, lips, or fingernails look blue.  Your heart is beating fast.  You become confused.  You lose consciousness. These symptoms may represent a serious problem that is an emergency. Do not wait to see if the symptoms will go away. Get medical help right away. Call your local emergency services (911 in the [place] Do not drive yourself to the hospital. Summary  The most common type of adenovirus infection is the common cold.  This condition goes away on its own with time.  Adenoviruses can live outside the body for a long time. They spread easily from person to person (are contagious).  Rest at home  until your symptoms go away.  Contact a health care provider if your symptoms stay the same after 10 days. This information is not intended to replace advice given to you by your health care provider. Make sure you discuss any questions you have with your health care provider. Document Revised: 10/20/2018 Document Reviewed: 10/20/2018 Elsevier Patient Education  [place].

## 2019-11-10 NOTE — Telephone Encounter (Signed)
Kevin Schwartz, Kevin Schwartz are scheduled for a virtual visit with your provider today.    Just as we do with appointments in the office, we must obtain your consent to participate.  Your consent will be active for this visit and any virtual visit you may have with one of our providers in the next 365 days.    If you have a MyChart account, I can also send a copy of this consent to you electronically.  All virtual visits are billed to your insurance company just like a traditional visit in the office.  As this is a virtual visit, video technology does not allow for your provider to perform a traditional examination.  This may limit your provider's ability to fully assess your condition.  If your provider identifies any concerns that need to be evaluated in person or the need to arrange testing such as labs, EKG, etc, we will make arrangements to do so.    Although advances in technology are sophisticated, we cannot ensure that it will always work on either your end or our end.  If the connection with a video visit is poor, we may have to switch to a telephone visit.  With either a video or telephone visit, we are not always able to ensure that we have a secure connection.   I need to obtain your verbal consent now.   Are you willing to proceed with your visit today?   Kevin Schwartz has provided verbal consent on 11/10/2019 for a virtual visit (video or telephone).   Elmore Guise, [facility] 11/10/2019  3:48 PM

## 2020-01-05 ENCOUNTER — Ambulatory Visit: Payer: Medicare Other | Admitting: Internal Medicine

## 2020-01-12 ENCOUNTER — Ambulatory Visit: Payer: Medicare Other | Admitting: Internal Medicine

## 2020-01-13 ENCOUNTER — Encounter (HOSPITAL_COMMUNITY): Payer: Self-pay | Admitting: Emergency Medicine

## 2020-01-13 DIAGNOSIS — Z79899 Other long term (current) drug therapy: Secondary | ICD-10-CM | POA: Insufficient documentation

## 2020-01-13 DIAGNOSIS — I129 Hypertensive chronic kidney disease with stage 1 through stage 4 chronic kidney disease, or unspecified chronic kidney disease: Secondary | ICD-10-CM | POA: Diagnosis not present

## 2020-01-13 DIAGNOSIS — Z743 Need for continuous supervision: Secondary | ICD-10-CM | POA: Diagnosis not present

## 2020-01-13 DIAGNOSIS — Z96652 Presence of left artificial knee joint: Secondary | ICD-10-CM | POA: Insufficient documentation

## 2020-01-13 DIAGNOSIS — N183 Chronic kidney disease, stage 3 unspecified: Secondary | ICD-10-CM | POA: Insufficient documentation

## 2020-01-13 DIAGNOSIS — Z471 Aftercare following joint replacement surgery: Secondary | ICD-10-CM | POA: Diagnosis not present

## 2020-01-13 DIAGNOSIS — Z7982 Long term (current) use of aspirin: Secondary | ICD-10-CM | POA: Diagnosis not present

## 2020-01-13 DIAGNOSIS — Z87891 Personal history of nicotine dependence: Secondary | ICD-10-CM | POA: Insufficient documentation

## 2020-01-13 DIAGNOSIS — E1122 Type 2 diabetes mellitus with diabetic chronic kidney disease: Secondary | ICD-10-CM | POA: Insufficient documentation

## 2020-01-13 DIAGNOSIS — M25562 Pain in left knee: Secondary | ICD-10-CM | POA: Insufficient documentation

## 2020-01-13 DIAGNOSIS — I2510 Atherosclerotic heart disease of native coronary artery without angina pectoris: Secondary | ICD-10-CM | POA: Diagnosis not present

## 2020-01-13 NOTE — ED Triage Notes (Signed)
Per EMS, patient from [place] where he called [employer] reporting that the shelters are closed. C/o left knee pain for years since knee replacement worsening today. States pain worsens with walking. Ambulatory with cane.

## 2020-01-14 ENCOUNTER — Other Ambulatory Visit: Payer: Self-pay

## 2020-01-14 ENCOUNTER — Emergency Department (HOSPITAL_COMMUNITY)
Admission: EM | Admit: 2020-01-14 | Discharge: 2020-01-14 | Disposition: A | Discharge status: Home / Self Care | Payer: Medicare Other | Attending: Emergency Medicine | Admitting: Emergency Medicine

## 2020-01-14 ENCOUNTER — Emergency Department (HOSPITAL_COMMUNITY): Payer: Medicare Other

## 2020-01-14 DIAGNOSIS — Z471 Aftercare following joint replacement surgery: Secondary | ICD-10-CM | POA: Diagnosis not present

## 2020-01-14 DIAGNOSIS — M25562 Pain in left knee: Secondary | ICD-10-CM | POA: Diagnosis not present

## 2020-01-14 DIAGNOSIS — Z96652 Presence of left artificial knee joint: Secondary | ICD-10-CM | POA: Diagnosis not present

## 2020-01-14 MED ORDER — ACETAMINOPHEN 325 MG PO TABS
650.0000 mg | ORAL_TABLET | Freq: Once | ORAL | Status: AC
  Administered 2020-01-14: 650 mg via ORAL
  Filled 2020-01-14: qty 2

## 2020-01-14 NOTE — Discharge Instructions (Signed)
Please read and follow all provided instructions.  You have been seen today for left knee pain.   Tests performed today include: An x-ray of the affected area - does NOT show any broken bones or dislocations.  Vital signs. See below for your results today.   Home care instructions: -- *PRICE in the first 24-48 hours after injury: Protec  Rest Ice- Do not apply ice pack directly to your skin, place towel or similar between your skin and ice/ice pack. Apply ice for 20 min, then remove for 40 min while awake Compression- Wear brace, elastic bandage- can use ace wrap as needed Elevate affected extremity above the level of your heart when not walking around for the first 24-48 hours   Medications:  Please take tylenol per over the counter dosing as needed.   Follow-up instructions: Please follow-up with your primary care provider or the provided orthopedic physician (bone specialist) if you continue to have significant pain in 1 week. In this case you may have a more severe injury that requires further care.   Return instructions:  Please return if your digits or extremity are numb or tingling, appear gray or blue, or you have severe pain (also elevate the extremity and loosen splint or wrap if you were given one) Please return if you have redness or fevers.  Please return to the Emergency Department if you experience worsening symptoms.  Please return if you have any other emergent concerns. Additional Information:  Your vital signs today were: BP (!) 145/73   Pulse 74   Temp 98.2 F (36.8 C) (Oral)   Resp 18   SpO2 97%  If your blood pressure (BP) was elevated above 135/85 this visit, please have this repeated by your doctor within one month. ---------------
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
## 2020-01-19 ENCOUNTER — Ambulatory Visit: Payer: Medicare Other | Admitting: Internal Medicine

## 2020-02-16 ENCOUNTER — Encounter: Payer: Self-pay | Admitting: Internal Medicine

## 2020-02-16 ENCOUNTER — Other Ambulatory Visit: Payer: Self-pay

## 2020-02-16 ENCOUNTER — Ambulatory Visit (INDEPENDENT_AMBULATORY_CARE_PROVIDER_SITE_OTHER): Payer: Medicare Other | Admitting: Internal Medicine

## 2020-02-16 VITALS — BP 132/80 | HR 69 | Temp 97.5°F | Resp 16 | Ht 68.0 in | Wt 154.4 lb

## 2020-02-16 DIAGNOSIS — D472 Monoclonal gammopathy: Secondary | ICD-10-CM

## 2020-02-16 DIAGNOSIS — E1169 Type 2 diabetes mellitus with other specified complication: Secondary | ICD-10-CM

## 2020-02-16 DIAGNOSIS — E1122 Type 2 diabetes mellitus with diabetic chronic kidney disease: Secondary | ICD-10-CM

## 2020-02-16 DIAGNOSIS — N183 Chronic kidney disease, stage 3 unspecified: Secondary | ICD-10-CM | POA: Diagnosis not present

## 2020-02-16 DIAGNOSIS — R634 Abnormal weight loss: Secondary | ICD-10-CM | POA: Diagnosis not present

## 2020-02-16 DIAGNOSIS — I10 Essential (primary) hypertension: Secondary | ICD-10-CM

## 2020-02-16 DIAGNOSIS — Z23 Encounter for immunization: Secondary | ICD-10-CM | POA: Diagnosis not present

## 2020-02-16 DIAGNOSIS — E782 Mixed hyperlipidemia: Secondary | ICD-10-CM

## 2020-02-16 DIAGNOSIS — F4321 Adjustment disorder with depressed mood: Secondary | ICD-10-CM | POA: Insufficient documentation

## 2020-02-16 NOTE — Progress Notes (Signed)
Careteam: Patient Care Team: Gayland Curry, DO as PCP - General (Geriatric Medicine) Bensimhon, Shaune Pascal, MD as Consulting Physician (Cardiology) Penninger, Ria Comment, [place] as Physician Assistant (Nephrology) Donato Heinz, MD as Consulting Physician (Nephrology) Michael Boston, MD as Consulting Physician (General Surgery) Ralene Bathe, MD as Consulting Physician (Ophthalmology)  PLACE OF SERVICE:  [place]  Advanced Directive information    Allergies  Allergen Reactions  . Lisinopril Swelling    angioedema  . Losartan Potassium Swelling    Angioedema   . Crestor [Rosuvastatin] Swelling    angioedema    No chief complaint on file.   HPI: Patient is a 86 y.o. male seen today for medical management of chronic conditions.   HPOA present for exam. Paperwork given to be scanned.   Denies any left knee pain. Still using cane to help walk. No recent falls or injuries.   Takes metoprolol daily. Denies any recent chest pain, SOB, or blurred vision. Does not check blood pressure often.   Has lost weight since last visit. Attributes weight loss to stress and loss of brother. His brother passed last Friday. He states he is sad and frustrated with his daughters.  Eating 3 meals a day. Avoids salt. Drinks 3-6 glasses of water daily.   Taking cholesterol medication daily. Limits fats and fried food from his diet.   No recent gout flare ups while on allopurinol.   Still planning on having his eyes checked.   Plans on seeing podiatrist soon to have toenail clipped.   Getting covid booster after this appointment.   Requesting flu vaccination today.           Review of Systems:  Review of Systems  Constitutional: Positive for weight loss. Negative for malaise/fatigue.  HENT: Negative for hearing loss and nosebleeds.        Glasses  Eyes: Negative for blurred vision.  Respiratory: Negative for shortness of breath and wheezing.   Cardiovascular: Negative  for chest pain and leg swelling.  Gastrointestinal: Negative for abdominal pain and constipation.  Genitourinary: Negative for dysuria and hematuria.  Musculoskeletal: Negative for joint pain and myalgias.  Neurological: Negative for weakness and headaches.  Psychiatric/Behavioral: Positive for depression and memory loss.    Past Medical History:  Diagnosis Date  . Anemia due to chronic kidney disease   . Bilateral inguinal hernia   . CAD (coronary artery disease) cardiologist-  dr bensimhon   PTCA of OM in 1998, [place] OM in 2000, Cath 9/07 LM ok LAD ok. LCX 95% in OM prior to previous stent RCA. nondominant normal EF normal. Cypher DES to OM 2007 (PLACED PROXIAMAL TO PREVIOUS STENT)  . Chronic kidney disease, stage III (moderate) [facility])    nephrologist-  dr detarding  . Depression   . Diabetes mellitus type 2, diet-controlled ([place])    followed by pcp,  last A1c 5.2 on 09-10-2017 in epic  . Gout, unspecified    10-29-2017  per pt stable  . HLD (hyperlipidemia)   . HTN (hypertension)   . MGUS (monoclonal gammopathy of unknown significance) previously followed by dr Beryle Beams , lov and released 08/01/2011   IgG kappa dx 2002 8% plasma cells in bone marrow; no lesions on bone X-rays;  . Nocturia   . OA (osteoarthritis)   . OSA on CPAP    per study 01-30-2004  severe osa , AHI 51.6/hr  . PAD (peripheral artery disease) ([place])    05-21-2006  left renal artery stenosis, s/p balloon angioplasty  and stenting;  last duplex 02/ 2012  normal , arteries patent  . S/P coronary artery stent placement    2000--  BMS x1  to OM;   2007-- DES x1  to OM proximal to previous stent  . Secondary hyperparathyroidism of renal origin ([place])   . Urgency of urination   . Wears glasses    Past Surgical History:  Procedure Laterality Date  . CARDIOVASCULAR STRESS TEST  2010   per dr bensimhon epic note dated 04-18-2016  normal  . [place]   PTCA to OM  . CORONARY ANGIOPLASTY WITH STENT  PLACEMENT  2000   PCI and BMS x1 OM  . CORONARY ANGIOPLASTY WITH STENT PLACEMENT  12-18-2005  dr Claiborne Billings   DES x1 to proximal to previously placed stent in OM  . HERNIA REPAIR    . INGUINAL HERNIA REPAIR Bilateral 10/30/2017   Procedure: LAPAROSCOPIC  BILATERAL INGUINAL HERNIA REPAIR  AND LEFT [place];  Surgeon: Michael Boston, MD;  Location: [place];  Service: General;  Laterality: Bilateral;  . INSERTION OF MESH Bilateral 10/30/2017   Procedure: INSERTION OF MESH;  Surgeon: Michael Boston, MD;  Location: [place];  Service: General;  Laterality: Bilateral;  . RENAL ANGIOPLASTY Left 05-21-2006   dr berry   and stenting  . TOTAL KNEE ARTHROPLASTY Left 07-22-2005   dr Shellia Carwin  [facility]  . TRANSTHORACIC ECHOCARDIOGRAM  04/18/2016   ef 13-08%, grade 1 diastolic dysfunction/  mild to moderate AR/  mild MR   Social History:   reports that he quit smoking about 2 years ago. His smoking use included cigarettes. He quit after 54.00 years of use. He has never used smokeless tobacco. He reports previous alcohol use. He reports that he does not use drugs.  Family History  Problem Relation Age of Onset  . Heart disease Mother   . Healthy Father   . Heart disease Sister   . Heart disease Sister   . Cancer Sister        type unknown  . Hypertension Sister   . Diabetes Brother   . Diabetes Brother   . Diabetes Other   . Coronary artery disease Other   . Cancer Other     Medications: Patient's Medications  New Prescriptions   No medications on file  Previous Medications   ACCU-CHEK SOFTCLIX LANCETS LANCETS    Use to test blood sugar daily. Dx:E11.8   ACETAMINOPHEN (TYLENOL) 325 MG TABLET    Take 1 tablet (325 mg total) by mouth every 8 (eight) hours as needed.   ALCOHOL SWABS (B-D SINGLE USE SWABS REGULAR) PADS    Use with testing of blood sugar. Dx:E11.8   ALLOPURINOL (ZYLOPRIM) 100 MG TABLET    Take 1 tablet (100 mg total) by mouth daily.    AMLODIPINE (NORVASC) 5 MG TABLET    Take 1 tablet (5 mg total) by mouth daily.   ASPIRIN EC 81 MG TABLET    Take 81 mg by mouth daily.   ATORVASTATIN (LIPITOR) 80 MG TABLET    Take 1 tablet (80 mg total) by mouth daily.   CITALOPRAM (CELEXA) 20 MG TABLET    Take 1 tablet (20 mg total) by mouth daily.   DOCUSATE SODIUM (COLACE) 100 MG CAPSULE    Take 1 capsule (100 mg total) by mouth every 12 (twelve) hours.   GLUCOSE BLOOD (ACCU-CHEK AVIVA PLUS) TEST STRIP    Use to check blood sugar daily. Dx:E11.8  METOPROLOL TARTRATE (LOPRESSOR) 50 MG TABLET    Take 1 tablet (50 mg total) by mouth 2 (two) times daily.   NITROGLYCERIN (NITROSTAT) 0.4 MG SL TABLET    Place 1 tablet (0.4 mg total) under the tongue every 5 (five) minutes as needed for chest pain. Up to 3 doses   ULTRA-THIN II MINI PEN NEEDLE 31G X 5 MM MISC      Modified Medications   No medications on file  Discontinued Medications   No medications on file    Physical Exam:  There were no vitals filed for this visit. There is no height or weight on file to calculate BMI. Wt Readings from Last 3 Encounters:  01/14/20 136 lb (61.7 kg)  11/10/19 165 lb 5.6 oz (75 kg)  09/30/19 165 lb 5.5 oz (75 kg)    Physical Exam Vitals reviewed.  Constitutional:      Appearance: Normal appearance.  HENT:     Head: Normocephalic.     Right Ear: There is no impacted cerumen.     Left Ear: There is no impacted cerumen.  Cardiovascular:     Rate and Rhythm: Normal rate and regular rhythm.     Pulses:          Dorsalis pedis pulses are 1+ on the right side and 1+ on the left side.     Heart sounds: Murmur heard.   Pulmonary:     Effort: Pulmonary effort is normal. No respiratory distress.     Breath sounds: Normal breath sounds.  Abdominal:     General: Abdomen is flat. Bowel sounds are normal.     Palpations: Abdomen is soft.  Musculoskeletal:     Right lower leg: No edema.     Left lower leg: No edema.  Feet:     Right foot:      Protective Sensation: 10 sites tested. 10 sites sensed.     Skin integrity: Dry skin present.     Toenail Condition: Right toenails are abnormally thick and long.     Left foot:     Protective Sensation: 10 sites tested. 10 sites sensed.     Skin integrity: Dry skin present.     Toenail Condition: Left toenails are abnormally thick.  Skin:    General: Skin is warm and dry.     Capillary Refill: Capillary refill takes less than 2 seconds.  Neurological:     General: No focal deficit present.     Mental Status: He is alert and oriented to person, place, and time.  Psychiatric:        Attention and Perception: Attention and perception normal.        Mood and Affect: Affect is tearful.    Labs reviewed: Basic Metabolic Panel: Recent Labs    05/05/19 1005 09/02/19 0954  NA 140 140  K 4.1 4.1  CL 108 108  CO2 23 23  GLUCOSE 87 119*  BUN 22 16  CREATININE 1.48* 1.42*  CALCIUM 9.1 9.1  TSH  --  2.48   Liver Function Tests: Recent Labs    05/05/19 1005 09/02/19 0954  AST 17 18  ALT 13 11  BILITOT 0.4 0.5  PROT 6.7 7.1   No results for input(s): LIPASE, AMYLASE in the last 8760 hours. No results for input(s): AMMONIA in the last 8760 hours. CBC: Recent Labs    05/05/19 1005 09/02/19 0954  WBC 8.5 6.6  NEUTROABS 6,426 4,831  HGB 10.8* 12.0*  HCT 33.1* 36.2*  MCV 92.2 93.1  PLT 384 373   Lipid Panel: Recent Labs    05/05/19 1005  CHOL 146  HDL 50  LDLCALC 80  TRIG 77  CHOLHDL 2.9   TSH: Recent Labs    09/02/19 0954  TSH 2.48   A1C: Lab Results  Component Value Date   HGBA1C 5.3 09/02/2019     Assessment/Plan 1. Need for influenza vaccination - Flu Vaccine QUAD High Dose(Fluad)  2. Weight loss, non-intentional - suspect brothers declining health and recent death reason for weight loss - encourage eating 3 meals a day - encourage meal supplement shakes like Glucerna  3. Controlled type 2 diabetes mellitus with stage 3 chronic kidney disease,  without long-term current use of insulin (HCC) - a1c well controlled, resume current plan - Basic metabolic panel- future - Microalbumin / creatinine urine ratio- today  4. Mixed hyperlipidemia due to type 2 diabetes mellitus (HCC) - stable with low fat diet, resume current medication regimen  5. MGUS (monoclonal gammopathy of unknown significance) - followed by Dr. Irene Limbo - stable at this time, continue current treatment plan - CBC with Differential/Platelet- future  6. Essential hypertension, benign - BP at goal, 150/90, continue current medication regimen - continue low sodium diet - CBC with Differential/Platelet- future  7. Grieving - healthy grieving and coping strategies discussed with patient and HPOA  Next appt: 06/01/2020 Labs: urine microalbumin- today, CBC/diff, BMP- future  Genelda Roark L. Numa Heatwole, D.O. [facility] [address], [place] [address] Cell Phone (Mon-Fri 8am-5pm):  [phone] On Call:  [phone] & follow prompts after 5pm & weekends Office Phone:  [phone] Office Fax:  [phone]     [facility] [phone]

## 2020-02-17 LAB — MICROALBUMIN / CREATININE URINE RATIO
Creatinine, Urine: 166 mg/dL (ref 20–320)
Microalb Creat Ratio: 83 mcg/mg creat — ABNORMAL HIGH (ref <30)
Microalb, Ur: 13.8 mg/dL

## 2020-02-20 NOTE — Progress Notes (Signed)
Mild proteinuria with angioedema from ace/arb so cannot intervene with medication.  No changes

## 2020-03-31 ENCOUNTER — Other Ambulatory Visit: Payer: Self-pay

## 2020-03-31 ENCOUNTER — Emergency Department (HOSPITAL_COMMUNITY): Payer: Medicare Other

## 2020-03-31 ENCOUNTER — Emergency Department (HOSPITAL_COMMUNITY)
Admission: EM | Admit: 2020-03-31 | Discharge: 2020-04-01 | Disposition: A | Discharge status: Home / Self Care | Payer: Medicare Other | Attending: Emergency Medicine | Admitting: Emergency Medicine

## 2020-03-31 ENCOUNTER — Encounter (HOSPITAL_COMMUNITY): Payer: Self-pay | Admitting: Emergency Medicine

## 2020-03-31 DIAGNOSIS — G8929 Other chronic pain: Secondary | ICD-10-CM | POA: Insufficient documentation

## 2020-03-31 DIAGNOSIS — Z471 Aftercare following joint replacement surgery: Secondary | ICD-10-CM | POA: Diagnosis not present

## 2020-03-31 DIAGNOSIS — M25562 Pain in left knee: Secondary | ICD-10-CM | POA: Insufficient documentation

## 2020-03-31 DIAGNOSIS — E1122 Type 2 diabetes mellitus with diabetic chronic kidney disease: Secondary | ICD-10-CM | POA: Insufficient documentation

## 2020-03-31 DIAGNOSIS — I2510 Atherosclerotic heart disease of native coronary artery without angina pectoris: Secondary | ICD-10-CM | POA: Diagnosis not present

## 2020-03-31 DIAGNOSIS — I129 Hypertensive chronic kidney disease with stage 1 through stage 4 chronic kidney disease, or unspecified chronic kidney disease: Secondary | ICD-10-CM | POA: Diagnosis not present

## 2020-03-31 DIAGNOSIS — Z96652 Presence of left artificial knee joint: Secondary | ICD-10-CM | POA: Insufficient documentation

## 2020-03-31 DIAGNOSIS — Z96642 Presence of left artificial hip joint: Secondary | ICD-10-CM | POA: Diagnosis not present

## 2020-03-31 DIAGNOSIS — F17210 Nicotine dependence, cigarettes, uncomplicated: Secondary | ICD-10-CM | POA: Insufficient documentation

## 2020-03-31 DIAGNOSIS — N183 Chronic kidney disease, stage 3 unspecified: Secondary | ICD-10-CM | POA: Insufficient documentation

## 2020-03-31 DIAGNOSIS — Z955 Presence of coronary angioplasty implant and graft: Secondary | ICD-10-CM | POA: Diagnosis not present

## 2020-03-31 NOTE — ED Triage Notes (Signed)
C/o L knee pain that started today.  No injury. History of fall 2 months ago.  Reports knee replacement 10 years ago.

## 2020-04-01 MED ORDER — LIDOCAINE 5 % EX PTCH: 1.0000 | MEDICATED_PATCH | CUTANEOUS | 0 refills | Status: DC

## 2020-04-01 MED ORDER — NAPROXEN 375 MG PO TABS: 375.0000 mg | ORAL_TABLET | Freq: Two times a day (BID) | ORAL | 0 refills | Status: DC

## 2020-04-01 NOTE — Discharge Instructions (Addendum)
You were evaluated in the Emergency Department and after careful evaluation, we did not find any emergent condition requiring admission or further testing in the hospital.  Your exam/testing today is overall reassuring.  X-ray does not show any broken bones.  Your pain seems to be due to arthritis.  Please use the Naprosyn anti-inflammatory as directed over the next few days.  You can also use the numbing patches provided.  We recommend calling the orthopedic specialists first thing Monday morning to set up a follow-up appointment.  Please return to the Emergency Department if you experience any worsening of your condition.   Thank you for allowing [place] to be a part of your care.

## 2020-04-01 NOTE — ED Provider Notes (Signed)
[facility] Emergency Department Provider Note MRN:  [id number]  Arrival date & time: 04/01/20     Chief Complaint   Knee Pain   History of Present Illness   Kevin Schwartz is a 86 y.o. year-old male with a history of CAD, CKD presenting to the ED with chief complaint of knee pain.  2 months of knee pain, worsening since falling 2 months ago.  Pain located in the left knee, constant but worse with motion or trying to walk.  Denies any other pain, no fever.  Pain is moderate to severe.  Review of Systems  A problem-focused ROS was performed. Positive for knee pain.  Patient denies fever.  Patient's Health History    Past Medical History:  Diagnosis Date  . Anemia due to chronic kidney disease   . Bilateral inguinal hernia   . CAD (coronary artery disease) cardiologist-  dr bensimhon   PTCA of OM in 1998, [place] OM in 2000, Cath 9/07 LM ok LAD ok. LCX 95% in OM prior to previous stent RCA. nondominant normal EF normal. Cypher DES to OM 2007 (PLACED PROXIAMAL TO PREVIOUS STENT)  . Chronic kidney disease, stage III (moderate) [facility])    nephrologist-  dr detarding  . Depression   . Diabetes mellitus type 2, diet-controlled ([place])    followed by pcp,  last A1c 5.2 on 09-10-2017 in epic  . Gout, unspecified    10-29-2017  per pt stable  . HLD (hyperlipidemia)   . HTN (hypertension)   . MGUS (monoclonal gammopathy of unknown significance) previously followed by dr Beryle Beams , lov and released 08/01/2011   IgG kappa dx 2002 8% plasma cells in bone marrow; no lesions on bone X-rays;  . Nocturia   . OA (osteoarthritis)   . OSA on CPAP    per study 01-30-2004  severe osa , AHI 51.6/hr  . PAD (peripheral artery disease) ([place])    05-21-2006  left renal artery stenosis, s/p balloon angioplasty and stenting;  last duplex 02/ 2012  normal , arteries patent  . S/P coronary artery stent placement    2000--  BMS x1  to OM;   2007-- DES x1  to OM proximal to  previous stent  . Secondary hyperparathyroidism of renal origin ([place])   . Urgency of urination   . Wears glasses     Past Surgical History:  Procedure Laterality Date  . CARDIOVASCULAR STRESS TEST  2010   per dr bensimhon epic note dated 04-18-2016  normal  . [place]   PTCA to OM  . CORONARY ANGIOPLASTY WITH STENT PLACEMENT  2000   PCI and BMS x1 OM  . CORONARY ANGIOPLASTY WITH STENT PLACEMENT  12-18-2005  dr Claiborne Billings   DES x1 to proximal to previously placed stent in OM  . HERNIA REPAIR    . INGUINAL HERNIA REPAIR Bilateral 10/30/2017   Procedure: LAPAROSCOPIC  BILATERAL INGUINAL HERNIA REPAIR  AND LEFT [place];  Surgeon:  Boston, MD;  Location: [place];  Service: General;  Laterality: Bilateral;  . INSERTION OF MESH Bilateral 10/30/2017   Procedure: INSERTION OF MESH;  Surgeon:  Boston, MD;  Location: [place];  Service: General;  Laterality: Bilateral;  . RENAL ANGIOPLASTY Left 05-21-2006   dr berry   and stenting  . TOTAL KNEE ARTHROPLASTY Left 07-22-2005   dr Shellia Carwin  [facility]  . TRANSTHORACIC ECHOCARDIOGRAM  04/18/2016   ef 37-10%, grade 1 diastolic dysfunction/  mild  to moderate AR/  mild MR    Family History  Problem Relation Age of Onset  . Heart disease Mother   . Healthy Father   . Heart disease Sister   . Heart disease Sister   . Cancer Sister        type unknown  . Hypertension Sister   . Diabetes Brother   . Diabetes Brother   . Diabetes Other   . Coronary artery disease Other   . Cancer Other     Social History   Socioeconomic History  . Marital status: Widowed    Spouse name: Not on file  . Number of children: 1  . Years of education: Not on file  . Highest education level: Not on file  Occupational History  . Occupation: retired   Tobacco Use  . Smoking status: Current Some Day Smoker    Packs/day: 0.25    Types: Cigarettes  . Smokeless tobacco: Never Used  . Tobacco  comment: 10-29-2017  per pt was smoking 1pp2d, stopped June 2019  Vaping Use  . Vaping Use: Never used  Substance and Sexual Activity  . Alcohol use: Not Currently  . Drug use: No  . Sexual activity: Not on file  Other Topics Concern  . Not on file  Social History Narrative   Widowed 02/2012   [place]ives along   Stopped smoking 2004   Alcohol none   Exercise works in [place] Strain: Not on file  Food Insecurity: Not on file  Transportation Needs: Not on file  Physical Activity: Not on file  Stress: Not on file  Social Connections: Not on file  Intimate Partner Violence: Not on file     Physical Exam   Vitals:   03/31/20 1930 04/01/20 0100  BP: (!) 159/72 (!) 146/67  Pulse: 77 70  Resp: 17 18  Temp: 98.8 F (37.1 C) 97.8 F (36.6 C)  SpO2: 100% 100%    CONSTITUTIONAL: Well-appearing, NAD NEURO:  Alert and oriented x 3, no focal deficits EYES:  eyes equal and reactive ENT/NECK:  no LAD, no JVD CARDIO: Well rate, well-perfused, normal S1 and S2 PULM:  CTAB no wheezing or rhonchi GI/GU:  normal bowel sounds, non-distended, non-tender MSK/SPINE:  No gross deformities, no edema, left knee is mildly tender to palpation but there is preserved range of motion and limb is neurovascularly intact distally SKIN:  no rash, atraumatic PSYCH:  Appropriate speech and behavior  *Additional and/or pertinent findings included in MDM below  Diagnostic and Interventional Summary    EKG Interpretation  Date/Time:    Ventricular Rate:    PR Interval:    QRS Duration:   QT Interval:    QTC Calculation:   R Axis:     Text Interpretation:        Labs Reviewed - No data to display  DG Knee Complete 4 Views Left  Final Result      Medications - No data to display   Procedures  /  Critical Care Procedures  ED Course and Medical Decision Making  I have reviewed the triage vital signs, the nursing  notes, and pertinent available records from the EMR.  Listed above are laboratory and imaging tests that I personally ordered, reviewed, and interpreted and then considered in my medical decision making (see below for details).  Knee pain, suspect chronic arthritis flared by recent fall.  Range  of motion is reassuring, highly doubt septic joint.  No signs of DVT, limb is neurovascularly intact, nothing to suggest emergent process, appropriate for discharge.       Barth Kirks. Sedonia Small, MD [place] [email or web address]  Final Clinical Impressions(s) / ED Diagnoses     ICD-10-CM   1. Chronic pain of left knee  M25.562    G89.29     ED Discharge Orders         Ordered    naproxen (NAPROSYN) 375 MG tablet  2 times daily        04/01/20 0444    lidocaine (LIDODERM) 5 %  Every 24 hours        04/01/20 0444           Discharge Instructions Discussed with and Provided to Patient:     Discharge Instructions     You were evaluated in the Emergency Department and after careful evaluation, we did not find any emergent condition requiring admission or further testing in the hospital.  Your exam/testing today is overall reassuring.  X-ray does not show any broken bones.  Your pain seems to be due to arthritis.  Please use the Naprosyn anti-inflammatory as directed over the next few days.  You can also use the numbing patches provided.  We recommend calling the orthopedic specialists first thing Monday morning to set up a follow-up appointment.  Please return to the Emergency Department if you experience any worsening of your condition.   Thank you for allowing [place] to be a part of your care.      Maudie Flakes, MD 04/01/20 [phone]

## 2020-06-01 ENCOUNTER — Other Ambulatory Visit: Payer: Self-pay

## 2020-06-01 ENCOUNTER — Ambulatory Visit (INDEPENDENT_AMBULATORY_CARE_PROVIDER_SITE_OTHER): Payer: Medicare Other | Admitting: Family

## 2020-06-01 ENCOUNTER — Encounter: Payer: Self-pay | Admitting: Family

## 2020-06-01 ENCOUNTER — Telehealth: Payer: Self-pay

## 2020-06-01 DIAGNOSIS — N183 Chronic kidney disease, stage 3 unspecified: Secondary | ICD-10-CM

## 2020-06-01 DIAGNOSIS — Z0000 Encounter for general adult medical examination without abnormal findings: Secondary | ICD-10-CM | POA: Diagnosis not present

## 2020-06-01 DIAGNOSIS — E1122 Type 2 diabetes mellitus with diabetic chronic kidney disease: Secondary | ICD-10-CM

## 2020-06-01 NOTE — Progress Notes (Signed)
This service is provided via telemedicine  No vital signs collected/recorded due to the encounter was a telemedicine visit.   Location of patient (ex: home, work): Home.  Patient consents to a telephone visit: Yes.  Location of the provider (ex: office, home):  [facility].  Name of any referring provider: Gayland Curry, DO   Names of all persons participating in the telemedicine service and their role in the encounter: Patient, Kevin Schwartz, [place], [place], Webb Silversmith, NP.    Time spent on call: 8 minutes spent on the phone with Medical Assistant.     Subjective:   Kevin Schwartz is a 87 y.o. male who presents for Medicare Annual/Subsequent preventive examination.  Review of Systems     Cardiac Risk Factors include: advanced age (>70men, >45 women);diabetes mellitus;male gender;hypertension;dyslipidemia;smoking/ tobacco exposure     Objective:    Today's Vitals   06/01/20 1117  PainSc: 9    There is no height or weight on file to calculate BMI.  Advanced Directives 06/01/2020 02/16/2020 01/13/2020 09/05/2019 05/31/2019 05/09/2019 11/07/2018  Does Patient Have a Medical Advance Directive? Yes Yes Yes No Yes Yes No  Type of Industrial/product designer of [employer] Power of [place];Living will - - Living will;Healthcare Power of Attorney -  Does patient want to make changes to medical advance directive? No - Patient declined No - Patient declined - - No - Patient declined No - Patient declined -  Copy of [place] in Chart? Yes - validated most recent copy scanned in chart (See row information) No - copy requested - - No - copy requested No - copy requested -  Would patient like information on creating a medical advance directive? - - - No - Patient declined - - -    Current Medications (verified) Outpatient Encounter Medications as of 06/01/2020  Medication Sig  . ACCU-CHEK SOFTCLIX LANCETS  lancets Use to test blood sugar daily. Dx:E11.8  . acetaminophen (TYLENOL) 325 MG tablet Take 1 tablet (325 mg total) by mouth every 8 (eight) hours as needed.  . Alcohol Swabs (B-D SINGLE USE SWABS REGULAR) PADS Use with testing of blood sugar. Dx:E11.8  . allopurinol (ZYLOPRIM) 100 MG tablet Take 1 tablet (100 mg total) by mouth daily.  [name] amLODipine (NORVASC) 5 MG tablet Take 1 tablet (5 mg total) by mouth daily.  [name] aspirin EC 81 MG tablet Take 81 mg by mouth daily.  [name] atorvastatin (LIPITOR) 80 MG tablet Take 1 tablet (80 mg total) by mouth daily.  . citalopram (CELEXA) 20 MG tablet Take 1 tablet (20 mg total) by mouth daily.  [name] glucose blood (ACCU-CHEK AVIVA PLUS) test strip Use to check blood sugar daily. Dx:E11.8  . lidocaine (LIDODERM) 5 % Place 1 patch onto the skin daily. Remove & Discard patch within 12 hours or as directed by MD  . metoprolol tartrate (LOPRESSOR) 50 MG tablet Take 1 tablet (50 mg total) by mouth 2 (two) times daily.  . naproxen (NAPROSYN) 375 MG tablet Take 1 tablet (375 mg total) by mouth 2 (two) times daily.  . nitroGLYCERIN (NITROSTAT) 0.4 MG SL tablet Place 1 tablet (0.4 mg total) under the tongue every 5 (five) minutes as needed for chest pain. Up to 3 doses  . ULTRA-THIN II MINI PEN NEEDLE 31G X 5 MM MISC    No facility-administered encounter medications on file as of 06/01/2020.    Allergies (verified) Lisinopril, Losartan potassium, and Crestor [  rosuvastatin]   History: Past Medical History:  Diagnosis Date  . Anemia due to chronic kidney disease   . Bilateral inguinal hernia   . CAD (coronary artery disease) cardiologist-  dr bensimhon   PTCA of OM in 1998, [place] OM in 2000, Cath 9/07 LM ok LAD ok. LCX 95% in OM prior to previous stent RCA. nondominant normal EF normal. [place] DES to OM 2007 (PLACED PROXIAMAL TO PREVIOUS STENT)  . Chronic kidney disease, stage III (moderate) [facility])    nephrologist-  dr detarding  . Depression   . Diabetes mellitus type  2, diet-controlled ([place])    followed by pcp,  last A1c 5.2 on 09-10-2017 in epic  . Gout, unspecified    10-29-2017  per pt stable  . HLD (hyperlipidemia)   . HTN (hypertension)   . MGUS (monoclonal gammopathy of unknown significance) previously followed by dr Beryle Beams , lov and released 08/01/2011   IgG kappa dx 2002 8% plasma cells in bone marrow; no lesions on bone X-rays;  . Nocturia   . OA (osteoarthritis)   . OSA on CPAP    per study 01-30-2004  severe osa , AHI 51.6/hr  . PAD (peripheral artery disease) ([place])    05-21-2006  left renal artery stenosis, s/p balloon angioplasty and stenting;  last duplex 02/ 2012  normal , arteries patent  . S/P coronary artery stent placement    2000--  BMS x1  to OM;   2007-- DES x1  to OM proximal to previous stent  . Secondary hyperparathyroidism of renal origin ([place])   . Urgency of urination   . Wears glasses    Past Surgical History:  Procedure Laterality Date  . CARDIOVASCULAR STRESS TEST  2010   per dr bensimhon epic note dated 04-18-2016  normal  . [place]   PTCA to OM  . CORONARY ANGIOPLASTY WITH STENT PLACEMENT  2000   PCI and BMS x1 OM  . CORONARY ANGIOPLASTY WITH STENT PLACEMENT  12-18-2005  dr Claiborne Billings   DES x1 to proximal to previously placed stent in OM  . HERNIA REPAIR    . INGUINAL HERNIA REPAIR Bilateral 10/30/2017   Procedure: LAPAROSCOPIC  BILATERAL INGUINAL HERNIA REPAIR  AND LEFT [place];  Surgeon: Michael Boston, MD;  Location: [place];  Service: General;  Laterality: Bilateral;  . INSERTION OF MESH Bilateral 10/30/2017   Procedure: INSERTION OF MESH;  Surgeon: Michael Boston, MD;  Location: [place];  Service: General;  Laterality: Bilateral;  . RENAL ANGIOPLASTY Left 05-21-2006   dr berry   and stenting  . TOTAL KNEE ARTHROPLASTY Left 07-22-2005   dr Shellia Carwin  [facility]  . TRANSTHORACIC ECHOCARDIOGRAM  04/18/2016   ef 19-41%, grade 1 diastolic  dysfunction/  mild to moderate AR/  mild MR   Family History  Problem Relation Age of Onset  . Heart disease Mother   . Healthy Father   . Heart disease Sister   . Heart disease Sister   . Cancer Sister        type unknown  . Hypertension Sister   . Diabetes Brother   . Diabetes Brother   . Diabetes Other   . Coronary artery disease Other   . Cancer Other    Social History   Socioeconomic History  . Marital status: Widowed    Spouse name: Not on file  . Number of children: 1  . Years of education: Not on file  . Highest  education level: Not on file  Occupational History  . Occupation: retired   Tobacco Use  . Smoking status: Current Some Day Smoker    Packs/day: 0.25    Types: Cigarettes  . Smokeless tobacco: Never Used  . Tobacco comment: 10-29-2017  per pt was smoking 1pp2d, stopped June 2019  Vaping Use  . Vaping Use: Never used  Substance and Sexual Activity  . Alcohol use: Not Currently  . Drug use: No  . Sexual activity: Not on file  Other Topics Concern  . Not on file  Social History Narrative   Widowed 02/2012   Lives along   [place] smoking 2004   Alcohol none   Exercise works in [place] Strain: Not on file  Food Insecurity: Not on file  Transportation Needs: Not on file  Physical Activity: Not on file  Stress: Not on file  Social Connections: Not on file    Tobacco Counseling Ready to quit: Not Answered Counseling given: Not Answered Comment: 10-29-2017  per pt was smoking 1pp2d, stopped June 2019   Clinical Intake:  Pre-visit preparation completed: No  Pain : 0-10 Pain Score: 9  Pain Type: Chronic pain Pain Location: Knee Pain Orientation: Left Pain Radiating Towards: no Pain Descriptors / Indicators: Aching Pain Onset: Other (comment) (10 -12 years ago) Pain Frequency: Intermittent Pain Relieving Factors: Rubbing alcohol,warm compression Effect of Pain on  Daily Activities: No  Pain Relieving Factors: Rubbing alcohol,warm compression  BMI - recorded: 23.48 Nutritional Status: BMI of 19-24  Normal Nutritional Risks: None Diabetes: Yes CBG done?: No (90's) CBG resulted in Enter/ Edit results?: No Did pt. bring in CBG monitor from home?: Yes  How often do you need to have someone help you when you read instructions, pamphlets, or other written materials from your doctor or pharmacy?: 1 - Never What is the last grade level you completed in school?: 1 college  Diabetic?Yes   Interpreter Needed?: No      Activities of Daily Living In your present state of health, do you have any difficulty performing the following activities: 06/01/2020  Hearing? Y  Vision? Y  Comment wears eye glasses  Difficulty concentrating or making decisions? N  Walking or climbing stairs? Y  Comment walks with cane  Dressing or bathing? N  Doing errands, shopping? Y  Comment son assist with Copywriter, advertising and eating ? N  Using the Toilet? N  In the past six months, have you accidently leaked urine? N  Do you have problems with loss of bowel control? N  Managing your Finances? N  Housekeeping or managing your Housekeeping? N  Some recent data might be hidden    Patient Care Team: Gayland Curry, DO as PCP - General (Geriatric Medicine) Bensimhon, Shaune Pascal, MD as Consulting Physician (Cardiology) Penninger, Ria Comment, [place] as Physician Assistant (Nephrology) Donato Heinz, MD as Consulting Physician (Nephrology) Michael Boston, MD as Consulting Physician (General Surgery) Ralene Bathe, MD as Consulting Physician (Ophthalmology)  Indicate any recent Medical Services you may have received from other than [facility] providers in the past year (date may be approximate).     Assessment:   This is a routine wellness examination for Emilliano.  Hearing/Vision screen  Hearing Screening   125Hz  250Hz  500Hz  1000Hz  2000Hz  3000Hz  4000Hz  6000Hz   8000Hz   Right ear:           Left ear:  Comments: No Hearing Concerns.   Vision Screening Comments: No Vision Concerns. Patient wears prescription glasses. Last eye exam was 2021  Dietary issues and exercise activities discussed: Current Exercise Habits: Home exercise routine, Type of exercise: Other - see comments;walking (push up x 20), Time (Minutes): 15, Frequency (Times/Week): 3, Weekly Exercise (Minutes/Week): 45, Intensity: Moderate, Exercise limited by: None identified  Goals    . <enter goal here>     Starting 05/09/16, I will maintain [place]rant lifestyle, while working on making it better.     . Exercise 3x per week (30 min per time)     Patient would like to exercise more than he does now      Depression Screen PHQ 2/9 Scores 06/01/2020 09/05/2019 05/31/2019 06/28/2018 05/26/2018 01/25/2018 09/14/2017  PHQ - 2 Score 0 0 0 0 0 0 0    Fall Risk Fall Risk  06/01/2020 02/16/2020 11/10/2019 09/05/2019 05/31/2019  Falls in the past year? 0 0 0 1 0  Number falls in past yr: 0 0 - 0 0  Injury with Fall? 0 0 - 0 0  Risk Factor Category  - - - - -  Risk for fall due to : - - - - -  Follow up - - - - -    FALL [place]:  Any stairs in or around the home? No  If so, are there any without handrails? No  Home free of loose throw rugs in walkways, pet beds, electrical cords, etc? No  Adequate lighting in your home to reduce risk of falls? Yes   ASSISTIVE DEVICES UTILIZED TO PREVENT FALLS:  Life alert? Yes  Use of a cane, walker or w/c? Yes  Grab bars in the bathroom? No  Shower chair or bench in shower? No  Elevated toilet seat or a handicapped toilet? Yes   TIMED UP AND GO:  Was the test performed? No .  Length of time to ambulate 10 feet: N/A sec.   Gait slow and steady with assistive device  Cognitive Function: MMSE - Mini Mental State Exam 05/26/2018 05/25/2017 05/09/2016 04/26/2015  Orientation to time 5 5 5 5   Orientation to Place 5 5 5  5   Registration 3 3 3 3   Attention/ Calculation 2 4 3 4   Recall 3 1 3 3   Language- name 2 objects 2 2 2 2   Language- repeat 1 1 1 1   Language- follow 3 step command 3 3 3 3   Language- read & follow direction 1 1 1 1   Write a sentence 1 1 1 1   Copy design 1 1 1 1   Total score 27 27 28 29      6CIT Screen 06/01/2020 05/31/2019  What Year? 0 points 0 points  What month? 0 points 0 points  What time? 0 points 0 points  Count back from 20 4 points 0 points  Months in reverse 4 points 4 points  Repeat phrase 8 points 10 points  Total Score 16 14    Immunizations Immunization History  Administered Date(s) Administered  . Fluad Quad(high Dose 65+) 01/06/2019, 02/16/2020  . Influenza, High Dose Seasonal PF 01/08/2017, 01/25/2018  . Influenza,inj,Quad PF,6+ Mos 12/27/2012, 03/06/2014, 04/02/2015, 11/30/2015  . PFIZER(Purple Top)SARS-COV-2 Vaccination 06/16/2019, 07/08/2019, 02/16/2020  . Pneumococcal Conjugate-13 09/22/2014  . Pneumococcal Polysaccharide-23 12/27/2012  . Tdap 04/15/2011    TDAP status: Up to date  Flu Vaccine status: Up to date  Pneumococcal vaccine status: Up to date  Covid-19 vaccine status: Completed  vaccines  Qualifies for Shingles Vaccine? Yes   Zostavax completed No   Shingrix Completed?: No.    Education has been provided regarding the importance of this vaccine. Patient has been advised to call insurance company to determine out of pocket expense if they have not yet received this vaccine. Advised may also receive vaccine at local pharmacy or Health Dept. Verbalized acceptance and understanding.  Screening Tests Health Maintenance  Topic Date Due  . OPHTHALMOLOGY EXAM  09/16/2019  . HEMOGLOBIN A1C  03/04/2020  . COVID-19 Vaccine (4 - Booster for Pfizer series) 08/15/2020  . FOOT EXAM  02/15/2021  . URINE MICROALBUMIN  02/15/2021  . TETANUS/TDAP  04/14/2021  . INFLUENZA VACCINE  Completed  . PNA vac Low Risk Adult  Completed    Health  Maintenance  Health Maintenance Due  Topic Date Due  . OPHTHALMOLOGY EXAM  09/16/2019  . HEMOGLOBIN A1C  03/04/2020    Colorectal cancer screening: No longer required.   Lung Cancer Screening: (Low Dose CT Chest recommended if Age 50-80 years, 30 pack-year currently smoking OR have quit w/in 15years.) does not qualify.   Lung Cancer Screening Referral: No   Additional Screening:  Hepatitis C Screening: does not qualify; Completed No   Vision Screening: Recommended annual ophthalmology exams for early detection of glaucoma and other disorders of the eye. Is the patient up to date with their annual eye exam?  No  Who is the provider or what is the name of the office in which the patient attends annual eye exams? Has upcoming appointment  If pt is not established with a provider, would they like to be referred to a provider to establish care? No .   Dental Screening: Recommended annual dental exams for proper oral hygiene  Community Resource Referral / Chronic Care Management: CRR required this visit?  No   CCM required this visit?  No      Plan:   - Shingrix vaccine   - Annual eye exam due with Ophthalmology has appointment march,2022   I have personally reviewed and noted the following in the patient's chart:   . Medical and social history . Use of alcohol, tobacco or illicit drugs  . Current medications and supplements . Functional ability and status . Nutritional status . Physical activity . Advanced directives . List of other physicians . Hospitalizations, surgeries, and ER visits in previous 12 months . Vitals . Screenings to include cognitive, depression, and falls . Referrals and appointments  In addition, I have reviewed and discussed with patient certain preventive protocols, quality metrics, and best practice recommendations. A written personalized care plan for preventive services as well as general preventive health recommendations were provided to  patient.    Sandrea Hughs, NP   06/01/2020   Nurse Notes: - Declined shingrix vaccine due to cost. - Has upcoming appointment with Ophthalmologist in [phone]

## 2020-06-01 NOTE — Telephone Encounter (Signed)
[name], auzenne are scheduled for a virtual visit with your provider today.    Just as we do with appointments in the office, we must obtain your consent to participate.  Your consent will be active for this visit and any virtual visit you may have with one of our providers in the next 365 days.    If you have a MyChart account, I can also send a copy of this consent to you electronically.  All virtual visits are billed to your insurance company just like a traditional visit in the office.  As this is a virtual visit, video technology does not allow for your provider to perform a traditional examination.  This may limit your provider's ability to fully assess your condition.  If your provider identifies any concerns that need to be evaluated in person or the need to arrange testing such as labs, EKG, etc, we will make arrangements to do so.    Although advances in technology are sophisticated, we cannot ensure that it will always work on either your end or our end.  If the connection with a video visit is poor, we may have to switch to a telephone visit.  With either a video or telephone visit, we are not always able to ensure that we have a secure connection.   I need to obtain your verbal consent now.   Are you willing to proceed with your visit today?   Kevin Schwartz has provided verbal consent on 06/01/2020 for a virtual visit (video or telephone).   Kevin Schwartz, [place] 06/01/2020  11:00 AM

## 2020-06-01 NOTE — Patient Instructions (Signed)
[name] , Thank you for taking time to come for your Medicare Wellness Visit. I appreciate your ongoing commitment to your health goals. Please review the following plan we discussed and let me know if I can assist you in the future.   Screening recommendations/referrals: Colonoscopy: N/A  Recommended yearly ophthalmology/optometry visit for glaucoma screening and checkup Recommended yearly dental visit for hygiene and checkup  Vaccinations: Influenza vaccine: Up to date  Pneumococcal vaccine : Up to date  Tdap vaccine : Up to date  Shingles vaccine: declined due to cost   Advanced directives: yes   Conditions/risks identified: Advance age male > 39 yrs,Male Gender,Type 2 DM,Hypertension,Dyslipidemia and Hx of smoking   Next appointment: 1 year   Preventive Care 7 Years and Older, Male Preventive care refers to lifestyle choices and visits with your health care provider that can promote health and wellness. What does preventive care include?  A yearly physical exam. This is also called an annual well check.  Dental exams once or twice a year.  Routine eye exams. Ask your health care provider how often you should have your eyes checked.  Personal lifestyle choices, including:  Daily care of your teeth and gums.  Regular physical activity.  Eating a healthy diet.  Avoiding tobacco and drug use.  Limiting alcohol use.  Practicing safe sex.  Taking low doses of aspirin every day.  Taking vitamin and mineral supplements as recommended by your health care provider. What happens during an annual well check? The services and screenings done by your health care provider during your annual well check will depend on your age, overall health, lifestyle risk factors, and family history of disease. Counseling  Your health care provider may ask you questions about your:  Alcohol use.  Tobacco use.  Drug use.  Emotional well-being.  Home and relationship  well-being.  Sexual activity.  Eating habits.  History of falls.  Memory and ability to understand (cognition).  Work and work Statistician. Screening  You may have the following tests or measurements:  Height, weight, and BMI.  Blood pressure.  Lipid and cholesterol levels. These may be checked every 5 years, or more frequently if you are over 87 years old.  Skin check.  Lung cancer screening. You may have this screening every year starting at age 3 if you have a 30-pack-year history of smoking and currently smoke or have quit within the past 15 years.  Fecal occult blood test (FOBT) of the stool. You may have this test every year starting at age 29.  Flexible sigmoidoscopy or colonoscopy. You may have a sigmoidoscopy every 5 years or a colonoscopy every 10 years starting at age 100.  Prostate cancer screening. Recommendations will vary depending on your family history and other risks.  Hepatitis C blood test.  Hepatitis B blood test.  Sexually transmitted disease (STD) testing.  Diabetes screening. This is done by checking your blood sugar (glucose) after you have not eaten for a while (fasting). You may have this done every 1-3 years.  Abdominal aortic aneurysm (AAA) screening. You may need this if you are a current or former smoker.  Osteoporosis. You may be screened starting at age 73 if you are at high risk. Talk with your health care provider about your test results, treatment options, and if necessary, the need for more tests. Vaccines  Your health care provider may recommend certain vaccines, such as:  Influenza vaccine. This is recommended every year.  Tetanus, diphtheria, and acellular pertussis (  Tdap, Td) vaccine. You may need a Td booster every 10 years.  Zoster vaccine. You may need this after age 7.  Pneumococcal 13-valent conjugate (PCV13) vaccine. One dose is recommended after age 44.  Pneumococcal polysaccharide (PPSV23) vaccine. One dose is  recommended after age 40. Talk to your health care provider about which screenings and vaccines you need and how often you need them. This information is not intended to replace advice given to you by your health care provider. Make sure you discuss any questions you have with your health care provider. Document Released: 04/27/2015 Document Revised: 12/19/2015 Document Reviewed: 01/30/2015 Elsevier Interactive Patient Education  2017 [employer] Prevention in the Home Falls can cause injuries. They can happen to people of all ages. There are many things you can do to make your home safe and to help prevent falls. What can I do on the outside of my home?  Regularly fix the edges of walkways and driveways and fix any cracks.  Remove anything that might make you trip as you walk through a door, such as a raised step or threshold.  Trim any bushes or trees on the path to your home.  Use bright outdoor lighting.  Clear any walking paths of anything that might make someone trip, such as rocks or tools.  Regularly check to see if handrails are loose or broken. Make sure that both sides of any steps have handrails.  Any raised decks and porches should have guardrails on the edges.  Have any leaves, snow, or ice cleared regularly.  Use sand or salt on walking paths during winter.  Clean up any spills in your garage right away. This includes oil or grease spills. What can I do in the bathroom?  Use night lights.  Install grab bars by the toilet and in the tub and shower. Do not use towel bars as grab bars.  Use non-skid mats or decals in the tub or shower.  If you need to sit down in the shower, use a plastic, non-slip stool.  Keep the floor dry. Clean up any water that spills on the floor as soon as it happens.  Remove soap buildup in the tub or shower regularly.  Attach bath mats securely with double-sided non-slip rug tape.  Do not have throw rugs and other things on  the floor that can make you trip. What can I do in the bedroom?  Use night lights.  Make sure that you have a light by your bed that is easy to reach.  Do not use any sheets or blankets that are too big for your bed. They should not hang down onto the floor.  Have a firm chair that has side arms. You can use this for support while you get dressed.  Do not have throw rugs and other things on the floor that can make you trip. What can I do in the kitchen?  Clean up any spills right away.  Avoid walking on wet floors.  Keep items that you use a lot in easy-to-reach places.  If you need to reach something above you, use a strong step stool that has a grab bar.  Keep electrical cords out of the way.  Do not use floor polish or wax that makes floors slippery. If you must use wax, use non-skid floor wax.  Do not have throw rugs and other things on the floor that can make you trip. What can I do with my stairs?  Do  not leave any items on the stairs.  Make sure that there are handrails on both sides of the stairs and use them. Fix handrails that are broken or loose. Make sure that handrails are as long as the stairways.  Check any carpeting to make sure that it is firmly attached to the stairs. Fix any carpet that is loose or worn.  Avoid having throw rugs at the top or bottom of the stairs. If you do have throw rugs, attach them to the floor with carpet tape.  Make sure that you have a light switch at the top of the stairs and the bottom of the stairs. If you do not have them, ask someone to add them for you. What else can I do to help prevent falls?  Wear shoes that:  Do not have high heels.  Have rubber bottoms.  Are comfortable and fit you well.  Are closed at the toe. Do not wear sandals.  If you use a stepladder:  Make sure that it is fully opened. Do not climb a closed stepladder.  Make sure that both sides of the stepladder are locked into place.  Ask someone to  hold it for you, if possible.  Clearly mark and make sure that you can see:  Any grab bars or handrails.  First and last steps.  Where the edge of each step is.  Use tools that help you move around (mobility aids) if they are needed. These include:  Canes.  Walkers.  Scooters.  Crutches.  Turn on the lights when you go into a dark area. Replace any light bulbs as soon as they burn out.  Set up your furniture so you have a clear path. Avoid moving your furniture around.  If any of your floors are uneven, fix them.  If there are any pets around you, be aware of where they are.  Review your medicines with your doctor. Some medicines can make you feel dizzy. This can increase your chance of falling. Ask your doctor what other things that you can do to help prevent falls. This information is not intended to replace advice given to you by your health care provider. Make sure you discuss any questions you have with your health care provider. Document Released: 01/25/2009 Document Revised: 09/06/2015 Document Reviewed: 05/05/2014 Elsevier Interactive Patient Education  2017 [employer].

## 2020-06-04 ENCOUNTER — Encounter: Payer: Self-pay | Admitting: Internal Medicine

## 2020-06-06 ENCOUNTER — Other Ambulatory Visit: Payer: Self-pay | Admitting: Internal Medicine

## 2020-06-06 NOTE — Telephone Encounter (Signed)
Prednisone is not on active medication list

## 2020-06-13 ENCOUNTER — Telehealth: Payer: Self-pay

## 2020-06-13 ENCOUNTER — Other Ambulatory Visit: Payer: Self-pay

## 2020-06-13 ENCOUNTER — Other Ambulatory Visit: Payer: Medicare Other

## 2020-06-13 DIAGNOSIS — N183 Chronic kidney disease, stage 3 unspecified: Secondary | ICD-10-CM | POA: Diagnosis not present

## 2020-06-13 DIAGNOSIS — D472 Monoclonal gammopathy: Secondary | ICD-10-CM | POA: Diagnosis not present

## 2020-06-13 DIAGNOSIS — E1122 Type 2 diabetes mellitus with diabetic chronic kidney disease: Secondary | ICD-10-CM | POA: Diagnosis not present

## 2020-06-13 DIAGNOSIS — I10 Essential (primary) hypertension: Secondary | ICD-10-CM | POA: Diagnosis not present

## 2020-06-13 NOTE — Telephone Encounter (Signed)
Patient came into office and handed me papers from [facility] access [place]. Patient states that he needs papers filled out by PCP Reed, Tiffany L, DO . Papers given to Clinical Intake. Papers placed into review and sign folder for review. Patient signed Charge for Letters Forms, Medical Records paper.

## 2020-06-14 LAB — CBC WITH DIFFERENTIAL/PLATELET
Absolute Monocytes: 689 cells/uL (ref 200–950)
Basophils Absolute: 28 cells/uL (ref 0–200)
Basophils Relative: 0.4 %
Eosinophils Absolute: 369 cells/uL (ref 15–500)
Eosinophils Relative: 5.2 %
HCT: 37 % — ABNORMAL LOW (ref 38.5–50.0)
Hemoglobin: 12.3 g/dL — ABNORMAL LOW (ref 13.2–17.1)
Lymphs Abs: 1335 cells/uL (ref 850–3900)
MCH: 30.4 pg (ref 27.0–33.0)
MCHC: 33.2 g/dL (ref 32.0–36.0)
MCV: 91.4 fL (ref 80.0–100.0)
MPV: 10.3 fL (ref 7.5–12.5)
Monocytes Relative: 9.7 %
Neutro Abs: 4679 cells/uL (ref 1500–7800)
Neutrophils Relative %: 65.9 %
Platelets: 403 10*3/uL — ABNORMAL HIGH (ref 140–400)
RBC: 4.05 10*6/uL — ABNORMAL LOW (ref 4.20–5.80)
RDW: 14.1 % (ref 11.0–15.0)
Total Lymphocyte: 18.8 %
WBC: 7.1 10*3/uL (ref 3.8–10.8)

## 2020-06-14 LAB — BASIC METABOLIC PANEL
BUN/Creatinine Ratio: 23 (calc) — ABNORMAL HIGH (ref 6–22)
BUN: 27 mg/dL — ABNORMAL HIGH (ref 7–25)
CO2: 23 mmol/L (ref 20–32)
Calcium: 9.3 mg/dL (ref 8.6–10.3)
Chloride: 106 mmol/L (ref 98–110)
Creat: 1.2 mg/dL — ABNORMAL HIGH (ref 0.70–1.11)
Glucose, Bld: 100 mg/dL — ABNORMAL HIGH (ref 65–99)
Potassium: 4.6 mmol/L (ref 3.5–5.3)
Sodium: 138 mmol/L (ref 135–146)

## 2020-06-14 LAB — HEMOGLOBIN A1C
Hgb A1c MFr Bld: 5.3 % of total Hgb (ref <5.7)
Mean Plasma Glucose: 105 mg/dL
eAG (mmol/L): 5.8 mmol/L

## 2020-06-14 NOTE — Telephone Encounter (Signed)
My portion of the form was completed today

## 2020-06-14 NOTE — Progress Notes (Signed)
Kidney function and anemia improved. Platelets slightly high

## 2020-06-15 NOTE — Telephone Encounter (Signed)
Paperwork completed by Dr. Mariea Clonts.  Copy made and sent for scanning.  Patient notified to pick up, stated he will pick up Monday.  Original left up front for pick up.  No Charge per Dr. Mariea Clonts.

## 2020-06-18 ENCOUNTER — Ambulatory Visit: Payer: Medicare Other | Admitting: Internal Medicine

## 2020-06-18 ENCOUNTER — Encounter: Payer: Self-pay | Admitting: Internal Medicine

## 2020-06-18 ENCOUNTER — Other Ambulatory Visit: Payer: Self-pay

## 2020-06-18 VITALS — BP 150/80 | HR 79 | Temp 97.5°F | Ht 68.0 in | Wt 166.4 lb

## 2020-06-18 DIAGNOSIS — I10 Essential (primary) hypertension: Secondary | ICD-10-CM

## 2020-06-18 DIAGNOSIS — I2510 Atherosclerotic heart disease of native coronary artery without angina pectoris: Secondary | ICD-10-CM

## 2020-06-18 DIAGNOSIS — D472 Monoclonal gammopathy: Secondary | ICD-10-CM | POA: Diagnosis not present

## 2020-06-18 DIAGNOSIS — E1169 Type 2 diabetes mellitus with other specified complication: Secondary | ICD-10-CM | POA: Diagnosis not present

## 2020-06-18 DIAGNOSIS — E1122 Type 2 diabetes mellitus with diabetic chronic kidney disease: Secondary | ICD-10-CM

## 2020-06-18 DIAGNOSIS — M175 Other unilateral secondary osteoarthritis of knee: Secondary | ICD-10-CM

## 2020-06-18 DIAGNOSIS — N183 Chronic kidney disease, stage 3 unspecified: Secondary | ICD-10-CM | POA: Diagnosis not present

## 2020-06-18 DIAGNOSIS — I739 Peripheral vascular disease, unspecified: Secondary | ICD-10-CM

## 2020-06-18 DIAGNOSIS — E782 Mixed hyperlipidemia: Secondary | ICD-10-CM

## 2020-06-18 MED ORDER — DICLOFENAC SODIUM 1 % EX GEL: 4.0000 g | Freq: Four times a day (QID) | CUTANEOUS | 3 refills | Status: DC

## 2020-06-18 NOTE — Patient Instructions (Signed)
Stop naproxen Start voltaren gel for your left knee.

## 2020-06-18 NOTE — Progress Notes (Signed)
Location:  [facility] clinic Provider:  Joslynne Klatt L. Mariea Clonts, D.O., C.M.D.  Goals of Care:  Advanced Directives 06/18/2020  Does Patient Have a Medical Advance Directive? No  Type of Advance Directive -  Does patient want to make changes to medical advance directive? -  Copy of [place] in Chart? -  Would patient like information on creating a medical advance directive? -     Chief Complaint  Patient presents with  . Medical Management of Chronic Issues    4 month follow up.    HPI: Patient is a 87 y.o. male seen today for medical management of chronic diseases.    I did the [employer] bus form.    Left knee pain--it left him know it was going to rain.  He's been taking naprosyn.  Pain in the knee goes and comes.  He has not used tylenol.  Discussed use of voltaren gel.   Brace is working well.  Using cane.  No falls.  Says it's nice where he's at now.  He's getting enough to eat.    Past Medical History:  Diagnosis Date  . Anemia due to chronic kidney disease   . Bilateral inguinal hernia   . CAD (coronary artery disease) cardiologist-  dr bensimhon   PTCA of OM in 1998, [place] OM in 2000, Cath 9/07 LM ok LAD ok. LCX 95% in OM prior to previous stent RCA. nondominant normal EF normal. [place] DES to OM 2007 (PLACED PROXIAMAL TO PREVIOUS STENT)  . Chronic kidney disease, stage III (moderate) [facility])    nephrologist-  dr detarding  . Depression   . Diabetes mellitus type 2, diet-controlled ([place])    followed by pcp,  last A1c 5.2 on 09-10-2017 in epic  . Gout, unspecified    10-29-2017  per pt stable  . HLD (hyperlipidemia)   . HTN (hypertension)   . MGUS (monoclonal gammopathy of unknown significance) previously followed by dr Beryle Beams , lov and released 08/01/2011   IgG kappa dx 2002 8% plasma cells in bone marrow; no lesions on bone X-rays;  . Nocturia   . OA (osteoarthritis)   . OSA on CPAP    per study 01-30-2004  severe osa , AHI 51.6/hr  . PAD (peripheral artery  disease) ([place])    05-21-2006  left renal artery stenosis, s/p balloon angioplasty and stenting;  last duplex 02/ 2012  normal , arteries patent  . S/P coronary artery stent placement    2000--  BMS x1  to OM;   2007-- DES x1  to OM proximal to previous stent  . Secondary hyperparathyroidism of renal origin ([place])   . Urgency of urination   . Wears glasses     Past Surgical History:  Procedure Laterality Date  . CARDIOVASCULAR STRESS TEST  2010   per dr bensimhon epic note dated 04-18-2016  normal  . [place]   PTCA to OM  . CORONARY ANGIOPLASTY WITH STENT PLACEMENT  2000   PCI and BMS x1 OM  . CORONARY ANGIOPLASTY WITH STENT PLACEMENT  12-18-2005  dr Claiborne Billings   DES x1 to proximal to previously placed stent in OM  . HERNIA REPAIR    . INGUINAL HERNIA REPAIR Bilateral 10/30/2017   Procedure: LAPAROSCOPIC  BILATERAL INGUINAL HERNIA REPAIR  AND LEFT [place];  Surgeon: Michael Boston, MD;  Location: [facility];  Service: General;  Laterality: Bilateral;  . INSERTION OF MESH Bilateral 10/30/2017   Procedure: INSERTION  OF MESH;  Surgeon: Michael Boston, MD;  Location: [facility];  Service: General;  Laterality: Bilateral;  . RENAL ANGIOPLASTY Left 05-21-2006   dr berry   and stenting  . TOTAL KNEE ARTHROPLASTY Left 07-22-2005   dr Shellia Carwin  [name]  . TRANSTHORACIC ECHOCARDIOGRAM  04/18/2016   ef 42-70%, grade 1 diastolic dysfunction/  mild to moderate AR/  mild MR    Allergies  Allergen Reactions  . Lisinopril Swelling    angioedema  . Losartan Potassium Swelling    Angioedema   . Crestor [Rosuvastatin] Swelling    angioedema    Outpatient Encounter Medications as of 06/18/2020  Medication Sig  . ACCU-CHEK SOFTCLIX LANCETS lancets Use to test blood sugar daily. Dx:E11.8  . Alcohol Swabs (B-D SINGLE USE SWABS REGULAR) PADS Use with testing of blood sugar. Dx:E11.8  . allopurinol (ZYLOPRIM) 100 MG tablet Take 1 tablet (100 mg  total) by mouth daily.  [name] amLODipine (NORVASC) 5 MG tablet Take 1 tablet (5 mg total) by mouth daily.  [name] aspirin EC 81 MG tablet Take 81 mg by mouth daily.  [name] atorvastatin (LIPITOR) 80 MG tablet Take 1 tablet (80 mg total) by mouth daily.  . citalopram (CELEXA) 20 MG tablet Take 1 tablet (20 mg total) by mouth daily.  [name] glucose blood (ACCU-CHEK AVIVA PLUS) test strip Use to check blood sugar daily. Dx:E11.8  . lidocaine (LIDODERM) 5 % Place 1 patch onto the skin daily. Remove & Discard patch within 12 hours or as directed by MD  . metoprolol tartrate (LOPRESSOR) 50 MG tablet Take 1 tablet (50 mg total) by mouth 2 (two) times daily.  . naproxen (NAPROSYN) 375 MG tablet Take 1 tablet (375 mg total) by mouth 2 (two) times daily.  . nitroGLYCERIN (NITROSTAT) 0.4 MG SL tablet DISSOLVE 1 TABLET UNDER THE TONGUE EVERY 5 MINUTES AS  NEEDED FOR CHEST PAIN. MAX  OF 3 TABLETS IN 15 MINUTES. CALL 911 IF PAIN PERSISTS.  [name] ULTRA-THIN II MINI PEN NEEDLE 31G X 5 MM MISC   . [DISCONTINUED] acetaminophen (TYLENOL) 325 MG tablet Take 1 tablet (325 mg total) by mouth every 8 (eight) hours as needed.   No facility-administered encounter medications on file as of 06/18/2020.    Review of Systems:  Review of Systems  Constitutional: Negative for chills, fever and malaise/fatigue.  HENT: Negative for congestion and sore throat.   Eyes: Negative for blurred vision.  Respiratory: Negative for cough and shortness of breath.   Cardiovascular: Negative for chest pain, palpitations and leg swelling.  Gastrointestinal: Negative for abdominal pain, blood in stool, constipation, diarrhea and melena.  Genitourinary: Negative for dysuria.  Musculoskeletal: Positive for joint pain. Negative for falls.  Neurological: Negative for dizziness and loss of consciousness.  Endo/Heme/Allergies: Bruises/bleeds easily.  Psychiatric/Behavioral: Positive for memory loss. Negative for depression. The patient is not nervous/anxious and  does not have insomnia.     Health Maintenance  Topic Date Due  . OPHTHALMOLOGY EXAM  06/25/2020 (Originally 09/16/2019)  . COVID-19 Vaccine (4 - Booster for Pfizer series) 08/15/2020  . HEMOGLOBIN A1C  12/14/2020  . FOOT EXAM  02/15/2021  . URINE MICROALBUMIN  02/15/2021  . TETANUS/TDAP  04/14/2021  . INFLUENZA VACCINE  Completed  . PNA vac Low Risk Adult  Completed  . HPV VACCINES  Aged Out    Physical Exam: Vitals:   06/18/20 1001  BP: (!) 150/80  Pulse: 79  Temp: (!) 97.5 F (36.4 C)  SpO2: 97%  Weight:  166 lb 6.4 oz (75.5 kg)  Height: 5\' 8"  (1.727 m)   Body mass index is 25.3 kg/m. Physical Exam Vitals reviewed.  Constitutional:      Appearance: Normal appearance.     Comments: disheveled  HENT:     Head: Normocephalic and atraumatic.  Eyes:     Comments: glasses  Cardiovascular:     Rate and Rhythm: Normal rate and regular rhythm.     Pulses: Normal pulses.     Heart sounds: Normal heart sounds.  Pulmonary:     Effort: Pulmonary effort is normal.     Breath sounds: Normal breath sounds. No rales.  Abdominal:     General: Bowel sounds are normal.     Palpations: Abdomen is soft.  Musculoskeletal:        General: Normal range of motion.     Right lower leg: No edema.     Left lower leg: No edema.     Comments: No localized tenderness, using cane to ambulate  Neurological:     General: No focal deficit present.     Mental Status: He is alert and oriented to person, place, and time.  Psychiatric:        Mood and Affect: Mood normal.        Behavior: Behavior normal.     Labs reviewed: Basic Metabolic Panel: Recent Labs    09/02/19 0954 06/13/20 0933  NA 140 138  K 4.1 4.6  CL 108 106  CO2 23 23  GLUCOSE 119* 100*  BUN 16 27*  CREATININE 1.42* 1.20*  CALCIUM 9.1 9.3  TSH 2.48  --    Liver Function Tests: Recent Labs    09/02/19 0954  AST 18  ALT 11  BILITOT 0.5  PROT 7.1   No results for input(s): LIPASE, AMYLASE in the last 8760  hours. No results for input(s): AMMONIA in the last 8760 hours. CBC: Recent Labs    09/02/19 0954 06/13/20 0933  WBC 6.6 7.1  NEUTROABS 4,831 4,679  HGB 12.0* 12.3*  HCT 36.2* 37.0*  MCV 93.1 91.4  PLT 373 403*   Lipid Panel: No results for input(s): CHOL, HDL, LDLCALC, TRIG, CHOLHDL, LDLDIRECT in the last 8760 hours. Lab Results  Component Value Date   HGBA1C 5.3 06/13/2020    Procedures since last visit: No results found.  Assessment/Plan 1. Other secondary osteoarthritis of left knee -d/c aleve/naproxen -start voltaren gel instead  2. Controlled type 2 diabetes mellitus with stage 3 chronic kidney disease, without long-term current use of insulin (H[facility]) -has long been well-controlled  3. MGUS (monoclonal gammopathy of unknown significance) -cont annual spep, upep  4. Mixed hyperlipidemia due to type 2 diabetes mellitus ([place]) -has been controlled, remains on high dose lipitor  5. Essential hypertension, benign -bp at goal typically, has had nsaids so may be reason for this, change to topicals  6. Atherosclerosis of native coronary artery of native heart without angina pectoris -cont current regimen, no recent symptoms  7. Peripheral vascular disease, unspecified ([place]) -no related complaints or skin breakdown -cont diabetic socks and shoes   Labs/tests ordered:   Lab Orders  No laboratory test(s) ordered today   Next appt:  4 mos med mgt Dr. Sabra Heck   Kaydence Menard L. Kris No, D.O. [facility] [address], [place] [address] Cell Phone (Mon-Fri 8am-5pm):  [phone] On Call:  [phone] & follow prompts after 5pm & weekends Office Phone:  [phone] Office Fax:  [phone]   

## 2020-07-27 ENCOUNTER — Emergency Department (HOSPITAL_COMMUNITY): Payer: Medicare Other

## 2020-07-27 ENCOUNTER — Encounter (HOSPITAL_COMMUNITY): Payer: Self-pay

## 2020-07-27 ENCOUNTER — Emergency Department (HOSPITAL_COMMUNITY)
Admission: EM | Admit: 2020-07-27 | Discharge: 2020-07-27 | Disposition: A | Discharge status: Home / Self Care | Payer: Medicare Other | Attending: Emergency Medicine | Admitting: Emergency Medicine

## 2020-07-27 DIAGNOSIS — Z7982 Long term (current) use of aspirin: Secondary | ICD-10-CM | POA: Diagnosis not present

## 2020-07-27 DIAGNOSIS — I129 Hypertensive chronic kidney disease with stage 1 through stage 4 chronic kidney disease, or unspecified chronic kidney disease: Secondary | ICD-10-CM | POA: Diagnosis not present

## 2020-07-27 DIAGNOSIS — N183 Chronic kidney disease, stage 3 unspecified: Secondary | ICD-10-CM | POA: Diagnosis not present

## 2020-07-27 DIAGNOSIS — H9201 Otalgia, right ear: Secondary | ICD-10-CM | POA: Diagnosis present

## 2020-07-27 DIAGNOSIS — I2510 Atherosclerotic heart disease of native coronary artery without angina pectoris: Secondary | ICD-10-CM | POA: Insufficient documentation

## 2020-07-27 DIAGNOSIS — M26601 Right temporomandibular joint disorder, unspecified: Secondary | ICD-10-CM | POA: Diagnosis not present

## 2020-07-27 DIAGNOSIS — F17210 Nicotine dependence, cigarettes, uncomplicated: Secondary | ICD-10-CM | POA: Insufficient documentation

## 2020-07-27 DIAGNOSIS — E1122 Type 2 diabetes mellitus with diabetic chronic kidney disease: Secondary | ICD-10-CM | POA: Insufficient documentation

## 2020-07-27 DIAGNOSIS — Z96652 Presence of left artificial knee joint: Secondary | ICD-10-CM | POA: Diagnosis not present

## 2020-07-27 DIAGNOSIS — Z79899 Other long term (current) drug therapy: Secondary | ICD-10-CM | POA: Insufficient documentation

## 2020-07-27 DIAGNOSIS — M26621 Arthralgia of right temporomandibular joint: Secondary | ICD-10-CM

## 2020-07-27 LAB — COMPREHENSIVE METABOLIC PANEL
ALT: 26 U/L (ref 0–44)
AST: 31 U/L (ref 15–41)
Albumin: 3.8 g/dL (ref 3.5–5.0)
Alkaline Phosphatase: 75 U/L (ref 38–126)
Anion gap: 11 (ref 5–15)
BUN: 19 mg/dL (ref 8–23)
CO2: 20 mmol/L — ABNORMAL LOW (ref 22–32)
Calcium: 8.9 mg/dL (ref 8.9–10.3)
Chloride: 105 mmol/L (ref 98–111)
Creatinine, Ser: 1.54 mg/dL — ABNORMAL HIGH (ref 0.61–1.24)
GFR, Estimated: 43 mL/min — ABNORMAL LOW (ref >60)
Glucose, Bld: 102 mg/dL — ABNORMAL HIGH (ref 70–99)
Potassium: 4.3 mmol/L (ref 3.5–5.1)
Sodium: 136 mmol/L (ref 135–145)
Total Bilirubin: 1.9 mg/dL — ABNORMAL HIGH (ref 0.3–1.2)
Total Protein: 7.7 g/dL (ref 6.5–8.1)

## 2020-07-27 LAB — CBC WITH DIFFERENTIAL/PLATELET
Abs Immature Granulocytes: 0.09 10*3/uL — ABNORMAL HIGH (ref 0.00–0.07)
Basophils Absolute: 0 10*3/uL (ref 0.0–0.1)
Basophils Relative: 0 %
Eosinophils Absolute: 0 10*3/uL (ref 0.0–0.5)
Eosinophils Relative: 0 %
HCT: 39.8 % (ref 39.0–52.0)
Hemoglobin: 13 g/dL (ref 13.0–17.0)
Immature Granulocytes: 1 %
Lymphocytes Relative: 4 %
Lymphs Abs: 0.5 10*3/uL — ABNORMAL LOW (ref 0.7–4.0)
MCH: 31 pg (ref 26.0–34.0)
MCHC: 32.7 g/dL (ref 30.0–36.0)
MCV: 94.8 fL (ref 80.0–100.0)
Monocytes Absolute: 0.8 10*3/uL (ref 0.1–1.0)
Monocytes Relative: 6 %
Neutro Abs: 12 10*3/uL — ABNORMAL HIGH (ref 1.7–7.7)
Neutrophils Relative %: 89 %
Platelets: 339 10*3/uL (ref 150–400)
RBC: 4.2 MIL/uL — ABNORMAL LOW (ref 4.22–5.81)
RDW: 14.3 % (ref 11.5–15.5)
WBC: 13.5 10*3/uL — ABNORMAL HIGH (ref 4.0–10.5)
nRBC: 0 % (ref 0.0–0.2)

## 2020-07-27 MED ORDER — ACETAMINOPHEN 500 MG PO TABS
1000.0000 mg | ORAL_TABLET | Freq: Once | ORAL | Status: AC
  Administered 2020-07-27: 1000 mg via ORAL
  Filled 2020-07-27: qty 2

## 2020-07-27 MED ORDER — IOHEXOL 300 MG/ML  SOLN
60.0000 mL | Freq: Once | INTRAMUSCULAR | Status: AC | PRN
  Administered 2020-07-27: 60 mL via INTRAVENOUS

## 2020-07-27 NOTE — ED Provider Notes (Signed)
[facility] Provider Note   CSN: [id number] Arrival date & time: 07/27/20  1053     History Chief Complaint  Patient presents with  . Facial Pain    Kevin Schwartz is a 87 y.o. male with PMHx Diabetes, CAD, CKD stage III, HTN, HLD who presents to the ED today with complaints of gradual onset, constant, sharp, right ear pain that began this morning and woke him up out of his sleep. He also complains of a "twitching" sensation to the right side of his face. He has not taken anything for pain prior to arrival. No other complaints at this time including fevers, chills, ear drainage, tinnitus, loss of hearing, or any other associated symptoms.   The history is provided by the patient and medical records.       Past Medical History:  Diagnosis Date  . Anemia due to chronic kidney disease   . Bilateral inguinal hernia   . CAD (coronary artery disease) cardiologist-  dr bensimhon   PTCA of OM in 1998, [place] OM in 2000, Cath 9/07 LM ok LAD ok. LCX 95% in OM prior to previous stent RCA. nondominant normal EF normal. [place] DES to OM 2007 (PLACED PROXIAMAL TO PREVIOUS STENT)  . Chronic kidney disease, stage III (moderate) [facility])    nephrologist-  dr detarding  . Depression   . Diabetes mellitus type 2, diet-controlled ([place])    followed by pcp,  last A1c 5.2 on 09-10-2017 in epic  . Gout, unspecified    10-29-2017  per pt stable  . HLD (hyperlipidemia)   . HTN (hypertension)   . MGUS (monoclonal gammopathy of unknown significance) previously followed by dr Beryle Beams , lov and released 08/01/2011   IgG kappa dx 2002 8% plasma cells in bone marrow; no lesions on bone X-rays;  . Nocturia   . OA (osteoarthritis)   . OSA on CPAP    per study 01-30-2004  severe osa , AHI 51.6/hr  . PAD (peripheral artery disease) ([place])    05-21-2006  left renal artery stenosis, s/p balloon angioplasty and stenting;  last duplex 02/ 2012  normal , arteries patent   . S/P coronary artery stent placement    2000--  BMS x1  to OM;   2007-- DES x1  to OM proximal to previous stent  . Secondary hyperparathyroidism of renal origin ([place])   . Urgency of urination   . Wears glasses     Patient Active Problem List   Diagnosis Date Noted  . Grieving 02/16/2020  . Bilateral inguinal hernia s/p lap hernia repair w mesh 10/30/2017 10/30/2017  . Left femoral hernia s/p lap hernia repair w mesh 10/30/2017 10/30/2017  . Loss of weight 01/21/2016  . Heme positive stool 01/21/2016  . Heart murmur 08/18/2013  . Chronic kidney disease, stage III (moderate) ([place]) 07/14/2013  . Hyperlipidemia LDL goal < 100 07/14/2013  . Essential hypertension, benign 07/14/2013  . Type II or unspecified type diabetes mellitus with renal manifestations, not stated as uncontrolled(250.40) 07/14/2013  . Insomnia 07/14/2013  . Angioedema of lips 12/26/2012  . MGUS (monoclonal gammopathy of unknown significance) 08/01/2011  . Gout of right wrist 08/01/2011  . S/P coronary artery stent placement 08/01/2011  . DM type 2 causing CKD stage 2 ([place]) 08/01/2011  . Bradycardia 10/15/2010  . Mixed hyperlipidemia due to type 2 diabetes mellitus ([place]) 05/01/2010  . HYPERTENSION, UNSPECIFIED 05/01/2010  . CAD, NATIVE VESSEL 05/01/2010  . PVD 05/01/2010  Past Surgical History:  Procedure Laterality Date  . CARDIOVASCULAR STRESS TEST  2010   per dr bensimhon epic note dated 04-18-2016  normal  . [place]   PTCA to OM  . CORONARY ANGIOPLASTY WITH STENT PLACEMENT  2000   PCI and BMS x1 OM  . CORONARY ANGIOPLASTY WITH STENT PLACEMENT  12-18-2005  dr Claiborne Billings   DES x1 to proximal to previously placed stent in OM  . HERNIA REPAIR    . INGUINAL HERNIA REPAIR Bilateral 10/30/2017   Procedure: LAPAROSCOPIC  BILATERAL INGUINAL HERNIA REPAIR  AND LEFT [place];  Surgeon: Michael Boston, MD;  Location: [facility];  Service: General;  Laterality: Bilateral;   . INSERTION OF MESH Bilateral 10/30/2017   Procedure: INSERTION OF MESH;  Surgeon: Michael Boston, MD;  Location: [facility];  Service: General;  Laterality: Bilateral;  . RENAL ANGIOPLASTY Left 05-21-2006   dr berry   and stenting  . TOTAL KNEE ARTHROPLASTY Left 07-22-2005   dr Shellia Carwin  [facility]  . TRANSTHORACIC ECHOCARDIOGRAM  04/18/2016   ef 69-48%, grade 1 diastolic dysfunction/  mild to moderate AR/  mild MR       Family History  Problem Relation Age of Onset  . Heart disease Mother   . Healthy Father   . Heart disease Sister   . Heart disease Sister   . Cancer Sister        type unknown  . Hypertension Sister   . Diabetes Brother   . Diabetes Brother   . Diabetes Other   . Coronary artery disease Other   . Cancer Other     Social History   Tobacco Use  . Smoking status: Current Some Day Smoker    Packs/day: 0.25    Types: Cigarettes  . Smokeless tobacco: Never Used  . Tobacco comment: 10-29-2017  per pt was smoking 1pp2d, stopped June 2019  Vaping Use  . Vaping Use: Never used  Substance Use Topics  . Alcohol use: Not Currently  . Drug use: No    Home Medications Prior to Admission medications   Medication Sig Start Date End Date Taking? Authorizing Provider  allopurinol (ZYLOPRIM) 100 MG tablet Take 1 tablet (100 mg total) by mouth daily. 09/05/19  Yes Reed, Tiffany L, DO  amLODipine (NORVASC) 5 MG tablet Take 1 tablet (5 mg total) by mouth daily. 09/05/19  Yes Reed, Tiffany L, DO  aspirin EC 81 MG tablet Take 81 mg by mouth daily.   Yes [provider]  atorvastatin (LIPITOR) 80 MG tablet Take 1 tablet (80 mg total) by mouth daily. 09/05/19  Yes Reed, Tiffany L, DO  citalopram (CELEXA) 20 MG tablet Take 1 tablet (20 mg total) by mouth daily. 09/05/19  Yes Reed, Tiffany L, DO  diclofenac Sodium (VOLTAREN) 1 % GEL Apply 4 g topically 4 (four) times daily. Patient taking differently: Apply 4 g topically See admin instructions. Apply 4 grams  to affected areas four times a day 06/18/20  Yes Reed, Tiffany L, DO  lidocaine (LIDODERM) 5 % Place 1 patch onto the skin daily. Remove & Discard patch within 12 hours or as directed by MD Patient taking differently: Place 1 patch onto the skin See admin instructions. Apply 1 patch to the affected site once a day as needed for pain and remove & discard patch within 12 hours or as directed by MD 04/01/20  Yes Maudie Flakes, MD  metoprolol tartrate (LOPRESSOR) 50 MG tablet  Take 1 tablet (50 mg total) by mouth 2 (two) times daily. Patient taking differently: Take 100 mg by mouth in the morning. 09/05/19  Yes Reed, Tiffany L, DO  nitroGLYCERIN (NITROSTAT) 0.4 MG SL tablet DISSOLVE 1 TABLET UNDER THE TONGUE EVERY 5 MINUTES AS  NEEDED FOR CHEST PAIN. MAX  OF 3 TABLETS IN 15 MINUTES. CALL 911 IF PAIN PERSISTS. Patient taking differently: Place 0.4 mg under the tongue every 5 (five) minutes x 3 doses as needed for chest pain (AND CALL 9-1-1, IF PAIN PERSISTS). 06/06/20  Yes Reed, Tiffany L, DO  ACCU-CHEK SOFTCLIX LANCETS lancets Use to test blood sugar daily. Dx:E11.8 03/25/17   Reed, Tiffany L, DO  Alcohol Swabs (B-D SINGLE USE SWABS REGULAR) PADS Use with testing of blood sugar. Dx:E11.8 03/25/17   Reed, Tiffany L, DO  glucose blood (ACCU-CHEK AVIVA PLUS) test strip Use to check blood sugar daily. Dx:E11.8 09/05/19   Reed, Tiffany L, DO  ULTRA-THIN II MINI PEN NEEDLE 31G X 5 MM MISC  08/29/14   [provider]    Allergies    Lisinopril, Losartan potassium, Crestor [rosuvastatin], and Tape  Review of Systems   Review of Systems  Constitutional: Negative for chills and fever.  HENT: Positive for ear pain. Negative for ear discharge and hearing loss.        + facial pain  All other systems reviewed and are negative.   Physical Exam Updated Vital Signs BP (!) 181/98 (BP Location: Right Arm)   Pulse (!) 103   Temp 98 F (36.7 C) (Oral)   Resp (!) 23   Ht 5\' 8"  (1.727 m)   Wt 74.8 kg    SpO2 98%   BMI 25.09 kg/m   Physical Exam Vitals and nursing note reviewed.  Constitutional:      Appearance: He is not ill-appearing or diaphoretic.  HENT:     Head: Normocephalic and atraumatic.      Comments: + TTP anterior to the right ear along the trigeminal nerve.    Right Ear: Ear canal and external ear normal.     Left Ear: Tympanic membrane normal.     Ears:     Comments: Very mild erythema to right TM. EAC clear. Mild TTP with palpation of the tragus and pulling of the pinna on right side.  Eyes:     Conjunctiva/sclera: Conjunctivae normal.  Cardiovascular:     Rate and Rhythm: Normal rate and regular rhythm.  Pulmonary:     Effort: Pulmonary effort is normal.     Breath sounds: Normal breath sounds. No wheezing, rhonchi or rales.  Abdominal:     Palpations: Abdomen is soft.     Tenderness: There is no abdominal tenderness.  Musculoskeletal:     Cervical back: Neck supple.  Skin:    General: Skin is warm and dry.  Neurological:     General: No focal deficit present.     Mental Status: He is alert and oriented to person, place, and time.     Cranial Nerves: No cranial nerve deficit.     Motor: No weakness.     ED Results / Procedures / Treatments   Labs (all labs ordered are listed, but only abnormal results are displayed) Labs Reviewed  CBC WITH DIFFERENTIAL/PLATELET - Abnormal; Notable for the following components:      Result Value   WBC 13.5 (*)    RBC 4.20 (*)    Neutro Abs 12.0 (*)    Lymphs Abs 0.5 (*)  Abs Immature Granulocytes 0.09 (*)    All other components within normal limits  COMPREHENSIVE METABOLIC PANEL - Abnormal; Notable for the following components:   CO2 20 (*)    Glucose, Bld 102 (*)    Creatinine, Ser 1.54 (*)    Total Bilirubin 1.9 (*)    GFR, Estimated 43 (*)    All other components within normal limits    EKG None  Radiology CT Temporal Bones W Contrast  Result Date: 07/27/2020 CLINICAL DATA:  Mastoiditis; right ear  pain with leukocytosis. Question mastoiditis. EXAM: CT TEMPORAL BONES WITH CONTRAST TECHNIQUE: Axial and coronal plane CT imaging of the petrous temporal bones was performed with thin-collimation image reconstruction after intravenous contrast administration. Multiplanar CT image reconstructions were also generated. CONTRAST:  56mL OMNIPAQUE IOHEXOL 300 MG/ML  SOLN COMPARISON:  Head CT 09/30/2010 FINDINGS: RIGHT: Small amount of debris within the external auditory canal. The middle ear cavity is well-aerated. The ossicles are unremarkable. The inner ear structures, internal auditory and facial nerve canals are normal. Moderate right mastoid effusion without coalescent osseous erosion. LEFT: Small amount of debris within the external auditory canal. The middle ear cavity is well-aerated. The ossicles are unremarkable. The inner ear structures, internal auditory and facial nerve canals are normal. Large left mastoid effusion without appreciable coalescent osseous erosion. OTHER: Prominent right TMJ osteoarthrosis. Generalized atrophy of the visualized brain with cerebral white matter chronic small vessel ischemic disease. No acute orbital finding. Trace scattered paranasal sinus mucosal thickening at the imaged levels. IMPRESSION: Right temporal bone: 1. Moderate right mastoid effusion without coalescent osseous erosion. Correlate clinically for signs and symptoms of mastoiditis. 2. Small amount of debris within the right external auditory canal. 3. Otherwise unremarkable CT appearance of the right temporal bone. Left temporal bone: 1. Large left mastoid effusion without appreciable coalescent osseous erosion. Correlate clinically for signs and symptoms of mastoiditis. 2. Small amount of debris within the left external auditory canal. 3. Otherwise unremarkable CT appearance of the left temporal bone. Also of note, there is prominent right TMJ osteoarthrosis. Electronically Signed   By: Kellie Simmering DO   On: 07/27/2020  17:21    Procedures Procedures   Medications Ordered in ED Medications  acetaminophen (TYLENOL) tablet 1,000 mg (1,000 mg Oral Given 07/27/20 1223)  iohexol (OMNIPAQUE) 300 MG/ML solution 60 mL (60 mLs Intravenous Contrast Given 07/27/20 1639)    ED Course  I have reviewed the triage vital signs and the nursing notes.  Pertinent labs & imaging results that were available during my care of the patient were reviewed by me and considered in my medical decision making (see chart for details).    MDM Rules/Calculators/A&P                          87 year old male with complaint of atraumatic right ear pain as well as twitching to the right side of his face that woke him up out of his sleep this morning.  On arrival to the ED patient is mildly tachycardic at 103, suspect this is secondary to pain.  He did not take anything for pain prior to arrival and is requesting something for pain.  Blood pressure is elevated at 181/98.  He took all his medications this morning, will continue to monitor.  Patient does admit that he was concerned that he was having a stroke, there is no focal neuro deficits on exam today.  He had does have some mild erythema to  the right TM however not consistent with obvious otitis media at this time.  He has some tenderness with palpation of the tragus as well as pulling of the pinna however external auditory canal is clear without signs of Titus externa.  He does have positive tenderness palpation along the anterior aspect of the right ear where the trigeminal nerve sits.  With palpation this does cause contraction of the facial muscles on the right side.  Given his age we will plan for lab work at this time to assess for electrolyte derangements including hypocalcemia. Question trigeminal neuralgia causing symptoms? May benefit from a course of gabapentin and PCP follow up.    CBC with leukocytosis of 13.5 CMP with stable creatinine 1.54 and GFR 43  Discussed case with  attending physician Dr. Joya Gaskins who recommends CT Temporal bone for further evaluation given leukocytosis and distribution of pain. If negative will discharge home with PCP follow up.   CT scan: IMPRESSION:  Right temporal bone:    1. Moderate right mastoid effusion without coalescent osseous  erosion. Correlate clinically for signs and symptoms of mastoiditis.  2. Small amount of debris within the right external auditory canal.  3. Otherwise unremarkable CT appearance of the right temporal bone.    Left temporal bone:    1. Large left mastoid effusion without appreciable coalescent  osseous erosion. Correlate clinically for signs and symptoms of  mastoiditis.  2. Small amount of debris within the left external auditory canal.  3. Otherwise unremarkable CT appearance of the left temporal bone.    Also of note, there is prominent right TMJ osteoarthrosis.   Discussed case with ENT Dr. Constance Holster given right TMJ findings and pain; suspect this is likely cause more so than trigeminal neuralgia. Recommends pain control and dentistry follow up and if no improvement/no findings with dentists can follow up with ENT. Appreciate his involvement.   On reevaluation pt resting comfortably. Reports Tylenol has improved pain. Will discharge with instructions to continue Tylenol PRN for pain. Pt in agreement with plan and stable for discharge home.   This note was prepared using Dragon voice recognition software and may include unintentional dictation errors due to the inherent limitations of voice recognition software.  Final Clinical Impression(s) / ED Diagnoses Final diagnoses:  TMJ tenderness, right    Rx / DC Orders ED Discharge Orders    None       Discharge Instructions     Your CT scan showed findings consistent with TMJ (temporomandibular joint syndrome). Please take 1,000 mg Tylenol (2 double strength OTC tablets) every 8 hours as needed for pain. You can also apply ice to the area  for pain relief.   It is recommended that you follow up with a dentist for further evaluation as dental issues can cause similar pain. Attached is a list of dental resources in the area.   If you are cleared by a dentist/there are no findings to cause your pain then you can follow up with Dr. Constance Holster ENT for further evaluation.   You can also follow up with your PCP regarding your ED visit today  Return to the ED for any worsening symptoms       Eustaquio Maize, PA-C 07/27/20 1808    Arnaldo Natal, MD 07/28/20 [phone]

## 2020-07-27 NOTE — Discharge Instructions (Addendum)
Your CT scan showed findings consistent with TMJ (temporomandibular joint syndrome). Please take 1,000 mg Tylenol (2 double strength OTC tablets) every 8 hours as needed for pain. You can also apply ice to the area for pain relief.   It is recommended that you follow up with a dentist for further evaluation as dental issues can cause similar pain. Attached is a list of dental resources in the area.   If you are cleared by a dentist/there are no findings to cause your pain then you can follow up with Dr. Constance Holster ENT for further evaluation.   You can also follow up with your PCP regarding your ED visit today  Return to the ED for any worsening symptoms

## 2020-07-27 NOTE — ED Triage Notes (Signed)
Right ear pain since this AM. Reports tremors to mouth and cheek pain. Alert and oriented x 4.

## 2020-07-31 ENCOUNTER — Inpatient Hospital Stay (HOSPITAL_COMMUNITY)
Admission: EM | Admit: 2020-07-31 | Discharge: 2020-08-03 | DRG: 246 | Disposition: A | Discharge status: Home Health | Payer: Medicare Other | Attending: Internal Medicine | Admitting: Internal Medicine

## 2020-07-31 ENCOUNTER — Emergency Department (HOSPITAL_COMMUNITY): Payer: Medicare Other

## 2020-07-31 ENCOUNTER — Other Ambulatory Visit: Payer: Self-pay

## 2020-07-31 ENCOUNTER — Encounter (HOSPITAL_COMMUNITY): Payer: Self-pay

## 2020-07-31 DIAGNOSIS — G4733 Obstructive sleep apnea (adult) (pediatric): Secondary | ICD-10-CM | POA: Diagnosis present

## 2020-07-31 DIAGNOSIS — N2581 Secondary hyperparathyroidism of renal origin: Secondary | ICD-10-CM | POA: Diagnosis present

## 2020-07-31 DIAGNOSIS — I2510 Atherosclerotic heart disease of native coronary artery without angina pectoris: Secondary | ICD-10-CM | POA: Diagnosis present

## 2020-07-31 DIAGNOSIS — I252 Old myocardial infarction: Secondary | ICD-10-CM | POA: Diagnosis not present

## 2020-07-31 DIAGNOSIS — Z7982 Long term (current) use of aspirin: Secondary | ICD-10-CM | POA: Diagnosis not present

## 2020-07-31 DIAGNOSIS — D62 Acute posthemorrhagic anemia: Secondary | ICD-10-CM | POA: Diagnosis not present

## 2020-07-31 DIAGNOSIS — M25512 Pain in left shoulder: Secondary | ICD-10-CM

## 2020-07-31 DIAGNOSIS — E782 Mixed hyperlipidemia: Secondary | ICD-10-CM | POA: Diagnosis present

## 2020-07-31 DIAGNOSIS — Z833 Family history of diabetes mellitus: Secondary | ICD-10-CM

## 2020-07-31 DIAGNOSIS — E1169 Type 2 diabetes mellitus with other specified complication: Secondary | ICD-10-CM | POA: Diagnosis present

## 2020-07-31 DIAGNOSIS — T82855A Stenosis of coronary artery stent, initial encounter: Secondary | ICD-10-CM | POA: Diagnosis present

## 2020-07-31 DIAGNOSIS — E1122 Type 2 diabetes mellitus with diabetic chronic kidney disease: Secondary | ICD-10-CM | POA: Diagnosis present

## 2020-07-31 DIAGNOSIS — F32A Depression, unspecified: Secondary | ICD-10-CM | POA: Diagnosis present

## 2020-07-31 DIAGNOSIS — I21A9 Other myocardial infarction type: Secondary | ICD-10-CM | POA: Diagnosis present

## 2020-07-31 DIAGNOSIS — E1151 Type 2 diabetes mellitus with diabetic peripheral angiopathy without gangrene: Secondary | ICD-10-CM | POA: Diagnosis present

## 2020-07-31 DIAGNOSIS — Z96652 Presence of left artificial knee joint: Secondary | ICD-10-CM | POA: Diagnosis present

## 2020-07-31 DIAGNOSIS — I129 Hypertensive chronic kidney disease with stage 1 through stage 4 chronic kidney disease, or unspecified chronic kidney disease: Secondary | ICD-10-CM | POA: Diagnosis present

## 2020-07-31 DIAGNOSIS — I214 Non-ST elevation (NSTEMI) myocardial infarction: Secondary | ICD-10-CM | POA: Diagnosis present

## 2020-07-31 DIAGNOSIS — D72828 Other elevated white blood cell count: Secondary | ICD-10-CM | POA: Diagnosis present

## 2020-07-31 DIAGNOSIS — Z79899 Other long term (current) drug therapy: Secondary | ICD-10-CM

## 2020-07-31 DIAGNOSIS — Y831 Surgical operation with implant of artificial internal device as the cause of abnormal reaction of the patient, or of later complication, without mention of misadventure at the time of the procedure: Secondary | ICD-10-CM | POA: Diagnosis present

## 2020-07-31 DIAGNOSIS — Z8249 Family history of ischemic heart disease and other diseases of the circulatory system: Secondary | ICD-10-CM | POA: Diagnosis not present

## 2020-07-31 DIAGNOSIS — M109 Gout, unspecified: Secondary | ICD-10-CM | POA: Diagnosis present

## 2020-07-31 DIAGNOSIS — Z20822 Contact with and (suspected) exposure to covid-19: Secondary | ICD-10-CM | POA: Diagnosis present

## 2020-07-31 DIAGNOSIS — N1831 Chronic kidney disease, stage 3a: Secondary | ICD-10-CM | POA: Diagnosis present

## 2020-07-31 DIAGNOSIS — R778 Other specified abnormalities of plasma proteins: Secondary | ICD-10-CM | POA: Diagnosis not present

## 2020-07-31 DIAGNOSIS — Z955 Presence of coronary angioplasty implant and graft: Secondary | ICD-10-CM

## 2020-07-31 DIAGNOSIS — I441 Atrioventricular block, second degree: Secondary | ICD-10-CM | POA: Diagnosis present

## 2020-07-31 DIAGNOSIS — F419 Anxiety disorder, unspecified: Secondary | ICD-10-CM | POA: Diagnosis present

## 2020-07-31 DIAGNOSIS — F17210 Nicotine dependence, cigarettes, uncomplicated: Secondary | ICD-10-CM | POA: Diagnosis present

## 2020-07-31 DIAGNOSIS — M25519 Pain in unspecified shoulder: Secondary | ICD-10-CM

## 2020-07-31 LAB — PROTIME-INR
INR: 1.1 (ref 0.8–1.2)
Prothrombin Time: 14.6 seconds (ref 11.4–15.2)

## 2020-07-31 LAB — COMPREHENSIVE METABOLIC PANEL
ALT: 22 U/L (ref 0–44)
AST: 40 U/L (ref 15–41)
Albumin: 4.4 g/dL (ref 3.5–5.0)
Alkaline Phosphatase: 79 U/L (ref 38–126)
Anion gap: 12 (ref 5–15)
BUN: 19 mg/dL (ref 8–23)
CO2: 21 mmol/L — ABNORMAL LOW (ref 22–32)
Calcium: 9.5 mg/dL (ref 8.9–10.3)
Chloride: 105 mmol/L (ref 98–111)
Creatinine, Ser: 1.3 mg/dL — ABNORMAL HIGH (ref 0.61–1.24)
GFR, Estimated: 53 mL/min — ABNORMAL LOW (ref >60)
Glucose, Bld: 91 mg/dL (ref 70–99)
Potassium: 4.2 mmol/L (ref 3.5–5.1)
Sodium: 138 mmol/L (ref 135–145)
Total Bilirubin: 1.1 mg/dL (ref 0.3–1.2)
Total Protein: 9.5 g/dL — ABNORMAL HIGH (ref 6.5–8.1)

## 2020-07-31 LAB — TROPONIN I (HIGH SENSITIVITY)
Troponin I (High Sensitivity): 315 ng/L (ref <18)
Troponin I (High Sensitivity): 316 ng/L (ref <18)
Troponin I (High Sensitivity): 256 ng/L (ref <18)

## 2020-07-31 LAB — CBC WITH DIFFERENTIAL/PLATELET
Abs Immature Granulocytes: 0.07 10*3/uL (ref 0.00–0.07)
Basophils Absolute: 0 10*3/uL (ref 0.0–0.1)
Basophils Relative: 0 %
Eosinophils Absolute: 0 10*3/uL (ref 0.0–0.5)
Eosinophils Relative: 0 %
HCT: 42.3 % (ref 39.0–52.0)
Hemoglobin: 13.8 g/dL (ref 13.0–17.0)
Immature Granulocytes: 1 %
Lymphocytes Relative: 7 %
Lymphs Abs: 0.9 10*3/uL (ref 0.7–4.0)
MCH: 30.9 pg (ref 26.0–34.0)
MCHC: 32.6 g/dL (ref 30.0–36.0)
MCV: 94.6 fL (ref 80.0–100.0)
Monocytes Absolute: 1.1 10*3/uL — ABNORMAL HIGH (ref 0.1–1.0)
Monocytes Relative: 8 %
Neutro Abs: 11.1 10*3/uL — ABNORMAL HIGH (ref 1.7–7.7)
Neutrophils Relative %: 84 %
Platelets: 421 10*3/uL — ABNORMAL HIGH (ref 150–400)
RBC: 4.47 MIL/uL (ref 4.22–5.81)
RDW: 13.6 % (ref 11.5–15.5)
WBC: 13.3 10*3/uL — ABNORMAL HIGH (ref 4.0–10.5)
nRBC: 0 % (ref 0.0–0.2)

## 2020-07-31 LAB — URINALYSIS, ROUTINE W REFLEX MICROSCOPIC
Bacteria, UA: NONE SEEN
Bilirubin Urine: NEGATIVE
Glucose, UA: NEGATIVE mg/dL
Ketones, ur: NEGATIVE mg/dL
Leukocytes,Ua: NEGATIVE
Nitrite: NEGATIVE
Protein, ur: >=300 mg/dL — AB
Specific Gravity, Urine: 1.018 (ref 1.005–1.030)
pH: 5 (ref 5.0–8.0)

## 2020-07-31 LAB — GLUCOSE, CAPILLARY: Glucose-Capillary: 101 mg/dL — ABNORMAL HIGH (ref 70–99)

## 2020-07-31 LAB — RAPID URINE DRUG SCREEN, HOSP PERFORMED
Amphetamines: NOT DETECTED
Barbiturates: NOT DETECTED
Benzodiazepines: NOT DETECTED
Cocaine: NOT DETECTED
Opiates: NOT DETECTED
Tetrahydrocannabinol: NOT DETECTED

## 2020-07-31 LAB — RESP PANEL BY RT-PCR (FLU A&B, COVID) ARPGX2
Influenza A by PCR: NEGATIVE
Influenza B by PCR: NEGATIVE
SARS Coronavirus 2 by RT PCR: NEGATIVE

## 2020-07-31 LAB — APTT: aPTT: 28 seconds (ref 24–36)

## 2020-07-31 LAB — CBG MONITORING, ED
Glucose-Capillary: 90 mg/dL (ref 70–99)
Glucose-Capillary: 76 mg/dL (ref 70–99)
Glucose-Capillary: 121 mg/dL — ABNORMAL HIGH (ref 70–99)

## 2020-07-31 LAB — HEPARIN LEVEL (UNFRACTIONATED): Heparin Unfractionated: 0.27 IU/mL — ABNORMAL LOW (ref 0.30–0.70)

## 2020-07-31 MED ORDER — AMLODIPINE BESYLATE 5 MG PO TABS
5.0000 mg | ORAL_TABLET | Freq: Every day | ORAL | Status: DC
  Administered 2020-07-31 – 2020-08-03 (×4): 5 mg via ORAL
  Filled 2020-07-31 (×4): qty 1

## 2020-07-31 MED ORDER — ASPIRIN 325 MG PO TABS
325.0000 mg | ORAL_TABLET | Freq: Once | ORAL | Status: AC
  Administered 2020-07-31: 325 mg via ORAL
  Filled 2020-07-31: qty 1

## 2020-07-31 MED ORDER — HEPARIN (PORCINE) 25000 UT/250ML-% IV SOLN
1200.0000 [IU]/h | INTRAVENOUS | Status: DC
  Administered 2020-07-31: 900 [IU]/h via INTRAVENOUS
  Filled 2020-07-31: qty 250

## 2020-07-31 MED ORDER — ZOLPIDEM TARTRATE 5 MG PO TABS: 5.0000 mg | ORAL_TABLET | Freq: Every evening | ORAL | Status: DC | PRN

## 2020-07-31 MED ORDER — ALLOPURINOL 100 MG PO TABS
100.0000 mg | ORAL_TABLET | Freq: Every day | ORAL | Status: DC
  Administered 2020-08-01 – 2020-08-03 (×3): 100 mg via ORAL
  Filled 2020-07-31 (×3): qty 1

## 2020-07-31 MED ORDER — HEPARIN BOLUS VIA INFUSION
4000.0000 [IU] | Freq: Once | INTRAVENOUS | Status: AC
  Administered 2020-07-31: 4000 [IU] via INTRAVENOUS
  Filled 2020-07-31: qty 4000

## 2020-07-31 MED ORDER — ATORVASTATIN CALCIUM 80 MG PO TABS
80.0000 mg | ORAL_TABLET | Freq: Every day | ORAL | Status: DC
  Administered 2020-07-31 – 2020-08-03 (×4): 80 mg via ORAL
  Filled 2020-07-31: qty 2
  Filled 2020-07-31 (×2): qty 1
  Filled 2020-07-31: qty 4

## 2020-07-31 MED ORDER — METOPROLOL TARTRATE 50 MG PO TABS
50.0000 mg | ORAL_TABLET | Freq: Two times a day (BID) | ORAL | Status: DC
  Administered 2020-07-31: 50 mg via ORAL
  Filled 2020-07-31: qty 2

## 2020-07-31 MED ORDER — NITROGLYCERIN 0.4 MG SL SUBL: 0.4000 mg | SUBLINGUAL_TABLET | SUBLINGUAL | Status: DC | PRN

## 2020-07-31 MED ORDER — ASPIRIN EC 81 MG PO TBEC
81.0000 mg | DELAYED_RELEASE_TABLET | Freq: Every day | ORAL | Status: DC
  Administered 2020-08-01: 81 mg via ORAL
  Filled 2020-07-31: qty 1

## 2020-07-31 MED ORDER — ONDANSETRON HCL 4 MG/2ML IJ SOLN: 4.0000 mg | Freq: Four times a day (QID) | INTRAMUSCULAR | Status: DC | PRN

## 2020-07-31 MED ORDER — ALPRAZOLAM 0.25 MG PO TABS: 0.2500 mg | ORAL_TABLET | Freq: Two times a day (BID) | ORAL | Status: DC | PRN

## 2020-07-31 MED ORDER — SODIUM CHLORIDE 0.9 % IV SOLN: INTRAVENOUS | Status: DC

## 2020-07-31 MED ORDER — CITALOPRAM HYDROBROMIDE 20 MG PO TABS
20.0000 mg | ORAL_TABLET | Freq: Every day | ORAL | Status: DC
  Administered 2020-07-31 – 2020-08-03 (×4): 20 mg via ORAL
  Filled 2020-07-31 (×3): qty 1
  Filled 2020-07-31: qty 2

## 2020-07-31 MED ORDER — ACETAMINOPHEN 325 MG PO TABS: 650.0000 mg | ORAL_TABLET | ORAL | Status: DC | PRN

## 2020-07-31 NOTE — Progress Notes (Signed)
[place] for heparin Indication: chest pain/ACS  Allergies  Allergen Reactions  . Lisinopril Swelling and Other (See Comments)    Angioedema   . Losartan Potassium Swelling and Other (See Comments)    Angioedema   . Crestor [Rosuvastatin] Swelling and Other (See Comments)    Angioedema   . Tape Other (See Comments)    Irritates the skin    Patient Measurements: weight = 75kg, height = 68 inches   Heparin Dosing Weight: 75kg  Vital Signs: Temp: 98.1 F (36.7 C) (04/19 1315) Temp Source: Oral (04/19 1315) BP: 143/66 (04/19 1730) Pulse Rate: 76 (04/19 1730)  Labs: Recent Labs    07/31/20 1540  HGB 13.8  HCT 42.3  PLT 421*  CREATININE 1.30*  TROPONINIHS 315*    Estimated Creatinine Clearance: 38.7 mL/min (A) (by C-G formula based on SCr of 1.3 mg/dL (H)).  Assessment: Patient is an 87 y.o M presented to the ED on 4/19 with c/o left shoulder pain and s/p fall. Head CT showed "small right forehead scalp hematoma." Troponin was found to be elevated.  Pharmacy has been consulted to start heparin for suspected ACS.    Goal of Therapy:  Heparin level 0.3-0.7 units/ml Monitor platelets by anticoagulation protocol: Yes   Plan:  - heparin 4000 units IV x1, then 900 units/hr - check 8 hr heparin level - monitor for s/sx bleeding.  Lynelle Doctor 07/31/2020,6:38 PM

## 2020-07-31 NOTE — ED Notes (Signed)
Called lab at the request of Gwyndolyn Saxon, [place] because patient's troponin and CMP have yet to result and was sent at 1540.  Per lab, they are working on it and it takes awhile. Gwyndolyn Saxon, [place] updated.

## 2020-07-31 NOTE — ED Notes (Signed)
Waiting for patient to come back from CT to do EKG

## 2020-07-31 NOTE — ED Notes (Signed)
Darl Householder, EDP made aware patient CBG is now 33.  Per Darl Householder, EDP, give patient juice and food.  Patient is more awake at this time so he was given 2 apple juices and a sandwich.

## 2020-07-31 NOTE — H&P (Addendum)
History and Physical   [facility] [place] @ [place] Admission History and Physical [employer], D.O.    Patient Name: Kevin Schwartz MR#: [id number] Date of Birth: 11-15-1930 Date of Admission: 07/31/2020  Referring MD/NP/PA: Dr. Estill Bakes Primary Care Physician: [name], MD  Chief Complaint: No chief complaint on file.   HPI: Kevin Schwartz is a 87 y.o. male with a known history of hypertension, hyperlipidemia, coronary artery disease status post stenting, DM2, anemia of chronic disease presents to the emergency department for evaluation of left shoulder pain.  Patient was in a usual state of health until this morning when he fell, states that he felt weak, dizzy and lightheaded, fell landing on his right side.  Denies head trauma or loss of consciousness.  He reports sudden onset of left sided shoulder pain worse with movement, described as aching, localized nonradiating.  Denies any chest pain nausea, vomiting, shortness of breath  Patient denies fevers/chills, weakness, dizziness, chest pain, shortness of breath, N/V/C/D, abdominal pain, dysuria/frequency, changes in mental status.    Otherwise there has been no change in status. Patient has been taking medication as prescribed and there has been no recent change in medication or diet.  No recent antibiotics.  There has been no recent illness, hospitalizations, travel or sick contacts.    EMS/ED Course: Patient received aspirin, statin, heparin drip. Medical admission has been requested for further management of non-ST elevation MI.  Review of Systems:  CONSTITUTIONAL: Positive dizziness, weakness . no fever/chills, fatigue,  weight gain/loss, headache. EYES: No blurry or double vision. ENT: No tinnitus, postnasal drip, redness or soreness of the oropharynx. RESPIRATORY: No cough, dyspnea, wheeze.  No hemoptysis.  CARDIOVASCULAR: No chest pain, palpitations, syncope, orthopnea. No lower extremity  edema.  GASTROINTESTINAL: No nausea, vomiting, abdominal pain, diarrhea, constipation.  No hematemesis, melena or hematochezia. GENITOURINARY: No dysuria, frequency, hematuria. ENDOCRINE: No polyuria or nocturia. No heat or cold intolerance. HEMATOLOGY: No anemia, bruising, bleeding. INTEGUMENTARY: No rashes, ulcers, lesions. MUSCULOSKELETAL: Positive left shoulder pain as per HPI no arthritis, gout, dyspnea. NEUROLOGIC: No numbness, tingling, ataxia, seizure-type activity, weakness. PSYCHIATRIC: No anxiety, depression, insomnia.   Past Medical History:  Diagnosis Date  . Anemia due to chronic kidney disease   . Bilateral inguinal hernia   . CAD (coronary artery disease) cardiologist-  dr bensimhon   PTCA of OM in 1998, [facility] OM in 2000, Cath 9/07 LM ok LAD ok. LCX 95% in OM prior to previous stent RCA. nondominant normal EF normal. [place] DES to OM 2007 (PLACED PROXIAMAL TO PREVIOUS STENT)  . Chronic kidney disease, stage III (moderate) [facility])    nephrologist-  dr detarding  . Depression   . Diabetes mellitus type 2, diet-controlled ([place])    followed by pcp,  last A1c 5.2 on 09-10-2017 in epic  . Gout, unspecified    10-29-2017  per pt stable  . HLD (hyperlipidemia)   . HTN (hypertension)   . MGUS (monoclonal gammopathy of unknown significance) previously followed by dr Beryle Beams , lov and released 08/01/2011   IgG kappa dx 2002 8% plasma cells in bone marrow; no lesions on bone X-rays;  . Nocturia   . OA (osteoarthritis)   . OSA on CPAP    per study 01-30-2004  severe osa , AHI 51.6/hr  . PAD (peripheral artery disease) ([place])    05-21-2006  left renal artery stenosis, s/p balloon angioplasty and stenting;  last duplex 02/ 2012  normal , arteries patent  . S/P  coronary artery stent placement    2000--  BMS x1  to OM;   2007-- DES x1  to OM proximal to previous stent  . Secondary hyperparathyroidism of renal origin ([place])   . Urgency of urination   . Wears glasses     Past  Surgical History:  Procedure Laterality Date  . CARDIOVASCULAR STRESS TEST  2010   per dr bensimhon epic note dated 04-18-2016  normal  . [place]   PTCA to OM  . CORONARY ANGIOPLASTY WITH STENT PLACEMENT  2000   PCI and BMS x1 OM  . CORONARY ANGIOPLASTY WITH STENT PLACEMENT  12-18-2005  dr Claiborne Billings   DES x1 to proximal to previously placed stent in OM  . HERNIA REPAIR    . INGUINAL HERNIA REPAIR Bilateral 10/30/2017   Procedure: LAPAROSCOPIC  BILATERAL INGUINAL HERNIA REPAIR  AND LEFT [place];  Surgeon: Michael Boston, MD;  Location: [place];  Service: General;  Laterality: Bilateral;  . INSERTION OF MESH Bilateral 10/30/2017   Procedure: INSERTION OF MESH;  Surgeon: Michael Boston, MD;  Location: [place];  Service: General;  Laterality: Bilateral;  . RENAL ANGIOPLASTY Left 05-21-2006   dr berry   and stenting  . TOTAL KNEE ARTHROPLASTY Left 07-22-2005   dr Shellia Carwin  [facility]  . TRANSTHORACIC ECHOCARDIOGRAM  04/18/2016   ef 44-01%, grade 1 diastolic dysfunction/  mild to moderate AR/  mild MR     reports that he has been smoking cigarettes. He has been smoking about 0.25 packs per day. He has never used smokeless tobacco. He reports previous alcohol use. He reports that he does not use drugs.  Allergies  Allergen Reactions  . Lisinopril Swelling and Other (See Comments)    Angioedema   . Losartan Potassium Swelling and Other (See Comments)    Angioedema   . Crestor [Rosuvastatin] Swelling and Other (See Comments)    Angioedema   . Tape Other (See Comments)    Irritates the skin    Family History  Problem Relation Age of Onset  . Heart disease Mother   . Healthy Father   . Heart disease Sister   . Heart disease Sister   . Cancer Sister        type unknown  . Hypertension Sister   . Diabetes Brother   . Diabetes Brother   . Diabetes Other   . Coronary artery disease Other   . Cancer Other     Prior to  Admission medications   Medication Sig Start Date End Date Taking? Authorizing Provider  ACCU-CHEK SOFTCLIX LANCETS lancets Use to test blood sugar daily. Dx:E11.8 03/25/17   Reed, Tiffany L, DO  Alcohol Swabs (B-D SINGLE USE SWABS REGULAR) PADS Use with testing of blood sugar. Dx:E11.8 03/25/17   Reed, Tiffany L, DO  allopurinol (ZYLOPRIM) 100 MG tablet Take 1 tablet (100 mg total) by mouth daily. 09/05/19   Reed, Tiffany L, DO  amLODipine (NORVASC) 5 MG tablet Take 1 tablet (5 mg total) by mouth daily. 09/05/19   Reed, Tiffany L, DO  aspirin EC 81 MG tablet Take 81 mg by mouth daily.    [provider]  atorvastatin (LIPITOR) 80 MG tablet Take 1 tablet (80 mg total) by mouth daily. 09/05/19   Reed, Tiffany L, DO  citalopram (CELEXA) 20 MG tablet Take 1 tablet (20 mg total) by mouth daily. 09/05/19   Reed, Tiffany L, DO  diclofenac Sodium (VOLTAREN) 1 % GEL  Apply 4 g topically 4 (four) times daily. Patient taking differently: Apply 4 g topically See admin instructions. Apply 4 grams to affected areas four times a day 06/18/20   Reed, Tiffany L, DO  glucose blood (ACCU-CHEK AVIVA PLUS) test strip Use to check blood sugar daily. Dx:E11.8 09/05/19   Reed, Tiffany L, DO  lidocaine (LIDODERM) 5 % Place 1 patch onto the skin daily. Remove & Discard patch within 12 hours or as directed by MD Patient taking differently: Place 1 patch onto the skin See admin instructions. Apply 1 patch to the affected site once a day as needed for pain and remove & discard patch within 12 hours or as directed by MD 04/01/20   Maudie Flakes, MD  metoprolol tartrate (LOPRESSOR) 50 MG tablet Take 1 tablet (50 mg total) by mouth 2 (two) times daily. Patient taking differently: Take 100 mg by mouth in the morning. 09/05/19   Reed, Tiffany L, DO  nitroGLYCERIN (NITROSTAT) 0.4 MG SL tablet DISSOLVE 1 TABLET UNDER THE TONGUE EVERY 5 MINUTES AS  NEEDED FOR CHEST PAIN. MAX  OF 3 TABLETS IN 15 MINUTES. CALL 911 IF PAIN  PERSISTS. Patient taking differently: Place 0.4 mg under the tongue every 5 (five) minutes x 3 doses as needed for chest pain (AND CALL 9-1-1, IF PAIN PERSISTS). 06/06/20   Gayland Curry, DO  ULTRA-THIN II MINI PEN NEEDLE 31G X 5 MM MISC  08/29/14   [provider]    Physical Exam: Vitals:   07/31/20 1730 07/31/20 1800 07/31/20 1900 07/31/20 2000  BP: (!) 143/66 (!) 142/60 (!) 151/81 (!) 137/59  Pulse: 76 71 72 77  Resp: 18 16 (!) 21 12  Temp:      TempSrc:      SpO2: 98% 97% 99% 98%    GENERAL: 87 y.o.-year-old black male patient, well-developed, well-nourished lying in the bed in no acute distress.  Pleasant and cooperative.   HEENT: Head atraumatic, normocephalic. Pupils equal. Mucus membranes moist. NECK: Supple. No JVD. CHEST: Normal breath sounds bilaterally. No wheezing, rales, rhonchi or crackles. No use of accessory muscles of respiration.  No reproducible chest wall tenderness.  CARDIOVASCULAR: S1, S2 normal. No murmurs, rubs, or gallops. Cap refill <2 seconds. Pulses intact distally.  ABDOMEN: Soft, nondistended, nontender. No rebound, guarding, rigidity. Normoactive bowel sounds present in all four quadrants.  EXTREMITIES: Tenderness over left deltoid and biceps tendon, decreased range of motion secondary to pain.  Neurovascularly intact.  No pedal edema, cyanosis, or clubbing. No calf tenderness or Homan's sign.  NEUROLOGIC: The patient is alert and oriented x 3. Cranial nerves II through XII are grossly intact with no focal sensorimotor deficit. PSYCHIATRIC:  Normal affect, mood, thought content. SKIN: Warm, dry, and intact without obvious rash, lesion, or ulcer.    Labs on Admission:  CBC: Recent Labs  Lab 07/27/20 1215 07/31/20 1540  WBC 13.5* 13.3*  NEUTROABS 12.0* 11.1*  HGB 13.0 13.8  HCT 39.8 42.3  MCV 94.8 94.6  PLT 339 921*   Basic Metabolic Panel: Recent Labs  Lab 07/27/20 1215 07/31/20 1540  NA 136 138  K 4.3 4.2  CL 105 105  CO2 20*  21*  GLUCOSE 102* 91  BUN 19 19  CREATININE 1.54* 1.30*  CALCIUM 8.9 9.5   GFR: Estimated Creatinine Clearance: 38.7 mL/min (A) (by C-G formula based on SCr of 1.3 mg/dL (H)). Liver Function Tests: Recent Labs  Lab 07/27/20 1215 07/31/20 1540  AST 31 40  ALT  26 22  ALKPHOS 75 79  BILITOT 1.9* 1.1  PROT 7.7 9.5*  ALBUMIN 3.8 4.4   No results for input(s): LIPASE, AMYLASE in the last 168 hours. No results for input(s): AMMONIA in the last 168 hours. Coagulation Profile: Recent Labs  Lab 07/31/20 1838  INR 1.1   Cardiac Enzymes: No results for input(s): CKTOTAL, CKMB, CKMBINDEX, TROPONINI in the last 168 hours. BNP (last 3 results) No results for input(s): PROBNP in the last 8760 hours. HbA1C: No results for input(s): HGBA1C in the last 72 hours. CBG: Recent Labs  Lab 07/31/20 1602 07/31/20 1850  GLUCAP 90 76   Lipid Profile: No results for input(s): CHOL, HDL, LDLCALC, TRIG, CHOLHDL, LDLDIRECT in the last 72 hours. Thyroid Function Tests: No results for input(s): TSH, T4TOTAL, FREET4, T3FREE, THYROIDAB in the last 72 hours. Anemia Panel: No results for input(s): VITAMINB12, FOLATE, FERRITIN, TIBC, IRON, RETICCTPCT in the last 72 hours. Urine analysis:    Component Value Date/Time   COLORURINE YELLOW 07/31/2020 1743   APPEARANCEUR CLEAR 07/31/2020 1743   LABSPEC 1.018 07/31/2020 1743   PHURINE 5.0 07/31/2020 1743   GLUCOSEU NEGATIVE 07/31/2020 1743   HGBUR SMALL (A) 07/31/2020 [place] 07/31/2020 1743   KETONESUR NEGATIVE 07/31/2020 1743   PROTEINUR >=300 (A) 07/31/2020 1743   NITRITE NEGATIVE 07/31/2020 1743   LEUKOCYTESUR NEGATIVE 07/31/2020 1743   Sepsis Labs: @LABRCNTIP (procalcitonin:4,lacticidven:4) ) Recent Results (from the past 240 hour(s))  Resp Panel by RT-PCR (Flu A&B, Covid) Nasopharyngeal Swab     Status: None   Collection Time: 07/31/20  6:38 PM   Specimen: Nasopharyngeal Swab; Nasopharyngeal(NP) swabs in vial transport  medium  Result Value Ref Range Status   SARS Coronavirus 2 by RT PCR NEGATIVE NEGATIVE Final    Comment: (NOTE) SARS-CoV-2 target nucleic acids are NOT DETECTED.  The SARS-CoV-2 RNA is generally detectable in upper respiratory specimens during the acute phase of infection. The lowest concentration of SARS-CoV-2 viral copies this assay can detect is 138 copies/mL. A negative result does not preclude SARS-Cov-2 infection and should not be used as the sole basis for treatment or other patient management decisions. A negative result may occur with  improper specimen collection/handling, submission of specimen other than nasopharyngeal swab, presence of viral mutation(s) within the areas targeted by this assay, and inadequate number of viral copies(<138 copies/mL). A negative result must be combined with clinical observations, patient history, and epidemiological information. The expected result is Negative.  Fact Sheet for Patients:  EntrepreneurPulse.com.au  Fact Sheet for Healthcare Providers:  IncredibleEmployment.be  This test is no t yet approved or cleared by the [place] FDA and  has been authorized for detection and/or diagnosis of SARS-CoV-2 by FDA under an Emergency Use Authorization (EUA). This EUA will remain  in effect (meaning this test can be used) for the duration of the COVID-19 declaration under Section 564(b)(1) of the Act, 21 U.S.C.section 360bbb-3(b)(1), unless the authorization is terminated  or revoked sooner.       Influenza A by PCR NEGATIVE NEGATIVE Final   Influenza B by PCR NEGATIVE NEGATIVE Final    Comment: (NOTE) The Xpert Xpress SARS-CoV-2/FLU/RSV plus assay is intended as an aid in the diagnosis of influenza from Nasopharyngeal swab specimens and should not be used as a sole basis for treatment. Nasal washings and aspirates are unacceptable for Xpert Xpress SARS-CoV-2/FLU/RSV testing.  Fact Sheet for  Patients: EntrepreneurPulse.com.au  Fact Sheet for Healthcare Providers: IncredibleEmployment.be  This test is not yet approved  or cleared by the [place] and has been authorized for detection and/or diagnosis of SARS-CoV-2 by FDA under an Emergency Use Authorization (EUA). This EUA will remain in effect (meaning this test can be used) for the duration of the COVID-19 declaration under Section 564(b)(1) of the Act, 21 U.S.C. section 360bbb-3(b)(1), unless the authorization is terminated or revoked.  Performed at [facility], [place] [address], [place], [place] [address]      Radiological Exams on Admission: DG Chest 1 View  Result Date: 07/31/2020 CLINICAL DATA:  Recent fall EXAM: CHEST  1 VIEW COMPARISON:  07/30/2015 FINDINGS: Cardiac shadow is enlarged. Aortic calcifications are noted. The lungs are well aerated bilaterally with mild central vascular congestion. No focal infiltrate or effusion is seen. No bony abnormality is noted. IMPRESSION: Changes of mild vascular congestion. No other focal abnormality is seen. Electronically Signed   By: Inez Catalina M.D.   On: 07/31/2020 15:36   CT Head Wo Contrast  Result Date: 07/31/2020 CLINICAL DATA:  Dizziness EXAM: CT HEAD WITHOUT CONTRAST TECHNIQUE: Contiguous axial images were obtained from the base of the skull through the vertex without intravenous contrast. COMPARISON:  CT brain 09/30/2010 FINDINGS: Brain: No acute territorial infarction, hemorrhage, or intracranial mass. Moderate atrophy. Extensive white matter hypodensity most likely chronic small vessel ischemic change. Slightly prominent ventricles felt secondary to atrophy. Vascular: No hyperdense vessels.  Carotid vascular calcification. Skull: No fracture Sinuses/Orbits: Left greater than right mastoid effusions. Other: Small right forehead scalp hematoma IMPRESSION: 1. No CT evidence for acute intracranial abnormality.  Small right forehead scalp hematoma. 2. Atrophy and chronic small vessel ischemic changes of the white matter. 3. Left greater than right mastoid effusions. Electronically Signed   By: Donavan Foil M.D.   On: 07/31/2020 15:13   DG Shoulder Left  Result Date: 07/31/2020 CLINICAL DATA:  Left shoulder pain following recent fall, initial encounter EXAM: LEFT SHOULDER - 2+ VIEW COMPARISON:  None. FINDINGS: Degenerative changes of the acromioclavicular joint and glenohumeral joint are seen. No acute fracture or dislocation is noted. No soft tissue abnormality is seen. IMPRESSION: Degenerative change without acute abnormality. Electronically Signed   By: Inez Catalina M.D.   On: 07/31/2020 15:30    EKG: Normal sinus rhythm at 67 bpm with normal axis and nonspecific ST-T wave changes.   Assessment/Plan  This is a 87 y.o. male with a history of hypertension, hyperlipidemia, coronary artery disease status post stenting, DM2, anemia of chronic disease now being admitted with:  #. NSTEMI - Admit to inpatient with telemetry monitoring. - Trend troponins, check lipids and TSH. - Morphine, nitro, beta blocker, aspirin and statin ordered.   - Heparin drip started in ER and will continue with pharmacy consult.  - Check echo - Cardiology consultation has been requested by EDP and plan for stress test in a.m.  #.  History of CKD 2, baseline - Monitors BMP  #. History of HTN - Continue Norvasc,metoprolol  #.  History of hyperlipidemia - Continue Lipitor  #. History of DM - Continue Accu-Cheks AC at bedtime with regular insulin sliding scale coverage - N.p.o. after midnight  #. History of gout - Continue allopurinol   Admission status: Inpatient, telemetry IV Fluids: Hep-Lock heart Diet/Nutrition: Heart healthy, carb controlled Consults called: Cardiology consulted by ER physician DVT Px: Heparin drip, SCDs and early ambulation. Code Status: Full Code  Disposition Plan: To home in 1-2  days  All the records are reviewed and case discussed with ED  provider. Management plans discussed with the patient and/or family who express understanding and agree with plan of care.  Jadan Hinojos D.O. on 07/31/2020 at 8:30 PM CC: Primary care physician; [name], MD   07/31/2020, 8:30 PM

## 2020-07-31 NOTE — ED Notes (Signed)
Patient in CT

## 2020-07-31 NOTE — ED Triage Notes (Signed)
Patient presented to the ed with c/o left shoulder pain. Shoulder pain started yesterday. Patient had a fall today. No loss of LOC and did not hit his head. Not on blood thinners. Fall was unwitnessed

## 2020-07-31 NOTE — ED Notes (Signed)
This nurse again shared concerns about patient's in ability to stay awake and patient being unable to provide urine sample, this time with Gwyndolyn Saxon, [place].  He also is concerned. Verbal order in add a drug screen and to straight cath patient for urine.

## 2020-07-31 NOTE — ED Provider Notes (Signed)
[facility] DEPT Provider Note   CSN: [id number] Arrival date & time: 07/31/20  1257     History No chief complaint on file.   Kevin Schwartz is a 87 y.o. male.  HPI   Patient with significant medical history of CAD, chronic kidney disease stage III, diabetes type 2, hypertension, presents with chief complaint of left shoulder pain.  He endorses that his shoulder pain started suddenly yesterday, he denies  recent trauma to the area, he states he has pain when he moves his arm, he denies paresthesia or weakness in his left upper extremity, states the pain is on the anterior aspect of his deltoid.  He also notes that this morning he fell today, he landed on his butt, states that his legs felt weak and gave out on him, he had no associated chest pain, shortness of breath, denies lightheadedness or dizziness.  He denies hitting his head, losing conscious, is not on anticoagulant.  States he is never had this happen in the past.  He currently lives alone, lives at a motel, he denies alleviating factors.  Patient denies headaches, fevers, chills, shortness of breath, chest pain, abdominal pain, nausea, vomiting, diarrhea, worsening pedal edema.  Past Medical History:  Diagnosis Date  . Anemia due to chronic kidney disease   . Bilateral inguinal hernia   . CAD (coronary artery disease) cardiologist-  dr bensimhon   PTCA of OM in 1998, [place] OM in 2000, Cath 9/07 LM ok LAD ok. LCX 95% in OM prior to previous stent RCA. nondominant normal EF normal. [place] DES to OM 2007 (PLACED PROXIAMAL TO PREVIOUS STENT)  . Chronic kidney disease, stage III (moderate) [facility])    nephrologist-  dr detarding  . Depression   . Diabetes mellitus type 2, diet-controlled ([place])    followed by pcp,  last A1c 5.2 on 09-10-2017 in epic  . Gout, unspecified    10-29-2017  per pt stable  . HLD (hyperlipidemia)   . HTN (hypertension)   . MGUS (monoclonal gammopathy of unknown  significance) previously followed by dr Beryle Beams , lov and released 08/01/2011   IgG kappa dx 2002 8% plasma cells in bone marrow; no lesions on bone X-rays;  . Nocturia   . OA (osteoarthritis)   . OSA on CPAP    per study 01-30-2004  severe osa , AHI 51.6/hr  . PAD (peripheral artery disease) ([place])    05-21-2006  left renal artery stenosis, s/p balloon angioplasty and stenting;  last duplex 02/ 2012  normal , arteries patent  . S/P coronary artery stent placement    2000--  BMS x1  to OM;   2007-- DES x1  to OM proximal to previous stent  . Secondary hyperparathyroidism of renal origin ([place])   . Urgency of urination   . Wears glasses     Patient Active Problem List   Diagnosis Date Noted  . NSTEMI (non-ST elevated myocardial infarction) ([place]) 07/31/2020  . Grieving 02/16/2020  . Bilateral inguinal hernia s/p lap hernia repair w mesh 10/30/2017 10/30/2017  . Left femoral hernia s/p lap hernia repair w mesh 10/30/2017 10/30/2017  . Loss of weight 01/21/2016  . Heme positive stool 01/21/2016  . Heart murmur 08/18/2013  . Chronic kidney disease, stage III (moderate) ([place]) 07/14/2013  . Hyperlipidemia LDL goal < 100 07/14/2013  . Essential hypertension, benign 07/14/2013  . Type II or unspecified type diabetes mellitus with renal manifestations, not stated as uncontrolled(250.40) 07/14/2013  . Insomnia 07/14/2013  .  Angioedema of lips 12/26/2012  . MGUS (monoclonal gammopathy of unknown significance) 08/01/2011  . Gout of right wrist 08/01/2011  . S/P coronary artery stent placement 08/01/2011  . DM type 2 causing CKD stage 2 ([place]) 08/01/2011  . Bradycardia 10/15/2010  . Mixed hyperlipidemia due to type 2 diabetes mellitus ([place]) 05/01/2010  . HYPERTENSION, UNSPECIFIED 05/01/2010  . CAD, NATIVE VESSEL 05/01/2010  . PVD 05/01/2010    Past Surgical History:  Procedure Laterality Date  . CARDIOVASCULAR STRESS TEST  2010   per dr bensimhon epic note dated 04-18-2016  normal  .  [place]   PTCA to OM  . CORONARY ANGIOPLASTY WITH STENT PLACEMENT  2000   PCI and BMS x1 OM  . CORONARY ANGIOPLASTY WITH STENT PLACEMENT  12-18-2005  dr Claiborne Billings   DES x1 to proximal to previously placed stent in OM  . HERNIA REPAIR    . INGUINAL HERNIA REPAIR Bilateral 10/30/2017   Procedure: LAPAROSCOPIC  BILATERAL INGUINAL HERNIA REPAIR  AND LEFT [place];  Surgeon: Michael Boston, MD;  Location: [facility];  Service: General;  Laterality: Bilateral;  . INSERTION OF MESH Bilateral 10/30/2017   Procedure: INSERTION OF MESH;  Surgeon: Michael Boston, MD;  Location: [facility];  Service: General;  Laterality: Bilateral;  . RENAL ANGIOPLASTY Left 05-21-2006   dr berry   and stenting  . TOTAL KNEE ARTHROPLASTY Left 07-22-2005   dr Shellia Carwin  [facility]  . TRANSTHORACIC ECHOCARDIOGRAM  04/18/2016   ef 99-83%, grade 1 diastolic dysfunction/  mild to moderate AR/  mild MR       Family History  Problem Relation Age of Onset  . Heart disease Mother   . Healthy Father   . Heart disease Sister   . Heart disease Sister   . Cancer Sister        type unknown  . Hypertension Sister   . Diabetes Brother   . Diabetes Brother   . Diabetes Other   . Coronary artery disease Other   . Cancer Other     Social History   Tobacco Use  . Smoking status: Current Some Day Smoker    Packs/day: 0.25    Types: Cigarettes  . Smokeless tobacco: Never Used  . Tobacco comment: 10-29-2017  per pt was smoking 1pp2d, stopped June 2019  Vaping Use  . Vaping Use: Never used  Substance Use Topics  . Alcohol use: Not Currently  . Drug use: No    Home Medications Prior to Admission medications   Medication Sig Start Date End Date Taking? Authorizing Provider  ACCU-CHEK SOFTCLIX LANCETS lancets Use to test blood sugar daily. Dx:E11.8 03/25/17   Reed, Tiffany L, DO  Alcohol Swabs (B-D SINGLE USE SWABS REGULAR) PADS Use with testing of blood sugar.  Dx:E11.8 03/25/17   Reed, Tiffany L, DO  allopurinol (ZYLOPRIM) 100 MG tablet Take 1 tablet (100 mg total) by mouth daily. 09/05/19   Reed, Tiffany L, DO  amLODipine (NORVASC) 5 MG tablet Take 1 tablet (5 mg total) by mouth daily. 09/05/19   Reed, Tiffany L, DO  aspirin EC 81 MG tablet Take 81 mg by mouth daily.    [provider]  atorvastatin (LIPITOR) 80 MG tablet Take 1 tablet (80 mg total) by mouth daily. 09/05/19   Reed, Tiffany L, DO  citalopram (CELEXA) 20 MG tablet Take 1 tablet (20 mg total) by mouth daily. 09/05/19   Reed, Tiffany L, DO  diclofenac Sodium (VOLTAREN)  1 % GEL Apply 4 g topically 4 (four) times daily. Patient taking differently: Apply 4 g topically See admin instructions. Apply 4 grams to affected areas four times a day 06/18/20   Reed, Tiffany L, DO  glucose blood (ACCU-CHEK AVIVA PLUS) test strip Use to check blood sugar daily. Dx:E11.8 09/05/19   Reed, Tiffany L, DO  lidocaine (LIDODERM) 5 % Place 1 patch onto the skin daily. Remove & Discard patch within 12 hours or as directed by MD Patient taking differently: Place 1 patch onto the skin See admin instructions. Apply 1 patch to the affected site once a day as needed for pain and remove & discard patch within 12 hours or as directed by MD 04/01/20   Maudie Flakes, MD  metoprolol tartrate (LOPRESSOR) 50 MG tablet Take 1 tablet (50 mg total) by mouth 2 (two) times daily. Patient taking differently: Take 100 mg by mouth in the morning. 09/05/19   Reed, Tiffany L, DO  nitroGLYCERIN (NITROSTAT) 0.4 MG SL tablet DISSOLVE 1 TABLET UNDER THE TONGUE EVERY 5 MINUTES AS  NEEDED FOR CHEST PAIN. MAX  OF 3 TABLETS IN 15 MINUTES. CALL 911 IF PAIN PERSISTS. Patient taking differently: Place 0.4 mg under the tongue every 5 (five) minutes x 3 doses as needed for chest pain (AND CALL 9-1-1, IF PAIN PERSISTS). 06/06/20   Reed, Tiffany L, DO  ULTRA-THIN II MINI PEN NEEDLE 31G X 5 MM MISC  08/29/14   [provider]    Allergies     Lisinopril, Losartan potassium, Crestor [rosuvastatin], and Tape  Review of Systems   Review of Systems  Constitutional: Negative for chills and fever.  HENT: Negative for congestion.   Respiratory: Negative for shortness of breath.   Cardiovascular: Negative for chest pain.  Gastrointestinal: Negative for abdominal pain, diarrhea, nausea and vomiting.  Genitourinary: Negative for enuresis and flank pain.  Musculoskeletal: Negative for back pain.       Left shoulder pain.   Skin: Negative for rash.  Neurological: Negative for dizziness and headaches.  Hematological: Does not bruise/bleed easily.    Physical Exam Updated Vital Signs BP (!) 140/56   Pulse 74   Temp 98.1 F (36.7 C) (Oral)   Resp (!) 21   SpO2 98%   Physical Exam Vitals and nursing note reviewed.  Constitutional:      General: He is not in acute distress.    Appearance: He is not ill-appearing.  HENT:     Head: Normocephalic and atraumatic.     Nose: No congestion.     Mouth/Throat:     Mouth: Mucous membranes are moist.     Pharynx: Oropharynx is clear. No oropharyngeal exudate or posterior oropharyngeal erythema.  Eyes:     Conjunctiva/sclera: Conjunctivae normal.  Cardiovascular:     Rate and Rhythm: Normal rate and regular rhythm.     Pulses: Normal pulses.     Heart sounds: No murmur heard. No friction rub. No gallop.   Pulmonary:     Effort: No respiratory distress.     Breath sounds: No wheezing, rhonchi or rales.  Abdominal:     Palpations: Abdomen is soft.     Tenderness: There is no abdominal tenderness.  Musculoskeletal:     Right lower leg: No edema.     Left lower leg: No edema.     Comments: Patient spine was palpated was nontender to palpation.  Patient is left shoulder is palpated there is no gross deformities, he was tender  to palpation along the anterior aspect of his deltoid, he had decreased range of motion due to pain, is able to move at his fingers and wrist elbow without  difficulty.  Neurovascularly intact.  Skin:    General: Skin is warm and dry.  Neurological:     Mental Status: He is alert.     GCS: GCS eye subscore is 4. GCS verbal subscore is 5. GCS motor subscore is 6.     Motor: No weakness.     Gait: Gait is intact.     Comments: Cranial nerves II through XII are grossly intact.  Patient noted with use word finding.  Psychiatric:        Mood and Affect: Mood normal.     ED Results / Procedures / Treatments   Labs (all labs ordered are listed, but only abnormal results are displayed) Labs Reviewed  CBC WITH DIFFERENTIAL/PLATELET - Abnormal; Notable for the following components:      Result Value   WBC 13.3 (*)    Platelets 421 (*)    Neutro Abs 11.1 (*)    Monocytes Absolute 1.1 (*)    All other components within normal limits  COMPREHENSIVE METABOLIC PANEL - Abnormal; Notable for the following components:   CO2 21 (*)    Creatinine, Ser 1.30 (*)    Total Protein 9.5 (*)    GFR, Estimated 53 (*)    All other components within normal limits  URINALYSIS, ROUTINE W REFLEX MICROSCOPIC - Abnormal; Notable for the following components:   Hgb urine dipstick SMALL (*)    Protein, ur >=300 (*)    All other components within normal limits  TROPONIN I (HIGH SENSITIVITY) - Abnormal; Notable for the following components:   Troponin I (High Sensitivity) 315 (*)    All other components within normal limits  TROPONIN I (HIGH SENSITIVITY) - Abnormal; Notable for the following components:   Troponin I (High Sensitivity) 316 (*)    All other components within normal limits  RESP PANEL BY RT-PCR (FLU A&B, COVID) ARPGX2  RAPID URINE DRUG SCREEN, HOSP PERFORMED  PROTIME-INR  APTT  HEPARIN LEVEL (UNFRACTIONATED)  CBC  TSH  T4, FREE  LIPID PANEL  CBG MONITORING, ED  CBG MONITORING, ED    EKG EKG Interpretation  Date/Time:  Tuesday July 31 2020 15:48:38 EDT Ventricular Rate:  67 PR Interval:  184 QRS Duration: 105 QT Interval:  446 QTC  Calculation: 419 R Axis:   25 Text Interpretation: Sinus rhythm Supraventricular bigeminy Anteroseptal infarct, old No significant change since last tracing Confirmed by Wandra Arthurs [id number]) on 07/31/2020 6:28:31 PM   Radiology DG Chest 1 View  Result Date: 07/31/2020 CLINICAL DATA:  Recent fall EXAM: CHEST  1 VIEW COMPARISON:  07/30/2015 FINDINGS: Cardiac shadow is enlarged. Aortic calcifications are noted. The lungs are well aerated bilaterally with mild central vascular congestion. No focal infiltrate or effusion is seen. No bony abnormality is noted. IMPRESSION: Changes of mild vascular congestion. No other focal abnormality is seen. Electronically Signed   By: Inez Catalina M.D.   On: 07/31/2020 15:36   CT Head Wo Contrast  Result Date: 07/31/2020 CLINICAL DATA:  Dizziness EXAM: CT HEAD WITHOUT CONTRAST TECHNIQUE: Contiguous axial images were obtained from the base of the skull through the vertex without intravenous contrast. COMPARISON:  CT brain 09/30/2010 FINDINGS: Brain: No acute territorial infarction, hemorrhage, or intracranial mass. Moderate atrophy. Extensive white matter hypodensity most likely chronic small vessel ischemic change. Slightly prominent ventricles felt  secondary to atrophy. Vascular: No hyperdense vessels.  Carotid vascular calcification. Skull: No fracture Sinuses/Orbits: Left greater than right mastoid effusions. Other: Small right forehead scalp hematoma IMPRESSION: 1. No CT evidence for acute intracranial abnormality. Small right forehead scalp hematoma. 2. Atrophy and chronic small vessel ischemic changes of the white matter. 3. Left greater than right mastoid effusions. Electronically Signed   By: Donavan Foil M.D.   On: 07/31/2020 15:13   DG Shoulder Left  Result Date: 07/31/2020 CLINICAL DATA:  Left shoulder pain following recent fall, initial encounter EXAM: LEFT SHOULDER - 2+ VIEW COMPARISON:  None. FINDINGS: Degenerative changes of the acromioclavicular joint  and glenohumeral joint are seen. No acute fracture or dislocation is noted. No soft tissue abnormality is seen. IMPRESSION: Degenerative change without acute abnormality. Electronically Signed   By: Inez Catalina M.D.   On: 07/31/2020 15:30    Procedures .Critical Care Performed by: Marcello Fennel, PA-C Authorized by: Marcello Fennel, PA-C   Critical care provider statement:    Critical care time (minutes):  45   Critical care time was exclusive of:  Separately billable procedures and treating other patients   Critical care was necessary to treat or prevent imminent or life-threatening deterioration of the following conditions:  Cardiac failure   Critical care was time spent personally by me on the following activities:  Discussions with consultants, evaluation of patient's response to treatment, examination of patient, ordering and performing treatments and interventions, ordering and review of laboratory studies, ordering and review of radiographic studies, pulse oximetry, re-evaluation of patient's condition and review of old charts   I assumed direction of critical care for this patient from another provider in my specialty: no     Care discussed with: admitting provider       Medications Ordered in ED Medications  heparin ADULT infusion 100 units/mL (25000 units/273mL) (900 Units/hr Intravenous New Bag/Given 07/31/20 1913)  allopurinol (ZYLOPRIM) tablet 100 mg (has no administration in time range)  aspirin EC tablet 81 mg (81 mg Oral Not Given 07/31/20 2045)  amLODipine (NORVASC) tablet 5 mg (5 mg Oral Given 07/31/20 2117)  atorvastatin (LIPITOR) tablet 80 mg (80 mg Oral Given 07/31/20 2117)  metoprolol tartrate (LOPRESSOR) tablet 50 mg (50 mg Oral Given 07/31/20 2117)  citalopram (CELEXA) tablet 20 mg (20 mg Oral Given 07/31/20 2117)  nitroGLYCERIN (NITROSTAT) SL tablet 0.4 mg (has no administration in time range)  ondansetron (ZOFRAN) injection 4 mg (has no administration in time  range)  0.9 %  sodium chloride infusion (has no administration in time range)  acetaminophen (TYLENOL) tablet 650 mg (has no administration in time range)  zolpidem (AMBIEN) tablet 5 mg (has no administration in time range)  ALPRAZolam (XANAX) tablet 0.25 mg (has no administration in time range)  aspirin tablet 325 mg (325 mg Oral Given 07/31/20 1841)  heparin bolus via infusion 4,000 Units (4,000 Units Intravenous Bolus from Bag 07/31/20 1914)    ED Course  I have reviewed the triage vital signs and the nursing notes.  Pertinent labs & imaging results that were available during my care of the patient were reviewed by me and considered in my medical decision making (see chart for details).    MDM Rules/Calculators/A&P                         Initial impression-patient presents with left shoulder pain and fall.  He is alert, does not appear in acute distress and vital  signs reassuring.  Concern for orthopedic injury will obtain imaging, basic lab work, EKG, and CT head due to fall.  Work-up-CBC shows slight leukocytosis of 13.3, CMP shows slight decrease in CO2 of 21, creatinine 1.3 appears to be at baseline for patient, GFR 53.  First troponin is 315, second troponin 316, UA shows many protein, no nitrates, leukocytes, mucus and hyaline cast present.  INR 1.1, prothrombin time 14.6, respiratory panel negative, rapid drug screen negative chest x-ray shows mild basilar congestion, x-ray of shoulder shows degenerative changes without acute findings.  CT head negative for acute findings.  Reassessment patient was able to ambulate with assistance.  Patient has troponin of 315 without chest pain at this time, concerned this could be ACS versus poor excretion of troponin due to CKD.   will start patient on heparin, aspirin, consult cardiology for further recommendations.  Patient was assessed after starting on heparin, has no complaints this time.  Vital signs remained stable.  Patient agreeable for  admission  Consult spoke with Dr. Sallyanne Kuster of the cardiology team, he recommends the patient will need echocardiogram and possible stress test for further evaluation.  He states as long as long as second opponent does not increase significantly patient can remain at [facility] and ischemic consult on him tomorrow morning.  If troponins increases significantly patient will need to be transferred to [facility] for urgent cardiology consult.  Spoke with Dr. Ara Kussmaul of the hospitalist team, she is except the patient come down and evaluate.  Rule out-I have low suspicion for systemic infection as patient is nontoxic-appearing, vital signs reassuring no obvious source infection on my exam.  Patient does have noted increased leukocytosis but I suspect this is more of acute phase reactants versus infectious etiology.  I have low suspicion for ACS as history is atypical, EKG was sinus rhythm without signs of ischemia, patient had a delta troponin.  Patient is troponin is  elevated at 316 but it is unclear if this is due to poor renal function versus new acute ischemia, anticipate patient will need stress test and echo for further evaluation.  low suspicion for PE as patient denies pleuritic chest pain, shortness of breath, patient denies leg pain, no pedal edema noted on exam, vital signs are reassuring.  low suspicion for AAA or aortic dissection as history is atypical, patient has low risk factors.  Low suspicion for systemic infection as patient is nontoxic-appearing, vital signs reassuring, no obvious source infection noted on exam.   Plan-admit to medicine for further cardiac work-up, patient care will be transferred to admitting team.   Final Clinical Impression(s) / ED Diagnoses Final diagnoses:  Elevated troponin  Acute pain of left shoulder    Rx / DC Orders ED Discharge Orders    None       Marcello Fennel, PA-C 07/31/20 2118    Drenda Freeze, MD 07/31/20 [phone]

## 2020-07-31 NOTE — ED Notes (Addendum)
This nurse concerned about patient's inability to stay awake. Asked patient if he takes any kind of pain medication and he staed only tylenol.  Patient keeps falling asleep with snoring respirations when getting IV started and asking him questions. Had Ladona Mow, RN that triaged patient come see if he is the same as when she triaged him, she stated that the snoring respirations is new and that he was slow to response in triage.  Patient CBG is 90, vitals otherwise stable.  This nurse shared my concerns with Kathrynn Humble, EDP and gave him patient's EKG, he says he will see patient soon.

## 2020-08-01 ENCOUNTER — Inpatient Hospital Stay (HOSPITAL_COMMUNITY): Payer: Medicare Other

## 2020-08-01 ENCOUNTER — Encounter (HOSPITAL_COMMUNITY): Payer: Self-pay | Admitting: Family Medicine

## 2020-08-01 ENCOUNTER — Inpatient Hospital Stay (HOSPITAL_COMMUNITY)
Admission: EM | Disposition: A | Discharge status: Home Health | Payer: Self-pay | Source: Home / Self Care | Attending: Internal Medicine

## 2020-08-01 DIAGNOSIS — I2510 Atherosclerotic heart disease of native coronary artery without angina pectoris: Secondary | ICD-10-CM | POA: Diagnosis not present

## 2020-08-01 DIAGNOSIS — R778 Other specified abnormalities of plasma proteins: Secondary | ICD-10-CM

## 2020-08-01 DIAGNOSIS — I214 Non-ST elevation (NSTEMI) myocardial infarction: Secondary | ICD-10-CM | POA: Diagnosis not present

## 2020-08-01 DIAGNOSIS — M25512 Pain in left shoulder: Secondary | ICD-10-CM

## 2020-08-01 HISTORY — PX: LEFT HEART CATH AND CORONARY ANGIOGRAPHY: CATH118249

## 2020-08-01 LAB — GLUCOSE, CAPILLARY
Glucose-Capillary: 82 mg/dL (ref 70–99)
Glucose-Capillary: 69 mg/dL — ABNORMAL LOW (ref 70–99)
Glucose-Capillary: 128 mg/dL — ABNORMAL HIGH (ref 70–99)
Glucose-Capillary: 65 mg/dL — ABNORMAL LOW (ref 70–99)
Glucose-Capillary: 95 mg/dL (ref 70–99)

## 2020-08-01 LAB — CBC
HCT: 33.5 % — ABNORMAL LOW (ref 39.0–52.0)
Hemoglobin: 11.1 g/dL — ABNORMAL LOW (ref 13.0–17.0)
MCH: 31 pg (ref 26.0–34.0)
MCHC: 33.1 g/dL (ref 30.0–36.0)
MCV: 93.6 fL (ref 80.0–100.0)
Platelets: 390 10*3/uL (ref 150–400)
RBC: 3.58 MIL/uL — ABNORMAL LOW (ref 4.22–5.81)
RDW: 13.6 % (ref 11.5–15.5)
WBC: 9.2 10*3/uL (ref 4.0–10.5)
nRBC: 0 % (ref 0.0–0.2)

## 2020-08-01 LAB — LIPID PANEL
Cholesterol: 106 mg/dL (ref 0–200)
HDL: 39 mg/dL — ABNORMAL LOW (ref >40)
LDL Cholesterol: 57 mg/dL (ref 0–99)
Total CHOL/HDL Ratio: 2.7 RATIO
Triglycerides: 50 mg/dL (ref <150)
VLDL: 10 mg/dL (ref 0–40)

## 2020-08-01 LAB — TROPONIN I (HIGH SENSITIVITY): Troponin I (High Sensitivity): 175 ng/L (ref <18)

## 2020-08-01 LAB — HEPARIN LEVEL (UNFRACTIONATED): Heparin Unfractionated: <0.1 IU/mL — ABNORMAL LOW (ref 0.30–0.70)

## 2020-08-01 LAB — T4, FREE: Free T4: 0.96 ng/dL (ref 0.61–1.12)

## 2020-08-01 LAB — TSH: TSH: 2.483 u[IU]/mL (ref 0.350–4.500)

## 2020-08-01 SURGERY — LEFT HEART CATH AND CORONARY ANGIOGRAPHY
Anesthesia: LOCAL

## 2020-08-01 MED ORDER — VERAPAMIL HCL 2.5 MG/ML IV SOLN
INTRAVENOUS | Status: DC | PRN
  Administered 2020-08-01: 10 mL via INTRA_ARTERIAL

## 2020-08-01 MED ORDER — CLOPIDOGREL BISULFATE 75 MG PO TABS
300.0000 mg | ORAL_TABLET | Freq: Once | ORAL | Status: AC
  Administered 2020-08-02: 300 mg via ORAL
  Filled 2020-08-01: qty 4

## 2020-08-01 MED ORDER — IOHEXOL 350 MG/ML SOLN
INTRAVENOUS | Status: DC | PRN
  Administered 2020-08-01: 55 mL

## 2020-08-01 MED ORDER — HEPARIN (PORCINE) IN NACL 1000-0.9 UT/500ML-% IV SOLN
INTRAVENOUS | Status: DC | PRN
  Administered 2020-08-01: 500 mL

## 2020-08-01 MED ORDER — SODIUM CHLORIDE 0.9 % IV SOLN: INTRAVENOUS | Status: DC

## 2020-08-01 MED ORDER — DIAZEPAM 5 MG PO TABS: 5.0000 mg | ORAL_TABLET | ORAL | Status: DC | PRN

## 2020-08-01 MED ORDER — FENTANYL CITRATE (PF) 100 MCG/2ML IJ SOLN
INTRAMUSCULAR | Status: AC
  Filled 2020-08-01: qty 2

## 2020-08-01 MED ORDER — HEPARIN BOLUS VIA INFUSION
2000.0000 [IU] | Freq: Once | INTRAVENOUS | Status: AC
  Administered 2020-08-01: 2000 [IU] via INTRAVENOUS
  Filled 2020-08-01: qty 2000

## 2020-08-01 MED ORDER — SODIUM CHLORIDE 0.9% FLUSH: 3.0000 mL | Freq: Two times a day (BID) | INTRAVENOUS | Status: DC

## 2020-08-01 MED ORDER — HEPARIN SODIUM (PORCINE) 1000 UNIT/ML IJ SOLN
INTRAMUSCULAR | Status: AC
  Filled 2020-08-01: qty 1

## 2020-08-01 MED ORDER — HEPARIN (PORCINE) IN NACL 1000-0.9 UT/500ML-% IV SOLN
INTRAVENOUS | Status: AC
  Filled 2020-08-01: qty 500

## 2020-08-01 MED ORDER — ACETAMINOPHEN 325 MG PO TABS
650.0000 mg | ORAL_TABLET | ORAL | Status: DC | PRN
  Administered 2020-08-02: 650 mg via ORAL
  Filled 2020-08-01: qty 2

## 2020-08-01 MED ORDER — HEPARIN SODIUM (PORCINE) 1000 UNIT/ML IJ SOLN
INTRAMUSCULAR | Status: DC | PRN
  Administered 2020-08-01: 3500 [IU] via INTRAVENOUS

## 2020-08-01 MED ORDER — ASPIRIN 81 MG PO CHEW: 81.0000 mg | CHEWABLE_TABLET | ORAL | Status: DC

## 2020-08-01 MED ORDER — LABETALOL HCL 5 MG/ML IV SOLN: 10.0000 mg | INTRAVENOUS | Status: AC | PRN

## 2020-08-01 MED ORDER — VERAPAMIL HCL 2.5 MG/ML IV SOLN
INTRAVENOUS | Status: AC
  Filled 2020-08-01: qty 2

## 2020-08-01 MED ORDER — SODIUM CHLORIDE 0.9 % IV SOLN: 250.0000 mL | INTRAVENOUS | Status: DC | PRN

## 2020-08-01 MED ORDER — PERFLUTREN LIPID MICROSPHERE
1.0000 mL | INTRAVENOUS | Status: AC | PRN
  Administered 2020-08-01: 2 mL via INTRAVENOUS
  Filled 2020-08-01: qty 10

## 2020-08-01 MED ORDER — HYDRALAZINE HCL 20 MG/ML IJ SOLN: 10.0000 mg | INTRAMUSCULAR | Status: AC | PRN

## 2020-08-01 MED ORDER — DEXTROSE 50 % IV SOLN
12.5000 g | INTRAVENOUS | Status: AC
  Administered 2020-08-01: 12.5 g via INTRAVENOUS
  Filled 2020-08-01: qty 50

## 2020-08-01 MED ORDER — LIDOCAINE-EPINEPHRINE 1 %-1:100000 IJ SOLN
INTRAMUSCULAR | Status: AC
  Filled 2020-08-01: qty 1

## 2020-08-01 MED ORDER — HEPARIN (PORCINE) 25000 UT/250ML-% IV SOLN
1400.0000 [IU]/h | INTRAVENOUS | Status: DC
  Administered 2020-08-01: 1200 [IU]/h via INTRAVENOUS
  Administered 2020-08-02: 1400 [IU]/h via INTRAVENOUS
  Filled 2020-08-01: qty 250

## 2020-08-01 MED ORDER — MIDAZOLAM HCL 2 MG/2ML IJ SOLN
INTRAMUSCULAR | Status: AC
  Filled 2020-08-01: qty 2

## 2020-08-01 MED ORDER — ONDANSETRON HCL 4 MG/2ML IJ SOLN: 4.0000 mg | Freq: Four times a day (QID) | INTRAMUSCULAR | Status: DC | PRN

## 2020-08-01 MED ORDER — SODIUM CHLORIDE 0.9% FLUSH: 3.0000 mL | INTRAVENOUS | Status: DC | PRN

## 2020-08-01 MED ORDER — MIDAZOLAM HCL 2 MG/2ML IJ SOLN
INTRAMUSCULAR | Status: DC | PRN
  Administered 2020-08-01: 1 mg via INTRAVENOUS

## 2020-08-01 MED ORDER — SODIUM CHLORIDE 0.9% FLUSH
3.0000 mL | Freq: Two times a day (BID) | INTRAVENOUS | Status: DC
  Administered 2020-08-03: 3 mL via INTRAVENOUS

## 2020-08-01 MED ORDER — ATORVASTATIN CALCIUM 80 MG PO TABS: 80.0000 mg | ORAL_TABLET | Freq: Every day | ORAL | Status: DC

## 2020-08-01 MED ORDER — LIDOCAINE HCL (PF) 1 % IJ SOLN
INTRAMUSCULAR | Status: DC | PRN
  Administered 2020-08-01: 2 mL

## 2020-08-01 MED ORDER — ASPIRIN 81 MG PO CHEW: 81.0000 mg | CHEWABLE_TABLET | Freq: Every day | ORAL | Status: DC

## 2020-08-01 SURGICAL SUPPLY — 9 items
CATH OPTITORQUE TIG 4.0 5F (CATHETERS) ×2 IMPLANT
DEVICE RAD COMP TR BAND LRG (VASCULAR PRODUCTS) ×2 IMPLANT
GLIDESHEATH SLEND SS 6F .021 (SHEATH) ×2 IMPLANT
GUIDEWIRE INQWIRE 1.5J.035X260 (WIRE) ×1 IMPLANT
INQWIRE 1.5J .035X260CM (WIRE) ×2
KIT HEART LEFT (KITS) ×2 IMPLANT
PACK CARDIAC CATHETERIZATION (CUSTOM PROCEDURE TRAY) ×2 IMPLANT
TRANSDUCER W/STOPCOCK (MISCELLANEOUS) ×2 IMPLANT
TUBING CIL FLEX 10 FLL-RA (TUBING) ×2 IMPLANT

## 2020-08-01 NOTE — Progress Notes (Signed)
Kevin Schwartz  DZH:[id number] DOB: 18-Mar-1931 DOA: 07/31/2020 PCP: [name], MD    Brief Narrative:  [phone] w/ a history of CAD s/p PCI, DM2, RAS, CKD stage II, HTN, HLD, ACD who presented to the ED 4/19 with left shoulder pain.  He also reported an episode the morning of his presentation in which he suddenly felt weak and dizzy/lightheaded and fell to the ground on his right side.  There was no loss of consciousness or head trauma.  In the ED an EKG was without obvious ischemic change but his initial troponin was elevated at 300.  Cardiology was consulted and the patient was admitted to the acute unit  Significant Events:  4/19 admission 4/20 tx to [facility] for cardiac cath: multivessel CAD (see report) - plan for intervention on previously stented circ marginal vessel and mid LAD 4/21  Consultants:  Cardiology  Code Status: FULL CODE  Antimicrobials:  none  DVT prophylaxis: IV heparin  Subjective: Resting comfortably at the time of my visit, about to be transported to [facility] for cardiac cath.  Denies any current chest pain shoulder pain or shortness of breath.  Is in good spirits.  Has no questions.  Assessment & Plan:  NSTEMI - CAD/MI in 1998 and PCIs to OM several times Cardiac cath today has revealed many areas of significant coronary artery disease -cardiology plans to intervene on 2 of these lesions but given his CKD wishes to further hydrate him and perform a second cardiac cath for intervention 4/21 - continue heparin drip -continue nitroglycerin, aspirin, statin, morphine - troponin trending downward - beta-blocker discontinued due to bradycardia  Bradycardia - Mobitz II Heart Block  EKG without concerning ischemic changes this admit but has captured significant bradycardia with heart rates as low as 40 - discontinued beta-blocker - EP sidelined by Cards and reports EKGs most c/w Mobitz II - decision to be made on home heart monitor by Cards   CKD stage  II Creatinine at baseline of 1.2-1.5 =- monitor post cardiac cath - hydrate   Recent Labs  Lab 07/27/20 1215 07/31/20 1540  CREATININE 1.54* 1.30*    Hx L renal artery stent  needs follow up dopplers as outpt - followed by Dr. Gwenlyn Found  HTN Continue home Norvasc - blood pressure controlled - BB stopped as above   HLD Continue usual Lipitor dose -LDL 57  DM2 CBG well controlled at this time  Anemia of chronic disease - anemia of chronic kidney disease Hemoglobin stable  Gout Continue allopurinol   Family Communication: no family present at time of exam  Status is: Inpatient  Remains inpatient appropriate because:Inpatient level of care appropriate due to severity of illness   Dispo: The patient is from: Home              Anticipated d/c is to: Home              Patient currently is not medically stable to d/c.   Difficult to place patient No   Objective: Blood pressure 125/64, pulse 62, temperature 98.1 F (36.7 C), resp. rate 14, height 5\' 8"  (1.727 m), weight 70.9 kg, SpO2 99 %.  Intake/Output Summary (Last 24 hours) at 08/01/2020 0748 Last data filed at 08/01/2020 0300 Gross per 24 hour  Intake 492.55 ml  Output --  Net 492.55 ml   Filed Weights   07/31/20 2219  Weight: 70.9 kg    Examination: General: No acute respiratory distress Lungs: Clear to auscultation bilaterally without  wheezes or crackles Cardiovascular: Regular rate and rhythm without murmur gallop or rub normal S1 and S2 Abdomen: Nontender, nondistended, soft, bowel sounds positive, no rebound, no ascites, no appreciable mass Extremities: No significant cyanosis, clubbing, or edema bilateral lower extremities  CBC: Recent Labs  Lab 07/27/20 1215 07/31/20 1540 08/01/20 0320  WBC 13.5* 13.3* 9.2  NEUTROABS 12.0* 11.1*  --   HGB 13.0 13.8 11.1*  HCT 39.8 42.3 33.5*  MCV 94.8 94.6 93.6  PLT 339 421* 706   Basic Metabolic Panel: Recent Labs  Lab 07/27/20 1215 07/31/20 1540  NA 136  138  K 4.3 4.2  CL 105 105  CO2 20* 21*  GLUCOSE 102* 91  BUN 19 19  CREATININE 1.54* 1.30*  CALCIUM 8.9 9.5   GFR: Estimated Creatinine Clearance: 38.7 mL/min (A) (by C-G formula based on SCr of 1.3 mg/dL (H)).  Liver Function Tests: Recent Labs  Lab 07/27/20 1215 07/31/20 1540  AST 31 40  ALT 26 22  ALKPHOS 75 79  BILITOT 1.9* 1.1  PROT 7.7 9.5*  ALBUMIN 3.8 4.4    Coagulation Profile: Recent Labs  Lab 07/31/20 1838  INR 1.1    HbA1C: Hgb A1c MFr Bld  Date/Time Value Ref Range Status  06/13/2020 09:30 AM 5.3 <5.7 % of total Hgb Final    Comment:    For the purpose of screening for the presence of diabetes: . <5.7%       Consistent with the absence of diabetes 5.7-6.4%    Consistent with increased risk for diabetes             (prediabetes) > or =6.5%  Consistent with diabetes . This assay result is consistent with a decreased risk of diabetes. . Currently, no consensus exists regarding use of hemoglobin A1c for diagnosis of diabetes in children. . According to American Diabetes Association (ADA) guidelines, hemoglobin A1c <7.0% represents optimal control in non-pregnant diabetic patients. Different metrics may apply to specific patient populations.  Standards of Medical Care in Diabetes(ADA). [name]   09/02/2019 09:54 AM 5.3 <5.7 % of total Hgb Final    Comment:    For the purpose of screening for the presence of diabetes: . <5.7%       Consistent with the absence of diabetes 5.7-6.4%    Consistent with increased risk for diabetes             (prediabetes) > or =6.5%  Consistent with diabetes . This assay result is consistent with a decreased risk of diabetes. . Currently, no consensus exists regarding use of hemoglobin A1c for diagnosis of diabetes in children. . According to American Diabetes Association (ADA) guidelines, hemoglobin A1c <7.0% represents optimal control in non-pregnant diabetic patients. Different metrics may apply to  specific patient populations.  Standards of Medical Care in Diabetes(ADA). .     CBG: Recent Labs  Lab 07/31/20 1602 07/31/20 1850 07/31/20 2148 07/31/20 2232  GLUCAP 90 76 121* 101*    Recent Results (from the past 240 hour(s))  Resp Panel by RT-PCR (Flu A&B, Covid) Nasopharyngeal Swab     Status: None   Collection Time: 07/31/20  6:38 PM   Specimen: Nasopharyngeal Swab; Nasopharyngeal(NP) swabs in vial transport medium  Result Value Ref Range Status   SARS Coronavirus 2 by RT PCR NEGATIVE NEGATIVE Final    Comment: (NOTE) SARS-CoV-2 target nucleic acids are NOT DETECTED.  The SARS-CoV-2 RNA is generally detectable in upper respiratory specimens during the acute phase of infection. The lowest  concentration of SARS-CoV-2 viral copies this assay can detect is 138 copies/mL. A negative result does not preclude SARS-Cov-2 infection and should not be used as the sole basis for treatment or other patient management decisions. A negative result may occur with  improper specimen collection/handling, submission of specimen other than nasopharyngeal swab, presence of viral mutation(s) within the areas targeted by this assay, and inadequate number of viral copies(<138 copies/mL). A negative result must be combined with clinical observations, patient history, and epidemiological information. The expected result is Negative.  Fact Sheet for Patients:  EntrepreneurPulse.com.au  Fact Sheet for Healthcare Providers:  IncredibleEmployment.be  This test is no t yet approved or cleared by the [place] FDA and  has been authorized for detection and/or diagnosis of SARS-CoV-2 by FDA under an Emergency Use Authorization (EUA). This EUA will remain  in effect (meaning this test can be used) for the duration of the COVID-19 declaration under Section 564(b)(1) of the Act, 21 U.S.C.section 360bbb-3(b)(1), unless the authorization is terminated  or  revoked sooner.       Influenza A by PCR NEGATIVE NEGATIVE Final   Influenza B by PCR NEGATIVE NEGATIVE Final    Comment: (NOTE) The Xpert Xpress SARS-CoV-2/FLU/RSV plus assay is intended as an aid in the diagnosis of influenza from Nasopharyngeal swab specimens and should not be used as a sole basis for treatment. Nasal washings and aspirates are unacceptable for Xpert Xpress SARS-CoV-2/FLU/RSV testing.  Fact Sheet for Patients: EntrepreneurPulse.com.au  Fact Sheet for Healthcare Providers: IncredibleEmployment.be  This test is not yet approved or cleared by the [place] FDA and has been authorized for detection and/or diagnosis of SARS-CoV-2 by FDA under an Emergency Use Authorization (EUA). This EUA will remain in effect (meaning this test can be used) for the duration of the COVID-19 declaration under Section 564(b)(1) of the Act, 21 U.S.C. section 360bbb-3(b)(1), unless the authorization is terminated or revoked.  Performed at [facility], [place] [address] [place], [place] [address]      Scheduled Meds: . allopurinol  100 mg Oral Daily  . amLODipine  5 mg Oral Daily  . aspirin EC  81 mg Oral Daily  . atorvastatin  80 mg Oral Daily  . citalopram  20 mg Oral Daily  . metoprolol tartrate  50 mg Oral BID   Continuous Infusions: . sodium chloride 75 mL/hr at 07/31/20 2122  . heparin 1,200 Units/hr (08/01/20 0445)     LOS: 1 day   Cherene Altes, MD [facility] Office  [phone] Pager - Text Page per Shea Evans  If 7PM-7AM, please contact night-coverage per Amion 08/01/2020, 7:48 AM

## 2020-08-01 NOTE — Progress Notes (Addendum)
[place] for heparin Indication: chest pain/ACS  Allergies  Allergen Reactions  . Lisinopril Swelling and Other (See Comments)    Angioedema   . Losartan Potassium Swelling and Other (See Comments)    Angioedema   . Crestor [Rosuvastatin] Swelling and Other (See Comments)    Angioedema   . Tape Other (See Comments)    Irritates the skin    Patient Measurements:  Height: 5\' 8"  (172.7 cm) Weight: 70.9 kg (156 lb 4.9 oz) IBW/kg (Calculated) : 68.4 Heparin Dosing Weight: TBW  Vital Signs: Temp: 98.8 F (37.1 C) (04/20 1503) Temp Source: Oral (04/20 1503) BP: 136/63 (04/20 1503) Pulse Rate: 68 (04/20 1503)  Labs: Recent Labs    07/31/20 1540 07/31/20 1825 07/31/20 1838 07/31/20 2227 08/01/20 0320  HGB 13.8  --   --   --  11.1*  HCT 42.3  --   --   --  33.5*  PLT 421*  --   --   --  390  APTT  --   --  28  --   --   LABPROT  --   --  14.6  --   --   INR  --   --  1.1  --   --   HEPARINUNFRC  --   --   --  0.27* <0.10*  CREATININE 1.30*  --   --   --   --   TROPONINIHS 315* 316*  --  256* 175*    Estimated Creatinine Clearance: 38.7 mL/min (A) (by C-G formula based on SCr of 1.3 mg/dL (H)).  Assessment: Patient is an 87 y.o M presented to the ED on 4/19 with c/o left shoulder pain and s/p fall. Head CT showed "small right forehead scalp hematoma." Troponin was found to be elevated. On aspirin PTA but no anticoagulation.   He is now s/p cath and heparin to restart 8 hours post sheath removal. Last heparin rate was 1200 units/hr. Sheath removed ~ 2:40pm. Plans for PCI on 4/21   Goal of Therapy:  Heparin level 0.3-0.7 units/ml Monitor platelets by anticoagulation protocol: Yes   Plan:  - Resume heparin 1200 units/hr 8 hours after sheath removal (~ 11pm) -Heparin level in 8 hours and daily wth CBC daily  Hildred Laser, PharmD Clinical Pharmacist **Pharmacist phone directory can now be found on [email or web address] ([email or web address]).  Listed  under [place].

## 2020-08-01 NOTE — Progress Notes (Signed)
Patients belongings including cane and cell phone placed at nurses desk.. Son [name] called and will come to pick them up

## 2020-08-01 NOTE — Progress Notes (Signed)
[place] for heparin Indication: chest pain/ACS  Allergies  Allergen Reactions  . Lisinopril Swelling and Other (See Comments)    Angioedema   . Losartan Potassium Swelling and Other (See Comments)    Angioedema   . Crestor [Rosuvastatin] Swelling and Other (See Comments)    Angioedema   . Tape Other (See Comments)    Irritates the skin    Patient Measurements:  Height: 5\' 8"  (172.7 cm) Weight: 70.9 kg (156 lb 4.9 oz) IBW/kg (Calculated) : 68.4 Heparin Dosing Weight: TBW  Vital Signs: Temp: 97.7 F (36.5 C) (04/20 0228) Temp Source: Oral (04/19 2219) BP: 121/58 (04/20 0228) Pulse Rate: 65 (04/20 0228)  Labs: Recent Labs    07/31/20 1540 07/31/20 1825 07/31/20 1838 07/31/20 2227  HGB 13.8  --   --   --   HCT 42.3  --   --   --   PLT 421*  --   --   --   APTT  --   --  28  --   LABPROT  --   --  14.6  --   INR  --   --  1.1  --   HEPARINUNFRC  --   --   --  0.27*  CREATININE 1.30*  --   --   --   TROPONINIHS 315* 316*  --  256*    Estimated Creatinine Clearance: 38.7 mL/min (A) (by C-G formula based on SCr of 1.3 mg/dL (H)).  Assessment: Patient is an 87 y.o M presented to the ED on 4/19 with c/o left shoulder pain and s/p fall. Head CT showed "small right forehead scalp hematoma." Troponin was found to be elevated.   Pharmacy has been consulted to start heparin for suspected ACS.  On aspirin PTA but no anticoagulation.  Baseline CBC, aptt, INR all WNL.  08/01/2020:  Initial heparin level 0.27-->drawn after only 3 hours (vs 8h) so not at steady- state.    8h Heparin level <0.1 subtherapeutic on IV heparin 900 units/hr  CBC: Hg slightly low at 11.1, Pltc WNL  No bleeding or infusion related issues reported by RN  Goal of Therapy:  Heparin level 0.3-0.7 units/ml Monitor platelets by anticoagulation protocol: Yes   Plan:  - Rebolus heparin 2000 units IV x1 then increase heparin @ 1200 units/hr - Recheck 8 hr  heparin level  - Daily heparin level & CBC while on heparin - monitor for s/sx bleeding.  Netta Cedars PharmD, BCPS 08/01/2020,2:41 AM

## 2020-08-01 NOTE — Progress Notes (Signed)
  Hypoglycemic Event  CBG: 69  Treatment: D50 25 mL (12.5 gm)  Symptoms: None  Follow-up CBG: Time:1300 CBG Result:128  Possible Reasons for Event: Inadequate meal intake - NPO for cardiac cath  Comments/MD notified:Dr. Thereasa Solo made aware - resolved, no new orders    Marcell Anger, Marveen Reeks

## 2020-08-01 NOTE — Interval H&P Note (Signed)
Cath Lab Visit (complete for each Cath Lab visit)  Clinical Evaluation Leading to the Procedure:   ACS: Yes.    Non-ACS:    Anginal Classification: CCS II  Anti-ischemic medical therapy: Minimal Therapy (1 class of medications)  Non-Invasive Test Results: No non-invasive testing performed  Prior CABG: No previous CABG      History and Physical Interval Note:  08/01/2020 1:55 PM  Kevin Schwartz  has presented today for surgery, with the diagnosis of nstemi.  The various methods of treatment have been discussed with the patient and family. After consideration of risks, benefits and other options for treatment, the patient has consented to  Procedure(s): LEFT HEART CATH AND CORONARY ANGIOGRAPHY (N/A) as a surgical intervention.  The patient's history has been reviewed, patient examined, no change in status, stable for surgery.  I have reviewed the patient's chart and labs.  Questions were answered to the patient's satisfaction.     Shelva Majestic

## 2020-08-01 NOTE — Progress Notes (Signed)
  Echocardiogram 2D Echocardiogram has been performed.  Kevin Schwartz 08/01/2020, 7:04 PM

## 2020-08-01 NOTE — Consult Note (Addendum)
.  Cardiology Consultation:   Patient ID: Kevin Schwartz MRN: [id number]; DOB: Jun 22, 1930  Admit date: 07/31/2020 Date of Consult: 08/01/2020  PCP:  [name], MD   [place]  Cardiologist:  Glori Bickers, MD  Advanced Practice Provider:  No care team member to display Electrophysiologist:  None        Patient Profile:   Kevin Schwartz is a 87 y.o. male with a hx of DM-2, HTN, HL, mild renal insuff. RAS with stent to Lt renal artery 2008, CAD with hx multiple PCIs on OM-1, last cath 2007 now admitted 07/31/20 after presentation for Lt shoulder pain, that began day before and a fall who is being seen today for the evaluation of NSTEMI at the request of Dr. Thereasa Solo.  History of Present Illness:   [name] with above hx and CAD with hx of multiple PCIs on OM-1 last cath 2007.  Last myoview 2010, last renal 2/12 with nomral renals and aorta.  Last seen by Dr. Haroldine Laws in AHF 2018.  Echo at that time EF 55-60%, +DD, aortic valve mildly thickened and mild A!, no significant AS MV mildly thick.    Pt presented 07/31/20 with Lt shoulder pain, had started several days before.  He fell due to weakness in his legs. He tells me he was sitting on toilet and felt weak, had trouble getting up.  Then when he did get up and walk he was weak in his legs and became diaphoretic.  That bothered him and he fell up against wall.   No chest pain and no SOB.  He did have left shoulder pain.   Labs revealed hs troponin 300.  EKG SR no acute ST changes.  Pt was placed on IV heparin and echo ordered.   EKG:  The EKG was personally reviewed and demonstrates:  SR with non conducted PACs vs Mobitz 1 - initially HR 108 but today HR 46. No acute ST changes. Telemetry:  Telemetry was personally reviewed and demonstrates:  SR to SB with Mobitz 1 or non conducted PACs.   Hs troponin 315; 316; 256; 175   Chol 106, HDL 39 LDL 57  Hgb 11 plts 390 WBC 9.2 Na 138, K+ 4.2  Cr 1.30 T protein 9.5  Neg COVID 1V CXR :  IMPRESSION: Changes of mild vascular congestion. No other focal abnormality is Seen.  BP 125/64 P 48 to 70 R 22 to 14 and afebrile  Pt was on lopressor supposedly 50 BID but was on 100 daily Pt no longer on CPAP stopped using 3-4 years ago.  Past Medical History:  Diagnosis Date  . Anemia due to chronic kidney disease   . Bilateral inguinal hernia   . CAD (coronary artery disease) cardiologist-  dr bensimhon   PTCA of OM in 1998, [place] OM in 2000, Cath 9/07 LM ok LAD ok. LCX 95% in OM prior to previous stent RCA. nondominant normal EF normal. [place] DES to OM 2007 (PLACED PROXIAMAL TO PREVIOUS STENT)  . Chronic kidney disease, stage III (moderate) [facility])    nephrologist-  dr detarding  . Depression   . Diabetes mellitus type 2, diet-controlled ([place])    followed by pcp,  last A1c 5.2 on 09-10-2017 in epic  . Gout, unspecified    10-29-2017  per pt stable  . HLD (hyperlipidemia)   . HTN (hypertension)   . MGUS (monoclonal gammopathy of unknown significance) previously followed by dr Beryle Beams , lov and released  08/01/2011   IgG kappa dx 2002 8% plasma cells in bone marrow; no lesions on bone X-rays;  . Nocturia   . OA (osteoarthritis)   . OSA on CPAP    per study 01-30-2004  severe osa , AHI 51.6/hr  . PAD (peripheral artery disease) ([place])    05-21-2006  left renal artery stenosis, s/p balloon angioplasty and stenting;  last duplex 02/ 2012  normal , arteries patent  . S/P coronary artery stent placement    2000--  BMS x1  to OM;   2007-- DES x1  to OM proximal to previous stent  . Secondary hyperparathyroidism of renal origin ([place])   . Urgency of urination   . Wears glasses     Past Surgical History:  Procedure Laterality Date  . CARDIOVASCULAR STRESS TEST  2010   per dr bensimhon epic note dated 04-18-2016  normal  . [place]   PTCA to OM  . CORONARY ANGIOPLASTY WITH STENT PLACEMENT  2000   PCI and BMS x1 OM  .  CORONARY ANGIOPLASTY WITH STENT PLACEMENT  12-18-2005  dr Claiborne Billings   DES x1 to proximal to previously placed stent in OM  . HERNIA REPAIR    . INGUINAL HERNIA REPAIR Bilateral 10/30/2017   Procedure: LAPAROSCOPIC  BILATERAL INGUINAL HERNIA REPAIR  AND LEFT [place];  Surgeon: Michael Boston, MD;  Location: [facility];  Service: General;  Laterality: Bilateral;  . INSERTION OF MESH Bilateral 10/30/2017   Procedure: INSERTION OF MESH;  Surgeon: Michael Boston, MD;  Location: [place];  Service: General;  Laterality: Bilateral;  . RENAL ANGIOPLASTY Left 05-21-2006   dr berry   and stenting  . TOTAL KNEE ARTHROPLASTY Left 07-22-2005   dr Shellia Carwin  [facility]  . TRANSTHORACIC ECHOCARDIOGRAM  04/18/2016   ef 93-90%, grade 1 diastolic dysfunction/  mild to moderate AR/  mild MR     Home Medications:  Prior to Admission medications   Medication Sig Start Date End Date Taking? Authorizing Provider  allopurinol (ZYLOPRIM) 100 MG tablet Take 1 tablet (100 mg total) by mouth daily. 09/05/19  Yes Reed, Tiffany L, DO  amLODipine (NORVASC) 5 MG tablet Take 1 tablet (5 mg total) by mouth daily. 09/05/19  Yes Reed, Tiffany L, DO  aspirin EC 81 MG tablet Take 81 mg by mouth daily.   Yes [provider]  atorvastatin (LIPITOR) 80 MG tablet Take 1 tablet (80 mg total) by mouth daily. 09/05/19  Yes Reed, Tiffany L, DO  citalopram (CELEXA) 20 MG tablet Take 1 tablet (20 mg total) by mouth daily. 09/05/19  Yes Reed, Tiffany L, DO  diclofenac Sodium (VOLTAREN) 1 % GEL Apply 4 g topically 4 (four) times daily. Patient taking differently: Apply 4 g topically See admin instructions. Apply 4 grams to affected areas four times a day 06/18/20  Yes Reed, Tiffany L, DO  lidocaine (LIDODERM) 5 % Place 1 patch onto the skin daily. Remove & Discard patch within 12 hours or as directed by MD Patient taking differently: Place 1 patch onto the skin See admin instructions. Apply 1 patch to  the affected site once a day as needed for pain and remove & discard patch within 12 hours or as directed by MD 04/01/20  Yes Bero, Barth Kirks, MD  metoprolol tartrate (LOPRESSOR) 50 MG tablet Take 1 tablet (50 mg total) by mouth 2 (two) times daily. Patient taking differently: Take 100 mg by mouth in the morning. 09/05/19  Yes Reed, Tiffany L, DO  nitroGLYCERIN (NITROSTAT) 0.4 MG SL tablet DISSOLVE 1 TABLET UNDER THE TONGUE EVERY 5 MINUTES AS  NEEDED FOR CHEST PAIN. MAX  OF 3 TABLETS IN 15 MINUTES. CALL 911 IF PAIN PERSISTS. Patient taking differently: Place 0.4 mg under the tongue every 5 (five) minutes x 3 doses as needed for chest pain (AND CALL 9-1-1, IF PAIN PERSISTS). 06/06/20  Yes Reed, Tiffany L, DO  ACCU-CHEK SOFTCLIX LANCETS lancets Use to test blood sugar daily. Dx:E11.8 03/25/17   Reed, Tiffany L, DO  Alcohol Swabs (B-D SINGLE USE SWABS REGULAR) PADS Use with testing of blood sugar. Dx:E11.8 03/25/17   Reed, Tiffany L, DO  glucose blood (ACCU-CHEK AVIVA PLUS) test strip Use to check blood sugar daily. Dx:E11.8 09/05/19   Reed, Tiffany L, DO  ULTRA-THIN II MINI PEN NEEDLE 31G X 5 MM MISC  08/29/14   [provider]    Inpatient Medications: Scheduled Meds: . allopurinol  100 mg Oral Daily  . amLODipine  5 mg Oral Daily  . aspirin EC  81 mg Oral Daily  . atorvastatin  80 mg Oral Daily  . citalopram  20 mg Oral Daily   Continuous Infusions: . sodium chloride 75 mL/hr at 08/01/20 0937  . heparin 1,200 Units/hr (08/01/20 0445)   PRN Meds: acetaminophen, ALPRAZolam, nitroGLYCERIN, ondansetron (ZOFRAN) IV, zolpidem  Allergies:    Allergies  Allergen Reactions  . Lisinopril Swelling and Other (See Comments)    Angioedema   . Losartan Potassium Swelling and Other (See Comments)    Angioedema   . Crestor [Rosuvastatin] Swelling and Other (See Comments)    Angioedema   . Tape Other (See Comments)    Irritates the skin    Social History:   Social History    Socioeconomic History  . Marital status: Widowed    Spouse name: Not on file  . Number of children: 1  . Years of education: Not on file  . Highest education level: Not on file  Occupational History  . Occupation: retired   Tobacco Use  . Smoking status: Current Some Day Smoker    Packs/day: 0.25    Types: Cigarettes  . Smokeless tobacco: Never Used  . Tobacco comment: 10-29-2017  per pt was smoking 1pp2d, stopped June 2019  Vaping Use  . Vaping Use: Never used  Substance and Sexual Activity  . Alcohol use: Not Currently  . Drug use: No  . Sexual activity: Not on file  Other Topics Concern  . Not on file  Social History Narrative   Widowed 02/2012   Lives along   Stopped smoking 2004   Alcohol none   Exercise works in [place] Strain: Not on file  Food Insecurity: Not on file  Transportation Needs: Not on file  Physical Activity: Not on file  Stress: Not on file  Social Connections: Not on file  Intimate Partner Violence: Not on file    Family History:    Family History  Problem Relation Age of Onset  . Heart disease Mother   . Healthy Father   . Heart disease Sister   . Heart disease Sister   . Cancer Sister        type unknown  . Hypertension Sister   . Diabetes Brother   . Diabetes Brother   . Diabetes Other   . Coronary artery disease Other   .  Cancer Other      ROS:  Please see the history of present illness.  General:no colds or fevers, no weight changes Skin:no rashes or ulcers HEENT:no blurred vision, no congestion CV:see HPI PUL:see HPI GI:no diarrhea constipation or melena, no indigestion GU:no hematuria, no dysuria MS:no joint pain, no claudication Neuro:no syncope, no lightheadedness Endo:+ diabetes, no thyroid disease OSA on CPAP but no longer uses Hx renal stent last duplex in 2018   All other ROS reviewed and negative.     Physical Exam/Data:    Vitals:   07/31/20 2158 07/31/20 2219 08/01/20 0228 08/01/20 0627  BP:  123/61 (!) 121/58 125/64  Pulse:  69 65 62  Resp:  18 18 14   Temp: 98.1 F (36.7 C) 98 F (36.7 C) 97.7 F (36.5 C) 98.1 F (36.7 C)  TempSrc: Oral Oral    SpO2:  100% 98% 99%  Weight:  70.9 kg    Height:  5\' 8"  (1.727 m)      Intake/Output Summary (Last 24 hours) at 08/01/2020 1002 Last data filed at 08/01/2020 0937 Gross per 24 hour  Intake 1064.04 ml  Output --  Net 1064.04 ml   Last 3 Weights 07/31/2020 07/27/2020 06/18/2020  Weight (lbs) 156 lb 4.9 oz 165 lb 166 lb 6.4 oz  Weight (kg) 70.9 kg 74.844 kg 75.479 kg     Body mass index is 23.77 kg/m.  General:  Well nourished, well developed, in no acute distress, used to be a pt of Dr. Melvern Banker  HEENT: normal Lymph: no adenopathy Neck: no JVD Endocrine:  No thryomegaly Vascular: No carotid bruits; pedal pulses 1+ bilaterally   Cardiac:  normal S1, S2; RRR; no murmur gallup rub or click Lungs:  rhonchi to auscultation bilaterally, no wheezing or rales  Abd: soft, nontender, no hepatomegaly  Ext: tr edema of feet Musculoskeletal:  No deformities, BUE and BLE strength normal and equal Skin: warm and dry  Neuro:  Alert and oriented X 3 MAE follows commands intact, no focal abnormalities noted Psych:  Normal affect   Relevant CV Studies: Echo 2018  Study Conclusions   - Left ventricle: The cavity size was normal. Wall thickness was  normal. Systolic function was normal. The estimated ejection  fraction was in the range of 50% to 55%. Wall motion was normal;  there were no regional wall motion abnormalities. Doppler  parameters are consistent with abnormal left ventricular  relaxation (grade 1 diastolic dysfunction).  - Aortic valve: There was mild to moderate regurgitation.  - Mitral valve: There was mild regurgitation.   Laboratory Data:  High Sensitivity Troponin:   Recent Labs  Lab 07/31/20 1540 07/31/20 1825 07/31/20 2227  08/01/20 0320  TROPONINIHS 315* 316* 256* 175*     Chemistry Recent Labs  Lab 07/27/20 1215 07/31/20 1540  NA 136 138  K 4.3 4.2  CL 105 105  CO2 20* 21*  GLUCOSE 102* 91  BUN 19 19  CREATININE 1.54* 1.30*  CALCIUM 8.9 9.5  GFRNONAA 43* 53*  ANIONGAP 11 12    Recent Labs  Lab 07/27/20 1215 07/31/20 1540  PROT 7.7 9.5*  ALBUMIN 3.8 4.4  AST 31 40  ALT 26 22  ALKPHOS 75 79  BILITOT 1.9* 1.1   Hematology Recent Labs  Lab 07/27/20 1215 07/31/20 1540 08/01/20 0320  WBC 13.5* 13.3* 9.2  RBC 4.20* 4.47 3.58*  HGB 13.0 13.8 11.1*  HCT 39.8 42.3 33.5*  MCV 94.8 94.6 93.6  MCH 31.0 30.9 31.0  MCHC 32.7 32.6 33.1  RDW 14.3 13.6 13.6  PLT 339 421* 390   BNPNo results for input(s): BNP, PROBNP in the last 168 hours.  DDimer No results for input(s): DDIMER in the last 168 hours.   Radiology/Studies:  DG Chest 1 View  Result Date: 07/31/2020 CLINICAL DATA:  Recent fall EXAM: CHEST  1 VIEW COMPARISON:  07/30/2015 FINDINGS: Cardiac shadow is enlarged. Aortic calcifications are noted. The lungs are well aerated bilaterally with mild central vascular congestion. No focal infiltrate or effusion is seen. No bony abnormality is noted. IMPRESSION: Changes of mild vascular congestion. No other focal abnormality is seen. Electronically Signed   By: Inez Catalina M.D.   On: 07/31/2020 15:36   CT Head Wo Contrast  Result Date: 07/31/2020 CLINICAL DATA:  Dizziness EXAM: CT HEAD WITHOUT CONTRAST TECHNIQUE: Contiguous axial images were obtained from the base of the skull through the vertex without intravenous contrast. COMPARISON:  CT brain 09/30/2010 FINDINGS: Brain: No acute territorial infarction, hemorrhage, or intracranial mass. Moderate atrophy. Extensive white matter hypodensity most likely chronic small vessel ischemic change. Slightly prominent ventricles felt secondary to atrophy. Vascular: No hyperdense vessels.  Carotid vascular calcification. Skull: No fracture Sinuses/Orbits:  Left greater than right mastoid effusions. Other: Small right forehead scalp hematoma IMPRESSION: 1. No CT evidence for acute intracranial abnormality. Small right forehead scalp hematoma. 2. Atrophy and chronic small vessel ischemic changes of the white matter. 3. Left greater than right mastoid effusions. Electronically Signed   By: Donavan Foil M.D.   On: 07/31/2020 15:13   DG Shoulder Left  Result Date: 07/31/2020 CLINICAL DATA:  Left shoulder pain following recent fall, initial encounter EXAM: LEFT SHOULDER - 2+ VIEW COMPARISON:  None. FINDINGS: Degenerative changes of the acromioclavicular joint and glenohumeral joint are seen. No acute fracture or dislocation is noted. No soft tissue abnormality is seen. IMPRESSION: Degenerative change without acute abnormality. Electronically Signed   By: Inez Catalina M.D.   On: 07/31/2020 15:30     Assessment and Plan:   1. Elevated troponin in 300s, then decreasing, last cath 2007 -no chest pain. On IV heparin and ASA continue along with statin, hold BB for now due to #3.  Echo is pending. Depending on echo may need nuc study vs cath.    2. CAD with hx MI in 1998 and PCIs to OM several times.  Will try to obtain last cath  3. Arrhythmia with non conducted PACs vs Mobitz.  Have asked EP to review as well.  HR down to 48 - though hx of OSA may be adding to slower HR  Dr. Lovena Le reviewed and on most recent EKG does appear to be Mobitz, so stop BB, currently on hold- would stop and have him wear event monitor at home.  4. Fall due to leg weakness.  Was on BB now on hold.  Possible HR became too slow  5. HLD on lipitor 80 as outpt continue, LDL is < 70 which is goal 6. OSA no longer using CPAP for 3-4 years 7. DM-2 per IM 8. Mild to mod AR in 2018 recheck Echo  9. Hx renal artery stent on Left needs follow up dopplers, can be done as outpt, and this is followed by Dr. Gwenlyn Found.    Risk Assessment/Risk Scores:     TIMI Risk Score for Unstable Angina or  Non-ST Elevation MI:   The patient's TIMI risk score is 6, which indicates a 41% risk of all cause mortality, new or  recurrent myocardial infarction or need for urgent revascularization in the next 14 days.          For questions or updates, please contact [place] Please consult [email or web address] for contact info under    Signed, Cecilie Kicks, NP  08/01/2020 10:02 AM   Patient seen and examined with Cecilie Kicks PA-C.  Agree as above, with the following exceptions and changes as noted below. Mr. Markos is a very pleasant 87 yo male with CAD s/p PCI, DM2, HTN, HLD, prior RAS with stent to L renal artery 2008 who presents with diaphoresis, weakness and left shoulder pain, with a troponin elevation which may represent ACS. Gen: NAD, CV: RRR, no murmurs, Lungs: clear, Abd: soft, Extrem: Warm, well perfused, no edema, Neuro/Psych: alert and oriented x 3, normal mood and affect. All available labs, radiology testing, previous records reviewed.   Discussed both bradycardia and CAD as an etiology for his episode at home. He has a history of CAD with prior PCI and BMS in the OM, last cath in 2007. HbA1c is well controlled, but we discussed that over 15 years his disease could have progressed. He feels a repeat coronary angiogram is indicated to evaluate for possible obstructive CAD since he said the episode of home worried him. However, we also discussed that this could have been bradycardic due to possible Mobitz II second degree heart block - EP has reviewed as well and concurs. We will hold BB and monitor for improvement, and consider home cardiac monitor particularly if no obstructive CAD on cath.   INFORMED CONSENT: I have reviewed the risks, indications, and alternatives to cardiac catheterization, possible angioplasty, and stenting with the patient. Risks include but are not limited to bleeding, infection, vascular injury, stroke, myocardial infection, arrhythmia, kidney injury,  radiation-related injury in the case of prolonged fluoroscopy use, emergency cardiac surgery, and death. The patient understands the risks of serious complication is 1-2 in 5038 with diagnostic cardiac cath and 1-2% or less with angioplasty/stenting.    Elouise Munroe, MD 08/01/20 11:58 AM

## 2020-08-01 NOTE — H&P (View-Only) (Signed)
.  Cardiology Consultation:   Patient ID: Devun Anna MRN: [id number]; DOB: 15-Apr-1930  Admit date: 07/31/2020 Date of Consult: 08/01/2020  PCP:  [name], MD   [place]  Cardiologist:  Glori Bickers, MD  Advanced Practice Provider:  No care team member to display Electrophysiologist:  None        Patient Profile:   Ari Bernabei is a 87 y.o. male with a hx of DM-2, HTN, HL, mild renal insuff. RAS with stent to Lt renal artery 2008, CAD with hx multiple PCIs on OM-1, last cath 2007 now admitted 07/31/20 after presentation for Lt shoulder pain, that began day before and a fall who is being seen today for the evaluation of NSTEMI at the request of Dr. Thereasa Solo.  History of Present Illness:   Mr. Voris with above hx and CAD with hx of multiple PCIs on OM-1 last cath 2007.  Last myoview 2010, last renal 2/12 with nomral renals and aorta.  Last seen by Dr. Haroldine Laws in AHF 2018.  Echo at that time EF 55-60%, +DD, aortic valve mildly thickened and mild A!, no significant AS MV mildly thick.    Pt presented 07/31/20 with Lt shoulder pain, had started several days before.  He fell due to weakness in his legs. He tells me he was sitting on toilet and felt weak, had trouble getting up.  Then when he did get up and walk he was weak in his legs and became diaphoretic.  That bothered him and he fell up against wall.   No chest pain and no SOB.  He did have left shoulder pain.   Labs revealed hs troponin 300.  EKG SR no acute ST changes.  Pt was placed on IV heparin and echo ordered.   EKG:  The EKG was personally reviewed and demonstrates:  SR with non conducted PACs vs Mobitz 1 - initially HR 108 but today HR 46. No acute ST changes. Telemetry:  Telemetry was personally reviewed and demonstrates:  SR to SB with Mobitz 1 or non conducted PACs.   Hs troponin 315; 316; 256; 175   Chol 106, HDL 39 LDL 57  Hgb 11 plts 390 WBC 9.2 Na 138, K+ 4.2  Cr 1.30 T protein 9.5  Neg COVID 1V CXR :  IMPRESSION: Changes of mild vascular congestion. No other focal abnormality is Seen.  BP 125/64 P 48 to 70 R 22 to 14 and afebrile  Pt was on lopressor supposedly 50 BID but was on 100 daily Pt no longer on CPAP stopped using 3-4 years ago.  Past Medical History:  Diagnosis Date  . Anemia due to chronic kidney disease   . Bilateral inguinal hernia   . CAD (coronary artery disease) cardiologist-  dr bensimhon   PTCA of OM in 1998, [place] OM in 2000, Cath 9/07 LM ok LAD ok. LCX 95% in OM prior to previous stent RCA. nondominant normal EF normal. [place] DES to OM 2007 (PLACED PROXIAMAL TO PREVIOUS STENT)  . Chronic kidney disease, stage III (moderate) [facility])    nephrologist-  dr detarding  . Depression   . Diabetes mellitus type 2, diet-controlled ([place])    followed by pcp,  last A1c 5.2 on 09-10-2017 in epic  . Gout, unspecified    10-29-2017  per pt stable  . HLD (hyperlipidemia)   . HTN (hypertension)   . MGUS (monoclonal gammopathy of unknown significance) previously followed by dr Beryle Beams , lov and released  08/01/2011   IgG kappa dx 2002 8% plasma cells in bone marrow; no lesions on bone X-rays;  . Nocturia   . OA (osteoarthritis)   . OSA on CPAP    per study 01-30-2004  severe osa , AHI 51.6/hr  . PAD (peripheral artery disease) ([place])    05-21-2006  left renal artery stenosis, s/p balloon angioplasty and stenting;  last duplex 02/ 2012  normal , arteries patent  . S/P coronary artery stent placement    2000--  BMS x1  to OM;   2007-- DES x1  to OM proximal to previous stent  . Secondary hyperparathyroidism of renal origin ([place])   . Urgency of urination   . Wears glasses     Past Surgical History:  Procedure Laterality Date  . CARDIOVASCULAR STRESS TEST  2010   per dr bensimhon epic note dated 04-18-2016  normal  . [place]   PTCA to OM  . CORONARY ANGIOPLASTY WITH STENT PLACEMENT  2000   PCI and BMS x1 OM  .  CORONARY ANGIOPLASTY WITH STENT PLACEMENT  12-18-2005  dr Claiborne Billings   DES x1 to proximal to previously placed stent in OM  . HERNIA REPAIR    . INGUINAL HERNIA REPAIR Bilateral 10/30/2017   Procedure: LAPAROSCOPIC  BILATERAL INGUINAL HERNIA REPAIR  AND LEFT [place];  Surgeon: Michael Boston, MD;  Location: [place];  Service: General;  Laterality: Bilateral;  . INSERTION OF MESH Bilateral 10/30/2017   Procedure: INSERTION OF MESH;  Surgeon: Michael Boston, MD;  Location: [place];  Service: General;  Laterality: Bilateral;  . RENAL ANGIOPLASTY Left 05-21-2006   dr berry   and stenting  . TOTAL KNEE ARTHROPLASTY Left 07-22-2005   dr Shellia Carwin  [facility]  . TRANSTHORACIC ECHOCARDIOGRAM  04/18/2016   ef 79-02%, grade 1 diastolic dysfunction/  mild to moderate AR/  mild MR     Home Medications:  Prior to Admission medications   Medication Sig Start Date End Date Taking? Authorizing Provider  allopurinol (ZYLOPRIM) 100 MG tablet Take 1 tablet (100 mg total) by mouth daily. 09/05/19  Yes Reed, Tiffany L, DO  amLODipine (NORVASC) 5 MG tablet Take 1 tablet (5 mg total) by mouth daily. 09/05/19  Yes Reed, Tiffany L, DO  aspirin EC 81 MG tablet Take 81 mg by mouth daily.   Yes [provider]  atorvastatin (LIPITOR) 80 MG tablet Take 1 tablet (80 mg total) by mouth daily. 09/05/19  Yes Reed, Tiffany L, DO  citalopram (CELEXA) 20 MG tablet Take 1 tablet (20 mg total) by mouth daily. 09/05/19  Yes Reed, Tiffany L, DO  diclofenac Sodium (VOLTAREN) 1 % GEL Apply 4 g topically 4 (four) times daily. Patient taking differently: Apply 4 g topically See admin instructions. Apply 4 grams to affected areas four times a day 06/18/20  Yes Reed, Tiffany L, DO  lidocaine (LIDODERM) 5 % Place 1 patch onto the skin daily. Remove & Discard patch within 12 hours or as directed by MD Patient taking differently: Place 1 patch onto the skin See admin instructions. Apply 1 patch to  the affected site once a day as needed for pain and remove & discard patch within 12 hours or as directed by MD 04/01/20  Yes Bero, Barth Kirks, MD  metoprolol tartrate (LOPRESSOR) 50 MG tablet Take 1 tablet (50 mg total) by mouth 2 (two) times daily. Patient taking differently: Take 100 mg by mouth in the morning. 09/05/19  Yes Reed, Tiffany L, DO  nitroGLYCERIN (NITROSTAT) 0.4 MG SL tablet DISSOLVE 1 TABLET UNDER THE TONGUE EVERY 5 MINUTES AS  NEEDED FOR CHEST PAIN. MAX  OF 3 TABLETS IN 15 MINUTES. CALL 911 IF PAIN PERSISTS. Patient taking differently: Place 0.4 mg under the tongue every 5 (five) minutes x 3 doses as needed for chest pain (AND CALL 9-1-1, IF PAIN PERSISTS). 06/06/20  Yes Reed, Tiffany L, DO  ACCU-CHEK SOFTCLIX LANCETS lancets Use to test blood sugar daily. Dx:E11.8 03/25/17   Reed, Tiffany L, DO  Alcohol Swabs (B-D SINGLE USE SWABS REGULAR) PADS Use with testing of blood sugar. Dx:E11.8 03/25/17   Reed, Tiffany L, DO  glucose blood (ACCU-CHEK AVIVA PLUS) test strip Use to check blood sugar daily. Dx:E11.8 09/05/19   Reed, Tiffany L, DO  ULTRA-THIN II MINI PEN NEEDLE 31G X 5 MM MISC  08/29/14   [provider]    Inpatient Medications: Scheduled Meds: . allopurinol  100 mg Oral Daily  . amLODipine  5 mg Oral Daily  . aspirin EC  81 mg Oral Daily  . atorvastatin  80 mg Oral Daily  . citalopram  20 mg Oral Daily   Continuous Infusions: . sodium chloride 75 mL/hr at 08/01/20 0937  . heparin 1,200 Units/hr (08/01/20 0445)   PRN Meds: acetaminophen, ALPRAZolam, nitroGLYCERIN, ondansetron (ZOFRAN) IV, zolpidem  Allergies:    Allergies  Allergen Reactions  . Lisinopril Swelling and Other (See Comments)    Angioedema   . Losartan Potassium Swelling and Other (See Comments)    Angioedema   . Crestor [Rosuvastatin] Swelling and Other (See Comments)    Angioedema   . Tape Other (See Comments)    Irritates the skin    Social History:   Social History    Socioeconomic History  . Marital status: Widowed    Spouse name: Not on file  . Number of children: 1  . Years of education: Not on file  . Highest education level: Not on file  Occupational History  . Occupation: retired   Tobacco Use  . Smoking status: Current Some Day Smoker    Packs/day: 0.25    Types: Cigarettes  . Smokeless tobacco: Never Used  . Tobacco comment: 10-29-2017  per pt was smoking 1pp2d, stopped June 2019  Vaping Use  . Vaping Use: Never used  Substance and Sexual Activity  . Alcohol use: Not Currently  . Drug use: No  . Sexual activity: Not on file  Other Topics Concern  . Not on file  Social History Narrative   Widowed 02/2012   Lives along   Stopped smoking 2004   Alcohol none   Exercise works in [place] Strain: Not on file  Food Insecurity: Not on file  Transportation Needs: Not on file  Physical Activity: Not on file  Stress: Not on file  Social Connections: Not on file  Intimate Partner Violence: Not on file    Family History:    Family History  Problem Relation Age of Onset  . Heart disease Mother   . Healthy Father   . Heart disease Sister   . Heart disease Sister   . Cancer Sister        type unknown  . Hypertension Sister   . Diabetes Brother   . Diabetes Brother   . Diabetes Other   . Coronary artery disease Other   .  Cancer Other      ROS:  Please see the history of present illness.  General:no colds or fevers, no weight changes Skin:no rashes or ulcers HEENT:no blurred vision, no congestion CV:see HPI PUL:see HPI GI:no diarrhea constipation or melena, no indigestion GU:no hematuria, no dysuria MS:no joint pain, no claudication Neuro:no syncope, no lightheadedness Endo:+ diabetes, no thyroid disease OSA on CPAP but no longer uses Hx renal stent last duplex in 2018   All other ROS reviewed and negative.     Physical Exam/Data:    Vitals:   07/31/20 2158 07/31/20 2219 08/01/20 0228 08/01/20 0627  BP:  123/61 (!) 121/58 125/64  Pulse:  69 65 62  Resp:  18 18 14   Temp: 98.1 F (36.7 C) 98 F (36.7 C) 97.7 F (36.5 C) 98.1 F (36.7 C)  TempSrc: Oral Oral    SpO2:  100% 98% 99%  Weight:  70.9 kg    Height:  5\' 8"  (1.727 m)      Intake/Output Summary (Last 24 hours) at 08/01/2020 1002 Last data filed at 08/01/2020 0937 Gross per 24 hour  Intake 1064.04 ml  Output --  Net 1064.04 ml   Last 3 Weights 07/31/2020 07/27/2020 06/18/2020  Weight (lbs) 156 lb 4.9 oz 165 lb 166 lb 6.4 oz  Weight (kg) 70.9 kg 74.844 kg 75.479 kg     Body mass index is 23.77 kg/m.  General:  Well nourished, well developed, in no acute distress, used to be a pt of Dr. Melvern Banker  HEENT: normal Lymph: no adenopathy Neck: no JVD Endocrine:  No thryomegaly Vascular: No carotid bruits; pedal pulses 1+ bilaterally   Cardiac:  normal S1, S2; RRR; no murmur gallup rub or click Lungs:  rhonchi to auscultation bilaterally, no wheezing or rales  Abd: soft, nontender, no hepatomegaly  Ext: tr edema of feet Musculoskeletal:  No deformities, BUE and BLE strength normal and equal Skin: warm and dry  Neuro:  Alert and oriented X 3 MAE follows commands intact, no focal abnormalities noted Psych:  Normal affect   Relevant CV Studies: Echo 2018  Study Conclusions   - Left ventricle: The cavity size was normal. Wall thickness was  normal. Systolic function was normal. The estimated ejection  fraction was in the range of 50% to 55%. Wall motion was normal;  there were no regional wall motion abnormalities. Doppler  parameters are consistent with abnormal left ventricular  relaxation (grade 1 diastolic dysfunction).  - Aortic valve: There was mild to moderate regurgitation.  - Mitral valve: There was mild regurgitation.   Laboratory Data:  High Sensitivity Troponin:   Recent Labs  Lab 07/31/20 1540 07/31/20 1825 07/31/20 2227  08/01/20 0320  TROPONINIHS 315* 316* 256* 175*     Chemistry Recent Labs  Lab 07/27/20 1215 07/31/20 1540  NA 136 138  K 4.3 4.2  CL 105 105  CO2 20* 21*  GLUCOSE 102* 91  BUN 19 19  CREATININE 1.54* 1.30*  CALCIUM 8.9 9.5  GFRNONAA 43* 53*  ANIONGAP 11 12    Recent Labs  Lab 07/27/20 1215 07/31/20 1540  PROT 7.7 9.5*  ALBUMIN 3.8 4.4  AST 31 40  ALT 26 22  ALKPHOS 75 79  BILITOT 1.9* 1.1   Hematology Recent Labs  Lab 07/27/20 1215 07/31/20 1540 08/01/20 0320  WBC 13.5* 13.3* 9.2  RBC 4.20* 4.47 3.58*  HGB 13.0 13.8 11.1*  HCT 39.8 42.3 33.5*  MCV 94.8 94.6 93.6  MCH 31.0 30.9 31.0  MCHC 32.7 32.6 33.1  RDW 14.3 13.6 13.6  PLT 339 421* 390   BNPNo results for input(s): BNP, PROBNP in the last 168 hours.  DDimer No results for input(s): DDIMER in the last 168 hours.   Radiology/Studies:  DG Chest 1 View  Result Date: 07/31/2020 CLINICAL DATA:  Recent fall EXAM: CHEST  1 VIEW COMPARISON:  07/30/2015 FINDINGS: Cardiac shadow is enlarged. Aortic calcifications are noted. The lungs are well aerated bilaterally with mild central vascular congestion. No focal infiltrate or effusion is seen. No bony abnormality is noted. IMPRESSION: Changes of mild vascular congestion. No other focal abnormality is seen. Electronically Signed   By: Inez Catalina M.D.   On: 07/31/2020 15:36   CT Head Wo Contrast  Result Date: 07/31/2020 CLINICAL DATA:  Dizziness EXAM: CT HEAD WITHOUT CONTRAST TECHNIQUE: Contiguous axial images were obtained from the base of the skull through the vertex without intravenous contrast. COMPARISON:  CT brain 09/30/2010 FINDINGS: Brain: No acute territorial infarction, hemorrhage, or intracranial mass. Moderate atrophy. Extensive white matter hypodensity most likely chronic small vessel ischemic change. Slightly prominent ventricles felt secondary to atrophy. Vascular: No hyperdense vessels.  Carotid vascular calcification. Skull: No fracture Sinuses/Orbits:  Left greater than right mastoid effusions. Other: Small right forehead scalp hematoma IMPRESSION: 1. No CT evidence for acute intracranial abnormality. Small right forehead scalp hematoma. 2. Atrophy and chronic small vessel ischemic changes of the white matter. 3. Left greater than right mastoid effusions. Electronically Signed   By: Donavan Foil M.D.   On: 07/31/2020 15:13   DG Shoulder Left  Result Date: 07/31/2020 CLINICAL DATA:  Left shoulder pain following recent fall, initial encounter EXAM: LEFT SHOULDER - 2+ VIEW COMPARISON:  None. FINDINGS: Degenerative changes of the acromioclavicular joint and glenohumeral joint are seen. No acute fracture or dislocation is noted. No soft tissue abnormality is seen. IMPRESSION: Degenerative change without acute abnormality. Electronically Signed   By: Inez Catalina M.D.   On: 07/31/2020 15:30     Assessment and Plan:   1. Elevated troponin in 300s, then decreasing, last cath 2007 -no chest pain. On IV heparin and ASA continue along with statin, hold BB for now due to #3.  Echo is pending. Depending on echo may need nuc study vs cath.    2. CAD with hx MI in 1998 and PCIs to OM several times.  Will try to obtain last cath  3. Arrhythmia with non conducted PACs vs Mobitz.  Have asked EP to review as well.  HR down to 48 - though hx of OSA may be adding to slower HR  Dr. Lovena Le reviewed and on most recent EKG does appear to be Mobitz, so stop BB, currently on hold- would stop and have him wear event monitor at home.  4. Fall due to leg weakness.  Was on BB now on hold.  Possible HR became too slow  5. HLD on lipitor 80 as outpt continue, LDL is < 70 which is goal 6. OSA no longer using CPAP for 3-4 years 7. DM-2 per IM 8. Mild to mod AR in 2018 recheck Echo  9. Hx renal artery stent on Left needs follow up dopplers, can be done as outpt, and this is followed by Dr. Gwenlyn Found.    Risk Assessment/Risk Scores:     TIMI Risk Score for Unstable Angina or  Non-ST Elevation MI:   The patient's TIMI risk score is 6, which indicates a 41% risk of all cause mortality, new or  recurrent myocardial infarction or need for urgent revascularization in the next 14 days.          For questions or updates, please contact [place] Please consult [email or web address] for contact info under    Signed, Cecilie Kicks, NP  08/01/2020 10:02 AM   Patient seen and examined with Cecilie Kicks PA-C.  Agree as above, with the following exceptions and changes as noted below. Mr. Byington is a very pleasant 86 yo male with CAD s/p PCI, DM2, HTN, HLD, prior RAS with stent to L renal artery 2008 who presents with diaphoresis, weakness and left shoulder pain, with a troponin elevation which may represent ACS. Gen: NAD, CV: RRR, no murmurs, Lungs: clear, Abd: soft, Extrem: Warm, well perfused, no edema, Neuro/Psych: alert and oriented x 3, normal mood and affect. All available labs, radiology testing, previous records reviewed.   Discussed both bradycardia and CAD as an etiology for his episode at home. He has a history of CAD with prior PCI and BMS in the OM, last cath in 2007. HbA1c is well controlled, but we discussed that over 15 years his disease could have progressed. He feels a repeat coronary angiogram is indicated to evaluate for possible obstructive CAD since he said the episode of home worried him. However, we also discussed that this could have been bradycardic due to possible Mobitz II second degree heart block - EP has reviewed as well and concurs. We will hold BB and monitor for improvement, and consider home cardiac monitor particularly if no obstructive CAD on cath.   INFORMED CONSENT: I have reviewed the risks, indications, and alternatives to cardiac catheterization, possible angioplasty, and stenting with the patient. Risks include but are not limited to bleeding, infection, vascular injury, stroke, myocardial infection, arrhythmia, kidney injury,  radiation-related injury in the case of prolonged fluoroscopy use, emergency cardiac surgery, and death. The patient understands the risks of serious complication is 1-2 in 4132 with diagnostic cardiac cath and 1-2% or less with angioplasty/stenting.    Elouise Munroe, MD 08/01/20 11:58 AM

## 2020-08-01 NOTE — Progress Notes (Signed)
Pt alert and aware in bed. He states he is waiting for the doctor to come and change his diet. Other than that he is doing okay. The chaplain offered caring and supportive presence, prayers and blessings.

## 2020-08-02 ENCOUNTER — Encounter (HOSPITAL_COMMUNITY)
Admission: EM | Disposition: A | Discharge status: Home Health | Payer: Self-pay | Source: Home / Self Care | Attending: Internal Medicine

## 2020-08-02 ENCOUNTER — Encounter (HOSPITAL_COMMUNITY): Payer: Self-pay | Admitting: Cardiovascular Disease

## 2020-08-02 DIAGNOSIS — I2510 Atherosclerotic heart disease of native coronary artery without angina pectoris: Secondary | ICD-10-CM | POA: Diagnosis not present

## 2020-08-02 DIAGNOSIS — R778 Other specified abnormalities of plasma proteins: Secondary | ICD-10-CM

## 2020-08-02 DIAGNOSIS — I214 Non-ST elevation (NSTEMI) myocardial infarction: Secondary | ICD-10-CM | POA: Diagnosis not present

## 2020-08-02 HISTORY — PX: CORONARY BALLOON ANGIOPLASTY: CATH118233

## 2020-08-02 HISTORY — PX: CORONARY STENT INTERVENTION: CATH118234

## 2020-08-02 LAB — CBC
HCT: 30.2 % — ABNORMAL LOW (ref 39.0–52.0)
Hemoglobin: 10 g/dL — ABNORMAL LOW (ref 13.0–17.0)
MCH: 30.9 pg (ref 26.0–34.0)
MCHC: 33.1 g/dL (ref 30.0–36.0)
MCV: 93.2 fL (ref 80.0–100.0)
Platelets: 418 10*3/uL — ABNORMAL HIGH (ref 150–400)
RBC: 3.24 MIL/uL — ABNORMAL LOW (ref 4.22–5.81)
RDW: 13.6 % (ref 11.5–15.5)
WBC: 7.7 10*3/uL (ref 4.0–10.5)
nRBC: 0 % (ref 0.0–0.2)

## 2020-08-02 LAB — BASIC METABOLIC PANEL
Anion gap: 7 (ref 5–15)
BUN: 14 mg/dL (ref 8–23)
CO2: 21 mmol/L — ABNORMAL LOW (ref 22–32)
Calcium: 8.2 mg/dL — ABNORMAL LOW (ref 8.9–10.3)
Chloride: 110 mmol/L (ref 98–111)
Creatinine, Ser: 1.29 mg/dL — ABNORMAL HIGH (ref 0.61–1.24)
GFR, Estimated: 54 mL/min — ABNORMAL LOW (ref >60)
Glucose, Bld: 87 mg/dL (ref 70–99)
Potassium: 3.7 mmol/L (ref 3.5–5.1)
Sodium: 138 mmol/L (ref 135–145)

## 2020-08-02 LAB — GLUCOSE, CAPILLARY
Glucose-Capillary: 84 mg/dL (ref 70–99)
Glucose-Capillary: 78 mg/dL (ref 70–99)
Glucose-Capillary: 74 mg/dL (ref 70–99)
Glucose-Capillary: 73 mg/dL (ref 70–99)
Glucose-Capillary: 54 mg/dL — ABNORMAL LOW (ref 70–99)
Glucose-Capillary: 118 mg/dL — ABNORMAL HIGH (ref 70–99)

## 2020-08-02 LAB — ECHOCARDIOGRAM COMPLETE
Area-P 1/2: 3.65 cm2
Height: 68 in
P 1/2 time: 573 msec
S' Lateral: 3.4 cm
Weight: 2500.9 oz

## 2020-08-02 LAB — POCT ACTIVATED CLOTTING TIME
Activated Clotting Time: 356 seconds
Activated Clotting Time: 327 seconds
Activated Clotting Time: 255 seconds

## 2020-08-02 LAB — HEPARIN LEVEL (UNFRACTIONATED): Heparin Unfractionated: <0.1 IU/mL — ABNORMAL LOW (ref 0.30–0.70)

## 2020-08-02 SURGERY — CORONARY STENT INTERVENTION
Anesthesia: LOCAL

## 2020-08-02 MED ORDER — SODIUM CHLORIDE 0.9 % WEIGHT BASED INFUSION: 1.0000 mL/kg/h | INTRAVENOUS | Status: DC

## 2020-08-02 MED ORDER — FENTANYL CITRATE (PF) 100 MCG/2ML IJ SOLN
INTRAMUSCULAR | Status: AC
  Filled 2020-08-02: qty 2

## 2020-08-02 MED ORDER — LIDOCAINE HCL (PF) 1 % IJ SOLN
INTRAMUSCULAR | Status: AC
  Filled 2020-08-02: qty 30

## 2020-08-02 MED ORDER — DIAZEPAM 5 MG PO TABS: 5.0000 mg | ORAL_TABLET | ORAL | Status: DC | PRN

## 2020-08-02 MED ORDER — SODIUM CHLORIDE 0.9 % IV SOLN: 250.0000 mL | INTRAVENOUS | Status: DC | PRN

## 2020-08-02 MED ORDER — ASPIRIN 81 MG PO CHEW
81.0000 mg | CHEWABLE_TABLET | ORAL | Status: AC
  Administered 2020-08-02: 81 mg via ORAL
  Filled 2020-08-02: qty 1

## 2020-08-02 MED ORDER — SODIUM CHLORIDE 0.9% FLUSH: 3.0000 mL | Freq: Two times a day (BID) | INTRAVENOUS | Status: DC

## 2020-08-02 MED ORDER — IOHEXOL 350 MG/ML SOLN
INTRAVENOUS | Status: AC
  Filled 2020-08-02: qty 1

## 2020-08-02 MED ORDER — VERAPAMIL HCL 2.5 MG/ML IV SOLN
INTRAVENOUS | Status: DC | PRN
  Administered 2020-08-02: 10 mL via INTRA_ARTERIAL

## 2020-08-02 MED ORDER — ENOXAPARIN SODIUM 40 MG/0.4ML ~~LOC~~ SOLN
40.0000 mg | SUBCUTANEOUS | Status: DC
  Administered 2020-08-03: 40 mg via SUBCUTANEOUS
  Filled 2020-08-02: qty 0.4

## 2020-08-02 MED ORDER — HEPARIN SODIUM (PORCINE) 1000 UNIT/ML IJ SOLN
INTRAMUSCULAR | Status: DC | PRN
  Administered 2020-08-02: 7600 [IU] via INTRAVENOUS
  Administered 2020-08-02: 2000 [IU] via INTRAVENOUS

## 2020-08-02 MED ORDER — SODIUM CHLORIDE 0.9 % IV SOLN: INTRAVENOUS | Status: DC

## 2020-08-02 MED ORDER — IOHEXOL 350 MG/ML SOLN
INTRAVENOUS | Status: DC | PRN
  Administered 2020-08-02: 205 mL

## 2020-08-02 MED ORDER — DEXTROSE 50 % IV SOLN
INTRAVENOUS | Status: AC
  Administered 2020-08-02: 50 mL
  Filled 2020-08-02: qty 50

## 2020-08-02 MED ORDER — FAMOTIDINE IN NACL 20-0.9 MG/50ML-% IV SOLN
INTRAVENOUS | Status: AC
  Filled 2020-08-02: qty 50

## 2020-08-02 MED ORDER — FENTANYL CITRATE (PF) 100 MCG/2ML IJ SOLN
INTRAMUSCULAR | Status: DC | PRN
  Administered 2020-08-02: 12.5 ug via INTRAVENOUS
  Administered 2020-08-02: 25 ug via INTRAVENOUS

## 2020-08-02 MED ORDER — SODIUM CHLORIDE 0.9% FLUSH: 3.0000 mL | INTRAVENOUS | Status: DC | PRN

## 2020-08-02 MED ORDER — FAMOTIDINE IN NACL 20-0.9 MG/50ML-% IV SOLN
INTRAVENOUS | Status: AC | PRN
  Administered 2020-08-02: 20 mg via INTRAVENOUS

## 2020-08-02 MED ORDER — MIDAZOLAM HCL 2 MG/2ML IJ SOLN
INTRAMUSCULAR | Status: AC
  Filled 2020-08-02: qty 2

## 2020-08-02 MED ORDER — HEPARIN SODIUM (PORCINE) 1000 UNIT/ML IJ SOLN
INTRAMUSCULAR | Status: AC
  Filled 2020-08-02: qty 1

## 2020-08-02 MED ORDER — HEPARIN (PORCINE) IN NACL 1000-0.9 UT/500ML-% IV SOLN
INTRAVENOUS | Status: AC
  Filled 2020-08-02: qty 1000

## 2020-08-02 MED ORDER — CLOPIDOGREL BISULFATE 75 MG PO TABS
ORAL_TABLET | ORAL | Status: AC
  Filled 2020-08-02: qty 1

## 2020-08-02 MED ORDER — ATORVASTATIN CALCIUM 80 MG PO TABS: 80.0000 mg | ORAL_TABLET | Freq: Every day | ORAL | Status: DC

## 2020-08-02 MED ORDER — LABETALOL HCL 5 MG/ML IV SOLN: 10.0000 mg | INTRAVENOUS | Status: AC | PRN

## 2020-08-02 MED ORDER — HEPARIN (PORCINE) IN NACL 1000-0.9 UT/500ML-% IV SOLN
INTRAVENOUS | Status: DC | PRN
  Administered 2020-08-02 (×2): 500 mL

## 2020-08-02 MED ORDER — MIDAZOLAM HCL 2 MG/2ML IJ SOLN
INTRAMUSCULAR | Status: DC | PRN
Start: 2020-08-02 — End: 2020-08-02
  Administered 2020-08-02 (×2): 1 mg via INTRAVENOUS

## 2020-08-02 MED ORDER — HYDRALAZINE HCL 20 MG/ML IJ SOLN: 10.0000 mg | INTRAMUSCULAR | Status: AC | PRN

## 2020-08-02 MED ORDER — VERAPAMIL HCL 2.5 MG/ML IV SOLN
INTRAVENOUS | Status: AC
  Filled 2020-08-02: qty 2

## 2020-08-02 MED ORDER — ASPIRIN 81 MG PO CHEW
81.0000 mg | CHEWABLE_TABLET | Freq: Every day | ORAL | Status: DC
  Administered 2020-08-03: 81 mg via ORAL
  Filled 2020-08-02: qty 1

## 2020-08-02 MED ORDER — CLOPIDOGREL BISULFATE 75 MG PO TABS
75.0000 mg | ORAL_TABLET | Freq: Every day | ORAL | Status: DC
  Administered 2020-08-03: 75 mg via ORAL
  Filled 2020-08-02: qty 1

## 2020-08-02 MED ORDER — NITROGLYCERIN 1 MG/10 ML FOR IR/CATH LAB
INTRA_ARTERIAL | Status: DC | PRN
  Administered 2020-08-02 (×3): 200 ug via INTRACORONARY

## 2020-08-02 MED ORDER — ACETAMINOPHEN 325 MG PO TABS: 650.0000 mg | ORAL_TABLET | ORAL | Status: DC | PRN

## 2020-08-02 MED ORDER — CLOPIDOGREL BISULFATE 75 MG PO TABS
ORAL_TABLET | ORAL | Status: DC | PRN
  Administered 2020-08-02: 75 mg via ORAL

## 2020-08-02 MED ORDER — ONDANSETRON HCL 4 MG/2ML IJ SOLN: 4.0000 mg | Freq: Four times a day (QID) | INTRAMUSCULAR | Status: DC | PRN

## 2020-08-02 MED ORDER — LIDOCAINE HCL (PF) 1 % IJ SOLN
INTRAMUSCULAR | Status: DC | PRN
  Administered 2020-08-02: 2 mL via INTRADERMAL

## 2020-08-02 MED ORDER — SODIUM CHLORIDE 0.9 % WEIGHT BASED INFUSION
3.0000 mL/kg/h | INTRAVENOUS | Status: AC
  Administered 2020-08-02: 3 mL/kg/h via INTRAVENOUS

## 2020-08-02 MED ORDER — NITROGLYCERIN 1 MG/10 ML FOR IR/CATH LAB
INTRA_ARTERIAL | Status: AC
  Filled 2020-08-02: qty 10

## 2020-08-02 MED FILL — Fentanyl Citrate Preservative Free (PF) Inj 100 MCG/2ML: INTRAMUSCULAR | Qty: 2 | Status: AC

## 2020-08-02 SURGICAL SUPPLY — 24 items
BALLN SAPPHIRE 2.5X12 (BALLOONS) ×2
BALLN SAPPHIRE ~~LOC~~ 3.0X15 (BALLOONS) ×2 IMPLANT
BALLN WOLVERINE 2.50X10 (BALLOONS) ×2
BALLN ~~LOC~~ EMERGE MR 2.5X12 (BALLOONS) ×2
BALLOON SAPPHIRE 2.5X12 (BALLOONS) ×1 IMPLANT
BALLOON WOLVERINE 2.50X10 (BALLOONS) ×1 IMPLANT
BALLOON ~~LOC~~ EMERGE MR 2.5X12 (BALLOONS) ×1 IMPLANT
CATH LAUNCHER 6FR EBU3.5 (CATHETERS) ×2 IMPLANT
CATH VISTA GUIDE 6FR XBLAD3.5 (CATHETERS) ×2 IMPLANT
DEVICE RAD COMP TR BAND LRG (VASCULAR PRODUCTS) ×2 IMPLANT
ELECT DEFIB PAD ADLT CADENCE (PAD) ×2 IMPLANT
GLIDESHEATH SLEND SS 6F .021 (SHEATH) ×2 IMPLANT
GUIDEWIRE INQWIRE 1.5J.035X260 (WIRE) ×1 IMPLANT
INQWIRE 1.5J .035X260CM (WIRE) ×2
KIT ENCORE 26 ADVANTAGE (KITS) ×2 IMPLANT
KIT HEART LEFT (KITS) ×2 IMPLANT
MAT PREVALON FULL STRYKER (MISCELLANEOUS) ×2 IMPLANT
PACK CARDIAC CATHETERIZATION (CUSTOM PROCEDURE TRAY) ×2 IMPLANT
SHEATH PROBE COVER 6X72 (BAG) ×2 IMPLANT
STENT RESOLUTE ONYX 2.5X15 (Permanent Stent) ×2 IMPLANT
TRANSDUCER W/STOPCOCK (MISCELLANEOUS) ×2 IMPLANT
TUBING CIL FLEX 10 FLL-RA (TUBING) ×2 IMPLANT
WIRE COUGAR XT STRL 190CM (WIRE) ×2 IMPLANT
WIRE HI TORQ WHISPER MS 190CM (WIRE) ×2 IMPLANT

## 2020-08-02 NOTE — Progress Notes (Signed)
PROGRESS NOTE  Kevin Schwartz:[id number] DOB: 18-Dec-1930 DOA: 07/31/2020 PCP: [name], MD  HPI/Recap of past 24 hours: 87yo w/ a history of CAD s/p PCI, DM2, RAS, CKD stage II, HTN, HLD, ACD who presented to the ED 4/19 with left shoulder pain.  He also reported an episode the morning of his presentation in which he suddenly felt weak and dizzy/lightheaded and fell to the ground on his right side.  There was no loss of consciousness or head trauma.  In the ED an EKG was without obvious ischemic change but his initial troponin was elevated at 300.  Cardiology was consulted and the patient was admitted to the acute unit. Transferred to [facility] on 08/01/20 for planned Left heart cath.  Significant Events:  4/19 admission 4/20 tx to [facility] for cardiac cath: multivessel CAD (see report) - plan for intervention on previously stented circ marginal vessel and mid LAD 4/21.  08/02/20: Patient was seen and examined at his bedside this morning.  He denies have any chest pain, palpitations or dyspnea at the time of this visit.  Plan for heart cath today.  Assessment/Plan: Active Problems:   NSTEMI (non-ST elevated myocardial infarction) (HCC)   Elevated troponin   NSTEMI Presented with troponin greater than 300 Currently on heparin drip. Seen by cardiology with plan as stated above. 2D echo done on 08/01/2020 showed LVEF 45 to 50% with global hypokinesis involving the left ventricle. He was transferred from [place] to [facility] on 08/01/2020 for heart cath.  CAD s/p PCI with coronary stent intervention on 08/02/2020. Management per cardiology Presently on aspirin and high-dose statin, Lipitor 80 mg daily. Rest of management per cardiology. Denies any anginal symptoms at the time of this visit.  Acute blood loss anemia Presented with hemoglobin of 13, downtrending to 10.0. No overt bleeding Continue to monitor H&H  CKD 3A Baseline creatinine appears to be  1.2 with GFR 54 Avoid nephrotoxic agents and dehydration. On IV fluid to avoid contrast-induced nephropathy.  Chronic anxiety/depression Resume home Celexa  Essential hypertension Resume home regimen.  Gout Stable resume home allopurinol   Code Status: Full code.  Family Communication: None at bedside.  Disposition Plan: DC to home when cardiology signs of possible fall 2020 222.   Consultants:  Cardiology.  Procedures:  Heart cath.  Antimicrobials:  None.  DVT prophylaxis: Heparin drip.  Status is: Inpatient    Dispo: The patient is from: Home.              Anticipated d/c is to: Home.                Patient currently, not stable for discharge due to ongoing management of NSTEMI   Difficult to place patient, not applicable.        Objective: Vitals:   08/02/20 0313 08/02/20 0756 08/02/20 0832 08/02/20 1153  BP: (!) 152/76 140/68 132/78 140/64  Pulse: 76 72  71  Resp: 20 18  16   Temp: 98.8 F (37.1 C) 98 F (36.7 C)  98.8 F (37.1 C)  TempSrc: Oral Oral  Oral  SpO2: 99% 99%  100%  Weight: 76.1 kg     Height:        Intake/Output Summary (Last 24 hours) at 08/02/2020 1546 Last data filed at 08/02/2020 1154 Gross per 24 hour  Intake 916.94 ml  Output 1250 ml  Net -333.06 ml   Filed Weights   07/31/20 2219 08/02/20 0313  Weight: 70.9  kg 76.1 kg    Exam:  . General: 87 y.o. year-old male well developed well nourished in no acute distress.  Alert and oriented x3. . Cardiovascular: Regular rate and rhythm with no rubs or gallops.  No thyromegaly or JVD noted.   [name] Respiratory: Clear to auscultation with no wheezes or rales. Good inspiratory effort. . Abdomen: Soft nontender nondistended with normal bowel sounds x4 quadrants. . Musculoskeletal: No lower extremity edema. 2/4 pulses in all 4 extremities. . Skin: No ulcerative lesions noted or rashes, . Psychiatry: Mood is appropriate for condition and setting   Data Reviewed: CBC: Recent  Labs  Lab 07/27/20 1215 07/31/20 1540 08/01/20 0320 08/02/20 0638  WBC 13.5* 13.3* 9.2 7.7  NEUTROABS 12.0* 11.1*  --   --   HGB 13.0 13.8 11.1* 10.0*  HCT 39.8 42.3 33.5* 30.2*  MCV 94.8 94.6 93.6 93.2  PLT 339 421* 390 884*   Basic Metabolic Panel: Recent Labs  Lab 07/27/20 1215 07/31/20 1540 08/02/20 0638  NA 136 138 138  K 4.3 4.2 3.7  CL 105 105 110  CO2 20* 21* 21*  GLUCOSE 102* 91 87  BUN 19 19 14   CREATININE 1.54* 1.30* 1.29*  CALCIUM 8.9 9.5 8.2*   GFR: Estimated Creatinine Clearance: 39 mL/min (A) (by C-G formula based on SCr of 1.29 mg/dL (H)). Liver Function Tests: Recent Labs  Lab 07/27/20 1215 07/31/20 1540  AST 31 40  ALT 26 22  ALKPHOS 75 79  BILITOT 1.9* 1.1  PROT 7.7 9.5*  ALBUMIN 3.8 4.4   No results for input(s): LIPASE, AMYLASE in the last 168 hours. No results for input(s): AMMONIA in the last 168 hours. Coagulation Profile: Recent Labs  Lab 07/31/20 1838  INR 1.1   Cardiac Enzymes: No results for input(s): CKTOTAL, CKMB, CKMBINDEX, TROPONINI in the last 168 hours. BNP (last 3 results) No results for input(s): PROBNP in the last 8760 hours. HbA1C: No results for input(s): HGBA1C in the last 72 hours. CBG: Recent Labs  Lab 08/01/20 1747 08/01/20 2110 08/02/20 0752 08/02/20 1057 08/02/20 1150  GLUCAP 95 84 78 74 73   Lipid Profile: Recent Labs    08/01/20 0320  CHOL 106  HDL 39*  LDLCALC 57  TRIG 50  CHOLHDL 2.7   Thyroid Function Tests: Recent Labs    07/31/20 2227  TSH 2.483  FREET4 0.96   Anemia Panel: No results for input(s): VITAMINB12, FOLATE, FERRITIN, TIBC, IRON, RETICCTPCT in the last 72 hours. Urine analysis:    Component Value Date/Time   COLORURINE YELLOW 07/31/2020 1743   APPEARANCEUR CLEAR 07/31/2020 1743   LABSPEC 1.018 07/31/2020 1743   PHURINE 5.0 07/31/2020 1743   GLUCOSEU NEGATIVE 07/31/2020 1743   HGBUR SMALL (A) 07/31/2020 [place] 07/31/2020 1743   KETONESUR  NEGATIVE 07/31/2020 1743   PROTEINUR >=300 (A) 07/31/2020 1743   NITRITE NEGATIVE 07/31/2020 1743   LEUKOCYTESUR NEGATIVE 07/31/2020 1743   Sepsis Labs: @LABRCNTIP (procalcitonin:4,lacticidven:4)  ) Recent Results (from the past 240 hour(s))  Resp Panel by RT-PCR (Flu A&B, Covid) Nasopharyngeal Swab     Status: None   Collection Time: 07/31/20  6:38 PM   Specimen: Nasopharyngeal Swab; Nasopharyngeal(NP) swabs in vial transport medium  Result Value Ref Range Status   SARS Coronavirus 2 by RT PCR NEGATIVE NEGATIVE Final    Comment: (NOTE) SARS-CoV-2 target nucleic acids are NOT DETECTED.  The SARS-CoV-2 RNA is generally detectable in upper respiratory specimens during the acute phase of infection. The  lowest concentration of SARS-CoV-2 viral copies this assay can detect is 138 copies/mL. A negative result does not preclude SARS-Cov-2 infection and should not be used as the sole basis for treatment or other patient management decisions. A negative result may occur with  improper specimen collection/handling, submission of specimen other than nasopharyngeal swab, presence of viral mutation(s) within the areas targeted by this assay, and inadequate number of viral copies(<138 copies/mL). A negative result must be combined with clinical observations, patient history, and epidemiological information. The expected result is Negative.  Fact Sheet for Patients:  EntrepreneurPulse.com.au  Fact Sheet for Healthcare Providers:  IncredibleEmployment.be  This test is no t yet approved or cleared by the [place] FDA and  has been authorized for detection and/or diagnosis of SARS-CoV-2 by FDA under an Emergency Use Authorization (EUA). This EUA will remain  in effect (meaning this test can be used) for the duration of the COVID-19 declaration under Section 564(b)(1) of the Act, 21 U.S.C.section 360bbb-3(b)(1), unless the authorization is terminated  or  revoked sooner.       Influenza A by PCR NEGATIVE NEGATIVE Final   Influenza B by PCR NEGATIVE NEGATIVE Final    Comment: (NOTE) The Xpert Xpress SARS-CoV-2/FLU/RSV plus assay is intended as an aid in the diagnosis of influenza from Nasopharyngeal swab specimens and should not be used as a sole basis for treatment. Nasal washings and aspirates are unacceptable for Xpert Xpress SARS-CoV-2/FLU/RSV testing.  Fact Sheet for Patients: EntrepreneurPulse.com.au  Fact Sheet for Healthcare Providers: IncredibleEmployment.be  This test is not yet approved or cleared by the [place] FDA and has been authorized for detection and/or diagnosis of SARS-CoV-2 by FDA under an Emergency Use Authorization (EUA). This EUA will remain in effect (meaning this test can be used) for the duration of the COVID-19 declaration under Section 564(b)(1) of the Act, 21 U.S.C. section 360bbb-3(b)(1), unless the authorization is terminated or revoked.  Performed at [facility], [place] [address], [place], [place] [address]       Studies: ECHOCARDIOGRAM COMPLETE  Result Date: 08/02/2020    ECHOCARDIOGRAM REPORT   Patient Name:   Kevin Schwartz [facility] Date of Exam: 08/01/2020 Medical Rec #:  [id number]               Height:       68.0 in Accession #:    [id number]              Weight:       156.3 lb Date of Birth:  07/09/1930                BSA:          1.840 m Patient Age:    24 years                BP:           142/85 mmHg Patient Gender: M                       HR:           78 bpm. Exam Location:  Inpatient Procedure: 2D Echo, Cardiac Doppler, Color Doppler and Intracardiac            Opacification Agent Indications:    Acute Myocardial Infarction I21.9  History:        Patient has prior history of Echocardiogram examinations, most  recent 04/18/2016. CAD, PAD; Risk Factors:Hypertension, Diabetes,                 Dyslipidemia and Sleep Apnea.  CKD.  Sonographer:    Tiffany Dance Referring Phys: [id number] [place]  1. Left ventricular ejection fraction, by estimation, is 45 to 50%. The left ventricle has mildly decreased function. The left ventricle demonstrates global hypokinesis. There is moderate concentric left ventricular hypertrophy. Left ventricular diastolic function could not be evaluated. There is hypokinesis of the left ventricular, basal-mid anteroseptal wall.  2. Right ventricular systolic function is normal. The right ventricular size is normal. There is normal pulmonary artery systolic pressure. The estimated right ventricular systolic pressure is 62.2 mmHg.  3. The mitral valve is degenerative. Mild mitral valve regurgitation. No evidence of mitral stenosis.  4. The aortic valve is tricuspid. There is moderate calcification of the aortic valve. There is moderate thickening of the aortic valve. Aortic valve regurgitation is mild. Mild to moderate aortic valve sclerosis/calcification is present, without any evidence of aortic stenosis. Aortic regurgitation PHT measures 573 msec.  5. The inferior vena cava is dilated in size with >50% respiratory variability, suggesting right atrial pressure of 8 mmHg.  6. A small pericardial effusion is present. The pericardial effusion is posterior to the left ventricle. There is no evidence of cardiac tamponade. FINDINGS  Left Ventricle: Left ventricular ejection fraction, by estimation, is 45 to 50%. The left ventricle has mildly decreased function. The left ventricle demonstrates global hypokinesis. The left ventricular internal cavity size was normal in size. There is  moderate concentric left ventricular hypertrophy. Left ventricular diastolic function could not be evaluated. Right Ventricle: The right ventricular size is normal. No increase in right ventricular wall thickness. Right ventricular systolic function is normal. There is normal pulmonary artery systolic pressure. The  tricuspid regurgitant velocity is 2.25 m/s, and  with an assumed right atrial pressure of 8 mmHg, the estimated right ventricular systolic pressure is 29.7 mmHg. Left Atrium: Left atrial size was normal in size. Right Atrium: Right atrial size was normal in size. Pericardium: A small pericardial effusion is present. The pericardial effusion is posterior to the left ventricle. There is no evidence of cardiac tamponade. Mitral Valve: The mitral valve is degenerative in appearance. There is mild thickening of the mitral valve leaflet(s). There is mild calcification of the mitral valve leaflet(s). Mild to moderate mitral annular calcification. Mild mitral valve regurgitation. No evidence of mitral valve stenosis. Tricuspid Valve: The tricuspid valve is normal in structure. Tricuspid valve regurgitation is mild . No evidence of tricuspid stenosis. Aortic Valve: The aortic valve is tricuspid. There is moderate calcification of the aortic valve. There is moderate thickening of the aortic valve. Aortic valve regurgitation is mild. Aortic regurgitation PHT measures 573 msec. Mild to moderate aortic valve sclerosis/calcification is present, without any evidence of aortic stenosis. Pulmonic Valve: The pulmonic valve was normal in structure. Pulmonic valve regurgitation is not visualized. No evidence of pulmonic stenosis. Aorta: The aortic root is normal in size and structure. Venous: The inferior vena cava is dilated in size with greater than 50% respiratory variability, suggesting right atrial pressure of 8 mmHg. IAS/Shunts: No atrial level shunt detected by color flow Doppler.  LEFT VENTRICLE PLAX 2D LVIDd:         4.40 cm LVIDs:         3.40 cm LV PW:         1.50 cm LV IVS:  1.30 cm LVOT diam:     1.80 cm LV SV:         47 LV SV Index:   26 LVOT Area:     2.54 cm  RIGHT VENTRICLE          IVC RV Basal diam:  2.80 cm  IVC diam: 2.10 cm TAPSE (M-mode): 1.3 cm LEFT ATRIUM             Index       RIGHT ATRIUM            Index LA diam:        4.40 cm 2.39 cm/m  RA Area:     15.20 cm LA Vol (A2C):   43.7 ml 23.74 ml/m RA Volume:   35.60 ml  19.34 ml/m LA Vol (A4C):   56.3 ml 30.59 ml/m LA Biplane Vol: 50.2 ml 27.28 ml/m  AORTIC VALVE LVOT Vmax:   98.15 cm/s LVOT Vmean:  71.100 cm/s LVOT VTI:    0.186 m AI PHT:      573 msec  AORTA Ao Root diam: 3.30 cm Ao Asc diam:  3.30 cm MITRAL VALVE                TRICUSPID VALVE MV Area (PHT): 3.65 cm     TR Peak grad:   20.2 mmHg MV Decel Time: 208 msec     TR Vmax:        225.00 cm/s MV E velocity: 120.00 cm/s MV A velocity: 103.00 cm/s  SHUNTS MV E/A ratio:  1.17         Systemic VTI:  0.19 m                             Systemic Diam: 1.80 cm Fransico Him MD Electronically signed by Fransico Him MD Signature Date/Time: 08/02/2020/11:36:09 AM    Final     Scheduled Meds: . [MAR Hold] allopurinol  100 mg Oral Daily  . [MAR Hold] amLODipine  5 mg Oral Daily  . [MAR Hold] aspirin EC  81 mg Oral Daily  . [MAR Hold] atorvastatin  80 mg Oral Daily  . [MAR Hold] citalopram  20 mg Oral Daily  . [MAR Hold] sodium chloride flush  3 mL Intravenous Q12H  . [MAR Hold] sodium chloride flush  3 mL Intravenous Q12H  . sodium chloride flush  3 mL Intravenous Q12H    Continuous Infusions: . [MAR Hold] sodium chloride    . sodium chloride    . sodium chloride 1 mL/kg/hr (08/02/20 0612)  . famotidine    . heparin 1,400 Units/hr (08/02/20 1118)     LOS: 2 days     Kayleen Memos, MD [facility] Pager [phone]  If 7PM-7AM, please contact night-coverage [email or web address] Password [facility] 08/02/2020, 3:46 PM

## 2020-08-02 NOTE — Progress Notes (Addendum)
Progress Note  Patient Name: Kevin Schwartz Date of Encounter: 08/02/2020  [facility] Cardiologist: Glori Bickers, MD   Subjective   No chest pain no SOB SR no further mobitz  Inpatient Medications    Scheduled Meds: . allopurinol  100 mg Oral Daily  . amLODipine  5 mg Oral Daily  . aspirin EC  81 mg Oral Daily  . atorvastatin  80 mg Oral Daily  . citalopram  20 mg Oral Daily  . sodium chloride flush  3 mL Intravenous Q12H  . sodium chloride flush  3 mL Intravenous Q12H  . sodium chloride flush  3 mL Intravenous Q12H   Continuous Infusions: . sodium chloride 75 mL/hr at 08/02/20 0804  . sodium chloride 0 mL/hr at 08/01/20 2217  . sodium chloride    . sodium chloride    . sodium chloride 1 mL/kg/hr (08/02/20 0612)  . heparin 1,400 Units/hr (08/02/20 1118)   PRN Meds: sodium chloride, sodium chloride, acetaminophen, diazepam, nitroGLYCERIN, ondansetron (ZOFRAN) IV, sodium chloride flush, sodium chloride flush, zolpidem   Vital Signs    Vitals:   08/02/20 0313 08/02/20 0756 08/02/20 0832 08/02/20 1153  BP: (!) 152/76 140/68 132/78 140/64  Pulse: 76 72  71  Resp: 20 18  16   Temp: 98.8 F (37.1 C) 98 F (36.7 C)  98.8 F (37.1 C)  TempSrc: Oral Oral  Oral  SpO2: 99% 99%  100%  Weight: 76.1 kg     Height:        Intake/Output Summary (Last 24 hours) at 08/02/2020 1229 Last data filed at 08/02/2020 1154 Gross per 24 hour  Intake 916.94 ml  Output 1250 ml  Net -333.06 ml   Last 3 Weights 08/02/2020 07/31/2020 07/27/2020  Weight (lbs) 167 lb 12.3 oz 156 lb 4.9 oz 165 lb  Weight (kg) 76.1 kg 70.9 kg 74.844 kg      Telemetry    SR no mobitz seen today - Personally Reviewed  ECG    SR with PVC and 1st degree AV block.  - Personally Reviewed  Physical Exam   GEN: No acute distress.   Neck: No JVD Cardiac: RRR, no murmurs, rubs, or gallops. Rt wrist cath site stable no hematoma Respiratory: Clear to auscultation bilaterally. GI: Soft,  nontender, non-distended  MS: No edema; No deformity. Neuro:  Nonfocal  Psych: Normal affect   Labs    High Sensitivity Troponin:   Recent Labs  Lab 07/31/20 1540 07/31/20 1825 07/31/20 2227 08/01/20 0320  TROPONINIHS 315* 316* 256* 175*      Chemistry Recent Labs  Lab 07/27/20 1215 07/31/20 1540 08/02/20 0638  NA 136 138 138  K 4.3 4.2 3.7  CL 105 105 110  CO2 20* 21* 21*  GLUCOSE 102* 91 87  BUN 19 19 14   CREATININE 1.54* 1.30* 1.29*  CALCIUM 8.9 9.5 8.2*  PROT 7.7 9.5*  --   ALBUMIN 3.8 4.4  --   AST 31 40  --   ALT 26 22  --   ALKPHOS 75 79  --   BILITOT 1.9* 1.1  --   GFRNONAA 43* 53* 54*  ANIONGAP 11 12 7      Hematology Recent Labs  Lab 07/31/20 1540 08/01/20 0320 08/02/20 0638  WBC 13.3* 9.2 7.7  RBC 4.47 3.58* 3.24*  HGB 13.8 11.1* 10.0*  HCT 42.3 33.5* 30.2*  MCV 94.6 93.6 93.2  MCH 30.9 31.0 30.9  MCHC 32.6 33.1 33.1  RDW 13.6 13.6 13.6  PLT  421* 390 418*    BNPNo results for input(s): BNP, PROBNP in the last 168 hours.   DDimer No results for input(s): DDIMER in the last 168 hours.   Radiology    DG Chest 1 View  Result Date: 07/31/2020 CLINICAL DATA:  Recent fall EXAM: CHEST  1 VIEW COMPARISON:  07/30/2015 FINDINGS: Cardiac shadow is enlarged. Aortic calcifications are noted. The lungs are well aerated bilaterally with mild central vascular congestion. No focal infiltrate or effusion is seen. No bony abnormality is noted. IMPRESSION: Changes of mild vascular congestion. No other focal abnormality is seen. Electronically Signed   By: Inez Catalina M.D.   On: 07/31/2020 15:36   CT Head Wo Contrast  Result Date: 07/31/2020 CLINICAL DATA:  Dizziness EXAM: CT HEAD WITHOUT CONTRAST TECHNIQUE: Contiguous axial images were obtained from the base of the skull through the vertex without intravenous contrast. COMPARISON:  CT brain 09/30/2010 FINDINGS: Brain: No acute territorial infarction, hemorrhage, or intracranial mass. Moderate atrophy.  Extensive white matter hypodensity most likely chronic small vessel ischemic change. Slightly prominent ventricles felt secondary to atrophy. Vascular: No hyperdense vessels.  Carotid vascular calcification. Skull: No fracture Sinuses/Orbits: Left greater than right mastoid effusions. Other: Small right forehead scalp hematoma IMPRESSION: 1. No CT evidence for acute intracranial abnormality. Small right forehead scalp hematoma. 2. Atrophy and chronic small vessel ischemic changes of the white matter. 3. Left greater than right mastoid effusions. Electronically Signed   By: Donavan Foil M.D.   On: 07/31/2020 15:13   CARDIAC CATHETERIZATION  Result Date: 08/01/2020  Prox RCA lesion is 85% stenosed.  Mid RCA-1 lesion is 50% stenosed.  Mid RCA-2 lesion is 85% stenosed.  Dist RCA lesion is 90% stenosed.  Mid LAD-1 lesion is 40% stenosed.  Mid LAD-2 lesion is 80% stenosed.  Mid Cx to Dist Cx lesion is 20% stenosed.  LPAV lesion is 40% stenosed.  1st Sept lesion is 80% stenosed.  Lat 2nd Mrg lesion is 90% stenosed.  2nd Mrg-1 lesion is 40% stenosed.  2nd Mrg-2 lesion is 80% stenosed.  Multivessel CAD with coronary calcification.  The LAD had 3% stenosis in the proximal to mid segment and a focal 80% mid stenosis; the large circumflex marginal vessel has smooth 40% proximal stenosis and there is focal 80% in-stent restenosis in the circumflex stent.  There is distal branch stenosis of 90% and a very small caliber branch of this marginal vessel.  The AV groove circumflex has mild 20 and 40% stenoses; the right coronary artery is a small caliber nondominant tortuous vessel that has focal segmental stenoses of 85 to 90% in the midsegment, 80 and 90% distally with small caliber distal vessel. LVEDP 12 mmHg. RECOMMENDATION: With the patient's age of 87 years, and stage IIIb CKD with creatinine clearance at 39, recommend hydration overnight.  We will resume heparin therapy 8 hours post sheath pull.  Angiograms  were reviewed with Dr. Angelena Form.  Will initiate oral Plavix tomorrow morning with plans for two-vessel coronary intervention tomorrow following hydration involving the in-stent restenosis of the circumflex marginal vessel and mid LAD.  We will initially plan for medical therapy to the RCA which is diffusely diseased.   DG Shoulder Left  Result Date: 07/31/2020 CLINICAL DATA:  Left shoulder pain following recent fall, initial encounter EXAM: LEFT SHOULDER - 2+ VIEW COMPARISON:  None. FINDINGS: Degenerative changes of the acromioclavicular joint and glenohumeral joint are seen. No acute fracture or dislocation is noted. No soft tissue abnormality is seen. IMPRESSION:  Degenerative change without acute abnormality. Electronically Signed   By: Inez Catalina M.D.   On: 07/31/2020 15:30   ECHOCARDIOGRAM COMPLETE  Result Date: 08/02/2020    ECHOCARDIOGRAM REPORT   Patient Name:   MATAI CARPENITO [facility] Date of Exam: 08/01/2020 Medical Rec #:  [id number]               Height:       68.0 in Accession #:    [id number]              Weight:       156.3 lb Date of Birth:  1930-10-31                BSA:          1.840 m Patient Age:    81 years                BP:           142/85 mmHg Patient Gender: M                       HR:           78 bpm. Exam Location:  Inpatient Procedure: 2D Echo, Cardiac Doppler, Color Doppler and Intracardiac            Opacification Agent Indications:    Acute Myocardial Infarction I21.9  History:        Patient has prior history of Echocardiogram examinations, most                 recent 04/18/2016. CAD, PAD; Risk Factors:Hypertension, Diabetes,                 Dyslipidemia and Sleep Apnea. CKD.  Sonographer:    Tiffany Dance Referring Phys: [id number] [place]  1. Left ventricular ejection fraction, by estimation, is 45 to 50%. The left ventricle has mildly decreased function. The left ventricle demonstrates global hypokinesis. There is moderate concentric left ventricular  hypertrophy. Left ventricular diastolic function could not be evaluated. There is hypokinesis of the left ventricular, basal-mid anteroseptal wall.  2. Right ventricular systolic function is normal. The right ventricular size is normal. There is normal pulmonary artery systolic pressure. The estimated right ventricular systolic pressure is 56.3 mmHg.  3. The mitral valve is degenerative. Mild mitral valve regurgitation. No evidence of mitral stenosis.  4. The aortic valve is tricuspid. There is moderate calcification of the aortic valve. There is moderate thickening of the aortic valve. Aortic valve regurgitation is mild. Mild to moderate aortic valve sclerosis/calcification is present, without any evidence of aortic stenosis. Aortic regurgitation PHT measures 573 msec.  5. The inferior vena cava is dilated in size with >50% respiratory variability, suggesting right atrial pressure of 8 mmHg.  6. A small pericardial effusion is present. The pericardial effusion is posterior to the left ventricle. There is no evidence of cardiac tamponade. FINDINGS  Left Ventricle: Left ventricular ejection fraction, by estimation, is 45 to 50%. The left ventricle has mildly decreased function. The left ventricle demonstrates global hypokinesis. The left ventricular internal cavity size was normal in size. There is  moderate concentric left ventricular hypertrophy. Left ventricular diastolic function could not be evaluated. Right Ventricle: The right ventricular size is normal. No increase in right ventricular wall thickness. Right ventricular systolic function is normal. There is normal pulmonary artery systolic pressure. The tricuspid regurgitant velocity is 2.25 m/s, and  with an assumed right atrial pressure  of 8 mmHg, the estimated right ventricular systolic pressure is 42.3 mmHg. Left Atrium: Left atrial size was normal in size. Right Atrium: Right atrial size was normal in size. Pericardium: A small pericardial effusion is  present. The pericardial effusion is posterior to the left ventricle. There is no evidence of cardiac tamponade. Mitral Valve: The mitral valve is degenerative in appearance. There is mild thickening of the mitral valve leaflet(s). There is mild calcification of the mitral valve leaflet(s). Mild to moderate mitral annular calcification. Mild mitral valve regurgitation. No evidence of mitral valve stenosis. Tricuspid Valve: The tricuspid valve is normal in structure. Tricuspid valve regurgitation is mild . No evidence of tricuspid stenosis. Aortic Valve: The aortic valve is tricuspid. There is moderate calcification of the aortic valve. There is moderate thickening of the aortic valve. Aortic valve regurgitation is mild. Aortic regurgitation PHT measures 573 msec. Mild to moderate aortic valve sclerosis/calcification is present, without any evidence of aortic stenosis. Pulmonic Valve: The pulmonic valve was normal in structure. Pulmonic valve regurgitation is not visualized. No evidence of pulmonic stenosis. Aorta: The aortic root is normal in size and structure. Venous: The inferior vena cava is dilated in size with greater than 50% respiratory variability, suggesting right atrial pressure of 8 mmHg. IAS/Shunts: No atrial level shunt detected by color flow Doppler.  LEFT VENTRICLE PLAX 2D LVIDd:         4.40 cm LVIDs:         3.40 cm LV PW:         1.50 cm LV IVS:        1.30 cm LVOT diam:     1.80 cm LV SV:         47 LV SV Index:   26 LVOT Area:     2.54 cm  RIGHT VENTRICLE          IVC RV Basal diam:  2.80 cm  IVC diam: 2.10 cm TAPSE (M-mode): 1.3 cm LEFT ATRIUM             Index       RIGHT ATRIUM           Index LA diam:        4.40 cm 2.39 cm/m  RA Area:     15.20 cm LA Vol (A2C):   43.7 ml 23.74 ml/m RA Volume:   35.60 ml  19.34 ml/m LA Vol (A4C):   56.3 ml 30.59 ml/m LA Biplane Vol: 50.2 ml 27.28 ml/m  AORTIC VALVE LVOT Vmax:   98.15 cm/s LVOT Vmean:  71.100 cm/s LVOT VTI:    0.186 m AI PHT:      573  msec  AORTA Ao Root diam: 3.30 cm Ao Asc diam:  3.30 cm MITRAL VALVE                TRICUSPID VALVE MV Area (PHT): 3.65 cm     TR Peak grad:   20.2 mmHg MV Decel Time: 208 msec     TR Vmax:        225.00 cm/s MV E velocity: 120.00 cm/s MV A velocity: 103.00 cm/s  SHUNTS MV E/A ratio:  1.17         Systemic VTI:  0.19 m                             Systemic Diam: 1.80 cm Fransico Him MD Electronically signed by Fransico Him MD Signature Date/Time:  08/02/2020/11:36:09 AM    Final     Cardiac Studies   Echo 08/01/20  IMPRESSIONS    1. Left ventricular ejection fraction, by estimation, is 45 to 50%. The  left ventricle has mildly decreased function. The left ventricle  demonstrates global hypokinesis. There is moderate concentric left  ventricular hypertrophy. Left ventricular  diastolic function could not be evaluated. There is hypokinesis of the  left ventricular, basal-mid anteroseptal wall.  2. Right ventricular systolic function is normal. The right ventricular  size is normal. There is normal pulmonary artery systolic pressure. The  estimated right ventricular systolic pressure is 64.3 mmHg.  3. The mitral valve is degenerative. Mild mitral valve regurgitation. No  evidence of mitral stenosis.  4. The aortic valve is tricuspid. There is moderate calcification of the  aortic valve. There is moderate thickening of the aortic valve. Aortic  valve regurgitation is mild. Mild to moderate aortic valve  sclerosis/calcification is present, without any  evidence of aortic stenosis. Aortic regurgitation PHT measures 573 msec.  5. The inferior vena cava is dilated in size with >50% respiratory  variability, suggesting right atrial pressure of 8 mmHg.  6. A small pericardial effusion is present. The pericardial effusion is  posterior to the left ventricle. There is no evidence of cardiac  tamponade.   FINDINGS  Left Ventricle: Left ventricular ejection fraction, by estimation, is 45  to  50%. The left ventricle has mildly decreased function. The left  ventricle demonstrates global hypokinesis. The left ventricular internal  cavity size was normal in size. There is  moderate concentric left ventricular hypertrophy. Left ventricular  diastolic function could not be evaluated.   Right Ventricle: The right ventricular size is normal. No increase in  right ventricular wall thickness. Right ventricular systolic function is  normal. There is normal pulmonary artery systolic pressure. The tricuspid  regurgitant velocity is 2.25 m/s, and  with an assumed right atrial pressure of 8 mmHg, the estimated right  ventricular systolic pressure is 32.9 mmHg.   Left Atrium: Left atrial size was normal in size.   Right Atrium: Right atrial size was normal in size.   Pericardium: A small pericardial effusion is present. The pericardial  effusion is posterior to the left ventricle. There is no evidence of  cardiac tamponade.   Mitral Valve: The mitral valve is degenerative in appearance. There is  mild thickening of the mitral valve leaflet(s). There is mild  calcification of the mitral valve leaflet(s). Mild to moderate mitral  annular calcification. Mild mitral valve  regurgitation. No evidence of mitral valve stenosis.   Tricuspid Valve: The tricuspid valve is normal in structure. Tricuspid  valve regurgitation is mild . No evidence of tricuspid stenosis.   Aortic Valve: The aortic valve is tricuspid. There is moderate  calcification of the aortic valve. There is moderate thickening of the  aortic valve. Aortic valve regurgitation is mild. Aortic regurgitation PHT  measures 573 msec. Mild to moderate aortic  valve sclerosis/calcification is present, without any evidence of aortic  stenosis.     Cardiac Cath 08/01/20   Prox RCA lesion is 85% stenosed.  Mid RCA-1 lesion is 50% stenosed.  Mid RCA-2 lesion is 85% stenosed.  Dist RCA lesion is 90% stenosed.  Mid LAD-1  lesion is 40% stenosed.  Mid LAD-2 lesion is 80% stenosed.  Mid Cx to Dist Cx lesion is 20% stenosed.  LPAV lesion is 40% stenosed.  1st Sept lesion is 80% stenosed.  Lat 2nd Mrg lesion  is 90% stenosed.  2nd Mrg-1 lesion is 40% stenosed.  2nd Mrg-2 lesion is 80% stenosed.   Multivessel CAD with coronary calcification.  The LAD had 3% stenosis in the proximal to mid segment and a focal 80% mid stenosis; the large circumflex marginal vessel has smooth 40% proximal stenosis and there is focal 80% in-stent restenosis in the circumflex stent.  There is distal branch stenosis of 90% and a very small caliber branch of this marginal vessel.  The AV groove circumflex has mild 20 and 40% stenoses; the right coronary artery is a small caliber nondominant tortuous vessel that has focal segmental stenoses of 85 to 90% in the midsegment, 80 and 90% distally with small caliber distal vessel.   LVEDP 12 mmHg.  RECOMMENDATION: With the patient's age of 81 years, and stage IIIb CKD with creatinine clearance at 39, recommend hydration overnight.  We will resume heparin therapy 8 hours post sheath pull.  Angiograms were reviewed with Dr. Angelena Form.  Will initiate oral Plavix tomorrow morning with plans for two-vessel coronary intervention tomorrow following hydration involving the in-stent restenosis of the circumflex marginal vessel and mid LAD.  We will initially plan for medical therapy to the RCA which is diffusely diseased.    Patient Profile     87 y.o. male with a hx of DM-2, HTN, HL, mild renal insuff. RAS with stent to Lt renal artery 2008, CAD with hx multiple PCIs on OM-1, last cath 2007 now admitted 07/31/20 after presentation for Lt shoulder pain, that began day before and a fall admitted with NSTEMI. Cath 08/01/20 and today PCI.  Assessment & Plan     NSTEMI/Elevated troponin in 300s, then decreasing, last cath 2007 -no chest pain. On IV heparin and ASA continue along with statin, hold BB  for now due to #3.  Cardiac cath with  LAD had 3% stenosis in the proximal to mid segment and a focal 80% mid stenosis; the large circumflex marginal vessel has smooth 40% proximal stenosis and there is focal 80% in-stent restenosis in the circumflex stent.  There is distal branch stenosis of 90% and a very small caliber branch of this marginal vessel.  The AV groove circumflex has mild 20 and 40% stenoses; the right coronary artery is a small caliber nondominant tortuous vessel that has focal segmental stenoses of 85 to 90% in the midsegment, 80 and 90% distally with small caliber distal vessel.   Arrhythmia with non conducted PACs vs Mobitz.  Have asked EP to review as well.  HR down to 48 - though hx of OSA may be adding to slower HR  Dr. Lovena Le reviewed and on most recent EKG does appear to be Mobitz, so stop BB, currently on hold- would stop and have him wear event monitor at home.   Fall due to leg weakness.  Was on BB now on hold.  Possible HR became too slow    HLD on lipitor 80 as outpt continue, LDL is < 70 which is goal  OSA no longer using CPAP for 3-4 years DM-2 per IM  Mild to mod AR in 2018 recheck Echo   Hx renal artery stent on Left needs follow up dopplers, can be done as outpt, and this is followed by Dr. Gwenlyn Found.  --cr 1.29 stable post cath for procedure today      For questions or updates, please contact [place] Please consult [email or web address] for contact info under        Signed, Cecilie Kicks, NP  08/02/2020, 12:29 PM   Patient seen and examined with Cecilie Kicks NP.  Agree as above, with the following exceptions and changes as noted below. Patient for cath today. Gen: NAD, CV: RRR, no murmurs, Lungs: clear, Abd: soft, Extrem: Warm, well perfused, no edema, Neuro/Psych: alert and oriented x 3, normal mood and affect. All available labs, radiology testing, previous records reviewed.  Plan for staged procedure today with intervention.  Elouise Munroe, MD 08/02/20  5:59 PM

## 2020-08-02 NOTE — Progress Notes (Signed)
Pt with 2.7 sec pause pt stable will continue to monitor BB was stopped on admit due to bradycardia

## 2020-08-02 NOTE — Interval H&P Note (Signed)
Cath Lab Visit (complete for each Cath Lab visit)  Clinical Evaluation Leading to the Procedure:   ACS: Yes.    Non-ACS:    Anginal Classification: CCS II  Anti-ischemic medical therapy: Minimal Therapy (1 class of medications)  Non-Invasive Test Results: No non-invasive testing performed  Prior CABG: No previous CABG      History and Physical Interval Note:  08/02/2020 1:55 PM  Kevin Schwartz  has presented today for surgery, with the diagnosis of cad.  The various methods of treatment have been discussed with the patient and family. After consideration of risks, benefits and other options for treatment, the patient has consented to  Procedure(s): CORONARY STENT INTERVENTION (N/A) as a surgical intervention.  The patient's history has been reviewed, patient examined, no change in status, stable for surgery.  I have reviewed the patient's chart and labs.  Questions were answered to the patient's satisfaction.     Shelva Majestic

## 2020-08-02 NOTE — Plan of Care (Signed)
  Problem: Health Behavior/Discharge Planning: Goal: Ability to manage health-related needs will improve Outcome: Progressing   Problem: Clinical Measurements: Goal: Ability to maintain clinical measurements within normal limits will improve Outcome: Progressing Goal: Will remain free from infection Outcome: Progressing Goal: Diagnostic test results will improve Outcome: Progressing Goal: Respiratory complications will improve Outcome: Progressing Goal: Cardiovascular complication will be avoided Outcome: Progressing   Problem: Activity: Goal: Risk for activity intolerance will decrease Outcome: Progressing   Problem: Nutrition: Goal: Adequate nutrition will be maintained Outcome: Progressing   Problem: Coping: Goal: Level of anxiety will decrease Outcome: Progressing   Problem: Elimination: Goal: Will not experience complications related to bowel motility Outcome: Progressing Goal: Will not experience complications related to urinary retention Outcome: Progressing   Problem: Pain Managment: Goal: General experience of comfort will improve Outcome: Progressing   Problem: Safety: Goal: Ability to remain free from injury will improve Outcome: Progressing   Problem: Skin Integrity: Goal: Risk for impaired skin integrity will decrease Outcome: Progressing   Problem: Education: Goal: Understanding of cardiac disease, CV risk reduction, and recovery process will improve Outcome: Progressing Goal: Understanding of medication regimen will improve Outcome: Progressing Goal: Individualized Educational Video(s) Outcome: Progressing   Problem: Activity: Goal: Ability to tolerate increased activity will improve Outcome: Progressing   Problem: Cardiac: Goal: Ability to achieve and maintain adequate cardiopulmonary perfusion will improve Outcome: Progressing Goal: Vascular access site(s) Level 0-1 will be maintained Outcome: Progressing   Problem: Health  Behavior/Discharge Planning: Goal: Ability to safely manage health-related needs after discharge will improve Outcome: Progressing

## 2020-08-02 NOTE — Progress Notes (Signed)
[facility] for heparin Indication: chest pain/ACS  Allergies  Allergen Reactions  . Lisinopril Swelling and Other (See Comments)    Angioedema   . Losartan Potassium Swelling and Other (See Comments)    Angioedema   . Crestor [Rosuvastatin] Swelling and Other (See Comments)    Angioedema   . Tape Other (See Comments)    Irritates the skin    Patient Measurements:  Height: 5\' 8"  (172.7 cm) Weight: 76.1 kg (167 lb 12.3 oz) IBW/kg (Calculated) : 68.4 Heparin Dosing Weight: TBW  Vital Signs: Temp: 98 F (36.7 C) (04/21 0756) Temp Source: Oral (04/21 0756) BP: 132/78 (04/21 0832) Pulse Rate: 72 (04/21 0756)  Labs: Recent Labs    07/31/20 1540 07/31/20 1825 07/31/20 1838 07/31/20 2227 08/01/20 0320 08/02/20 0638  HGB 13.8  --   --   --  11.1* 10.0*  HCT 42.3  --   --   --  33.5* 30.2*  PLT 421*  --   --   --  390 418*  APTT  --   --  28  --   --   --   LABPROT  --   --  14.6  --   --   --   INR  --   --  1.1  --   --   --   HEPARINUNFRC  --   --   --  0.27* <0.10* <0.10*  CREATININE 1.30*  --   --   --   --  1.29*  TROPONINIHS 315* 316*  --  256* 175*  --     Estimated Creatinine Clearance: 39 mL/min (A) (by C-G formula based on SCr of 1.29 mg/dL (H)).  Assessment: Patient is an 87 y.o M presented to the ED on 4/19 with c/o left shoulder pain and s/p fall. Head CT showed "small right forehead scalp hematoma." Troponin was found to be elevated. On aspirin PTA but no anticoagulation.   He is now s/p cath and plans for PCI today. Pharmacy dosing heparin.   Goal of Therapy:  Heparin level 0.3-0.7 units/ml Monitor platelets by anticoagulation protocol: Yes   Plan:  -Increase heparin to 1400 units/hr -Will follow plans post cath  Hildred Laser, PharmD Clinical Pharmacist **Pharmacist phone directory can now be found on [email or web address] ([email or web address]).  Listed under [place].

## 2020-08-03 ENCOUNTER — Encounter (HOSPITAL_COMMUNITY): Payer: Self-pay | Admitting: Cardiovascular Disease

## 2020-08-03 LAB — CBC
HCT: 33.3 % — ABNORMAL LOW (ref 39.0–52.0)
Hemoglobin: 10.9 g/dL — ABNORMAL LOW (ref 13.0–17.0)
MCH: 30.5 pg (ref 26.0–34.0)
MCHC: 32.7 g/dL (ref 30.0–36.0)
MCV: 93.3 fL (ref 80.0–100.0)
Platelets: 501 10*3/uL — ABNORMAL HIGH (ref 150–400)
RBC: 3.57 MIL/uL — ABNORMAL LOW (ref 4.22–5.81)
RDW: 13.6 % (ref 11.5–15.5)
WBC: 11.4 10*3/uL — ABNORMAL HIGH (ref 4.0–10.5)
nRBC: 0 % (ref 0.0–0.2)

## 2020-08-03 LAB — BASIC METABOLIC PANEL
Anion gap: 12 (ref 5–15)
BUN: 11 mg/dL (ref 8–23)
CO2: 17 mmol/L — ABNORMAL LOW (ref 22–32)
Calcium: 8.4 mg/dL — ABNORMAL LOW (ref 8.9–10.3)
Chloride: 107 mmol/L (ref 98–111)
Creatinine, Ser: 1.18 mg/dL (ref 0.61–1.24)
GFR, Estimated: 60 mL/min — ABNORMAL LOW (ref >60)
Glucose, Bld: 84 mg/dL (ref 70–99)
Potassium: 4.4 mmol/L (ref 3.5–5.1)
Sodium: 136 mmol/L (ref 135–145)

## 2020-08-03 LAB — GLUCOSE, CAPILLARY
Glucose-Capillary: 79 mg/dL (ref 70–99)
Glucose-Capillary: 86 mg/dL (ref 70–99)

## 2020-08-03 MED ORDER — CLOPIDOGREL BISULFATE 75 MG PO TABS: 75.0000 mg | ORAL_TABLET | Freq: Every day | ORAL | 0 refills | Status: DC

## 2020-08-03 MED ORDER — ASPIRIN EC 81 MG PO TBEC: 81.0000 mg | DELAYED_RELEASE_TABLET | Freq: Every day | ORAL | 0 refills | Status: DC

## 2020-08-03 MED ORDER — ATORVASTATIN CALCIUM 80 MG PO TABS: 80.0000 mg | ORAL_TABLET | Freq: Every day | ORAL | 0 refills | Status: DC

## 2020-08-03 NOTE — Discharge Summary (Addendum)
Discharge Summary  Kevin Schwartz TDV:[id number] DOB: 1930/05/12  PCP: [name], MD  Admit date: 07/31/2020 Discharge date: 08/07/2020  Time spent: 35 minutes.  Recommendations for Outpatient Follow-up:  1. Follow-up with your cardiologist in 1 to 2 weeks. 2. Follow-up with your PCP 3. Take your medications as prescribed  Discharge Diagnoses:  Active Hospital Problems   Diagnosis Date Noted  . Elevated troponin   . NSTEMI (non-ST elevated myocardial infarction) ([place]) 07/31/2020    Resolved Hospital Problems  No resolved problems to display.    Discharge Condition: Stable  Diet recommendation: Resume previous diet  Vitals:   08/03/20 0504 08/03/20 0918  BP: (!) 147/66 (!) 152/70  Pulse: 84 82  Resp: 16 18  Temp: 98.5 F (36.9 C) 97.9 F (36.6 C)  SpO2: 97% 96%    History of present illness:  87yo w/ ahistory ofCAD s/p PCI, DM2, RAS, CKD stage II,HTN, HLD, ACD who presented to the ED 4/19 with left shoulder pain. He also reported an episode the morning of his presentation in which he suddenly felt weak and dizzy/lightheaded and fell to the ground on his right side. There was no loss of consciousness or head trauma. In the ED an EKG was without obvious ischemic change but his initial troponin was elevated at 300. Cardiology was consulted and the patient was admitted to the acute unit. Transferred to [facility] on 08/01/20 for planned Left heart cath.  CORONARY BALLOON ANGIOPLASTY  08/02/20  CORONARY STENT INTERVENTION    Conclusion    Mid LAD-1 lesion is 40% stenosed.  1st Sept lesion is 80% stenosed.  Mid Cx to Dist Cx lesion is 20% stenosed.  LPAV lesion is 40% stenosed.  Lat 2nd Mrg lesion is 90% stenosed.  2nd Mrg-2 lesion is 80% stenosed.  2nd Mrg-1 lesion is 40% stenosed.  Prox RCA lesion is 85% stenosed.  Mid RCA-1 lesion is 50% stenosed.  Mid RCA-2 lesion is 85% stenosed.  Dist RCA lesion is 90%  stenosed.  Post intervention, there is a 45% residual stenosis.  Post intervention, there is a 0% residual stenosis.  Mid LAD-2 lesion is 80% stenosed.  A stent was successfully placed.  Difficult but successful two-vessel coronary intervention with Cutting Balloon angioplasty of the 80% in-stent restenosis of the circumflex marginal vessel treated with a 2.5 x 10 mm Wolverine cutting balloon and dilatation with a 3.0 x 15 mm noncompliant balloon with the 80% in-stent stenosis being fibrotic and reduced to approximately 45%.   The 80% mid LAD stenosis underwent successful stenting with a 5 x 15 mm Resolute Onyx stent postdilated to 2.6 mm with the stenosis being reduced to 0%.  RECOMMENDATION: DAPT for minimum of 12 months. Medical therapy for concomitant CAD and in particular the diffusely diseased RCA. The patient will be hydrated post procedure. Continue aggressive lipid-lowering therapy with target LDL in the 50s or below. Optimal blood pressure management.    08/03/20:  Seen in his bedside.  He is eager to go home.  Cleared by cardiology to be discharged.  Hospital Course:  Active Problems:   NSTEMI (non-ST elevated myocardial infarction) (HCC)   Elevated troponin  NSTEMI related to PCI/stent thrombosis Presented with troponin greater than 300 2D echo done on 08/01/2020 showed LVEF 45 to 50% with global hypokinesis involving the left ventricle. He was transferred from [place] to [facility] on 08/01/2020 for heart cath.  Coronary stent intervention on 08/02/2020 at [facility]:  Difficult but  successful two-vessel coronary intervention with Cutting Balloon angioplasty of the 80% in-stent restenosis of the circumflex marginal vessel treated with a 2.5 x 10 mm Wolverine cutting balloon and dilatation with a 3.0 x 15 mm noncompliant balloon with the 80% in-stent stenosis being fibrotic and reduced to approximately 45%.   CAD s/p PCI with coronary stent intervention  on 08/02/2020. On aspirin and high-dose statin, Lipitor 80 mg daily.  Acute blood loss anemia Presented with hemoglobin of 13, downtrended to 10.0 with no overt bleeding.  CKD 3A Baseline creatinine appears to be 1.2 with GFR 54 Avoid dehydration Avoid nephrotoxic agents   Chronic anxiety/depression Continue home Celexa  Essential hypertension Continue home regimen.  Gout Continue home allopurinol   Code Status: Full code.   Consultants:  Cardiology.  Procedures:  Heart cath.  Antimicrobials:  None.  Discharge Exam: BP (!) 152/70 (BP Location: Left Arm)   Pulse 82   Temp 97.9 F (36.6 C) (Oral)   Resp 18   Ht 5\' 8"  (1.727 m)   Wt 76.1 kg   SpO2 96%   BMI 25.51 kg/m  . General: 87 y.o. year-old male well developed well nourished in no acute distress.  Alert and oriented x3. . Cardiovascular: Regular rate and rhythm with no rubs or gallops.  No thyromegaly or JVD noted.   [name] Respiratory: Clear to auscultation with no wheezes or rales. Good inspiratory effort. . Abdomen: Soft nontender nondistended with normal bowel sounds x4 quadrants. . Musculoskeletal: No lower extremity edema. 2/4 pulses in all 4 extremities. . Skin: No ulcerative lesions noted or rashes, . Psychiatry: Mood is appropriate for condition and setting  Discharge Instructions You were cared for by a hospitalist during your hospital stay. If you have any questions about your discharge medications or the care you received while you were in the hospital after you are discharged, you can call the unit and asked to speak with the hospitalist on call if the hospitalist that took care of you is not available. Once you are discharged, your primary care physician will handle any further medical issues. Please note that NO REFILLS for any discharge medications will be authorized once you are discharged, as it is imperative that you return to your primary care physician (or establish a relationship  with a primary care physician if you do not have one) for your aftercare needs so that they can reassess your need for medications and monitor your lab values.  Discharge Instructions    Amb Referral to Cardiac Rehabilitation   Complete by: As directed    Diagnosis:  Coronary Stents NSTEMI     After initial evaluation and assessments completed: Virtual Based Care may be provided alone or in conjunction with Phase 2 Cardiac Rehab based on patient barriers.: Yes     Allergies as of 08/03/2020      Reactions   Lisinopril Swelling, Other (See Comments)   Angioedema   Losartan Potassium Swelling, Other (See Comments)   Angioedema   Crestor [rosuvastatin] Swelling, Other (See Comments)   Angioedema   Tape Other (See Comments)   Irritates the skin      Medication List    STOP taking these medications   metoprolol tartrate 50 MG tablet Commonly known as: LOPRESSOR     TAKE these medications   Accu-Chek Aviva Plus test strip Generic drug: glucose blood Use to check blood sugar daily. Dx:E11.8   Accu-Chek Softclix Lancets lancets Use to test blood sugar daily. Dx:E11.8   allopurinol 100  MG tablet Commonly known as: ZYLOPRIM Take 1 tablet (100 mg total) by mouth daily.   amLODipine 5 MG tablet Commonly known as: NORVASC Take 1 tablet (5 mg total) by mouth daily.   aspirin EC 81 MG tablet Take 1 tablet (81 mg total) by mouth daily.   atorvastatin 80 MG tablet Commonly known as: LIPITOR Take 1 tablet (80 mg total) by mouth daily.   B-D SINGLE USE SWABS REGULAR Pads Use with testing of blood sugar. Dx:E11.8   citalopram 20 MG tablet Commonly known as: CELEXA Take 1 tablet (20 mg total) by mouth daily.   clopidogrel 75 MG tablet Commonly known as: PLAVIX Take 1 tablet (75 mg total) by mouth daily with breakfast.   diclofenac Sodium 1 % Gel Commonly known as: Voltaren Apply 4 g topically 4 (four) times daily.   lidocaine 5 % Commonly known as: Lidoderm Place 1  patch onto the skin daily. Remove & Discard patch within 12 hours or as directed by MD   nitroGLYCERIN 0.4 MG SL tablet Commonly known as: NITROSTAT DISSOLVE 1 TABLET UNDER THE TONGUE EVERY 5 MINUTES AS  NEEDED FOR CHEST PAIN. MAX  OF 3 TABLETS IN 15 MINUTES. CALL 911 IF PAIN PERSISTS.   Ultra-Thin II Mini Pen Needle 31G X 5 MM Misc Generic drug: Insulin Pen Needle      Allergies  Allergen Reactions  . Lisinopril Swelling and Other (See Comments)    Angioedema   . Losartan Potassium Swelling and Other (See Comments)    Angioedema   . Crestor [Rosuvastatin] Swelling and Other (See Comments)    Angioedema   . Tape Other (See Comments)    Irritates the skin    Follow-up Information    [name], MD. Call in 1 day(s).   Specialties: Family Medicine, Emergency Medicine Why: Please call for a posthospital follow-up appointment. Contact information: [place] [address] [phone]        Bensimhon, Shaune Pascal, MD .   Specialty: Cardiology Contact information: [address] [place] [address] [place], [place] Follow up.   Why: HHPT,HHOT Contact information: [place] [address] [place], [facility] Follow up.   Why: rollator, 3 n 1 Contact information: [address] [place] [address] [phone]        Almyra Deforest, [place]. Go on 08/24/2020.   Specialties: Cardiology, Radiology Why: @10 :15am for hospital follow up with Dr. Delphina Cahill PA. Please arrive 15 minutes early  Contact information: [address] [place] [address] [phone]                The results of significant diagnostics from this hospitalization (including imaging, microbiology, ancillary and laboratory) are listed below for reference.    Significant Diagnostic Studies: DG Chest 1 View  Result Date: 07/31/2020 CLINICAL DATA:  Recent fall EXAM:  CHEST  1 VIEW COMPARISON:  07/30/2015 FINDINGS: Cardiac shadow is enlarged. Aortic calcifications are noted. The lungs are well aerated bilaterally with mild central vascular congestion. No focal infiltrate or effusion is seen. No bony abnormality is noted. IMPRESSION: Changes of mild vascular congestion. No other focal abnormality is seen. Electronically Signed   By: Inez Catalina M.D.   On: 07/31/2020 15:36   CT Head Wo Contrast  Result Date: 07/31/2020 CLINICAL DATA:  Dizziness EXAM: CT HEAD WITHOUT CONTRAST TECHNIQUE:  Contiguous axial images were obtained from the base of the skull through the vertex without intravenous contrast. COMPARISON:  CT brain 09/30/2010 FINDINGS: Brain: No acute territorial infarction, hemorrhage, or intracranial mass. Moderate atrophy. Extensive white matter hypodensity most likely chronic small vessel ischemic change. Slightly prominent ventricles felt secondary to atrophy. Vascular: No hyperdense vessels.  Carotid vascular calcification. Skull: No fracture Sinuses/Orbits: Left greater than right mastoid effusions. Other: Small right forehead scalp hematoma IMPRESSION: 1. No CT evidence for acute intracranial abnormality. Small right forehead scalp hematoma. 2. Atrophy and chronic small vessel ischemic changes of the white matter. 3. Left greater than right mastoid effusions. Electronically Signed   By: Donavan Foil M.D.   On: 07/31/2020 15:13   CARDIAC CATHETERIZATION  Result Date: 08/02/2020  Mid LAD-1 lesion is 40% stenosed.  1st Sept lesion is 80% stenosed.  Mid Cx to Dist Cx lesion is 20% stenosed.  LPAV lesion is 40% stenosed.  Lat 2nd Mrg lesion is 90% stenosed.  2nd Mrg-2 lesion is 80% stenosed.  2nd Mrg-1 lesion is 40% stenosed.  Prox RCA lesion is 85% stenosed.  Mid RCA-1 lesion is 50% stenosed.  Mid RCA-2 lesion is 85% stenosed.  Dist RCA lesion is 90% stenosed.  Post intervention, there is a 45% residual stenosis.  Post intervention, there is a 0%  residual stenosis.  Mid LAD-2 lesion is 80% stenosed.  A stent was successfully placed.  Difficult but successful two-vessel coronary intervention with Cutting Balloon angioplasty of the 80% in-stent restenosis of the circumflex marginal vessel treated with a 2.5 x 10 mm Wolverine cutting balloon and dilatation with a 3.0 x 15 mm noncompliant balloon with the 80% in-stent stenosis being fibrotic and reduced to approximately 45%.   The 80% mid LAD stenosis underwent successful stenting with a 5 x 15 mm Resolute Onyx stent postdilated to 2.6 mm with the stenosis being reduced to 0%. RECOMMENDATION: DAPT for minimum of 12 months.  Medical therapy for concomitant CAD and in particular the diffusely diseased RCA.  The patient will be hydrated post procedure.  Continue aggressive lipid-lowering therapy with target LDL in the 50s or below.  Optimal blood pressure management.   CARDIAC CATHETERIZATION  Result Date: 08/01/2020  Prox RCA lesion is 85% stenosed.  Mid RCA-1 lesion is 50% stenosed.  Mid RCA-2 lesion is 85% stenosed.  Dist RCA lesion is 90% stenosed.  Mid LAD-1 lesion is 40% stenosed.  Mid LAD-2 lesion is 80% stenosed.  Mid Cx to Dist Cx lesion is 20% stenosed.  LPAV lesion is 40% stenosed.  1st Sept lesion is 80% stenosed.  Lat 2nd Mrg lesion is 90% stenosed.  2nd Mrg-1 lesion is 40% stenosed.  2nd Mrg-2 lesion is 80% stenosed.  Multivessel CAD with coronary calcification.  The LAD had 3% stenosis in the proximal to mid segment and a focal 80% mid stenosis; the large circumflex marginal vessel has smooth 40% proximal stenosis and there is focal 80% in-stent restenosis in the circumflex stent.  There is distal branch stenosis of 90% and a very small caliber branch of this marginal vessel.  The AV groove circumflex has mild 20 and 40% stenoses; the right coronary artery is a small caliber nondominant tortuous vessel that has focal segmental stenoses of 85 to 90% in the midsegment, 80 and 90%  distally with small caliber distal vessel. LVEDP 12 mmHg. RECOMMENDATION: With the patient's age of 87 years, and stage IIIb CKD with creatinine clearance at 39, recommend hydration overnight.  We will  resume heparin therapy 8 hours post sheath pull.  Angiograms were reviewed with Dr. Angelena Form.  Will initiate oral Plavix tomorrow morning with plans for two-vessel coronary intervention tomorrow following hydration involving the in-stent restenosis of the circumflex marginal vessel and mid LAD.  We will initially plan for medical therapy to the RCA which is diffusely diseased.   DG Shoulder Left  Result Date: 07/31/2020 CLINICAL DATA:  Left shoulder pain following recent fall, initial encounter EXAM: LEFT SHOULDER - 2+ VIEW COMPARISON:  None. FINDINGS: Degenerative changes of the acromioclavicular joint and glenohumeral joint are seen. No acute fracture or dislocation is noted. No soft tissue abnormality is seen. IMPRESSION: Degenerative change without acute abnormality. Electronically Signed   By: Inez Catalina M.D.   On: 07/31/2020 15:30   ECHOCARDIOGRAM COMPLETE  Result Date: 08/02/2020    ECHOCARDIOGRAM REPORT   Patient Name:   ZARIN KNUPP [facility] Date of Exam: 08/01/2020 Medical Rec #:  [id number]               Height:       68.0 in Accession #:    [id number]              Weight:       156.3 lb Date of Birth:  1930/05/27                BSA:          1.840 m Patient Age:    86 years                BP:           142/85 mmHg Patient Gender: M                       HR:           78 bpm. Exam Location:  Inpatient Procedure: 2D Echo, Cardiac Doppler, Color Doppler and Intracardiac            Opacification Agent Indications:    Acute Myocardial Infarction I21.9  History:        Patient has prior history of Echocardiogram examinations, most                 recent 04/18/2016. CAD, PAD; Risk Factors:Hypertension, Diabetes,                 Dyslipidemia and Sleep Apnea. CKD.  Sonographer:    Tiffany Dance Referring  Phys: [id number] [place]  1. Left ventricular ejection fraction, by estimation, is 45 to 50%. The left ventricle has mildly decreased function. The left ventricle demonstrates global hypokinesis. There is moderate concentric left ventricular hypertrophy. Left ventricular diastolic function could not be evaluated. There is hypokinesis of the left ventricular, basal-mid anteroseptal wall.  2. Right ventricular systolic function is normal. The right ventricular size is normal. There is normal pulmonary artery systolic pressure. The estimated right ventricular systolic pressure is 17.6 mmHg.  3. The mitral valve is degenerative. Mild mitral valve regurgitation. No evidence of mitral stenosis.  4. The aortic valve is tricuspid. There is moderate calcification of the aortic valve. There is moderate thickening of the aortic valve. Aortic valve regurgitation is mild. Mild to moderate aortic valve sclerosis/calcification is present, without any evidence of aortic stenosis. Aortic regurgitation PHT measures 573 msec.  5. The inferior vena cava is dilated in size with >50% respiratory variability, suggesting right atrial pressure of 8 mmHg.  6. A small pericardial effusion is  present. The pericardial effusion is posterior to the left ventricle. There is no evidence of cardiac tamponade. FINDINGS  Left Ventricle: Left ventricular ejection fraction, by estimation, is 45 to 50%. The left ventricle has mildly decreased function. The left ventricle demonstrates global hypokinesis. The left ventricular internal cavity size was normal in size. There is  moderate concentric left ventricular hypertrophy. Left ventricular diastolic function could not be evaluated. Right Ventricle: The right ventricular size is normal. No increase in right ventricular wall thickness. Right ventricular systolic function is normal. There is normal pulmonary artery systolic pressure. The tricuspid regurgitant velocity is 2.25 m/s, and   with an assumed right atrial pressure of 8 mmHg, the estimated right ventricular systolic pressure is 22.2 mmHg. Left Atrium: Left atrial size was normal in size. Right Atrium: Right atrial size was normal in size. Pericardium: A small pericardial effusion is present. The pericardial effusion is posterior to the left ventricle. There is no evidence of cardiac tamponade. Mitral Valve: The mitral valve is degenerative in appearance. There is mild thickening of the mitral valve leaflet(s). There is mild calcification of the mitral valve leaflet(s). Mild to moderate mitral annular calcification. Mild mitral valve regurgitation. No evidence of mitral valve stenosis. Tricuspid Valve: The tricuspid valve is normal in structure. Tricuspid valve regurgitation is mild . No evidence of tricuspid stenosis. Aortic Valve: The aortic valve is tricuspid. There is moderate calcification of the aortic valve. There is moderate thickening of the aortic valve. Aortic valve regurgitation is mild. Aortic regurgitation PHT measures 573 msec. Mild to moderate aortic valve sclerosis/calcification is present, without any evidence of aortic stenosis. Pulmonic Valve: The pulmonic valve was normal in structure. Pulmonic valve regurgitation is not visualized. No evidence of pulmonic stenosis. Aorta: The aortic root is normal in size and structure. Venous: The inferior vena cava is dilated in size with greater than 50% respiratory variability, suggesting right atrial pressure of 8 mmHg. IAS/Shunts: No atrial level shunt detected by color flow Doppler.  LEFT VENTRICLE PLAX 2D LVIDd:         4.40 cm LVIDs:         3.40 cm LV PW:         1.50 cm LV IVS:        1.30 cm LVOT diam:     1.80 cm LV SV:         47 LV SV Index:   26 LVOT Area:     2.54 cm  RIGHT VENTRICLE          IVC RV Basal diam:  2.80 cm  IVC diam: 2.10 cm TAPSE (M-mode): 1.3 cm LEFT ATRIUM             Index       RIGHT ATRIUM           Index LA diam:        4.40 cm 2.39 cm/m  RA  Area:     15.20 cm LA Vol (A2C):   43.7 ml 23.74 ml/m RA Volume:   35.60 ml  19.34 ml/m LA Vol (A4C):   56.3 ml 30.59 ml/m LA Biplane Vol: 50.2 ml 27.28 ml/m  AORTIC VALVE LVOT Vmax:   98.15 cm/s LVOT Vmean:  71.100 cm/s LVOT VTI:    0.186 m AI PHT:      573 msec  AORTA Ao Root diam: 3.30 cm Ao Asc diam:  3.30 cm MITRAL VALVE  TRICUSPID VALVE MV Area (PHT): 3.65 cm     TR Peak grad:   20.2 mmHg MV Decel Time: 208 msec     TR Vmax:        225.00 cm/s MV E velocity: 120.00 cm/s MV A velocity: 103.00 cm/s  SHUNTS MV E/A ratio:  1.17         Systemic VTI:  0.19 m                             Systemic Diam: 1.80 cm Fransico Him MD Electronically signed by Fransico Him MD Signature Date/Time: 08/02/2020/11:36:09 AM    Final    CT Temporal Bones W Contrast  Result Date: 07/27/2020 CLINICAL DATA:  Mastoiditis; right ear pain with leukocytosis. Question mastoiditis. EXAM: CT TEMPORAL BONES WITH CONTRAST TECHNIQUE: Axial and coronal plane CT imaging of the petrous temporal bones was performed with thin-collimation image reconstruction after intravenous contrast administration. Multiplanar CT image reconstructions were also generated. CONTRAST:  73mL OMNIPAQUE IOHEXOL 300 MG/ML  SOLN COMPARISON:  Head CT 09/30/2010 FINDINGS: RIGHT: Small amount of debris within the external auditory canal. The middle ear cavity is well-aerated. The ossicles are unremarkable. The inner ear structures, internal auditory and facial nerve canals are normal. Moderate right mastoid effusion without coalescent osseous erosion. LEFT: Small amount of debris within the external auditory canal. The middle ear cavity is well-aerated. The ossicles are unremarkable. The inner ear structures, internal auditory and facial nerve canals are normal. Large left mastoid effusion without appreciable coalescent osseous erosion. OTHER: Prominent right TMJ osteoarthrosis. Generalized atrophy of the visualized brain with cerebral white matter  chronic small vessel ischemic disease. No acute orbital finding. Trace scattered paranasal sinus mucosal thickening at the imaged levels. IMPRESSION: Right temporal bone: 1. Moderate right mastoid effusion without coalescent osseous erosion. Correlate clinically for signs and symptoms of mastoiditis. 2. Small amount of debris within the right external auditory canal. 3. Otherwise unremarkable CT appearance of the right temporal bone. Left temporal bone: 1. Large left mastoid effusion without appreciable coalescent osseous erosion. Correlate clinically for signs and symptoms of mastoiditis. 2. Small amount of debris within the left external auditory canal. 3. Otherwise unremarkable CT appearance of the left temporal bone. Also of note, there is prominent right TMJ osteoarthrosis. Electronically Signed   By: Kellie Simmering DO   On: 07/27/2020 17:21    Microbiology: Recent Results (from the past 240 hour(s))  Resp Panel by RT-PCR (Flu A&B, Covid) Nasopharyngeal Swab     Status: None   Collection Time: 07/31/20  6:38 PM   Specimen: Nasopharyngeal Swab; Nasopharyngeal(NP) swabs in vial transport medium  Result Value Ref Range Status   SARS Coronavirus 2 by RT PCR NEGATIVE NEGATIVE Final    Comment: (NOTE) SARS-CoV-2 target nucleic acids are NOT DETECTED.  The SARS-CoV-2 RNA is generally detectable in upper respiratory specimens during the acute phase of infection. The lowest concentration of SARS-CoV-2 viral copies this assay can detect is 138 copies/mL. A negative result does not preclude SARS-Cov-2 infection and should not be used as the sole basis for treatment or other patient management decisions. A negative result may occur with  improper specimen collection/handling, submission of specimen other than nasopharyngeal swab, presence of viral mutation(s) within the areas targeted by this assay, and inadequate number of viral copies(<138 copies/mL). A negative result must be combined with clinical  observations, patient history, and epidemiological information. The expected result is Negative.  Fact  Sheet for Patients:  EntrepreneurPulse.com.au  Fact Sheet for Healthcare Providers:  IncredibleEmployment.be  This test is no t yet approved or cleared by the [place] FDA and  has been authorized for detection and/or diagnosis of SARS-CoV-2 by FDA under an Emergency Use Authorization (EUA). This EUA will remain  in effect (meaning this test can be used) for the duration of the COVID-19 declaration under Section 564(b)(1) of the Act, 21 U.S.C.section 360bbb-3(b)(1), unless the authorization is terminated  or revoked sooner.       Influenza A by PCR NEGATIVE NEGATIVE Final   Influenza B by PCR NEGATIVE NEGATIVE Final    Comment: (NOTE) The Xpert Xpress SARS-CoV-2/FLU/RSV plus assay is intended as an aid in the diagnosis of influenza from Nasopharyngeal swab specimens and should not be used as a sole basis for treatment. Nasal washings and aspirates are unacceptable for Xpert Xpress SARS-CoV-2/FLU/RSV testing.  Fact Sheet for Patients: EntrepreneurPulse.com.au  Fact Sheet for Healthcare Providers: IncredibleEmployment.be  This test is not yet approved or cleared by the [place] FDA and has been authorized for detection and/or diagnosis of SARS-CoV-2 by FDA under an Emergency Use Authorization (EUA). This EUA will remain in effect (meaning this test can be used) for the duration of the COVID-19 declaration under Section 564(b)(1) of the Act, 21 U.S.C. section 360bbb-3(b)(1), unless the authorization is terminated or revoked.  Performed at [facility], [place] [address] [place], [place] [address]      Labs: Basic Metabolic Panel: Recent Labs  Lab 08/02/20 0638 08/03/20 0148  NA 138 136  K 3.7 4.4  CL 110 107  CO2 21* 17*  GLUCOSE 87 84  BUN 14 11  CREATININE 1.29*  1.18  CALCIUM 8.2* 8.4*   Liver Function Tests: No results for input(s): AST, ALT, ALKPHOS, BILITOT, PROT, ALBUMIN in the last 168 hours. No results for input(s): LIPASE, AMYLASE in the last 168 hours. No results for input(s): AMMONIA in the last 168 hours. CBC: Recent Labs  Lab 08/01/20 0320 08/02/20 0638 08/03/20 0148  WBC 9.2 7.7 11.4*  HGB 11.1* 10.0* 10.9*  HCT 33.5* 30.2* 33.3*  MCV 93.6 93.2 93.3  PLT 390 418* 501*   Cardiac Enzymes: No results for input(s): CKTOTAL, CKMB, CKMBINDEX, TROPONINI in the last 168 hours. BNP: BNP (last 3 results) No results for input(s): BNP in the last 8760 hours.  ProBNP (last 3 results) No results for input(s): PROBNP in the last 8760 hours.  CBG: Recent Labs  Lab 08/02/20 1150 08/02/20 1856 08/02/20 2034 08/03/20 0821 08/03/20 1237  GLUCAP 73 54* 118* 79 86       Signed:  Kayleen Memos, MD [facility] 08/07/2020, 5:16 PM

## 2020-08-03 NOTE — Discharge Instructions (Signed)
Acute Coronary Syndrome Acute coronary syndrome (ACS) is a serious problem in which there is suddenly not enough blood and oxygen reaching the heart. ACS can result in chest pain or a heart attack. This condition is a medical emergency. If you have any symptoms of this condition, get help right away. What are the causes? This condition may be caused by:  A buildup of fat and cholesterol inside the arteries (atherosclerosis). This is the most common cause. The buildup (plaque) can cause blood vessels in the heart (coronary arteries) to become narrow or blocked, which reduces blood flow to the heart. Plaque can also break off and lead to a clot, which can block an artery and cause a heart attack or stroke.  Sudden tightening of the muscles around the coronary arteries (coronary spasm).  Tearing of a coronary artery (spontaneous coronary artery dissection).  Very low blood pressure (hypotension).  An abnormal heartbeat (arrhythmia).  Other medical conditions that cause a decrease of oxygen to the heart, such as anemiaorrespiratory failure.  Using cocaine or methamphetamine.   What increases the risk? The following factors may make you more likely to develop this condition:  Age. The risk for ACS increases as you get older.  History of chest pain, heart attack, peripheral artery disease, or stroke.  Having taken chemotherapy or immune-suppressing medicines.  Being male.  Family history of chest pain, heart disease, or stroke.  Smoking.  Not exercising enough.  Being overweight.  High cholesterol.  High blood pressure (hypertension).  Diabetes.  Excessive alcohol use. What are the signs or symptoms? Common symptoms of this condition include:  Chest pain. The pain may last a long time, or it may stop and come back (recur). It may feel like: ? Crushing or squeezing. ? Tightness, pressure, fullness, or heaviness.  Arm, neck, jaw, or back pain.  Heartburn or  indigestion.  Shortness of breath.  Nausea.  Sudden cold sweats.  Light-headedness.  Dizziness or passing out.  Tiredness (fatigue). Sometimes there are no symptoms. How is this diagnosed? This condition may be diagnosed based on:  Your medical history and symptoms.  Imaging tests, such as: ? An electrocardiogram (ECG). This measures the heart's electrical activity. ? X-rays. ? CT scan. ? A coronary angiogram. For this test, dye is injected into the heart arteries and then X-rays are taken. ? Myocardial perfusion imaging. This test shows how well blood flows through your heart muscle.  Blood tests. These may be repeated at certain time intervals.  Exercise stress testing.  Echocardiogram. This is a test that uses sound waves to produce detailed images of the heart. How is this treated? Treatment for this condition may include:  Oxygen therapy.  Medicines, such as: ? Antiplatelet medicines and blood-thinning medicines, such as aspirin. These help prevent blood clots. ? Medicine that dissolves any blood clots (fibrinolytic therapy). ? Blood pressure medicines. ? Nitroglycerin. This helps widen blood vessels to improve blood flow. ? Pain medicine. ? Cholesterol-lowering medicine.  Surgery, such as: ? Coronary angioplasty with stent placement. This involves placing a small piece of metal that looks like mesh or a spring into a narrow coronary artery. This widens the artery and keeps it open. ? Coronary artery bypass surgery. This involves taking a section of a blood vessel from a different part of your body and placing it on the blocked coronary artery to allow blood to flow around the blockage.  Cardiac rehabilitation. This is a program that includes exercise training, education, and counseling to help   you recover. Follow these instructions at home: Eating and drinking  Eat a heart-healthy diet that includes whole grains, fruits and vegetables, lean proteins, and  low-fat or nonfat dairy products.  Limit how much salt (sodium) you eat as told by your health care provider. Follow instructions from your health care provider about any other eating or drinking restrictions, such as limiting foods that are high in fat and processed sugars.  Use healthy cooking methods such as roasting, grilling, broiling, baking, poaching, steaming, or stir-frying.  Work with a dietitian to follow a heart-healthy eating plan. Medicines  Take over-the-counter and prescription medicines only as told by your health care provider.  Do not take these medicines unless your health care provider approves: ? Vitamin supplements that contain vitamin A or vitamin E. ? NSAIDs, such as ibuprofen, naproxen, or celecoxib. ? Hormone replacement therapy that contains estrogen.  If you are taking blood thinners: ? Talk with your health care provider before you take any medicines that contain aspirin or NSAIDs. These medicines increase your risk for dangerous bleeding. ? Take your medicine exactly as told, at the same time every day. ? Avoid activities that could cause injury or bruising, and follow instructions about how to prevent falls. ? Wear a medical alert bracelet, and carry a card that lists what medicines you take. Activity  Follow your cardiac rehabilitation program. Do exercises as told by your physical therapist.  Ask your health care provider what activities and exercises are safe for you. Follow his or her instructions about lifting, driving, or climbing stairs. Lifestyle  Do not use any products that contain nicotine or tobacco, such as cigarettes, e-cigarettes, and chewing tobacco. If you need help quitting, ask your health care provider.  Do not drink alcohol if your health care provider tells you not to drink.  If you drink alcohol: ? Limit how much you have to 0-1 drink a day. ? Be aware of how much alcohol is in your drink. In the [place] one drink equals one 12 oz  bottle of beer (355 mL), one 5 oz glass of wine (148 mL), or one 1 oz glass of hard liquor (44 mL).  Maintain a healthy weight. If you need to lose weight, work with your health care provider to do so safely. General instructions  Tell all the health care providers who provide care for you about your heart condition, including your dentist. This may affect the medicines or treatment you receive.  Manage any other health conditions you have, such as hypertension or diabetes. These conditions affect your heart.  Pay attention to your mental health. You may be at higher risk for depression. ? Find ways to manage stress. ? Talk to your health care provider about depression screening and treatment.  Keep your vaccinations up to date. ? Get the flu shot (influenza vaccine) every year. ? Get the pneumococcal vaccine if you are age 65 or older.  If directed, monitor your blood pressure at home.  Keep all follow-up visits as told by your health care provider. This is important. Contact a health care provider if you:  Feel overwhelmed or sad.  Have trouble doing your daily activities. Get help right away if you:  Have pain in your chest, neck, arm, jaw, stomach, or back that recurs, and: ? It lasts for more than a few minutes. ? It is not relieved by taking the medicineyour health care provider prescribed.  Have unexplained: ? Heavy sweating. ? Heartburn or indigestion. ? Nausea   or vomiting. ? Shortness of breath. ? Difficulty breathing. ? Fatigue. ? Nervousness or anxiety. ? Weakness. ? Diarrhea. ? Dark stools or blood in your stool.  Have sudden light-headedness or dizziness.  Have blood pressure that is higher than 180/120.  Faint.  Have thoughts about hurting yourself. These symptoms may represent a serious problem that is an emergency. Do not wait to see if the symptoms will go away. Get medical help right away. Call your local emergency services (911 in the [place] Do  not drive yourself to the hospital.  Summary  Acute coronary syndrome (ACS) is when there is not enough blood and oxygen being supplied to the heart. ACS can result in chest pain or a heart attack.  Acute coronary syndrome is a medical emergency. If you have any symptoms of this condition, get help right away.  Treatment includes medicines and procedures to open the blocked arteries and restore blood flow. This information is not intended to replace advice given to you by your health care provider. Make sure you discuss any questions you have with your health care provider. Document Revised: 09/01/2018 Document Reviewed: 04/12/2018 Elsevier Patient Education  2021 Elsevier Inc.  

## 2020-08-03 NOTE — TOC Transition Note (Addendum)
Transition of Care [facility]) - CM/SW Discharge Note   Patient Details  Name: Kevin Schwartz MRN: [id number] Date of Birth: 28-Jan-1931  Transition [facility] CM/SW Contact:  Zenon Mayo, RN Phone Number: 08/03/2020, 12:32 PM   Clinical Narrative:    Patient is for dc today, he stays at the [facility] on [address], he states he uses a cane.  He will need a rolling walker and a 3 n 1.  He is ok with [employer] supplying this for him.  NCM made referral to [facility] with [employer]. They will bring this up to room prior to dc.  NCM offered choice, he has no preference.  NCM made referral to [place] with amedysis for [facility] services she is able to take with a soc date on Wed.  Patient is ok with this soc date.  Patient states his son will be transporting him back to motel today.  Patient cell is [phone] .  Physical Therapist asked if patient can get a rollator instead of a walker.  NCM called [employer] to change.    Final next level of care: [place] Barriers to Discharge: No Barriers Identified   Patient Goals and CMS Choice Patient states their goals for this hospitalization and ongoing recovery are:: return to [facility] CMS Medicare.gov Compare Post Acute Care list provided to:: Patient Represenative (must comment) Choice offered to / list presented to : Patient  Discharge Placement                       Discharge Plan and Services                DME Arranged: 3-N-1,Walker rolling DME Agency: [employer] Date DME Agency Contacted: 08/03/20 Time DME Agency Contacted: 1232 Representative spoke with at DME Agency: [place]: PT,OT [place] Agency: [place] Date [place]: 08/03/20 Time [place]: 1232 Representative spoke with at [place]: [place] Determinants of Health ([place]) Interventions     Readmission Risk Interventions No flowsheet data found.

## 2020-08-03 NOTE — Progress Notes (Signed)
CARDIAC REHAB PHASE I   PRE:  Rate/Rhythm: 81 SR  BP:  Sitting: 146/64      SaO2: 100 RA  MODE:  Ambulation: 50 ft   POST:  Rate/Rhythm: 107 ST  BP:  Sitting: 152/70    SaO2: 94 RA   Pt ambulated 73ft in hallway assist of one with gait belt and front wheel walker. Pt with slow gait. Denies CP, SOB, or dizziness. Returned to bed. Pt given MI book along with heart healthy and diabetic diets. Educated on importance of ASA and Plavix. Reviewed site care and restrictions. Encouraged continued ambulation with emphasis on safety. Will refer to CRP II [address] Rufina Falco, RN BSN 08/03/2020 9:28 AM

## 2020-08-03 NOTE — Progress Notes (Signed)
Occupational Therapy Evaluation Pt lives independently in the [facility] on [address]. His son brings him food from [employer] 3x/wk and on other days he eats breakfast in the hotel lobby and fixes himself lunches/dinners in his microwave in his room. Son takes him grocery shopping 2x/month and he uses store scooters for mobility. Pt with increased fatigue and WOB during ADL tasks and mobility. Educated pt on energy conservation and compensatory strategies to reduce risk of falls with ADL tasks. Discussed recommendation for pt to initially DC home with one of his children while he recovered, however pt declined. Pt states his son "will help as needed". Strongly recommend follow up with [place]. Pt will benefit from use of rollator and 3in1. Pt verbalized understanding of suggestions.    08/03/20 1400  OT Visit Information  Last OT Received On 08/03/20  Assistance Needed +1  History of Present Illness Pt is an 87 y/o male admitted secondary to weakness, dizziness, and fall with L shoulder pain. Imaging negative for acute abnormality in L shoulder. Pt also found to have NSTEMI and is s/p heart cath on 4/20. PMH includes HTN, CAD s/p stent, and DM.  Precautions  Precautions Fall  Precaution Comments Pt reports no falls, but per chart has had a fall.  Restrictions  Weight Bearing Restrictions No  Home Living  Family/patient expects to be discharged to: Other (Comment) (motel)  Living Arrangements Alone  Available Help at Discharge Family;Available PRN/intermittently (son checks on him)  Type of Home Other(Comment) (motel)  Home Access Level entry  Home Layout One level  Bathroom Shower/Tub Tub/shower unit  Research officer, trade [employer] - single point  Prior Function  Level of Independence Independent with assistive device(s)  Comments Uses cane for ambulation. Reports he does not drive. Uses transportation or his son helps with taking him places.  Communication  Communication No  difficulties  Pain Assessment  Pain Assessment Faces  Faces Pain Scale 2  Pain Location L shoulder discomfort with L shoulder flexion above 90  Pain Descriptors / Indicators Discomfort;Grimacing  Pain Intervention(s) Limited activity within patient's tolerance  Cognition  Arousal/Alertness Awake/alert  Behavior During Therapy WFL for tasks assessed/performed  Overall Cognitive Status No family/caregiver present to determine baseline cognitive functioning  General Comments Likely close to baseline. Decreased safety awareness noted.  Upper Extremity Assessment  Upper Extremity Assessment LUE deficits/detail  LUE Deficits / Details moves into abduction during shoulder flexion.Appesr to have RTC weakness, but functional; L elbow extension limited to @ 30 degrees. Unsure if baseline. Pt states his shoulder movement is "different"  LUE Coordination decreased gross motor  Lower Extremity Assessment  Lower Extremity Assessment Defer to PT evaluation;Generalized weakness  Cervical / Trunk Assessment  Cervical / Trunk Assessment Kyphotic  ADL  Overall ADL's  Needs assistance/impaired  Functional mobility during ADLs Supervision/safety;Rolling walker  General ADL Comments Overall set up/S for ADL tasks. Easily fatigues with ADL with increased WOB. Educated pt on energy conservation strategies and need to use 3in1 as shower chair. Pt states he only takes a shower 1x/wk when his friend is present. Recommend use of reacher to retrieve items from floor. Pt walks to hotel lobby for breakfast. Recommend using rollator to take breaks as he appears to fatigue quickly.  Bed Mobility  Overal bed mobility Needs Assistance  Bed Mobility Supine to Sit;Sit to Supine  Supine to sit Supervision;HOB elevated  Sit to supine Supervision  General bed mobility comments Supervision for safety. Increased time required to perform  bed mobility tasks  Transfers  Overall transfer level Needs assistance  Equipment used  Rolling walker (2 wheeled)  Transfers Sit to/from Stand  Sit to Stand From elevated surface;Supervision  General transfer comment required use of momentum to stand from lower surface  Balance  Overall balance assessment Needs assistance  Sitting-balance support No upper extremity supported;Feet supported  Sitting balance-[name] Fair  Standing balance support Bilateral upper extremity supported;During functional activity  Standing balance-[name] Poor  Standing balance comment Reliant on BUE support  General Comments  General comments (skin integrity, edema, etc.) VSS  OT - End of Session  Equipment Utilized During Treatment Gait belt;Rolling walker  Activity Tolerance Patient tolerated treatment well  Patient left in bed;with call bell/phone within reach;with bed alarm set  Nurse Communication Mobility status;Other (comment) (DC needs)  OT Assessment  OT Recommendation/Assessment All further OT needs can be met in the next venue of care  OT Visit Diagnosis Muscle weakness (generalized) (M62.81);Pain;Unsteadiness on feet (R26.81);History of falling (Z91.81)  Pain - Right/Left Left  Pain - part of body Shoulder  OT Problem List Decreased strength;Decreased activity tolerance;Decreased range of motion;Impaired balance (sitting and/or standing);Decreased safety awareness;Decreased knowledge of use of DME or AE;Cardiopulmonary status limiting activity;Pain;Impaired UE functional use  AM-PAC OT "6 Clicks" Daily Activity Outcome Measure (Version 2)  Help from another person eating meals? 4  Help from another person taking care of personal grooming? 3  Help from another person toileting, which includes using toliet, bedpan, or urinal? 3  Help from another person bathing (including washing, rinsing, drying)? 3  Help from another person to put on and taking off regular upper body clothing? 3  Help from another person to put on and taking off regular lower body clothing? 3  6 Click  Score 19  OT Recommendation  Follow Up Recommendations Home health OT  OT Equipment 3 in 1 bedside commode;Other (comment) (rollator)  Individuals Consulted  Consulted and Agree with Results and Recommendations Patient  Acute Rehab OT Goals  Patient Stated Goal to go home  OT Goal Formulation All assessment and education complete, DC therapy  OT Time Calculation  OT Start Time (ACUTE ONLY) 1240  OT Stop Time (ACUTE ONLY) 1308  OT Time Calculation (min) 28 min  OT Evaluation  $OT Eval Low Complexity 1 Low  OT Treatments  $Self Care/Home Management  8-22 mins  Written Expression  Dominant Hand Right  Maurie Boettcher, OT/L   Acute OT Clinical Specialist [place] Pager [phone] Office [phone]

## 2020-08-03 NOTE — Progress Notes (Addendum)
Progress Note  Patient Name: Kevin Schwartz Date of Encounter: 08/03/2020  [facility] Cardiologist: Glori Bickers, MD   Subjective   Feeling well. No chest pain, sob or palpitations.   Inpatient Medications    Scheduled Meds:  allopurinol  100 mg Oral Daily   amLODipine  5 mg Oral Daily   aspirin  81 mg Oral Daily   atorvastatin  80 mg Oral Daily   citalopram  20 mg Oral Daily   clopidogrel  75 mg Oral Q breakfast   enoxaparin (LOVENOX) injection  40 mg Subcutaneous Q24H   sodium chloride flush  3 mL Intravenous Q12H   sodium chloride flush  3 mL Intravenous Q12H   sodium chloride flush  3 mL Intravenous Q12H   Continuous Infusions:  sodium chloride     sodium chloride 100 mL/hr at 08/03/20 0331   sodium chloride     PRN Meds: sodium chloride, sodium chloride, acetaminophen, diazepam, nitroGLYCERIN, ondansetron (ZOFRAN) IV, sodium chloride flush, sodium chloride flush, zolpidem   Vital Signs    Vitals:   08/02/20 2039 08/03/20 0037 08/03/20 0504 08/03/20 0918  BP: 128/79 138/83 (!) 147/66 (!) 152/70  Pulse: 79 76 84 82  Resp: 16 19 16 18   Temp: 97.9 F (36.6 C)  98.5 F (36.9 C) 97.9 F (36.6 C)  TempSrc: Oral  Oral Oral  SpO2: 98%  97% 96%  Weight:      Height:        Intake/Output Summary (Last 24 hours) at 08/03/2020 1043 Last data filed at 08/03/2020 1000 Gross per 24 hour  Intake 240 ml  Output 270 ml  Net -30 ml   Last 3 Weights 08/02/2020 07/31/2020 07/27/2020  Weight (lbs) 167 lb 12.3 oz 156 lb 4.9 oz 165 lb  Weight (kg) 76.1 kg 70.9 kg 74.844 kg      Telemetry    SR, PVCs - Personally Reviewed  ECG    SR, prolonged PR inverval - Personally Reviewed  Physical Exam   GEN: No acute distress.   Neck: No JVD Cardiac: RRR, no murmurs, rubs, or gallops. Right radial cath site without hematoma  Respiratory: Clear to auscultation bilaterally. GI: Soft, nontender, non-distended  MS: No edema; No deformity. Neuro:  Nonfocal   Psych: Normal affect   Labs    High Sensitivity Troponin:   Recent Labs  Lab 07/31/20 1540 07/31/20 1825 07/31/20 2227 08/01/20 0320  TROPONINIHS 315* 316* 256* 175*      Chemistry Recent Labs  Lab 07/27/20 1215 07/31/20 1540 08/02/20 0638 08/03/20 0148  NA 136 138 138 136  K 4.3 4.2 3.7 4.4  CL 105 105 110 107  CO2 20* 21* 21* 17*  GLUCOSE 102* 91 87 84  BUN 19 19 14 11   CREATININE 1.54* 1.30* 1.29* 1.18  CALCIUM 8.9 9.5 8.2* 8.4*  PROT 7.7 9.5*  --   --   ALBUMIN 3.8 4.4  --   --   AST 31 40  --   --   ALT 26 22  --   --   ALKPHOS 75 79  --   --   BILITOT 1.9* 1.1  --   --   GFRNONAA 43* 53* 54* 60*  ANIONGAP 11 12 7 12      Hematology Recent Labs  Lab 08/01/20 0320 08/02/20 0638 08/03/20 0148  WBC 9.2 7.7 11.4*  RBC 3.58* 3.24* 3.57*  HGB 11.1* 10.0* 10.9*  HCT 33.5* 30.2* 33.3*  MCV 93.6 93.2 93.3  MCH 31.0 30.9 30.5  MCHC 33.1 33.1 32.7  RDW 13.6 13.6 13.6  PLT 390 418* 501*    BNPNo results for input(s): BNP, PROBNP in the last 168 hours.   DDimer No results for input(s): DDIMER in the last 168 hours.   Radiology    CARDIAC CATHETERIZATION  Result Date: 08/02/2020  Mid LAD-1 lesion is 40% stenosed.  1st Sept lesion is 80% stenosed.  Mid Cx to Dist Cx lesion is 20% stenosed.  LPAV lesion is 40% stenosed.  Lat 2nd Mrg lesion is 90% stenosed.  2nd Mrg-2 lesion is 80% stenosed.  2nd Mrg-1 lesion is 40% stenosed.  Prox RCA lesion is 85% stenosed.  Mid RCA-1 lesion is 50% stenosed.  Mid RCA-2 lesion is 85% stenosed.  Dist RCA lesion is 90% stenosed.  Post intervention, there is a 45% residual stenosis.  Post intervention, there is a 0% residual stenosis.  Mid LAD-2 lesion is 80% stenosed.  A stent was successfully placed.  Difficult but successful two-vessel coronary intervention with Cutting Balloon angioplasty of the 80% in-stent restenosis of the circumflex marginal vessel treated with a 2.5 x 10 mm Wolverine cutting balloon and  dilatation with a 3.0 x 15 mm noncompliant balloon with the 80% in-stent stenosis being fibrotic and reduced to approximately 45%.   The 80% mid LAD stenosis underwent successful stenting with a 5 x 15 mm Resolute Onyx stent postdilated to 2.6 mm with the stenosis being reduced to 0%. RECOMMENDATION: DAPT for minimum of 12 months.  Medical therapy for concomitant CAD and in particular the diffusely diseased RCA.  The patient will be hydrated post procedure.  Continue aggressive lipid-lowering therapy with target LDL in the 50s or below.  Optimal blood pressure management.   CARDIAC CATHETERIZATION  Result Date: 08/01/2020  Prox RCA lesion is 85% stenosed.  Mid RCA-1 lesion is 50% stenosed.  Mid RCA-2 lesion is 85% stenosed.  Dist RCA lesion is 90% stenosed.  Mid LAD-1 lesion is 40% stenosed.  Mid LAD-2 lesion is 80% stenosed.  Mid Cx to Dist Cx lesion is 20% stenosed.  LPAV lesion is 40% stenosed.  1st Sept lesion is 80% stenosed.  Lat 2nd Mrg lesion is 90% stenosed.  2nd Mrg-1 lesion is 40% stenosed.  2nd Mrg-2 lesion is 80% stenosed.  Multivessel CAD with coronary calcification.  The LAD had 3% stenosis in the proximal to mid segment and a focal 80% mid stenosis; the large circumflex marginal vessel has smooth 40% proximal stenosis and there is focal 80% in-stent restenosis in the circumflex stent.  There is distal branch stenosis of 90% and a very small caliber branch of this marginal vessel.  The AV groove circumflex has mild 20 and 40% stenoses; the right coronary artery is a small caliber nondominant tortuous vessel that has focal segmental stenoses of 85 to 90% in the midsegment, 80 and 90% distally with small caliber distal vessel. LVEDP 12 mmHg. RECOMMENDATION: With the patient's age of 87 years, and stage IIIb CKD with creatinine clearance at 39, recommend hydration overnight.  We will resume heparin therapy 8 hours post sheath pull.  Angiograms were reviewed with Dr. Angelena Form.  Will  initiate oral Plavix tomorrow morning with plans for two-vessel coronary intervention tomorrow following hydration involving the in-stent restenosis of the circumflex marginal vessel and mid LAD.  We will initially plan for medical therapy to the RCA which is diffusely diseased.   ECHOCARDIOGRAM COMPLETE  Result Date: 08/02/2020    ECHOCARDIOGRAM REPORT  Patient Name:   Kevin Schwartz [facility] Date of Exam: 08/01/2020 Medical Rec #:  [id number]               Height:       68.0 in Accession #:    [id number]              Weight:       156.3 lb Date of Birth:  Jan 30, 1931                BSA:          1.840 m Patient Age:    58 years                BP:           142/85 mmHg Patient Gender: M                       HR:           78 bpm. Exam Location:  Inpatient Procedure: 2D Echo, Cardiac Doppler, Color Doppler and Intracardiac            Opacification Agent Indications:    Acute Myocardial Infarction I21.9  History:        Patient has prior history of Echocardiogram examinations, most                 recent 04/18/2016. CAD, PAD; Risk Factors:Hypertension, Diabetes,                 Dyslipidemia and Sleep Apnea. CKD.  Sonographer:    Tiffany Dance Referring Phys: [id number] [place]  1. Left ventricular ejection fraction, by estimation, is 45 to 50%. The left ventricle has mildly decreased function. The left ventricle demonstrates global hypokinesis. There is moderate concentric left ventricular hypertrophy. Left ventricular diastolic function could not be evaluated. There is hypokinesis of the left ventricular, basal-mid anteroseptal wall.  2. Right ventricular systolic function is normal. The right ventricular size is normal. There is normal pulmonary artery systolic pressure. The estimated right ventricular systolic pressure is 06.2 mmHg.  3. The mitral valve is degenerative. Mild mitral valve regurgitation. No evidence of mitral stenosis.  4. The aortic valve is tricuspid. There is moderate  calcification of the aortic valve. There is moderate thickening of the aortic valve. Aortic valve regurgitation is mild. Mild to moderate aortic valve sclerosis/calcification is present, without any evidence of aortic stenosis. Aortic regurgitation PHT measures 573 msec.  5. The inferior vena cava is dilated in size with >50% respiratory variability, suggesting right atrial pressure of 8 mmHg.  6. A small pericardial effusion is present. The pericardial effusion is posterior to the left ventricle. There is no evidence of cardiac tamponade. FINDINGS  Left Ventricle: Left ventricular ejection fraction, by estimation, is 45 to 50%. The left ventricle has mildly decreased function. The left ventricle demonstrates global hypokinesis. The left ventricular internal cavity size was normal in size. There is  moderate concentric left ventricular hypertrophy. Left ventricular diastolic function could not be evaluated. Right Ventricle: The right ventricular size is normal. No increase in right ventricular wall thickness. Right ventricular systolic function is normal. There is normal pulmonary artery systolic pressure. The tricuspid regurgitant velocity is 2.25 m/s, and  with an assumed right atrial pressure of 8 mmHg, the estimated right ventricular systolic pressure is 69.4 mmHg. Left Atrium: Left atrial size was normal in size. Right Atrium: Right atrial size was normal in size. Pericardium: A small pericardial  effusion is present. The pericardial effusion is posterior to the left ventricle. There is no evidence of cardiac tamponade. Mitral Valve: The mitral valve is degenerative in appearance. There is mild thickening of the mitral valve leaflet(s). There is mild calcification of the mitral valve leaflet(s). Mild to moderate mitral annular calcification. Mild mitral valve regurgitation. No evidence of mitral valve stenosis. Tricuspid Valve: The tricuspid valve is normal in structure. Tricuspid valve regurgitation is mild .  No evidence of tricuspid stenosis. Aortic Valve: The aortic valve is tricuspid. There is moderate calcification of the aortic valve. There is moderate thickening of the aortic valve. Aortic valve regurgitation is mild. Aortic regurgitation PHT measures 573 msec. Mild to moderate aortic valve sclerosis/calcification is present, without any evidence of aortic stenosis. Pulmonic Valve: The pulmonic valve was normal in structure. Pulmonic valve regurgitation is not visualized. No evidence of pulmonic stenosis. Aorta: The aortic root is normal in size and structure. Venous: The inferior vena cava is dilated in size with greater than 50% respiratory variability, suggesting right atrial pressure of 8 mmHg. IAS/Shunts: No atrial level shunt detected by color flow Doppler.  LEFT VENTRICLE PLAX 2D LVIDd:         4.40 cm LVIDs:         3.40 cm LV PW:         1.50 cm LV IVS:        1.30 cm LVOT diam:     1.80 cm LV SV:         47 LV SV Index:   26 LVOT Area:     2.54 cm  RIGHT VENTRICLE          IVC RV Basal diam:  2.80 cm  IVC diam: 2.10 cm TAPSE (M-mode): 1.3 cm LEFT ATRIUM             Index       RIGHT ATRIUM           Index LA diam:        4.40 cm 2.39 cm/m  RA Area:     15.20 cm LA Vol (A2C):   43.7 ml 23.74 ml/m RA Volume:   35.60 ml  19.34 ml/m LA Vol (A4C):   56.3 ml 30.59 ml/m LA Biplane Vol: 50.2 ml 27.28 ml/m  AORTIC VALVE LVOT Vmax:   98.15 cm/s LVOT Vmean:  71.100 cm/s LVOT VTI:    0.186 m AI PHT:      573 msec  AORTA Ao Root diam: 3.30 cm Ao Asc diam:  3.30 cm MITRAL VALVE                TRICUSPID VALVE MV Area (PHT): 3.65 cm     TR Peak grad:   20.2 mmHg MV Decel Time: 208 msec     TR Vmax:        225.00 cm/s MV E velocity: 120.00 cm/s MV A velocity: 103.00 cm/s  SHUNTS MV E/A ratio:  1.17         Systemic VTI:  0.19 m                             Systemic Diam: 1.80 cm Fransico Him MD Electronically signed by Fransico Him MD Signature Date/Time: 08/02/2020/11:36:09 AM    Final     Cardiac Studies     CORONARY BALLOON ANGIOPLASTY  08/02/20  CORONARY STENT INTERVENTION    Conclusion    Mid LAD-1 lesion  is 40% stenosed. 1st Sept lesion is 80% stenosed. Mid Cx to Dist Cx lesion is 20% stenosed. LPAV lesion is 40% stenosed. Lat 2nd Mrg lesion is 90% stenosed. 2nd Mrg-2 lesion is 80% stenosed. 2nd Mrg-1 lesion is 40% stenosed. Prox RCA lesion is 85% stenosed. Mid RCA-1 lesion is 50% stenosed. Mid RCA-2 lesion is 85% stenosed. Dist RCA lesion is 90% stenosed. Post intervention, there is a 45% residual stenosis. Post intervention, there is a 0% residual stenosis. Mid LAD-2 lesion is 80% stenosed. A stent was successfully placed.   Difficult but successful two-vessel coronary intervention with Cutting Balloon angioplasty of the 80% in-stent restenosis of the circumflex marginal vessel treated with a 2.5 x 10 mm Wolverine cutting balloon and dilatation with a 3.0 x 15 mm noncompliant balloon with the 80% in-stent stenosis being fibrotic and reduced to approximately 45%.      The 80% mid LAD stenosis underwent successful stenting with a 5 x 15 mm Resolute Onyx stent postdilated to 2.6 mm with the stenosis being reduced to 0%.   RECOMMENDATION: DAPT for minimum of 12 months.  Medical therapy for concomitant CAD and in particular the diffusely diseased RCA.  The patient will be hydrated post procedure.  Continue aggressive lipid-lowering therapy with target LDL in the 50s or below.  Optimal blood pressure management.   Diagnostic Dominance: Co-dominant    Intervention      Echo 08/01/20  IMPRESSIONS     1. Left ventricular ejection fraction, by estimation, is 45 to 50%. The  left ventricle has mildly decreased function. The left ventricle  demonstrates global hypokinesis. There is moderate concentric left  ventricular hypertrophy. Left ventricular  diastolic function could not be evaluated. There is hypokinesis of the  left ventricular, basal-mid anteroseptal wall.   2.  Right ventricular systolic function is normal. The right ventricular  size is normal. There is normal pulmonary artery systolic pressure. The  estimated right ventricular systolic pressure is 76.7 mmHg.   3. The mitral valve is degenerative. Mild mitral valve regurgitation. No  evidence of mitral stenosis.   4. The aortic valve is tricuspid. There is moderate calcification of the  aortic valve. There is moderate thickening of the aortic valve. Aortic  valve regurgitation is mild. Mild to moderate aortic valve  sclerosis/calcification is present, without any  evidence of aortic stenosis. Aortic regurgitation PHT measures 573 msec.   5. The inferior vena cava is dilated in size with >50% respiratory  variability, suggesting right atrial pressure of 8 mmHg.   6. A small pericardial effusion is present. The pericardial effusion is  posterior to the left ventricle. There is no evidence of cardiac  Tamponade  Cardiac Cath 08/01/20  Prox RCA lesion is 85% stenosed. Mid RCA-1 lesion is 50% stenosed. Mid RCA-2 lesion is 85% stenosed. Dist RCA lesion is 90% stenosed. Mid LAD-1 lesion is 40% stenosed. Mid LAD-2 lesion is 80% stenosed. Mid Cx to Dist Cx lesion is 20% stenosed. LPAV lesion is 40% stenosed. 1st Sept lesion is 80% stenosed. Lat 2nd Mrg lesion is 90% stenosed. 2nd Mrg-1 lesion is 40% stenosed. 2nd Mrg-2 lesion is 80% stenosed.   Multivessel CAD with coronary calcification.  The LAD had 3% stenosis in the proximal to mid segment and a focal 80% mid stenosis; the large circumflex marginal vessel has smooth 40% proximal stenosis and there is focal 80% in-stent restenosis in the circumflex stent.  There is distal branch stenosis of 90% and a very small caliber branch of  this marginal vessel.  The AV groove circumflex has mild 20 and 40% stenoses; the right coronary artery is a small caliber nondominant tortuous vessel that has focal segmental stenoses of 85 to 90% in the midsegment, 80 and  90% distally with small caliber distal vessel.    LVEDP 12 mmHg.   RECOMMENDATION: With the patient's age of 28 years, and stage IIIb CKD with creatinine clearance at 39, recommend hydration overnight.  We will resume heparin therapy 8 hours post sheath pull.  Angiograms were reviewed with Dr. Angelena Form.  Will initiate oral Plavix tomorrow morning with plans for two-vessel coronary intervention tomorrow following hydration involving the in-stent restenosis of the circumflex marginal vessel and mid LAD.  We will initially plan for medical therapy to the RCA which is diffusely diseased.    Patient Profile     87 y.o. male with a hx of DM-2, HTN, HL, mild renal insuff/RAS with stent to Lt renal artery 2008, OSA (not on CPAP) CAD with hx multiple PCIs on OM-1, last cath 2007 now admitted 07/31/20 after presentation for Lt shoulder pain and cardiology seen the patient for NSTEMI  Assessment & Plan    1. NSTEMI - Hs-troponin peaked at 316. Cath 08/01/20 as above showing multivessel CAD. Given CKD>patient hydrated and heparinized. Echo showed low normal LVEF of 45-50% (was 50-55% in 2018). Underwent staged solverine cutting balloon for in-stent restenosis of the circumflex marginal and DES stenting to mLAD stenosis. Medical therapy for other disease.  - Plan for DAPT for 12 months - No BB given bradycardia - Continue Lipitor 80mg  qd - Ambulated well this AM x 2.   2. Bradycardia due to possible Mobitz II second degree heart block  - Dr. Lovena Le reviewed and on most recent EKG does appear to be Mobitz (no formal consult) - Home metoprolol has been stopped >> HR improved to 70-80s - Likely outpatient monitor   3. HLD - 08/01/2020: Cholesterol 106; HDL 39; LDL Cholesterol 57; Triglycerides 50; VLDL 10  - Continue high intensity statin   4. Low normal LVEF - No BB given #2 - No sign of volume overlaod  5. HTN - BP relatively stable on Norvac   6. CKD III - Scr stable   Likely DC later today  once seen by Dr. Margaretann Loveless.   For questions or updates, please contact [place] Please consult [email or web address] for contact info under        Jarrett Soho, PA  08/03/2020, 10:43 AM    Patient seen and examined with [facility] PA.  Agree as above, with the following exceptions and changes as noted below. Feeling well, anticipating discharge. Gen: NAD, CV: RRR, no murmurs, Lungs: clear, Abd: soft, Extrem: Warm, well perfused, no edema, Neuro/Psych: alert and oriented x 3, normal mood and affect. All available labs, radiology testing, previous records reviewed. BB has been stopped in setting of AVB and he feels well. Discussed with primary team, stable for DC from CV standpoint.  Elouise Munroe, MD

## 2020-08-03 NOTE — Care Management Important Message (Signed)
Important Message  Patient Details  Name: Kevin Schwartz MRN: [id number] Date of Birth: [birth year]   Medicare Important Message Given:  Yes     [name] 08/03/2020, 10:55 AM

## 2020-08-03 NOTE — Evaluation (Addendum)
Physical Therapy Evaluation Patient Details Name: Kevin Schwartz MRN: [id number] DOB: [birth year] Today's Date: 08/03/2020   History of Present Illness  Pt is an 87 y/o male admitted secondary to weakness, dizziness, and fall with L shoulder pain. Imaging negative for acute abnormality in L shoulder. Pt also found to have NSTEMI and is s/p heart cath on 4/20. PMH includes HTN, CAD s/p stent, and DM.  Clinical Impression  Pt admitted secondary to problem above with deficits below. Pt requiring min guard for mobility tasks using RW. Noted decreased safety awareness and required cues for safe use of RW. Pt currently lives alone in a motel, but reports his son checks on him frequently. Pt wanting to go home at d/c; recommending HHPT to increase safety and independence. Will continue to follow acutely.     Follow Up Recommendations Home health PT;Supervision for mobility/OOB    Equipment Recommendations  3in1 (PT);Other (comment) (rollator (4 wheeled walker with a seat))    Recommendations for Other Services       Precautions / Restrictions Precautions Precautions: Fall Precaution Comments: Pt reports no falls, but per chart has had a fall. Restrictions Weight Bearing Restrictions: No      Mobility  Bed Mobility Overal bed mobility: Needs Assistance Bed Mobility: Supine to Sit;Sit to Supine     Supine to sit: Supervision;HOB elevated Sit to supine: Supervision   General bed mobility comments: Supervision for safety. Increased time required to perform bed mobility tasks    Transfers Overall transfer level: Needs assistance Equipment used: Rolling walker (2 wheeled) Transfers: Sit to/from Stand Sit to Stand: Min guard;From elevated surface         General transfer comment: Pt unable to stand from lower surface and required elevated surface to stand. Min guard for safety.  Ambulation/Gait Ambulation/Gait assistance: Min guard Gait Distance (Feet): 50 Feet Assistive  device: Rolling walker (2 wheeled) Gait Pattern/deviations: Step-through pattern;Decreased stride length Gait velocity: decreased   General Gait Details: Cues for safety with use of RW. Min guard A for safety. Educated about using RW at home to increase safety.  Stairs            Wheelchair Mobility    Modified Rankin (Stroke Patients Only)       Balance Overall balance assessment: Needs assistance Sitting-balance support: No upper extremity supported;Feet supported Sitting balance-[name]: Fair     Standing balance support: Bilateral upper extremity supported;During functional activity Standing balance-[name]: Poor Standing balance comment: Reliant on BUE support                             Pertinent Vitals/Pain Pain Assessment: No/denies pain    Home Living Family/patient expects to be discharged to:: Other (Comment) (motel) Living Arrangements: Alone Available Help at Discharge: Family;Available PRN/intermittently (son checks on him) Type of Home: Other(Comment) (motel) Home Access: Level entry     Home Layout: One level Home Equipment: Cane - single point      Prior Function Level of Independence: Independent with assistive device(s)         Comments: Uses cane for ambulation. Reports he does not drive. Uses transportation or his son helps with taking him places.     Hand Dominance   Dominant Hand: Right    Extremity/Trunk Assessment   Upper Extremity Assessment Upper Extremity Assessment: Defer to OT evaluation    Lower Extremity Assessment Lower Extremity Assessment: Generalized weakness    Cervical /  Trunk Assessment Cervical / Trunk Assessment: Kyphotic  Communication   Communication: No difficulties  Cognition Arousal/Alertness: Awake/alert Behavior During Therapy: WFL for tasks assessed/performed Overall Cognitive Status: No family/caregiver present to determine baseline cognitive functioning                                  General Comments: Likely close to baseline. Decreased safety awareness noted.      General Comments      Exercises     Assessment/Plan    PT Assessment Patient needs continued PT services  PT Problem List Decreased strength;Decreased balance;Decreased mobility;Decreased knowledge of use of DME;Decreased safety awareness;Decreased activity tolerance;Decreased knowledge of precautions       PT Treatment Interventions DME instruction;Gait training;Functional mobility training;Therapeutic exercise;Therapeutic activities;Balance training;Patient/family education    PT Goals (Current goals can be found in the Care Plan section)  Acute Rehab PT Goals Patient Stated Goal: to go home PT Goal Formulation: With patient Time For Goal Achievement: 08/17/20 Potential to Achieve Goals: Good    Frequency Min 3X/week   Barriers to discharge        Co-evaluation               AM-PAC PT "6 Clicks" Mobility  Outcome Measure Help needed turning from your back to your side while in a flat bed without using bedrails?: None Help needed moving from lying on your back to sitting on the side of a flat bed without using bedrails?: A Little Help needed moving to and from a bed to a chair (including a wheelchair)?: A Little Help needed standing up from a chair using your arms (e.g., wheelchair or bedside chair)?: A Little Help needed to walk in hospital room?: A Little Help needed climbing 3-5 steps with a railing? : A Lot 6 Click Score: 18    End of Session Equipment Utilized During Treatment: Gait belt Activity Tolerance: Patient tolerated treatment well Patient left: in bed;with call bell/phone within reach;with bed alarm set Nurse Communication: Mobility status PT Visit Diagnosis: Unsteadiness on feet (R26.81);Muscle weakness (generalized) (M62.81)    Time: 0017-4944 PT Time Calculation (min) (ACUTE ONLY): 18 min   Charges:   PT Evaluation $PT Eval Low  Complexity: 1 Low          Lou Miner, DPT  Acute Rehabilitation Services  Pager: [phone] Office: [phone]   Rudean Hitt 08/03/2020, 1:47 PM

## 2020-08-06 ENCOUNTER — Telehealth: Payer: Self-pay | Admitting: *Deleted

## 2020-08-06 NOTE — Telephone Encounter (Signed)
Transition Care Management Follow-Up Telephone Call   Date discharged and where:08/03/2020 [place]  How have you been since you were released from the hospital? Recovering  Any patient concerns? No  Items Reviewed:   Meds: Yes  Allergies:Yes  Dietary Changes Reviewed:Yes  Functional Questionnaire:  Independent-I Dependent-D  ADLs:I with assistance   Dressing- I    Eating-I   Maintaining continence- I   Transferring-I with assistance   Transportation-D   Meal Prep- I   Managing Meds- I  Confirmed importance and Date/Time of follow-up visits scheduled:08/07/2020 with Dinah   Confirmed with patient if condition worsens to call PCP or go to the Emergency Dept. Patient was given office number and encouraged to call back with questions or concerns:  Yes

## 2020-08-07 ENCOUNTER — Other Ambulatory Visit: Payer: Self-pay

## 2020-08-07 ENCOUNTER — Other Ambulatory Visit: Payer: Self-pay | Admitting: Internal Medicine

## 2020-08-07 ENCOUNTER — Encounter: Payer: Self-pay | Admitting: Family

## 2020-08-07 ENCOUNTER — Ambulatory Visit (INDEPENDENT_AMBULATORY_CARE_PROVIDER_SITE_OTHER): Payer: Medicare Other | Admitting: Family

## 2020-08-07 VITALS — BP 128/80 | HR 75 | Temp 97.3°F | Resp 16 | Ht 68.0 in | Wt 171.6 lb

## 2020-08-07 DIAGNOSIS — I739 Peripheral vascular disease, unspecified: Secondary | ICD-10-CM

## 2020-08-07 DIAGNOSIS — I2510 Atherosclerotic heart disease of native coronary artery without angina pectoris: Secondary | ICD-10-CM

## 2020-08-07 DIAGNOSIS — E1122 Type 2 diabetes mellitus with diabetic chronic kidney disease: Secondary | ICD-10-CM

## 2020-08-07 DIAGNOSIS — I10 Essential (primary) hypertension: Secondary | ICD-10-CM

## 2020-08-07 DIAGNOSIS — E1169 Type 2 diabetes mellitus with other specified complication: Secondary | ICD-10-CM | POA: Diagnosis not present

## 2020-08-07 DIAGNOSIS — E782 Mixed hyperlipidemia: Secondary | ICD-10-CM

## 2020-08-07 DIAGNOSIS — N183 Chronic kidney disease, stage 3 unspecified: Secondary | ICD-10-CM

## 2020-08-07 NOTE — Patient Instructions (Signed)
-   continue on current medication  - continue on heart heathy diet   - Labs done today will call you with results

## 2020-08-07 NOTE — Progress Notes (Signed)
Provider: Amour Cutrone FNP-C   [name], MD  Patient Care Team: [name], MD as PCP - General (Family Medicine) Bensimhon, Shaune Pascal, MD as PCP - Cardiology (Cardiology) Bensimhon, Shaune Pascal, MD as Consulting Physician (Cardiology) Penninger, Ria Comment, [place] as Physician Assistant (Nephrology) Donato Heinz, MD as Consulting Physician (Nephrology) Michael Boston, MD as Consulting Physician (General Surgery) Ralene Bathe, MD as Consulting Physician (Ophthalmology)  Extended Emergency Contact Information Primary Emergency Contact: Alroy Bailiff, [place] Phone: [phone] Relation: Sister Secondary Emergency Contact: [place] Phone: [phone] Relation: Son  Code Status:  Full Code  Goals of care: Advanced Directive information Advanced Directives 08/07/2020  Does Patient Have a Medical Advance Directive? Yes  Type of Paramedic of [place];Living will  Does patient want to make changes to medical advance directive? No - Patient declined  Copy of [place] in Chart? No - copy requested  Would patient like information on creating a medical advance directive? -     Chief Complaint  Patient presents with  . Transitions Of Care    Hospitalization Follow Up 07/31/2020-08/03/2020    HPI:  Pt is a 87 y.o. male seen today for Transition of care post Hospitalization from 07/31/2020 - 08/03/2020 for NSTEMI related to PCI/stent thrombosis after he presented to [facility] the ED with complains of left shoulder pain after he felt weak,dizzy,lightheaded and fell on the ground on his right side in the morning prior to hospital visit.He had no loss of consciousness or head trauma.His EKG in the ED indicated no ischemic changes but Troponin was elevated at 300.2 D echo done showed LVEF 45-50 % with global hypokinesis involving the left ventricle.Cardiologist was consulted  and was admitted to acute unit.He was transferred to [facility] on 08/01/2020 for left heart Cath. He underwent Coronary Balloon angioplasty 08/02/20 coronary stent successful.DAPT was recommended for minimal of 12 months.along aggressive lipid lowering therapy LDL goal in the 50's ,CAD management.   He has a medical history of CAD s/p PCI,Type 2 DM,HTN,Hyperlipidemia RAS,CKD stage 2 ,ACD among other conditions. He denies any acute issues today.States doing well since hospital discharge.denies any chest pain,palpitation or shortness of breath  States home CBG in the 90's [facility] work reviewed with patient. Medication reviewed and reconciled.    Past Medical History:  Diagnosis Date  . Anemia due to chronic kidney disease   . Bilateral inguinal hernia   . CAD (coronary artery disease) cardiologist-  dr bensimhon   PTCA of OM in 1998, [place] OM in 2000, Cath 9/07 LM ok LAD ok. LCX 95% in OM prior to previous stent RCA. nondominant normal EF normal. [place] DES to OM 2007 (PLACED PROXIAMAL TO PREVIOUS STENT)  . Chronic kidney disease, stage III (moderate) [facility])    nephrologist-  dr detarding  . Depression   . Diabetes mellitus type 2, diet-controlled ([place])    followed by pcp,  last A1c 5.2 on 09-10-2017 in epic  . Gout, unspecified    10-29-2017  per pt stable  . HLD (hyperlipidemia)   . HTN (hypertension)   . MGUS (monoclonal gammopathy of unknown significance) previously followed by dr Beryle Beams , lov and released 08/01/2011   IgG kappa dx 2002 8% plasma cells in bone marrow; no lesions on bone X-rays;  . Nocturia   . OA (osteoarthritis)   . OSA on CPAP    per  study 01-30-2004  severe osa , AHI 51.6/hr  . PAD (peripheral artery disease) ([place])    05-21-2006  left renal artery stenosis, s/p balloon angioplasty and stenting;  last duplex 02/ 2012  normal , arteries patent  . S/P coronary artery stent placement    2000--  BMS x1  to OM;   2007-- DES x1  to OM proximal to  previous stent  . Secondary hyperparathyroidism of renal origin ([place])   . Urgency of urination   . Wears glasses    Past Surgical History:  Procedure Laterality Date  . CARDIOVASCULAR STRESS TEST  2010   per dr bensimhon epic note dated 04-18-2016  normal  . [place]   PTCA to OM  . CORONARY ANGIOPLASTY WITH STENT PLACEMENT  2000   PCI and BMS x1 OM  . CORONARY ANGIOPLASTY WITH STENT PLACEMENT  12-18-2005  dr Claiborne Billings   DES x1 to proximal to previously placed stent in OM  . CORONARY BALLOON ANGIOPLASTY N/A 08/02/2020   Procedure: CORONARY BALLOON ANGIOPLASTY;  Surgeon: Troy Sine, MD;  Location: [facility];  Service: Cardiovascular;  Laterality: N/A;  . CORONARY STENT INTERVENTION N/A 08/02/2020   Procedure: CORONARY STENT INTERVENTION;  Surgeon: Troy Sine, MD;  Location: [facility];  Service: Cardiovascular;  Laterality: N/A;  . HERNIA REPAIR    . INGUINAL HERNIA REPAIR Bilateral 10/30/2017   Procedure: LAPAROSCOPIC  BILATERAL INGUINAL HERNIA REPAIR  AND LEFT [place];  Surgeon: Michael Boston, MD;  Location: [facility];  Service: General;  Laterality: Bilateral;  . INSERTION OF MESH Bilateral 10/30/2017   Procedure: INSERTION OF MESH;  Surgeon: Michael Boston, MD;  Location: [facility];  Service: General;  Laterality: Bilateral;  . LEFT HEART CATH AND CORONARY ANGIOGRAPHY N/A 08/01/2020   Procedure: LEFT HEART CATH AND CORONARY ANGIOGRAPHY;  Surgeon: Troy Sine, MD;  Location: [place] [facility];  Service: Cardiovascular;  Laterality: N/A;  . RENAL ANGIOPLASTY Left 05-21-2006   dr berry   and stenting  . TOTAL KNEE ARTHROPLASTY Left 07-22-2005   dr Shellia Carwin  [facility]  . TRANSTHORACIC ECHOCARDIOGRAM  04/18/2016   ef 25-95%, grade 1 diastolic dysfunction/  mild to moderate AR/  mild MR    Allergies  Allergen Reactions  . Lisinopril Swelling and Other (See Comments)    Angioedema   . Losartan  Potassium Swelling and Other (See Comments)    Angioedema   . Crestor [Rosuvastatin] Swelling and Other (See Comments)    Angioedema   . Tape Other (See Comments)    Irritates the skin    Allergies as of 08/07/2020      Reactions   Lisinopril Swelling, Other (See Comments)   Angioedema   Losartan Potassium Swelling, Other (See Comments)   Angioedema   Crestor [rosuvastatin] Swelling, Other (See Comments)   Angioedema   Tape Other (See Comments)   Irritates the skin      Medication List       Accurate as of August 07, 2020  2:14 PM. If you have any questions, ask your nurse or doctor.        Accu-Chek Aviva Plus test strip Generic drug: glucose blood Use to check blood sugar daily. Dx:E11.8   Accu-Chek Softclix Lancets lancets Use to test blood sugar daily. Dx:E11.8   allopurinol 100 MG tablet Commonly known as: ZYLOPRIM Take 1 tablet (100 mg total) by mouth daily.   amLODipine 5 MG tablet  Commonly known as: NORVASC Take 1 tablet (5 mg total) by mouth daily.   aspirin EC 81 MG tablet Take 1 tablet (81 mg total) by mouth daily.   atorvastatin 80 MG tablet Commonly known as: LIPITOR Take 1 tablet (80 mg total) by mouth daily.   B-D SINGLE USE SWABS REGULAR Pads Use with testing of blood sugar. Dx:E11.8   citalopram 20 MG tablet Commonly known as: CELEXA Take 1 tablet (20 mg total) by mouth daily.   clopidogrel 75 MG tablet Commonly known as: PLAVIX Take 1 tablet (75 mg total) by mouth daily with breakfast.   diclofenac Sodium 1 % Gel Commonly known as: Voltaren Apply 4 g topically 4 (four) times daily.   lidocaine 5 % Commonly known as: Lidoderm Place 1 patch onto the skin daily. Remove & Discard patch within 12 hours or as directed by MD   nitroGLYCERIN 0.4 MG SL tablet Commonly known as: NITROSTAT DISSOLVE 1 TABLET UNDER THE TONGUE EVERY 5 MINUTES AS  NEEDED FOR CHEST PAIN. MAX  OF 3 TABLETS IN 15 MINUTES. CALL 911 IF PAIN PERSISTS.   Ultra-Thin  II Mini Pen Needle 31G X 5 MM Misc Generic drug: Insulin Pen Needle       Review of Systems  Constitutional: Negative for appetite change, chills, fatigue, fever and unexpected weight change.  HENT: Negative for congestion, dental problem, ear discharge, ear pain, facial swelling, hearing loss, nosebleeds, postnasal drip, rhinorrhea, sinus pressure, sinus pain, sneezing, sore throat, tinnitus and trouble swallowing.   Eyes: Negative for pain, discharge, redness, itching and visual disturbance.  Respiratory: Negative for cough, chest tightness, shortness of breath and wheezing.   Cardiovascular: Negative for chest pain, palpitations and leg swelling.  Gastrointestinal: Negative for abdominal distention, abdominal pain, blood in stool, constipation, diarrhea, nausea and vomiting.  Endocrine: Negative for cold intolerance, heat intolerance, polydipsia, polyphagia and polyuria.  Genitourinary: Negative for difficulty urinating, dysuria, flank pain, frequency and urgency.  Musculoskeletal: Positive for arthralgias and gait problem. Negative for back pain, joint swelling, myalgias, neck pain and neck stiffness.  Skin: Negative for color change, pallor, rash and wound.  Neurological: Negative for dizziness, syncope, speech difficulty, weakness, light-headedness, numbness and headaches.  Hematological: Does not bruise/bleed easily.  Psychiatric/Behavioral: Negative for agitation, behavioral problems, confusion, hallucinations, self-injury, sleep disturbance and suicidal ideas. The patient is not nervous/anxious.     Immunization History  Administered Date(s) Administered  . Fluad Quad(high Dose 65+) 01/06/2019, 02/16/2020  . Influenza, High Dose Seasonal PF 01/08/2017, 01/25/2018  . Influenza,inj,Quad PF,6+ Mos 12/27/2012, 03/06/2014, 04/02/2015, 11/30/2015  . PFIZER(Purple Top)SARS-COV-2 Vaccination 06/16/2019, 07/08/2019, 02/16/2020  . Pneumococcal Conjugate-13 09/22/2014  . Pneumococcal  Polysaccharide-23 12/27/2012  . Tdap 04/15/2011   Pertinent  Health Maintenance Due  Topic Date Due  . OPHTHALMOLOGY EXAM  09/16/2019  . INFLUENZA VACCINE  11/12/2020  . HEMOGLOBIN A1C  12/14/2020  . FOOT EXAM  02/15/2021  . URINE MICROALBUMIN  02/15/2021  . PNA vac Low Risk Adult  Completed   Fall Risk  08/07/2020 06/01/2020 02/16/2020 11/10/2019 09/05/2019  Falls in the past year? 0 0 0 0 1  Number falls in past yr: 0 0 0 - 0  Injury with Fall? 0 0 0 - 0  Risk Factor Category  - - - - -  Risk for fall due to : - - - - -  Follow up - - - - -   Functional Status Survey:    Vitals:   08/07/20 1327  BP: 128/80  Pulse: 75  Resp: 16  Temp: (!) 97.3 F (36.3 C)  SpO2: 95%  Weight: 171 lb 9.6 oz (77.8 kg)  Height: '5\' 8"'  (1.727 m)   Body mass index is 26.09 kg/m. Physical Exam Vitals reviewed.  Constitutional:      General: He is not in acute distress.    Appearance: Normal appearance. He is overweight. He is not ill-appearing or diaphoretic.  HENT:     Head: Normocephalic.     Right Ear: Tympanic membrane, ear canal and external ear normal. There is no impacted cerumen.     Left Ear: Tympanic membrane, ear canal and external ear normal. There is no impacted cerumen.     Nose: Nose normal. No congestion or rhinorrhea.     Mouth/Throat:     Mouth: Mucous membranes are moist.     Pharynx: Oropharynx is clear. No oropharyngeal exudate or posterior oropharyngeal erythema.  Eyes:     General: No scleral icterus.       Right eye: No discharge.        Left eye: No discharge.     Extraocular Movements: Extraocular movements intact.     Conjunctiva/sclera: Conjunctivae normal.     Pupils: Pupils are equal, round, and reactive to light.  Neck:     Vascular: No carotid bruit.  Cardiovascular:     Rate and Rhythm: Normal rate and regular rhythm.     Pulses:          Dorsalis pedis pulses are 1+ on the right side and 1+ on the left side.     Heart sounds: Murmur heard.  No  friction rub. No gallop.   Pulmonary:     Effort: Pulmonary effort is normal. No respiratory distress.     Breath sounds: Normal breath sounds. No wheezing, rhonchi or rales.  Chest:     Chest wall: No tenderness.  Abdominal:     General: Bowel sounds are normal. There is no distension.     Palpations: Abdomen is soft. There is no mass.     Tenderness: There is no abdominal tenderness. There is no right CVA tenderness, left CVA tenderness, guarding or rebound.  Musculoskeletal:        General: No swelling or tenderness. Normal range of motion.     Cervical back: Normal range of motion. No rigidity or tenderness.     Right lower leg: No edema.     Left lower leg: No edema.  Lymphadenopathy:     Cervical: No cervical adenopathy.  Skin:    General: Skin is warm and dry.     Coloration: Skin is not pale.     Findings: No bruising, erythema, lesion or rash.  Neurological:     Mental Status: He is alert and oriented to person, place, and time.     Cranial Nerves: No cranial nerve deficit.     Motor: No weakness.     Coordination: Coordination normal.     Gait: Gait abnormal.  Psychiatric:        Mood and Affect: Mood normal.        Speech: Speech normal.        Behavior: Behavior normal.        Thought Content: Thought content normal.        Judgment: Judgment normal.     Labs reviewed: Recent Labs    07/31/20 1540 08/02/20 0638 08/03/20 0148  NA 138 138 136  K 4.2 3.7 4.4  CL 105 110 107  CO2 21* 21* 17*  GLUCOSE 91 87 84  BUN '19 14 11  ' CREATININE 1.30* 1.29* 1.18  CALCIUM 9.5 8.2* 8.4*   Recent Labs    09/02/19 0954 07/27/20 1215 07/31/20 1540  AST 18 31 40  ALT '11 26 22  ' ALKPHOS  --  75 79  BILITOT 0.5 1.9* 1.1  PROT 7.1 7.7 9.5*  ALBUMIN  --  3.8 4.4   Recent Labs    06/13/20 0933 07/27/20 1215 07/31/20 1540 08/01/20 0320 08/02/20 0638 08/03/20 0148  WBC 7.1 13.5* 13.3* 9.2 7.7 11.4*  NEUTROABS 4,679 12.0* 11.1*  --   --   --   HGB 12.3* 13.0  13.8 11.1* 10.0* 10.9*  HCT 37.0* 39.8 42.3 33.5* 30.2* 33.3*  MCV 91.4 94.8 94.6 93.6 93.2 93.3  PLT 403* 339 421* 390 418* 501*   Lab Results  Component Value Date   TSH 2.483 07/31/2020   Lab Results  Component Value Date   HGBA1C 5.3 06/13/2020   Lab Results  Component Value Date   CHOL 106 08/01/2020   HDL 39 (L) 08/01/2020   LDLCALC 57 08/01/2020   TRIG 50 08/01/2020   CHOLHDL 2.7 08/01/2020    Significant Diagnostic Results in last 30 days:  DG Chest 1 View  Result Date: 07/31/2020 CLINICAL DATA:  Recent fall EXAM: CHEST  1 VIEW COMPARISON:  07/30/2015 FINDINGS: Cardiac shadow is enlarged. Aortic calcifications are noted. The lungs are well aerated bilaterally with mild central vascular congestion. No focal infiltrate or effusion is seen. No bony abnormality is noted. IMPRESSION: Changes of mild vascular congestion. No other focal abnormality is seen. Electronically Signed   By: Inez Catalina M.D.   On: 07/31/2020 15:36   CT Head Wo Contrast  Result Date: 07/31/2020 CLINICAL DATA:  Dizziness EXAM: CT HEAD WITHOUT CONTRAST TECHNIQUE: Contiguous axial images were obtained from the base of the skull through the vertex without intravenous contrast. COMPARISON:  CT brain 09/30/2010 FINDINGS: Brain: No acute territorial infarction, hemorrhage, or intracranial mass. Moderate atrophy. Extensive white matter hypodensity most likely chronic small vessel ischemic change. Slightly prominent ventricles felt secondary to atrophy. Vascular: No hyperdense vessels.  Carotid vascular calcification. Skull: No fracture Sinuses/Orbits: Left greater than right mastoid effusions. Other: Small right forehead scalp hematoma IMPRESSION: 1. No CT evidence for acute intracranial abnormality. Small right forehead scalp hematoma. 2. Atrophy and chronic small vessel ischemic changes of the white matter. 3. Left greater than right mastoid effusions. Electronically Signed   By: Donavan Foil M.D.   On: 07/31/2020  15:13   CARDIAC CATHETERIZATION  Result Date: 08/02/2020  Mid LAD-1 lesion is 40% stenosed.  1st Sept lesion is 80% stenosed.  Mid Cx to Dist Cx lesion is 20% stenosed.  LPAV lesion is 40% stenosed.  Lat 2nd Mrg lesion is 90% stenosed.  2nd Mrg-2 lesion is 80% stenosed.  2nd Mrg-1 lesion is 40% stenosed.  Prox RCA lesion is 85% stenosed.  Mid RCA-1 lesion is 50% stenosed.  Mid RCA-2 lesion is 85% stenosed.  Dist RCA lesion is 90% stenosed.  Post intervention, there is a 45% residual stenosis.  Post intervention, there is a 0% residual stenosis.  Mid LAD-2 lesion is 80% stenosed.  A stent was successfully placed.  Difficult but successful two-vessel coronary intervention with Cutting Balloon angioplasty of the 80% in-stent restenosis of the circumflex marginal vessel treated with a 2.5 x 10 mm Wolver[place] cutting balloon and dilatation with a 3.0 x 15 mm noncompliant balloon with  the 80% in-stent stenosis being fibrotic and reduced to approximately 45%.   The 80% mid LAD stenosis underwent successful stenting with a 5 x 15 mm Resolute Onyx stent postdilated to 2.6 mm with the stenosis being reduced to 0%. RECOMMENDATION: DAPT for minimum of 12 months.  Medical therapy for concomitant CAD and in particular the diffusely diseased RCA.  The patient will be hydrated post procedure.  Continue aggressive lipid-lowering therapy with target LDL in the 50s or below.  Optimal blood pressure management.   CARDIAC CATHETERIZATION  Result Date: 08/01/2020  Prox RCA lesion is 85% stenosed.  Mid RCA-1 lesion is 50% stenosed.  Mid RCA-2 lesion is 85% stenosed.  Dist RCA lesion is 90% stenosed.  Mid LAD-1 lesion is 40% stenosed.  Mid LAD-2 lesion is 80% stenosed.  Mid Cx to Dist Cx lesion is 20% stenosed.  LPAV lesion is 40% stenosed.  1st Sept lesion is 80% stenosed.  Lat 2nd Mrg lesion is 90% stenosed.  2nd Mrg-1 lesion is 40% stenosed.  2nd Mrg-2 lesion is 80% stenosed.  Multivessel CAD with  coronary calcification.  The LAD had 3% stenosis in the proximal to mid segment and a focal 80% mid stenosis; the large circumflex marginal vessel has smooth 40% proximal stenosis and there is focal 80% in-stent restenosis in the circumflex stent.  There is distal branch stenosis of 90% and a very small caliber branch of this marginal vessel.  The AV groove circumflex has mild 20 and 40% stenoses; the right coronary artery is a small caliber nondominant tortuous vessel that has focal segmental stenoses of 85 to 90% in the midsegment, 80 and 90% distally with small caliber distal vessel. LVEDP 12 mmHg. RECOMMENDATION: With the patient's age of 87 years, and stage IIIb CKD with creatinine clearance at 39, recommend hydration overnight.  We will resume heparin therapy 8 hours post sheath pull.  Angiograms were reviewed with Dr. Angelena Form.  Will initiate oral Plavix tomorrow morning with plans for two-vessel coronary intervention tomorrow following hydration involving the in-stent restenosis of the circumflex marginal vessel and mid LAD.  We will initially plan for medical therapy to the RCA which is diffusely diseased.   DG Shoulder Left  Result Date: 07/31/2020 CLINICAL DATA:  Left shoulder pain following recent fall, initial encounter EXAM: LEFT SHOULDER - 2+ VIEW COMPARISON:  None. FINDINGS: Degenerative changes of the acromioclavicular joint and glenohumeral joint are seen. No acute fracture or dislocation is noted. No soft tissue abnormality is seen. IMPRESSION: Degenerative change without acute abnormality. Electronically Signed   By: Inez Catalina M.D.   On: 07/31/2020 15:30   ECHOCARDIOGRAM COMPLETE  Result Date: 08/02/2020    ECHOCARDIOGRAM REPORT   Patient Name:   Kevin Schwartz [facility] Date of Exam: 08/01/2020 Medical Rec #:  [id number]               Height:       68.0 in Accession #:    [id number]              Weight:       156.3 lb Date of Birth:  Nov 04, 1930                BSA:          1.840 m Patient  Age:    29 years                BP:           142/85 mmHg Patient Gender: M  HR:           78 bpm. Exam Location:  Inpatient Procedure: 2D Echo, Cardiac Doppler, Color Doppler and Intracardiac            Opacification Agent Indications:    Acute Myocardial Infarction I21.9  History:        Patient has prior history of Echocardiogram examinations, most                 recent 04/18/2016. CAD, PAD; Risk Factors:Hypertension, Diabetes,                 Dyslipidemia and Sleep Apnea. CKD.  Sonographer:    Tiffany Dance Referring Phys: [id number] [place]  1. Left ventricular ejection fraction, by estimation, is 45 to 50%. The left ventricle has mildly decreased function. The left ventricle demonstrates global hypokinesis. There is moderate concentric left ventricular hypertrophy. Left ventricular diastolic function could not be evaluated. There is hypokinesis of the left ventricular, basal-mid anteroseptal wall.  2. Right ventricular systolic function is normal. The right ventricular size is normal. There is normal pulmonary artery systolic pressure. The estimated right ventricular systolic pressure is 17.6 mmHg.  3. The mitral valve is degenerative. Mild mitral valve regurgitation. No evidence of mitral stenosis.  4. The aortic valve is tricuspid. There is moderate calcification of the aortic valve. There is moderate thickening of the aortic valve. Aortic valve regurgitation is mild. Mild to moderate aortic valve sclerosis/calcification is present, without any evidence of aortic stenosis. Aortic regurgitation PHT measures 573 msec.  5. The inferior vena cava is dilated in size with >50% respiratory variability, suggesting right atrial pressure of 8 mmHg.  6. A small pericardial effusion is present. The pericardial effusion is posterior to the left ventricle. There is no evidence of cardiac tamponade. FINDINGS  Left Ventricle: Left ventricular ejection fraction, by estimation, is 45 to  50%. The left ventricle has mildly decreased function. The left ventricle demonstrates global hypokinesis. The left ventricular internal cavity size was normal in size. There is  moderate concentric left ventricular hypertrophy. Left ventricular diastolic function could not be evaluated. Right Ventricle: The right ventricular size is normal. No increase in right ventricular wall thickness. Right ventricular systolic function is normal. There is normal pulmonary artery systolic pressure. The tricuspid regurgitant velocity is 2.25 m/s, and  with an assumed right atrial pressure of 8 mmHg, the estimated right ventricular systolic pressure is 16.0 mmHg. Left Atrium: Left atrial size was normal in size. Right Atrium: Right atrial size was normal in size. Pericardium: A small pericardial effusion is present. The pericardial effusion is posterior to the left ventricle. There is no evidence of cardiac tamponade. Mitral Valve: The mitral valve is degenerative in appearance. There is mild thickening of the mitral valve leaflet(s). There is mild calcification of the mitral valve leaflet(s). Mild to moderate mitral annular calcification. Mild mitral valve regurgitation. No evidence of mitral valve stenosis. Tricuspid Valve: The tricuspid valve is normal in structure. Tricuspid valve regurgitation is mild . No evidence of tricuspid stenosis. Aortic Valve: The aortic valve is tricuspid. There is moderate calcification of the aortic valve. There is moderate thickening of the aortic valve. Aortic valve regurgitation is mild. Aortic regurgitation PHT measures 573 msec. Mild to moderate aortic valve sclerosis/calcification is present, without any evidence of aortic stenosis. Pulmonic Valve: The pulmonic valve was normal in structure. Pulmonic valve regurgitation is not visualized. No evidence of pulmonic stenosis. Aorta: The aortic root is normal in size and  structure. Venous: The inferior vena cava is dilated in size with greater  than 50% respiratory variability, suggesting right atrial pressure of 8 mmHg. IAS/Shunts: No atrial level shunt detected by color flow Doppler.  LEFT VENTRICLE PLAX 2D LVIDd:         4.40 cm LVIDs:         3.40 cm LV PW:         1.50 cm LV IVS:        1.30 cm LVOT diam:     1.80 cm LV SV:         47 LV SV Index:   26 LVOT Area:     2.54 cm  RIGHT VENTRICLE          IVC RV Basal diam:  2.80 cm  IVC diam: 2.10 cm TAPSE (M-mode): 1.3 cm LEFT ATRIUM             Index       RIGHT ATRIUM           Index LA diam:        4.40 cm 2.39 cm/m  RA Area:     15.20 cm LA Vol (A2C):   43.7 ml 23.74 ml/m RA Volume:   35.60 ml  19.34 ml/m LA Vol (A4C):   56.3 ml 30.59 ml/m LA Biplane Vol: 50.2 ml 27.28 ml/m  AORTIC VALVE LVOT Vmax:   98.15 cm/s LVOT Vmean:  71.100 cm/s LVOT VTI:    0.186 m AI PHT:      573 msec  AORTA Ao Root diam: 3.30 cm Ao Asc diam:  3.30 cm MITRAL VALVE                TRICUSPID VALVE MV Area (PHT): 3.65 cm     TR Peak grad:   20.2 mmHg MV Decel Time: 208 msec     TR Vmax:        225.00 cm/s MV E velocity: 120.00 cm/s MV A velocity: 103.00 cm/s  SHUNTS MV E/A ratio:  1.17         Systemic VTI:  0.19 m                             Systemic Diam: 1.80 cm Fransico Him MD Electronically signed by Fransico Him MD Signature Date/Time: 08/02/2020/11:36:09 AM    Final    CT Temporal Bones W Contrast  Result Date: 07/27/2020 CLINICAL DATA:  Mastoiditis; right ear pain with leukocytosis. Question mastoiditis. EXAM: CT TEMPORAL BONES WITH CONTRAST TECHNIQUE: Axial and coronal plane CT imaging of the petrous temporal bones was performed with thin-collimation image reconstruction after intravenous contrast administration. Multiplanar CT image reconstructions were also generated. CONTRAST:  51m OMNIPAQUE IOHEXOL 300 MG/ML  SOLN COMPARISON:  Head CT 09/30/2010 FINDINGS: RIGHT: Small amount of debris within the external auditory canal. The middle ear cavity is well-aerated. The ossicles are unremarkable. The inner ear  structures, internal auditory and facial nerve canals are normal. Moderate right mastoid effusion without coalescent osseous erosion. LEFT: Small amount of debris within the external auditory canal. The middle ear cavity is well-aerated. The ossicles are unremarkable. The inner ear structures, internal auditory and facial nerve canals are normal. Large left mastoid effusion without appreciable coalescent osseous erosion. OTHER: Prominent right TMJ osteoarthrosis. Generalized atrophy of the visualized brain with cerebral white matter chronic small vessel ischemic disease. No acute orbital finding. Trace scattered paranasal sinus mucosal thickening at the imaged  levels. IMPRESSION: Right temporal bone: 1. Moderate right mastoid effusion without coalescent osseous erosion. Correlate clinically for signs and symptoms of mastoiditis. 2. Small amount of debris within the right external auditory canal. 3. Otherwise unremarkable CT appearance of the right temporal bone. Left temporal bone: 1. Large left mastoid effusion without appreciable coalescent osseous erosion. Correlate clinically for signs and symptoms of mastoiditis. 2. Small amount of debris within the left external auditory canal. 3. Otherwise unremarkable CT appearance of the left temporal bone. Also of note, there is prominent right TMJ osteoarthrosis. Electronically Signed   By: Kellie Simmering DO   On: 07/27/2020 17:21    Assessment/Plan 1. Essential hypertension, benign B/p well controlled - continue on Amlodipine  - continue on ASA,statin   - CBC with Differential/Platelet - BMP with eGFR([facility]) - Ambulatory referral to Ophthalmology  2. Controlled type 2 diabetes mellitus with stage 3 chronic kidney disease, without long-term current use of insulin ([place]) Lab Results  Component Value Date   HGBA1C 5.3 06/13/2020  CBG well controlled. Diet controlled. Dietary modification and exercise as tolerated advised  - up to date with foot exam - on  statin   - Ambulatory referral to Ophthalmology for annual eye exam   3. Mixed hyperlipidemia due to type 2 diabetes mellitus (HCC) LDL at goal  - continue on Atorvastatin  - dietary modification and exercise as tolerated advised.   4. Atherosclerosis of native coronary artery of native heart without angina pectoris Chest pain free - continue to control high risk factors  - continue on Nitro as needed  - S/p stent placement  - continue on ASA,Statin and Plavix  - continue to follow up with Cardiologist as directed.   5. Peripheral vascular disease, unspecified ([place]) No ulceration. - continue to control high risk factors.  Family/ staff Communication: Reviewed plan of care with patient  Labs/tests ordered:  - CBC with Differential/Platelet - BMP with eGFR([facility])  Next Appointment : Has upcoming appointment with Dr.Miller   Sandrea Hughs, NP

## 2020-08-08 LAB — CBC WITH DIFFERENTIAL/PLATELET
Absolute Monocytes: 787 cells/uL (ref 200–950)
Basophils Absolute: 33 cells/uL (ref 0–200)
Basophils Relative: 0.4 %
Eosinophils Absolute: 312 cells/uL (ref 15–500)
Eosinophils Relative: 3.8 %
HCT: 33 % — ABNORMAL LOW (ref 38.5–50.0)
Hemoglobin: 10.5 g/dL — ABNORMAL LOW (ref 13.2–17.1)
Lymphs Abs: 1230 cells/uL (ref 850–3900)
MCH: 30.1 pg (ref 27.0–33.0)
MCHC: 31.8 g/dL — ABNORMAL LOW (ref 32.0–36.0)
MCV: 94.6 fL (ref 80.0–100.0)
MPV: 9.6 fL (ref 7.5–12.5)
Monocytes Relative: 9.6 %
Neutro Abs: 5838 cells/uL (ref 1500–7800)
Neutrophils Relative %: 71.2 %
Platelets: 465 10*3/uL — ABNORMAL HIGH (ref 140–400)
RBC: 3.49 10*6/uL — ABNORMAL LOW (ref 4.20–5.80)
RDW: 13.1 % (ref 11.0–15.0)
Total Lymphocyte: 15 %
WBC: 8.2 10*3/uL (ref 3.8–10.8)

## 2020-08-08 LAB — BASIC METABOLIC PANEL WITH GFR
BUN/Creatinine Ratio: 12 (calc) (ref 6–22)
BUN: 17 mg/dL (ref 7–25)
CO2: 17 mmol/L — ABNORMAL LOW (ref 20–32)
Calcium: 9.1 mg/dL (ref 8.6–10.3)
Chloride: 107 mmol/L (ref 98–110)
Creat: 1.38 mg/dL — ABNORMAL HIGH (ref 0.70–1.11)
GFR, Est African American: 53 mL/min/{1.73_m2} — ABNORMAL LOW (ref >=60)
GFR, Est Non African American: 46 mL/min/{1.73_m2} — ABNORMAL LOW (ref >=60)
Glucose, Bld: 80 mg/dL (ref 65–99)
Potassium: 4.9 mmol/L (ref 3.5–5.3)
Sodium: 137 mmol/L (ref 135–146)

## 2020-08-09 ENCOUNTER — Telehealth (HOSPITAL_COMMUNITY): Payer: Self-pay

## 2020-08-09 NOTE — Telephone Encounter (Signed)
Called and spoke with pt in regards to CR, pt stated he is not interested at this time.   Closed referral 

## 2020-08-24 ENCOUNTER — Encounter: Payer: Self-pay | Admitting: Radiology

## 2020-08-24 ENCOUNTER — Ambulatory Visit (INDEPENDENT_AMBULATORY_CARE_PROVIDER_SITE_OTHER): Payer: Medicare Other

## 2020-08-24 ENCOUNTER — Other Ambulatory Visit: Payer: Self-pay

## 2020-08-24 ENCOUNTER — Ambulatory Visit: Payer: Medicare Other | Admitting: Physician Assistant

## 2020-08-24 ENCOUNTER — Encounter: Payer: Self-pay | Admitting: Physician Assistant

## 2020-08-24 VITALS — BP 138/76 | HR 71 | Ht 68.0 in | Wt 160.0 lb

## 2020-08-24 DIAGNOSIS — I10 Essential (primary) hypertension: Secondary | ICD-10-CM | POA: Diagnosis not present

## 2020-08-24 DIAGNOSIS — I2510 Atherosclerotic heart disease of native coronary artery without angina pectoris: Secondary | ICD-10-CM | POA: Diagnosis not present

## 2020-08-24 DIAGNOSIS — E785 Hyperlipidemia, unspecified: Secondary | ICD-10-CM

## 2020-08-24 DIAGNOSIS — I441 Atrioventricular block, second degree: Secondary | ICD-10-CM

## 2020-08-24 DIAGNOSIS — E119 Type 2 diabetes mellitus without complications: Secondary | ICD-10-CM

## 2020-08-24 DIAGNOSIS — N183 Chronic kidney disease, stage 3 unspecified: Secondary | ICD-10-CM

## 2020-08-24 NOTE — Patient Instructions (Signed)
Medication Instructions:  Your physician recommends that you continue on your current medications as directed. Please refer to the Current Medication list given to you today.  *If you need a refill on your cardiac medications before your next appointment, please call your pharmacy*  Lab Work: Your physician recommends that you return for lab work in 2 months-10/24/20:   Fasting Lipid Panel-DO NOT EAT OR DRINK PAST MIDNIGHT. OKAY TO HAVE WATER.   Hepatic (Liver) Function Test  If you have labs (blood work) drawn today and your tests are completely normal, you will receive your results only by: [name] MyChart Message (if you have MyChart) OR . A paper copy in the mail If you have any lab test that is abnormal or we need to change your treatment, we will call you to review the results.  Testing/Procedures:  [facility]ull[facility]- Long Term Monitor Instructions   Your physician has requested you wear a ZIO patch monitor for 14 days.  This is a single patch monitor.   [employer] supplies one patch monitor per enrollment. Additional stickers are not available. Please do not apply patch if you will be having a Nuclear Stress Test, Echocardiogram, Cardiac CT, MRI, or Chest Xray during the period you would be wearing the monitor. The patch cannot be worn during these tests. You cannot remove and re-apply the ZIO XT patch monitor.  Your ZIO patch monitor will be sent Fed Ex from [employer] directly to your home address. It may take 3-5 days to receive your monitor after you have been enrolled.  Once you have received your monitor, please review the enclosed instructions. Your monitor has already been registered assigning a specific monitor serial # to you.  Billing and Patient Assistance Program Information   We have supplied [employer] with any of your insurance information on file for billing purposes. [employer] offers a sliding scale Patient Assistance Program for patients that do not have insurance, or whose  insurance does not completely cover the cost of the ZIO monitor.   You must apply for the Patient Assistance Program to qualify for this discounted rate.     To apply, please call [employer] at [phone], select option 4, then select option 2, and ask to apply for Patient Assistance Program.  Theodore Demark will ask your household income, and how many people are in your household.  They will quote your out-of-pocket cost based on that information.  [employer] will also be able to set up a 12-month, interest-free payment plan if needed.  Applying the monitor   Shave hair from upper left chest.  Hold abrader disc by orange tab. Rub abrader in 40 strokes over the upper left chest as indicated in your monitor instructions.  Clean area with 4 enclosed alcohol pads. Let dry.  Apply patch as indicated in monitor instructions. Patch will be placed under collarbone on left side of chest with arrow pointing upward.  Rub patch adhesive wings for 2 minutes. Remove white label marked "1". Remove the white label marked "2". Rub patch adhesive wings for 2 additional minutes.  While looking in a mirror, press and release button in center of patch. A small green light will flash 3-4 times. This will be your only indicator that the monitor has been turned on. ?  Do not shower for the first 24 hours. You may shower after the first 24 hours.  Press the button if you feel a symptom. You will hear a small click. Record Date, Time and Symptom in the Patient  Logbook.  When you are ready to remove the patch, follow instructions on the last 2 pages of the Patient Logbook. Stick patch monitor onto the last page of Patient Logbook.  Place Patient Logbook in the blue and white box.  Use locking tab on box and tape box closed securely.  The blue and white box has prepaid postage on it. Please place it in the mailbox as soon as possible. Your physician should have your test results approximately 7 days after the monitor has been mailed back to  [facility].  Call [facility] at [phone] if you have questions regarding your ZIO XT patch monitor. Call them immediately if you see an orange light blinking on your monitor.  If your monitor falls off in less than 4 days, contact our Monitor department at [phone]. ?If your monitor becomes loose or falls off after 4 days call [employer] at [phone] for suggestions on securing your monitor.?   Follow-Up: At [facility], you and your health needs are our priority.  As part of our continuing mission to provide you with exceptional heart care, we have created designated Provider Care Teams.  These Care Teams include your primary Cardiologist (physician) and Advanced Practice Providers (APPs -  Physician Assistants and Nurse Practitioners) who all work together to provide you with the care you need, when you need it.  We recommend signing up for the patient portal called "MyChart".  Sign up information is provided on this After Visit Summary.  MyChart is used to connect with patients for Virtual Visits (Telemedicine).  Patients are able to view lab/test results, encounter notes, upcoming appointments, etc.  Non-urgent messages can be sent to your provider as well.   To learn more about what you can do with MyChart, go to [email or web address].    Your next appointment:   Follow up as scheduled    The format for your next appointment:   In Person  Provider:   Glori Bickers   Other Instructions

## 2020-08-24 NOTE — Progress Notes (Signed)
Enrolled patient for a 14 day Zio XT Monitor to be mailed to patients home  

## 2020-08-24 NOTE — Progress Notes (Signed)
Cardiology Office Note:    Date:  08/25/2020   ID:  Kevin Schwartz, DOB 1931/01/10, MRN [id number]  PCP:  Kevin Honour, MD   [facility] Providers Cardiologist:  Kevin Bickers, MD {   Referring MD: Kevin Honour, MD   Chief Complaint  Patient presents with  . Follow-up    Seen for Kevin Schwartz    History of Present Illness:    Kevin Schwartz is a 87 y.o. male with a hx of HTN, HLD, DM 2, CKD stage III, RAS s/p stenting of L renal artery in 2008, and CAD s/p multiple PCI.  Patient underwent PTCA of OM in 1998.  He had BMS placement in the OM in 2000.  Last cardiac catheterization was in 2007 drug-eluting stent was placed in OM1.  Myoview in 2010 was okay.  Abdominal ultrasound in February 2012 showed normal renal artery and aorta.  Echocardiogram obtained in 2018 showed EF 55 to 60%, mild AI, no significant aortic stenosis.  He has been lost to follow-up since January 2018.  More recently Kevin Schwartz presented to the hospital on 07/31/2020 with left shoulder pain.  EKG showed no obvious ST-T wave changes however does reveal Mobitz 1 heart block, beta-blocker was stopped.  He was ruled in for NSTEMI.  Echocardiogram obtained on 08/01/2020 showed EF 45 to 50%, hypokinesis of the LV basal mid anteroseptal wall, RVSP 28.2 mmHg, mild MR, mild AI, small pericardial effusion.  Cardiac catheterization performed on 08/01/2020 revealed multivessel CAD with 85% proximal RCA, 85% mid RCA, 90% distal RCA, 80% mid LAD, 80% OM 2 lesion.  Patient was placed on aspirin and Plavix.  Patient returned to the Cath Lab on 08/02/2020 for staged PCI with DES to the mid LAD, balloon angioplasty of left circumflex lesion, the diffusely diseased RCA was treated medically.  Patient presents today for follow-up.  He has been compliant with aspirin and Plavix.  He denies any further left shoulder pain or chest discomfort.  Blood pressure is well controlled on amlodipine.  He is not on  beta-blocker due to intermittent second-degree AV block.  He denies any recent dizziness or feeling of passing out.  I recommended a 2-week heart monitor to make sure he does not have any prolonged pulses.  I was going to schedule the patient for a general cardiology follow-up, however patient already scheduled a 24-month follow-up with Kevin Schwartz.  For the time being I will keep his appointment with Kevin Schwartz.  He does not really have heart failure issues, ideally he should be referred to a general cardiologist.   Past Medical History:  Diagnosis Date  . Anemia due to chronic kidney disease   . Bilateral inguinal hernia   . CAD (coronary artery disease) cardiologist-  dr bensimhon   PTCA of OM in 1998, [place] OM in 2000, Cath 9/07 LM ok LAD ok. LCX 95% in OM prior to previous stent RCA. nondominant normal EF normal. [place] DES to OM 2007 (PLACED PROXIAMAL TO PREVIOUS STENT)  . Chronic kidney disease, stage III (moderate) [facility])    nephrologist-  dr detarding  . Depression   . Diabetes mellitus type 2, diet-controlled ([place])    followed by pcp,  last A1c 5.2 on 09-10-2017 in epic  . Gout, unspecified    10-29-2017  per pt stable  . HLD (hyperlipidemia)   . HTN (hypertension)   . MGUS (monoclonal gammopathy of unknown significance) previously followed by dr Beryle Beams , [facility] and released 08/01/2011  IgG kappa dx 2002 8% plasma cells in bone marrow; no lesions on bone X-rays;  . Nocturia   . OA (osteoarthritis)   . OSA on CPAP    per study 01-30-2004  severe osa , AHI 51.6/hr  . PAD (peripheral artery disease) ([place])    05-21-2006  left renal artery stenosis, s/p balloon angioplasty and stenting;  last duplex 02/ 2012  normal , arteries patent  . S/P coronary artery stent placement    2000--  BMS x1  to OM;   2007-- DES x1  to OM proximal to previous stent  . Secondary hyperparathyroidism of renal origin ([place])   . Urgency of urination   . Wears glasses     Past Surgical History:   Procedure Laterality Date  . CARDIOVASCULAR STRESS TEST  2010   per dr bensimhon epic note dated 04-18-2016  normal  . [place]   PTCA to OM  . CORONARY ANGIOPLASTY WITH STENT PLACEMENT  2000   PCI and BMS x1 OM  . CORONARY ANGIOPLASTY WITH STENT PLACEMENT  12-18-2005  dr Claiborne Billings   DES x1 to proximal to previously placed stent in OM  . CORONARY BALLOON ANGIOPLASTY N/A 08/02/2020   Procedure: CORONARY BALLOON ANGIOPLASTY;  Surgeon: Troy Sine, MD;  Location: [place] [facility];  Service: Cardiovascular;  Laterality: N/A;  . CORONARY STENT INTERVENTION N/A 08/02/2020   Procedure: CORONARY STENT INTERVENTION;  Surgeon: Troy Sine, MD;  Location: [place] [facility];  Service: Cardiovascular;  Laterality: N/A;  . HERNIA REPAIR    . INGUINAL HERNIA REPAIR Bilateral 10/30/2017   Procedure: LAPAROSCOPIC  BILATERAL INGUINAL HERNIA REPAIR  AND LEFT [place];  Surgeon: Michael Boston, MD;  Location: [place];  Service: General;  Laterality: Bilateral;  . INSERTION OF MESH Bilateral 10/30/2017   Procedure: INSERTION OF MESH;  Surgeon: Michael Boston, MD;  Location: [place];  Service: General;  Laterality: Bilateral;  . LEFT HEART CATH AND CORONARY ANGIOGRAPHY N/A 08/01/2020   Procedure: LEFT HEART CATH AND CORONARY ANGIOGRAPHY;  Surgeon: Troy Sine, MD;  Location: [place] [facility];  Service: Cardiovascular;  Laterality: N/A;  . RENAL ANGIOPLASTY Left 05-21-2006   dr berry   and stenting  . TOTAL KNEE ARTHROPLASTY Left 07-22-2005   dr Shellia Carwin  [facility]  . TRANSTHORACIC ECHOCARDIOGRAM  04/18/2016   ef 47-65%, grade 1 diastolic dysfunction/  mild to moderate AR/  mild MR    Current Medications: Current Meds  Medication Sig  . ACCU-CHEK SOFTCLIX LANCETS lancets Use to test blood sugar daily. Dx:E11.8  . Alcohol Swabs (B-D SINGLE USE SWABS REGULAR) PADS Use with testing of blood sugar. Dx:E11.8  . allopurinol (ZYLOPRIM) 100 MG  tablet Take 1 tablet (100 mg total) by mouth daily.  [name] amLODipine (NORVASC) 5 MG tablet Take 1 tablet (5 mg total) by mouth daily.  [name] aspirin EC 81 MG tablet Take 1 tablet (81 mg total) by mouth daily.  [name] atorvastatin (LIPITOR) 80 MG tablet Take 1 tablet (80 mg total) by mouth daily.  . citalopram (CELEXA) 20 MG tablet Take 1 tablet (20 mg total) by mouth daily.  . clopidogrel (PLAVIX) 75 MG tablet Take 1 tablet (75 mg total) by mouth daily with breakfast.  . diclofenac Sodium (VOLTAREN) 1 % GEL Apply 4 g topically 4 (four) times daily.  [name] glucose blood (ACCU-CHEK AVIVA PLUS) test strip Use to check blood sugar daily. Dx:E11.8  . lidocaine (LIDODERM) 5 %  Place 1 patch onto the skin daily. Remove & Discard patch within 12 hours or as directed by MD  . nitroGLYCERIN (NITROSTAT) 0.4 MG SL tablet DISSOLVE 1 TABLET UNDER THE TONGUE EVERY 5 MINUTES AS  NEEDED FOR CHEST PAIN. MAX  OF 3 TABLETS IN 15 MINUTES. CALL 911 IF PAIN PERSISTS.  [name] ULTRA-THIN II MINI PEN NEEDLE 31G X 5 MM MISC      Allergies:   Lisinopril, Losartan potassium, Crestor [rosuvastatin], and Tape   Social History   Socioeconomic History  . Marital status: Widowed    Spouse name: Not on file  . Number of children: 1  . Years of education: Not on file  . Highest education level: Not on file  Occupational History  . Occupation: retired   Tobacco Use  . Smoking status: Current Some Day Smoker    Packs/day: 0.25    Types: Cigarettes  . Smokeless tobacco: Never Used  . Tobacco comment: 10-29-2017  per pt was smoking 1pp2d, stopped June 2019  Vaping Use  . Vaping Use: Never used  Substance and Sexual Activity  . Alcohol use: Not Currently  . Drug use: No  . Sexual activity: Not on file  Other Topics Concern  . Not on file  Social History Narrative   Widowed 02/2012   Lives along   Stopped smoking 2004   Alcohol none   Exercise works in [place] Strain: Not on file  Food Insecurity: Not on file  Transportation Needs: Not on file  Physical Activity: Not on file  Stress: Not on file  Social Connections: Not on file     Family History: The patient's family history includes Cancer in his sister and another family member; Coronary artery disease in an other family member; Diabetes in his brother, brother and another family member; Healthy in his father; Heart disease in his mother, sister, and sister; Hypertension in his sister.  ROS:   Please see the history of present illness.     All other systems reviewed and are negative.  EKGs/Labs/Other Studies Reviewed:    The following studies were reviewed today:  Echo 08/01/2020 1. Left ventricular ejection fraction, by estimation, is 45 to 50%. The  left ventricle has mildly decreased function. The left ventricle  demonstrates global hypokinesis. There is moderate concentric left  ventricular hypertrophy. Left ventricular  diastolic function could not be evaluated. There is hypokinesis of the  left ventricular, basal-mid anteroseptal wall.  2. Right ventricular systolic function is normal. The right ventricular  size is normal. There is normal pulmonary artery systolic pressure. The  estimated right ventricular systolic pressure is 51.8 mmHg.  3. The mitral valve is degenerative. Mild mitral valve regurgitation. No  evidence of mitral stenosis.  4. The aortic valve is tricuspid. There is moderate calcification of the  aortic valve. There is moderate thickening of the aortic valve. Aortic  valve regurgitation is mild. Mild to moderate aortic valve  sclerosis/calcification is present, without any  evidence of aortic stenosis. Aortic regurgitation PHT measures 573 msec.  5. The inferior vena cava is dilated in size with >50% respiratory  variability, suggesting right atrial pressure of 8 mmHg.  6. A small pericardial effusion is present. The pericardial effusion is   posterior to the left ventricle. There is no evidence of cardiac  tamponade.    Cath 08/01/2020  Prox RCA lesion is 85% stenosed.  Mid RCA-1 lesion is 50% stenosed.  Mid RCA-2 lesion is 85% stenosed.  Dist RCA lesion is 90% stenosed.  Mid LAD-1 lesion is 40% stenosed.  Mid LAD-2 lesion is 80% stenosed.  Mid Cx to Dist Cx lesion is 20% stenosed.  LPAV lesion is 40% stenosed.  1st Sept lesion is 80% stenosed.  Lat 2nd Mrg lesion is 90% stenosed.  2nd Mrg-1 lesion is 40% stenosed.  2nd Mrg-2 lesion is 80% stenosed.   Multivessel CAD with coronary calcification.  The LAD had 3% stenosis in the proximal to mid segment and a focal 80% mid stenosis; the large circumflex marginal vessel has smooth 40% proximal stenosis and there is focal 80% in-stent restenosis in the circumflex stent.  There is distal branch stenosis of 90% and a very small caliber branch of this marginal vessel.  The AV groove circumflex has mild 20 and 40% stenoses; the right coronary artery is a small caliber nondominant tortuous vessel that has focal segmental stenoses of 85 to 90% in the midsegment, 80 and 90% distally with small caliber distal vessel.   LVEDP 12 mmHg.  RECOMMENDATION: With the patient's age of 63 years, and stage IIIb CKD with creatinine clearance at 39, recommend hydration overnight.  We will resume heparin therapy 8 hours post sheath pull.  Angiograms were reviewed with Dr. Angelena Form.  Will initiate oral Plavix tomorrow morning with plans for two-vessel coronary intervention tomorrow following hydration involving the in-stent restenosis of the circumflex marginal vessel and mid LAD.  We will initially plan for medical therapy to the RCA which is diffusely diseased.   Cath 08/02/2020  Mid LAD-1 lesion is 40% stenosed.  1st Sept lesion is 80% stenosed.  Mid Cx to Dist Cx lesion is 20% stenosed.  LPAV lesion is 40% stenosed.  Lat 2nd Mrg lesion is 90% stenosed.  2nd Mrg-2 lesion is  80% stenosed.  2nd Mrg-1 lesion is 40% stenosed.  Prox RCA lesion is 85% stenosed.  Mid RCA-1 lesion is 50% stenosed.  Mid RCA-2 lesion is 85% stenosed.  Dist RCA lesion is 90% stenosed.  Post intervention, there is a 45% residual stenosis.  Post intervention, there is a 0% residual stenosis.  Mid LAD-2 lesion is 80% stenosed.  A stent was successfully placed.   Difficult but successful two-vessel coronary intervention with Cutting Balloon angioplasty of the 80% in-stent restenosis of the circumflex marginal vessel treated with a 2.5 x 10 mm Wolverine cutting balloon and dilatation with a 3.0 x 15 mm noncompliant balloon with the 80% in-stent stenosis being fibrotic and reduced to approximately 45%.     The 80% mid LAD stenosis underwent successful stenting with a 5 x 15 mm Resolute Onyx stent postdilated to 2.6 mm with the stenosis being reduced to 0%.  RECOMMENDATION: DAPT for minimum of 12 months.  Medical therapy for concomitant CAD and in particular the diffusely diseased RCA.  The patient will be hydrated post procedure.  Continue aggressive lipid-lowering therapy with target LDL in the 50s or below.  Optimal blood pressure management.    EKG:  EKG is ordered today.  The ekg ordered today demonstrates sinus rhythm with second-degree AV block Mobitz 1.  Recent Labs: 07/31/2020: ALT 22; TSH 2.483 08/07/2020: BUN 17; Creat 1.38; Hemoglobin 10.5; Platelets 465; Potassium 4.9; Sodium 137  Recent Lipid Panel    Component Value Date/Time   CHOL 106 08/01/2020 0320   CHOL 244 (H) 04/30/2015 1017   TRIG 50 08/01/2020 0320   HDL 39 (L)  08/01/2020 0320   HDL 55 04/30/2015 1017   CHOLHDL 2.7 08/01/2020 0320   VLDL 10 08/01/2020 0320   LDLCALC 57 08/01/2020 0320   LDLCALC 80 05/05/2019 1005     Risk Assessment/Calculations:       Physical Exam:    VS:  BP 138/76   Pulse 71   Ht 5\' 8"  (1.727 m)   Wt 160 lb (72.6 kg)   SpO2 99%   BMI 24.33 kg/m     Wt Readings  from Last 3 Encounters:  08/24/20 160 lb (72.6 kg)  08/07/20 171 lb 9.6 oz (77.8 kg)  08/02/20 167 lb 12.3 oz (76.1 kg)     GEN:  Well nourished, well developed in no acute distress HEENT: Normal NECK: No JVD; No carotid bruits LYMPHATICS: No lymphadenopathy CARDIAC: RRR, no murmurs, rubs, gallops RESPIRATORY:  Clear to auscultation without rales, wheezing or rhonchi  ABDOMEN: Soft, non-tender, non-distended MUSCULOSKELETAL:  No edema; No deformity  SKIN: Warm and dry NEUROLOGIC:  Alert and oriented x 3 PSYCHIATRIC:  Normal affect   ASSESSMENT:    1. AV block, 2nd degree   2. Coronary artery disease involving native coronary artery of native heart without angina pectoris   3. Hyperlipidemia, unspecified hyperlipidemia type   4. Essential hypertension, benign   5. Controlled type 2 diabetes mellitus without complication, without long-term current use of insulin (HCC)   6. Stage 3 chronic kidney disease, unspecified whether stage 3a or 3b CKD ([place])    PLAN:    In order of problems listed above:  1. Second-degree AV block type I: Recommend a 2-week heart monitor to assess for prolonged pulses and more advanced heart block.  2. CAD: Recent cardiac catheterization on 08/02/2020 with balloon angioplasty of left circumflex artery and stent placement in the LAD.  On aspirin and Plavix  3. Hyperlipidemia: On Lipitor  4. Hypertension: Blood pressure well controlled  5. DM2: Managed by primary care provider  6. Stage III CKD: Stable renal function.    Medication Adjustments/Labs and Tests Ordered: Current medicines are reviewed at length with the patient today.  Concerns regarding medicines are outlined above.  Orders Placed This Encounter  Procedures  . Lipid panel  . Hepatic function panel  . LONG TERM MONITOR (3-14 DAYS)  . EKG 12-Lead   No orders of the defined types were placed in this encounter.   Patient Instructions  Medication Instructions:  Your physician  recommends that you continue on your current medications as directed. Please refer to the Current Medication list given to you today.  *If you need a refill on your cardiac medications before your next appointment, please call your pharmacy*  Lab Work: Your physician recommends that you return for lab work in 2 months-10/24/20:   Fasting Lipid Panel-DO NOT EAT OR DRINK PAST MIDNIGHT. OKAY TO HAVE WATER.   Hepatic (Liver) Function Test  If you have labs (blood work) drawn today and your tests are completely normal, you will receive your results only by: [name] MyChart Message (if you have MyChart) OR . A paper copy in the mail If you have any lab test that is abnormal or we need to change your treatment, we will call you to review the results.  Testing/Procedures:  [place] [place]- Long Term Monitor Instructions   Your physician has requested you wear a ZIO patch monitor for 14 days.  This is a single patch monitor.   [employer] supplies one patch monitor per enrollment. Additional stickers are not available.  Please do not apply patch if you will be having a Nuclear Stress Test, Echocardiogram, Cardiac CT, MRI, or Chest Xray during the period you would be wearing the monitor. The patch cannot be worn during these tests. You cannot remove and re-apply the ZIO XT patch monitor.  Your ZIO patch monitor will be sent Fed Ex from [employer] directly to your home address. It may take 3-5 days to receive your monitor after you have been enrolled.  Once you have received your monitor, please review the enclosed instructions. Your monitor has already been registered assigning a specific monitor serial # to you.  Billing and Patient Assistance Program Information   We have supplied [employer] with any of your insurance information on file for billing purposes. [employer] offers a sliding scale Patient Assistance Program for patients that do not have insurance, or whose insurance does not completely cover the cost  of the ZIO monitor.   You must apply for the Patient Assistance Program to qualify for this discounted rate.     To apply, please call [employer] at [phone], select option 4, then select option 2, and ask to apply for Patient Assistance Program.  Theodore Demark will ask your household income, and how many people are in your household.  They will quote your out-of-pocket cost based on that information.  [employer] will also be able to set up a 58-month, interest-free payment plan if needed.  Applying the monitor   Shave hair from upper left chest.  Hold abrader disc by orange tab. Rub abrader in 40 strokes over the upper left chest as indicated in your monitor instructions.  Clean area with 4 enclosed alcohol pads. Let dry.  Apply patch as indicated in monitor instructions. Patch will be placed under collarbone on left side of chest with arrow pointing upward.  Rub patch adhesive wings for 2 minutes. Remove white label marked "1". Remove the white label marked "2". Rub patch adhesive wings for 2 additional minutes.  While looking in a mirror, press and release button in center of patch. A small green light will flash 3-4 times. This will be your only indicator that the monitor has been turned on. ?  Do not shower for the first 24 hours. You may shower after the first 24 hours.  Press the button if you feel a symptom. You will hear a small click. Record Date, Time and Symptom in the Patient Logbook.  When you are ready to remove the patch, follow instructions on the last 2 pages of the Patient Logbook. Stick patch monitor onto the last page of Patient Logbook.  Place Patient Logbook in the blue and white box.  Use locking tab on box and tape box closed securely.  The blue and white box has prepaid postage on it. Please place it in the mailbox as soon as possible. Your physician should have your test results approximately 7 days after the monitor has been mailed back to [facility].  Call [place] at [phone] if you have questions regarding your ZIO XT patch monitor. Call them immediately if you see an orange light blinking on your monitor.  If your monitor falls off in less than 4 days, contact our Monitor department at [phone]. ?If your monitor becomes loose or falls off after 4 days call [employer] at [phone] for suggestions on securing your monitor.?   Follow-Up: At [facility], you and your health needs are our priority.  As part of our continuing mission to provide you  with exceptional heart care, we have created designated Provider Care Teams.  These Care Teams include your primary Cardiologist (physician) and Advanced Practice Providers (APPs -  Physician Assistants and Nurse Practitioners) who all work together to provide you with the care you need, when you need it.  We recommend signing up for the patient portal called "MyChart".  Sign up information is provided on this After Visit Summary.  MyChart is used to connect with patients for Virtual Visits (Telemedicine).  Patients are able to view lab/test results, encounter notes, upcoming appointments, etc.  Non-urgent messages can be sent to your provider as well.   To learn more about what you can do with MyChart, go to [email or web address].    Your next appointment:   Follow up as scheduled    The format for your next appointment:   In Person  Provider:   Glori Schwartz   Other Instructions      Signed, Almyra Deforest, [place]  08/25/2020 11:15 PM    [place]

## 2020-08-25 ENCOUNTER — Encounter: Payer: Self-pay | Admitting: Physician Assistant

## 2020-08-28 DIAGNOSIS — I441 Atrioventricular block, second degree: Secondary | ICD-10-CM

## 2020-09-19 ENCOUNTER — Telehealth: Payer: Self-pay | Admitting: Cardiovascular Disease

## 2020-09-19 DIAGNOSIS — I441 Atrioventricular block, second degree: Secondary | ICD-10-CM | POA: Diagnosis not present

## 2020-09-19 NOTE — Telephone Encounter (Signed)
Received a call from [place] with [employer] calling to report on 09/10/20 at 8:39 am patient had slow afib rate 40 lasting appox 60 sec.Report to be posted.Message sent to [facility] PA for advice if patient can be added to his schedule.

## 2020-09-19 NOTE — Telephone Encounter (Signed)
Christina from [employer] calling to provide critical results

## 2020-09-21 NOTE — Telephone Encounter (Signed)
Received a call back from patient.Appointment scheduled with Almyra Deforest PA 09/26/20 at 1:15 pm to discuss monitor results.

## 2020-09-21 NOTE — Telephone Encounter (Signed)
Called patient left message on personal voice mail to call me back.Spoke to [employer] PA he advised to schedule appointment next week with him to discuss monitor results.

## 2020-09-26 ENCOUNTER — Ambulatory Visit (INDEPENDENT_AMBULATORY_CARE_PROVIDER_SITE_OTHER): Payer: Medicare Other | Admitting: Physician Assistant

## 2020-09-26 ENCOUNTER — Other Ambulatory Visit: Payer: Self-pay

## 2020-09-26 VITALS — BP 140/70 | HR 82 | Ht 68.0 in | Wt 159.0 lb

## 2020-09-26 DIAGNOSIS — I10 Essential (primary) hypertension: Secondary | ICD-10-CM | POA: Diagnosis not present

## 2020-09-26 DIAGNOSIS — I4892 Unspecified atrial flutter: Secondary | ICD-10-CM | POA: Diagnosis not present

## 2020-09-26 DIAGNOSIS — E785 Hyperlipidemia, unspecified: Secondary | ICD-10-CM | POA: Diagnosis not present

## 2020-09-26 DIAGNOSIS — I2510 Atherosclerotic heart disease of native coronary artery without angina pectoris: Secondary | ICD-10-CM

## 2020-09-26 DIAGNOSIS — E119 Type 2 diabetes mellitus without complications: Secondary | ICD-10-CM

## 2020-09-26 DIAGNOSIS — R9431 Abnormal electrocardiogram [ECG] [EKG]: Secondary | ICD-10-CM

## 2020-09-26 NOTE — Progress Notes (Signed)
Prior Cardiology Office Note:    Date:  09/28/2020   ID:  Kevin Schwartz, DOB 03-09-1931, MRN [id number]  PCP:  [name], MD   [facility] Providers Cardiologist:  Glori Bickers, MD     Referring MD: [name], MD   Chief Complaint  Patient presents with   Follow-up    Seen for Dr. Haroldine Laws    History of Present Illness:    Kevin Schwartz is a 87 y.o. male with a hx of HTN, HLD, DM 2, CKD stage III, RAS s/p stenting of L renal artery in 2008, and CAD s/p multiple PCI.  Patient underwent PTCA of OM in 1998.  He had BMS placement in the OM in 2000.  Cardiac catheterization was in 2007 drug-eluting stent was placed in OM1.  Myoview in 2010 was okay.  Abdominal ultrasound in February 2012 showed normal renal artery and aorta.  Echocardiogram obtained in 2018 showed EF 55 to 60%, mild AI, no significant aortic stenosis. More recently Kevin Schwartz presented to the hospital on 07/31/2020 with left shoulder pain.  EKG showed no obvious ST-T wave changes however does reveal Mobitz 1 heart block, beta-blocker was stopped.  He was ruled in for NSTEMI.  Echocardiogram obtained on 08/01/2020 showed EF 45 to 50%, hypokinesis of the LV basal mid anteroseptal wall, RVSP 28.2 mmHg, mild MR, mild AI, small pericardial effusion.  Cardiac catheterization performed on 08/01/2020 revealed multivessel CAD with 85% proximal RCA, 85% mid RCA, 90% distal RCA, 80% mid LAD, 80% OM 2 lesion.  Patient was placed on aspirin and Plavix.  Patient returned to the Cath Lab on 08/02/2020 for staged PCI with DES to the mid LAD, balloon angioplasty of left circumflex lesion, the diffusely diseased RCA was treated medically.  I last saw the patient on 08/24/2020, at which time he denied any dizziness or feeling of passing out.  He was not on beta-blocker due to intermittent second-degree AV block.  I recommended a heart monitor to make sure he did not have any prolonged pauses.  He presents today  for follow-up.  I discussed the heart monitor report with the patient.  He was having Mobitz 1, occasional junctional rhythm, nonsustained VT and quite significant atrial flutter which is new for him.  He has no cardiac awareness.  He denies any dizziness or feeling of passing out.  I reviewed the heart monitor result with Dr. Claiborne Billings, because overall occasional junctional rhythm and a Mobitz 1, I cannot add any rate control medication to his medical regimen.  Atrial flutter seems to have slow ventricular rate as well.  Dr. Claiborne Billings recommended referral to EP service to monitor, he may ended up needing a pacemaker in the distant future.  As for atrial flutter, we recommended discontinuation of aspirin and start him on Eliquis 5 mg twice a day.  Otherwise he has no lower extremity edema, orthopnea or PND.   Past Medical History:  Diagnosis Date   Anemia due to chronic kidney disease    Bilateral inguinal hernia    CAD (coronary artery disease) cardiologist-  dr bensimhon   PTCA of OM in 1998, [place] in 2000, Cath 9/07 LM ok LAD ok. LCX 95% in OM prior to previous stent RCA. nondominant normal EF normal. [place] DES to OM 2007 (PLACED PROXIAMAL TO PREVIOUS STENT)   Chronic kidney disease, stage III (moderate) [facility])    nephrologist-  dr detarding   Depression    Diabetes mellitus type 2, diet-controlled (  [place])    followed by pcp,  last A1c 5.2 on 09-10-2017 in epic   Gout, unspecified    10-29-2017  per pt stable   HLD (hyperlipidemia)    HTN (hypertension)    MGUS (monoclonal gammopathy of unknown significance) previously followed by dr Beryle Beams , [facility] and released 08/01/2011   IgG kappa dx 2002 8% plasma cells in bone marrow; no lesions on bone X-rays;   Nocturia    OA (osteoarthritis)    OSA on CPAP    per study 01-30-2004  severe osa , AHI 51.6/hr   PAD (peripheral artery disease) ([place])    05-21-2006  left renal artery stenosis, s/p balloon angioplasty and stenting;  last duplex 02/ 2012  normal  , arteries patent   S/P coronary artery stent placement    2000--  BMS x1  to OM;   2007-- DES x1  to OM proximal to previous stent   Secondary hyperparathyroidism of renal origin [facility])    Urgency of urination    Wears glasses     Past Surgical History:  Procedure Laterality Date   CARDIOVASCULAR STRESS TEST  2010   per dr bensimhon epic note dated 04-18-2016  normal   CORONARY ANGIOPLASTY  1998   PTCA to OM   CORONARY ANGIOPLASTY WITH STENT PLACEMENT  2000   PCI and BMS x1 OM   CORONARY ANGIOPLASTY WITH STENT PLACEMENT  12-18-2005  dr Claiborne Billings   DES x1 to proximal to previously placed stent in OM   CORONARY BALLOON ANGIOPLASTY N/A 08/02/2020   Procedure: CORONARY BALLOON ANGIOPLASTY;  Surgeon: Troy Sine, MD;  Location: [place] [facility];  Service: Cardiovascular;  Laterality: N/A;   CORONARY STENT INTERVENTION N/A 08/02/2020   Procedure: CORONARY STENT INTERVENTION;  Surgeon: Troy Sine, MD;  Location: [place] [facility];  Service: Cardiovascular;  Laterality: N/A;   HERNIA REPAIR     INGUINAL HERNIA REPAIR Bilateral 10/30/2017   Procedure: LAPAROSCOPIC  BILATERAL INGUINAL HERNIA REPAIR  AND LEFT [place];  Surgeon: Michael Boston, MD;  Location: [place];  Service: General;  Laterality: Bilateral;   INSERTION OF MESH Bilateral 10/30/2017   Procedure: INSERTION OF MESH;  Surgeon: Michael Boston, MD;  Location: [place];  Service: General;  Laterality: Bilateral;   LEFT HEART CATH AND CORONARY ANGIOGRAPHY N/A 08/01/2020   Procedure: LEFT HEART CATH AND CORONARY ANGIOGRAPHY;  Surgeon: Troy Sine, MD;  Location: [place] [facility];  Service: Cardiovascular;  Laterality: N/A;   RENAL ANGIOPLASTY Left 05-21-2006   dr berry   and stenting   TOTAL KNEE ARTHROPLASTY Left 07-22-2005   dr Shellia Carwin  [facility]   TRANSTHORACIC ECHOCARDIOGRAM  04/18/2016   ef 75-91%, grade 1 diastolic dysfunction/  mild to moderate AR/  mild MR    Current  Medications: Current Meds  Medication Sig   ACCU-CHEK SOFTCLIX LANCETS lancets Use to test blood sugar daily. Dx:E11.8   Alcohol Swabs (B-D SINGLE USE SWABS REGULAR) PADS Use with testing of blood sugar. Dx:E11.8   allopurinol (ZYLOPRIM) 100 MG tablet Take 1 tablet (100 mg total) by mouth daily.   amLODipine (NORVASC) 5 MG tablet Take 1 tablet (5 mg total) by mouth daily.   atorvastatin (LIPITOR) 80 MG tablet Take 1 tablet (80 mg total) by mouth daily.   citalopram (CELEXA) 20 MG tablet Take 1 tablet (20 mg total) by mouth daily.   clopidogrel (PLAVIX) 75 MG tablet Take 1 tablet (75 mg total) by  mouth daily with breakfast.   glucose blood (ACCU-CHEK AVIVA PLUS) test strip Use to check blood sugar daily. Dx:E11.8   lidocaine (LIDODERM) 5 % Place 1 patch onto the skin daily. Remove & Discard patch within 12 hours or as directed by MD   nitroGLYCERIN (NITROSTAT) 0.4 MG SL tablet DISSOLVE 1 TABLET UNDER THE TONGUE EVERY 5 MINUTES AS  NEEDED FOR CHEST PAIN. MAX  OF 3 TABLETS IN 15 MINUTES. CALL 911 IF PAIN PERSISTS.   ULTRA-THIN II MINI PEN NEEDLE 31G X 5 MM MISC    [DISCONTINUED] aspirin EC 81 MG tablet Take 1 tablet (81 mg total) by mouth daily.     Allergies:   Lisinopril, Losartan potassium, Crestor [rosuvastatin], and Tape   Social History   Socioeconomic History   Marital status: Widowed    Spouse name: Not on file   Number of children: 1   Years of education: Not on file   Highest education level: Not on file  Occupational History   Occupation: retired   Tobacco Use   Smoking status: Some Days    Packs/day: 0.25    Pack years: 0.00    Types: Cigarettes   Smokeless tobacco: Never   Tobacco comments:    10-29-2017  per pt was smoking 1pp2d, stopped June 2019  Vaping Use   Vaping Use: Never used  Substance and Sexual Activity   Alcohol use: Not Currently   Drug use: No   Sexual activity: Not on file  Other Topics Concern   Not on file  Social History Narrative   Widowed  02/2012   Lives along   Stopped smoking 2004   Alcohol none   Exercise works in [place] Strain: Not on file  Food Insecurity: Not on file  Transportation Needs: Not on file  Physical Activity: Not on file  Stress: Not on file  Social Connections: Not on file     Family History: The patient's family history includes Cancer in his sister and another family member; Coronary artery disease in an other family member; Diabetes in his brother, brother and another family member; Healthy in his father; Heart disease in his mother, sister, and sister; Hypertension in his sister.  ROS:   Please see the history of present illness.     All other systems reviewed and are negative.  EKGs/Labs/Other Studies Reviewed:    The following studies were reviewed today:  Echo 08/01/2020  1. Left ventricular ejection fraction, by estimation, is 45 to 50%. The  left ventricle has mildly decreased function. The left ventricle  demonstrates global hypokinesis. There is moderate concentric left  ventricular hypertrophy. Left ventricular  diastolic function could not be evaluated. There is hypokinesis of the  left ventricular, basal-mid anteroseptal wall.   2. Right ventricular systolic function is normal. The right ventricular  size is normal. There is normal pulmonary artery systolic pressure. The  estimated right ventricular systolic pressure is 60.7 mmHg.   3. The mitral valve is degenerative. Mild mitral valve regurgitation. No  evidence of mitral stenosis.   4. The aortic valve is tricuspid. There is moderate calcification of the  aortic valve. There is moderate thickening of the aortic valve. Aortic  valve regurgitation is mild. Mild to moderate aortic valve  sclerosis/calcification is present, without any  evidence of aortic stenosis. Aortic regurgitation PHT measures 573 msec.   5. The inferior vena cava is  dilated in  size with >50% respiratory  variability, suggesting right atrial pressure of 8 mmHg.   6. A small pericardial effusion is present. The pericardial effusion is  posterior to the left ventricle. There is no evidence of cardiac  tamponade.     Cath 08/01/2020 Prox RCA lesion is 85% stenosed. Mid RCA-1 lesion is 50% stenosed. Mid RCA-2 lesion is 85% stenosed. Dist RCA lesion is 90% stenosed. Mid LAD-1 lesion is 40% stenosed. Mid LAD-2 lesion is 80% stenosed. Mid Cx to Dist Cx lesion is 20% stenosed. LPAV lesion is 40% stenosed. 1st Sept lesion is 80% stenosed. Lat 2nd Mrg lesion is 90% stenosed. 2nd Mrg-1 lesion is 40% stenosed. 2nd Mrg-2 lesion is 80% stenosed.   Multivessel CAD with coronary calcification.  The LAD had 3% stenosis in the proximal to mid segment and a focal 80% mid stenosis; the large circumflex marginal vessel has smooth 40% proximal stenosis and there is focal 80% in-stent restenosis in the circumflex stent.  There is distal branch stenosis of 90% and a very small caliber branch of this marginal vessel.  The AV groove circumflex has mild 20 and 40% stenoses; the right coronary artery is a small caliber nondominant tortuous vessel that has focal segmental stenoses of 85 to 90% in the midsegment, 80 and 90% distally with small caliber distal vessel.   LVEDP 12 mmHg.   RECOMMENDATION: With the patient's age of 50 years, and stage IIIb CKD with creatinine clearance at 39, recommend hydration overnight.  We will resume heparin therapy 8 hours post sheath pull.  Angiograms were reviewed with Dr. Angelena Form.  Will initiate oral Plavix tomorrow morning with plans for two-vessel coronary intervention tomorrow following hydration involving the in-stent restenosis of the circumflex marginal vessel and mid LAD.  We will initially plan for medical therapy to the RCA which is diffusely diseased.     Cath 08/02/2020 Mid LAD-1 lesion is 40% stenosed. 1st Sept lesion is 80% stenosed. Mid  Cx to Dist Cx lesion is 20% stenosed. LPAV lesion is 40% stenosed. Lat 2nd Mrg lesion is 90% stenosed. 2nd Mrg-2 lesion is 80% stenosed. 2nd Mrg-1 lesion is 40% stenosed. Prox RCA lesion is 85% stenosed. Mid RCA-1 lesion is 50% stenosed. Mid RCA-2 lesion is 85% stenosed. Dist RCA lesion is 90% stenosed. Post intervention, there is a 45% residual stenosis. Post intervention, there is a 0% residual stenosis. Mid LAD-2 lesion is 80% stenosed. A stent was successfully placed.   Difficult but successful two-vessel coronary intervention with Cutting Balloon angioplasty of the 80% in-stent restenosis of the circumflex marginal vessel treated with a 2.5 x 10 mm Wolverine cutting balloon and dilatation with a 3.0 x 15 mm noncompliant balloon with the 80% in-stent stenosis being fibrotic and reduced to approximately 45%.      The 80% mid LAD stenosis underwent successful stenting with a 5 x 15 mm Resolute Onyx stent postdilated to 2.6 mm with the stenosis being reduced to 0%.   RECOMMENDATION: DAPT for minimum of 12 months.  Medical therapy for concomitant CAD and in particular the diffusely diseased RCA.  The patient will be hydrated post procedure.  Continue aggressive lipid-lowering therapy with target LDL in the 50s or below.  Optimal blood pressure management.  EKG:  EKG is not ordered today.   Recent Labs: 07/31/2020: ALT 22; TSH 2.483 08/07/2020: BUN 17; Creat 1.38; Hemoglobin 10.5; Platelets 465; Potassium 4.9; Sodium 137  Recent Lipid Panel    Component Value Date/Time   CHOL  106 08/01/2020 0320   CHOL 244 (H) 04/30/2015 1017   TRIG 50 08/01/2020 0320   HDL 39 (L) 08/01/2020 0320   HDL 55 04/30/2015 1017   CHOLHDL 2.7 08/01/2020 0320   VLDL 10 08/01/2020 0320   LDLCALC 57 08/01/2020 0320   LDLCALC 80 05/05/2019 1005     Risk Assessment/Calculations:    CHA2DS2-VASc Score = 5  This indicates a 7.2% annual risk of stroke. The patient's score is based upon: CHF History:  No HTN History: Yes Diabetes History: Yes Stroke History: No Vascular Disease History: Yes Age Score: 2 Gender Score: 0     Physical Exam:    VS:  BP 140/70   Pulse 82   Ht 5\' 8"  (1.727 m)   Wt 159 lb (72.1 kg)   SpO2 100%   BMI 24.18 kg/m     Wt Readings from Last 3 Encounters:  09/26/20 159 lb (72.1 kg)  08/24/20 160 lb (72.6 kg)  08/07/20 171 lb 9.6 oz (77.8 kg)     GEN:  Well nourished, well developed in no acute distress HEENT: Normal NECK: No JVD; No carotid bruits LYMPHATICS: No lymphadenopathy CARDIAC: RRR, no murmurs, rubs, gallops RESPIRATORY:  Clear to auscultation without rales, wheezing or rhonchi  ABDOMEN: Soft, non-tender, non-distended MUSCULOSKELETAL:  No edema; No deformity  SKIN: Warm and dry NEUROLOGIC:  Alert and oriented x 3 PSYCHIATRIC:  Normal affect   ASSESSMENT:    1. Holter monitor, abnormal   2. Coronary artery disease involving native coronary artery of native heart without angina pectoris   3. Essential hypertension, benign   4. Hyperlipidemia LDL goal <70   5. Controlled type 2 diabetes mellitus without complication, without long-term current use of insulin ([place])   6. Atrial flutter, unspecified type ([place])    PLAN:    In order of problems listed above:  Abnormal heart monitor: Recent heart monitor shows signs of atrial flutter with slow ventricular rate along with Mobitz 1 heart block and occasional junctional heartbeat.  Given clear conduction disease, I discussed the case with DOD, who recommended referral to EP service to follow-up.  CAD: Recent cardiac catheterization on 08/02/2020 with balloon angioplasty of left circumflex artery and the stent placement of LAD.  Continue Plavix.  Discussed with Dr. Claiborne Billings, will discontinue aspirin given the initiation of Eliquis  Hypertension: Blood pressure stable  Hyperlipidemia: On Lipitor  DM2: Managed by primary care provider  Atrial flutter: Significant atrial flutter burden noted  on recent heart monitor.  Started on Eliquis 5 mg twice a day.  Stop aspirin.     Medication Adjustments/Labs and Tests Ordered: Current medicines are reviewed at length with the patient today.  Concerns regarding medicines are outlined above.  Orders Placed This Encounter  Procedures   Ambulatory referral to Cardiac Electrophysiology   No orders of the defined types were placed in this encounter.   Patient Instructions  Medication Instructions:  STOP Aspirin  START Apixaban (Eliquis) 5 mg 2 times a day *If you need a refill on your cardiac medications before your next appointment, please call your pharmacy*  Lab Work: NONE ordered at this time of appointment   If you have labs (blood work) drawn today and your tests are completely normal, you will receive your results only by: [place] (if you have MyChart) OR A paper copy in the mail If you have any lab test that is abnormal or we need to change your treatment, we will call you to review the  results.   Testing/Procedures: You have been referred to Cardiac Electrophysiology   Follow-Up: At [facility], you and your health needs are our priority.  As part of our continuing mission to provide you with exceptional heart care, we have created designated Provider Care Teams.  These Care Teams include your primary Cardiologist (physician) and Advanced Practice Providers (APPs -  Physician Assistants and Nurse Practitioners) who all work together to provide you with the care you need, when you need it.  We recommend signing up for the patient portal called "MyChart".  Sign up information is provided on this After Visit Summary.  MyChart is used to connect with patients for Virtual Visits (Telemedicine).  Patients are able to view lab/test results, encounter notes, upcoming appointments, etc.  Non-urgent messages can be sent to your provider as well.   To learn more about what you can do with MyChart, go to  [email or web address].    Your next appointment:   As scheduled 10/29/20 @ 11:40 AM   The format for your next appointment:   In Person  Provider:   Glori Bickers, MD  Other Instructions    Signed, Almyra Deforest, [place]  09/28/2020 11:31 PM    [place]

## 2020-09-26 NOTE — Patient Instructions (Signed)
Medication Instructions:  STOP Aspirin  START Apixaban (Eliquis) 5 mg 2 times a day *If you need a refill on your cardiac medications before your next appointment, please call your pharmacy*  Lab Work: NONE ordered at this time of appointment   If you have labs (blood work) drawn today and your tests are completely normal, you will receive your results only by: [place] (if you have MyChart) OR A paper copy in the mail If you have any lab test that is abnormal or we need to change your treatment, we will call you to review the results.   Testing/Procedures: You have been referred to Cardiac Electrophysiology   Follow-Up: At [facility], you and your health needs are our priority.  As part of our continuing mission to provide you with exceptional heart care, we have created designated Provider Care Teams.  These Care Teams include your primary Cardiologist (physician) and Advanced Practice Providers (APPs -  Physician Assistants and Nurse Practitioners) who all work together to provide you with the care you need, when you need it.  We recommend signing up for the patient portal called "MyChart".  Sign up information is provided on this After Visit Summary.  MyChart is used to connect with patients for Virtual Visits (Telemedicine).  Patients are able to view lab/test results, encounter notes, upcoming appointments, etc.  Non-urgent messages can be sent to your provider as well.   To learn more about what you can do with MyChart, go to [email or web address].    Your next appointment:   As scheduled 10/29/20 @ 11:40 AM   The format for your next appointment:   In Person  Provider:   Glori Bickers, MD  Other Instructions

## 2020-09-28 ENCOUNTER — Encounter: Payer: Self-pay | Admitting: Physician Assistant

## 2020-10-22 ENCOUNTER — Other Ambulatory Visit: Payer: Self-pay | Admitting: *Deleted

## 2020-10-22 MED ORDER — CITALOPRAM HYDROBROMIDE 20 MG PO TABS: 20.0000 mg | ORAL_TABLET | Freq: Every day | ORAL | 3 refills | Status: DC

## 2020-10-22 MED ORDER — ALLOPURINOL 100 MG PO TABS: 100.0000 mg | ORAL_TABLET | Freq: Every day | ORAL | 3 refills | Status: DC

## 2020-10-22 MED ORDER — ATORVASTATIN CALCIUM 80 MG PO TABS: 80.0000 mg | ORAL_TABLET | Freq: Every day | ORAL | 3 refills | Status: DC

## 2020-10-22 NOTE — Telephone Encounter (Signed)
Pharmacy requested refill.  Pended Rx and sent to Dr. Miller for approval due to HIGH ALERT Warning.  

## 2020-10-22 NOTE — Telephone Encounter (Signed)
Pharmacy requested refill

## 2020-10-22 NOTE — Addendum Note (Signed)
Addended by: Rafael Bihari A on: 10/22/2020 02:34 PM   Modules accepted: Orders

## 2020-10-24 ENCOUNTER — Other Ambulatory Visit: Payer: Self-pay

## 2020-10-24 ENCOUNTER — Encounter: Payer: Self-pay | Admitting: Family Medicine

## 2020-10-24 ENCOUNTER — Ambulatory Visit (INDEPENDENT_AMBULATORY_CARE_PROVIDER_SITE_OTHER): Payer: Medicare Other | Admitting: Family Medicine

## 2020-10-24 VITALS — BP 122/84 | HR 93 | Temp 97.3°F | Ht 68.0 in | Wt 162.4 lb

## 2020-10-24 DIAGNOSIS — E782 Mixed hyperlipidemia: Secondary | ICD-10-CM | POA: Diagnosis not present

## 2020-10-24 DIAGNOSIS — E1169 Type 2 diabetes mellitus with other specified complication: Secondary | ICD-10-CM

## 2020-10-24 DIAGNOSIS — M10431 Other secondary gout, right wrist: Secondary | ICD-10-CM | POA: Diagnosis not present

## 2020-10-24 DIAGNOSIS — I10 Essential (primary) hypertension: Secondary | ICD-10-CM | POA: Diagnosis not present

## 2020-10-24 DIAGNOSIS — E785 Hyperlipidemia, unspecified: Secondary | ICD-10-CM | POA: Diagnosis not present

## 2020-10-24 DIAGNOSIS — I2510 Atherosclerotic heart disease of native coronary artery without angina pectoris: Secondary | ICD-10-CM

## 2020-10-24 MED ORDER — APIXABAN 5 MG PO TABS: 5.0000 mg | ORAL_TABLET | Freq: Two times a day (BID) | ORAL | 2 refills | Status: DC

## 2020-10-24 NOTE — Addendum Note (Signed)
Addended by: Jacqulynn Cadet on: 10/24/2020 12:25 PM   Modules accepted: Orders

## 2020-10-24 NOTE — Progress Notes (Signed)
Provider:  Alain Honey, MD  Careteam: Patient Care Team: [name], MD as PCP - General (Family Medicine) Bensimhon, Shaune Pascal, MD as PCP - Cardiology (Cardiology) Bensimhon, Shaune Pascal, MD as Consulting Physician (Cardiology) Penninger, Ria Comment, [place] as Physician Assistant (Nephrology) Donato Heinz, MD as Consulting Physician (Nephrology) Michael Boston, MD as Consulting Physician (General Surgery) Ralene Bathe, MD as Consulting Physician (Ophthalmology)  PLACE OF SERVICE:  [place]  Advanced Directive information    Allergies  Allergen Reactions   Lisinopril Swelling and Other (See Comments)    Angioedema    Losartan Potassium Swelling and Other (See Comments)    Angioedema    Crestor [Rosuvastatin] Swelling and Other (See Comments)    Angioedema    Tape Other (See Comments)    Irritates the skin    Chief Complaint  Patient presents with   Medical Management of Chronic Issues    Patient presents today for 4 month follow-up for HTN,DM and HLD.     HPI: Patient is a 87 y.o. male patient had stent blockage with cath and angioplasty back in April.  Since then he is doing well.  He lives alone does his own cooking.  When he walks with a cane.  No issues with memory.  Appetite is good weight is stable.  There is some suggestion of diabetes in the notes but most recent A1c was 5.3 and he is on no medication so that does not seem to be a problem. We reviewed his medicines today.  He does currently take Celexa and feels like he needs that.  Review of Systems:  Review of Systems  Constitutional: Negative.   Respiratory: Negative.    Cardiovascular: Negative.   Musculoskeletal:  Positive for joint pain.       Left knee pain.  Status post total knee replacement some years ago  Neurological: Negative.   Psychiatric/Behavioral: Negative.    All other systems reviewed and are negative.  Past Medical History:  Diagnosis Date   Anemia due to chronic  kidney disease    Bilateral inguinal hernia    CAD (coronary artery disease) cardiologist-  dr bensimhon   PTCA of OM in 1998, [place] in 2000, Cath 9/07 LM ok LAD ok. LCX 95% in OM prior to previous stent RCA. nondominant normal EF normal. Cypher DES to OM 2007 (PLACED PROXIAMAL TO PREVIOUS STENT)   Chronic kidney disease, stage III (moderate) ([place])    nephrologist-  dr detarding   Depression    Diabetes mellitus type 2, diet-controlled ([place])    followed by pcp,  last A1c 5.2 on 09-10-2017 in epic   Gout, unspecified    10-29-2017  per pt stable   HLD (hyperlipidemia)    HTN (hypertension)    MGUS (monoclonal gammopathy of unknown significance) previously followed by dr Beryle Beams , [facility] and released 08/01/2011   IgG kappa dx 2002 8% plasma cells in bone marrow; no lesions on bone X-rays;   Nocturia    OA (osteoarthritis)    OSA on CPAP    per study 01-30-2004  severe osa , AHI 51.6/hr   PAD (peripheral artery disease) ([place])    05-21-2006  left renal artery stenosis, s/p balloon angioplasty and stenting;  last duplex 02/ 2012  normal , arteries patent   S/P coronary artery stent placement    2000--  BMS x1  to OM;   2007-- DES x1  to OM proximal to previous stent   Secondary hyperparathyroidism of renal  origin [facility])    Urgency of urination    Wears glasses    Past Surgical History:  Procedure Laterality Date   CARDIOVASCULAR STRESS TEST  2010   per dr bensimhon epic note dated 04-18-2016  normal   CORONARY ANGIOPLASTY  1998   PTCA to [place]   PCI and BMS x1 OM   CORONARY ANGIOPLASTY WITH STENT PLACEMENT  12-18-2005  dr Claiborne Billings   DES x1 to proximal to previously placed stent in OM   CORONARY BALLOON ANGIOPLASTY N/A 08/02/2020   Procedure: CORONARY BALLOON ANGIOPLASTY;  Surgeon: Troy Sine, MD;  Location: [place] [facility];  Service: Cardiovascular;  Laterality: N/A;   CORONARY STENT INTERVENTION N/A 08/02/2020   Procedure: CORONARY  STENT INTERVENTION;  Surgeon: Troy Sine, MD;  Location: [place] [facility];  Service: Cardiovascular;  Laterality: N/A;   HERNIA REPAIR     INGUINAL HERNIA REPAIR Bilateral 10/30/2017   Procedure: LAPAROSCOPIC  BILATERAL INGUINAL HERNIA REPAIR  AND LEFT [place];  Surgeon: Michael Boston, MD;  Location: [place];  Service: General;  Laterality: Bilateral;   INSERTION OF MESH Bilateral 10/30/2017   Procedure: INSERTION OF MESH;  Surgeon: Michael Boston, MD;  Location: [place];  Service: General;  Laterality: Bilateral;   LEFT HEART CATH AND CORONARY ANGIOGRAPHY N/A 08/01/2020   Procedure: LEFT HEART CATH AND CORONARY ANGIOGRAPHY;  Surgeon: Troy Sine, MD;  Location: [place] [facility];  Service: Cardiovascular;  Laterality: N/A;   RENAL ANGIOPLASTY Left 05-21-2006   dr berry   and stenting   TOTAL KNEE ARTHROPLASTY Left 07-22-2005   dr Shellia Carwin  [facility]   TRANSTHORACIC ECHOCARDIOGRAM  04/18/2016   ef 38-75%, grade 1 diastolic dysfunction/  mild to moderate AR/  mild MR   Social History:   reports that he has been smoking cigarettes. He has been smoking an average of 0.25 packs per day. He has never used smokeless tobacco. He reports previous alcohol use. He reports that he does not use drugs.  Family History  Problem Relation Age of Onset   Heart disease Mother    Healthy Father    Heart disease Sister    Heart disease Sister    Cancer Sister        type unknown   Hypertension Sister    Diabetes Brother    Diabetes Brother    Diabetes Other    Coronary artery disease Other    Cancer Other     Medications: Patient's Medications  New Prescriptions   No medications on file  Previous Medications   ACCU-CHEK SOFTCLIX LANCETS LANCETS    Use to test blood sugar daily. Dx:E11.8   ALCOHOL SWABS (B-D SINGLE USE SWABS REGULAR) PADS    Use with testing of blood sugar. Dx:E11.8   ALLOPURINOL (ZYLOPRIM) 100 MG TABLET    Take 1 tablet (100  mg total) by mouth daily.   AMLODIPINE (NORVASC) 5 MG TABLET    Take 1 tablet (5 mg total) by mouth daily.   ATORVASTATIN (LIPITOR) 80 MG TABLET    Take 1 tablet (80 mg total) by mouth daily.   CITALOPRAM (CELEXA) 20 MG TABLET    Take 1 tablet (20 mg total) by mouth daily.   CLOPIDOGREL (PLAVIX) 75 MG TABLET    Take 1 tablet (75 mg total) by mouth daily with breakfast.   GLUCOSE BLOOD (ACCU-CHEK AVIVA PLUS) TEST STRIP  Use to check blood sugar daily. Dx:E11.8   LIDOCAINE (LIDODERM) 5 %    Place 1 patch onto the skin daily. Remove & Discard patch within 12 hours or as directed by MD   NITROGLYCERIN (NITROSTAT) 0.4 MG SL TABLET    DISSOLVE 1 TABLET UNDER THE TONGUE EVERY 5 MINUTES AS  NEEDED FOR CHEST PAIN. MAX  OF 3 TABLETS IN 15 MINUTES. CALL 911 IF PAIN PERSISTS.   ULTRA-THIN II MINI PEN NEEDLE 31G X 5 MM MISC      Modified Medications   No medications on file  Discontinued Medications   No medications on file    Physical Exam:  Vitals:   10/24/20 0943  BP: 122/84  Pulse: 93  Temp: (!) 97.3 F (36.3 C)  TempSrc: Temporal  SpO2: 98%  Weight: 162 lb 6.4 oz (73.7 kg)  Height: 5\' 8"  (1.727 m)   Body mass index is 24.69 kg/m. Wt Readings from Last 3 Encounters:  10/24/20 162 lb 6.4 oz (73.7 kg)  09/26/20 159 lb (72.1 kg)  08/24/20 160 lb (72.6 kg)    Physical Exam Vitals and nursing note reviewed.  Constitutional:      Appearance: Normal appearance.  Cardiovascular:     Rate and Rhythm: Normal rate. Rhythm irregular.  Musculoskeletal:        General: Normal range of motion.     Comments: Walks with a cane due to left knee pain  Neurological:     General: No focal deficit present.     Mental Status: He is alert and oriented to person, place, and time.  Psychiatric:        Mood and Affect: Mood normal.        Behavior: Behavior normal.        Thought Content: Thought content normal.        Judgment: Judgment normal.    Labs reviewed: Basic Metabolic Panel: Recent  Labs    07/31/20 2227 08/02/20 0638 08/03/20 0148 08/07/20 0000  NA  --  138 136 137  K  --  3.7 4.4 4.9  CL  --  110 107 107  CO2  --  21* 17* 17*  GLUCOSE  --  87 84 80  BUN  --  14 11 17   CREATININE  --  1.29* 1.18 1.38*  CALCIUM  --  8.2* 8.4* 9.1  TSH 2.483  --   --   --    Liver Function Tests: Recent Labs    07/27/20 1215 07/31/20 1540  AST 31 40  ALT 26 22  ALKPHOS 75 79  BILITOT 1.9* 1.1  PROT 7.7 9.5*  ALBUMIN 3.8 4.4   No results for input(s): LIPASE, AMYLASE in the last 8760 hours. No results for input(s): AMMONIA in the last 8760 hours. CBC: Recent Labs    07/27/20 1215 07/31/20 1540 08/01/20 0320 08/02/20 0638 08/03/20 0148 08/07/20 0000  WBC 13.5* 13.3*   < > 7.7 11.4* 8.2  NEUTROABS 12.0* 11.1*  --   --   --  5,838  HGB 13.0 13.8   < > 10.0* 10.9* 10.5*  HCT 39.8 42.3   < > 30.2* 33.3* 33.0*  MCV 94.8 94.6   < > 93.2 93.3 94.6  PLT 339 421*   < > 418* 501* 465*   < > = values in this interval not displayed.   Lipid Panel: Recent Labs    08/01/20 0320  CHOL 106  HDL 39*  LDLCALC 57  TRIG 50  CHOLHDL 2.7   TSH: Recent Labs    07/31/20 2227  TSH 2.483   A1C: Lab Results  Component Value Date   HGBA1C 5.3 06/13/2020     Assessment/Plan   1. Mixed hyperlipidemia due to type 2 diabetes mellitus ([place]) He takes 80 mg of atorvastatin daily.  Most recent LDL is at target, below 70  2. Essential hypertension Only medication is amlodipine 5 mg blood pressure is well controlled at 122/84 today  3. Atherosclerosis of native coronary artery of native heart without angina pectoris No recurring chest pain after angioplasty.  He is on Plavix as well  4. Other secondary acute gout of right wrist No recent flares.  Continues with allopurinol  Alain Honey, MD [place] [phone]

## 2020-10-24 NOTE — Patient Instructions (Signed)
Continue present meds

## 2020-10-25 LAB — HEPATIC FUNCTION PANEL
ALT: 12 IU/L (ref 0–44)
AST: 22 IU/L (ref 0–40)
Albumin: 4.5 g/dL (ref 3.6–4.6)
Alkaline Phosphatase: 104 IU/L (ref 44–121)
Bilirubin Total: 0.4 mg/dL (ref 0.0–1.2)
Bilirubin, Direct: 0.15 mg/dL (ref 0.00–0.40)
Total Protein: 8.2 g/dL (ref 6.0–8.5)

## 2020-10-25 LAB — LIPID PANEL
Chol/HDL Ratio: 2.5 ratio (ref 0.0–5.0)
Cholesterol, Total: 165 mg/dL (ref 100–199)
HDL: 66 mg/dL (ref >39)
LDL Chol Calc (NIH): 85 mg/dL (ref 0–99)
Triglycerides: 76 mg/dL (ref 0–149)
VLDL Cholesterol Cal: 14 mg/dL (ref 5–40)

## 2020-10-26 ENCOUNTER — Other Ambulatory Visit: Payer: Self-pay

## 2020-10-26 DIAGNOSIS — E785 Hyperlipidemia, unspecified: Secondary | ICD-10-CM

## 2020-10-26 MED ORDER — EZETIMIBE 10 MG PO TABS: 10.0000 mg | ORAL_TABLET | Freq: Every day | ORAL | 3 refills | Status: DC

## 2020-10-29 ENCOUNTER — Ambulatory Visit (HOSPITAL_COMMUNITY)
Admission: RE | Admit: 2020-10-29 | Discharge: 2020-10-29 | Disposition: A | Discharge status: Home / Self Care | Payer: Medicare Other | Source: Ambulatory Visit | Attending: Internal Medicine | Admitting: Internal Medicine

## 2020-10-29 ENCOUNTER — Telehealth (HOSPITAL_COMMUNITY): Payer: Self-pay | Admitting: Pharmacy Technician

## 2020-10-29 ENCOUNTER — Other Ambulatory Visit (HOSPITAL_COMMUNITY): Payer: Self-pay

## 2020-10-29 ENCOUNTER — Other Ambulatory Visit: Payer: Self-pay

## 2020-10-29 VITALS — BP 128/70 | HR 67 | Wt 164.4 lb

## 2020-10-29 DIAGNOSIS — E1151 Type 2 diabetes mellitus with diabetic peripheral angiopathy without gangrene: Secondary | ICD-10-CM | POA: Insufficient documentation

## 2020-10-29 DIAGNOSIS — Z7901 Long term (current) use of anticoagulants: Secondary | ICD-10-CM | POA: Insufficient documentation

## 2020-10-29 DIAGNOSIS — E785 Hyperlipidemia, unspecified: Secondary | ICD-10-CM | POA: Insufficient documentation

## 2020-10-29 DIAGNOSIS — Z955 Presence of coronary angioplasty implant and graft: Secondary | ICD-10-CM | POA: Insufficient documentation

## 2020-10-29 DIAGNOSIS — Z8249 Family history of ischemic heart disease and other diseases of the circulatory system: Secondary | ICD-10-CM | POA: Insufficient documentation

## 2020-10-29 DIAGNOSIS — R011 Cardiac murmur, unspecified: Secondary | ICD-10-CM

## 2020-10-29 DIAGNOSIS — Z79899 Other long term (current) drug therapy: Secondary | ICD-10-CM | POA: Insufficient documentation

## 2020-10-29 DIAGNOSIS — N183 Chronic kidney disease, stage 3 unspecified: Secondary | ICD-10-CM

## 2020-10-29 DIAGNOSIS — I255 Ischemic cardiomyopathy: Secondary | ICD-10-CM | POA: Diagnosis not present

## 2020-10-29 DIAGNOSIS — Z87891 Personal history of nicotine dependence: Secondary | ICD-10-CM | POA: Diagnosis not present

## 2020-10-29 DIAGNOSIS — N1831 Chronic kidney disease, stage 3a: Secondary | ICD-10-CM | POA: Diagnosis not present

## 2020-10-29 DIAGNOSIS — I252 Old myocardial infarction: Secondary | ICD-10-CM | POA: Insufficient documentation

## 2020-10-29 DIAGNOSIS — I2510 Atherosclerotic heart disease of native coronary artery without angina pectoris: Secondary | ICD-10-CM | POA: Insufficient documentation

## 2020-10-29 DIAGNOSIS — E1122 Type 2 diabetes mellitus with diabetic chronic kidney disease: Secondary | ICD-10-CM | POA: Insufficient documentation

## 2020-10-29 DIAGNOSIS — I129 Hypertensive chronic kidney disease with stage 1 through stage 4 chronic kidney disease, or unspecified chronic kidney disease: Secondary | ICD-10-CM | POA: Diagnosis not present

## 2020-10-29 DIAGNOSIS — I080 Rheumatic disorders of both mitral and aortic valves: Secondary | ICD-10-CM | POA: Insufficient documentation

## 2020-10-29 DIAGNOSIS — Z7902 Long term (current) use of antithrombotics/antiplatelets: Secondary | ICD-10-CM | POA: Insufficient documentation

## 2020-10-29 DIAGNOSIS — I10 Essential (primary) hypertension: Secondary | ICD-10-CM | POA: Diagnosis not present

## 2020-10-29 DIAGNOSIS — I4892 Unspecified atrial flutter: Secondary | ICD-10-CM | POA: Diagnosis not present

## 2020-10-29 DIAGNOSIS — R040 Epistaxis: Secondary | ICD-10-CM | POA: Diagnosis not present

## 2020-10-29 DIAGNOSIS — I441 Atrioventricular block, second degree: Secondary | ICD-10-CM | POA: Diagnosis not present

## 2020-10-29 LAB — COMPREHENSIVE METABOLIC PANEL
ALT: 14 U/L (ref 0–44)
AST: 21 U/L (ref 15–41)
Albumin: 4 g/dL (ref 3.5–5.0)
Alkaline Phosphatase: 78 U/L (ref 38–126)
Anion gap: 10 (ref 5–15)
BUN: 14 mg/dL (ref 8–23)
CO2: 25 mmol/L (ref 22–32)
Calcium: 9.3 mg/dL (ref 8.9–10.3)
Chloride: 104 mmol/L (ref 98–111)
Creatinine, Ser: 1.37 mg/dL — ABNORMAL HIGH (ref 0.61–1.24)
GFR, Estimated: 50 mL/min — ABNORMAL LOW (ref >60)
Glucose, Bld: 68 mg/dL — ABNORMAL LOW (ref 70–99)
Potassium: 5 mmol/L (ref 3.5–5.1)
Sodium: 139 mmol/L (ref 135–145)
Total Bilirubin: 0.6 mg/dL (ref 0.3–1.2)
Total Protein: 8.3 g/dL — ABNORMAL HIGH (ref 6.5–8.1)

## 2020-10-29 LAB — CBC
HCT: 36.7 % — ABNORMAL LOW (ref 39.0–52.0)
Hemoglobin: 11.5 g/dL — ABNORMAL LOW (ref 13.0–17.0)
MCH: 29.9 pg (ref 26.0–34.0)
MCHC: 31.3 g/dL (ref 30.0–36.0)
MCV: 95.6 fL (ref 80.0–100.0)
Platelets: 419 10*3/uL — ABNORMAL HIGH (ref 150–400)
RBC: 3.84 MIL/uL — ABNORMAL LOW (ref 4.22–5.81)
RDW: 17.8 % — ABNORMAL HIGH (ref 11.5–15.5)
WBC: 7.4 10*3/uL (ref 4.0–10.5)
nRBC: 0 % (ref 0.0–0.2)

## 2020-10-29 LAB — IRON AND TIBC
Iron: 50 ug/dL (ref 45–182)
Saturation Ratios: 18 % (ref 17.9–39.5)
TIBC: 277 ug/dL (ref 250–450)
UIBC: 227 ug/dL

## 2020-10-29 LAB — FERRITIN: Ferritin: 215 ng/mL (ref 24–336)

## 2020-10-29 LAB — BRAIN NATRIURETIC PEPTIDE: B Natriuretic Peptide: 233.9 pg/mL — ABNORMAL HIGH (ref 0.0–100.0)

## 2020-10-29 MED ORDER — DAPAGLIFLOZIN PROPANEDIOL 5 MG PO TABS: 5.0000 mg | ORAL_TABLET | Freq: Every day | ORAL | 3 refills | Status: DC

## 2020-10-29 NOTE — Patient Instructions (Addendum)
EKG done today.  Labs done today. We will contact you only if your labs are abnormal.  START Farxiga 5mg  (1 tablet) by mouth daily.  No other medication changes were made. Please continue all current medications as prescribed.  You have been referred to ENT. They will contact you to schedule an appointment.  You have been referred to [facility]. They will contact you to schedule an appointment.   Your physician recommends that you schedule a follow-up appointment in: 6 months. Please contact our office in December to schedule a January appointment.   If you have any questions or concerns before your next appointment please send [place] a message through [place] or call our office at [phone].    TO LEAVE A MESSAGE FOR THE NURSE SELECT OPTION 2, PLEASE LEAVE A MESSAGE INCLUDING: YOUR NAME DATE OF BIRTH CALL BACK NUMBER REASON FOR CALL**this is important as we prioritize the call backs  YOU WILL RECEIVE A CALL BACK THE SAME DAY AS LONG AS YOU CALL BEFORE 4:00 PM   Do the following things EVERYDAY: Weigh yourself in the morning before breakfast. Write it down and keep it in a log. Take your medicines as prescribed Eat low salt foods--Limit salt (sodium) to 2000 mg per day.  Stay as active as you can everyday Limit all fluids for the day to less than 2 liters   At the [facility], you and your health needs are our priority. As part of our continuing mission to provide you with exceptional heart care, we have created designated Provider Care Teams. These Care Teams include your primary Cardiologist (physician) and Advanced Practice Providers (APPs- Physician Assistants and Nurse Practitioners) who all work together to provide you with the care you need, when you need it.   You may see any of the following providers on your designated Care Team at your next follow up: Dr Glori Bickers Dr Haynes Kerns, NP Lyda Jester, [place] Audry Riles,  PharmD   Please be sure to bring in all your medications bottles to every appointment.

## 2020-10-29 NOTE — Telephone Encounter (Signed)
Advanced Heart Failure Patient Advocate Encounter  Patient was seen in clinic and started on Farxiga. Current 90 day supply, $236. Was able to obtain a PAN HF grant to cover the cost of Farxiga. Printed grant information for patient to take to the pharmacy.  Member ID: [id number] Group ID: [id number] RxBin ID: [id number] PCN: [id number] Eligibility Start Date: 07/31/2020 Eligibility End Date: 10/28/2021 Assistance Amount: $1,000.00  Kevin Schwartz

## 2020-10-29 NOTE — Progress Notes (Signed)
AHF CLINIC NOTE  Patient ID: Kevin Schwartz, male   DOB: 06-Oct-1930, 87 y.o.   MRN: [id number] PCP: Dr. Hollace Kinnier  at [facility]  HPI: Mr. Belger is an 87 y/o male with  DM2, HTN, HL, mild renal insufficiency, RAS s/p stenting of L renal artery in 2008, CAD s/p multiple PCIs referred by Almyra Deforest PA-C for further f/u of his CAD  Admitted 07/31/2020 with left shoulder pain.  EKG showed no obvious ST-T wave changes however does reveal Mobitz 1 heart block, beta-blocker was stopped.  He was ruled in for NSTEMI.  Echocardiogram /20/2022 showed EF 45 to 50%, hypokinesis of the LV basal mid anteroseptal wall, RVSP 28.2 mmHg, mild MR, mild AI, small pericardial effusion.  Cardiac catheterization 08/01/2020 with multivessel CAD with 85% proximal RCA, 85% mid RCA, 90% distal RCA, 80% mid LAD, 80% OM 2 lesion.  Patient was placed on aspirin and Plavix.  Patient returned to the Cath Lab on 08/02/2020 for staged PCI with DES to the mid LAD, balloon angioplasty of left circumflex lesion, the diffusely diseased RCA was treated medically.  Put on a heart monitor for 14 days on discharge with results:  1.  Predominant underlying rhythm was sinus rhythm with 1AVBP -  avg HR of 75 bpm. 2.  Atrial Flutter occurred (39% burden), ranging from 35-143 bpm (avg of 68 bpm), the longest lasting 5 days 10 hours with an avg rate of 68 bpm. 3. Five episodes of NSVT - longest was 10 beats 4. Transient second Degree AV  Block-Mobitz I ([place]enckebach) was present. 5. Transient junctional rhythm was present with rates in mid 40s 6. Rare PACs and PVCs  Last visit with AHF clinic 2018 stable class II symptoms at that time  Here for FU visit to reestablish care.  He reports he has been feeling well, denies shortness of breath, PND, orthopnea.  Ambulating with a cane.  He denies shortness of breath.  Can walk about 1-2 city blocks before having to take a break.  Denies any chest pain. Having some nose bleeds  with increased frequency.  Happening about every other week for varying amounts of time.  No light headedness or dizziness, not noticing any fatigue.  BP at home runs about 263 systolic on average.     Review of Systems: [y] = yes, [ ]  = no      General: Weight gain [ ] ; Weight loss [ ] ; Anorexia [ ] ; Fatigue [ ] ; Fever [ ] ; Chills [ ] ; Weakness Blue.Reese ]   Cardiac: Chest pain/pressure [ ] ; Resting SOB [ y]; Exertional SOB [y]; Orthopnea [ ] ; Pedal Edema [] ; Palpitations [ ] ; Syncope [ ] ; Presyncope [ ] ; Paroxysmal nocturnal dyspnea[ ]    Pulmonary: Cough [ ] ; Wheezing[ ] ; Hemoptysis[ ] ; Sputum [ ] ; Snoring [ ]    GI: Vomiting[ ] ; Dysphagia[ ] ; Melena[ ] ; Hematochezia [ ] ; Heartburn[ ] ; Abdominal pain [ ] ; Constipation [ ] ; Diarrhea [ ] ; BRBPR [ ]    GU: Hematuria[ ] ; Dysuria [ ] ; Nocturia[ ]  Vascular: Pain in legs with walking [ ] ; Pain in feet with lying flat [ ] ; Non-healing sores [ ] ; Stroke [ ] ; TIA [ ] ; Slurred speech [ ] ;   Neuro: Headaches[ ] ; Vertigo[ ] ; Seizures[ ] ; Paresthesias[ ] ;Blurred vision [ ] ; Diplopia [ ] ; Vision changes [ ]    Ortho/Skin: Arthritis [y]; Joint pain [y]; Muscle pain [ ] ; Joint swelling [ ] ; Back Pain [ ] ; Rash [ ]    Psych: Depression[ ] ;  Anxiety[ ]    Heme: Bleeding problems [ ] ; Clotting disorders [ ] ; Anemia [ ]    Endocrine: Diabetes [ ] ; Thyroid dysfunction[ ]    Past Medical History:  Diagnosis Date   Anemia due to chronic kidney disease    Bilateral inguinal hernia    CAD (coronary artery disease) cardiologist-  dr Shekera Beavers   PTCA of OM in 1998, [place] OM in 2000, Cath 9/07 LM ok LAD ok. LCX 95% in OM prior to previous stent RCA. nondominant normal EF normal. Cypher DES to OM 2007 (PLACED PROXIAMAL TO PREVIOUS STENT)   Chronic kidney disease, stage III (moderate) ([place])    nephrologist-  dr detarding   Depression    Diabetes mellitus type 2, diet-controlled ([place])    followed by pcp,  last A1c 5.2 on 09-10-2017 in epic   Gout, unspecified    10-29-2017  per pt  stable   HLD (hyperlipidemia)    HTN (hypertension)    MGUS (monoclonal gammopathy of unknown significance) previously followed by dr Beryle Beams , [place] Clement and released 08/01/2011   IgG kappa dx 2002 8% plasma cells in bone marrow; no lesions on bone X-rays;   Nocturia    OA (osteoarthritis)    OSA on CPAP    per study 01-30-2004  severe osa , AHI 51.6/hr   PAD (peripheral artery disease) ([place])    05-21-2006  left renal artery stenosis, s/p balloon angioplasty and stenting;  last duplex 02/ 2012  normal , arteries patent   S/P coronary artery stent placement    2000--  BMS x1  to OM;   2007-- DES x1  to OM proximal to previous stent   Secondary hyperparathyroidism of renal origin ([place])    Urgency of urination    Wears glasses     Current Outpatient Medications  Medication Sig Dispense Refill   ACCU-CHEK SOFTCLIX LANCETS lancets Use to test blood sugar daily. Dx:E11.8 300 each 3   Alcohol Swabs (B-D SINGLE USE SWABS REGULAR) PADS Use with testing of blood sugar. Dx:E11.8 300 each 3   allopurinol (ZYLOPRIM) 100 MG tablet Take 1 tablet (100 mg total) by mouth daily. 90 tablet 3   amLODipine (NORVASC) 5 MG tablet Take 1 tablet (5 mg total) by mouth daily. 90 tablet 3   apixaban (ELIQUIS) 5 MG TABS tablet Take 1 tablet (5 mg total) by mouth 2 (two) times daily. 60 tablet 2   atorvastatin (LIPITOR) 80 MG tablet Take 1 tablet (80 mg total) by mouth daily. 90 tablet 3   citalopram (CELEXA) 20 MG tablet Take 1 tablet (20 mg total) by mouth daily. 90 tablet 3   clopidogrel (PLAVIX) 75 MG tablet Take 1 tablet (75 mg total) by mouth daily with breakfast. 360 tablet 0   dapagliflozin propanediol (FARXIGA) 5 MG TABS tablet Take 1 tablet (5 mg total) by mouth daily before breakfast. 90 tablet 3   ezetimibe (ZETIA) 10 MG tablet Take 1 tablet (10 mg total) by mouth daily. 90 tablet 3   glucose blood (ACCU-CHEK AVIVA PLUS) test strip Use to check blood sugar daily. Dx:E11.8 300 each 3   lidocaine (LIDODERM)  5 % Place 1 patch onto the skin daily. Remove & Discard patch within 12 hours or as directed by MD 5 patch 0   nitroGLYCERIN (NITROSTAT) 0.4 MG SL tablet DISSOLVE 1 TABLET UNDER THE TONGUE EVERY 5 MINUTES AS  NEEDED FOR CHEST PAIN. MAX  OF 3 TABLETS IN 15 MINUTES. CALL 911 IF PAIN PERSISTS. 75 tablet 1  ULTRA-THIN II MINI PEN NEEDLE 31G X 5 MM MISC      No current facility-administered medications for this encounter.   Social History   Tobacco Use   Smoking status: Some Days    Packs/day: 0.25    Types: Cigarettes   Smokeless tobacco: Never   Tobacco comments:    10-29-2017  per pt was smoking 1pp2d, stopped June 2019  Vaping Use   Vaping Use: Never used  Substance Use Topics   Alcohol use: Not Currently   Drug use: No   Family History  Problem Relation Age of Onset   Heart disease Mother    Healthy Father    Heart disease Sister    Heart disease Sister    Cancer Sister        type unknown   Hypertension Sister    Diabetes Brother    Diabetes Brother    Diabetes Other    Coronary artery disease Other    Cancer Other      PHYSICAL EXAM: Vitals:   10/29/20 1142  BP: 128/70  Pulse: 67  SpO2: 100%    General:   General:  Elderly. Well appearing. No resp difficulty HEENT: normal Neck: supple. no JVD. Carotids 2+ bilat; no bruits. No lymphadenopathy or thryomegaly appreciated. Cor: PMI nondisplaced. Regular rate & rhythm. No rubs, gallops or murmurs. Lungs: clear Abdomen: soft, nontender, nondistended. No hepatosplenomegaly. No bruits or masses. Good bowel sounds. Extremities: no cyanosis, clubbing, rash, edema Neuro: alert & orientedx3, cranial nerves grossly intact. moves all 4 extremities w/o difficulty. Affect pleasant   ECG: SR 65 1avb PR 378ms. LVH.  No acute ST-T abnormalities.    ASSESSMENT & PLAN:  1. CAD  - s/p NSTEMI 4/22.  -Cardiac catheterization 08/01/2020 with multivessel CAD with 85% proximal RCA, 85% mid RCA, 90% distal RCA, 80% mid LAD, 80%  OM 2 lesion.  Patient was placed on aspirin and Plavix.  Patient returned to the Cath Lab on 08/02/2020 for staged PCI with DES to the mid LAD, balloon angioplasty of left circumflex lesion, the diffusely diseased RCA was treated medically. - No s/s angina - Continue eliquis/plavix, statin - Start [place] 5  2. Ischemic CM - EF 45-50% on echo 4/22  - off b-blocker due to HB - no ace/arb/arni with hx angioedema on ace and arb - start farxiga 5. Discussed need to watch closely for orthostatic symptoms.   3. Paroxysmal Atrial Flutter -on eliquis and plavix, continue -39% burden by recent zio 09/2020  3. Mobitz 1 second degree HB in setting of NSTEMI 4/22 - b-blocker on hold. Marked 1AVB on ECG today, Continue to hold b-blocker - referred to EP 08/2020 - No current indication for PPM  3. HTN - Blood pressure well controlled. Continue current regimen.  4. Aortic valve insuffiency - Mild AI on echo 4/22 (stable from 2018)  5. Epistaxis -episodes of varying severity every other week -has been having for years but prior to eliquis/plavix were less frequent and severe -check iron studies -referral to ENT  6. CKD 3a -start farxiga as above -may be a good candidate for finerenone in the future  7.T2DM -well controlled -start farxiga as above   Quillian Quince Koji Niehoff,MD 11:59 PM

## 2020-11-08 ENCOUNTER — Other Ambulatory Visit: Payer: Self-pay | Admitting: *Deleted

## 2020-11-08 MED ORDER — NITROGLYCERIN 0.4 MG SL SUBL: SUBLINGUAL_TABLET | SUBLINGUAL | 1 refills | Status: DC

## 2020-11-08 NOTE — Telephone Encounter (Signed)
Pharmacy requested refill

## 2020-11-16 ENCOUNTER — Encounter: Payer: Self-pay | Admitting: Cardiology

## 2020-11-16 ENCOUNTER — Ambulatory Visit (INDEPENDENT_AMBULATORY_CARE_PROVIDER_SITE_OTHER): Payer: Medicare Other | Admitting: Cardiology

## 2020-11-16 ENCOUNTER — Other Ambulatory Visit: Payer: Self-pay

## 2020-11-16 VITALS — BP 140/64 | HR 79 | Ht 67.0 in | Wt 167.0 lb

## 2020-11-16 DIAGNOSIS — I10 Essential (primary) hypertension: Secondary | ICD-10-CM

## 2020-11-16 DIAGNOSIS — I498 Other specified cardiac arrhythmias: Secondary | ICD-10-CM

## 2020-11-16 DIAGNOSIS — I4892 Unspecified atrial flutter: Secondary | ICD-10-CM

## 2020-11-16 NOTE — Progress Notes (Signed)
Electrophysiology Office Note:    Date:  11/16/2020   ID:  Kevin Schwartz, DOB 08/01/1930, MRN [id number]  PCP:  [name], MD  [facility] Cardiologist:  Glori Bickers, MD  [place] Electrophysiologist:  None   Referring MD: Almyra Deforest, [place]   Chief Complaint: Abnormal Holter monitor  History of Present Illness:    Kevin Schwartz is a 87 y.o. male who presents for an evaluation of abnormal Holter monitor at the request of Almyra Deforest, [place]. Their medical history includes diabetes, hypertension, hyperlipidemia, coronary artery disease post multiple PCI's.  Kevin Schwartz was admitted to the hospital in April 2022 with chest pain.  He had a left heart cath which showed diffuse disease in 3 vessels which was treated with PCI.  Heart monitor was placed at discharge for 14 days which showed a 39% burden of atrial flutter.  The longest episode of atrial flutter lasted 5 days and 10 hours.  Today he tells me he cannot tell when he is in or out of rhythm.  No syncope or presyncope.  He is quite active without lightheadedness or dizziness.  Past Medical History:  Diagnosis Date   Anemia due to chronic kidney disease    Bilateral inguinal hernia    CAD (coronary artery disease) cardiologist-  dr bensimhon   PTCA of OM in 1998, [place] in 2000, Cath 9/07 LM ok LAD ok. LCX 95% in OM prior to previous stent RCA. nondominant normal EF normal. Cypher DES to OM 2007 (PLACED PROXIAMAL TO PREVIOUS STENT)   Chronic kidney disease, stage III (moderate) ([place])    nephrologist-  dr detarding   Depression    Diabetes mellitus type 2, diet-controlled ([place])    followed by pcp,  last A1c 5.2 on 09-10-2017 in epic   Gout, unspecified    10-29-2017  per pt stable   HLD (hyperlipidemia)    HTN (hypertension)    MGUS (monoclonal gammopathy of unknown significance) previously followed by dr Beryle Beams , [facility] and released 08/01/2011   IgG kappa dx 2002 8% plasma cells in bone marrow; no  lesions on bone X-rays;   Nocturia    OA (osteoarthritis)    OSA on CPAP    per study 01-30-2004  severe osa , AHI 51.6/hr   PAD (peripheral artery disease) ([place])    05-21-2006  left renal artery stenosis, s/p balloon angioplasty and stenting;  last duplex 02/ 2012  normal , arteries patent   S/P coronary artery stent placement    2000--  BMS x1  to OM;   2007-- DES x1  to OM proximal to previous stent   Secondary hyperparathyroidism of renal origin [facility])    Urgency of urination    Wears glasses     Past Surgical History:  Procedure Laterality Date   CARDIOVASCULAR STRESS TEST  2010   per dr bensimhon epic note dated 04-18-2016  normal   CORONARY ANGIOPLASTY  1998   PTCA to OM   CORONARY ANGIOPLASTY WITH STENT PLACEMENT  2000   PCI and BMS x1 OM   CORONARY ANGIOPLASTY WITH STENT PLACEMENT  12-18-2005  dr Claiborne Billings   DES x1 to proximal to previously placed stent in OM   CORONARY BALLOON ANGIOPLASTY N/A 08/02/2020   Procedure: CORONARY BALLOON ANGIOPLASTY;  Surgeon: Troy Sine, MD;  Location: [place] CV [facility];  Service: Cardiovascular;  Laterality: N/A;   CORONARY STENT INTERVENTION N/A 08/02/2020   Procedure: CORONARY STENT INTERVENTION;  Surgeon: Troy Sine,  MD;  Location: [place] [facility];  Service: Cardiovascular;  Laterality: N/A;   HERNIA REPAIR     INGUINAL HERNIA REPAIR Bilateral 10/30/2017   Procedure: LAPAROSCOPIC  BILATERAL INGUINAL HERNIA REPAIR  AND LEFT [place];  Surgeon: Michael Boston, MD;  Location: [place];  Service: General;  Laterality: Bilateral;   INSERTION OF MESH Bilateral 10/30/2017   Procedure: INSERTION OF MESH;  Surgeon: Michael Boston, MD;  Location: [place];  Service: General;  Laterality: Bilateral;   LEFT HEART CATH AND CORONARY ANGIOGRAPHY N/A 08/01/2020   Procedure: LEFT HEART CATH AND CORONARY ANGIOGRAPHY;  Surgeon: Troy Sine, MD;  Location: [place] [facility];  Service: Cardiovascular;   Laterality: N/A;   RENAL ANGIOPLASTY Left 05-21-2006   dr berry   and stenting   TOTAL KNEE ARTHROPLASTY Left 07-22-2005   dr Shellia Carwin  [facility]   TRANSTHORACIC ECHOCARDIOGRAM  04/18/2016   ef 58-52%, grade 1 diastolic dysfunction/  mild to moderate AR/  mild MR    Current Medications: Current Meds  Medication Sig   ACCU-CHEK SOFTCLIX LANCETS lancets Use to test blood sugar daily. Dx:E11.8   Alcohol Swabs (B-D SINGLE USE SWABS REGULAR) PADS Use with testing of blood sugar. Dx:E11.8   allopurinol (ZYLOPRIM) 100 MG tablet Take 1 tablet (100 mg total) by mouth daily.   amLODipine (NORVASC) 5 MG tablet Take 1 tablet (5 mg total) by mouth daily.   apixaban (ELIQUIS) 5 MG TABS tablet Take 1 tablet (5 mg total) by mouth 2 (two) times daily.   atorvastatin (LIPITOR) 80 MG tablet Take 1 tablet (80 mg total) by mouth daily.   citalopram (CELEXA) 20 MG tablet Take 1 tablet (20 mg total) by mouth daily.   clopidogrel (PLAVIX) 75 MG tablet Take 1 tablet (75 mg total) by mouth daily with breakfast.   dapagliflozin propanediol (FARXIGA) 5 MG TABS tablet Take 1 tablet (5 mg total) by mouth daily before breakfast.   ezetimibe (ZETIA) 10 MG tablet Take 1 tablet (10 mg total) by mouth daily.   glucose blood (ACCU-CHEK AVIVA PLUS) test strip Use to check blood sugar daily. Dx:E11.8   lidocaine (LIDODERM) 5 % Place 1 patch onto the skin daily. Remove & Discard patch within 12 hours or as directed by MD   nitroGLYCERIN (NITROSTAT) 0.4 MG SL tablet DISSOLVE 1 TABLET UNDER THE TONGUE EVERY 5 MINUTES AS  NEEDED FOR CHEST PAIN. MAX  OF 3 TABLETS IN 15 MINUTES. CALL 911 IF PAIN PERSISTS.   ULTRA-THIN II MINI PEN NEEDLE 31G X 5 MM MISC      Allergies:   Lisinopril, Losartan potassium, Crestor [rosuvastatin], and Tape   Social History   Socioeconomic History   Marital status: Widowed    Spouse name: Not on file   Number of children: 1   Years of education: Not on file   Highest education level: Not on file   Occupational History   Occupation: retired   Tobacco Use   Smoking status: Some Days    Packs/day: 0.25    Types: Cigarettes   Smokeless tobacco: Never   Tobacco comments:    10-29-2017  per pt was smoking 1pp2d, stopped June 2019  Vaping Use   Vaping Use: Never used  Substance and Sexual Activity   Alcohol use: Not Currently   Drug use: No   Sexual activity: Not on file  Other Topics Concern   Not on file  Social History Narrative   Widowed 02/2012  Lives along   Stopped smoking 2004   Alcohol none   Exercise works in [place] Strain: Not on file  Food Insecurity: Not on file  Transportation Needs: Not on file  Physical Activity: Not on file  Stress: Not on file  Social Connections: Not on file     Family History: The patient's family history includes Cancer in his sister and another family member; Coronary artery disease in an other family member; Diabetes in his brother, brother and another family member; Healthy in his father; Heart disease in his mother, sister, and sister; Hypertension in his sister.  ROS:   Please see the history of present illness.    All other systems reviewed and are negative.  EKGs/Labs/Other Studies Reviewed:    The following studies were reviewed today:  Prior notes  August 01, 2020 echo personally reviewed Left ventricular function mildly reduced, 45% Global hypokinesis Right ventricular function is normal Mild MR  October 07, 2020 ZIO personally reviewed 39% burden of atrial flutter, longest episode lasting 5 days and 10 hours Transient second-degree AV block [place] 1 Transient junctional rhythm      EKG:  The ekg ordered today demonstrates sinus rhythm.  First-degree AV delay  Recent Labs: 07/31/2020: TSH 2.483 10/29/2020: ALT 14; B Natriuretic Peptide 233.9; BUN 14; Creatinine, Ser 1.37; Hemoglobin 11.5; Platelets 419; Potassium 5.0; Sodium 139   Recent Lipid Panel    Component Value Date/Time   CHOL 165 10/24/2020 1214   TRIG 76 10/24/2020 1214   HDL 66 10/24/2020 1214   CHOLHDL 2.5 10/24/2020 1214   CHOLHDL 2.7 08/01/2020 0320   VLDL 10 08/01/2020 0320   LDLCALC 85 10/24/2020 1214   LDLCALC 80 05/05/2019 1005    Physical Exam:    VS:  BP 140/64   Pulse 79   Ht 5\' 7"  (1.702 m)   Wt 167 lb (75.8 kg)   BMI 26.16 kg/m     Wt Readings from Last 3 Encounters:  11/16/20 167 lb (75.8 kg)  10/29/20 164 lb 6.4 oz (74.6 kg)  10/24/20 162 lb 6.4 oz (73.7 kg)     GEN:  Well nourished, well developed in no acute distress HEENT: Normal NECK: No JVD; No carotid bruits LYMPHATICS: No lymphadenopathy CARDIAC: RRR, no murmurs, rubs, gallops RESPIRATORY:  Clear to auscultation without rales, wheezing or rhonchi  ABDOMEN: Soft, non-tender, non-distended MUSCULOSKELETAL:  No edema; No deformity  SKIN: Warm and dry NEUROLOGIC:  Alert and oriented x 3 PSYCHIATRIC:  Normal affect   ASSESSMENT:    1. Atrial flutter, unspecified type (HCC)   2. Junctional rhythm   3. Essential hypertension    PLAN:    In order of problems listed above:   1. Atrial flutter, unspecified type ([place]) On Eliquis for stroke prophylaxis.  Fairly large burden of a flutter but he is asymptomatic with it.  If he becomes symptomatic could consider initiation of an antiarrhythmic but for now, I would continue with a conservative/watchful waiting approach.  2. Junctional rhythm Asymptomatic.  No indication for permanent pacemaker at this time.  3. Essential hypertension Controlled.   Follow-up as needed.   Medication Adjustments/Labs and Tests Ordered: Current medicines are reviewed at length with the patient today.  Concerns regarding medicines are outlined above.  Orders Placed This Encounter  Procedures   EKG 12-Lead    No orders of the defined types were placed in  this encounter.    Signed, Hilton Cork. Quentin Ore, MD, [facility],  [facility] 11/16/2020 9:54 AM    Electrophysiology [facility]

## 2020-11-16 NOTE — Patient Instructions (Addendum)
Medication Instructions:  Your physician recommends that you continue on your current medications as directed. Please refer to the Current Medication list given to you today. *If you need a refill on your cardiac medications before your next appointment, please call your pharmacy*  Lab Work: None ordered. If you have labs (blood work) drawn today and your tests are completely normal, you will receive your results only by: MyChart Message (if you have MyChart) OR A paper copy in the mail If you have any lab test that is abnormal or we need to change your treatment, we will call you to review the results.  Testing/Procedures: None ordered.  Follow-Up: At [facility], you and your health needs are our priority.  As part of our continuing mission to provide you with exceptional heart care, we have created designated Provider Care Teams.  These Care Teams include your primary Cardiologist (physician) and Advanced Practice Providers (APPs -  Physician Assistants and Nurse Practitioners) who all work together to provide you with the care you need, when you need it.  Your next appointment:   Your physician wants you to follow-up in: as needed with CAMERON T LAMBERT, MD   

## 2020-12-06 ENCOUNTER — Ambulatory Visit (INDEPENDENT_AMBULATORY_CARE_PROVIDER_SITE_OTHER): Payer: Medicare Other | Admitting: Otolaryngology

## 2020-12-06 ENCOUNTER — Other Ambulatory Visit: Payer: Self-pay

## 2020-12-06 DIAGNOSIS — R040 Epistaxis: Secondary | ICD-10-CM | POA: Diagnosis not present

## 2020-12-06 NOTE — Progress Notes (Signed)
HPI: Kevin Schwartz is a 87 y.o. male who presents is referred by Dr. Haroldine Laws for evaluation of recurrent right-sided epistaxis that he has had now for about 2 or 3 months.  The last time he bled was about a week a week ago.  He is on no blood thinners.  He is having no difficulty breathing through his nose.[name]  Past Medical History:  Diagnosis Date   Anemia due to chronic kidney disease    Bilateral inguinal hernia    CAD (coronary artery disease) cardiologist-  dr bensimhon   PTCA of OM in 1998, [place] in 2000, Cath 9/07 LM ok LAD ok. LCX 95% in OM prior to previous stent RCA. nondominant normal EF normal. Cypher DES to OM 2007 (PLACED PROXIAMAL TO PREVIOUS STENT)   Chronic kidney disease, stage III (moderate) ([place])    nephrologist-  dr detarding   Depression    Diabetes mellitus type 2, diet-controlled ([place])    followed by pcp,  last A1c 5.2 on 09-10-2017 in epic   Gout, unspecified    10-29-2017  per pt stable   HLD (hyperlipidemia)    HTN (hypertension)    MGUS (monoclonal gammopathy of unknown significance) previously followed by dr Beryle Beams , [place] Clement and released 08/01/2011   IgG kappa dx 2002 8% plasma cells in bone marrow; no lesions on bone X-rays;   Nocturia    OA (osteoarthritis)    OSA on CPAP    per study 01-30-2004  severe osa , AHI 51.6/hr   PAD (peripheral artery disease) ([place])    05-21-2006  left renal artery stenosis, s/p balloon angioplasty and stenting;  last duplex 02/ 2012  normal , arteries patent   S/P coronary artery stent placement    2000--  BMS x1  to OM;   2007-- DES x1  to OM proximal to previous stent   Secondary hyperparathyroidism of renal origin [facility])    Urgency of urination    Wears glasses    Past Surgical History:  Procedure Laterality Date   CARDIOVASCULAR STRESS TEST  2010   per dr bensimhon epic note dated 04-18-2016  normal   CORONARY ANGIOPLASTY  1998   PTCA to OM   CORONARY ANGIOPLASTY WITH STENT PLACEMENT  2000   PCI and BMS  x1 OM   CORONARY ANGIOPLASTY WITH STENT PLACEMENT  12-18-2005  dr Claiborne Billings   DES x1 to proximal to previously placed stent in OM   CORONARY BALLOON ANGIOPLASTY N/A 08/02/2020   Procedure: CORONARY BALLOON ANGIOPLASTY;  Surgeon: Troy Sine, MD;  Location: [place] [facility];  Service: Cardiovascular;  Laterality: N/A;   CORONARY STENT INTERVENTION N/A 08/02/2020   Procedure: CORONARY STENT INTERVENTION;  Surgeon: Troy Sine, MD;  Location: [place] [facility];  Service: Cardiovascular;  Laterality: N/A;   HERNIA REPAIR     INGUINAL HERNIA REPAIR Bilateral 10/30/2017   Procedure: LAPAROSCOPIC  BILATERAL INGUINAL HERNIA REPAIR  AND LEFT [place];  Surgeon: Michael Boston, MD;  Location: [place];  Service: General;  Laterality: Bilateral;   INSERTION OF MESH Bilateral 10/30/2017   Procedure: INSERTION OF MESH;  Surgeon: Michael Boston, MD;  Location: [place];  Service: General;  Laterality: Bilateral;   LEFT HEART CATH AND CORONARY ANGIOGRAPHY N/A 08/01/2020   Procedure: LEFT HEART CATH AND CORONARY ANGIOGRAPHY;  Surgeon: Troy Sine, MD;  Location: [place] [facility];  Service: Cardiovascular;  Laterality: N/A;   RENAL ANGIOPLASTY Left 05-21-2006  dr berry   and stenting   TOTAL KNEE ARTHROPLASTY Left 07-22-2005   dr Shellia Carwin  [facility]   TRANSTHORACIC ECHOCARDIOGRAM  04/18/2016   ef 01-02%, grade 1 diastolic dysfunction/  mild to moderate AR/  mild MR   Social History   Socioeconomic History   Marital status: Widowed    Spouse name: Not on file   Number of children: 1   Years of education: Not on file   Highest education level: Not on file  Occupational History   Occupation: retired   Tobacco Use   Smoking status: Some Days    Packs/day: 0.25    Types: Cigarettes   Smokeless tobacco: Never   Tobacco comments:    10-29-2017  per pt was smoking 1pp2d, stopped June 2019  Vaping Use   Vaping Use: Never used  Substance and Sexual  Activity   Alcohol use: Not Currently   Drug use: No   Sexual activity: Not on file  Other Topics Concern   Not on file  Social History Narrative   Widowed 02/2012   Lives along   Stopped smoking 2004   Alcohol none   Exercise works in [place] Strain: Not on file  Food Insecurity: Not on file  Transportation Needs: Not on file  Physical Activity: Not on file  Stress: Not on file  Social Connections: Not on file   Family History  Problem Relation Age of Onset   Heart disease Mother    Healthy Father    Heart disease Sister    Heart disease Sister    Cancer Sister        type unknown   Hypertension Sister    Diabetes Brother    Diabetes Brother    Diabetes Other    Coronary artery disease Other    Cancer Other    Allergies  Allergen Reactions   Lisinopril Swelling and Other (See Comments)    Angioedema    Losartan Potassium Swelling and Other (See Comments)    Angioedema    Crestor [Rosuvastatin] Swelling and Other (See Comments)    Angioedema    Tape Other (See Comments)    Irritates the skin   Prior to Admission medications   Medication Sig Start Date End Date Taking? Authorizing Provider  ACCU-CHEK SOFTCLIX LANCETS lancets Use to test blood sugar daily. Dx:E11.8 03/25/17   Reed, Tiffany L, DO  Alcohol Swabs (B-D SINGLE USE SWABS REGULAR) PADS Use with testing of blood sugar. Dx:E11.8 03/25/17   Reed, Tiffany L, DO  allopurinol (ZYLOPRIM) 100 MG tablet Take 1 tablet (100 mg total) by mouth daily. 10/22/20   [name], MD  amLODipine (NORVASC) 5 MG tablet Take 1 tablet (5 mg total) by mouth daily. 09/05/19   Reed, Tiffany L, DO  apixaban (ELIQUIS) 5 MG TABS tablet Take 1 tablet (5 mg total) by mouth 2 (two) times daily. 10/24/20   Almyra Deforest, PA  atorvastatin (LIPITOR) 80 MG tablet Take 1 tablet (80 mg total) by mouth daily. 10/22/20 01/20/21  [name], MD  citalopram  (CELEXA) 20 MG tablet Take 1 tablet (20 mg total) by mouth daily. 10/22/20   [name], MD  clopidogrel (PLAVIX) 75 MG tablet Take 1 tablet (75 mg total) by mouth daily with breakfast. 08/04/20 07/30/21  Kayleen Memos, DO  dapagliflozin propanediol (FARXIGA) 5 MG TABS tablet Take 1 tablet (5 mg  total) by mouth daily before breakfast. 10/29/20   Bensimhon, Shaune Pascal, MD  ezetimibe (ZETIA) 10 MG tablet Take 1 tablet (10 mg total) by mouth daily. 10/26/20   Almyra Deforest, PA  glucose blood (ACCU-CHEK AVIVA PLUS) test strip Use to check blood sugar daily. Dx:E11.8 09/05/19   Reed, Tiffany L, DO  lidocaine (LIDODERM) 5 % Place 1 patch onto the skin daily. Remove & Discard patch within 12 hours or as directed by MD 04/01/20   Maudie Flakes, MD  nitroGLYCERIN (NITROSTAT) 0.4 MG SL tablet DISSOLVE 1 TABLET UNDER THE TONGUE EVERY 5 MINUTES AS  NEEDED FOR CHEST PAIN. MAX  OF 3 TABLETS IN 15 MINUTES. CALL 911 IF PAIN PERSISTS. 11/08/20   [name], MD  ULTRA-THIN II MINI PEN NEEDLE 31G X 5 MM MISC  08/29/14   [provider]     Positive ROS: Otherwise negative  All other systems have been reviewed and were otherwise negative with the exception of those mentioned in the HPI and as above.  Physical Exam: Constitutional: Alert, well-appearing, no acute distress Ears: External ears without lesions or tenderness. Ear canals are clear bilaterally with intact, clear TMs.  Nasal: External nose without lesions. Septum relatively midline.  Has a prominent vessel on the right anterior upper septum with some surrounding erythema that appears to be the origin of his recent epistaxis.  This was cauterized in the office today using silver nitrate.. Clear nasal passages Oral: Lips and gums without lesions. Tongue and palate mucosa without lesions. Posterior oropharynx clear. Neck: No palpable adenopathy or masses Respiratory: Breathing comfortably  Skin: No facial/neck lesions or rash noted.  Control of  epistaxis  Date/Time: 12/06/2020 3:32 PM Performed by: Rozetta Nunnery, MD Authorized by: Rozetta Nunnery, MD   Consent:    Consent obtained:  Verbal   Consent given by:  Patient Anesthesia:    Anesthesia method:  None Procedure details:    Treatment site:  R anterior   Treatment method:  Silver nitrate   Treatment complexity:  Limited   Treatment episode: initial   Comments:     Patient had a right anterior superior septal vessel cauterized in the office today and tolerated this well.  Assessment: Recurrent right-sided epistaxis  Plan: This was cauterized in the office today using silver nitrate. He will follow-up as needed any further nosebleeds.   Radene Journey, MD   CC:

## 2020-12-07 ENCOUNTER — Ambulatory Visit (INDEPENDENT_AMBULATORY_CARE_PROVIDER_SITE_OTHER): Payer: Medicare Other | Admitting: Otolaryngology

## 2020-12-07 DIAGNOSIS — R040 Epistaxis: Secondary | ICD-10-CM | POA: Diagnosis not present

## 2020-12-07 NOTE — Progress Notes (Signed)
HPI: Kevin Schwartz is a 87 y.o. male who returns today for evaluation of recurrent epistaxis.  He was seen yesterday and had a vessel cauterized superiorly along the right anterior septum.  He apparently woke this morning at 6:30 with bleeding from the right nostril that stopped within 5 to 10 minutes with pressure.  He has had no further bleeding.  Past Medical History:  Diagnosis Date   Anemia due to chronic kidney disease    Bilateral inguinal hernia    CAD (coronary artery disease) cardiologist-  dr bensimhon   PTCA of OM in 1998, [place] in 2000, Cath 9/07 LM ok LAD ok. LCX 95% in OM prior to previous stent RCA. nondominant normal EF normal. Cypher DES to OM 2007 (PLACED PROXIAMAL TO PREVIOUS STENT)   Chronic kidney disease, stage III (moderate) ([place])    nephrologist-  dr detarding   Depression    Diabetes mellitus type 2, diet-controlled ([place])    followed by pcp,  last A1c 5.2 on 09-10-2017 in epic   Gout, unspecified    10-29-2017  per pt stable   HLD (hyperlipidemia)    HTN (hypertension)    MGUS (monoclonal gammopathy of unknown significance) previously followed by dr Beryle Beams , [facility] and released 08/01/2011   IgG kappa dx 2002 8% plasma cells in bone marrow; no lesions on bone X-rays;   Nocturia    OA (osteoarthritis)    OSA on CPAP    per study 01-30-2004  severe osa , AHI 51.6/hr   PAD (peripheral artery disease) ([place])    05-21-2006  left renal artery stenosis, s/p balloon angioplasty and stenting;  last duplex 02/ 2012  normal , arteries patent   S/P coronary artery stent placement    2000--  BMS x1  to OM;   2007-- DES x1  to OM proximal to previous stent   Secondary hyperparathyroidism of renal origin [facility])    Urgency of urination    Wears glasses    Past Surgical History:  Procedure Laterality Date   CARDIOVASCULAR STRESS TEST  2010   per dr bensimhon epic note dated 04-18-2016  normal   CORONARY ANGIOPLASTY  1998   PTCA to OM   CORONARY ANGIOPLASTY WITH  STENT PLACEMENT  2000   PCI and BMS x1 OM   CORONARY ANGIOPLASTY WITH STENT PLACEMENT  12-18-2005  dr Claiborne Billings   DES x1 to proximal to previously placed stent in OM   CORONARY BALLOON ANGIOPLASTY N/A 08/02/2020   Procedure: CORONARY BALLOON ANGIOPLASTY;  Surgeon: Troy Sine, MD;  Location: [place] [facility];  Service: Cardiovascular;  Laterality: N/A;   CORONARY STENT INTERVENTION N/A 08/02/2020   Procedure: CORONARY STENT INTERVENTION;  Surgeon: Troy Sine, MD;  Location: [place] [facility];  Service: Cardiovascular;  Laterality: N/A;   HERNIA REPAIR     INGUINAL HERNIA REPAIR Bilateral 10/30/2017   Procedure: LAPAROSCOPIC  BILATERAL INGUINAL HERNIA REPAIR  AND LEFT [place];  Surgeon: Michael Boston, MD;  Location: [place];  Service: General;  Laterality: Bilateral;   INSERTION OF MESH Bilateral 10/30/2017   Procedure: INSERTION OF MESH;  Surgeon: Michael Boston, MD;  Location: [place];  Service: General;  Laterality: Bilateral;   LEFT HEART CATH AND CORONARY ANGIOGRAPHY N/A 08/01/2020   Procedure: LEFT HEART CATH AND CORONARY ANGIOGRAPHY;  Surgeon: Troy Sine, MD;  Location: [place] [facility];  Service: Cardiovascular;  Laterality: N/A;   RENAL ANGIOPLASTY Left 05-21-2006  dr berry   and stenting   TOTAL KNEE ARTHROPLASTY Left 07-22-2005   dr Shellia Carwin  [facility]   TRANSTHORACIC ECHOCARDIOGRAM  04/18/2016   ef 09-38%, grade 1 diastolic dysfunction/  mild to moderate AR/  mild MR   Social History   Socioeconomic History   Marital status: Widowed    Spouse name: Not on file   Number of children: 1   Years of education: Not on file   Highest education level: Not on file  Occupational History   Occupation: retired   Tobacco Use   Smoking status: Some Days    Packs/day: 0.25    Types: Cigarettes   Smokeless tobacco: Never   Tobacco comments:    10-29-2017  per pt was smoking 1pp2d, stopped June 2019  Vaping Use   Vaping Use:  Never used  Substance and Sexual Activity   Alcohol use: Not Currently   Drug use: No   Sexual activity: Not on file  Other Topics Concern   Not on file  Social History Narrative   Widowed 02/2012   Lives along   Stopped smoking 2004   Alcohol none   Exercise works in [place] Strain: Not on file  Food Insecurity: Not on file  Transportation Needs: Not on file  Physical Activity: Not on file  Stress: Not on file  Social Connections: Not on file   Family History  Problem Relation Age of Onset   Heart disease Mother    Healthy Father    Heart disease Sister    Heart disease Sister    Cancer Sister        type unknown   Hypertension Sister    Diabetes Brother    Diabetes Brother    Diabetes Other    Coronary artery disease Other    Cancer Other    Allergies  Allergen Reactions   Lisinopril Swelling and Other (See Comments)    Angioedema    Losartan Potassium Swelling and Other (See Comments)    Angioedema    Crestor [Rosuvastatin] Swelling and Other (See Comments)    Angioedema    Tape Other (See Comments)    Irritates the skin   Prior to Admission medications   Medication Sig Start Date End Date Taking? Authorizing Provider  ACCU-CHEK SOFTCLIX LANCETS lancets Use to test blood sugar daily. Dx:E11.8 03/25/17   Reed, Tiffany L, DO  Alcohol Swabs (B-D SINGLE USE SWABS REGULAR) PADS Use with testing of blood sugar. Dx:E11.8 03/25/17   Reed, Tiffany L, DO  allopurinol (ZYLOPRIM) 100 MG tablet Take 1 tablet (100 mg total) by mouth daily. 10/22/20   [name], MD  amLODipine (NORVASC) 5 MG tablet Take 1 tablet (5 mg total) by mouth daily. 09/05/19   Reed, Tiffany L, DO  apixaban (ELIQUIS) 5 MG TABS tablet Take 1 tablet (5 mg total) by mouth 2 (two) times daily. 10/24/20   Almyra Deforest, PA  atorvastatin (LIPITOR) 80 MG tablet Take 1 tablet (80 mg total) by mouth daily. 10/22/20 01/20/21  [name], MD  citalopram (CELEXA) 20 MG tablet Take 1 tablet (20 mg total) by mouth daily. 10/22/20   [name], MD  clopidogrel (PLAVIX) 75 MG tablet Take 1 tablet (75 mg total) by mouth daily with breakfast. 08/04/20 07/30/21  Kayleen Memos, DO  dapagliflozin propanediol (FARXIGA) 5 MG TABS tablet Take 1 tablet (5 mg  total) by mouth daily before breakfast. 10/29/20   Bensimhon, Shaune Pascal, MD  ezetimibe (ZETIA) 10 MG tablet Take 1 tablet (10 mg total) by mouth daily. 10/26/20   Almyra Deforest, PA  glucose blood (ACCU-CHEK AVIVA PLUS) test strip Use to check blood sugar daily. Dx:E11.8 09/05/19   Reed, Tiffany L, DO  lidocaine (LIDODERM) 5 % Place 1 patch onto the skin daily. Remove & Discard patch within 12 hours or as directed by MD 04/01/20   Maudie Flakes, MD  nitroGLYCERIN (NITROSTAT) 0.4 MG SL tablet DISSOLVE 1 TABLET UNDER THE TONGUE EVERY 5 MINUTES AS  NEEDED FOR CHEST PAIN. MAX  OF 3 TABLETS IN 15 MINUTES. CALL 911 IF PAIN PERSISTS. 11/08/20   [name], MD  ULTRA-THIN II MINI PEN NEEDLE 31G X 5 MM MISC  08/29/14   [provider]     Positive ROS: Otherwise negative  All other systems have been reviewed and were otherwise negative with the exception of those mentioned in the HPI and as above.  Physical Exam: Constitutional: Alert, well-appearing, no acute distress Ears: External ears without lesions or tenderness. Ear canals are clear bilaterally with intact, clear TMs.  Nasal: External nose without lesions. Septum there is a scab superiorly along the septum on the right side and just inferior to the scab is a little bit of fresh dried blood that was removed with a Q-tip and had minimal bleeding that was cauterized inferiorly with silver nitrate.  Nasal passageway was otherwise clear and there was no blood clot within the nasal passageway.  Oral: Lips and gums without lesions. Tongue and palate mucosa without lesions. Posterior oropharynx clear. Neck: No palpable  adenopathy or masses Respiratory: Breathing comfortably  Skin: No facial/neck lesions or rash noted.  Procedures  Assessment: Recurrent right-sided epistaxis  Plan: This was cauterized again with silver nitrate. Reviewed with the patient as well as his son concerning use of a cottonball with Afrin or cold water on the cottonball to stop bleeding if he has any recurrent bleeding. If he continues to have recurrent bleeding more than once or twice a week he will follow-up for recheck as needed.   Radene Journey, MD

## 2021-01-15 ENCOUNTER — Other Ambulatory Visit: Payer: Self-pay

## 2021-01-15 DIAGNOSIS — E785 Hyperlipidemia, unspecified: Secondary | ICD-10-CM

## 2021-01-17 DIAGNOSIS — E785 Hyperlipidemia, unspecified: Secondary | ICD-10-CM | POA: Diagnosis not present

## 2021-01-17 LAB — LIPID PANEL
Chol/HDL Ratio: 2.1 ratio (ref 0.0–5.0)
Cholesterol, Total: 94 mg/dL — ABNORMAL LOW (ref 100–199)
HDL: 44 mg/dL (ref >39)
LDL Chol Calc (NIH): 36 mg/dL (ref 0–99)
Triglycerides: 60 mg/dL (ref 0–149)
VLDL Cholesterol Cal: 14 mg/dL (ref 5–40)

## 2021-01-17 LAB — HEPATIC FUNCTION PANEL
ALT: 56 IU/L — ABNORMAL HIGH (ref 0–44)
AST: 44 IU/L — ABNORMAL HIGH (ref 0–40)
Albumin: 4.4 g/dL (ref 3.6–4.6)
Alkaline Phosphatase: 151 IU/L — ABNORMAL HIGH (ref 44–121)
Bilirubin Total: 0.7 mg/dL (ref 0.0–1.2)
Bilirubin, Direct: 0.29 mg/dL (ref 0.00–0.40)
Total Protein: 7.5 g/dL (ref 6.0–8.5)

## 2021-01-22 IMAGING — CR DG KNEE COMPLETE 4+V*L*
4 series · 4 of 4 positions shown · non-contrast
Comparison: 01/14/2020

CLINICAL DATA: Left knee pain, fell 2 months ago

EXAM:
LEFT KNEE - COMPLETE 4+ VIEW

[knee ap]
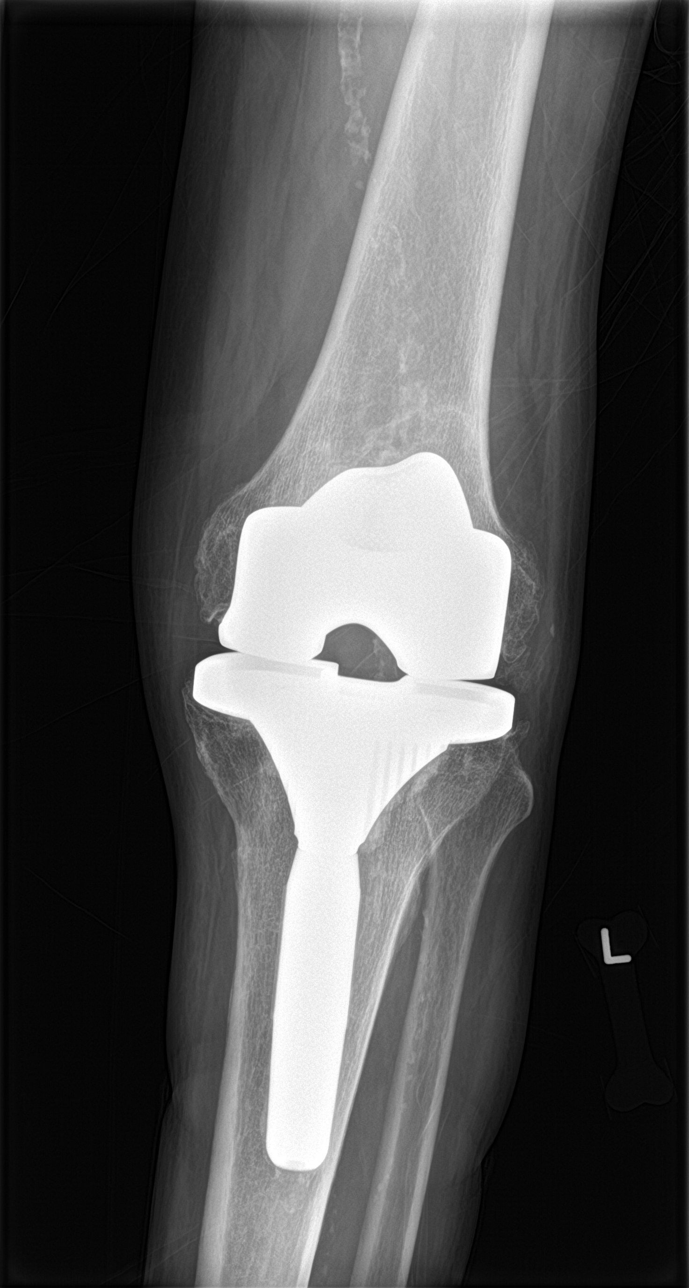

[knee lat]
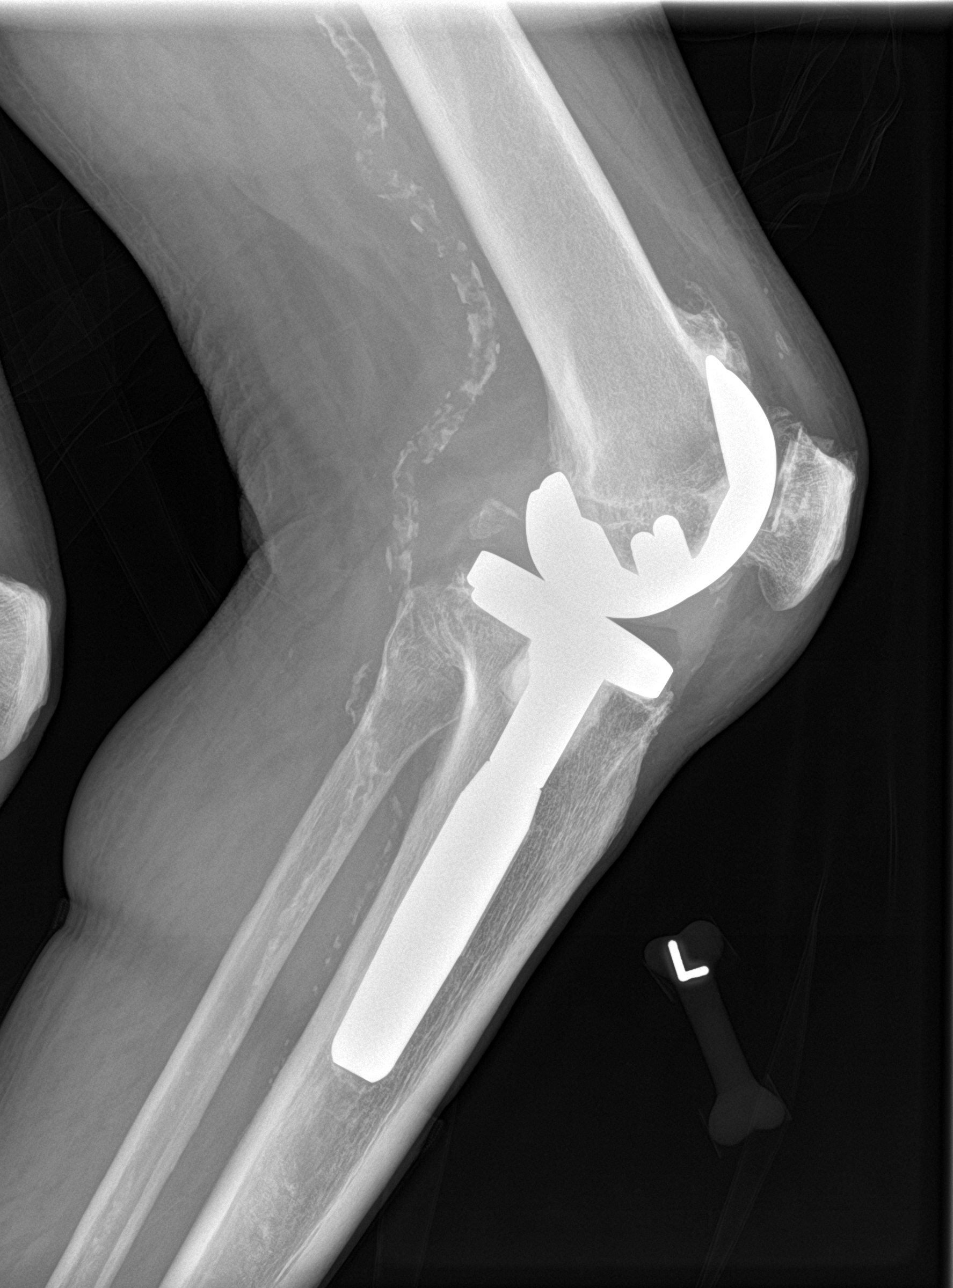

[knee obl (1 of 2)]
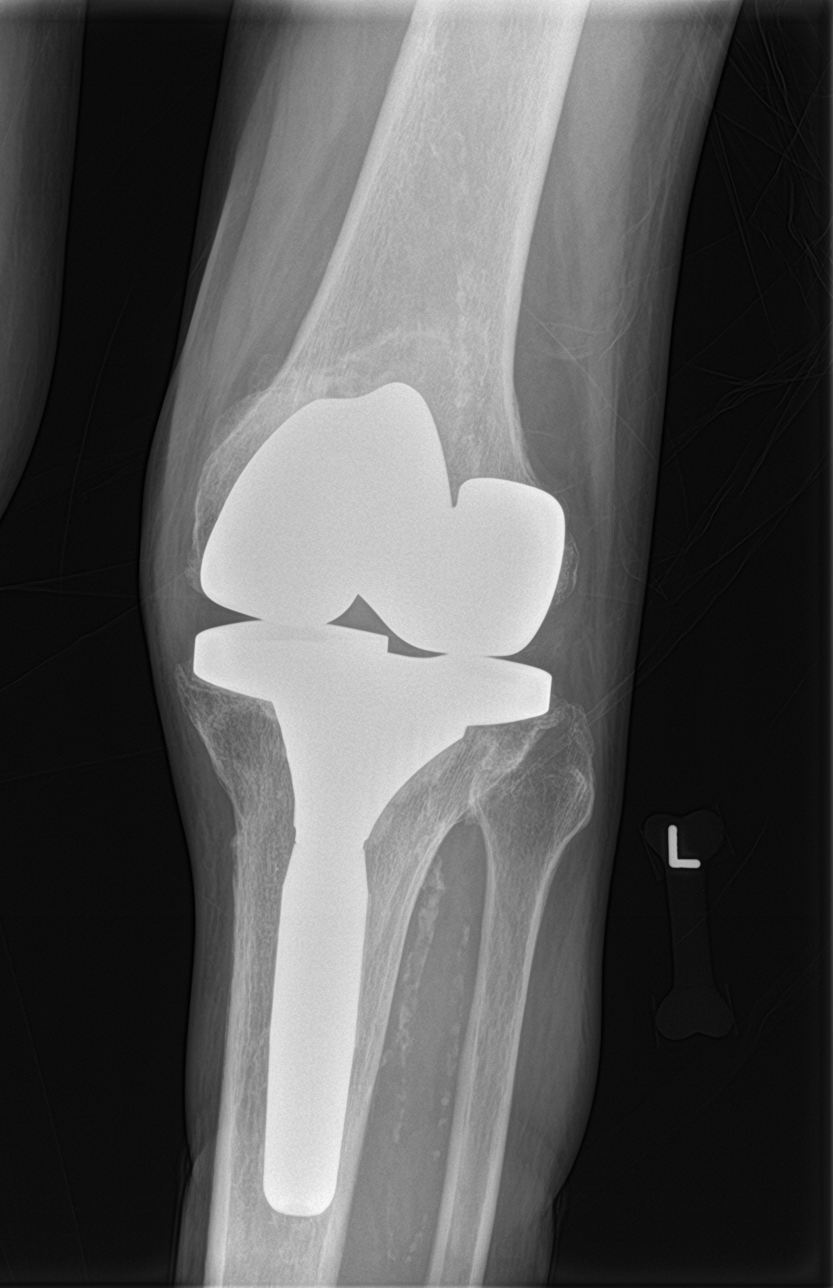

[knee obl (2 of 2)]
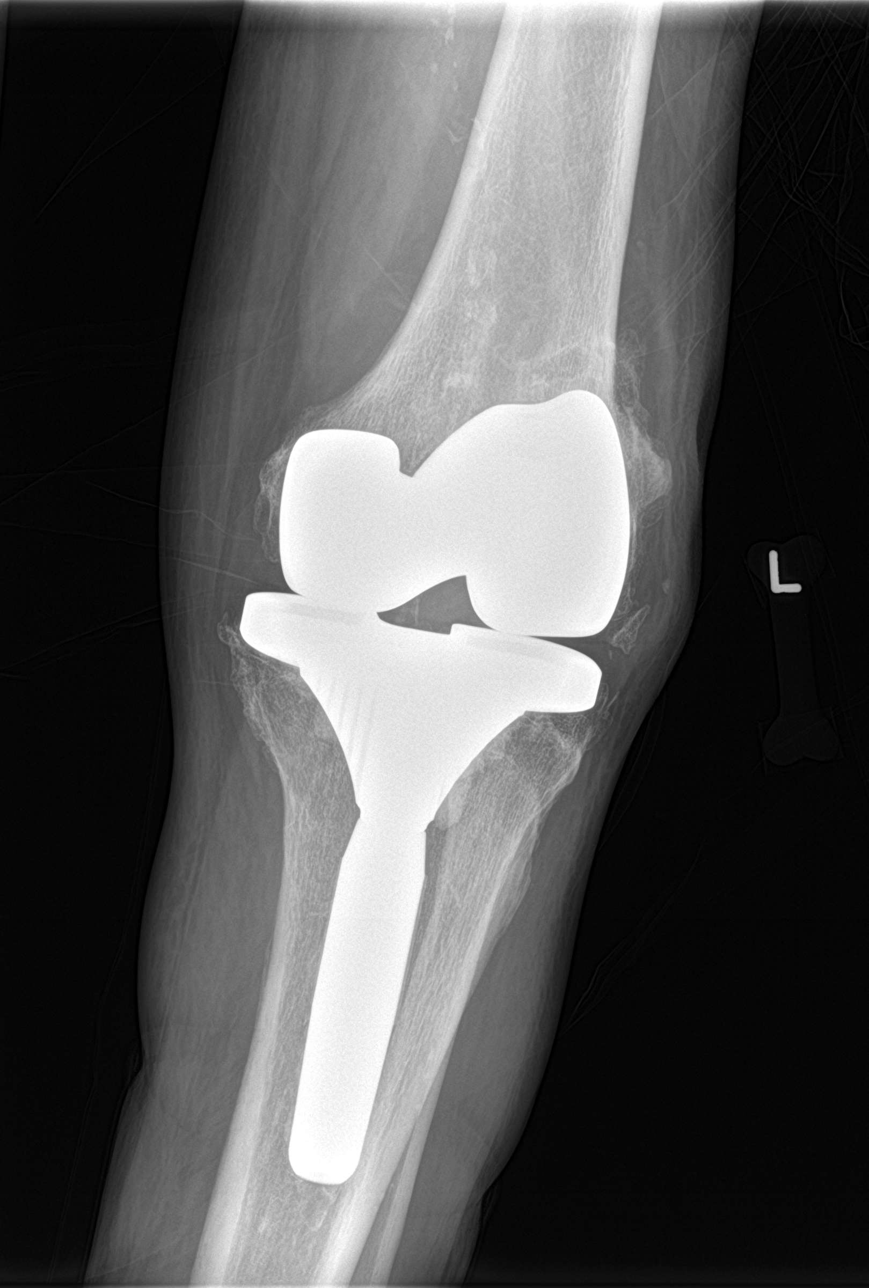

[4 of 4 positions shown; findings below may reference images not displayed]

FINDINGS: Frontal, bilateral oblique, lateral views of the left knee are
obtained. Stable position of the left knee arthroplasty. No acute
displaced fracture. No joint effusion. Soft tissues are
unremarkable. Extensive atherosclerosis again noted.
IMPRESSION: 1. Stable left knee arthroplasty.
2. No acute bony abnormality.

## 2021-01-28 ENCOUNTER — Telehealth: Payer: Self-pay | Admitting: Physician Assistant

## 2021-01-28 DIAGNOSIS — Z79899 Other long term (current) drug therapy: Secondary | ICD-10-CM

## 2021-01-28 NOTE — Telephone Encounter (Signed)
Called pt regarding lab work. Left message to call back.   Almyra Deforest, [place]  01/18/2021  4:15 PM EDT     Cholesterol shows excellent control, however liver enzyme is now elevated, this is a new change since last liver function test in July, the only medication that was added in July was zetia. Will stop zetia, repeat liver function test in 1 month.

## 2021-01-28 NOTE — Telephone Encounter (Signed)
Pt updated with lab results along with MD's recommendations. Pt verbalized understanding and med list updated to reflect change.  Lab slip placed in the mail.

## 2021-01-28 NOTE — Telephone Encounter (Signed)
Patient was returning call for her labs

## 2021-01-31 ENCOUNTER — Other Ambulatory Visit: Payer: Self-pay | Admitting: *Deleted

## 2021-01-31 MED ORDER — ATORVASTATIN CALCIUM 80 MG PO TABS: 80.0000 mg | ORAL_TABLET | Freq: Every day | ORAL | 3 refills | Status: DC

## 2021-01-31 MED ORDER — ALLOPURINOL 100 MG PO TABS: 100.0000 mg | ORAL_TABLET | Freq: Every day | ORAL | 3 refills | Status: AC

## 2021-01-31 MED ORDER — CITALOPRAM HYDROBROMIDE 20 MG PO TABS: 20.0000 mg | ORAL_TABLET | Freq: Every day | ORAL | 3 refills | Status: DC

## 2021-01-31 NOTE — Telephone Encounter (Signed)
[place] requested refill.  Pended Rx and sent to [facility] for approval due to [place]. Dr. Sabra Heck out of office.

## 2021-02-04 DIAGNOSIS — Z79899 Other long term (current) drug therapy: Secondary | ICD-10-CM | POA: Diagnosis not present

## 2021-02-04 LAB — HEPATIC FUNCTION PANEL
ALT: 41 IU/L (ref 0–44)
AST: 36 IU/L (ref 0–40)
Albumin: 4.4 g/dL (ref 3.6–4.6)
Alkaline Phosphatase: 142 IU/L — ABNORMAL HIGH (ref 44–121)
Bilirubin Total: 0.7 mg/dL (ref 0.0–1.2)
Bilirubin, Direct: 0.3 mg/dL (ref 0.00–0.40)
Total Protein: 7.3 g/dL (ref 6.0–8.5)

## 2021-02-06 ENCOUNTER — Telehealth: Payer: Self-pay | Admitting: *Deleted

## 2021-02-06 NOTE — Telephone Encounter (Signed)
Received fax from [place] stating "New Rx for Atorvastatin 80mg  Patient reports Crestor allergy and there is a potential for [employer]. Please Confirm if ok to fill this medication. Indicate Yes or sign below, or indicate No"   Placed form in Dr. Ammie Ferrier folder to review and sign.    To be faxed back to [facility] to Fax:[phone] once completed.

## 2021-02-08 ENCOUNTER — Encounter: Payer: Self-pay | Admitting: *Deleted

## 2021-02-26 ENCOUNTER — Encounter: Payer: Self-pay | Admitting: Family Medicine

## 2021-02-26 ENCOUNTER — Other Ambulatory Visit: Payer: Self-pay

## 2021-02-26 ENCOUNTER — Ambulatory Visit (INDEPENDENT_AMBULATORY_CARE_PROVIDER_SITE_OTHER): Payer: Medicare HMO | Admitting: Family Medicine

## 2021-02-26 VITALS — BP 142/88 | HR 90 | Temp 98.0°F | Ht 67.0 in | Wt 186.4 lb

## 2021-02-26 DIAGNOSIS — I509 Heart failure, unspecified: Secondary | ICD-10-CM | POA: Diagnosis not present

## 2021-02-26 DIAGNOSIS — N1831 Chronic kidney disease, stage 3a: Secondary | ICD-10-CM | POA: Diagnosis not present

## 2021-02-26 DIAGNOSIS — R609 Edema, unspecified: Secondary | ICD-10-CM | POA: Diagnosis not present

## 2021-02-26 DIAGNOSIS — Z23 Encounter for immunization: Secondary | ICD-10-CM | POA: Diagnosis not present

## 2021-02-26 DIAGNOSIS — I10 Essential (primary) hypertension: Secondary | ICD-10-CM | POA: Diagnosis not present

## 2021-02-26 LAB — BASIC METABOLIC PANEL WITH GFR
BUN/Creatinine Ratio: 15 (calc) (ref 6–22)
BUN: 24 mg/dL (ref 7–25)
CO2: 19 mmol/L — ABNORMAL LOW (ref 20–32)
Calcium: 8.9 mg/dL (ref 8.6–10.3)
Chloride: 110 mmol/L (ref 98–110)
Creat: 1.55 mg/dL — ABNORMAL HIGH (ref 0.70–1.22)
Glucose, Bld: 94 mg/dL (ref 65–99)
Potassium: 4.1 mmol/L (ref 3.5–5.3)
Sodium: 140 mmol/L (ref 135–146)
eGFR: 43 mL/min/{1.73_m2} — ABNORMAL LOW (ref >=60)

## 2021-02-26 MED ORDER — FUROSEMIDE 20 MG PO TABS: 20.0000 mg | ORAL_TABLET | Freq: Every day | ORAL | 1 refills | Status: DC

## 2021-02-26 NOTE — Progress Notes (Addendum)
Provider:  Alain Honey, MD  Careteam: Patient Care Team: [name], MD as PCP - General (Family Medicine) Bensimhon, Shaune Pascal, MD as PCP - Cardiology (Cardiology) Bensimhon, Shaune Pascal, MD as Consulting Physician (Cardiology) Penninger, Ria Comment, [place] as Physician Assistant (Nephrology) Donato Heinz, MD as Consulting Physician (Nephrology) Michael Boston, MD as Consulting Physician (General Surgery) Ralene Bathe, MD as Consulting Physician (Ophthalmology)  PLACE OF SERVICE:  [place]  Advanced Directive information    Allergies  Allergen Reactions   Lisinopril Swelling and Other (See Comments)    Angioedema    Losartan Potassium Swelling and Other (See Comments)    Angioedema    Crestor [Rosuvastatin] Swelling and Other (See Comments)    Angioedema    Tape Other (See Comments)    Irritates the skin    Chief Complaint  Patient presents with   Medical Management of Chronic Issues    Patient presents today for a 4 month follow-up.   Quality Metric Gaps     HPI: Patient is a 87 y.o. male Pt presents with edema and 20 lb wt gain since August. Some dyspnea and orthopnea. Last EF from 4-22 showed 45-50%. Denies XS salt. Does have some chronic kidney disease.  Review of Systems:  Review of Systems  Constitutional: Negative.   Respiratory:  Positive for shortness of breath.   Cardiovascular:  Positive for leg swelling.  Genitourinary: Negative.   Musculoskeletal: Negative.   All other systems reviewed and are negative.  Past Medical History:  Diagnosis Date   Anemia due to chronic kidney disease    Bilateral inguinal hernia    CAD (coronary artery disease) cardiologist-  dr bensimhon   PTCA of OM in 1998, [place] in 2000, Cath 9/07 LM ok LAD ok. LCX 95% in OM prior to previous stent RCA. nondominant normal EF normal. Cypher DES to OM 2007 (PLACED PROXIAMAL TO PREVIOUS STENT)   Chronic kidney disease, stage III (moderate) ([place])    nephrologist-   dr detarding   Depression    Diabetes mellitus type 2, diet-controlled ([place])    followed by pcp,  last A1c 5.2 on 09-10-2017 in epic   Gout, unspecified    10-29-2017  per pt stable   HLD (hyperlipidemia)    HTN (hypertension)    MGUS (monoclonal gammopathy of unknown significance) previously followed by dr Beryle Beams , [facility] Clement and released 08/01/2011   IgG kappa dx 2002 8% plasma cells in bone marrow; no lesions on bone X-rays;   Nocturia    OA (osteoarthritis)    OSA on CPAP    per study 01-30-2004  severe osa , AHI 51.6/hr   PAD (peripheral artery disease) ([place])    05-21-2006  left renal artery stenosis, s/p balloon angioplasty and stenting;  last duplex 02/ 2012  normal , arteries patent   S/P coronary artery stent placement    2000--  BMS x1  to OM;   2007-- DES x1  to OM proximal to previous stent   Secondary hyperparathyroidism of renal origin [facility])    Urgency of urination    Wears glasses    Past Surgical History:  Procedure Laterality Date   CARDIOVASCULAR STRESS TEST  2010   per dr bensimhon epic note dated 04-18-2016  normal   CORONARY ANGIOPLASTY  1998   PTCA to OM   CORONARY ANGIOPLASTY WITH STENT PLACEMENT  2000   PCI and BMS x1 OM   CORONARY ANGIOPLASTY WITH STENT PLACEMENT  12-18-2005  dr Claiborne Billings  DES x1 to proximal to previously placed stent in OM   CORONARY BALLOON ANGIOPLASTY N/A 08/02/2020   Procedure: CORONARY BALLOON ANGIOPLASTY;  Surgeon: Troy Sine, MD;  Location: [place] [facility];  Service: Cardiovascular;  Laterality: N/A;   CORONARY STENT INTERVENTION N/A 08/02/2020   Procedure: CORONARY STENT INTERVENTION;  Surgeon: Troy Sine, MD;  Location: [place] [facility];  Service: Cardiovascular;  Laterality: N/A;   HERNIA REPAIR     INGUINAL HERNIA REPAIR Bilateral 10/30/2017   Procedure: LAPAROSCOPIC  BILATERAL INGUINAL HERNIA REPAIR  AND LEFT [place];  Surgeon: Michael Boston, MD;  Location: [place];  Service: General;   Laterality: Bilateral;   INSERTION OF MESH Bilateral 10/30/2017   Procedure: INSERTION OF MESH;  Surgeon: Michael Boston, MD;  Location: [place];  Service: General;  Laterality: Bilateral;   LEFT HEART CATH AND CORONARY ANGIOGRAPHY N/A 08/01/2020   Procedure: LEFT HEART CATH AND CORONARY ANGIOGRAPHY;  Surgeon: Troy Sine, MD;  Location: [place] [facility];  Service: Cardiovascular;  Laterality: N/A;   RENAL ANGIOPLASTY Left 05-21-2006   dr berry   and stenting   TOTAL KNEE ARTHROPLASTY Left 07-22-2005   dr Shellia Carwin  [facility]   TRANSTHORACIC ECHOCARDIOGRAM  04/18/2016   ef 60-45%, grade 1 diastolic dysfunction/  mild to moderate AR/  mild MR   Social History:   reports that he has been smoking cigarettes. He has been smoking an average of .25 packs per day. He has never used smokeless tobacco. He reports that he does not currently use alcohol. He reports that he does not use drugs.  Family History  Problem Relation Age of Onset   Heart disease Mother    Healthy Father    Heart disease Sister    Heart disease Sister    Cancer Sister        type unknown   Hypertension Sister    Diabetes Brother    Diabetes Brother    Diabetes Other    Coronary artery disease Other    Cancer Other     Medications: Patient's Medications  New Prescriptions   No medications on file  Previous Medications   ACCU-CHEK SOFTCLIX LANCETS LANCETS    Use to test blood sugar daily. Dx:E11.8   ALCOHOL SWABS (B-D SINGLE USE SWABS REGULAR) PADS    Use with testing of blood sugar. Dx:E11.8   ALLOPURINOL (ZYLOPRIM) 100 MG TABLET    Take 1 tablet (100 mg total) by mouth daily.   AMLODIPINE (NORVASC) 5 MG TABLET    Take 1 tablet (5 mg total) by mouth daily.   APIXABAN (ELIQUIS) 5 MG TABS TABLET    Take 1 tablet (5 mg total) by mouth 2 (two) times daily.   ATORVASTATIN (LIPITOR) 80 MG TABLET    Take 1 tablet (80 mg total) by mouth daily.   CITALOPRAM (CELEXA) 20 MG TABLET    Take 1 tablet (20 mg  total) by mouth daily.   CLOPIDOGREL (PLAVIX) 75 MG TABLET    Take 1 tablet (75 mg total) by mouth daily with breakfast.   DAPAGLIFLOZIN PROPANEDIOL (FARXIGA) 5 MG TABS TABLET    Take 1 tablet (5 mg total) by mouth daily before breakfast.   EZETIMIBE (ZETIA) 10 MG TABLET    Take 10 mg by mouth daily.   GLUCOSE BLOOD (ACCU-CHEK AVIVA PLUS) TEST STRIP    Use to check blood sugar daily. Dx:E11.8   LIDOCAINE (LIDODERM) 5 %  Place 1 patch onto the skin daily. Remove & Discard patch within 12 hours or as directed by MD   NITROGLYCERIN (NITROSTAT) 0.4 MG SL TABLET    DISSOLVE 1 TABLET UNDER THE TONGUE EVERY 5 MINUTES AS  NEEDED FOR CHEST PAIN. MAX  OF 3 TABLETS IN 15 MINUTES. CALL 911 IF PAIN PERSISTS.   ULTRA-THIN II MINI PEN NEEDLE 31G X 5 MM MISC      Modified Medications   No medications on file  Discontinued Medications   No medications on file    Physical Exam:  Vitals:   02/26/21 0927  BP: (!) 142/88  Pulse: 90  Temp: 98 F (36.7 C)  SpO2: 98%  Weight: 186 lb 6.4 oz (84.6 kg)  Height: '5\' 7"'  (1.702 m)   Body mass index is 29.19 kg/m. Wt Readings from Last 3 Encounters:  02/26/21 186 lb 6.4 oz (84.6 kg)  11/16/20 167 lb (75.8 kg)  10/29/20 164 lb 6.4 oz (74.6 kg)    Physical Exam Vitals and nursing note reviewed.  Constitutional:      Appearance: Normal appearance.  Cardiovascular:     Rate and Rhythm: Normal rate and regular rhythm.  Pulmonary:     Effort: Pulmonary effort is normal.     Breath sounds: Normal breath sounds.  Musculoskeletal:     Right lower leg: Edema present.     Left lower leg: Edema present.     Comments: 3+ edema  Neurological:     Mental Status: He is alert.    Labs reviewed: Basic Metabolic Panel: Recent Labs    07/31/20 2227 08/02/20 0638 08/03/20 0148 08/07/20 0000 10/29/20 1257  NA  --    < > 136 137 139  K  --    < > 4.4 4.9 5.0  CL  --    < > 107 107 104  CO2  --    < > 17* 17* 25  GLUCOSE  --    < > 84 80 68*  BUN  --    <  > '11 17 14  ' CREATININE  --    < > 1.18 1.38* 1.37*  CALCIUM  --    < > 8.4* 9.1 9.3  TSH 2.483  --   --   --   --    < > = values in this interval not displayed.   Liver Function Tests: Recent Labs    10/29/20 1257 01/17/21 1023 02/04/21 1030  AST 21 44* 36  ALT 14 56* 41  ALKPHOS 78 151* 142*  BILITOT 0.6 0.7 0.7  PROT 8.3* 7.5 7.3  ALBUMIN 4.0 4.4 4.4   No results for input(s): LIPASE, AMYLASE in the last 8760 hours. No results for input(s): AMMONIA in the last 8760 hours. CBC: Recent Labs    07/27/20 1215 07/31/20 1540 08/01/20 0320 08/03/20 0148 08/07/20 0000 10/29/20 1257  WBC 13.5* 13.3*   < > 11.4* 8.2 7.4  NEUTROABS 12.0* 11.1*  --   --  5,838  --   HGB 13.0 13.8   < > 10.9* 10.5* 11.5*  HCT 39.8 42.3   < > 33.3* 33.0* 36.7*  MCV 94.8 94.6   < > 93.3 94.6 95.6  PLT 339 421*   < > 501* 465* 419*   < > = values in this interval not displayed.   Lipid Panel: Recent Labs    08/01/20 0320 10/24/20 1214 01/17/21 1023  CHOL 106 165 94*  HDL 39* 66 44  LDLCALC 57 85 36  TRIG 50 76 60  CHOLHDL 2.7 2.5 2.1   TSH: Recent Labs    07/31/20 2227  TSH 2.483   A1C: Lab Results  Component Value Date   HGBA1C 5.3 06/13/2020     Assessment/Plan  1. Need for influenza vaccination  - Flu Vaccine QUAD High Dose(Fluad)  2. Edema, unspecified type Concern re CHF. Will begin diuretc therapy and follow.  Elevate feet whenever possible.  May need increased dose of diuretic and depending on response may need to get him back to see cardiology - Brain Natriuretic Peptide - BMP with eGFR([facility])  3. Stage 3a chronic kidney disease ([place]) Will check renal function and lytes today  4. Essential hypertension BP okay today.    6. Acute congestive heart failure, unspecified heart failure type (HCC)  - furosemide (LASIX) 20 MG tablet; Take 1 tablet (20 mg total) by mouth daily.  Dispense: 30 tablet; Refill: [place], MD [place] [phone]

## 2021-02-27 LAB — BRAIN NATRIURETIC PEPTIDE: Brain Natriuretic Peptide: 1279 pg/mL — ABNORMAL HIGH (ref <100)

## 2021-03-19 ENCOUNTER — Telehealth: Payer: Self-pay | Admitting: Cardiology

## 2021-03-19 NOTE — Telephone Encounter (Signed)
Advised this patient that his PCP had done the labs. It appears like he has already been called with the results. Went over them again with him. Verbalized understanding.

## 2021-03-19 NOTE — Telephone Encounter (Signed)
Follow Up:    Patient calling back to get lab results from last month.

## 2021-03-20 ENCOUNTER — Other Ambulatory Visit: Payer: Self-pay | Admitting: Family Medicine

## 2021-03-20 DIAGNOSIS — I509 Heart failure, unspecified: Secondary | ICD-10-CM

## 2021-04-11 ENCOUNTER — Encounter: Payer: Self-pay | Admitting: Family

## 2021-04-11 ENCOUNTER — Ambulatory Visit (INDEPENDENT_AMBULATORY_CARE_PROVIDER_SITE_OTHER): Payer: Medicare HMO | Admitting: Family

## 2021-04-11 ENCOUNTER — Other Ambulatory Visit: Payer: Self-pay

## 2021-04-11 VITALS — BP 100/90 | HR 76 | Temp 97.1°F | Resp 16 | Ht 67.0 in | Wt 176.2 lb

## 2021-04-11 DIAGNOSIS — R0989 Other specified symptoms and signs involving the circulatory and respiratory systems: Secondary | ICD-10-CM | POA: Diagnosis not present

## 2021-04-11 DIAGNOSIS — R600 Localized edema: Secondary | ICD-10-CM

## 2021-04-11 DIAGNOSIS — R059 Cough, unspecified: Secondary | ICD-10-CM

## 2021-04-11 DIAGNOSIS — R6883 Chills (without fever): Secondary | ICD-10-CM | POA: Diagnosis not present

## 2021-04-11 DIAGNOSIS — I509 Heart failure, unspecified: Secondary | ICD-10-CM

## 2021-04-11 DIAGNOSIS — R0602 Shortness of breath: Secondary | ICD-10-CM

## 2021-04-11 LAB — POCT INFLUENZA A/B
Influenza A, POC: NEGATIVE
Influenza B, POC: NEGATIVE

## 2021-04-11 MED ORDER — POTASSIUM CHLORIDE CRYS ER 20 MEQ PO TBCR: 20.0000 meq | EXTENDED_RELEASE_TABLET | Freq: Every day | ORAL | 0 refills | Status: DC

## 2021-04-11 MED ORDER — AZITHROMYCIN 250 MG PO TABS: ORAL_TABLET | ORAL | 0 refills | Status: DC

## 2021-04-11 NOTE — Patient Instructions (Signed)
Furosemide 20 mg tablet take 2 tablet by mouth daily for 3 days  Then resume 20 mg tablet one by mouth daily   - Take along with Potassium chloride 20 meq tablet one by mouth daily.  - Notify provider or go to ED if symptoms worsen or fail to improve

## 2021-04-11 NOTE — Progress Notes (Signed)
Provider: Dyamond Tolosa FNP-C  [name], MD  Patient Care Team: [name], MD as PCP - General (Family Medicine) Bensimhon, Shaune Pascal, MD as PCP - Cardiology (Cardiology) Bensimhon, Shaune Pascal, MD as Consulting Physician (Cardiology) Penninger, Ria Comment, [place] as Physician Assistant (Nephrology) Donato Heinz, MD as Consulting Physician (Nephrology) Michael Boston, MD as Consulting Physician (General Surgery) Ralene Bathe, MD as Consulting Physician (Ophthalmology)  Extended Emergency Contact Information Primary Emergency Contact: Alroy Bailiff, [place] Johnnette Litter of [place] Phone: [phone] Relation: Sister Secondary Emergency Contact: [place] Phone: [phone] Relation: Son  Code Status:  DNR Goals of care: Advanced Directive information Advanced Directives 04/11/2021  Does Patient Have a Medical Advance Directive? No  Type of Advance Directive -  Does patient want to make changes to medical advance directive? -  Copy of [place] in Chart? -  Would patient like information on creating a medical advance directive? No - Patient declined     Chief Complaint  Patient presents with   Acute Visit    Patient complains of fatigue, SOB, loss of appetite, shaking, feeling cold, and weight loss.     HPI:  Pt is a 87 y.o. male seen today for an acute visit for evaluation of cough,shortness of breath,shaking,cold x 1 week.Has not been in contact with sick person with COVID-19. Son states patient called saying he was shaking thinks " his nerves are acting up". He denies any anxiety.  Shortness of breath worst with exertion.Has hx of CHF on lasix 20 mg tablet daily.   Has had weight loss previous weight was 186 lbs today weight down to 176.2 lbs.Not skipping meals.Son brings cooked meals to him. Patient was shaking upon entering the exam room but had a T-shirt on and jacket off.He was advised to wear  his jacket and shaking improved.  CMA was unable to obtain oxygen saturation due to patient's cold fingers.hand warmer was given.Oxygen saturation rechecked was 92%  Past Medical History:  Diagnosis Date   Anemia due to chronic kidney disease    Bilateral inguinal hernia    CAD (coronary artery disease) cardiologist-  dr bensimhon   PTCA of OM in 1998, [facility] OM in 2000, Cath 9/07 LM ok LAD ok. LCX 95% in OM prior to previous stent RCA. nondominant normal EF normal. [place] DES to OM 2007 (PLACED PROXIAMAL TO PREVIOUS STENT)   Chronic kidney disease, stage III (moderate) ([place])    nephrologist-  dr detarding   Depression    Diabetes mellitus type 2, diet-controlled ([place])    followed by pcp,  last A1c 5.2 on 09-10-2017 in epic   Gout, unspecified    10-29-2017  per pt stable   HLD (hyperlipidemia)    HTN (hypertension)    MGUS (monoclonal gammopathy of unknown significance) previously followed by dr Beryle Beams , [facility] and released 08/01/2011   IgG kappa dx 2002 8% plasma cells in bone marrow; no lesions on bone X-rays;   Nocturia    OA (osteoarthritis)    OSA on CPAP    per study 01-30-2004  severe osa , AHI 51.6/hr   PAD (peripheral artery disease) ([place])    05-21-2006  left renal artery stenosis, s/p balloon angioplasty and stenting;  last duplex 02/ 2012  normal , arteries patent   S/P coronary artery stent placement    2000--  BMS x1  to OM;   2007-- DES x1  to OM proximal  to previous stent   Secondary hyperparathyroidism of renal origin [facility])    Urgency of urination    Wears glasses    Past Surgical History:  Procedure Laterality Date   CARDIOVASCULAR STRESS TEST  2010   per dr bensimhon epic note dated 04-18-2016  normal   CORONARY ANGIOPLASTY  1998   PTCA to [place]   PCI and BMS x1 OM   CORONARY ANGIOPLASTY WITH STENT PLACEMENT  12-18-2005  dr Claiborne Billings   DES x1 to proximal to previously placed stent in OM   CORONARY BALLOON  ANGIOPLASTY N/A 08/02/2020   Procedure: CORONARY BALLOON ANGIOPLASTY;  Surgeon: Troy Sine, MD;  Location: [facility];  Service: Cardiovascular;  Laterality: N/A;   CORONARY STENT INTERVENTION N/A 08/02/2020   Procedure: CORONARY STENT INTERVENTION;  Surgeon: Troy Sine, MD;  Location: [facility];  Service: Cardiovascular;  Laterality: N/A;   HERNIA REPAIR     INGUINAL HERNIA REPAIR Bilateral 10/30/2017   Procedure: LAPAROSCOPIC  BILATERAL INGUINAL HERNIA REPAIR  AND LEFT [place];  Surgeon: Michael Boston, MD;  Location: [facility];  Service: General;  Laterality: Bilateral;   INSERTION OF MESH Bilateral 10/30/2017   Procedure: INSERTION OF MESH;  Surgeon: Michael Boston, MD;  Location: [facility];  Service: General;  Laterality: Bilateral;   LEFT HEART CATH AND CORONARY ANGIOGRAPHY N/A 08/01/2020   Procedure: LEFT HEART CATH AND CORONARY ANGIOGRAPHY;  Surgeon: Troy Sine, MD;  Location: [facility];  Service: Cardiovascular;  Laterality: N/A;   RENAL ANGIOPLASTY Left 05-21-2006   dr berry   and stenting   TOTAL KNEE ARTHROPLASTY Left 07-22-2005   dr Shellia Carwin  [facility]   TRANSTHORACIC ECHOCARDIOGRAM  04/18/2016   ef 03-47%, grade 1 diastolic dysfunction/  mild to moderate AR/  mild MR    Allergies  Allergen Reactions   Lisinopril Swelling and Other (See Comments)    Angioedema    Losartan Potassium Swelling and Other (See Comments)    Angioedema    Crestor [Rosuvastatin] Swelling and Other (See Comments)    Angioedema    Tape Other (See Comments)    Irritates the skin    Outpatient Encounter Medications as of 04/11/2021  Medication Sig   ACCU-CHEK SOFTCLIX LANCETS lancets Use to test blood sugar daily. Dx:E11.8   Alcohol Swabs (B-D SINGLE USE SWABS REGULAR) PADS Use with testing of blood sugar. Dx:E11.8   allopurinol (ZYLOPRIM) 100 MG tablet Take 1 tablet (100 mg total) by mouth daily.   amLODipine  (NORVASC) 5 MG tablet Take 1 tablet (5 mg total) by mouth daily.   apixaban (ELIQUIS) 5 MG TABS tablet Take 1 tablet (5 mg total) by mouth 2 (two) times daily.   atorvastatin (LIPITOR) 80 MG tablet Take 1 tablet (80 mg total) by mouth daily.   citalopram (CELEXA) 20 MG tablet Take 1 tablet (20 mg total) by mouth daily.   clopidogrel (PLAVIX) 75 MG tablet Take 1 tablet (75 mg total) by mouth daily with breakfast.   dapagliflozin propanediol (FARXIGA) 5 MG TABS tablet Take 1 tablet (5 mg total) by mouth daily before breakfast.   ezetimibe (ZETIA) 10 MG tablet Take 10 mg by mouth daily.   furosemide (LASIX) 20 MG tablet TAKE 1 TABLET BY MOUTH EVERY DAY   glucose blood (ACCU-CHEK AVIVA PLUS) test strip Use to check blood sugar daily. Dx:E11.8   lidocaine (LIDODERM) 5 % Place  1 patch onto the skin daily. Remove & Discard patch within 12 hours or as directed by MD   nitroGLYCERIN (NITROSTAT) 0.4 MG SL tablet DISSOLVE 1 TABLET UNDER THE TONGUE EVERY 5 MINUTES AS  NEEDED FOR CHEST PAIN. MAX  OF 3 TABLETS IN 15 MINUTES. CALL 911 IF PAIN PERSISTS.   ULTRA-THIN II MINI PEN NEEDLE 31G X 5 MM MISC    No facility-administered encounter medications on file as of 04/11/2021.    Review of Systems  Constitutional:  Positive for chills. Negative for appetite change, fatigue, fever and unexpected weight change.  HENT:  Positive for hearing loss and rhinorrhea. Negative for congestion, dental problem, ear discharge, ear pain, facial swelling, nosebleeds, postnasal drip, sinus pressure, sinus pain, sneezing, sore throat, tinnitus and trouble swallowing.   Eyes:  Negative for pain, discharge, redness, itching and visual disturbance.  Respiratory:  Positive for cough. Negative for chest tightness and wheezing.        Shortness of breath with exertion   Cardiovascular:  Negative for chest pain, palpitations and leg swelling.  Gastrointestinal:  Negative for abdominal distention, abdominal pain, blood in stool,  constipation, diarrhea, nausea and vomiting.  Genitourinary:  Negative for difficulty urinating, dysuria, flank pain, frequency and urgency.  Musculoskeletal:  Negative for arthralgias, back pain, gait problem, joint swelling, myalgias, neck pain and neck stiffness.  Skin:  Negative for color change, pallor, rash and wound.  Neurological:  Negative for dizziness, syncope, speech difficulty, weakness, light-headedness, numbness and headaches.  Hematological:  Does not bruise/bleed easily.  Psychiatric/Behavioral:  Negative for agitation, behavioral problems, confusion, hallucinations, self-injury, sleep disturbance and suicidal ideas. The patient is not nervous/anxious.    Immunization History  Administered Date(s) Administered   Fluad Quad(high Dose 65+) 01/06/2019, 02/16/2020, 02/26/2021   Influenza, High Dose Seasonal PF 01/08/2017, 01/25/2018   Influenza,inj,Quad PF,6+ Mos 12/27/2012, 03/06/2014, 04/02/2015, 11/30/2015   PFIZER(Purple Top)SARS-COV-2 Vaccination 06/16/2019, 07/08/2019, 02/16/2020   Pneumococcal Conjugate-13 09/22/2014   Pneumococcal Polysaccharide-23 12/27/2012   Tdap 04/15/2011   Pertinent  Health Maintenance Due  Topic Date Due   OPHTHALMOLOGY EXAM  09/16/2019   HEMOGLOBIN A1C  12/14/2020   FOOT EXAM  02/15/2021   URINE MICROALBUMIN  02/15/2021   INFLUENZA VACCINE  Completed   Fall Risk 08/03/2020 08/07/2020 10/24/2020 02/26/2021 04/11/2021  Falls in the past year? - 0 0 0 0  Was there an injury with Fall? - 0 0 0 0  Fall Risk Category Calculator - 0 0 0 0  Fall Risk Category - Low Low Low Low  Patient Fall Risk Level High fall risk Low fall risk Low fall risk Low fall risk Low fall risk  Patient at Risk for Falls Due to - - No Fall Risks History of fall(s) -  Fall risk Follow up - - Falls evaluation completed;Education provided;Falls prevention discussed Falls evaluation completed;Education provided;Falls prevention discussed Falls evaluation completed    Functional Status Survey:    Vitals:   04/11/21 1054  BP: 100/90  Resp: 16  Temp: (!) 97.1 F (36.2 C)  Weight: 176 lb 3.2 oz (79.9 kg)  Height: _0  (1.702 m)   Body mass index is 27.6 kg/m. Physical Exam Vitals reviewed.  Constitutional:      General: He is not in acute distress.    Appearance: Normal appearance. He is normal weight. He is not ill-appearing or diaphoretic.  HENT:     Head: Normocephalic.     Right Ear: Tympanic membrane, ear canal and external ear normal. There  is no impacted cerumen.     Left Ear: Tympanic membrane, ear canal and external ear normal. There is no impacted cerumen.     Ears:     Comments: Bilateral hearing aids in place     Nose: Nose normal. No congestion or rhinorrhea.     Mouth/Throat:     Mouth: Mucous membranes are moist.     Pharynx: Oropharynx is clear. No oropharyngeal exudate or posterior oropharyngeal erythema.  Eyes:     General: No scleral icterus.       Right eye: No discharge.        Left eye: No discharge.     Extraocular Movements: Extraocular movements intact.     Conjunctiva/sclera: Conjunctivae normal.     Pupils: Pupils are equal, round, and reactive to light.  Cardiovascular:     Rate and Rhythm: Normal rate and regular rhythm.     Pulses: Normal pulses.     Heart sounds: Murmur heard.    No friction rub. No gallop.  Pulmonary:     Effort: Pulmonary effort is normal. No respiratory distress.     Breath sounds: Examination of the left-upper field reveals rales. Rales present. No wheezing or rhonchi.  Chest:     Chest wall: No tenderness.  Abdominal:     General: Bowel sounds are normal. There is no distension.     Palpations: Abdomen is soft. There is no mass.     Tenderness: There is no abdominal tenderness. There is no right CVA tenderness, left CVA tenderness, guarding or rebound.  Musculoskeletal:        General: No swelling or tenderness. Normal range of motion.     Cervical back: Normal range of  motion. No rigidity or tenderness.     Right lower leg: 2+ Edema present.     Left lower leg: 2+ Edema present.     Comments: Unsteady gait walks with Rolator   Lymphadenopathy:     Cervical: No cervical adenopathy.  Skin:    General: Skin is warm and dry.     Coloration: Skin is not pale.     Findings: No bruising, erythema, lesion or rash.  Neurological:     Mental Status: He is alert. Mental status is at baseline.     Cranial Nerves: No cranial nerve deficit.     Sensory: No sensory deficit.     Motor: No weakness.     Coordination: Coordination normal.     Gait: Gait abnormal.  Psychiatric:        Mood and Affect: Mood normal.        Speech: Speech normal.        Behavior: Behavior normal.        Thought Content: Thought content normal.        Judgment: Judgment normal.    Labs reviewed: Recent Labs    08/07/20 0000 10/29/20 1257 02/26/21 1000  NA 137 139 140  K 4.9 5.0 4.1  CL 107 104 110  CO2 17* 25 19*  GLUCOSE 80 68* 94  BUN _0 CREATININE 1.38* 1.37* 1.55*  CALCIUM 9.1 9.3 8.9   Recent Labs    10/29/20 1257 01/17/21 1023 02/04/21 1030  AST 21 44* 36  ALT 14 56* 41  ALKPHOS 78 151* 142*  BILITOT 0.6 0.7 0.7  PROT 8.3* 7.5 7.3  ALBUMIN 4.0 4.4 4.4   Recent Labs    07/27/20 1215 07/31/20 1540 08/01/20 0320 08/03/20 0148 08/07/20 0000 10/29/20  1257  WBC 13.5* 13.3*   < > 11.4* 8.2 7.4  NEUTROABS 12.0* 11.1*  --   --  5,838  --   HGB 13.0 13.8   < > 10.9* 10.5* 11.5*  HCT 39.8 42.3   < > 33.3* 33.0* 36.7*  MCV 94.8 94.6   < > 93.3 94.6 95.6  PLT 339 421*   < > 501* 465* 419*   < > = values in this interval not displayed.   Lab Results  Component Value Date   TSH 2.483 07/31/2020   Lab Results  Component Value Date   HGBA1C 5.3 06/13/2020   Lab Results  Component Value Date   CHOL 94 (L) 01/17/2021   HDL 44 01/17/2021   LDLCALC 36 01/17/2021   TRIG 60 01/17/2021   CHOLHDL 2.1 01/17/2021    Significant Diagnostic Results in  last 30 days:  No results found.  Assessment/Plan  1. Cough, unspecified type Afebrile Nonproductive cough  - POC Influenza A/B - SARS-COV-2 RNA,(COVID-19) QUAL NAAT - azithromycin (ZITHROMAX) 250 MG tablet; Take 2 tablets on day 1, then 1 tablet daily on days 2 through 5  Dispense: 6 tablet; Refill: 0  2. SOB (shortness of breath) SOB with exertion.bilateral 2+ edema noted. Advised to increase Furosemide 20 mg tablet to 2 tablet by mouth daily for 3 days  Then resume 20 mg tablet one by mouth daily  - Take along with Potassium chloride 20 meq tablet one by mouth daily. - POC Influenza A/B - SARS-COV-2 RNA,(COVID-19) QUAL NAAT - Notify provider or go to ED if symptoms worsen or fail to improve   3. Runny nose - POC Influenza A/B results negative  - SARS-COV-2 RNA,(COVID-19) QUAL NAAT  4. Chills (without fever) Afebrile.Was shaking upon entering exam room but had short sleeve T-shirt  but improved after wearing his jacket.  - POC Influenza A/B result negative  - SARS-COV-2 RNA,(COVID-19) QUAL NAAT  5. Edema of both lower extremities 2+ edema  - adjust Furosemide as above  - CBC with Differential/Platelet - BMP with eGFR([facility]) - potassium chloride SA (KLOR-CON M) 20 MEQ tablet; Take 1 tablet (20 mEq total) by mouth daily.  Dispense: 30 tablet; Refill: 0  6. Chronic congestive heart failure, unspecified heart failure type (HCC) Adjust furosemide as above  - CBC with Differential/Platelet - BMP with eGFR([facility]) - Brain Natriuretic Peptide - potassium chloride SA (KLOR-CON M) 20 MEQ tablet; Take 1 tablet (20 mEq total) by mouth daily.  Dispense: 30 tablet; Refill: 0   Family/ staff Communication: Reviewed plan of care with patient and son verbalized understanding   Labs/tests ordered:  - CBC with Differential/Platelet - BMP with eGFR([facility]) - Brain Natriuretic Peptide - POC Influenza A/B result negative  - SARS-COV-2 RNA,(COVID-19) QUAL NAAT  Next Appointment: As  needed if symptoms worsen or fail to improve    Sandrea Hughs, NP

## 2021-04-12 LAB — CBC WITH DIFFERENTIAL/PLATELET
Absolute Monocytes: 586 cells/uL (ref 200–950)
Basophils Absolute: 18 cells/uL (ref 0–200)
Basophils Relative: 0.3 %
Eosinophils Absolute: 12 cells/uL — ABNORMAL LOW (ref 15–500)
Eosinophils Relative: 0.2 %
HCT: 43.6 % (ref 38.5–50.0)
Hemoglobin: 14.4 g/dL (ref 13.2–17.1)
Lymphs Abs: 458 cells/uL — ABNORMAL LOW (ref 850–3900)
MCH: 30.5 pg (ref 27.0–33.0)
MCHC: 33 g/dL (ref 32.0–36.0)
MCV: 92.4 fL (ref 80.0–100.0)
MPV: 11.7 fL (ref 7.5–12.5)
Monocytes Relative: 9.6 %
Neutro Abs: 5026 cells/uL (ref 1500–7800)
Neutrophils Relative %: 82.4 %
Platelets: 197 10*3/uL (ref 140–400)
RBC: 4.72 10*6/uL (ref 4.20–5.80)
RDW: 15.8 % — ABNORMAL HIGH (ref 11.0–15.0)
Total Lymphocyte: 7.5 %
WBC: 6.1 10*3/uL (ref 3.8–10.8)

## 2021-04-12 LAB — BASIC METABOLIC PANEL WITH GFR
BUN/Creatinine Ratio: 17 (calc) (ref 6–22)
BUN: 34 mg/dL — ABNORMAL HIGH (ref 7–25)
CO2: 18 mmol/L — ABNORMAL LOW (ref 20–32)
Calcium: 9.5 mg/dL (ref 8.6–10.3)
Chloride: 105 mmol/L (ref 98–110)
Creat: 2.04 mg/dL — ABNORMAL HIGH (ref 0.70–1.22)
Glucose, Bld: 76 mg/dL (ref 65–99)
Potassium: 4.9 mmol/L (ref 3.5–5.3)
Sodium: 139 mmol/L (ref 135–146)
eGFR: 31 mL/min/{1.73_m2} — ABNORMAL LOW (ref >=60)

## 2021-04-12 LAB — SARS-COV-2 RNA,(COVID-19) QUALITATIVE NAAT: SARS CoV2 RNA: DETECTED — AB

## 2021-04-16 ENCOUNTER — Emergency Department (HOSPITAL_COMMUNITY): Payer: Medicare HMO

## 2021-04-16 ENCOUNTER — Telehealth: Payer: Self-pay

## 2021-04-16 ENCOUNTER — Inpatient Hospital Stay (HOSPITAL_COMMUNITY)
Admission: EM | Admit: 2021-04-16 | Discharge: 2021-04-28 | DRG: 871 | Disposition: A | Discharge status: Skilled Nursing Facility | Payer: Medicare HMO | Attending: Internal Medicine | Admitting: Internal Medicine

## 2021-04-16 DIAGNOSIS — J189 Pneumonia, unspecified organism: Secondary | ICD-10-CM | POA: Diagnosis present

## 2021-04-16 DIAGNOSIS — M19011 Primary osteoarthritis, right shoulder: Secondary | ICD-10-CM | POA: Diagnosis present

## 2021-04-16 DIAGNOSIS — R2689 Other abnormalities of gait and mobility: Secondary | ICD-10-CM | POA: Diagnosis not present

## 2021-04-16 DIAGNOSIS — D631 Anemia in chronic kidney disease: Secondary | ICD-10-CM | POA: Diagnosis present

## 2021-04-16 DIAGNOSIS — N400 Enlarged prostate without lower urinary tract symptoms: Secondary | ICD-10-CM | POA: Diagnosis present

## 2021-04-16 DIAGNOSIS — Z7902 Long term (current) use of antithrombotics/antiplatelets: Secondary | ICD-10-CM

## 2021-04-16 DIAGNOSIS — M6258 Muscle wasting and atrophy, not elsewhere classified, other site: Secondary | ICD-10-CM | POA: Diagnosis not present

## 2021-04-16 DIAGNOSIS — I4892 Unspecified atrial flutter: Secondary | ICD-10-CM | POA: Diagnosis present

## 2021-04-16 DIAGNOSIS — N1831 Chronic kidney disease, stage 3a: Secondary | ICD-10-CM | POA: Diagnosis present

## 2021-04-16 DIAGNOSIS — R0602 Shortness of breath: Secondary | ICD-10-CM | POA: Diagnosis not present

## 2021-04-16 DIAGNOSIS — M19012 Primary osteoarthritis, left shoulder: Secondary | ICD-10-CM | POA: Diagnosis present

## 2021-04-16 DIAGNOSIS — E1122 Type 2 diabetes mellitus with diabetic chronic kidney disease: Secondary | ICD-10-CM | POA: Diagnosis present

## 2021-04-16 DIAGNOSIS — I5021 Acute systolic (congestive) heart failure: Secondary | ICD-10-CM | POA: Diagnosis not present

## 2021-04-16 DIAGNOSIS — R0902 Hypoxemia: Secondary | ICD-10-CM | POA: Diagnosis not present

## 2021-04-16 DIAGNOSIS — R404 Transient alteration of awareness: Secondary | ICD-10-CM | POA: Diagnosis not present

## 2021-04-16 DIAGNOSIS — R0689 Other abnormalities of breathing: Secondary | ICD-10-CM | POA: Diagnosis not present

## 2021-04-16 DIAGNOSIS — D649 Anemia, unspecified: Secondary | ICD-10-CM | POA: Diagnosis not present

## 2021-04-16 DIAGNOSIS — N2581 Secondary hyperparathyroidism of renal origin: Secondary | ICD-10-CM | POA: Diagnosis present

## 2021-04-16 DIAGNOSIS — I130 Hypertensive heart and chronic kidney disease with heart failure and stage 1 through stage 4 chronic kidney disease, or unspecified chronic kidney disease: Secondary | ICD-10-CM | POA: Diagnosis not present

## 2021-04-16 DIAGNOSIS — F17210 Nicotine dependence, cigarettes, uncomplicated: Secondary | ICD-10-CM | POA: Diagnosis present

## 2021-04-16 DIAGNOSIS — T380X5A Adverse effect of glucocorticoids and synthetic analogues, initial encounter: Secondary | ICD-10-CM | POA: Diagnosis present

## 2021-04-16 DIAGNOSIS — E861 Hypovolemia: Secondary | ICD-10-CM | POA: Diagnosis not present

## 2021-04-16 DIAGNOSIS — M6281 Muscle weakness (generalized): Secondary | ICD-10-CM | POA: Diagnosis not present

## 2021-04-16 DIAGNOSIS — Z79899 Other long term (current) drug therapy: Secondary | ICD-10-CM

## 2021-04-16 DIAGNOSIS — I517 Cardiomegaly: Secondary | ICD-10-CM | POA: Diagnosis not present

## 2021-04-16 DIAGNOSIS — D472 Monoclonal gammopathy: Secondary | ICD-10-CM | POA: Diagnosis present

## 2021-04-16 DIAGNOSIS — J9601 Acute respiratory failure with hypoxia: Secondary | ICD-10-CM | POA: Diagnosis present

## 2021-04-16 DIAGNOSIS — I509 Heart failure, unspecified: Secondary | ICD-10-CM | POA: Diagnosis not present

## 2021-04-16 DIAGNOSIS — A419 Sepsis, unspecified organism: Secondary | ICD-10-CM | POA: Diagnosis not present

## 2021-04-16 DIAGNOSIS — Z96652 Presence of left artificial knee joint: Secondary | ICD-10-CM | POA: Diagnosis present

## 2021-04-16 DIAGNOSIS — Z8249 Family history of ischemic heart disease and other diseases of the circulatory system: Secondary | ICD-10-CM

## 2021-04-16 DIAGNOSIS — G9340 Encephalopathy, unspecified: Secondary | ICD-10-CM | POA: Diagnosis not present

## 2021-04-16 DIAGNOSIS — I083 Combined rheumatic disorders of mitral, aortic and tricuspid valves: Secondary | ICD-10-CM | POA: Diagnosis present

## 2021-04-16 DIAGNOSIS — I459 Conduction disorder, unspecified: Secondary | ICD-10-CM | POA: Diagnosis present

## 2021-04-16 DIAGNOSIS — E871 Hypo-osmolality and hyponatremia: Secondary | ICD-10-CM | POA: Diagnosis present

## 2021-04-16 DIAGNOSIS — Z888 Allergy status to other drugs, medicaments and biological substances status: Secondary | ICD-10-CM

## 2021-04-16 DIAGNOSIS — E872 Acidosis, unspecified: Secondary | ICD-10-CM | POA: Diagnosis not present

## 2021-04-16 DIAGNOSIS — R1312 Dysphagia, oropharyngeal phase: Secondary | ICD-10-CM | POA: Diagnosis not present

## 2021-04-16 DIAGNOSIS — I5043 Acute on chronic combined systolic (congestive) and diastolic (congestive) heart failure: Secondary | ICD-10-CM | POA: Diagnosis not present

## 2021-04-16 DIAGNOSIS — N170 Acute kidney failure with tubular necrosis: Secondary | ICD-10-CM | POA: Diagnosis present

## 2021-04-16 DIAGNOSIS — Z7984 Long term (current) use of oral hypoglycemic drugs: Secondary | ICD-10-CM

## 2021-04-16 DIAGNOSIS — E1151 Type 2 diabetes mellitus with diabetic peripheral angiopathy without gangrene: Secondary | ICD-10-CM | POA: Diagnosis present

## 2021-04-16 DIAGNOSIS — Z9109 Other allergy status, other than to drugs and biological substances: Secondary | ICD-10-CM

## 2021-04-16 DIAGNOSIS — R410 Disorientation, unspecified: Secondary | ICD-10-CM

## 2021-04-16 DIAGNOSIS — I5020 Unspecified systolic (congestive) heart failure: Secondary | ICD-10-CM | POA: Diagnosis not present

## 2021-04-16 DIAGNOSIS — E875 Hyperkalemia: Secondary | ICD-10-CM | POA: Diagnosis present

## 2021-04-16 DIAGNOSIS — E785 Hyperlipidemia, unspecified: Secondary | ICD-10-CM | POA: Diagnosis present

## 2021-04-16 DIAGNOSIS — I2510 Atherosclerotic heart disease of native coronary artery without angina pectoris: Secondary | ICD-10-CM | POA: Diagnosis present

## 2021-04-16 DIAGNOSIS — G9341 Metabolic encephalopathy: Secondary | ICD-10-CM | POA: Diagnosis present

## 2021-04-16 DIAGNOSIS — M109 Gout, unspecified: Secondary | ICD-10-CM | POA: Diagnosis present

## 2021-04-16 DIAGNOSIS — E119 Type 2 diabetes mellitus without complications: Secondary | ICD-10-CM | POA: Diagnosis not present

## 2021-04-16 DIAGNOSIS — N189 Chronic kidney disease, unspecified: Secondary | ICD-10-CM

## 2021-04-16 DIAGNOSIS — I700 Atherosclerosis of aorta: Secondary | ICD-10-CM | POA: Diagnosis not present

## 2021-04-16 DIAGNOSIS — Z7901 Long term (current) use of anticoagulants: Secondary | ICD-10-CM

## 2021-04-16 DIAGNOSIS — R41841 Cognitive communication deficit: Secondary | ICD-10-CM | POA: Diagnosis not present

## 2021-04-16 DIAGNOSIS — R600 Localized edema: Secondary | ICD-10-CM

## 2021-04-16 DIAGNOSIS — M7989 Other specified soft tissue disorders: Secondary | ICD-10-CM | POA: Diagnosis not present

## 2021-04-16 DIAGNOSIS — R188 Other ascites: Secondary | ICD-10-CM | POA: Diagnosis not present

## 2021-04-16 DIAGNOSIS — I5023 Acute on chronic systolic (congestive) heart failure: Secondary | ICD-10-CM | POA: Diagnosis not present

## 2021-04-16 DIAGNOSIS — Z7401 Bed confinement status: Secondary | ICD-10-CM | POA: Diagnosis not present

## 2021-04-16 DIAGNOSIS — N179 Acute kidney failure, unspecified: Secondary | ICD-10-CM

## 2021-04-16 DIAGNOSIS — U071 COVID-19: Secondary | ICD-10-CM | POA: Diagnosis not present

## 2021-04-16 DIAGNOSIS — Z833 Family history of diabetes mellitus: Secondary | ICD-10-CM

## 2021-04-16 DIAGNOSIS — J1282 Pneumonia due to coronavirus disease 2019: Secondary | ICD-10-CM | POA: Diagnosis present

## 2021-04-16 DIAGNOSIS — G4733 Obstructive sleep apnea (adult) (pediatric): Secondary | ICD-10-CM | POA: Diagnosis present

## 2021-04-16 DIAGNOSIS — R262 Difficulty in walking, not elsewhere classified: Secondary | ICD-10-CM | POA: Diagnosis not present

## 2021-04-16 DIAGNOSIS — R000 Tachycardia, unspecified: Secondary | ICD-10-CM | POA: Diagnosis not present

## 2021-04-16 DIAGNOSIS — Z955 Presence of coronary angioplasty implant and graft: Secondary | ICD-10-CM

## 2021-04-16 DIAGNOSIS — M2550 Pain in unspecified joint: Secondary | ICD-10-CM | POA: Diagnosis not present

## 2021-04-16 LAB — I-STAT VENOUS BLOOD GAS, ED
Acid-base deficit: 4 mmol/L — ABNORMAL HIGH (ref 0.0–2.0)
Bicarbonate: 20.4 mmol/L (ref 20.0–28.0)
Calcium, Ion: 1.01 mmol/L — ABNORMAL LOW (ref 1.15–1.40)
HCT: 44 % (ref 39.0–52.0)
Hemoglobin: 15 g/dL (ref 13.0–17.0)
O2 Saturation: 79 %
Potassium: 5.1 mmol/L (ref 3.5–5.1)
Sodium: 136 mmol/L (ref 135–145)
TCO2: 21 mmol/L — ABNORMAL LOW (ref 22–32)
pCO2, Ven: 33.1 mmHg — ABNORMAL LOW (ref 44.0–60.0)
pH, Ven: 7.398 (ref 7.250–7.430)
pO2, Ven: 42 mmHg (ref 32.0–45.0)

## 2021-04-16 LAB — CBC WITH DIFFERENTIAL/PLATELET
Abs Immature Granulocytes: 0.13 10*3/uL — ABNORMAL HIGH (ref 0.00–0.07)
Basophils Absolute: 0 10*3/uL (ref 0.0–0.1)
Basophils Relative: 0 %
Eosinophils Absolute: 0 10*3/uL (ref 0.0–0.5)
Eosinophils Relative: 0 %
HCT: 46.4 % (ref 39.0–52.0)
Hemoglobin: 15.8 g/dL (ref 13.0–17.0)
Immature Granulocytes: 1 %
Lymphocytes Relative: 4 %
Lymphs Abs: 0.5 10*3/uL — ABNORMAL LOW (ref 0.7–4.0)
MCH: 30.7 pg (ref 26.0–34.0)
MCHC: 34.1 g/dL (ref 30.0–36.0)
MCV: 90.1 fL (ref 80.0–100.0)
Monocytes Absolute: 0.4 10*3/uL (ref 0.1–1.0)
Monocytes Relative: 3 %
Neutro Abs: 10.5 10*3/uL — ABNORMAL HIGH (ref 1.7–7.7)
Neutrophils Relative %: 92 %
Platelets: 225 10*3/uL (ref 150–400)
RBC: 5.15 MIL/uL (ref 4.22–5.81)
RDW: 18.1 % — ABNORMAL HIGH (ref 11.5–15.5)
WBC: 11.5 10*3/uL — ABNORMAL HIGH (ref 4.0–10.5)
nRBC: 0 % (ref 0.0–0.2)

## 2021-04-16 LAB — LACTIC ACID, PLASMA
Lactic Acid, Venous: 2.8 mmol/L (ref 0.5–1.9)
Lactic Acid, Venous: 3.2 mmol/L (ref 0.5–1.9)

## 2021-04-16 LAB — COMPREHENSIVE METABOLIC PANEL
ALT: 30 U/L (ref 0–44)
AST: 62 U/L — ABNORMAL HIGH (ref 15–41)
Albumin: 3.2 g/dL — ABNORMAL LOW (ref 3.5–5.0)
Alkaline Phosphatase: 83 U/L (ref 38–126)
Anion gap: 12 (ref 5–15)
BUN: 34 mg/dL — ABNORMAL HIGH (ref 8–23)
CO2: 18 mmol/L — ABNORMAL LOW (ref 22–32)
Calcium: 8.6 mg/dL — ABNORMAL LOW (ref 8.9–10.3)
Chloride: 104 mmol/L (ref 98–111)
Creatinine, Ser: 1.66 mg/dL — ABNORMAL HIGH (ref 0.61–1.24)
GFR, Estimated: 39 mL/min — ABNORMAL LOW (ref >60)
Glucose, Bld: 72 mg/dL (ref 70–99)
Potassium: 4.5 mmol/L (ref 3.5–5.1)
Sodium: 134 mmol/L — ABNORMAL LOW (ref 135–145)
Total Bilirubin: 2.4 mg/dL — ABNORMAL HIGH (ref 0.3–1.2)
Total Protein: 7.8 g/dL (ref 6.5–8.1)

## 2021-04-16 LAB — LACTATE DEHYDROGENASE: LDH: 250 U/L — ABNORMAL HIGH (ref 98–192)

## 2021-04-16 LAB — CBG MONITORING, ED
Glucose-Capillary: 62 mg/dL — ABNORMAL LOW (ref 70–99)
Glucose-Capillary: 76 mg/dL (ref 70–99)

## 2021-04-16 LAB — D-DIMER, QUANTITATIVE: D-Dimer, Quant: 4.46 ug/mL-FEU — ABNORMAL HIGH (ref 0.00–0.50)

## 2021-04-16 LAB — HEMOGLOBIN A1C
Hgb A1c MFr Bld: 6.2 % — ABNORMAL HIGH (ref 4.8–5.6)
Mean Plasma Glucose: 131.24 mg/dL

## 2021-04-16 LAB — MAGNESIUM: Magnesium: 2 mg/dL (ref 1.7–2.4)

## 2021-04-16 LAB — PROTIME-INR
INR: 1.1 (ref 0.8–1.2)
Prothrombin Time: 14.6 seconds (ref 11.4–15.2)

## 2021-04-16 LAB — FIBRINOGEN: Fibrinogen: 614 mg/dL — ABNORMAL HIGH (ref 210–475)

## 2021-04-16 LAB — FERRITIN: Ferritin: 317 ng/mL (ref 24–336)

## 2021-04-16 LAB — PROCALCITONIN: Procalcitonin: 0.8 ng/mL

## 2021-04-16 LAB — RESP PANEL BY RT-PCR (FLU A&B, COVID) ARPGX2
Influenza A by PCR: NEGATIVE
Influenza B by PCR: NEGATIVE
SARS Coronavirus 2 by RT PCR: POSITIVE — AB

## 2021-04-16 MED ORDER — IPRATROPIUM-ALBUTEROL 20-100 MCG/ACT IN AERS
1.0000 | INHALATION_SPRAY | Freq: Four times a day (QID) | RESPIRATORY_TRACT | Status: DC
  Administered 2021-04-17: 1 via RESPIRATORY_TRACT
  Filled 2021-04-16: qty 4

## 2021-04-16 MED ORDER — LACTATED RINGERS IV BOLUS (SEPSIS): 1000.0000 mL | Freq: Once | INTRAVENOUS | Status: DC

## 2021-04-16 MED ORDER — SODIUM CHLORIDE 0.9 % IV SOLN
1.0000 g | Freq: Once | INTRAVENOUS | Status: AC
  Administered 2021-04-16: 1 g via INTRAVENOUS
  Filled 2021-04-16: qty 10

## 2021-04-16 MED ORDER — EZETIMIBE 10 MG PO TABS
10.0000 mg | ORAL_TABLET | Freq: Every day | ORAL | Status: DC
  Administered 2021-04-18 – 2021-04-28 (×11): 10 mg via ORAL
  Filled 2021-04-16 (×12): qty 1

## 2021-04-16 MED ORDER — LACTATED RINGERS IV BOLUS (SEPSIS)
500.0000 mL | Freq: Once | INTRAVENOUS | Status: AC
  Administered 2021-04-16: 500 mL via INTRAVENOUS

## 2021-04-16 MED ORDER — ONDANSETRON HCL 4 MG/2ML IJ SOLN: 4.0000 mg | Freq: Four times a day (QID) | INTRAMUSCULAR | Status: DC | PRN

## 2021-04-16 MED ORDER — ATORVASTATIN CALCIUM 80 MG PO TABS
80.0000 mg | ORAL_TABLET | Freq: Every day | ORAL | Status: DC
  Administered 2021-04-17 – 2021-04-28 (×12): 80 mg via ORAL
  Filled 2021-04-16 (×3): qty 1
  Filled 2021-04-16: qty 2
  Filled 2021-04-16 (×8): qty 1

## 2021-04-16 MED ORDER — ACETAMINOPHEN 325 MG PO TABS
650.0000 mg | ORAL_TABLET | ORAL | Status: DC | PRN
  Administered 2021-04-17 – 2021-04-23 (×3): 650 mg via ORAL
  Filled 2021-04-16 (×3): qty 2

## 2021-04-16 MED ORDER — FUROSEMIDE 10 MG/ML IJ SOLN: 20.0000 mg | Freq: Two times a day (BID) | INTRAMUSCULAR | Status: DC

## 2021-04-16 MED ORDER — SODIUM CHLORIDE 0.9 % IV SOLN
100.0000 mg | Freq: Once | INTRAVENOUS | Status: AC
  Administered 2021-04-16: 100 mg via INTRAVENOUS
  Filled 2021-04-16: qty 100

## 2021-04-16 MED ORDER — SODIUM CHLORIDE 0.9 % IV SOLN: 500.0000 mg | Freq: Once | INTRAVENOUS | Status: DC

## 2021-04-16 MED ORDER — APIXABAN 5 MG PO TABS
5.0000 mg | ORAL_TABLET | Freq: Two times a day (BID) | ORAL | Status: DC
  Administered 2021-04-16 – 2021-04-19 (×6): 5 mg via ORAL
  Filled 2021-04-16 (×6): qty 1

## 2021-04-16 MED ORDER — DEXAMETHASONE SODIUM PHOSPHATE 10 MG/ML IJ SOLN
6.0000 mg | INTRAMUSCULAR | Status: AC
  Administered 2021-04-16 – 2021-04-25 (×10): 6 mg via INTRAVENOUS
  Filled 2021-04-16 (×10): qty 1

## 2021-04-16 MED ORDER — SODIUM CHLORIDE 0.9 % IV SOLN: 250.0000 mL | INTRAVENOUS | Status: DC | PRN

## 2021-04-16 MED ORDER — SODIUM CHLORIDE 0.9 % IV SOLN
200.0000 mg | Freq: Once | INTRAVENOUS | Status: AC
  Administered 2021-04-16: 200 mg via INTRAVENOUS
  Filled 2021-04-16: qty 40

## 2021-04-16 MED ORDER — SODIUM CHLORIDE 0.9 % IV SOLN
1.0000 g | INTRAVENOUS | Status: AC
  Administered 2021-04-17 – 2021-04-21 (×5): 1 g via INTRAVENOUS
  Filled 2021-04-16 (×6): qty 10

## 2021-04-16 MED ORDER — INSULIN ASPART 100 UNIT/ML IJ SOLN
0.0000 [IU] | Freq: Three times a day (TID) | INTRAMUSCULAR | Status: DC
  Administered 2021-04-17 (×2): 1 [IU] via SUBCUTANEOUS
  Administered 2021-04-18: 3 [IU] via SUBCUTANEOUS
  Administered 2021-04-18 – 2021-04-19 (×3): 1 [IU] via SUBCUTANEOUS
  Administered 2021-04-20: 2 [IU] via SUBCUTANEOUS
  Administered 2021-04-21 – 2021-04-24 (×5): 1 [IU] via SUBCUTANEOUS
  Administered 2021-04-24: 2 [IU] via SUBCUTANEOUS
  Administered 2021-04-25: 1 [IU] via SUBCUTANEOUS
  Administered 2021-04-25: 2 [IU] via SUBCUTANEOUS
  Administered 2021-04-26 (×2): 1 [IU] via SUBCUTANEOUS

## 2021-04-16 MED ORDER — SODIUM CHLORIDE 0.9 % IV SOLN
100.0000 mg | Freq: Every day | INTRAVENOUS | Status: AC
  Administered 2021-04-17 – 2021-04-20 (×4): 100 mg via INTRAVENOUS
  Filled 2021-04-16 (×4): qty 20

## 2021-04-16 MED ORDER — CITALOPRAM HYDROBROMIDE 20 MG PO TABS
20.0000 mg | ORAL_TABLET | Freq: Every day | ORAL | Status: DC
  Administered 2021-04-17 – 2021-04-28 (×12): 20 mg via ORAL
  Filled 2021-04-16 (×11): qty 1
  Filled 2021-04-16: qty 2

## 2021-04-16 MED ORDER — SODIUM CHLORIDE 0.9% FLUSH
3.0000 mL | Freq: Two times a day (BID) | INTRAVENOUS | Status: DC
  Administered 2021-04-17 – 2021-04-28 (×21): 3 mL via INTRAVENOUS

## 2021-04-16 MED ORDER — CLOPIDOGREL BISULFATE 75 MG PO TABS
75.0000 mg | ORAL_TABLET | Freq: Every day | ORAL | Status: DC
  Administered 2021-04-17 – 2021-04-28 (×12): 75 mg via ORAL
  Filled 2021-04-16 (×12): qty 1

## 2021-04-16 MED ORDER — GUAIFENESIN-DM 100-10 MG/5ML PO SYRP
10.0000 mL | ORAL_SOLUTION | ORAL | Status: DC | PRN
  Administered 2021-04-17 – 2021-04-23 (×3): 10 mL via ORAL
  Filled 2021-04-16 (×3): qty 10

## 2021-04-16 MED ORDER — CARVEDILOL 3.125 MG PO TABS
3.1250 mg | ORAL_TABLET | Freq: Two times a day (BID) | ORAL | Status: DC
  Administered 2021-04-16 – 2021-04-17 (×3): 3.125 mg via ORAL
  Filled 2021-04-16 (×3): qty 1

## 2021-04-16 MED ORDER — DAPAGLIFLOZIN PROPANEDIOL 5 MG PO TABS
5.0000 mg | ORAL_TABLET | Freq: Every day | ORAL | Status: DC
  Administered 2021-04-17: 5 mg via ORAL
  Filled 2021-04-16: qty 1

## 2021-04-16 MED ORDER — SODIUM CHLORIDE 0.9% FLUSH: 3.0000 mL | INTRAVENOUS | Status: DC | PRN

## 2021-04-16 MED ORDER — ALLOPURINOL 100 MG PO TABS
100.0000 mg | ORAL_TABLET | Freq: Every day | ORAL | Status: DC
  Administered 2021-04-17 – 2021-04-28 (×12): 100 mg via ORAL
  Filled 2021-04-16 (×13): qty 1

## 2021-04-16 MED ORDER — POTASSIUM CHLORIDE CRYS ER 20 MEQ PO TBCR
20.0000 meq | EXTENDED_RELEASE_TABLET | Freq: Every day | ORAL | Status: DC
  Administered 2021-04-17: 20 meq via ORAL
  Filled 2021-04-16: qty 1

## 2021-04-16 NOTE — H&P (Addendum)
History and Physical    Kevin Schwartz ZOX:[id number] DOB: 27-Apr-1930 DOA: 04/16/2021  PCP: [name], MD (Confirm with patient/family/NH records and if not entered, this has to be entered at [facility] point of entry) Patient coming from: Home  I have personally briefly reviewed patient's old medical records in [place]  Chief Complaint: Cough, shortness of breath, leg swelling  HPI: Kevin Schwartz is a 88 y.o. male with medical history significant of CAD with stenting on Plavix and Eliquis regimen, chronic systolic CHF LVEF 81-19%, CKD stage IIIa, HTN, HLD, BPH, IIDM, presented with increasing shortness of breath and cough and leg swelling.  Symptoms started 4 days ago, initially with dry cough then became productive with yellowish sputum production, increasing shortness of breath, denies any chest pain no fever or chills.  His PCP started him on Lasix 20 mg daily for CHF in November, however in the last 3 days, patient noticed increasing leg swelling bilaterally.  On Saturday, he was tested positive for COVID, primary started on azithromycin.  This morning, patient family found patient was confused and called EMS.  ED Course: Borderline tachycardia, blood pressure within normal limits.  Chest x-ray showed bilateral interstitial pronation and left upper lobe consolidation.  CT chest showed left upper lobe consolidation and bilateral groundglass changes compatible with viral pneumonia.  Patient was started on ceftriaxone and remdesivir.  Review of Systems: As per HPI otherwise 14 point review of systems negative.    Past Medical History:  Diagnosis Date   Anemia due to chronic kidney disease    Bilateral inguinal hernia    CAD (coronary artery disease) cardiologist-  dr bensimhon   PTCA of OM in 1998, [place] in 2000, Cath 9/07 LM ok LAD ok. LCX 95% in OM prior to previous stent RCA. nondominant normal EF normal. [place] DES to OM 2007 (PLACED PROXIAMAL TO PREVIOUS  STENT)   Chronic kidney disease, stage III (moderate) ([place])    nephrologist-  dr detarding   Depression    Diabetes mellitus type 2, diet-controlled ([place])    followed by pcp,  last A1c 5.2 on 09-10-2017 in epic   Gout, unspecified    10-29-2017  per pt stable   HLD (hyperlipidemia)    HTN (hypertension)    MGUS (monoclonal gammopathy of unknown significance) previously followed by dr Beryle Beams , [facility] and released 08/01/2011   IgG kappa dx 2002 8% plasma cells in bone marrow; no lesions on bone X-rays;   Nocturia    OA (osteoarthritis)    OSA on CPAP    per study 01-30-2004  severe osa , AHI 51.6/hr   PAD (peripheral artery disease) ([place])    05-21-2006  left renal artery stenosis, s/p balloon angioplasty and stenting;  last duplex 02/ 2012  normal , arteries patent   S/P coronary artery stent placement    2000--  BMS x1  to OM;   2007-- DES x1  to OM proximal to previous stent   Secondary hyperparathyroidism of renal origin [facility])    Urgency of urination    Wears glasses     Past Surgical History:  Procedure Laterality Date   CARDIOVASCULAR STRESS TEST  2010   per dr bensimhon epic note dated 04-18-2016  normal   CORONARY ANGIOPLASTY  1998   PTCA to OM   CORONARY ANGIOPLASTY WITH STENT PLACEMENT  2000   PCI and BMS x1 OM   CORONARY ANGIOPLASTY WITH STENT PLACEMENT  12-18-2005  dr Claiborne Billings  DES x1 to proximal to previously placed stent in OM   CORONARY BALLOON ANGIOPLASTY N/A 08/02/2020   Procedure: CORONARY BALLOON ANGIOPLASTY;  Surgeon: Troy Sine, MD;  Location: [facility];  Service: Cardiovascular;  Laterality: N/A;   CORONARY STENT INTERVENTION N/A 08/02/2020   Procedure: CORONARY STENT INTERVENTION;  Surgeon: Troy Sine, MD;  Location: [facility];  Service: Cardiovascular;  Laterality: N/A;   HERNIA REPAIR     INGUINAL HERNIA REPAIR Bilateral 10/30/2017   Procedure: LAPAROSCOPIC  BILATERAL INGUINAL HERNIA REPAIR  AND LEFT [place];   Surgeon: Michael Boston, MD;  Location: [facility];  Service: General;  Laterality: Bilateral;   INSERTION OF MESH Bilateral 10/30/2017   Procedure: INSERTION OF MESH;  Surgeon: Michael Boston, MD;  Location: [facility];  Service: General;  Laterality: Bilateral;   LEFT HEART CATH AND CORONARY ANGIOGRAPHY N/A 08/01/2020   Procedure: LEFT HEART CATH AND CORONARY ANGIOGRAPHY;  Surgeon: Troy Sine, MD;  Location: [facility];  Service: Cardiovascular;  Laterality: N/A;   RENAL ANGIOPLASTY Left 05-21-2006   dr berry   and stenting   TOTAL KNEE ARTHROPLASTY Left 07-22-2005   dr Shellia Carwin  [facility]   TRANSTHORACIC ECHOCARDIOGRAM  04/18/2016   ef 62-83%, grade 1 diastolic dysfunction/  mild to moderate AR/  mild MR     reports that he has been smoking cigarettes. He has been smoking an average of .25 packs per day. He has never used smokeless tobacco. He reports that he does not currently use alcohol. He reports that he does not use drugs.  Allergies  Allergen Reactions   Lisinopril Swelling and Other (See Comments)    Angioedema    Losartan Potassium Swelling and Other (See Comments)    Angioedema    Crestor [Rosuvastatin] Swelling and Other (See Comments)    Angioedema    Tape Other (See Comments)    Irritates the skin    Family History  Problem Relation Age of Onset   Heart disease Mother    Healthy Father    Heart disease Sister    Heart disease Sister    Cancer Sister        type unknown   Hypertension Sister    Diabetes Brother    Diabetes Brother    Diabetes Other    Coronary artery disease Other    Cancer Other      Prior to Admission medications   Medication Sig Start Date End Date Taking? Authorizing Provider  ACCU-CHEK SOFTCLIX LANCETS lancets Use to test blood sugar daily. Dx:E11.8 03/25/17   Reed, Tiffany L, DO  Alcohol Swabs (B-D SINGLE USE SWABS REGULAR) PADS Use with testing of blood sugar. Dx:E11.8 03/25/17   Reed, Tiffany  L, DO  allopurinol (ZYLOPRIM) 100 MG tablet Take 1 tablet (100 mg total) by mouth daily. 01/31/21   [name], MD  amLODipine (NORVASC) 5 MG tablet Take 1 tablet (5 mg total) by mouth daily. 09/05/19   Reed, Tiffany L, DO  apixaban (ELIQUIS) 5 MG TABS tablet Take 1 tablet (5 mg total) by mouth 2 (two) times daily. 10/24/20   Almyra Deforest, PA  atorvastatin (LIPITOR) 80 MG tablet Take 1 tablet (80 mg total) by mouth daily. 01/31/21 05/01/21  Ngetich, Nelda Bucks, NP  azithromycin (ZITHROMAX) 250 MG tablet Take 2 tablets on day 1, then 1 tablet daily on days 2 through 5 04/11/21 04/16/21  Ngetich, Nelda Bucks, NP  citalopram (CELEXA)  20 MG tablet Take 1 tablet (20 mg total) by mouth daily. 01/31/21   [name], MD  clopidogrel (PLAVIX) 75 MG tablet Take 1 tablet (75 mg total) by mouth daily with breakfast. 08/04/20 07/30/21  Kayleen Memos, DO  dapagliflozin propanediol (FARXIGA) 5 MG TABS tablet Take 1 tablet (5 mg total) by mouth daily before breakfast. 10/29/20   Bensimhon, Shaune Pascal, MD  ezetimibe (ZETIA) 10 MG tablet Take 10 mg by mouth daily.    [provider]  furosemide (LASIX) 20 MG tablet TAKE 1 TABLET BY MOUTH EVERY DAY 03/20/21   [name], MD  glucose blood (ACCU-CHEK AVIVA PLUS) test strip Use to check blood sugar daily. Dx:E11.8 09/05/19   Reed, Tiffany L, DO  lidocaine (LIDODERM) 5 % Place 1 patch onto the skin daily. Remove & Discard patch within 12 hours or as directed by MD 04/01/20   Maudie Flakes, MD  nitroGLYCERIN (NITROSTAT) 0.4 MG SL tablet DISSOLVE 1 TABLET UNDER THE TONGUE EVERY 5 MINUTES AS  NEEDED FOR CHEST PAIN. MAX  OF 3 TABLETS IN 15 MINUTES. CALL 911 IF PAIN PERSISTS. 11/08/20   [name], MD  potassium chloride SA (KLOR-CON M) 20 MEQ tablet Take 1 tablet (20 mEq total) by mouth daily. 04/11/21   Ngetich, Nelda Bucks, NP  ULTRA-THIN II MINI PEN NEEDLE 31G X 5 MM MISC  08/29/14   [provider]    Physical Exam: Vitals:   04/16/21 1411  04/16/21 1545 04/16/21 1700 04/16/21 1730  BP:  123/86 119/78 119/68  Pulse:  (!) 103  100  Resp:  20 (!) 21 19  Temp: 100.3 F (37.9 C)     TempSrc: Rectal     SpO2:  99%  95%    Constitutional: NAD, calm, comfortable Vitals:   04/16/21 1411 04/16/21 1545 04/16/21 1700 04/16/21 1730  BP:  123/86 119/78 119/68  Pulse:  (!) 103  100  Resp:  20 (!) 21 19  Temp: 100.3 F (37.9 C)     TempSrc: Rectal     SpO2:  99%  95%   Eyes: PERRL, lids and conjunctivae normal ENMT: Mucous membranes are moist. Posterior pharynx clear of any exudate or lesions.Normal dentition.  Neck: normal, supple, no masses, no thyromegaly Respiratory: clear to auscultation bilaterally, no wheezing, bilateral crackles left> right.  Increasing respiratory effort. No accessory muscle use.  Cardiovascular: Regular rate and rhythm, no murmurs / rubs / gallops. 2+ extremity edema. 2+ pedal pulses. No carotid bruits.  Abdomen: no tenderness, no masses palpated. No hepatosplenomegaly. Bowel sounds positive.  Musculoskeletal: no clubbing / cyanosis. No joint deformity upper and lower extremities. Good ROM, no contractures. Normal muscle tone.  Skin: no rashes, lesions, ulcers. No induration Neurologic: CN 2-12 grossly intact. Sensation intact, DTR normal. Strength 5/5 in all 4.  Psychiatric: Normal judgment and insight. Alert and oriented x 3. Normal mood.     Labs on Admission: I have personally reviewed following labs and imaging studies  CBC: Recent Labs  Lab 04/11/21 1127 04/16/21 1450  WBC 6.1 11.5*  NEUTROABS 5,026 10.5*  HGB 14.4 15.8  HCT 43.6 46.4  MCV 92.4 90.1  PLT 197 073   Basic Metabolic Panel: Recent Labs  Lab 04/11/21 1127 04/16/21 1450  NA 139 134*  K 4.9 4.5  CL 105 104  CO2 18* 18*  GLUCOSE 76 72  BUN 34* 34*  CREATININE 2.04* 1.66*  CALCIUM 9.5 8.6*  MG  --  2.0  GFR: Estimated Creatinine Clearance: 31.2 mL/min (A) (by C-G formula based on SCr of 1.66 mg/dL (H)). Liver  Function Tests: Recent Labs  Lab 04/16/21 1450  AST 62*  ALT 30  ALKPHOS 83  BILITOT 2.4*  PROT 7.8  ALBUMIN 3.2*   No results for input(s): LIPASE, AMYLASE in the last 168 hours. No results for input(s): AMMONIA in the last 168 hours. Coagulation Profile: Recent Labs  Lab 04/16/21 1450  INR 1.1   Cardiac Enzymes: No results for input(s): CKTOTAL, CKMB, CKMBINDEX, TROPONINI in the last 168 hours. BNP (last 3 results) No results for input(s): PROBNP in the last 8760 hours. HbA1C: No results for input(s): HGBA1C in the last 72 hours. CBG: No results for input(s): GLUCAP in the last 168 hours. Lipid Profile: No results for input(s): CHOL, HDL, LDLCALC, TRIG, CHOLHDL, LDLDIRECT in the last 72 hours. Thyroid Function Tests: No results for input(s): TSH, T4TOTAL, FREET4, T3FREE, THYROIDAB in the last 72 hours. Anemia Panel: No results for input(s): VITAMINB12, FOLATE, FERRITIN, TIBC, IRON, RETICCTPCT in the last 72 hours. Urine analysis:    Component Value Date/Time   COLORURINE YELLOW 07/31/2020 1743   APPEARANCEUR CLEAR 07/31/2020 1743   LABSPEC 1.018 07/31/2020 1743   PHURINE 5.0 07/31/2020 [place] 07/31/2020 1743   HGBUR SMALL (A) 07/31/2020 1743   BILIRUBINUR NEGATIVE 07/31/2020 1743   KETONESUR NEGATIVE 07/31/2020 1743   PROTEINUR >=300 (A) 07/31/2020 1743   NITRITE NEGATIVE 07/31/2020 1743   LEUKOCYTESUR NEGATIVE 07/31/2020 1743    Radiological Exams on Admission: CT CHEST WO CONTRAST  Result Date: 04/16/2021 CLINICAL DATA:  Confusion, COVID-19 positive, pneumonia EXAM: CT CHEST WITHOUT CONTRAST TECHNIQUE: Multidetector CT imaging of the chest was performed following the standard protocol without IV contrast. COMPARISON:  04/16/2021 FINDINGS: Cardiovascular: Unenhanced imaging of the heart demonstrates cardiomegaly without pericardial effusion. Normal caliber of the thoracic aorta. Evaluation of the vascular lumen is limited without IV contrast.  There is severe atherosclerosis of the coronary vasculature, with mild atherosclerosis of the thoracic aorta. Mediastinum/Nodes: Borderline enlarged mediastinal and hilar lymph nodes are seen, measuring up to 15 mm in the subcarinal region. Evaluation of the hilar structures is limited without IV contrast. Thyroid, trachea, and esophagus are unremarkable. Lungs/Pleura: There is multifocal bilateral ground-glass airspace disease, compatible with given history of COVID pneumonia. Wedge-shaped dense consolidation within the left upper lobe corresponds to opacity on chest x-ray. This area measures approximately 4.5 x 4.0 cm. Differential in this region would include focal pneumonia of uncertain etiology, infarct, or underlying neoplasm. No effusion or pneumothorax.  Central airways are patent. Upper Abdomen: Nodularity of the liver capsule likely reflect underlying cirrhosis. Upper abdominal ascites. No acute finding. Musculoskeletal: No acute or destructive bony lesions. Bilateral shoulder osteoarthritis left greater than right. Reconstructed images demonstrate no additional findings. IMPRESSION: 1. Multifocal bilateral ground-glass airspace disease compatible with given history of COVID-19. 2. Wedge-shaped dense consolidation within the left upper lobe, measuring 4.5 x 4.0 cm, corresponding to chest x-ray findings. While this could reflect focal dense pneumonia, pulmonary infarct or neoplasm could give a similar appearance. If pulmonary embolus is a clinical concern, CT pulmonary angiography could be considered. Otherwise, follow-up imaging after appropriate medical management is recommended to assess for resolution. 3. Likely reactive mediastinal and hilar adenopathy. 4. Cardiomegaly. 5. Nodular liver contour compatible cirrhosis. Upper abdominal ascites. 6. Aortic Atherosclerosis (ICD10-I70.0). Coronary artery atherosclerosis. Electronically Signed   By: Randa Ngo M.D.   On: 04/16/2021 16:45   DG Chest [facility]  1  View  Result Date: 04/16/2021 CLINICAL DATA:  Questionable sepsis, left-sided chest pain EXAM: PORTABLE CHEST 1 VIEW COMPARISON:  07/31/2020 FINDINGS: Cardiomegaly. Focal, heterogeneous, somewhat masslike opacity of the peripheral left midlung. Osseous structures are unremarkable. IMPRESSION: 1. Focal, heterogeneous, somewhat masslike opacity of the peripheral left midlung, which may reflect infection in the appropriate clinical setting, mass not excluded. Consider CT to further evaluate. At minimum, recommend follow-up radiographs in 6-8 weeks to ensure complete radiographic resolution. 2. Cardiomegaly. Electronically Signed   By: Delanna Ahmadi M.D.   On: 04/16/2021 14:35    EKG: Independently reviewed.  Sinus tachycardia, no acute ST changes  Assessment/Plan Principal Problem:   PNA (pneumonia) Active Problems:   Acute CHF (congestive heart failure) (HCC)  (please populate well all problems here in Problem List. (For example, if patient is on BP meds at home and you resume or decide to hold them, it is a problem that needs to be her. Same for CAD, COPD, HLD and so on)  Sepsis -Multifactorial from combined COVID-19 infection COVID-19 pneumonia and probably concurrent bacterial pneumonia left upper lobe. -Remdesivir, IV steroid breathing treatment -Continue ceftriaxone for bacterial pneumonia -Fluid resuscitation wise, full set of lactic acid 2.8, patient received a 500 mL normal saline bolus, given baseline systolic CHF, as well as patient blood pressure remained stable, will not start maintenance IV fluids, as patient also has symptoms and signs of fluid overload.  While waiting for the second set of lactic acid we will hold IV fluid. -Repeat CT chest in 4 to 6 weeks to document resolution of the pneumonia.  Metabolic encephalopathy -Resolved, mentation at baseline now, likely from sepsis and pneumonia.  Acute on chronic systolic CHF decompensation -Hold maintenance IV fluids explained  to -If blood pressure improves, will start IV Lasix tomorrow. -Monitor breathing status tonight, may need a brief BiPAP support if any signs of worsening fluid overload.  COVID-19 pneumonia -Remdesivir, IV steroid, COVID labs.  CAD -With stenting, continue Plavix and Eliquis.  CKD stage IIIa -Creatinine level stable, signs of fluid overload on physical exam and history, consider IV diuresis tomorrow if BP stabilized.  IIDM -Continue Farxiga, add sliding scale.  DVT prophylaxis: Eliquis Code Status: Full code Family Communication: None at bedside Disposition Plan: Expect more than 2 midnight hospital stay to treat sepsis, COVID-19 pneumonia and concurrent bacterial pneumonia and CHF decompensation. Consults called: None Admission status: Telemetry admission   Lequita Halt MD [facility] Pager [phone]  04/16/2021, 6:03 PM

## 2021-04-16 NOTE — Telephone Encounter (Signed)
I agree need ED evaluation.

## 2021-04-16 NOTE — ED Triage Notes (Signed)
Patient BIB [facility] from a hotel due to confusion that has been occurring intermittently for several weeks but is more severe today. Patient tested positive for COVID-19 on 04/12/2021. 20g saline lock in left AC from EMS. Patient arrives to ED wearing 2L O2 [place], does not wear O2 at baseline. EMS states they were unable to measure the patient's SpO2 but administered oxygen due to tachypnea.  EMS vitals RR 30 etCO2 25 144/76 HR 110

## 2021-04-16 NOTE — Telephone Encounter (Signed)
[name], MD  You 16 minutes ago (11:33 AM)   It seems that evaluation in the ED is appropriate     Above is Dr.Miller's response (copied and pasted).  Spoke with patients son and he plans to take patient to the emergency room today

## 2021-04-16 NOTE — Telephone Encounter (Signed)
After discussing recent labs (see lab results dated 04/12/21) with patients son, Kevin Schwartz went on to express some concerns that he has regarding his fathers current condition.  Patient has no appetite and unable to eat x several days, everything patient tries to eat goes right through him (diarrhea). Patient is experiencing extreme diarrhea, weakness "lifeless" and had a rapid decline in his health x 10 days.  Douglass offered to take his father to the emergency room and his father refused and stated " Let's just wait and see what happens."  Kevin Schwartz is calling to ask for advice and recommendations from [facility] or [place]   Please advise

## 2021-04-16 NOTE — ED Notes (Signed)
Pt placed on hospital bed

## 2021-04-16 NOTE — Discharge Planning (Signed)
°  Transition of Care [facility]) Screening Note   Patient Details  Name: Kamonte Mcmichen Date of Birth: May 29, 1930   Transition of [facility]) CM/SW Contact:    Fuller Mandril, RN Phone Number: 04/16/2021, 2:53 PM    Transition of Care [facility]) has reviewed patient and no TOC needs have been identified at this time. We will continue to monitor patient advancement through interdisciplinary progression rounds. If new patient transition needs arise, please place a TOC consult.

## 2021-04-16 NOTE — ED Notes (Signed)
Lactic Acid 2.8. Dr. Laverta Baltimore notified.

## 2021-04-16 NOTE — ED Provider Notes (Signed)
[facility] EMERGENCY DEPARTMENT Provider Note   CSN: [id number] Arrival date & time: 04/16/21  1350     History  Chief Complaint  Patient presents with   Altered Mental Status    Kevin Schwartz is a 88 y.o. male.  HPI Patient presents to the ED via EMS.  EMS was called out to the hotel room where the patient resides.  Call was placed by his son.  Patient's son was concerned about the patient's altered mental status.  Patient was reportedly found sitting in a chair, having soiled himself.  He does have a recent diagnosis of COVID-19, 4 days ago.  He did appear to be short of breath on scene.  Although he was not hypoxic, he was given supplemental oxygen for comfort with subsequent improvement in his tachypnea and work of breathing.  Patient has BLE edema at baseline.  Currently, patient denies any areas of pain or discomfort.  Medical history is notable for CAD, CKD, DM2, HLD, and HTN.    Home Medications Prior to Admission medications   Medication Sig Start Date End Date Taking? Authorizing Provider  ACCU-CHEK SOFTCLIX LANCETS lancets Use to test blood sugar daily. Dx:E11.8 03/25/17   Reed, Tiffany L, DO  Alcohol Swabs (B-D SINGLE USE SWABS REGULAR) PADS Use with testing of blood sugar. Dx:E11.8 03/25/17   Reed, Tiffany L, DO  allopurinol (ZYLOPRIM) 100 MG tablet Take 1 tablet (100 mg total) by mouth daily. 01/31/21   [name], MD  amLODipine (NORVASC) 5 MG tablet Take 1 tablet (5 mg total) by mouth daily. 09/05/19   Reed, Tiffany L, DO  apixaban (ELIQUIS) 5 MG TABS tablet Take 1 tablet (5 mg total) by mouth 2 (two) times daily. 10/24/20   Almyra Deforest, PA  atorvastatin (LIPITOR) 80 MG tablet Take 1 tablet (80 mg total) by mouth daily. 01/31/21 05/01/21  Ngetich, Dinah C, NP  azithromycin (ZITHROMAX) 250 MG tablet Take 2 tablets on day 1, then 1 tablet daily on days 2 through 5 Patient taking differently: Take 250 mg by mouth See admin instructions. Take 2  tablets on day 1, then 1 tablet daily on days 2 through 5. Start date: 04/11/21 04/11/21 04/16/21  Ngetich, Nelda Bucks, NP  citalopram (CELEXA) 20 MG tablet Take 1 tablet (20 mg total) by mouth daily. 01/31/21   [name], MD  clopidogrel (PLAVIX) 75 MG tablet Take 1 tablet (75 mg total) by mouth daily with breakfast. 08/04/20 07/30/21  Kayleen Memos, DO  dapagliflozin propanediol (FARXIGA) 5 MG TABS tablet Take 1 tablet (5 mg total) by mouth daily before breakfast. 10/29/20   Bensimhon, Shaune Pascal, MD  ezetimibe (ZETIA) 10 MG tablet Take 10 mg by mouth daily.    [provider]  furosemide (LASIX) 20 MG tablet TAKE 1 TABLET BY MOUTH EVERY DAY 03/20/21   [name], MD  glucose blood (ACCU-CHEK AVIVA PLUS) test strip Use to check blood sugar daily. Dx:E11.8 09/05/19   Reed, Tiffany L, DO  lidocaine (LIDODERM) 5 % Place 1 patch onto the skin daily. Remove & Discard patch within 12 hours or as directed by MD 04/01/20   Maudie Flakes, MD  nitroGLYCERIN (NITROSTAT) 0.4 MG SL tablet DISSOLVE 1 TABLET UNDER THE TONGUE EVERY 5 MINUTES AS  NEEDED FOR CHEST PAIN. MAX  OF 3 TABLETS IN 15 MINUTES. CALL 911 IF PAIN PERSISTS. 11/08/20   [name], MD  potassium chloride SA (KLOR-CON M) 20 MEQ tablet Take  1 tablet (20 mEq total) by mouth daily. 04/11/21   Ngetich, Nelda Bucks, NP  ULTRA-THIN II MINI PEN NEEDLE 31G X 5 MM MISC  08/29/14   [provider]      Allergies    Lisinopril, Losartan potassium, Crestor [rosuvastatin], and Tape    Review of Systems   Review of Systems  Unable to perform ROS: Mental status change   Physical Exam Updated Vital Signs BP 96/60 (BP Location: Right Arm)    Pulse 81    Temp (!) 97.3 F (36.3 C) (Oral)    Resp 17    SpO2 97%  Physical Exam Vitals and nursing note reviewed.  Constitutional:      General: He is not in acute distress.    Appearance: He is well-developed and normal weight. He is ill-appearing. He is not toxic-appearing.  HENT:      Head: Normocephalic and atraumatic.     Right Ear: External ear normal.     Left Ear: External ear normal.     Nose: Nose normal.     Mouth/Throat:     Mouth: Mucous membranes are moist.     Pharynx: Oropharynx is clear.  Eyes:     Extraocular Movements: Extraocular movements intact.     Conjunctiva/sclera: Conjunctivae normal.  Cardiovascular:     Rate and Rhythm: Regular rhythm. Tachycardia present.     Heart sounds: No murmur heard. Pulmonary:     Effort: Pulmonary effort is normal. Tachypnea present. No prolonged expiration or respiratory distress.     Breath sounds: Rhonchi and rales present. No wheezing.  Abdominal:     Palpations: Abdomen is soft.     Tenderness: There is no abdominal tenderness. There is no right CVA tenderness or left CVA tenderness.  Musculoskeletal:        General: No swelling, tenderness or deformity. Normal range of motion.     Cervical back: Normal range of motion and neck supple. No rigidity.     Right lower leg: Edema present.     Left lower leg: Edema present.  Skin:    General: Skin is warm and dry.     Coloration: Skin is not jaundiced.  Neurological:     Mental Status: He is alert.     GCS: GCS eye subscore is 4. GCS verbal subscore is 4. GCS motor subscore is 6.     Cranial Nerves: No facial asymmetry.     Sensory: No sensory deficit.     Motor: No weakness or abnormal muscle tone.  Psychiatric:        Mood and Affect: Mood normal.        Behavior: Behavior normal.    ED Results / Procedures / Treatments   Labs (all labs ordered are listed, but only abnormal results are displayed) Labs Reviewed  RESP PANEL BY RT-PCR (FLU A&B, COVID) ARPGX2 - Abnormal; Notable for the following components:      Result Value   SARS Coronavirus 2 by RT PCR POSITIVE (*)    All other components within normal limits  LACTIC ACID, PLASMA - Abnormal; Notable for the following components:   Lactic Acid, Venous 2.8 (*)    All other components within normal  limits  LACTIC ACID, PLASMA - Abnormal; Notable for the following components:   Lactic Acid, Venous 3.2 (*)    All other components within normal limits  COMPREHENSIVE METABOLIC PANEL - Abnormal; Notable for the following components:   Sodium 134 (*)    CO2  18 (*)    BUN 34 (*)    Creatinine, Ser 1.66 (*)    Calcium 8.6 (*)    Albumin 3.2 (*)    AST 62 (*)    Total Bilirubin 2.4 (*)    GFR, Estimated 39 (*)    All other components within normal limits  CBC WITH DIFFERENTIAL/PLATELET - Abnormal; Notable for the following components:   WBC 11.5 (*)    RDW 18.1 (*)    Neutro Abs 10.5 (*)    Lymphs Abs 0.5 (*)    Abs Immature Granulocytes 0.13 (*)    All other components within normal limits  HEMOGLOBIN A1C - Abnormal; Notable for the following components:   Hgb A1c MFr Bld 6.2 (*)    All other components within normal limits  LACTATE DEHYDROGENASE - Abnormal; Notable for the following components:   LDH 250 (*)    All other components within normal limits  I-STAT VENOUS BLOOD GAS, ED - Abnormal; Notable for the following components:   pCO2, Ven 33.1 (*)    TCO2 21 (*)    Acid-base deficit 4.0 (*)    Calcium, Ion 1.01 (*)    All other components within normal limits  CULTURE, BLOOD (ROUTINE X 2)  CULTURE, BLOOD (ROUTINE X 2)  EXPECTORATED SPUTUM ASSESSMENT W GRAM STAIN, RFLX TO RESP C  PROTIME-INR  MAGNESIUM  FERRITIN  PROCALCITONIN  URINALYSIS, ROUTINE W REFLEX MICROSCOPIC  BASIC METABOLIC PANEL  D-DIMER, QUANTITATIVE  FIBRINOGEN  C-REACTIVE PROTEIN  D-DIMER, QUANTITATIVE  FERRITIN  MAGNESIUM  PHOSPHORUS  BLOOD GAS, VENOUS    EKG EKG Interpretation  Date/Time:  Tuesday April 16 2021 15:40:50 EST Ventricular Rate:  101 PR Interval:  216 QRS Duration: 88 QT Interval:  384 QTC Calculation: 498 R Axis:   42 Text Interpretation: Sinus tachycardia Prolonged PR interval Borderline low voltage, extremity leads Probable anteroseptal infarct, old Confirmed by Godfrey Pick [phone]) on 04/16/2021 3:45:40 PM  Radiology CT CHEST WO CONTRAST  Result Date: 04/16/2021 CLINICAL DATA:  Confusion, COVID-19 positive, pneumonia EXAM: CT CHEST WITHOUT CONTRAST TECHNIQUE: Multidetector CT imaging of the chest was performed following the standard protocol without IV contrast. COMPARISON:  04/16/2021 FINDINGS: Cardiovascular: Unenhanced imaging of the heart demonstrates cardiomegaly without pericardial effusion. Normal caliber of the thoracic aorta. Evaluation of the vascular lumen is limited without IV contrast. There is severe atherosclerosis of the coronary vasculature, with mild atherosclerosis of the thoracic aorta. Mediastinum/Nodes: Borderline enlarged mediastinal and hilar lymph nodes are seen, measuring up to 15 mm in the subcarinal region. Evaluation of the hilar structures is limited without IV contrast. Thyroid, trachea, and esophagus are unremarkable. Lungs/Pleura: There is multifocal bilateral ground-glass airspace disease, compatible with given history of COVID pneumonia. Wedge-shaped dense consolidation within the left upper lobe corresponds to opacity on chest x-ray. This area measures approximately 4.5 x 4.0 cm. Differential in this region would include focal pneumonia of uncertain etiology, infarct, or underlying neoplasm. No effusion or pneumothorax.  Central airways are patent. Upper Abdomen: Nodularity of the liver capsule likely reflect underlying cirrhosis. Upper abdominal ascites. No acute finding. Musculoskeletal: No acute or destructive bony lesions. Bilateral shoulder osteoarthritis left greater than right. Reconstructed images demonstrate no additional findings. IMPRESSION: 1. Multifocal bilateral ground-glass airspace disease compatible with given history of COVID-19. 2. Wedge-shaped dense consolidation within the left upper lobe, measuring 4.5 x 4.0 cm, corresponding to chest x-ray findings. While this could reflect focal dense pneumonia, pulmonary infarct or  neoplasm could give a similar appearance.  If pulmonary embolus is a clinical concern, CT pulmonary angiography could be considered. Otherwise, follow-up imaging after appropriate medical management is recommended to assess for resolution. 3. Likely reactive mediastinal and hilar adenopathy. 4. Cardiomegaly. 5. Nodular liver contour compatible cirrhosis. Upper abdominal ascites. 6. Aortic Atherosclerosis (ICD10-I70.0). Coronary artery atherosclerosis. Electronically Signed   By: Randa Ngo M.D.   On: 04/16/2021 16:45   DG Chest Port 1 View  Result Date: 04/16/2021 CLINICAL DATA:  Questionable sepsis, left-sided chest pain EXAM: PORTABLE CHEST 1 VIEW COMPARISON:  07/31/2020 FINDINGS: Cardiomegaly. Focal, heterogeneous, somewhat masslike opacity of the peripheral left midlung. Osseous structures are unremarkable. IMPRESSION: 1. Focal, heterogeneous, somewhat masslike opacity of the peripheral left midlung, which may reflect infection in the appropriate clinical setting, mass not excluded. Consider CT to further evaluate. At minimum, recommend follow-up radiographs in 6-8 weeks to ensure complete radiographic resolution. 2. Cardiomegaly. Electronically Signed   By: Delanna Ahmadi M.D.   On: 04/16/2021 14:35    Procedures Procedures    Medications Ordered in ED Medications  remdesivir 200 mg in sodium chloride 0.9% 250 mL IVPB (0 mg Intravenous Stopped 04/16/21 2039)    Followed by  remdesivir 100 mg in sodium chloride 0.9 % 100 mL IVPB (has no administration in time range)  allopurinol (ZYLOPRIM) tablet 100 mg (has no administration in time range)  atorvastatin (LIPITOR) tablet 80 mg (has no administration in time range)  ezetimibe (ZETIA) tablet 10 mg (has no administration in time range)  citalopram (CELEXA) tablet 20 mg (has no administration in time range)  dapagliflozin propanediol (FARXIGA) tablet 5 mg (has no administration in time range)  apixaban (ELIQUIS) tablet 5 mg (has no administration  in time range)  clopidogrel (PLAVIX) tablet 75 mg (has no administration in time range)  potassium chloride SA (KLOR-CON M) CR tablet 20 mEq (has no administration in time range)  sodium chloride flush (NS) 0.9 % injection 3 mL (has no administration in time range)  sodium chloride flush (NS) 0.9 % injection 3 mL (has no administration in time range)  0.9 %  sodium chloride infusion (has no administration in time range)  acetaminophen (TYLENOL) tablet 650 mg (has no administration in time range)  ondansetron (ZOFRAN) injection 4 mg (has no administration in time range)  carvedilol (COREG) tablet 3.125 mg (3.125 mg Oral Given 04/16/21 1907)  insulin aspart (novoLOG) injection 0-9 Units (has no administration in time range)  Ipratropium-Albuterol (COMBIVENT) respimat 1 puff (0 puffs Inhalation Hold 04/16/21 2039)  dexamethasone (DECADRON) injection 6 mg (6 mg Intravenous Given 04/16/21 1907)  guaiFENesin-dextromethorphan (ROBITUSSIN DM) 100-10 MG/5ML syrup 10 mL (has no administration in time range)  cefTRIAXone (ROCEPHIN) 1 g in sodium chloride 0.9 % 100 mL IVPB (has no administration in time range)  lactated ringers bolus 500 mL (0 mLs Intravenous Stopped 04/16/21 1543)  cefTRIAXone (ROCEPHIN) 1 g in sodium chloride 0.9 % 100 mL IVPB (0 g Intravenous Stopped 04/16/21 1659)  doxycycline (VIBRAMYCIN) 100 mg in sodium chloride 0.9 % 250 mL IVPB (0 mg Intravenous Stopped 04/16/21 1912)  lactated ringers bolus 500 mL (0 mLs Intravenous Stopped 04/16/21 2006)    ED Course/ Medical Decision Making/ A&P                           Medical Decision Making  Patient is 88 year old male with history of CAD, CKD, DM2, HLD, and HTN, recently diagnosed with COVID-19, presenting for altered mental status.  Initial history is  provided by EMS.  Patient is able to answer simple questions and follow commands, however, he is not currently able to provide further history.  On exam, patient has slightly increased work of  breathing.  Rhonchi and crackles are appreciated on lung auscultation.  He was able to maintain normal SPO2 on room air.  Initial vital signs are notable for mild tachycardia.  On check of rectal temperature, he did have a temperature of 100.3 degrees.  Gentle IV hydration was started.  Diagnostic work-up was initiated.  On x-ray, patient has a left midlung opacity, concerning for bacterial pneumonia.  CAP treatment was initiated.  Patient has reportedly been on a current course of azithromycin.  Ceftriaxone and doxycycline ordered in the ED.  On reassessment, patient is more alert.  He is able to participate in providing more history.  He does state that he has been taking his antibiotic.  Additionally, at this point, he is accompanied by his son at bedside.  Patient's son reports the following: Patient has been living in the hotel room for the past year.  Over this time, he has had a gradual physical decline.  At baseline, he is cognitively intact and fully alert and oriented.  Currently, he is not at his mental baseline.  Patient's son found his dad sitting in the chair in the hotel room.  He was not able to stand up from the chair.  At baseline, he is able to stand and ambulate.  Patient has been living in a hotel room by choice.  He has been offered placement in nursing facility but has declined that in the past.  Patient's son and daughter do feel that patient will need nursing facility placement after this.    Patient and son were updated on plan for antibiotics and admission.  Lab work showed baseline CKD, normal electrolytes, mild leukocytosis with neutrophilia, and a lactic acidosis of 2.8.  Patient was admitted to hospitalist for further management.  CRITICAL CARE Performed by: Godfrey Pick   Total critical care time: 35 minutes  Critical care time was exclusive of separately billable procedures and treating other patients.  Critical care was necessary to treat or prevent imminent or  life-threatening deterioration.  Critical care was time spent personally by me on the following activities: development of treatment plan with patient and/or surrogate as well as nursing, discussions with consultants, evaluation of patient's response to treatment, examination of patient, obtaining history from patient or surrogate, ordering and performing treatments and interventions, ordering and review of laboratory studies, ordering and review of radiographic studies, pulse oximetry and re-evaluation of patient's condition.         Final Clinical Impression(s) / ED Diagnoses Final diagnoses:  Disorientation  COVID-19  Lactic acidosis    Rx / DC Orders ED Discharge Orders     None         Godfrey Pick, MD 04/16/21 2131

## 2021-04-16 NOTE — ED Notes (Signed)
Pt bladder scanned with approx. 274ml shown on screen

## 2021-04-17 ENCOUNTER — Inpatient Hospital Stay (HOSPITAL_COMMUNITY): Payer: Medicare HMO

## 2021-04-17 DIAGNOSIS — N1831 Chronic kidney disease, stage 3a: Secondary | ICD-10-CM | POA: Diagnosis not present

## 2021-04-17 DIAGNOSIS — I5021 Acute systolic (congestive) heart failure: Secondary | ICD-10-CM | POA: Diagnosis not present

## 2021-04-17 DIAGNOSIS — J1282 Pneumonia due to coronavirus disease 2019: Secondary | ICD-10-CM

## 2021-04-17 DIAGNOSIS — M7989 Other specified soft tissue disorders: Secondary | ICD-10-CM | POA: Diagnosis not present

## 2021-04-17 DIAGNOSIS — U071 COVID-19: Secondary | ICD-10-CM | POA: Diagnosis not present

## 2021-04-17 LAB — BASIC METABOLIC PANEL
Anion gap: 11 (ref 5–15)
BUN: 37 mg/dL — ABNORMAL HIGH (ref 8–23)
CO2: 18 mmol/L — ABNORMAL LOW (ref 22–32)
Calcium: 7.7 mg/dL — ABNORMAL LOW (ref 8.9–10.3)
Chloride: 105 mmol/L (ref 98–111)
Creatinine, Ser: 1.65 mg/dL — ABNORMAL HIGH (ref 0.61–1.24)
GFR, Estimated: 40 mL/min — ABNORMAL LOW (ref >60)
Glucose, Bld: 105 mg/dL — ABNORMAL HIGH (ref 70–99)
Potassium: 4.6 mmol/L (ref 3.5–5.1)
Sodium: 134 mmol/L — ABNORMAL LOW (ref 135–145)

## 2021-04-17 LAB — URINALYSIS, ROUTINE W REFLEX MICROSCOPIC
Glucose, UA: NEGATIVE mg/dL
Ketones, ur: NEGATIVE mg/dL
Leukocytes,Ua: NEGATIVE
Nitrite: NEGATIVE
Protein, ur: >300 mg/dL — AB
Specific Gravity, Urine: >1.03 — ABNORMAL HIGH (ref 1.005–1.030)
pH: 5.5 (ref 5.0–8.0)

## 2021-04-17 LAB — URINALYSIS, MICROSCOPIC (REFLEX)

## 2021-04-17 LAB — CBG MONITORING, ED
Glucose-Capillary: 87 mg/dL (ref 70–99)
Glucose-Capillary: 136 mg/dL — ABNORMAL HIGH (ref 70–99)
Glucose-Capillary: 126 mg/dL — ABNORMAL HIGH (ref 70–99)

## 2021-04-17 LAB — FERRITIN: Ferritin: 286 ng/mL (ref 24–336)

## 2021-04-17 LAB — D-DIMER, QUANTITATIVE: D-Dimer, Quant: 3.44 ug/mL-FEU — ABNORMAL HIGH (ref 0.00–0.50)

## 2021-04-17 LAB — C-REACTIVE PROTEIN: CRP: 12.7 mg/dL — ABNORMAL HIGH (ref <1.0)

## 2021-04-17 LAB — PHOSPHORUS: Phosphorus: 3.5 mg/dL (ref 2.5–4.6)

## 2021-04-17 LAB — MAGNESIUM: Magnesium: 1.7 mg/dL (ref 1.7–2.4)

## 2021-04-17 LAB — GLUCOSE, CAPILLARY: Glucose-Capillary: 161 mg/dL — ABNORMAL HIGH (ref 70–99)

## 2021-04-17 MED ORDER — IPRATROPIUM-ALBUTEROL 20-100 MCG/ACT IN AERS
1.0000 | INHALATION_SPRAY | RESPIRATORY_TRACT | Status: DC
  Administered 2021-04-17 – 2021-04-19 (×9): 1 via RESPIRATORY_TRACT
  Filled 2021-04-17: qty 4

## 2021-04-17 MED ORDER — FUROSEMIDE 10 MG/ML IJ SOLN
20.0000 mg | Freq: Once | INTRAMUSCULAR | Status: AC
  Administered 2021-04-17: 20 mg via INTRAVENOUS
  Filled 2021-04-17: qty 2

## 2021-04-17 NOTE — Evaluation (Signed)
Physical Therapy Evaluation Patient Details Name: Kevin Schwartz MRN: [id number] DOB: 12-31-1930 Today's Date: 04/17/2021  History of Present Illness  Pt is an 88 y/o male admitted secondary to weakness and LE swelling. Pt also with covid PNA. PMH includes CAD, CHF, CKD, HTN, and DM.  Clinical Impression  Pt admitted secondary to problem above with deficits below. Pt requiring mod A for bed mobility and transfers this session. Attempted to take steps, however, pt with posterior LOB and unable to. Pt currently lives alone and is at increased risk for falls. Recommending SNF level therapies at d/c to increase independence and safety. Will continue to follow acutely.        Recommendations for follow up therapy are one component of a multi-disciplinary discharge planning process, led by the attending physician.  Recommendations may be updated based on patient status, additional functional criteria and insurance authorization.  Follow Up Recommendations Skilled nursing-short term rehab (<3 hours/day)    Assistance Recommended at Discharge Frequent or constant Supervision/Assistance  Patient can return home with the following  A lot of help with walking and/or transfers;A lot of help with bathing/dressing/bathroom    Equipment Recommendations Wheelchair (measurements PT);Wheelchair cushion (measurements PT)  Recommendations for Other Services       Functional Status Assessment Patient has had a recent decline in their functional status and demonstrates the ability to make significant improvements in function in a reasonable and predictable amount of time.     Precautions / Restrictions Precautions Precautions: Fall Restrictions Weight Bearing Restrictions: No      Mobility  Bed Mobility Overal bed mobility: Needs Assistance Bed Mobility: Supine to Sit;Sit to Supine     Supine to sit: Mod assist Sit to supine: Mod assist   General bed mobility comments: Mod A for trunk  assist and to scoot hips to EOB    Transfers Overall transfer level: Needs assistance Equipment used: 1 person hand held assist Transfers: Sit to/from Stand Sit to Stand: Mod assist           General transfer comment: Mod A for lift assist and steadying. Posterior lean in standing. Attempted to take side steps, however, pt unable and with posterior LOB requiring mod A to return to sitting.    Ambulation/Gait                  Stairs            Wheelchair Mobility    Modified Rankin (Stroke Patients Only)       Balance Overall balance assessment: Needs assistance Sitting-balance support: No upper extremity supported Sitting balance-[name]: Fair     Standing balance support: Single extremity supported Standing balance-[name]: Poor Standing balance comment: Reliant on UE and external support                             Pertinent Vitals/Pain Pain Assessment: Faces Faces Pain Scale: Hurts little more Pain Location: BLE Pain Descriptors / Indicators: Grimacing;Guarding Pain Intervention(s): Limited activity within patient's tolerance;Monitored during session;Repositioned    Home Living Family/patient expects to be discharged to:: Other (Comment) (motel) Living Arrangements: Alone Available Help at Discharge: Family;Available PRN/intermittently Type of Home: Other(Comment) (motel) Home Access: Level entry       Home Layout: One level Home Equipment: Cane - single point;BSC/3in1;Rollator (4 wheels)      Prior Function Prior Level of Function : Independent/Modified Independent  Mobility Comments: Uses rollator for mobility ADLs Comments: Reports increased difficulty, but does not require assist     Hand Dominance        Extremity/Trunk Assessment   Upper Extremity Assessment Upper Extremity Assessment: Defer to OT evaluation    Lower Extremity Assessment Lower Extremity Assessment: Generalized weakness  (increased swelling noted bilaterally)    Cervical / Trunk Assessment Cervical / Trunk Assessment: Kyphotic  Communication   Communication: HOH  Cognition Arousal/Alertness: Awake/alert Behavior During Therapy: WFL for tasks assessed/performed Overall Cognitive Status: No family/caregiver present to determine baseline cognitive functioning                                          General Comments      Exercises     Assessment/Plan    PT Assessment Patient needs continued PT services  PT Problem List Decreased strength;Decreased activity tolerance;Decreased balance;Decreased mobility;Decreased knowledge of use of DME;Decreased knowledge of precautions;Cardiopulmonary status limiting activity       PT Treatment Interventions DME instruction;Gait training;Stair training;Functional mobility training;Therapeutic activities;Therapeutic exercise;Balance training;Patient/family education    PT Goals (Current goals can be found in the Care Plan section)  Acute Rehab PT Goals Patient Stated Goal: to go home PT Goal Formulation: With patient Time For Goal Achievement: 05/01/21 Potential to Achieve Goals: Good    Frequency Min 3X/week     Co-evaluation               AM-PAC PT "6 Clicks" Mobility  Outcome Measure Help needed turning from your back to your side while in a flat bed without using bedrails?: A Lot Help needed moving from lying on your back to sitting on the side of a flat bed without using bedrails?: A Lot Help needed moving to and from a bed to a chair (including a wheelchair)?: A Lot Help needed standing up from a chair using your arms (e.g., wheelchair or bedside chair)?: A Lot Help needed to walk in hospital room?: Total Help needed climbing 3-5 steps with a railing? : Total 6 Click Score: 10    End of Session Equipment Utilized During Treatment: Gait belt Activity Tolerance: Patient tolerated treatment well Patient left: in bed;with  call bell/phone within reach (on bed in ED) Nurse Communication: Mobility status PT Visit Diagnosis: Other abnormalities of gait and mobility (R26.89);Unsteadiness on feet (R26.81);Muscle weakness (generalized) (M62.81);Difficulty in walking, not elsewhere classified (R26.2)    Time: 3875-6433 PT Time Calculation (min) (ACUTE ONLY): 20 min   Charges:   PT Evaluation $PT Eval Moderate Complexity: 1 Mod          Reuel Derby, PT, DPT  Acute Rehabilitation Services  Pager: [phone] Office: [phone]   Rudean Hitt 04/17/2021, 4:47 PM

## 2021-04-17 NOTE — Progress Notes (Signed)
Bilateral lower extremity venous duplex has been completed. Preliminary results can be found in CV Proc through chart review.   04/17/21 1:40 PM Carlos Levering RVT

## 2021-04-17 NOTE — ED Notes (Signed)
Sat patient up to eat his breakfast patient is resting with call bell in reach

## 2021-04-17 NOTE — ED Notes (Signed)
Message sent to Dr Maryland Pink and receiving RN-"A/Ox4, tired and sleeping a lot with the pneumonia, PT worked with him, he can pull self up in bed but is weak. Have been getting some low BP's 91-50 systolic last hour and sent message to Dr Maryland Pink about this. did get 105/62 after moving him around in bed but then are low. He is normally ambulatory." Message from Dr Maryland Pink to watch BP, no new orders received.

## 2021-04-17 NOTE — ED Notes (Signed)
PT at bedside.

## 2021-04-17 NOTE — Progress Notes (Addendum)
[facility] PROGRESS NOTE   Kevin Schwartz WUJ:[id number] DOB: [birth year] DOA: 04/16/2021  PCP: [name], MD  Brief History/Interval Summary: 88 y.o. male with medical history significant of CAD with stenting on Plavix and Eliquis regimen, chronic systolic CHF LVEF 95-62%, CKD stage IIIa, HTN, HLD, BPH, IIDM, presented with increasing shortness of breath and cough and leg swelling.  Patient tested positive for COVID-19.  Imaging studies showed left upper lobe consolidation and bilateral groundglass opacities.  Patient was hospitalized for further management.  Reason for Visit: Pneumonia due to COVID-19.  Acute systolic CHF  Consultants: None  Procedures: None    Subjective/Interval History: Patient denies any chest pain.  He mentions that he has been coughing.  Has had some sputum production which has been blood-tinged at times.  Denies any dizziness lightheadedness.  Has been feeling more fatigued compared to usual.  Has also noticed more leg swelling in the last few days.    Assessment/Plan:  Pneumonia due to COVID-19/sepsis, present on admission Patient had sepsis criteria on admission.  Noted to be tachypneic, tachycardic with elevated WBC.  He also had elevated lactic acid level. It is felt that his presentation was primarily due to COVID-19 with some concern for secondary bacterial infection.  Procalcitonin was 0.8. Patient is currently on Remdesivir, today is day 2 of 5.  Remains on ceftriaxone as well. Currently saturating in the mid 90s on room air.  Has mild hemoptysis which could be from pneumonia.  Patient does not have any symptoms or signs otherwise suggestive of thromboembolic process.  However his D-dimer is noted to be elevated which could be  be from COVID-19.  Since he does have lower extremity edema we will do Doppler study of the lower extremity. Continue with steroids.  Noted to be on dexamethasone.  CRP noted to be 12.7.  Acute on chronic  systolic CHF EF is noted to be 45 to 50%.  He does have significant lower extremity edema.  Was mild yesterday due to borderline low blood pressures.  Blood pressure remains low but he is asymptomatic.  We will give him low-dose diuretics. Will hold his carvedilol for now due to low blood pressures. Strict ins and outs and daily weights.  Acute metabolic encephalopathy Likely due to COVID-19.  Seems to be improving.  Close to baseline currently.  History of coronary artery disease Stable.  Continue Plavix.  Chronic kidney disease stage IIIa Baseline creatinine seems to be around 1.3-1.6.  Monitor renal function closely.  Monitor urine output.  Avoid nephrotoxic agents.  Diabetes mellitus type 2, with renal complications, with chronic kidney disease stage IIIa Low glucose levels noted.  Will hold [place].  HbA1c 6.2.  History of a flutter, unspecified Followed by cardiology.  On Eliquis for stroke prevention.   DVT Prophylaxis: On Eliquis Code Status: Full code Family Communication: No family at bedside.  Discussed with patient Disposition Plan: To be determined  Status is: Inpatient  Remains inpatient appropriate because: Acute on chronic systolic CHF, pneumonia due to COVID-19    Medications: Scheduled:  allopurinol  100 mg Oral Daily   apixaban  5 mg Oral BID   atorvastatin  80 mg Oral Daily   carvedilol  3.125 mg Oral BID WC   citalopram  20 mg Oral Daily   clopidogrel  75 mg Oral Q breakfast   dexamethasone (DECADRON) injection  6 mg Intravenous Q24H   ezetimibe  10 mg Oral Daily   insulin aspart  0-9 Units  Subcutaneous TID WC   Ipratropium-Albuterol  1 puff Inhalation Q4H   sodium chloride flush  3 mL Intravenous Q12H   Continuous:  sodium chloride     cefTRIAXone (ROCEPHIN)  IV     remdesivir 100 mg in NS 100 mL 100 mg (04/17/21 0906)   KGU:RKYHCW chloride, acetaminophen, guaiFENesin-dextromethorphan, ondansetron (ZOFRAN) IV, sodium chloride  flush  Antibiotics: Anti-infectives (From admission, onward)    Start     Dose/Rate Route Frequency Ordered Stop   04/17/21 1000  remdesivir 100 mg in sodium chloride 0.9 % 100 mL IVPB       See Hyperspace for full Linked Orders Report.   100 mg 200 mL/hr over 30 Minutes Intravenous Daily 04/16/21 1640 04/21/21 0959   04/17/21 1000  cefTRIAXone (ROCEPHIN) 1 g in sodium chloride 0.9 % 100 mL IVPB        1 g 200 mL/hr over 30 Minutes Intravenous Every 24 hours 04/16/21 1734     04/16/21 2000  remdesivir 200 mg in sodium chloride 0.9% 250 mL IVPB       See Hyperspace for full Linked Orders Report.   200 mg 580 mL/hr over 30 Minutes Intravenous Once 04/16/21 1640 04/16/21 2039   04/16/21 1500  doxycycline (VIBRAMYCIN) 100 mg in sodium chloride 0.9 % 250 mL IVPB        100 mg 125 mL/hr over 120 Minutes Intravenous  Once 04/16/21 1448 04/16/21 1912   04/16/21 1445  cefTRIAXone (ROCEPHIN) 1 g in sodium chloride 0.9 % 100 mL IVPB        1 g 200 mL/hr over 30 Minutes Intravenous  Once 04/16/21 1441 04/16/21 1659   04/16/21 1445  azithromycin (ZITHROMAX) 500 mg in sodium chloride 0.9 % 250 mL IVPB  Status:  Discontinued        500 mg 250 mL/hr over 60 Minutes Intravenous  Once 04/16/21 1441 04/16/21 1448       Objective:  Vital Signs  Vitals:   04/17/21 0615 04/17/21 0630 04/17/21 0645 04/17/21 0830  BP: (!) 87/70 (!) 106/57 (!) 102/56 112/65  Pulse: 86 79 80 81  Resp: 16 19 16 19   Temp:    97.9 F (36.6 C)  TempSrc:    Oral  SpO2: 91% 93% 94% 96%    Intake/Output Summary (Last 24 hours) at 04/17/2021 0919 Last data filed at 04/17/2021 0112 Gross per 24 hour  Intake --  Output 50 ml  Net -50 ml   There were no vitals filed for this visit.  General appearance: Awake alert.  In no distress Resp: Mildly tachypneic.  No use of accessory muscles.  Coarse breath sounds bilaterally with few crackles.  No wheezing or rhonchi. Cardio: S1-S2 is normal regular.  No S3-S4.  No rubs  murmurs or bruit GI: Abdomen is soft.  Nontender nondistended.  Bowel sounds are present normal.  No masses organomegaly Extremities: 2+ edema bilateral lower extremities Neurologic: Alert and oriented x3.  No focal neurological deficits.    Lab Results:  Data Reviewed: I have personally reviewed following labs and imaging studies  CBC: Recent Labs  Lab 04/11/21 1127 04/16/21 1450 04/16/21 2005  WBC 6.1 11.5*  --   NEUTROABS 5,026 10.5*  --   HGB 14.4 15.8 15.0  HCT 43.6 46.4 44.0  MCV 92.4 90.1  --   PLT 197 225  --     Basic Metabolic Panel: Recent Labs  Lab 04/11/21 1127 04/16/21 1450 04/16/21 2005 04/17/21 0459  NA 139 134*  136 134*  K 4.9 4.5 5.1 4.6  CL 105 104  --  105  CO2 18* 18*  --  18*  GLUCOSE 76 72  --  105*  BUN 34* 34*  --  37*  CREATININE 2.04* 1.66*  --  1.65*  CALCIUM 9.5 8.6*  --  7.7*  MG  --  2.0  --  1.7  PHOS  --   --   --  3.5    GFR: Estimated Creatinine Clearance: 31.3 mL/min (A) (by C-G formula based on SCr of 1.65 mg/dL (H)).  Liver Function Tests: Recent Labs  Lab 04/16/21 1450  AST 62*  ALT 30  ALKPHOS 83  BILITOT 2.4*  PROT 7.8  ALBUMIN 3.2*     Coagulation Profile: Recent Labs  Lab 04/16/21 1450  INR 1.1     HbA1C: Recent Labs    04/16/21 1957  HGBA1C 6.2*    CBG: Recent Labs  Lab 04/16/21 2152 04/16/21 2213 04/17/21 0907  GLUCAP 62* 76 87    Anemia Panel: Recent Labs    04/16/21 1957 04/17/21 0459  FERRITIN 317 286    Recent Results (from the past 240 hour(s))  SARS-COV-2 RNA,(COVID-19) QUAL NAAT     Status: Abnormal   Collection Time: 04/11/21 11:17 AM  Result Value Ref Range Status   SARS CoV2 RNA DETECTED (A) NOT DETECTED Final    Comment: . A Detected result indicates that the patient's specimen was positive for SARS-CoV-2 RNA. [name] Test Method: Nucleic Acid Amplification Test including reverse transcription polymerase chain reaction (RT-PCR) and transcription mediated  amplification (TMA). The test method meets the [place] Centers for Disease Control and prevention (CDC) pre departure and arrival requirement for viral test for COVID-19 dated May 12, 2019. Testing requirements for traveling may change with time. The patient is responsible for determining the test requirements for each nation while they are traveling. . This test has been authorized by the FDA under an  Emergency Use Authorization (EUA) for use by authorized laboratories. . Please review the "Fact Sheets" and FDA authorized labeling available for health care providers and patients using the following websites: https://www.[email or web address]/home/Covid-19/HCP/NAAT/fact-sheet2  https://www.[facility].com/home/Covid-19/Patients/NAAT / fact-sheet2 . Due to the current public health emergency, [facility] is accepting samples from appropriate clinical sources collected using wide variety of swabs and transport media for COVID-19. Not detected test results derived from specimens received in non- commercially manufactured viral collection kits or those not yet authorized by FDA for COVID-19 testing should be cautiously evaluated and take extra precautions such as additional clinical monitoring, including collection of an additional specimen. . Additional information about COVID-19 can be found at the [employer] website: www.[facility].com/Covid19. . For patients with a Detected or Inconclusive test result, please see CDC's COVID-19 Treatments and  Medications page located at  BroadcastLocal.cz- severe-illness.html for information on COVID-19 therapeutics. . For patients with a Not Detected test result, please see CDC's Vacci nes for COVID-19 page located at PhoneStatistics.is for information on COVID-19 vaccines.   Blood Culture (routine x 2)     Status: None  (Preliminary result)   Collection Time: 04/16/21  2:17 PM   Specimen: Left Antecubital; Blood  Result Value Ref Range Status   Specimen Description LEFT ANTECUBITAL  Final   Special Requests   Final    BOTTLES DRAWN AEROBIC AND ANAEROBIC Blood Culture results may not be optimal due to an inadequate volume of blood received in culture bottles   Culture   Final    NO  GROWTH < 24 HOURS Performed at [facility], [place] [address], [place], [place] [address]    Report Status PENDING  Incomplete  Resp Panel by RT-PCR (Flu A&B, Covid) Nasopharyngeal Swab     Status: Abnormal   Collection Time: 04/16/21  2:54 PM   Specimen: Nasopharyngeal Swab; Nasopharyngeal(NP) swabs in vial transport medium  Result Value Ref Range Status   SARS Coronavirus 2 by RT PCR POSITIVE (A) NEGATIVE Final    Comment: (NOTE) SARS-CoV-2 target nucleic acids are DETECTED.  The SARS-CoV-2 RNA is generally detectable in upper respiratory specimens during the acute phase of infection. Positive results are indicative of the presence of the identified virus, but do not rule out bacterial infection or co-infection with other pathogens not detected by the test. Clinical correlation with patient history and other diagnostic information is necessary to determine patient infection status. The expected result is Negative.  Fact Sheet for Patients: EntrepreneurPulse.com.au  Fact Sheet for Healthcare Providers: IncredibleEmployment.be  This test is not yet approved or cleared by the [place] FDA and  has been authorized for detection and/or diagnosis of SARS-CoV-2 by FDA under an Emergency Use Authorization (EUA).  This EUA will remain in effect (meaning this test can be used) for the duration of  the COVID-19 declaration under Section 564(b)(1) of the A ct, 21 U.S.C. section 360bbb-3(b)(1), unless the authorization is terminated or revoked sooner.     Influenza A by PCR  NEGATIVE NEGATIVE Final   Influenza B by PCR NEGATIVE NEGATIVE Final    Comment: (NOTE) The Xpert Xpress SARS-CoV-2/FLU/RSV plus assay is intended as an aid in the diagnosis of influenza from Nasopharyngeal swab specimens and should not be used as a sole basis for treatment. Nasal washings and aspirates are unacceptable for Xpert Xpress SARS-CoV-2/FLU/RSV testing.  Fact Sheet for Patients: EntrepreneurPulse.com.au  Fact Sheet for Healthcare Providers: IncredibleEmployment.be  This test is not yet approved or cleared by the [place] FDA and has been authorized for detection and/or diagnosis of SARS-CoV-2 by FDA under an Emergency Use Authorization (EUA). This EUA will remain in effect (meaning this test can be used) for the duration of the COVID-19 declaration under Section 564(b)(1) of the Act, 21 U.S.C. section 360bbb-3(b)(1), unless the authorization is terminated or revoked.  Performed at [facility], [place] [address], [place], [place] [address]       Radiology Studies: CT CHEST WO CONTRAST  Result Date: 04/16/2021 CLINICAL DATA:  Confusion, COVID-19 positive, pneumonia EXAM: CT CHEST WITHOUT CONTRAST TECHNIQUE: Multidetector CT imaging of the chest was performed following the standard protocol without IV contrast. COMPARISON:  04/16/2021 FINDINGS: Cardiovascular: Unenhanced imaging of the heart demonstrates cardiomegaly without pericardial effusion. Normal caliber of the thoracic aorta. Evaluation of the vascular lumen is limited without IV contrast. There is severe atherosclerosis of the coronary vasculature, with mild atherosclerosis of the thoracic aorta. Mediastinum/Nodes: Borderline enlarged mediastinal and hilar lymph nodes are seen, measuring up to 15 mm in the subcarinal region. Evaluation of the hilar structures is limited without IV contrast. Thyroid, trachea, and esophagus are unremarkable. Lungs/Pleura: There is multifocal  bilateral ground-glass airspace disease, compatible with given history of COVID pneumonia. Wedge-shaped dense consolidation within the left upper lobe corresponds to opacity on chest x-ray. This area measures approximately 4.5 x 4.0 cm. Differential in this region would include focal pneumonia of uncertain etiology, infarct, or underlying neoplasm. No effusion or pneumothorax.  Central airways are patent. Upper Abdomen: Nodularity of the liver capsule likely reflect  underlying cirrhosis. Upper abdominal ascites. No acute finding. Musculoskeletal: No acute or destructive bony lesions. Bilateral shoulder osteoarthritis left greater than right. Reconstructed images demonstrate no additional findings. IMPRESSION: 1. Multifocal bilateral ground-glass airspace disease compatible with given history of COVID-19. 2. Wedge-shaped dense consolidation within the left upper lobe, measuring 4.5 x 4.0 cm, corresponding to chest x-ray findings. While this could reflect focal dense pneumonia, pulmonary infarct or neoplasm could give a similar appearance. If pulmonary embolus is a clinical concern, CT pulmonary angiography could be considered. Otherwise, follow-up imaging after appropriate medical management is recommended to assess for resolution. 3. Likely reactive mediastinal and hilar adenopathy. 4. Cardiomegaly. 5. Nodular liver contour compatible cirrhosis. Upper abdominal ascites. 6. Aortic Atherosclerosis (ICD10-I70.0). Coronary artery atherosclerosis. Electronically Signed   By: Randa Ngo M.D.   On: 04/16/2021 16:45   DG CHEST PORT 1 VIEW  Result Date: 04/17/2021 CLINICAL DATA:  CHF. EXAM: PORTABLE CHEST 1 VIEW COMPARISON:  04/16/2021. FINDINGS: The heart is enlarged. The pulmonary vasculature is within normal limits. Atherosclerotic calcification of the aorta is noted. A focal opacity is present in the mid left lung, slightly increased from the prior exam. A few scattered air space opacities are noted bilaterally.  No effusion or pneumothorax. No acute osseous abnormality. IMPRESSION: 1. Focal opacity in the mid left lung and scattered airspace opacities bilaterally, slightly increased from the prior exam. 2. Cardiomegaly. Electronically Signed   By: Brett Fairy M.D.   On: 04/17/2021 04:48   DG Chest Port 1 View  Result Date: 04/16/2021 CLINICAL DATA:  Questionable sepsis, left-sided chest pain EXAM: PORTABLE CHEST 1 VIEW COMPARISON:  07/31/2020 FINDINGS: Cardiomegaly. Focal, heterogeneous, somewhat masslike opacity of the peripheral left midlung. Osseous structures are unremarkable. IMPRESSION: 1. Focal, heterogeneous, somewhat masslike opacity of the peripheral left midlung, which may reflect infection in the appropriate clinical setting, mass not excluded. Consider CT to further evaluate. At minimum, recommend follow-up radiographs in 6-8 weeks to ensure complete radiographic resolution. 2. Cardiomegaly. Electronically Signed   By: Delanna Ahmadi M.D.   On: 04/16/2021 14:35       LOS: 1 day   [facility] Pager on [email or web address]  04/17/2021, 9:19 AM

## 2021-04-18 ENCOUNTER — Inpatient Hospital Stay (HOSPITAL_COMMUNITY): Payer: Medicare HMO

## 2021-04-18 DIAGNOSIS — U071 COVID-19: Secondary | ICD-10-CM | POA: Diagnosis not present

## 2021-04-18 DIAGNOSIS — N179 Acute kidney failure, unspecified: Secondary | ICD-10-CM | POA: Diagnosis not present

## 2021-04-18 DIAGNOSIS — E1122 Type 2 diabetes mellitus with diabetic chronic kidney disease: Secondary | ICD-10-CM

## 2021-04-18 DIAGNOSIS — N1831 Chronic kidney disease, stage 3a: Secondary | ICD-10-CM | POA: Diagnosis not present

## 2021-04-18 LAB — BASIC METABOLIC PANEL
Anion gap: 7 (ref 5–15)
BUN: 47 mg/dL — ABNORMAL HIGH (ref 8–23)
CO2: 18 mmol/L — ABNORMAL LOW (ref 22–32)
Calcium: 7.6 mg/dL — ABNORMAL LOW (ref 8.9–10.3)
Chloride: 105 mmol/L (ref 98–111)
Creatinine, Ser: 1.94 mg/dL — ABNORMAL HIGH (ref 0.61–1.24)
GFR, Estimated: 33 mL/min — ABNORMAL LOW (ref >60)
Glucose, Bld: 205 mg/dL — ABNORMAL HIGH (ref 70–99)
Potassium: 5 mmol/L (ref 3.5–5.1)
Sodium: 130 mmol/L — ABNORMAL LOW (ref 135–145)

## 2021-04-18 LAB — GLUCOSE, CAPILLARY
Glucose-Capillary: 144 mg/dL — ABNORMAL HIGH (ref 70–99)
Glucose-Capillary: 97 mg/dL (ref 70–99)
Glucose-Capillary: 231 mg/dL — ABNORMAL HIGH (ref 70–99)
Glucose-Capillary: 91 mg/dL (ref 70–99)

## 2021-04-18 LAB — C-REACTIVE PROTEIN: CRP: 10.1 mg/dL — ABNORMAL HIGH (ref <1.0)

## 2021-04-18 MED ORDER — SODIUM CHLORIDE 0.9 % IV SOLN: INTRAVENOUS | Status: DC

## 2021-04-18 MED ORDER — SODIUM CHLORIDE 0.9 % IV BOLUS
500.0000 mL | Freq: Once | INTRAVENOUS | Status: AC
  Administered 2021-04-18: 500 mL via INTRAVENOUS

## 2021-04-18 NOTE — Progress Notes (Signed)
Heart Failure Navigator Progress Note  Assessed for Heart & Vascular TOC clinic readiness.  Patient does not meet criteria due to prior to hospitalization pt established with AHF clinic, pt last seen 10/2020 by Dr. Haroldine Laws.   Navigator available for educational resources.   Pricilla Holm, MSN, RN Heart Failure Nurse Navigator [phone]

## 2021-04-18 NOTE — TOC Initial Note (Addendum)
Transition of Care [facility]) - Initial/Assessment Note    Patient Details  Name: Kevin Schwartz MRN: [id number] Date of Birth: [birth year]  Transition of Care [facility]) CM/SW Contact:    Cyndi Bender, RN Phone Number: 04/18/2021, 4:16 PM  Clinical Narrative:                 Patient's son requested call regarding transition needs. I notified him that therapy is recommending SNF.  Patient is heart failure patient. HF navigator consult ordered. Patient doesn't weigh himself daily but does follow up with his cardiologist. TOC will continue to follow for discharge needs.    Expected Discharge Plan: Skilled Nursing Facility Barriers to Discharge: Continued Medical Work up   Patient Goals and CMS Choice        Expected Discharge Plan and Services Expected Discharge Plan: [place]                                              Prior Living Arrangements/Services   Lives with:: Self          Need for Family Participation in Patient Care: Yes (Comment) Care giver support system in place?: Yes (comment)   Criminal Activity/Legal Involvement Pertinent to Current Situation/Hospitalization: No - Comment as needed  Activities of Daily Living      Permission Sought/Granted                  Emotional Assessment         Alcohol / Substance Use: Not Applicable Psych Involvement: No (comment)  Admission diagnosis:  Lactic acidosis [E87.20] CHF (congestive heart failure) ([place]) [I50.9] Disorientation [R41.0] PNA (pneumonia) [J18.9] COVID-19 [U07.1] Patient Active Problem List   Diagnosis Date Noted   Acute CHF (congestive heart failure) ([name]) 04/16/2021   PNA (pneumonia) 04/16/2021   COVID-19    Elevated troponin    NSTEMI (non-ST elevated myocardial infarction) ([place]) 07/31/2020   Grieving 02/16/2020   Bilateral inguinal hernia s/p lap hernia repair w mesh 10/30/2017 10/30/2017   Left femoral hernia s/p lap hernia repair w mesh  10/30/2017 10/30/2017   Loss of weight 01/21/2016   Heme positive stool 01/21/2016   Heart murmur 08/18/2013   Chronic kidney disease, stage III (moderate) ([place]) 07/14/2013   Hyperlipidemia LDL goal < 100 07/14/2013   Essential hypertension, benign 07/14/2013   Type II or unspecified type diabetes mellitus with renal manifestations, not stated as uncontrolled(250.40) 07/14/2013   Insomnia 07/14/2013   Angioedema of lips 12/26/2012   MGUS (monoclonal gammopathy of unknown significance) 08/01/2011   Gout of right wrist 08/01/2011   S/P coronary artery stent placement 08/01/2011   DM type 2 causing CKD stage 2 ([place]) 08/01/2011   Bradycardia 10/15/2010   Mixed hyperlipidemia due to type 2 diabetes mellitus ([place]) 05/01/2010   Essential hypertension 05/01/2010   CAD, NATIVE VESSEL 05/01/2010   PVD 05/01/2010   PCP:  [name], MD Pharmacy:   [id number] [place], [place] [address] [place] [address] Phone: [phone] Fax: [phone]  [facility] - [place], [facility] [address] [place] [address] [place] [address] Phone: [phone] Fax: [phone]     Social Determinants of Health (SDOH) Interventions    Readmission Risk Interventions No flowsheet data found.

## 2021-04-18 NOTE — Progress Notes (Signed)
°  Transition of Care [facility]) Screening Note   Patient Details  Name: Kevin Schwartz Date of Birth: 03-Jul-1930   Transition of Care [facility]) CM/SW Contact:    Cyndi Bender, RN Phone Number: 04/18/2021, 2:05 PM    Transition of Care Department [facility]) has reviewed patient. We will continue to monitor patient advancement through interdisciplinary progression rounds and therapy recommendation has he progresses.

## 2021-04-18 NOTE — Significant Event (Addendum)
Rapid Response Event Note   Reason for Call :  Inability to obtain temp or SpO2.  ABG/PCXR ordered by MD prior to RRT arrival  Initial Focused Assessment:  Pt lying in bed in no visible distress. He says his breathing has been getting progressively worse t/o the day. He denies chest pain. Lungs wheezy t/o. Skin cold to touch.  Oxygen probe changed from cold finger to warmer earlobe. Rectal temp done.   T-93.6(O) vs 95.6(R), HR-83, BP-108/69, RR-18, SpO2-100% on RA.   Interventions:  CBG-91 ABG-unable to obtain-order cancelled PCXR-worsening PNA vs. Edema Combivent inhaler given Warm blankets placed on pt.  Oxygen probe changed to ear  Plan of Care:  Pt says his breathing is better after breathing treatment. Pt instructed to call RN if breathing worsens again. Recommend rechecking temp in 2 hours. Continue to monitor pt closely. Call RRT if further assistance needed.   Event Summary:   MD Notified: Dr. Bridgett Larsson notified PTA RRT and again by RRT after pt seen. Call [place], Kelsay Haggard Anderson, RN

## 2021-04-18 NOTE — Evaluation (Signed)
Occupational Therapy Evaluation Patient Details Name: Kevin Schwartz MRN: [id number] DOB: September 24, 1930 Today's Date: 04/18/2021   History of Present Illness Pt is an 88 y/o male admitted secondary to weakness and LE swelling. Pt also with covid PNA. PMH includes CAD, CHF, CKD, HTN, and DM.   Clinical Impression   PTA patient was living alone in a private residence and was grossly Mod I with ADLs/IADLs with use of AD. Patient still drives. Patient currently functioning below baseline demonstrating observed ADLs with Mod to Max A grossly. Patient also limited by deficits listed below including decreased dynamic sitting/standing balance, decreased activity tolerance and generalized weakness and would benefit from continued acute OT services in prep for safe d/c to next level of care. OT will continue to follow acutely.        Recommendations for follow up therapy are one component of a multi-disciplinary discharge planning process, led by the attending physician.  Recommendations may be updated based on patient status, additional functional criteria and insurance authorization.   Follow Up Recommendations  Skilled nursing-short term rehab (<3 hours/day)    Assistance Recommended at Discharge Frequent or constant Supervision/Assistance  Patient can return home with the following A lot of help with walking and/or transfers;A lot of help with bathing/dressing/bathroom    Functional Status Assessment  Patient has had a recent decline in their functional status and demonstrates the ability to make significant improvements in function in a reasonable and predictable amount of time.  Equipment Recommendations  Other (comment) (Defer to next level of care.)    Recommendations for Other Services       Precautions / Restrictions Precautions Precautions: Fall Restrictions Weight Bearing Restrictions: No      Mobility Bed Mobility Overal bed mobility: Needs Assistance Bed Mobility:  Supine to Sit     Supine to sit: Mod assist     General bed mobility comments: Able to advance BLE toward EOB without external assist. Mod A to elevate trunk with HOB elevated. Increased time/effort +rail.    Transfers Overall transfer level: Needs assistance Equipment used: Rolling walker (2 wheels) Transfers: Sit to/from Stand;Bed to chair/wheelchair/BSC Sit to Stand: Mod assist;Max assist Stand pivot transfers: Mod assist         General transfer comment: Mod A for elevated surfaces and Max A from low surfaces. Cues for hand placement. Posterior bias. Mod A for stand-pivot to recliner with increased time/effort. Cues to correct posterior bias.      Balance Overall balance assessment: Needs assistance Sitting-balance support: No upper extremity supported Sitting balance-[name]: Fair     Standing balance support: Single extremity supported Standing balance-[name]: Poor Standing balance comment: Reliant on UE and external support                           ADL either performed or assessed with clinical judgement   ADL Overall ADL's : Needs assistance/impaired Eating/Feeding: Set up;Sitting   Grooming: Set up;Sitting   Upper Body Bathing: Minimal assistance;Sitting   Lower Body Bathing: Maximal assistance;Sit to/from stand   Upper Body Dressing : Minimal assistance;Sitting   Lower Body Dressing: Maximal assistance;Sit to/from stand   Toilet Transfer: Maximal assistance;Rollator (4 wheels);Stand-pivot Armed forces technical officer Details (indicate cue type and reason): Simulated with transfer to recliner with use of RW. Increased time/effort. Posterior bias.                 Vision Baseline Vision/History: 1 Wears glasses Ability to See  in Adequate Light: 1 Impaired Patient Visual Report: No change from baseline Vision Assessment?: No apparent visual deficits     Perception     Praxis      Pertinent Vitals/Pain Pain Assessment: No/denies  pain Pain Intervention(s): Monitored during session     Hand Dominance     Extremity/Trunk Assessment Upper Extremity Assessment Upper Extremity Assessment: Generalized weakness   Lower Extremity Assessment Lower Extremity Assessment: Defer to PT evaluation   Cervical / Trunk Assessment Cervical / Trunk Assessment: Kyphotic   Communication Communication Communication: HOH   Cognition Arousal/Alertness: Awake/alert Behavior During Therapy: WFL for tasks assessed/performed Overall Cognitive Status: No family/caregiver present to determine baseline cognitive functioning                                 General Comments: Follows 1-step verbal commands with good accuracy, limited by [facility].     General Comments  SpO2 with poor pleth throughout despite multiple probes. On RA upon entry. DOE 3/4 with transfer to recliner. Ed. on pursed lip breathing. HR in 80's throughout.    Exercises     Shoulder Instructions      Home Living Family/patient expects to be discharged to:: Other (Comment) (motel) Living Arrangements: Alone Available Help at Discharge: Family;Available PRN/intermittently Type of Home: Other(Comment) (motel) Home Access: Level entry     Home Layout: One level     Bathroom Shower/Tub: Teacher, early years/pre: Standard     Home Equipment: Cane - single point;BSC/3in1;Rollator (4 wheels)          Prior Functioning/Environment Prior Level of Function : Independent/Modified Independent             Mobility Comments: Uses rollator for mobility ADLs Comments: Reports increased difficulty, but does not require assist. Son assist with financial management. Patient still drives.        OT Problem List: Decreased activity tolerance;Impaired balance (sitting and/or standing);Decreased knowledge of use of DME or AE;Cardiopulmonary status limiting activity      OT Treatment/Interventions: Self-care/ADL training;Therapeutic  exercise;Energy conservation;DME and/or AE instruction;Therapeutic activities;Patient/family education;Balance training    OT Goals(Current goals can be found in the care plan section) Acute Rehab OT Goals Patient Stated Goal: To feel better. OT Goal Formulation: With patient Time For Goal Achievement: 05/02/21 Potential to Achieve Goals: Good ADL Goals Pt Will Perform Grooming: with modified independence;standing Pt Will Perform Upper Body Dressing: with modified independence;sitting Pt Will Perform Lower Body Dressing: with modified independence;sit to/from stand Pt Will Transfer to Toilet: with modified independence;ambulating Pt Will Perform Toileting - Clothing Manipulation and hygiene: with modified independence;sit to/from stand Pt Will Perform Tub/Shower Transfer: Tub transfer;with supervision;rolling walker Pt/caregiver will Perform Home Exercise Program: Increased strength;Both right and left upper extremity;With written HEP provided Additional ADL Goal #1: Patient will tolerate 15 minutes of therapeutic activity without need for rest break indicating improved activity tolerance in prep for ADLs.  OT Frequency: Min 2X/week    Co-evaluation              AM-PAC OT "6 Clicks" Daily Activity     Outcome Measure Help from another person eating meals?: A Little Help from another person taking care of personal grooming?: A Little Help from another person toileting, which includes using toliet, bedpan, or urinal?: A Lot Help from another person bathing (including washing, rinsing, drying)?: A Lot Help from another person to put on and taking off regular  upper body clothing?: A Little Help from another person to put on and taking off regular lower body clothing?: A Lot 6 Click Score: 15   End of Session Equipment Utilized During Treatment: Gait belt;Rolling walker (2 wheels) Nurse Communication: Mobility status  Activity Tolerance: Patient tolerated treatment well Patient  left: in chair;with call bell/phone within reach;Other (comment) (No batteries in chair alarm. NT made aware.)  OT Visit Diagnosis: Unsteadiness on feet (R26.81);Muscle weakness (generalized) (M62.81)                Time: 3818-2993 OT Time Calculation (min): 24 min Charges:  OT General Charges $OT Visit: 1 Visit OT Evaluation $OT Eval Moderate Complexity: 1 Mod OT Treatments $Therapeutic Activity: 8-22 mins  Kynnedy Carreno H. OTR/L Supplemental OT, Department of rehab services [phone]  Matas Burrows R H. 04/18/2021, 10:44 AM

## 2021-04-18 NOTE — Discharge Instructions (Signed)
Information on my medicine - ELIQUIS (apixaban)  This medication education was reviewed with me or my healthcare representative as part of my discharge preparation.   Why was Eliquis prescribed for you? Eliquis was prescribed for you to reduce the risk of a blood clot forming that can cause a stroke if you have a medical condition called atrial fibrillation or atrial flutter (a type of irregular heartbeat).  What do You need to know about Eliquis ? Take your Eliquis TWICE DAILY - one tablet in the morning and one tablet in the evening with or without food. If you have difficulty swallowing the tablet whole please discuss with your pharmacist how to take the medication safely.  Take Eliquis exactly as prescribed by your doctor and DO NOT stop taking Eliquis without talking to the doctor who prescribed the medication.  Stopping may increase your risk of developing a stroke.  Refill your prescription before you run out.  After discharge, you should have regular check-up appointments with your healthcare provider that is prescribing your Eliquis.  In the future your dose may need to be changed if your kidney function or weight changes by a significant amount or as you get older.  What do you do if you miss a dose? If you miss a dose, take it as soon as you remember on the same day and resume taking twice daily.  Do not take more than one dose of ELIQUIS at the same time to make up a missed dose.  Important Safety Information A possible side effect of Eliquis is bleeding. You should call your healthcare provider right away if you experience any of the following: Bleeding from an injury or your nose that does not stop. Unusual colored urine (red or dark brown) or unusual colored stools (red or black). Unusual bruising for unknown reasons. A serious fall or if you hit your head (even if there is no bleeding).  Some medicines may interact with Eliquis and might increase your risk of  bleeding or clotting while on Eliquis. To help avoid this, consult your healthcare provider or pharmacist prior to using any new prescription or non-prescription medications, including herbals, vitamins, non-steroidal anti-inflammatory drugs (NSAIDs) and supplements.  This website has more information on Eliquis (apixaban): http://[email or web address].com/eliquis/home

## 2021-04-18 NOTE — Progress Notes (Signed)
[facility] PROGRESS NOTE   Kevin Schwartz DOB: [birth year] DOA: 04/16/2021  PCP: [name], MD  Brief History/Interval Summary: 88 y.o. male with medical history significant of CAD with stenting on Plavix and Eliquis regimen, chronic systolic CHF LVEF 24-82%, CKD stage IIIa, HTN, HLD, BPH, IIDM, presented with increasing shortness of breath and cough and leg swelling.  Patient tested positive for COVID-19.  Imaging studies showed left upper lobe consolidation and bilateral groundglass opacities.  Patient was hospitalized for further management.  Reason for Visit: Pneumonia due to COVID-19.  Acute systolic CHF  Consultants: None  Procedures: None    Subjective/Interval History: Patient mentions that from a breathing standpoint he is doing much better. He is puzzled about his low blood pressures but denies any dizziness lightheadedness.  Lower extremity swelling has also improved.  Cough is getting better.   Assessment/Plan:  Pneumonia due to COVID-19/sepsis, present on admission Patient had sepsis criteria on admission.  Noted to be tachypneic, tachycardic with elevated WBC.  He also had elevated lactic acid level. It is felt that his presentation was primarily due to COVID-19 with some concern for secondary bacterial infection.  Procalcitonin was 0.8. Patient is currently on Remdesivir, today is day 3 of 5.  Remains on ceftriaxone as well. Currently saturating in the mid 90s on room air.  Has mild hemoptysis which could be from pneumonia.  Patient does not have any symptoms or signs otherwise suggestive of thromboembolic process.   However his D-dimer is noted to be elevated which could from COVID-19.  A lower extremity Doppler study was negative for DVT.   CRP was elevated at 12.7.  Improved to 10.1 today. Continue with dexamethasone. Respiratory status is stable.  He saturating normal on room air.  Acute on chronic systolic CHF EF is noted to  be 45 to 50% on echocardiogram done in April 2022 no clear indication to repeat echocardiogram at this time.  He was noted to have lower extremity edema.  He was given a low-dose of furosemide yesterday due to low blood pressures.  Blood pressures remain low.  Edema has improved.  Renal function has worsened.  See discussion below. Holding his cardiac medications due to low blood pressures.   Seems to be hypovolemic today.  Acute on chronic kidney disease stage IIIa Baseline creatinine seems to be around 1.3-1.6.  Renal function is worsened today to 1.9 from 1.6 yesterday.  Possibly from furosemide.  Low blood pressure is also contributing.  He appears to be intravascularly depleted considering elevated BUN.  We will give him gentle IV hydration today.  Monitor urine output.  UA reviewed.  Specific gravity noted to be greater than 1.03.  Proteinuria is noted.  Acute metabolic encephalopathy Likely due to COVID-19.  Seems to have resolved.  Back to baseline now.  History of coronary artery disease Stable.  Continue Plavix.  Diabetes mellitus type 2, with renal complications, with chronic kidney disease stage IIIa He was noted to have hypoglycemia initially.  Wilder Glade was held.  CBGs have stabilized.  HbA1c 6.2.    History of a flutter, unspecified Followed by cardiology.  On Eliquis for stroke prevention.   DVT Prophylaxis: On Eliquis Code Status: Full code Family Communication: No family at bedside.  Discussed with patient Disposition Plan: SNF recommended by physical therapy  Status is: Inpatient  Remains inpatient appropriate because: Acute on chronic systolic CHF, pneumonia due to COVID-19    Medications: Scheduled:  allopurinol  100 mg Oral  Daily   apixaban  5 mg Oral BID   atorvastatin  80 mg Oral Daily   citalopram  20 mg Oral Daily   clopidogrel  75 mg Oral Q breakfast   dexamethasone (DECADRON) injection  6 mg Intravenous Q24H   ezetimibe  10 mg Oral Daily   insulin  aspart  0-9 Units Subcutaneous TID WC   Ipratropium-Albuterol  1 puff Inhalation Q4H   sodium chloride flush  3 mL Intravenous Q12H   Continuous:  sodium chloride     cefTRIAXone (ROCEPHIN)  IV Stopped (04/17/21 1041)   remdesivir 100 mg in NS 100 mL 100 mg (04/18/21 1004)   OBS:JGGEZM chloride, acetaminophen, guaiFENesin-dextromethorphan, ondansetron (ZOFRAN) IV, sodium chloride flush  Antibiotics: Anti-infectives (From admission, onward)    Start     Dose/Rate Route Frequency Ordered Stop   04/17/21 1000  remdesivir 100 mg in sodium chloride 0.9 % 100 mL IVPB       See Hyperspace for full Linked Orders Report.   100 mg 200 mL/hr over 30 Minutes Intravenous Daily 04/16/21 1640 04/21/21 0959   04/17/21 1000  cefTRIAXone (ROCEPHIN) 1 g in sodium chloride 0.9 % 100 mL IVPB        1 g 200 mL/hr over 30 Minutes Intravenous Every 24 hours 04/16/21 1734     04/16/21 2000  remdesivir 200 mg in sodium chloride 0.9% 250 mL IVPB       See Hyperspace for full Linked Orders Report.   200 mg 580 mL/hr over 30 Minutes Intravenous Once 04/16/21 1640 04/16/21 2039   04/16/21 1500  doxycycline (VIBRAMYCIN) 100 mg in sodium chloride 0.9 % 250 mL IVPB        100 mg 125 mL/hr over 120 Minutes Intravenous  Once 04/16/21 1448 04/16/21 1912   04/16/21 1445  cefTRIAXone (ROCEPHIN) 1 g in sodium chloride 0.9 % 100 mL IVPB        1 g 200 mL/hr over 30 Minutes Intravenous  Once 04/16/21 1441 04/16/21 1659   04/16/21 1445  azithromycin (ZITHROMAX) 500 mg in sodium chloride 0.9 % 250 mL IVPB  Status:  Discontinued        500 mg 250 mL/hr over 60 Minutes Intravenous  Once 04/16/21 1441 04/16/21 1448       Objective:  Vital Signs  Vitals:   04/17/21 2323 04/18/21 0000 04/18/21 0400 04/18/21 0841  BP:  (!) 85/64 (!) 89/63 97/62  Pulse: 79 73 70 76  Resp: 16 15 18 18   Temp: 98.1 F (36.7 C) (!) 97.3 F (36.3 C) 98 F (36.7 C) (!) 97.5 F (36.4 C)  TempSrc: Oral Axillary Axillary Oral  SpO2: 96%  97% 99% 98%  Weight:   83.2 kg     Intake/Output Summary (Last 24 hours) at 04/18/2021 1124 Last data filed at 04/17/2021 2221 Gross per 24 hour  Intake 360 ml  Output --  Net 360 ml    Filed Weights   04/18/21 0400  Weight: 83.2 kg    General appearance: Awake alert.  In no distress Resp: Normal effort at rest.  Coarse breath sounds without any crackles today.  No wheezing or rhonchi. Cardio: S1-S2 is normal regular.  No S3-S4.  No rubs murmurs or bruit GI: Abdomen is soft.  Nontender nondistended.  Bowel sounds are present normal.  No masses organomegaly Extremities: Improved edema bilateral lower EXTR Neurologic: Alert and oriented x3.  No focal neurological deficits.    Lab Results:  Data Reviewed: I have personally  reviewed following labs and imaging studies  CBC: Recent Labs  Lab 04/11/21 1127 04/16/21 1450 04/16/21 2005  WBC 6.1 11.5*  --   NEUTROABS 5,026 10.5*  --   HGB 14.4 15.8 15.0  HCT 43.6 46.4 44.0  MCV 92.4 90.1  --   PLT 197 225  --      Basic Metabolic Panel: Recent Labs  Lab 04/11/21 1127 04/16/21 1450 04/16/21 2005 04/17/21 0459 04/18/21 0121  NA 139 134* 136 134* 130*  K 4.9 4.5 5.1 4.6 5.0  CL 105 104  --  105 105  CO2 18* 18*  --  18* 18*  GLUCOSE 76 72  --  105* 205*  BUN 34* 34*  --  37* 47*  CREATININE 2.04* 1.66*  --  1.65* 1.94*  CALCIUM 9.5 8.6*  --  7.7* 7.6*  MG  --  2.0  --  1.7  --   PHOS  --   --   --  3.5  --      GFR: Estimated Creatinine Clearance: 27.1 mL/min (A) (by C-G formula based on SCr of 1.94 mg/dL (H)).  Liver Function Tests: Recent Labs  Lab 04/16/21 1450  AST 62*  ALT 30  ALKPHOS 83  BILITOT 2.4*  PROT 7.8  ALBUMIN 3.2*      Coagulation Profile: Recent Labs  Lab 04/16/21 1450  INR 1.1      HbA1C: Recent Labs    04/16/21 1957  HGBA1C 6.2*     CBG: Recent Labs  Lab 04/17/21 0907 04/17/21 1212 04/17/21 1716 04/17/21 2234 04/18/21 0848  GLUCAP 87 136* 126* 161* 144*      Anemia Panel: Recent Labs    04/16/21 1957 04/17/21 0459  FERRITIN 317 286     Recent Results (from the past 240 hour(s))  SARS-COV-2 RNA,(COVID-19) QUAL NAAT     Status: Abnormal   Collection Time: 04/11/21 11:17 AM  Result Value Ref Range Status   SARS CoV2 RNA DETECTED (A) NOT DETECTED Final    Comment: . A Detected result indicates that the patient's specimen was positive for SARS-CoV-2 RNA. [name] Test Method: Nucleic Acid Amplification Test including reverse transcription polymerase chain reaction (RT-PCR) and transcription mediated amplification (TMA). The test method meets the [place] Centers for Disease Control and prevention (CDC) pre departure and arrival requirement for viral test for COVID-19 dated May 12, 2019. Testing requirements for traveling may change with time. The patient is responsible for determining the test requirements for each nation while they are traveling. . This test has been authorized by the FDA under an  Emergency Use Authorization (EUA) for use by authorized laboratories. . Please review the "Fact Sheets" and FDA authorized labeling available for health care providers and patients using the following websites: https://www.[email or web address]/home/Covid-19/HCP/NAAT/fact-sheet2  [email or web address]://[email or web address]/home/Covid-19/Patients/NAAT / fact-sheet2 . Due to the current public health emergency, [facility] is accepting samples from appropriate clinical sources collected using wide variety of swabs and transport media for COVID-19. Not detected test results derived from specimens received in non- commercially manufactured viral collection kits or those not yet authorized by FDA for COVID-19 testing should be cautiously evaluated and take extra precautions such as additional clinical monitoring, including collection of an additional specimen. . Additional information about COVID-19 can be found at the [employer]  website: www.[email or web address].com/Covid19. . For patients with a Detected or Inconclusive test result, please see CDC's COVID-19 Treatments and  Medications page located at  BroadcastLocal.cz- severe-illness.html for information on COVID-19 therapeutics. . For patients with  a Not Detected test result, please see CDC's Vacci nes for COVID-19 page located at PhoneStatistics.is for information on COVID-19 vaccines.   Blood Culture (routine x 2)     Status: None (Preliminary result)   Collection Time: 04/16/21  2:17 PM   Specimen: Left Antecubital; Blood  Result Value Ref Range Status   Specimen Description LEFT ANTECUBITAL  Final   Special Requests   Final    BOTTLES DRAWN AEROBIC AND ANAEROBIC Blood Culture results may not be optimal due to an inadequate volume of blood received in culture bottles   Culture   Final    NO GROWTH 2 DAYS Performed at [facility], [place] [address], [place], [place] [address]    Report Status PENDING  Incomplete  Resp Panel by RT-PCR (Flu A&B, Covid) Nasopharyngeal Swab     Status: Abnormal   Collection Time: 04/16/21  2:54 PM   Specimen: Nasopharyngeal Swab; Nasopharyngeal(NP) swabs in vial transport medium  Result Value Ref Range Status   SARS Coronavirus 2 by RT PCR POSITIVE (A) NEGATIVE Final    Comment: (NOTE) SARS-CoV-2 target nucleic acids are DETECTED.  The SARS-CoV-2 RNA is generally detectable in upper respiratory specimens during the acute phase of infection. Positive results are indicative of the presence of the identified virus, but do not rule out bacterial infection or co-infection with other pathogens not detected by the test. Clinical correlation with patient history and other diagnostic information is necessary to determine patient infection status. The expected result is Negative.  Fact Sheet for  Patients: EntrepreneurPulse.com.au  Fact Sheet for Healthcare Providers: IncredibleEmployment.be  This test is not yet approved or cleared by the [place] FDA and  has been authorized for detection and/or diagnosis of SARS-CoV-2 by FDA under an Emergency Use Authorization (EUA).  This EUA will remain in effect (meaning this test can be used) for the duration of  the COVID-19 declaration under Section 564(b)(1) of the A ct, 21 U.S.C. section 360bbb-3(b)(1), unless the authorization is terminated or revoked sooner.     Influenza A by PCR NEGATIVE NEGATIVE Final   Influenza B by PCR NEGATIVE NEGATIVE Final    Comment: (NOTE) The Xpert Xpress SARS-CoV-2/FLU/RSV plus assay is intended as an aid in the diagnosis of influenza from Nasopharyngeal swab specimens and should not be used as a sole basis for treatment. Nasal washings and aspirates are unacceptable for Xpert Xpress SARS-CoV-2/FLU/RSV testing.  Fact Sheet for Patients: EntrepreneurPulse.com.au  Fact Sheet for Healthcare Providers: IncredibleEmployment.be  This test is not yet approved or cleared by the [place] FDA and has been authorized for detection and/or diagnosis of SARS-CoV-2 by FDA under an Emergency Use Authorization (EUA). This EUA will remain in effect (meaning this test can be used) for the duration of the COVID-19 declaration under Section 564(b)(1) of the Act, 21 U.S.C. section 360bbb-3(b)(1), unless the authorization is terminated or revoked.  Performed at [facility], [place] [address] [place], [place] [address]        Radiology Studies: CT CHEST WO CONTRAST  Result Date: 04/16/2021 CLINICAL DATA:  Confusion, COVID-19 positive, pneumonia EXAM: CT CHEST WITHOUT CONTRAST TECHNIQUE: Multidetector CT imaging of the chest was performed following the standard protocol without IV contrast. COMPARISON:  04/16/2021 FINDINGS:  Cardiovascular: Unenhanced imaging of the heart demonstrates cardiomegaly without pericardial effusion. Normal caliber of the thoracic aorta. Evaluation of the vascular lumen is limited without IV contrast. There is severe atherosclerosis of the coronary vasculature, with mild atherosclerosis of the  thoracic aorta. Mediastinum/Nodes: Borderline enlarged mediastinal and hilar lymph nodes are seen, measuring up to 15 mm in the subcarinal region. Evaluation of the hilar structures is limited without IV contrast. Thyroid, trachea, and esophagus are unremarkable. Lungs/Pleura: There is multifocal bilateral ground-glass airspace disease, compatible with given history of COVID pneumonia. Wedge-shaped dense consolidation within the left upper lobe corresponds to opacity on chest x-ray. This area measures approximately 4.5 x 4.0 cm. Differential in this region would include focal pneumonia of uncertain etiology, infarct, or underlying neoplasm. No effusion or pneumothorax.  Central airways are patent. Upper Abdomen: Nodularity of the liver capsule likely reflect underlying cirrhosis. Upper abdominal ascites. No acute finding. Musculoskeletal: No acute or destructive bony lesions. Bilateral shoulder osteoarthritis left greater than right. Reconstructed images demonstrate no additional findings. IMPRESSION: 1. Multifocal bilateral ground-glass airspace disease compatible with given history of COVID-19. 2. Wedge-shaped dense consolidation within the left upper lobe, measuring 4.5 x 4.0 cm, corresponding to chest x-ray findings. While this could reflect focal dense pneumonia, pulmonary infarct or neoplasm could give a similar appearance. If pulmonary embolus is a clinical concern, CT pulmonary angiography could be considered. Otherwise, follow-up imaging after appropriate medical management is recommended to assess for resolution. 3. Likely reactive mediastinal and hilar adenopathy. 4. Cardiomegaly. 5. Nodular liver contour  compatible cirrhosis. Upper abdominal ascites. 6. Aortic Atherosclerosis (ICD10-I70.0). Coronary artery atherosclerosis. Electronically Signed   By: Randa Ngo M.D.   On: 04/16/2021 16:45   DG CHEST PORT 1 VIEW  Result Date: 04/17/2021 CLINICAL DATA:  CHF. EXAM: PORTABLE CHEST 1 VIEW COMPARISON:  04/16/2021. FINDINGS: The heart is enlarged. The pulmonary vasculature is within normal limits. Atherosclerotic calcification of the aorta is noted. A focal opacity is present in the mid left lung, slightly increased from the prior exam. A few scattered air space opacities are noted bilaterally. No effusion or pneumothorax. No acute osseous abnormality. IMPRESSION: 1. Focal opacity in the mid left lung and scattered airspace opacities bilaterally, slightly increased from the prior exam. 2. Cardiomegaly. Electronically Signed   By: Brett Fairy M.D.   On: 04/17/2021 04:48   DG Chest Port 1 View  Result Date: 04/16/2021 CLINICAL DATA:  Questionable sepsis, left-sided chest pain EXAM: PORTABLE CHEST 1 VIEW COMPARISON:  07/31/2020 FINDINGS: Cardiomegaly. Focal, heterogeneous, somewhat masslike opacity of the peripheral left midlung. Osseous structures are unremarkable. IMPRESSION: 1. Focal, heterogeneous, somewhat masslike opacity of the peripheral left midlung, which may reflect infection in the appropriate clinical setting, mass not excluded. Consider CT to further evaluate. At minimum, recommend follow-up radiographs in 6-8 weeks to ensure complete radiographic resolution. 2. Cardiomegaly. Electronically Signed   By: Delanna Ahmadi M.D.   On: 04/16/2021 14:35   VAS [place] LOWER EXTREMITY VENOUS (DVT)  Result Date: 04/17/2021  Lower Venous DVT Study Patient Name:  WYLIE COON [facility]  Date of Exam:   04/17/2021 Medical Rec #: [id number]                Accession #:    [id number] Date of Birth: 1930/08/08                 Patient Gender: M Patient Age:   59 years Exam Location:  [facility] Procedure:      VAS [place]  LOWER EXTREMITY VENOUS (DVT) Referring Phys: Bonnielee Haff --------------------------------------------------------------------------------  Indications: Swelling.  Risk Factors: None identified. Limitations: Body habitus, poor ultrasound/tissue interface and patient positioning. Comparison Study: No prior studies. Performing Technologist: Oliver Hum RVT  Examination Guidelines: A  complete evaluation includes B-mode imaging, spectral Doppler, color Doppler, and power Doppler as needed of all accessible portions of each vessel. Bilateral testing is considered an integral part of a complete examination. Limited examinations for reoccurring indications may be performed as noted. The reflux portion of the exam is performed with the patient in reverse Trendelenburg.  +---------+---------------+---------+-----------+----------+--------------+  RIGHT     Compressibility Phasicity Spontaneity Properties Thrombus Aging  +---------+---------------+---------+-----------+----------+--------------+  CFV       Full            Yes       Yes                                    +---------+---------------+---------+-----------+----------+--------------+  SFJ       Full                                                             +---------+---------------+---------+-----------+----------+--------------+  FV Prox   Full                                                             +---------+---------------+---------+-----------+----------+--------------+  FV Mid    Full                                                             +---------+---------------+---------+-----------+----------+--------------+  FV Distal Full                                                             +---------+---------------+---------+-----------+----------+--------------+  PFV       Full                                                             +---------+---------------+---------+-----------+----------+--------------+  POP       Full            Yes        Yes                                    +---------+---------------+---------+-----------+----------+--------------+  PTV       Full                                                             +---------+---------------+---------+-----------+----------+--------------+  PERO      Full                                                             +---------+---------------+---------+-----------+----------+--------------+   +---------+---------------+---------+-----------+----------+--------------+  LEFT      Compressibility Phasicity Spontaneity Properties Thrombus Aging  +---------+---------------+---------+-----------+----------+--------------+  CFV       Full            Yes       Yes                                    +---------+---------------+---------+-----------+----------+--------------+  SFJ       Full                                                             +---------+---------------+---------+-----------+----------+--------------+  FV Prox   Full                                                             +---------+---------------+---------+-----------+----------+--------------+  FV Mid    Full                                                             +---------+---------------+---------+-----------+----------+--------------+  FV Distal Full            Yes       Yes                                    +---------+---------------+---------+-----------+----------+--------------+  PFV       Full                                                             +---------+---------------+---------+-----------+----------+--------------+  POP       Full            Yes       Yes                                    +---------+---------------+---------+-----------+----------+--------------+  PTV       Full                                                             +---------+---------------+---------+-----------+----------+--------------+  PERO      Full                                                              +---------+---------------+---------+-----------+----------+--------------+     Summary: RIGHT: - There is no evidence of deep vein thrombosis in the lower extremity. However, portions of this examination were limited- see technologist comments above.  - No cystic structure found in the popliteal fossa.  LEFT: - There is no evidence of deep vein thrombosis in the lower extremity. However, portions of this examination were limited- see technologist comments above.  - No cystic structure found in the popliteal fossa.  *See table(s) above for measurements and observations. Electronically signed by Harold Barban MD on 04/17/2021 at 11:32:05 PM.    Final        LOS: 2 days   [facility] Pager on [email or web address]  04/18/2021, 11:24 AM

## 2021-04-18 NOTE — Progress Notes (Addendum)
At midnight BP 85/64 map 72, pulse between 39-61 during sleep. On call  provider E. Chen notified, no new orders received. Patient ate 75% dinner and drinking enough fluid. Have been running low BP, lasix was administered at day  for BLE edema. Patient is stable will continue to monitor.

## 2021-04-19 ENCOUNTER — Inpatient Hospital Stay (HOSPITAL_COMMUNITY): Payer: Medicare HMO

## 2021-04-19 DIAGNOSIS — E1122 Type 2 diabetes mellitus with diabetic chronic kidney disease: Secondary | ICD-10-CM | POA: Diagnosis not present

## 2021-04-19 DIAGNOSIS — N1831 Chronic kidney disease, stage 3a: Secondary | ICD-10-CM | POA: Diagnosis not present

## 2021-04-19 DIAGNOSIS — N179 Acute kidney failure, unspecified: Secondary | ICD-10-CM | POA: Diagnosis not present

## 2021-04-19 DIAGNOSIS — U071 COVID-19: Secondary | ICD-10-CM | POA: Diagnosis not present

## 2021-04-19 DIAGNOSIS — I5023 Acute on chronic systolic (congestive) heart failure: Secondary | ICD-10-CM

## 2021-04-19 LAB — CBC
HCT: 36.6 % — ABNORMAL LOW (ref 39.0–52.0)
Hemoglobin: 12.4 g/dL — ABNORMAL LOW (ref 13.0–17.0)
MCH: 29.8 pg (ref 26.0–34.0)
MCHC: 33.9 g/dL (ref 30.0–36.0)
MCV: 88 fL (ref 80.0–100.0)
Platelets: 269 10*3/uL (ref 150–400)
RBC: 4.16 MIL/uL — ABNORMAL LOW (ref 4.22–5.81)
RDW: 17.1 % — ABNORMAL HIGH (ref 11.5–15.5)
WBC: 18.3 10*3/uL — ABNORMAL HIGH (ref 4.0–10.5)
nRBC: 0.2 % (ref 0.0–0.2)

## 2021-04-19 LAB — BASIC METABOLIC PANEL
Anion gap: 12 (ref 5–15)
BUN: 59 mg/dL — ABNORMAL HIGH (ref 8–23)
CO2: 15 mmol/L — ABNORMAL LOW (ref 22–32)
Calcium: 7.6 mg/dL — ABNORMAL LOW (ref 8.9–10.3)
Chloride: 102 mmol/L (ref 98–111)
Creatinine, Ser: 2.36 mg/dL — ABNORMAL HIGH (ref 0.61–1.24)
GFR, Estimated: 26 mL/min — ABNORMAL LOW (ref >60)
Glucose, Bld: 107 mg/dL — ABNORMAL HIGH (ref 70–99)
Potassium: 5.3 mmol/L — ABNORMAL HIGH (ref 3.5–5.1)
Sodium: 129 mmol/L — ABNORMAL LOW (ref 135–145)

## 2021-04-19 LAB — C-REACTIVE PROTEIN: CRP: 7.2 mg/dL — ABNORMAL HIGH (ref <1.0)

## 2021-04-19 LAB — ECHOCARDIOGRAM LIMITED
AR max vel: 3.23 cm2
AV Area VTI: 3.03 cm2
AV Area mean vel: 2.88 cm2
AV Mean grad: 4.5 mmHg
AV Peak grad: 7.8 mmHg
Ao pk vel: 1.4 m/s
Area-P 1/2: 3.6 cm2
Calc EF: 42.8 %
Height: 67 in
MV M vel: 4.95 m/s
MV Peak grad: 98 mmHg
MV VTI: 1.73 cm2
P 1/2 time: 388 msec
Radius: 0.7 cm
S' Lateral: 3.8 cm
Single Plane A2C EF: 40.5 %
Single Plane A4C EF: 45.3 %
Weight: 2920.65 oz

## 2021-04-19 LAB — SODIUM, URINE, RANDOM: Sodium, Ur: 91 mmol/L

## 2021-04-19 LAB — CREATININE, URINE, RANDOM: Creatinine, Urine: 26.51 mg/dL

## 2021-04-19 LAB — CORTISOL-AM, BLOOD: Cortisol - AM: 8.9 ug/dL (ref 6.7–22.6)

## 2021-04-19 LAB — GLUCOSE, CAPILLARY
Glucose-Capillary: 94 mg/dL (ref 70–99)
Glucose-Capillary: 125 mg/dL — ABNORMAL HIGH (ref 70–99)
Glucose-Capillary: 124 mg/dL — ABNORMAL HIGH (ref 70–99)
Glucose-Capillary: 113 mg/dL — ABNORMAL HIGH (ref 70–99)

## 2021-04-19 LAB — CK: Total CK: 216 U/L (ref 49–397)

## 2021-04-19 LAB — PHOSPHORUS: Phosphorus: 4.5 mg/dL (ref 2.5–4.6)

## 2021-04-19 LAB — BRAIN NATRIURETIC PEPTIDE: B Natriuretic Peptide: 427.6 pg/mL — ABNORMAL HIGH (ref 0.0–100.0)

## 2021-04-19 MED ORDER — SODIUM ZIRCONIUM CYCLOSILICATE 10 G PO PACK
10.0000 g | PACK | Freq: Once | ORAL | Status: AC
  Administered 2021-04-19: 10 g via ORAL
  Filled 2021-04-19: qty 1

## 2021-04-19 MED ORDER — FUROSEMIDE 10 MG/ML IJ SOLN
60.0000 mg | Freq: Once | INTRAMUSCULAR | Status: AC
  Administered 2021-04-19: 60 mg via INTRAVENOUS
  Filled 2021-04-19: qty 6

## 2021-04-19 MED ORDER — APIXABAN 2.5 MG PO TABS
2.5000 mg | ORAL_TABLET | Freq: Two times a day (BID) | ORAL | Status: DC
  Administered 2021-04-19 – 2021-04-28 (×18): 2.5 mg via ORAL
  Filled 2021-04-19 (×18): qty 1

## 2021-04-19 MED ORDER — SODIUM BICARBONATE 650 MG PO TABS
650.0000 mg | ORAL_TABLET | Freq: Two times a day (BID) | ORAL | Status: DC
  Administered 2021-04-19 – 2021-04-28 (×19): 650 mg via ORAL
  Filled 2021-04-19 (×19): qty 1

## 2021-04-19 MED ORDER — IPRATROPIUM-ALBUTEROL 20-100 MCG/ACT IN AERS
1.0000 | INHALATION_SPRAY | Freq: Two times a day (BID) | RESPIRATORY_TRACT | Status: DC
  Administered 2021-04-20 – 2021-04-28 (×16): 1 via RESPIRATORY_TRACT

## 2021-04-19 NOTE — TOC Progression Note (Signed)
Transition of Care [facility]) - Progression Note    Patient Details  Name: Kevin Schwartz MRN: [id number] Date of Birth: 1930/09/07  Transition of Care [facility]) CM/SW [place], LCSW Phone Number: 04/19/2021, 2:47 PM  Clinical Narrative:    CSW spoke with patient's son and provided SNF overview. He is agreeable to short term rehab. COVID + status is current barrier to placement.    Expected Discharge Plan: Skilled Nursing Facility Barriers to Discharge: SNF Covid  Expected Discharge Plan and Services Expected Discharge Plan: [place] In-house Referral: Clinical Social Work   Post Acute Care Choice: [place] Living arrangements for the past 2 months: Hotel/Motel                                       Social Determinants of Health (SDOH) Interventions    Readmission Risk Interventions No flowsheet data found.

## 2021-04-19 NOTE — Consult Note (Signed)
Reason for Consult: Renal failure Referring Physician:  Dr. [place] Pink  Chief Complaint: confusion  Assessment/Plan: Acute kidney injury on CKD3a followed by [facility] (Dr. Jimmy Footman) at one point with kidney disease thought to be secondary to diabetes and hypertensive nephrosclerosis. He's had 3+ proteinuria before this current hospitalization.  - Urine microscopy not c/w ATN.; only 1-2 WBC/HPF and no RBC's with no pigmented casts. - AKI more likely to be hemodynamically mediated. - Will check urine lytes, renal ultrasound to r/o obstruction especially given his age. - Will start HCO3 at 1 tab BID to start.  - He was positive and appeared to be in failure leading to another dose of Lasix; will monitor response.  - Agree with holding Wilder Glade given the renal dysfunction.  -Monitor Daily I/Os, Daily weight  -Maintain MAP>65 for optimal renal perfusion.  -Avoid nephrotoxic medications including NSAIDs - Dose medications for GFR <30 for now.  COVID PNA on steroids and Remdesivir with improving CRP trends weaned off supplemental O2. HFrEF 45-50% with lower extremity edema which he states has been present for a few weeks now; he claims this was not present a few months ago. Hyperkalemia secondary to renal dysfunction - mild grade given Lokelma CASHD appears to be stable. DM   HPI: Kevin Schwartz is an 88 y.o. male HTN, HLD, BPH, DM, CASHD s/p PCI on Plavix and Eliquis, MGUS, OSA, CHF with EF 45-50%, CKD 3a followed by [facility] presenting with a cough which eventually became productive of yellowish sputum associated with shortness of breath. He denies chest pain, fever, chills. Patient has had worsening leg swelling as well. He tested positive for COVID Saturday but unfortunately patient because confused leading to transport to the ED. CXR showed LUL consolidation and changes compatible with a viral pneumonia. Patient was started on ciftriaxone and remdesivir + steroids. Patient was also on Farxiga at  home. Patient received lasix 20mg  on 1/4 and then 60mg  on 1/6. Patient is positive 1.4L during this hospitalization. Urinalysis showed >300 mg/dl which has been present since 07/2020, microscopy was not active. BUN/Cr was 37/1.65 at admission and has since steadily increased.  BL Cr appears to be 1.3-1.5.   ROS Pertinent items are noted in HPI.  Chemistry and CBC: Creatinine  Date/Time Value Ref Range Status  11/16/2018 12:45 PM 1.30 (H) 0.61 - 1.24 mg/dL Final  10/11/2013 11:15 AM 1.40 (H) 0.50 - 1.35 mg/dL Final  10/06/2013 10:16 AM 1.4 (H) 0.7 - 1.3 mg/dL Final  07/26/2012 11:16 AM 1.65 (H) 0.50 - 1.35 mg/dL Final  07/23/2012 10:31 AM 1.7 (H) 0.7 - 1.3 mg/dL Final  07/22/2007 09:06 AM 1.52 (H) 0.40 - 1.50 mg/dL Final   Creat  Date/Time Value Ref Range Status  04/11/2021 11:27 AM 2.04 (H) 0.70 - 1.22 mg/dL Final  02/26/2021 10:00 AM 1.55 (H) 0.70 - 1.22 mg/dL Final  08/07/2020 12:00 AM 1.38 (H) 0.70 - 1.11 mg/dL Final    Comment:    For patients >30 years of age, the reference limit for Creatinine is approximately 13% higher for people identified as African-American. .   06/13/2020 09:33 AM 1.20 (H) 0.70 - 1.11 mg/dL Final    Comment:    For patients >73 years of age, the reference limit for Creatinine is approximately 13% higher for people identified as African-American. Kevin Schwartz   09/02/2019 09:54 AM 1.42 (H) 0.70 - 1.11 mg/dL Final    Comment:    For patients >50 years of age, the reference limit for Creatinine is approximately 13% higher  for people identified as African-American. Kevin Schwartz   05/05/2019 10:05 AM 1.48 (H) 0.70 - 1.11 mg/dL Final    Comment:    For patients >53 years of age, the reference limit for Creatinine is approximately 13% higher for people identified as African-American. Kevin Schwartz   11/08/2018 04:23 PM 1.50 (H) 0.70 - 1.11 mg/dL Final    Comment:    For patients >77 years of age, the reference limit for Creatinine is approximately 13% higher for  people identified as African-American. .   06/28/2018 03:57 PM 1.88 (H) 0.70 - 1.11 mg/dL Final    Comment:    For patients >62 years of age, the reference limit for Creatinine is approximately 13% higher for people identified as African-American. .   01/25/2018 11:50 AM 1.26 (H) 0.70 - 1.11 mg/dL Final    Comment:    For patients >67 years of age, the reference limit for Creatinine is approximately 13% higher for people identified as African-American. Kevin Schwartz   09/10/2017 09:47 AM 1.46 (H) 0.70 - 1.11 mg/dL Final    Comment:    For patients >47 years of age, the reference limit for Creatinine is approximately 13% higher for people identified as African-American. Kevin Schwartz   05/07/2017 09:47 AM 1.72 (H) 0.70 - 1.11 mg/dL Final    Comment:    For patients >54 years of age, the reference limit for Creatinine is approximately 13% higher for people identified as African-American. Kevin Schwartz   09/18/2016 08:48 AM 1.49 (H) 0.70 - 1.11 mg/dL Final    Comment:      For patients > or = 86 years of age: The upper reference limit for Creatinine is approximately 13% higher for people identified as African-American.     06/16/2016 02:30 PM 1.31 (H) 0.70 - 1.11 mg/dL Final    Comment:      For patients > or = 86 years of age: The upper reference limit for Creatinine is approximately 13% higher for people identified as African-American.     01/14/2016 12:26 PM 1.27 (H) 0.70 - 1.11 mg/dL Final    Comment:      For patients > or = 86 years of age: The upper reference limit for Creatinine is approximately 13% higher for people identified as African-American.      Creatinine, Ser  Date/Time Value Ref Range Status  04/19/2021 02:14 AM 2.36 (H) 0.61 - 1.24 mg/dL Final  04/18/2021 01:21 AM 1.94 (H) 0.61 - 1.24 mg/dL Final  04/17/2021 04:59 AM 1.65 (H) 0.61 - 1.24 mg/dL Final  04/16/2021 02:50 PM 1.66 (H) 0.61 - 1.24 mg/dL Final  10/29/2020 12:57 PM 1.37 (H) 0.61 - 1.24 mg/dL Final  08/03/2020  01:48 AM 1.18 0.61 - 1.24 mg/dL Final  08/02/2020 06:38 AM 1.29 (H) 0.61 - 1.24 mg/dL Final  07/31/2020 03:40 PM 1.30 (H) 0.61 - 1.24 mg/dL Final  07/27/2020 12:15 PM 1.54 (H) 0.61 - 1.24 mg/dL Final  06/06/2016 05:07 PM 1.45 (H) 0.61 - 1.24 mg/dL Final  04/30/2015 10:17 AM 1.63 (H) 0.76 - 1.27 mg/dL Final  09/22/2014 09:58 AM 1.28 (H) 0.76 - 1.27 mg/dL Final  06/09/2014 09:03 AM 1.55 (H) 0.76 - 1.27 mg/dL Final  02/16/2014 10:55 AM 1.57 (H) 0.76 - 1.27 mg/dL Final  07/14/2013 11:23 AM 1.67 (H) 0.76 - 1.27 mg/dL Final  06/15/2013 08:04 AM 1.79 (H) 0.50 - 1.35 mg/dL Final  02/14/2013 10:01 AM 1.43 (H) 0.50 - 1.35 mg/dL Final  12/27/2012 08:00 AM 1.29 0.50 - 1.35 mg/dL Final  12/26/2012 05:15 AM  1.45 (H) 0.50 - 1.35 mg/dL Final  12/25/2012 08:46 PM 1.57 (H) 0.50 - 1.35 mg/dL Final  11/25/2012 09:19 AM 1.49 (H) 0.76 - 1.27 mg/dL Final  08/25/2012 11:40 AM 1.56 (H) 0.76 - 1.27 mg/dL Final  07/25/2011 02:28 PM 1.49 (H) 0.50 - 1.35 mg/dL Final  10/23/2010 09:57 AM 1.6 (H) 0.4 - 1.5 mg/dL Final  09/10/2010 11:32 AM 1.71 (H) 0.4 - 1.5 mg/dL Final  07/01/2010 01:29 PM 1.36 0.40 - 1.50 mg/dL Final  07/02/2009 09:02 AM 1.94 (H) 0.40 - 1.50 mg/dL Final  07/04/2008 09:48 AM 1.69 (H) 0.40 - 1.50 mg/dL Final  07/01/2006 09:19 AM 1.61 (H) 0.40 - 1.50 mg/dL Final   Recent Labs  Lab 04/16/21 1450 04/16/21 2005 04/17/21 0459 04/18/21 0121 04/19/21 0214  NA 134* 136 134* 130* 129*  K 4.5 5.1 4.6 5.0 5.3*  CL 104  --  105 105 102  CO2 18*  --  18* 18* 15*  GLUCOSE 72  --  105* 205* 107*  BUN 34*  --  37* 47* 59*  CREATININE 1.66*  --  1.65* 1.94* 2.36*  CALCIUM 8.6*  --  7.7* 7.6* 7.6*  PHOS  --   --  3.5  --   --    Recent Labs  Lab 04/16/21 1450 04/16/21 2005 04/19/21 0214  WBC 11.5*  --  18.3*  NEUTROABS 10.5*  --   --   HGB 15.8 15.0 12.4*  HCT 46.4 44.0 36.6*  MCV 90.1  --  88.0  PLT 225  --  269   Liver Function Tests: Recent Labs  Lab 04/16/21 1450  AST 62*  ALT 30   ALKPHOS 83  BILITOT 2.4*  PROT 7.8  ALBUMIN 3.2*   No results for input(s): LIPASE, AMYLASE in the last 168 hours. No results for input(s): AMMONIA in the last 168 hours. Cardiac Enzymes: No results for input(s): CKTOTAL, CKMB, CKMBINDEX, TROPONINI in the last 168 hours. Iron Studies:  Recent Labs    04/17/21 0459  FERRITIN 286   PT/INR: @LABRCNTIP (inr:5)  Xrays/Other Studies: ) Results for orders placed or performed during the hospital encounter of 04/16/21 (from the past 48 hour(s))  CBG monitoring, ED     Status: Abnormal   Collection Time: 04/17/21  5:16 PM  Result Value Ref Range   Glucose-Capillary 126 (H) 70 - 99 mg/dL    Comment: Glucose reference range applies only to samples taken after fasting for at least 8 hours.  Glucose, capillary     Status: Abnormal   Collection Time: 04/17/21 10:34 PM  Result Value Ref Range   Glucose-Capillary 161 (H) 70 - 99 mg/dL    Comment: Glucose reference range applies only to samples taken after fasting for at least 8 hours.  Basic metabolic panel     Status: Abnormal   Collection Time: 04/18/21  1:21 AM  Result Value Ref Range   Sodium 130 (L) 135 - 145 mmol/L   Potassium 5.0 3.5 - 5.1 mmol/L   Chloride 105 98 - 111 mmol/L   CO2 18 (L) 22 - 32 mmol/L   Glucose, Bld 205 (H) 70 - 99 mg/dL    Comment: Glucose reference range applies only to samples taken after fasting for at least 8 hours.   BUN 47 (H) 8 - 23 mg/dL   Creatinine, Ser 1.94 (H) 0.61 - 1.24 mg/dL   Calcium 7.6 (L) 8.9 - 10.3 mg/dL   GFR, Estimated 33 (L) >60 mL/min    Comment: (NOTE)  Calculated using the CKD-EPI Creatinine Equation (2021)    Anion gap 7 5 - 15    Comment: Performed at [facility], [place] [address] [place], [place] [address]  C-reactive protein     Status: Abnormal   Collection Time: 04/18/21  1:21 AM  Result Value Ref Range   CRP 10.1 (H) <1.0 mg/dL    Comment: Performed at [facility] [address] [place], [place] [address]   Glucose, capillary     Status: Abnormal   Collection Time: 04/18/21  8:48 AM  Result Value Ref Range   Glucose-Capillary 144 (H) 70 - 99 mg/dL    Comment: Glucose reference range applies only to samples taken after fasting for at least 8 hours.  Glucose, capillary     Status: None   Collection Time: 04/18/21  1:04 PM  Result Value Ref Range   Glucose-Capillary 97 70 - 99 mg/dL    Comment: Glucose reference range applies only to samples taken after fasting for at least 8 hours.  Glucose, capillary     Status: Abnormal   Collection Time: 04/18/21  4:26 PM  Result Value Ref Range   Glucose-Capillary 231 (H) 70 - 99 mg/dL    Comment: Glucose reference range applies only to samples taken after fasting for at least 8 hours.  Glucose, capillary     Status: None   Collection Time: 04/18/21  9:08 PM  Result Value Ref Range   Glucose-Capillary 91 70 - 99 mg/dL    Comment: Glucose reference range applies only to samples taken after fasting for at least 8 hours.  Basic metabolic panel     Status: Abnormal   Collection Time: 04/19/21  2:14 AM  Result Value Ref Range   Sodium 129 (L) 135 - 145 mmol/L   Potassium 5.3 (H) 3.5 - 5.1 mmol/L   Chloride 102 98 - 111 mmol/L   CO2 15 (L) 22 - 32 mmol/L   Glucose, Bld 107 (H) 70 - 99 mg/dL    Comment: Glucose reference range applies only to samples taken after fasting for at least 8 hours.   BUN 59 (H) 8 - 23 mg/dL   Creatinine, Ser 2.36 (H) 0.61 - 1.24 mg/dL   Calcium 7.6 (L) 8.9 - 10.3 mg/dL   GFR, Estimated 26 (L) >60 mL/min    Comment: (NOTE) Calculated using the CKD-EPI Creatinine Equation (2021)    Anion gap 12 5 - 15    Comment: Performed at [facility] [address] [place], [place] [address]  C-reactive protein     Status: Abnormal   Collection Time: 04/19/21  2:14 AM  Result Value Ref Range   CRP 7.2 (H) <1.0 mg/dL    Comment: Performed at [facility] [address], [place], [place] [address]  CBC     Status:  Abnormal   Collection Time: 04/19/21  2:14 AM  Result Value Ref Range   WBC 18.3 (H) 4.0 - 10.5 K/uL   RBC 4.16 (L) 4.22 - 5.81 MIL/uL   Hemoglobin 12.4 (L) 13.0 - 17.0 g/dL   HCT 36.6 (L) 39.0 - 52.0 %   MCV 88.0 80.0 - 100.0 fL   MCH 29.8 26.0 - 34.0 pg   MCHC 33.9 30.0 - 36.0 g/dL   RDW 17.1 (H) 11.5 - 15.5 %   Platelets 269 150 - 400 K/uL   nRBC 0.2 0.0 - 0.2 %    Comment: Performed at [facility], [place] [address] [place], [place] [address]  Cortisol-am, blood     Status: None   Collection Time: 04/19/21  2:14 AM  Result Value Ref Range   Cortisol - AM 8.9 6.7 - 22.6 ug/dL    Comment: Performed at [facility], [place] [address], [place], [place] [address]  Brain natriuretic peptide     Status: Abnormal   Collection Time: 04/19/21  2:14 AM  Result Value Ref Range   B Natriuretic Peptide 427.6 (H) 0.0 - 100.0 pg/mL    Comment: Performed at [facility] [address] [place], [place] [address]  Glucose, capillary     Status: None   Collection Time: 04/19/21  8:51 AM  Result Value Ref Range   Glucose-Capillary 94 70 - 99 mg/dL    Comment: Glucose reference range applies only to samples taken after fasting for at least 8 hours.  Glucose, capillary     Status: Abnormal   Collection Time: 04/19/21 12:12 PM  Result Value Ref Range   Glucose-Capillary 125 (H) 70 - 99 mg/dL    Comment: Glucose reference range applies only to samples taken after fasting for at least 8 hours.   DG CHEST PORT 1 VIEW  Result Date: 04/18/2021 CLINICAL DATA:  Hypoxia, rapid response EXAM: PORTABLE CHEST 1 VIEW COMPARISON:  04/17/2021 FINDINGS: Single frontal view of the chest demonstrates stable enlargement of the cardiac silhouette. Wedge-shaped consolidation within the left mid lung zone again noted and unchanged. Overall increase in the interstitial and ground-glass opacities elsewhere throughout the lungs, greatest at the bases. No effusion or pneumothorax. No acute bony abnormalities.  IMPRESSION: 1. Persistent dense consolidation within the left midlung zone, which may reflect pneumonia, infarct, or neoplasm. Please see previous CT exam. 2. Worsening interstitial and ground-glass opacities throughout the lungs, compatible with progressive pneumonia or worsening edema. Electronically Signed   By: Randa Ngo M.D.   On: 04/18/2021 21:07   VAS [place] LOWER EXTREMITY VENOUS (DVT)  Result Date: 04/17/2021  Lower Venous DVT Study Patient Name:  EDWEN MCLESTER [facility]  Date of Exam:   04/17/2021 Medical Rec #: [id number]                Accession #:    [id number] Date of Birth: [birth year]                 Patient Gender: M Patient Age:   33 years Exam Location:  [facility] Procedure:      VAS [place] LOWER EXTREMITY VENOUS (DVT) Referring Phys: Bonnielee Haff --------------------------------------------------------------------------------  Indications: Swelling.  Risk Factors: None identified. Limitations: Body habitus, poor ultrasound/tissue interface and patient positioning. Comparison Study: No prior studies. Performing Technologist: Oliver Hum RVT  Examination Guidelines: A complete evaluation includes B-mode imaging, spectral Doppler, color Doppler, and power Doppler as needed of all accessible portions of each vessel. Bilateral testing is considered an integral part of a complete examination. Limited examinations for reoccurring indications may be performed as noted. The reflux portion of the exam is performed with the patient in reverse Trendelenburg.  +---------+---------------+---------+-----------+----------+--------------+  RIGHT     Compressibility Phasicity Spontaneity Properties Thrombus Aging  +---------+---------------+---------+-----------+----------+--------------+  CFV       Full            Yes       Yes                                    +---------+---------------+---------+-----------+----------+--------------+  SFJ       Full                                                              +---------+---------------+---------+-----------+----------+--------------+  FV Prox   Full                                                             +---------+---------------+---------+-----------+----------+--------------+  FV Mid    Full                                                             +---------+---------------+---------+-----------+----------+--------------+  FV Distal Full                                                             +---------+---------------+---------+-----------+----------+--------------+  PFV       Full                                                             +---------+---------------+---------+-----------+----------+--------------+  POP       Full            Yes       Yes                                    +---------+---------------+---------+-----------+----------+--------------+  PTV       Full                                                             +---------+---------------+---------+-----------+----------+--------------+  PERO      Full                                                             +---------+---------------+---------+-----------+----------+--------------+   +---------+---------------+---------+-----------+----------+--------------+  LEFT      Compressibility Phasicity Spontaneity Properties Thrombus Aging  +---------+---------------+---------+-----------+----------+--------------+  CFV       Full            Yes       Yes                                    +---------+---------------+---------+-----------+----------+--------------+  SFJ       Full                                                             +---------+---------------+---------+-----------+----------+--------------+  FV Prox   Full                                                             +---------+---------------+---------+-----------+----------+--------------+  FV Mid    Full                                                              +---------+---------------+---------+-----------+----------+--------------+  FV Distal Full            Yes       Yes                                    +---------+---------------+---------+-----------+----------+--------------+  PFV       Full                                                             +---------+---------------+---------+-----------+----------+--------------+  POP       Full            Yes       Yes                                    +---------+---------------+---------+-----------+----------+--------------+  PTV       Full                                                             +---------+---------------+---------+-----------+----------+--------------+  PERO      Full                                                             +---------+---------------+---------+-----------+----------+--------------+     Summary: RIGHT: - There is no evidence of deep vein thrombosis in the lower extremity. However, portions of this examination were limited- see technologist comments above.  - No cystic structure found in the popliteal fossa.  LEFT: - There is no evidence of deep vein thrombosis in the lower extremity. However, portions of this examination were limited- see  technologist comments above.  - No cystic structure found in the popliteal fossa.  *See table(s) above for measurements and observations. Electronically signed by Harold Barban MD on 04/17/2021 at 11:32:05 PM.    Final     PMH:   Past Medical History:  Diagnosis Date   Anemia due to chronic kidney disease    Bilateral inguinal hernia    CAD (coronary artery disease) cardiologist-  dr bensimhon   PTCA of OM in 1998, [place] in 2000, Cath 9/07 LM ok LAD ok. LCX 95% in OM prior to previous stent RCA. nondominant normal EF normal. [place] DES to OM 2007 (PLACED PROXIAMAL TO PREVIOUS STENT)   Chronic kidney disease, stage III (moderate) ([place])    nephrologist-  dr detarding   Depression    Diabetes mellitus type 2, diet-controlled ([place])     followed by pcp,  last A1c 5.2 on 09-10-2017 in epic   Gout, unspecified    10-29-2017  per pt stable   HLD (hyperlipidemia)    HTN (hypertension)    MGUS (monoclonal gammopathy of unknown significance) previously followed by dr Beryle Beams , [place] and released 08/01/2011   IgG kappa dx 2002 8% plasma cells in bone marrow; no lesions on bone X-rays;   Nocturia    OA (osteoarthritis)    OSA on CPAP    per study 01-30-2004  severe osa , AHI 51.6/hr   PAD (peripheral artery disease) ([place])    05-21-2006  left renal artery stenosis, s/p balloon angioplasty and stenting;  last duplex 02/ 2012  normal , arteries patent   S/P coronary artery stent placement    2000--  BMS x1  to OM;   2007-- DES x1  to OM proximal to previous stent   Secondary hyperparathyroidism of renal origin [facility])    Urgency of urination    Wears glasses     PSH:   Past Surgical History:  Procedure Laterality Date   CARDIOVASCULAR STRESS TEST  2010   per dr bensimhon epic note dated 04-18-2016  normal   CORONARY ANGIOPLASTY  1998   PTCA to OM   CORONARY ANGIOPLASTY WITH STENT PLACEMENT  2000   PCI and BMS x1 OM   CORONARY ANGIOPLASTY WITH STENT PLACEMENT  12-18-2005  dr Claiborne Billings   DES x1 to proximal to previously placed stent in OM   CORONARY BALLOON ANGIOPLASTY N/A 08/02/2020   Procedure: CORONARY BALLOON ANGIOPLASTY;  Surgeon: Troy Sine, MD;  Location: [facility];  Service: Cardiovascular;  Laterality: N/A;   CORONARY STENT INTERVENTION N/A 08/02/2020   Procedure: CORONARY STENT INTERVENTION;  Surgeon: Troy Sine, MD;  Location: [facility];  Service: Cardiovascular;  Laterality: N/A;   HERNIA REPAIR     INGUINAL HERNIA REPAIR Bilateral 10/30/2017   Procedure: LAPAROSCOPIC  BILATERAL INGUINAL HERNIA REPAIR  AND LEFT [place];  Surgeon: Michael Boston, MD;  Location: [facility];  Service: General;  Laterality: Bilateral;   INSERTION OF MESH Bilateral 10/30/2017    Procedure: INSERTION OF MESH;  Surgeon: Michael Boston, MD;  Location: [facility];  Service: General;  Laterality: Bilateral;   LEFT HEART CATH AND CORONARY ANGIOGRAPHY N/A 08/01/2020   Procedure: LEFT HEART CATH AND CORONARY ANGIOGRAPHY;  Surgeon: Troy Sine, MD;  Location: [facility];  Service: Cardiovascular;  Laterality: N/A;   RENAL ANGIOPLASTY Left 05-21-2006   dr berry   and stenting   TOTAL KNEE ARTHROPLASTY Left 07-22-2005  dr Shellia Carwin  [facility]   TRANSTHORACIC ECHOCARDIOGRAM  04/18/2016   ef 70-62%, grade 1 diastolic dysfunction/  mild to moderate AR/  mild MR    Allergies:  Allergies  Allergen Reactions   Lisinopril Swelling and Other (See Comments)    Angioedema    Losartan Potassium Swelling and Other (See Comments)    Angioedema    Crestor [Rosuvastatin] Swelling and Other (See Comments)    Angioedema    Tape Other (See Comments)    Irritates the skin    Medications:   Prior to Admission medications   Medication Sig Start Date End Date Taking? Authorizing Provider  allopurinol (ZYLOPRIM) 100 MG tablet Take 1 tablet (100 mg total) by mouth daily. 01/31/21  Yes [name], MD  amLODipine (NORVASC) 5 MG tablet Take 1 tablet (5 mg total) by mouth daily. 09/05/19  Yes Reed, Tiffany L, DO  apixaban (ELIQUIS) 5 MG TABS tablet Take 1 tablet (5 mg total) by mouth 2 (two) times daily. 10/24/20  Yes Almyra Deforest, PA  atorvastatin (LIPITOR) 80 MG tablet Take 1 tablet (80 mg total) by mouth daily. 01/31/21 05/01/21 Yes Ngetich, Dinah C, NP  citalopram (CELEXA) 20 MG tablet Take 1 tablet (20 mg total) by mouth daily. 01/31/21  Yes [name], MD  clopidogrel (PLAVIX) 75 MG tablet Take 1 tablet (75 mg total) by mouth daily with breakfast. 08/04/20 07/30/21 Yes Kayleen Memos, DO  dapagliflozin propanediol (FARXIGA) 5 MG TABS tablet Take 1 tablet (5 mg total) by mouth daily before breakfast. 10/29/20  Yes Bensimhon, Shaune Pascal, MD  ezetimibe (ZETIA) 10 MG  tablet Take 10 mg by mouth daily.   Yes [provider]  furosemide (LASIX) 20 MG tablet TAKE 1 TABLET BY MOUTH EVERY DAY Patient taking differently: Take 20 mg by mouth daily. 03/20/21  Yes [name], MD  lidocaine (LIDODERM) 5 % Place 1 patch onto the skin daily. Remove & Discard patch within 12 hours or as directed by MD 04/01/20  Yes Maudie Flakes, MD  nitroGLYCERIN (NITROSTAT) 0.4 MG SL tablet DISSOLVE 1 TABLET UNDER THE TONGUE EVERY 5 MINUTES AS  NEEDED FOR CHEST PAIN. MAX  OF 3 TABLETS IN 15 MINUTES. CALL 911 IF PAIN PERSISTS. Patient taking differently: Place 0.4 mg under the tongue every 5 (five) minutes as needed for chest pain. DISSOLVE 1 TABLET UNDER THE TONGUE EVERY 5 MINUTES AS  NEEDED FOR CHEST PAIN. MAX  OF 3 TABLETS IN 15 MINUTES. CALL 911 IF PAIN PERSISTS. 11/08/20  Yes [name], MD  potassium chloride SA (KLOR-CON M) 20 MEQ tablet Take 1 tablet (20 mEq total) by mouth daily. 04/11/21  Yes Ngetich, Dinah C, NP  ACCU-CHEK SOFTCLIX LANCETS lancets Use to test blood sugar daily. Dx:E11.8 03/25/17   Reed, Tiffany L, DO  Alcohol Swabs (B-D SINGLE USE SWABS REGULAR) PADS Use with testing of blood sugar. Dx:E11.8 03/25/17   Reed, Tiffany L, DO  glucose blood (ACCU-CHEK AVIVA PLUS) test strip Use to check blood sugar daily. Dx:E11.8 09/05/19   Reed, Tiffany L, DO  ULTRA-THIN II MINI PEN NEEDLE 31G X 5 MM MISC  08/29/14   [provider]    Discontinued Meds:   Medications Discontinued During This Encounter  Medication Reason   lactated ringers bolus 1,000 mL    azithromycin (ZITHROMAX) 500 mg in sodium chloride 0.9 % 250 mL IVPB    furosemide (LASIX) injection 20 mg    furosemide (LASIX) injection 20 mg  Ipratropium-Albuterol (COMBIVENT) respimat 1 puff    dapagliflozin propanediol (FARXIGA) tablet 5 mg    potassium chloride SA (KLOR-CON M) CR tablet 20 mEq    carvedilol (COREG) tablet 3.125 mg    0.9 %  sodium chloride infusion    apixaban  (ELIQUIS) tablet 5 mg     Social History:  reports that he has been smoking cigarettes. He has been smoking an average of .25 packs per day. He has never used smokeless tobacco. He reports that he does not currently use alcohol. He reports that he does not use drugs.  Family History:   Family History  Problem Relation Age of Onset   Heart disease Mother    Healthy Father    Heart disease Sister    Heart disease Sister    Cancer Sister        type unknown   Hypertension Sister    Diabetes Brother    Diabetes Brother    Diabetes Other    Coronary artery disease Other    Cancer Other     Blood pressure 130/74, pulse 91, temperature (!) 97.4 F (36.3 C), temperature source Axillary, resp. rate 20, height 5\' 7"  (1.702 m), weight 82.8 kg, SpO2 97 %. General appearance: no distress and slowed mentation Head: Normocephalic, without obvious abnormality, atraumatic Eyes: negative Back: symmetric, no curvature. ROM normal. No CVA tenderness. Resp: rales at the bases Chest wall: no tenderness Cardio: regular rate and rhythm GI: soft, non-tender; bowel sounds normal; no masses,  no organomegaly Extremities: edema 1+ Pulses: 2+ and symmetric       Stephaney Steven, Hunt Oris, MD 04/19/2021, 1:03 PM

## 2021-04-19 NOTE — Progress Notes (Signed)
°  Echocardiogram 2D Echocardiogram has been performed.  Kevin Schwartz 04/19/2021, 10:50 AM

## 2021-04-19 NOTE — Progress Notes (Signed)
Spoke with Kevin Schwartz regarding persistent hypothermia, bear hugger ordered or k pad. Bear hugger available and signed out of 4 N ICU and applied to pt will monitor temp closely, pt drowsy with cpap sleeping off and on. Iv fluids off due to hyperthermia and per md request for now

## 2021-04-19 NOTE — Progress Notes (Signed)
Physical Therapy Treatment Patient Details Name: Kevin Schwartz MRN: [id number] DOB: Jan 25, 1931 Today's Date: 04/19/2021   History of Present Illness 88 y/o male admitted 1/3 secondary to weakness and LE swelling. Pt also with covid PNA. RR called 1/5 for low body temp, improving now.  PMHx:  CAD, CHF, CKD, HTN, DM.    PT Comments    Pt is awake when PT arrives, able to interact with volume to instructions.  His effort for all ex is good but becomes SOB with all effort and exertion despite O2 sats not dropping below 99%.  Follow along with him to get greater strength in LE's, to increase his standing and walking tolerance as his condition permits, and increase endurance for being OOB as tolerated.  Pt is still recommended to SNF due to current limits of active independent movement and control of standing balance and gait ability.    Recommendations for follow up therapy are one component of a multi-disciplinary discharge planning process, led by the attending physician.  Recommendations may be updated based on patient status, additional functional criteria and insurance authorization.  Follow Up Recommendations  Skilled nursing-short term rehab (<3 hours/day)     Assistance Recommended at Discharge Frequent or constant Supervision/Assistance  Patient can return home with the following A lot of help with walking and/or transfers;A lot of help with bathing/dressing/bathroom;Assistance with cooking/housework;Assist for transportation;Help with stairs or ramp for entrance   Equipment Recommendations  Wheelchair (measurements PT);Wheelchair cushion (measurements PT)    Recommendations for Other Services       Precautions / Restrictions Precautions Precautions: Fall Restrictions Weight Bearing Restrictions: No     Mobility  Bed Mobility Overal bed mobility: Needs Assistance Bed Mobility: Rolling Rolling: Min assist         General bed mobility comments: pt was seen at bed  level per nsg request    Transfers                   General transfer comment: deferred per nsg    Ambulation/Gait                   Stairs             Wheelchair Mobility    Modified Rankin (Stroke Patients Only)       Balance                                            Cognition Arousal/Alertness: Awake/alert Behavior During Therapy: WFL for tasks assessed/performed Overall Cognitive Status: No family/caregiver present to determine baseline cognitive functioning                                 General Comments: dificult to determine cognition but is HOH, does fairly well with loud and simple commands        Exercises General Exercises - Lower Extremity Ankle Circles/Pumps: AAROM;5 reps Quad Sets: AROM;10 reps Gluteal Sets: AROM;10 reps Heel Slides: AROM;AAROM;10 reps Hip ABduction/ADduction: AAROM;AROM;10 reps Hip Flexion/Marching: AROM;10 reps    General Comments General comments (skin integrity, edema, etc.): pt was seen for weakness, did bed exercises with cues and reminders for technique.  Pt is able to follow simple instructions and requires repetition, unclear if hearing or cognition      Pertinent Vitals/Pain Pain Assessment: No/denies  pain    Home Living                          Prior Function            PT Goals (current goals can now be found in the care plan section) Acute Rehab PT Goals Patient Stated Goal: to go home Progress towards PT goals: Not progressing toward goals - comment    Frequency    Min 3X/week      PT Plan Current plan remains appropriate    Co-evaluation              AM-PAC PT "6 Clicks" Mobility   Outcome Measure  Help needed turning from your back to your side while in a flat bed without using bedrails?: A Lot Help needed moving from lying on your back to sitting on the side of a flat bed without using bedrails?: A Lot Help needed moving  to and from a bed to a chair (including a wheelchair)?: A Lot Help needed standing up from a chair using your arms (e.g., wheelchair or bedside chair)?: A Lot Help needed to walk in hospital room?: A Lot Help needed climbing 3-5 steps with a railing? : Total 6 Click Score: 11    End of Session Equipment Utilized During Treatment: Oxygen Activity Tolerance: Patient limited by fatigue;Other (comment) (O2 sats remained in 99% values but is SOB with exertion) Patient left: in bed;with call bell/phone within reach Nurse Communication: Mobility status PT Visit Diagnosis: Muscle weakness (generalized) (M62.81);Other symptoms and signs involving the nervous system (R29.898);Difficulty in walking, not elsewhere classified (R26.2);Other abnormalities of gait and mobility (R26.89)     Time: 0981-1914 PT Time Calculation (min) (ACUTE ONLY): 24 min  Charges:  $Therapeutic Exercise: 23-37 mins          Ramond Dial 04/19/2021, 1:54 PM  Mee Hives, PT PhD Acute Rehab Dept. Number: [place] [facility] [place]

## 2021-04-19 NOTE — Progress Notes (Signed)
[facility] PROGRESS NOTE   Kevin Schwartz PFX:[id number] DOB: [birth year] DOA: 04/16/2021  PCP: [name], MD  Brief History/Interval Summary: 88 y.o. male with medical history significant of CAD with stenting on Plavix and Eliquis regimen, chronic systolic CHF LVEF 32-99%, CKD stage IIIa, HTN, HLD, BPH, IIDM, presented with increasing shortness of breath and cough and leg swelling.  Patient tested positive for COVID-19.  Imaging studies showed left upper lobe consolidation and bilateral groundglass opacities.  Patient was hospitalized for further management.  Reason for Visit: Pneumonia due to COVID-19.  Acute systolic CHF  Consultants: None  Procedures: None    Subjective/Interval History: Overnight events noted.  Patient noted to be more dyspneic last night.  He was hypothermic as well.  Placed on a warming blanket.  He denies any significant shortness of breath this morning.  Denies any chest pain.  Overall he mentions that he is doing better.     Assessment/Plan:  Pneumonia due to COVID-19/sepsis, present on admission Patient had sepsis criteria on admission.  Noted to be tachypneic, tachycardic with elevated WBC.  He also had elevated lactic acid level. It is felt that his presentation was primarily due to COVID-19 with some concern for secondary bacterial infection.  Procalcitonin was 0.8. Patient is currently on Remdesivir, today is day 4 of 5.  Remains on ceftriaxone as well.  We will plan on 5-day course. Currently saturating in the mid 90s on room air.  Has mild hemoptysis which could be from pneumonia.  Patient does not have any symptoms or signs otherwise suggestive of thromboembolic process.   However his D-dimer is noted to be elevated which could from COVID-19.  A lower extremity Doppler study was negative for DVT.   CRP was elevated at 12.7.  Improved to 10.1 and then to 7.2 today. Continue with dexamethasone.  Acute respiratory failure with  hypoxia Initially due to pneumonia from COVID-19.  He was weaned off of oxygen yesterday but had to be placed back on oxygen yesterday evening.  Chest x-ray suggested worsening pulmonary edema.  Respiratory status stable this morning.  Acute on chronic systolic CHF EF is noted to be 45 to 50% on echocardiogram done in April 2022.  He was noted to have lower extremity edema.  He was given a low-dose of furosemide.  Had drop in his blood pressure. He had worsening renal function yesterday due to poor oral intake.  He was given some IV fluids due to low blood pressure.  Overnight he appeared to become fluid overloaded.  IV fluids were discontinued.  Blood pressure has stabilized.  Cortisol was 8.9. Renal function has worsened.  See below.  Patient to be given Lasix today.  Nephrology to be consulted Will repeat echocardiogram.  May need to involve cardiology as well.   Holding his cardiac medications due to low blood pressures.    Acute on chronic kidney disease stage IIIa/hyponatremia/hyperkalemia Baseline creatinine seems to be around 1.3-1.6.   Renal function worsening and up to 2.36 today.  He also has hyponatremia and hyperkalemia.  He will be given Lokelma. Discussed with nephrology who will consult.  He will be given Lasix as he appears to be volume overloaded today.  Blood pressure is stable.    Acute metabolic encephalopathy Likely due to COVID-19.  Seems to have resolved.  Back to baseline now.  History of coronary artery disease Stable.  Continue Plavix.  Diabetes mellitus type 2, with renal complications, with chronic kidney disease stage IIIa  He was noted to have hypoglycemia initially.  Wilder Glade was held.  CBGs have stabilized.  HbA1c 6.2.    History of a flutter, unspecified Followed by cardiology.  On Eliquis for stroke prevention.   DVT Prophylaxis: On Eliquis Code Status: Full code Family Communication: No family at bedside.  Discussed with patient Disposition Plan: SNF  recommended by physical therapy  Status is: Inpatient  Remains inpatient appropriate because: Acute on chronic systolic CHF, pneumonia due to COVID-19    Medications: Scheduled:  allopurinol  100 mg Oral Daily   apixaban  2.5 mg Oral BID   atorvastatin  80 mg Oral Daily   citalopram  20 mg Oral Daily   clopidogrel  75 mg Oral Q breakfast   dexamethasone (DECADRON) injection  6 mg Intravenous Q24H   ezetimibe  10 mg Oral Daily   furosemide  60 mg Intravenous Once   insulin aspart  0-9 Units Subcutaneous TID WC   Ipratropium-Albuterol  1 puff Inhalation Q4H   sodium chloride flush  3 mL Intravenous Q12H   Continuous:  sodium chloride     cefTRIAXone (ROCEPHIN)  IV 1 g (04/19/21 0917)   remdesivir 100 mg in NS 100 mL 100 mg (04/18/21 1004)   MBW:GYKZLD chloride, acetaminophen, guaiFENesin-dextromethorphan, ondansetron (ZOFRAN) IV, sodium chloride flush  Antibiotics: Anti-infectives (From admission, onward)    Start     Dose/Rate Route Frequency Ordered Stop   04/17/21 1000  remdesivir 100 mg in sodium chloride 0.9 % 100 mL IVPB       See Hyperspace for full Linked Orders Report.   100 mg 200 mL/hr over 30 Minutes Intravenous Daily 04/16/21 1640 04/21/21 0959   04/17/21 1000  cefTRIAXone (ROCEPHIN) 1 g in sodium chloride 0.9 % 100 mL IVPB        1 g 200 mL/hr over 30 Minutes Intravenous Every 24 hours 04/16/21 1734     04/16/21 2000  remdesivir 200 mg in sodium chloride 0.9% 250 mL IVPB       See Hyperspace for full Linked Orders Report.   200 mg 580 mL/hr over 30 Minutes Intravenous Once 04/16/21 1640 04/16/21 2039   04/16/21 1500  doxycycline (VIBRAMYCIN) 100 mg in sodium chloride 0.9 % 250 mL IVPB        100 mg 125 mL/hr over 120 Minutes Intravenous  Once 04/16/21 1448 04/16/21 1912   04/16/21 1445  cefTRIAXone (ROCEPHIN) 1 g in sodium chloride 0.9 % 100 mL IVPB        1 g 200 mL/hr over 30 Minutes Intravenous  Once 04/16/21 1441 04/16/21 1659   04/16/21 1445   azithromycin (ZITHROMAX) 500 mg in sodium chloride 0.9 % 250 mL IVPB  Status:  Discontinued        500 mg 250 mL/hr over 60 Minutes Intravenous  Once 04/16/21 1441 04/16/21 1448       Objective:  Vital Signs  Vitals:   04/19/21 0500 04/19/21 0555 04/19/21 0600 04/19/21 0846  BP:   121/61 130/74  Pulse: 91 92 93 91  Resp: 18 (!) 21 18 20   Temp:  97.7 F (36.5 C) (!) 96.5 F (35.8 C) (!) 97.4 F (36.3 C)  TempSrc:  Axillary Axillary Axillary  SpO2: 99% 100% 99% 97%  Weight:      Height:        Intake/Output Summary (Last 24 hours) at 04/19/2021 1045 Last data filed at 04/19/2021 0400 Gross per 24 hour  Intake 1179.83 ml  Output 320 ml  Net  859.83 ml    Filed Weights   04/18/21 0400 04/19/21 0437  Weight: 83.2 kg 82.8 kg    General appearance: Awake alert.  In no distress Resp: Mildly tachypneic.  Few crackles bilateral bases.  No wheezing or rhonchi. Cardio: S1-S2 is normal regular.  No S3-S4.  No rubs murmurs or bruit GI: Abdomen is soft.  Nontender nondistended.  Bowel sounds are present normal.  No masses organomegaly Extremities: 1+ edema bilateral lower extremities Neurologic:  No focal neurological deficits.     Lab Results:  Data Reviewed: I have personally reviewed following labs and imaging studies  CBC: Recent Labs  Lab 04/16/21 1450 04/16/21 2005 04/19/21 0214  WBC 11.5*  --  18.3*  NEUTROABS 10.5*  --   --   HGB 15.8 15.0 12.4*  HCT 46.4 44.0 36.6*  MCV 90.1  --  88.0  PLT 225  --  269     Basic Metabolic Panel: Recent Labs  Lab 04/16/21 1450 04/16/21 2005 04/17/21 0459 04/18/21 0121 04/19/21 0214  NA 134* 136 134* 130* 129*  K 4.5 5.1 4.6 5.0 5.3*  CL 104  --  105 105 102  CO2 18*  --  18* 18* 15*  GLUCOSE 72  --  105* 205* 107*  BUN 34*  --  37* 47* 59*  CREATININE 1.66*  --  1.65* 1.94* 2.36*  CALCIUM 8.6*  --  7.7* 7.6* 7.6*  MG 2.0  --  1.7  --   --   PHOS  --   --  3.5  --   --      GFR: Estimated Creatinine  Clearance: 22.3 mL/min (A) (by C-G formula based on SCr of 2.36 mg/dL (H)).  Liver Function Tests: Recent Labs  Lab 04/16/21 1450  AST 62*  ALT 30  ALKPHOS 83  BILITOT 2.4*  PROT 7.8  ALBUMIN 3.2*      Coagulation Profile: Recent Labs  Lab 04/16/21 1450  INR 1.1      HbA1C: Recent Labs    04/16/21 1957  HGBA1C 6.2*     CBG: Recent Labs  Lab 04/18/21 0848 04/18/21 1304 04/18/21 1626 04/18/21 2108 04/19/21 0851  GLUCAP 144* 97 231* 91 94     Anemia Panel: Recent Labs    04/16/21 1957 04/17/21 0459  FERRITIN 317 286     Recent Results (from the past 240 hour(s))  SARS-COV-2 RNA,(COVID-19) QUAL NAAT     Status: Abnormal   Collection Time: 04/11/21 11:17 AM  Result Value Ref Range Status   SARS CoV2 RNA DETECTED (A) NOT DETECTED Final    Comment: . A Detected result indicates that the patient's specimen was positive for SARS-CoV-2 RNA. [name] Test Method: Nucleic Acid Amplification Test including reverse transcription polymerase chain reaction (RT-PCR) and transcription mediated amplification (TMA). The test method meets the [place] Centers for Disease Control and prevention (CDC) pre departure and arrival requirement for viral test for COVID-19 dated May 12, 2019. Testing requirements for traveling may change with time. The patient is responsible for determining the test requirements for each nation while they are traveling. . This test has been authorized by the FDA under an  Emergency Use Authorization (EUA) for use by authorized laboratories. . Please review the "Fact Sheets" and FDA authorized labeling available for health care providers and patients using the following websites: https://www.[facility].com/home/Covid-19/HCP/NAAT/fact-sheet2  [email or web address]://[email or web address]/home/Covid-19/Patients/NAAT / fact-sheet2 . Due to the current public health emergency, [facility] is accepting samples from appropriate clinical sources  collected using  wide variety of swabs and transport media for COVID-19. Not detected test results derived from specimens received in non- commercially manufactured viral collection kits or those not yet authorized by FDA for COVID-19 testing should be cautiously evaluated and take extra precautions such as additional clinical monitoring, including collection of an additional specimen. . Additional information about COVID-19 can be found at the [employer] website: www.[email or web address].com/Covid19. . For patients with a Detected or Inconclusive test result, please see CDC's COVID-19 Treatments and  Medications page located at  BroadcastLocal.cz- severe-illness.html for information on COVID-19 therapeutics. . For patients with a Not Detected test result, please see CDC's Vacci nes for COVID-19 page located at PhoneStatistics.is for information on COVID-19 vaccines.   Blood Culture (routine x 2)     Status: None (Preliminary result)   Collection Time: 04/16/21  2:17 PM   Specimen: Left Antecubital; Blood  Result Value Ref Range Status   Specimen Description LEFT ANTECUBITAL  Final   Special Requests   Final    BOTTLES DRAWN AEROBIC AND ANAEROBIC Blood Culture results may not be optimal due to an inadequate volume of blood received in culture bottles   Culture   Final    NO GROWTH 3 DAYS Performed at [facility], [place] [address] [place], [place] [address]    Report Status PENDING  Incomplete  Resp Panel by RT-PCR (Flu A&B, Covid) Nasopharyngeal Swab     Status: Abnormal   Collection Time: 04/16/21  2:54 PM   Specimen: Nasopharyngeal Swab; Nasopharyngeal(NP) swabs in vial transport medium  Result Value Ref Range Status   SARS Coronavirus 2 by RT PCR POSITIVE (A) NEGATIVE Final    Comment: (NOTE) SARS-CoV-2 target nucleic acids are DETECTED.  The SARS-CoV-2 RNA is generally  detectable in upper respiratory specimens during the acute phase of infection. Positive results are indicative of the presence of the identified virus, but do not rule out bacterial infection or co-infection with other pathogens not detected by the test. Clinical correlation with patient history and other diagnostic information is necessary to determine patient infection status. The expected result is Negative.  Fact Sheet for Patients: EntrepreneurPulse.com.au  Fact Sheet for Healthcare Providers: IncredibleEmployment.be  This test is not yet approved or cleared by the [place] FDA and  has been authorized for detection and/or diagnosis of SARS-CoV-2 by FDA under an Emergency Use Authorization (EUA).  This EUA will remain in effect (meaning this test can be used) for the duration of  the COVID-19 declaration under Section 564(b)(1) of the A ct, 21 U.S.C. section 360bbb-3(b)(1), unless the authorization is terminated or revoked sooner.     Influenza A by PCR NEGATIVE NEGATIVE Final   Influenza B by PCR NEGATIVE NEGATIVE Final    Comment: (NOTE) The Xpert Xpress SARS-CoV-2/FLU/RSV plus assay is intended as an aid in the diagnosis of influenza from Nasopharyngeal swab specimens and should not be used as a sole basis for treatment. Nasal washings and aspirates are unacceptable for Xpert Xpress SARS-CoV-2/FLU/RSV testing.  Fact Sheet for Patients: EntrepreneurPulse.com.au  Fact Sheet for Healthcare Providers: IncredibleEmployment.be  This test is not yet approved or cleared by the [place] FDA and has been authorized for detection and/or diagnosis of SARS-CoV-2 by FDA under an Emergency Use Authorization (EUA). This EUA will remain in effect (meaning this test can be used) for the duration of the COVID-19 declaration under Section 564(b)(1) of the Act, 21 U.S.C. section 360bbb-3(b)(1), unless the  authorization is terminated or revoked.  Performed at  [facility], [place] [address], [place], [place] [address]        Radiology Studies: DG CHEST PORT 1 VIEW  Result Date: 04/18/2021 CLINICAL DATA:  Hypoxia, rapid response EXAM: PORTABLE CHEST 1 VIEW COMPARISON:  04/17/2021 FINDINGS: Single frontal view of the chest demonstrates stable enlargement of the cardiac silhouette. Wedge-shaped consolidation within the left mid lung zone again noted and unchanged. Overall increase in the interstitial and ground-glass opacities elsewhere throughout the lungs, greatest at the bases. No effusion or pneumothorax. No acute bony abnormalities. IMPRESSION: 1. Persistent dense consolidation within the left midlung zone, which may reflect pneumonia, infarct, or neoplasm. Please see previous CT exam. 2. Worsening interstitial and ground-glass opacities throughout the lungs, compatible with progressive pneumonia or worsening edema. Electronically Signed   By: Randa Ngo M.D.   On: 04/18/2021 21:07   VAS [place] LOWER EXTREMITY VENOUS (DVT)  Result Date: 04/17/2021  Lower Venous DVT Study Patient Name:  Kevin Schwartz [facility]  Date of Exam:   04/17/2021 Medical Rec #: [id number]                Accession #:    [id number] Date of Birth: 1931-02-20                 Patient Gender: M Patient Age:   49 years Exam Location:  [facility] Procedure:      VAS [place] LOWER EXTREMITY VENOUS (DVT) Referring Phys: Bonnielee Haff --------------------------------------------------------------------------------  Indications: Swelling.  Risk Factors: None identified. Limitations: Body habitus, poor ultrasound/tissue interface and patient positioning. Comparison Study: No prior studies. Performing Technologist: Oliver Hum RVT  Examination Guidelines: A complete evaluation includes B-mode imaging, spectral Doppler, color Doppler, and power Doppler as needed of all accessible portions of each vessel. Bilateral testing is considered  an integral part of a complete examination. Limited examinations for reoccurring indications may be performed as noted. The reflux portion of the exam is performed with the patient in reverse Trendelenburg.  +---------+---------------+---------+-----------+----------+--------------+  RIGHT     Compressibility Phasicity Spontaneity Properties Thrombus Aging  +---------+---------------+---------+-----------+----------+--------------+  CFV       Full            Yes       Yes                                    +---------+---------------+---------+-----------+----------+--------------+  SFJ       Full                                                             +---------+---------------+---------+-----------+----------+--------------+  FV Prox   Full                                                             +---------+---------------+---------+-----------+----------+--------------+  FV Mid    Full                                                             +---------+---------------+---------+-----------+----------+--------------+  FV Distal Full                                                             +---------+---------------+---------+-----------+----------+--------------+  PFV       Full                                                             +---------+---------------+---------+-----------+----------+--------------+  POP       Full            Yes       Yes                                    +---------+---------------+---------+-----------+----------+--------------+  PTV       Full                                                             +---------+---------------+---------+-----------+----------+--------------+  PERO      Full                                                             +---------+---------------+---------+-----------+----------+--------------+   +---------+---------------+---------+-----------+----------+--------------+  LEFT       Compressibility Phasicity Spontaneity Properties Thrombus Aging  +---------+---------------+---------+-----------+----------+--------------+  CFV       Full            Yes       Yes                                    +---------+---------------+---------+-----------+----------+--------------+  SFJ       Full                                                             +---------+---------------+---------+-----------+----------+--------------+  FV Prox   Full                                                             +---------+---------------+---------+-----------+----------+--------------+  FV Mid    Full                                                             +---------+---------------+---------+-----------+----------+--------------+  FV Distal Full            Yes       Yes                                    +---------+---------------+---------+-----------+----------+--------------+  PFV       Full                                                             +---------+---------------+---------+-----------+----------+--------------+  POP       Full            Yes       Yes                                    +---------+---------------+---------+-----------+----------+--------------+  PTV       Full                                                             +---------+---------------+---------+-----------+----------+--------------+  PERO      Full                                                             +---------+---------------+---------+-----------+----------+--------------+     Summary: RIGHT: - There is no evidence of deep vein thrombosis in the lower extremity. However, portions of this examination were limited- see technologist comments above.  - No cystic structure found in the popliteal fossa.  LEFT: - There is no evidence of deep vein thrombosis in the lower extremity. However, portions of this examination were limited- see technologist comments above.  - No cystic structure found in the popliteal  fossa.  *See table(s) above for measurements and observations. Electronically signed by Harold Barban MD on 04/17/2021 at 11:32:05 PM.    Final        LOS: 3 days   [facility] Pager on [email or web address]  04/19/2021, 10:45 AM

## 2021-04-19 NOTE — [place] FL2 (Signed)
[place] MEDICAID FL2 LEVEL OF CARE SCREENING TOOL     IDENTIFICATION  Patient Name: Kevin Schwartz Birthdate: 25-Oct-1930 Sex: male Admission Date (Current Location): 04/16/2021  [facility] and [place] Number:  Herbalist and Address:  [facility], [place] [address] [place], [place] [address]      Provider Number: [id number]  Attending Physician Name and Address:  Bonnielee Haff, MD  Relative Name and Phone Number:       Current Level of Care: Hospital Recommended Level of Care: [place] Prior Approval Number:    Date Approved/Denied:   PASRR Number: [id number]  Discharge Plan: SNF    Current Diagnoses: Patient Active Problem List   Diagnosis Date Noted   Acute CHF (congestive heart failure) ([place]) 04/16/2021   PNA (pneumonia) 04/16/2021   COVID-19    Elevated troponin    NSTEMI (non-ST elevated myocardial infarction) ([place]) 07/31/2020   Grieving 02/16/2020   Bilateral inguinal hernia s/p lap hernia repair w mesh 10/30/2017 10/30/2017   Left femoral hernia s/p lap hernia repair w mesh 10/30/2017 10/30/2017   Loss of weight 01/21/2016   Heme positive stool 01/21/2016   Heart murmur 08/18/2013   Chronic kidney disease, stage III (moderate) ([place]) 07/14/2013   Hyperlipidemia LDL goal < 100 07/14/2013   Essential hypertension, benign 07/14/2013   Type II or unspecified type diabetes mellitus with renal manifestations, not stated as uncontrolled(250.40) 07/14/2013   Insomnia 07/14/2013   Angioedema of lips 12/26/2012   MGUS (monoclonal gammopathy of unknown significance) 08/01/2011   Gout of right wrist 08/01/2011   S/P coronary artery stent placement 08/01/2011   DM type 2 causing CKD stage 2 ([place]) 08/01/2011   Bradycardia 10/15/2010   Mixed hyperlipidemia due to type 2 diabetes mellitus ([place]) 05/01/2010   Essential hypertension 05/01/2010   CAD, NATIVE VESSEL 05/01/2010   PVD 05/01/2010    Orientation  RESPIRATION BLADDER Height & Weight     Self, Time, Situation, Place  O2 (3L Nasal cannula) Incontinent Weight: 182 lb 8.7 oz (82.8 kg) Height:  5\' 7"  (170.2 cm)  BEHAVIORAL SYMPTOMS/MOOD NEUROLOGICAL BOWEL NUTRITION STATUS      Continent Diet (see dc summary)  AMBULATORY STATUS COMMUNICATION OF NEEDS Skin   Limited Assist Verbally Normal                       Personal Care Assistance Level of Assistance  Bathing, Feeding, Dressing Bathing Assistance: Maximum assistance Feeding assistance: Limited assistance Dressing Assistance: Limited assistance     Functional Limitations Info  Hearing, Sight Sight Info: Impaired Hearing Info: Impaired      SPECIAL CARE FACTORS FREQUENCY  PT (By licensed PT), OT (By licensed OT)     PT Frequency: 5x/week OT Frequency: 5x/week            Contractures Contractures Info: Not present    Additional Factors Info  Code Status, Allergies, Psychotropic, Insulin Sliding Scale Code Status Info: Full Allergies Info: Lisinopril, Losartan Potassium, Crestor (Rosuvastatin), Tape Psychotropic Info: Celexa Insulin Sliding Scale Info: see dc summary       Current Medications (04/19/2021):  This is the current hospital active medication list Current Facility-Administered Medications  Medication Dose Route Frequency Provider Last Rate Last Admin   0.9 %  sodium chloride infusion  250 mL Intravenous PRN Wynetta Fines T, MD       acetaminophen (TYLENOL) tablet 650 mg  650 mg Oral Q4H PRN Lequita Halt, MD  650 mg at 04/17/21 2221   allopurinol (ZYLOPRIM) tablet 100 mg  100 mg Oral Daily Wynetta Fines T, MD   100 mg at 04/19/21 0913   apixaban (ELIQUIS) tablet 2.5 mg  2.5 mg Oral BID Bonnielee Haff, MD       atorvastatin (LIPITOR) tablet 80 mg  80 mg Oral Daily Wynetta Fines T, MD   80 mg at 04/19/21 0912   cefTRIAXone (ROCEPHIN) 1 g in sodium chloride 0.9 % 100 mL IVPB  1 g Intravenous Q24H Bonnielee Haff, MD 200 mL/hr at 04/19/21 0917 1 g at  04/19/21 0917   citalopram (CELEXA) tablet 20 mg  20 mg Oral Daily Wynetta Fines T, MD   20 mg at 04/19/21 0913   clopidogrel (PLAVIX) tablet 75 mg  75 mg Oral Q breakfast Wynetta Fines T, MD   75 mg at 04/19/21 0912   dexamethasone (DECADRON) injection 6 mg  6 mg Intravenous Q24H Wynetta Fines T, MD   6 mg at 04/18/21 1636   ezetimibe (ZETIA) tablet 10 mg  10 mg Oral Daily Wynetta Fines T, MD   10 mg at 04/19/21 0912   guaiFENesin-dextromethorphan (ROBITUSSIN DM) 100-10 MG/5ML syrup 10 mL  10 mL Oral Q4H PRN Wynetta Fines T, MD   10 mL at 04/17/21 2221   insulin aspart (novoLOG) injection 0-9 Units  0-9 Units Subcutaneous TID WC Wynetta Fines T, MD   1 Units at 04/19/21 1322   Ipratropium-Albuterol (COMBIVENT) respimat 1 puff  1 puff Inhalation Q4H Bonnielee Haff, MD   1 puff at 04/19/21 1558   ondansetron (ZOFRAN) injection 4 mg  4 mg Intravenous Q6H PRN Wynetta Fines T, MD       remdesivir 100 mg in sodium chloride 0.9 % 100 mL IVPB  100 mg Intravenous Daily Bertis Ruddy, RPH 200 mL/hr at 04/19/21 1134 100 mg at 04/19/21 1134   sodium bicarbonate tablet 650 mg  650 mg Oral BID Dwana Melena, MD   650 mg at 04/19/21 1558   sodium chloride flush (NS) 0.9 % injection 3 mL  3 mL Intravenous Q12H Wynetta Fines T, MD   3 mL at 04/19/21 0914   sodium chloride flush (NS) 0.9 % injection 3 mL  3 mL Intravenous PRN Lequita Halt, MD         Discharge Medications: Please see discharge summary for a list of discharge medications.  Relevant Imaging Results:  Relevant Lab Results:   Additional Information SSN: [id number]. Pfizer vaccines 06/16/19, 07/08/19, 02/16/20. COVID + on 04/16/21  Benard Halsted, LCSW

## 2021-04-20 DIAGNOSIS — E1122 Type 2 diabetes mellitus with diabetic chronic kidney disease: Secondary | ICD-10-CM | POA: Diagnosis not present

## 2021-04-20 DIAGNOSIS — U071 COVID-19: Secondary | ICD-10-CM | POA: Diagnosis not present

## 2021-04-20 DIAGNOSIS — I5023 Acute on chronic systolic (congestive) heart failure: Secondary | ICD-10-CM | POA: Diagnosis not present

## 2021-04-20 DIAGNOSIS — N179 Acute kidney failure, unspecified: Secondary | ICD-10-CM | POA: Diagnosis not present

## 2021-04-20 LAB — URIC ACID: Uric Acid, Serum: 7.2 mg/dL (ref 3.7–8.6)

## 2021-04-20 LAB — BASIC METABOLIC PANEL
Anion gap: 16 — ABNORMAL HIGH (ref 5–15)
BUN: 66 mg/dL — ABNORMAL HIGH (ref 8–23)
CO2: 17 mmol/L — ABNORMAL LOW (ref 22–32)
Calcium: 7.8 mg/dL — ABNORMAL LOW (ref 8.9–10.3)
Chloride: 97 mmol/L — ABNORMAL LOW (ref 98–111)
Creatinine, Ser: 2.7 mg/dL — ABNORMAL HIGH (ref 0.61–1.24)
Glucose, Bld: 123 mg/dL — ABNORMAL HIGH (ref 70–99)
Potassium: 4.6 mmol/L (ref 3.5–5.1)
Sodium: 130 mmol/L — ABNORMAL LOW (ref 135–145)

## 2021-04-20 LAB — C-REACTIVE PROTEIN: CRP: 5.7 mg/dL — ABNORMAL HIGH (ref <1.0)

## 2021-04-20 LAB — GLUCOSE, CAPILLARY
Glucose-Capillary: 110 mg/dL — ABNORMAL HIGH (ref 70–99)
Glucose-Capillary: 127 mg/dL — ABNORMAL HIGH (ref 70–99)
Glucose-Capillary: 113 mg/dL — ABNORMAL HIGH (ref 70–99)
Glucose-Capillary: 170 mg/dL — ABNORMAL HIGH (ref 70–99)

## 2021-04-20 NOTE — Progress Notes (Signed)
Tele reported vtach event. Later changed to PVC 4 beats. Pt asymptomatic vitals taken. Dr. Milderd Meager aware no new orders   04/20/21 2215  Vitals  Temp (!) 97.4 F (36.3 C)  BP 131/70  Pulse Rate 81  ECG Heart Rate 70  Resp 15  MEWS COLOR  MEWS Score Color Green  Oxygen Therapy  SpO2 100 %  O2 Device Room Air  MEWS Score  MEWS Temp 0  MEWS Systolic 0  MEWS Pulse 0  MEWS RR 0  MEWS LOC 0  MEWS Score 0

## 2021-04-20 NOTE — Progress Notes (Signed)
[facility] Progress Note   88 y.o. male HTN, HLD, BPH, DM, CASHD s/p PCI on Plavix and Eliquis, MGUS, OSA, CHF with EF 45-50%, CKD 3a followed by [facility] presenting with a cough which eventually became productive of yellowish sputum associated with shortness of breath. He denies chest pain, fever, chills. Patient has had worsening leg swelling as well. He tested positive for COVID Saturday but unfortunately patient because confused leading to transport to the ED. CXR showed LUL consolidation and changes compatible with a viral pneumonia. Patient was started on ciftriaxone and remdesivir + steroids. Patient was also on Farxiga at home. Patient received lasix 20mg  on 1/4 and then 60mg  on 1/6. Patient is positive 1.4L during this hospitalization. Urinalysis showed >300 mg/dl which has been present since 07/2020, microscopy was not active. BUN/Cr was 37/1.65 at admission and has since steadily increased.  BL Cr appears to be 1.3-1.5.   Assessment/ Plan:   Acute kidney injury on CKD3a followed by [facility] (Dr. Jimmy Footman) at one point with kidney disease thought to be secondary to diabetes and hypertensive nephrosclerosis. He's had 3+ proteinuria before this current hospitalization.  - Urine microscopy not c/w ATN.; only 1-2 WBC/HPF and no RBC's with no pigmented casts and would think more likely to be hemodynamically mediated but FeNA suggestive of ATN.  - Renal ultrasound shows e/o kidney disease (pt w/ h/o CKD) but no e/o obstruction. - Started on  HCO3 at 1 tab BID; renal diet with fluid restriction. - He was positive and appeared to be in failure leading to another dose of Lasix; but poor response.  - Agree with holding Wilder Glade given the renal dysfunction. - Fortunately no absolute indication for RRT but renal function is worsening. Would hold Lasix for now and will evaluate tomorrow to challenge with higher dose if needed. He may not respond to the Lasix with ATN. ATN likely from COVID PNA.    -Monitor Daily I/Os, Daily weight  -Maintain MAP>65 for optimal renal perfusion.  -Avoid nephrotoxic medications including NSAIDs - Dose medications for GFR <30 for now.   COVID PNA on steroids and Remdesivir with improving CRP trends weaned off supplemental O2. HFrEF 45-50% with lower extremity edema which he states has been present for a few weeks now; he claims this was not present a few months ago. Hyperkalemia secondary to renal dysfunction - mild grade given Lokelma CASHD appears to be stable. DM  Subjective:   Dyspneic no worse than yesterday. Confused.   Objective:   BP 125/78 (BP Location: Right Arm)    Pulse 83    Temp 97.7 F (36.5 C) (Axillary)    Resp 15    Ht 5\' 7"  (1.702 m)    Wt 82.8 kg    SpO2 100%    BMI 28.59 kg/m   Intake/Output Summary (Last 24 hours) at 04/20/2021 0654 Last data filed at 04/19/2021 1957 Gross per 24 hour  Intake --  Output 200 ml  Net -200 ml   Weight change:   Physical Exam: General appearance: no distress but slowed mentation Head: NCAT Back: symmetric, no curvature. ROM normal. No CVA tenderness. Resp: rales at the bases but poor air movement Cardio: regular rate and rhythm GI: soft, non-tender; bowel sounds normal; no masses,  no organomegaly Extremities: edema tr Pulses: 2+ and symmetric  Imaging: US RENAL  Result Date: 04/19/2021 CLINICAL DATA:  Acute on chronic renal failure EXAM: RENAL / URINARY TRACT ULTRASOUND COMPLETE COMPARISON:  CT 09/30/2010 FINDINGS: Right Kidney: Renal measurements:  8.1 x 4.2 x 4.3 cm = volume: 77 mL. Cortex slightly echogenic. No mass or hydronephrosis. Left Kidney: Renal measurements: 9 x 4.3 x 4.7 cm = volume: 95.5 mL. Cortex is slightly echogenic. No mass or hydronephrosis. Bladder: Appears normal for degree of bladder distention. Other: Small amount of abdominal ascites. IMPRESSION: 1. Slightly echogenic kidneys consistent with medical renal disease. No hydronephrosis. 2. Small amount of ascites.  Electronically Signed   By: Donavan Foil M.D.   On: 04/19/2021 23:37   DG CHEST PORT 1 VIEW  Result Date: 04/18/2021 CLINICAL DATA:  Hypoxia, rapid response EXAM: PORTABLE CHEST 1 VIEW COMPARISON:  04/17/2021 FINDINGS: Single frontal view of the chest demonstrates stable enlargement of the cardiac silhouette. Wedge-shaped consolidation within the left mid lung zone again noted and unchanged. Overall increase in the interstitial and ground-glass opacities elsewhere throughout the lungs, greatest at the bases. No effusion or pneumothorax. No acute bony abnormalities. IMPRESSION: 1. Persistent dense consolidation within the left midlung zone, which may reflect pneumonia, infarct, or neoplasm. Please see previous CT exam. 2. Worsening interstitial and ground-glass opacities throughout the lungs, compatible with progressive pneumonia or worsening edema. Electronically Signed   By: Randa Ngo M.D.   On: 04/18/2021 21:07   ECHOCARDIOGRAM LIMITED  Result Date: 04/19/2021    ECHOCARDIOGRAM LIMITED REPORT   Patient Name:   Kevin Schwartz [facility] Date of Exam: 04/19/2021 Medical Rec #:  [id number]               Height:       67.0 in Accession #:    [id number]              Weight:       182.5 lb Date of Birth:  31-Mar-1931                BSA:          1.945 m Patient Age:    60 years                BP:           121/61 mmHg Patient Gender: M                       HR:           86 bpm. Exam Location:  Inpatient Procedure: Limited Echo, 3D Echo, Cardiac Doppler and Color Doppler Indications:    I50.40* Unspecified combined systolic (congestive) and diastolic                 (congestive) heart failure  History:        Patient has prior history of Echocardiogram examinations, most                 recent 08/01/2020. CHF, CAD, Abnormal ECG, Signs/Symptoms:Murmur                 and Chest Pain; Risk Factors:Diabetes and Hypertension. Covid                 positive.  Sonographer:    Roseanna Rainbow RDCS Referring Phys: [id number] [facility]  Sonographer Comments: Global longitudinal strain was attempted. IMPRESSIONS  1. Left ventricular ejection fraction, by estimation, is 45 to 50%. The left ventricle has mildly decreased function. The left ventricle demonstrates global hypokinesis.  2. Right ventricular systolic function is normal. The right ventricular size is normal. There is moderately elevated pulmonary artery systolic pressure.  3. Left atrial size  was moderately dilated.  4. The mitral valve is abnormal. Moderate to severe mitral valve regurgitation. No evidence of mitral stenosis.  5. Tricuspid valve regurgitation is moderate.  6. The aortic valve is tricuspid. There is moderate calcification of the aortic valve. There is moderate thickening of the aortic valve. Aortic valve regurgitation is mild. Aortic valve sclerosis/calcification is present, without any evidence of aortic stenosis.  7. The inferior vena cava is normal in size with greater than 50% respiratory variability, suggesting right atrial pressure of 3 mmHg. FINDINGS  Left Ventricle: Left ventricular ejection fraction, by estimation, is 45 to 50%. The left ventricle has mildly decreased function. The left ventricle demonstrates global hypokinesis. The left ventricular internal cavity size was normal in size. There is  no left ventricular hypertrophy. Right Ventricle: The right ventricular size is normal. No increase in right ventricular wall thickness. Right ventricular systolic function is normal. There is moderately elevated pulmonary artery systolic pressure. The tricuspid regurgitant velocity is 3.05 m/s, and with an assumed right atrial pressure of 15 mmHg, the estimated right ventricular systolic pressure is 09.3 mmHg. Left Atrium: Left atrial size was moderately dilated. Right Atrium: Right atrial size was normal in size. Pericardium: There is no evidence of pericardial effusion. Mitral Valve: The mitral valve is abnormal. There is moderate thickening of the mitral  valve leaflet(s). There is moderate calcification of the mitral valve leaflet(s). Moderate to severe mitral valve regurgitation. No evidence of mitral valve stenosis. MV peak gradient, 11.6 mmHg. The mean mitral valve gradient is 5.0 mmHg. Tricuspid Valve: The tricuspid valve is normal in structure. Tricuspid valve regurgitation is moderate . No evidence of tricuspid stenosis. Aortic Valve: The aortic valve is tricuspid. There is moderate calcification of the aortic valve. There is moderate thickening of the aortic valve. Aortic valve regurgitation is mild. Aortic regurgitation PHT measures 388 msec. Aortic valve sclerosis/calcification is present, without any evidence of aortic stenosis. Aortic valve mean gradient measures 4.5 mmHg. Aortic valve peak gradient measures 7.8 mmHg. Aortic valve area, by VTI measures 3.03 cm. Pulmonic Valve: The pulmonic valve was normal in structure. Pulmonic valve regurgitation is not visualized. No evidence of pulmonic stenosis. Aorta: The aortic root is normal in size and structure. Venous: The inferior vena cava is normal in size with greater than 50% respiratory variability, suggesting right atrial pressure of 3 mmHg. IAS/Shunts: No atrial level shunt detected by color flow Doppler. LEFT VENTRICLE PLAX 2D LVIDd:         5.00 cm LVIDs:         3.80 cm LV PW:         1.20 cm LV IVS:        1.00 cm LVOT diam:     2.10 cm      3D Volume EF: LV SV:         76           3D EF:        45 % LV SV Index:   39           LV EDV:       139 ml LVOT Area:     3.46 cm     LV ESV:       76 ml                             LV SV:        63 ml  LV  Volumes (MOD) LV vol d, MOD A2C: 113.0 ml LV vol d, MOD A4C: 111.0 ml LV vol s, MOD A2C: 67.2 ml LV vol s, MOD A4C: 60.7 ml LV SV MOD A2C:     45.8 ml LV SV MOD A4C:     111.0 ml LV SV MOD BP:      48.2 ml IVC IVC diam: 3.10 cm LEFT ATRIUM         Index LA diam:    4.80 cm 2.47 cm/m  AORTIC VALVE AV Area (Vmax):    3.23 cm AV Area (Vmean):   2.88 cm AV  Area (VTI):     3.03 cm AV Vmax:           139.50 cm/s AV Vmean:          96.950 cm/s AV VTI:            0.249 m AV Peak Grad:      7.8 mmHg AV Mean Grad:      4.5 mmHg LVOT Vmax:         130.00 cm/s LVOT Vmean:        80.700 cm/s LVOT VTI:          0.218 m LVOT/AV VTI ratio: 0.88 AI PHT:            388 msec  AORTA Ao Root diam: 3.20 cm Ao Asc diam:  3.50 cm MITRAL VALVE                  TRICUSPID VALVE MV Area (PHT): 3.60 cm       TR Peak grad:   37.2 mmHg MV Area VTI:   1.73 cm       TR Vmax:        305.00 cm/s MV Peak grad:  11.6 mmHg MV Mean grad:  5.0 mmHg       SHUNTS MV Vmax:       1.70 m/s       Systemic VTI:  0.22 m MV Vmean:      102.0 cm/s     Systemic Diam: 2.10 cm MV Decel Time: 211 msec MR Peak grad:    98.0 mmHg MR Mean grad:    62.0 mmHg MR Vmax:         495.00 cm/s MR Vmean:        367.0 cm/s MR PISA:         3.08 cm MR PISA Eff ROA: 24 mm MR PISA Radius:  0.70 cm MV E velocity: 160.00 cm/s MV A velocity: 128.00 cm/s MV E/A ratio:  1.25 Jenkins Rouge MD Electronically signed by Jenkins Rouge MD Signature Date/Time: 04/19/2021/4:47:16 PM    Final     Labs: BMET Recent Labs  Lab 04/16/21 1450 04/16/21 2005 04/17/21 0459 04/18/21 0121 04/19/21 0214 04/19/21 1414 04/20/21 0045  NA 134* 136 134* 130* 129*  --  130*  K 4.5 5.1 4.6 5.0 5.3*  --  4.6  CL 104  --  105 105 102  --  97*  CO2 18*  --  18* 18* 15*  --  17*  GLUCOSE 72  --  105* 205* 107*  --  123*  BUN 34*  --  37* 47* 59*  --  66*  CREATININE 1.66*  --  1.65* 1.94* 2.36*  --  2.70*  CALCIUM 8.6*  --  7.7* 7.6* 7.6*  --  7.8*  PHOS  --   --  3.5  --   --  4.5  --    CBC Recent Labs  Lab 04/16/21 1450 04/16/21 2005 04/19/21 0214  WBC 11.5*  --  18.3*  NEUTROABS 10.5*  --   --   HGB 15.8 15.0 12.4*  HCT 46.4 44.0 36.6*  MCV 90.1  --  88.0  PLT 225  --  269    Medications:     allopurinol  100 mg Oral Daily   apixaban  2.5 mg Oral BID   atorvastatin  80 mg Oral Daily   citalopram  20 mg Oral Daily    clopidogrel  75 mg Oral Q breakfast   dexamethasone (DECADRON) injection  6 mg Intravenous Q24H   ezetimibe  10 mg Oral Daily   insulin aspart  0-9 Units Subcutaneous TID WC   Ipratropium-Albuterol  1 puff Inhalation BID   sodium bicarbonate  650 mg Oral BID   sodium chloride flush  3 mL Intravenous Q12H      Otelia Santee, MD 04/20/2021, 6:54 AM

## 2021-04-20 NOTE — Progress Notes (Signed)
[facility] PROGRESS NOTE   Rigo Letts LZJ:[id number] DOB: 1931/04/06 DOA: 04/16/2021  PCP: [name], MD  Brief History/Interval Summary: 88 y.o. male with medical history significant of CAD with stenting on Plavix and Eliquis regimen, chronic systolic CHF LVEF 02-40%, CKD stage IIIa, HTN, HLD, BPH, IIDM, presented with increasing shortness of breath and cough and leg swelling.  Patient tested positive for COVID-19.  Imaging studies showed left upper lobe consolidation and bilateral groundglass opacities.  Patient was hospitalized for further management.  Reason for Visit: Pneumonia due to COVID-19.  Acute systolic CHF.  Acute kidney injury  Consultants: Nephrology  Procedures: None    Subjective/Interval History: Patient mentions that he had a better day yesterday.  Feels better this morning with less shortness of breath.  Denies any chest pain.  No nausea or vomiting.      Assessment/Plan:  Pneumonia due to COVID-19/sepsis, present on admission Patient had sepsis criteria on admission.  Noted to be tachypneic, tachycardic with elevated WBC.  He also had elevated lactic acid level. It is felt that his presentation was primarily due to COVID-19 with some concern for secondary bacterial infection.  Procalcitonin was 0.8. Patient is currently on Remdesivir, today is day 5 of 5.  Also getting a 5-day course of ceftriaxone.   Had mild hemoptysis which could be from pneumonia.  Patient does not have any symptoms or signs otherwise suggestive of thromboembolic process.  He is already on anticoagulation for a flutter. However his D-dimer is noted to be elevated which could from COVID-19.  A lower extremity Doppler study was negative for DVT.   CRP was elevated at 12.7 and then improved to 5.7 as of today.   Continue with dexamethasone.  Acute respiratory failure with hypoxia Initially due to pneumonia from COVID-19.  He was weaned off of oxygen but required  oxygen again 2 days ago likely from fluid overload.  Respiratory status is stable.   Currently on oxygen by nasal cannula 2 L/min.  Acute on chronic systolic CHF/moderate to severe mitral regurgitation EF is noted to be 45 to 50% on echocardiogram done in April 2022.  He was noted to have lower extremity edema.  He was given a low-dose of furosemide.  Had drop in his blood pressure. Echocardiogram was repeated since patient remains dyspneic with concern for fluid overload. Continues to show EF of 45 to 50% with global hypokinesis of the left ventricle.  Right ventricular function was normal.  Moderately elevated pulmonary artery systolic pressures were noted.  Moderate to severe MR was noted.  MR was mild on echocardiogram of April 2022.  Will need to be seen by cardiology but could be done in the outpatient setting. Blood pressure has stabilized.  Cortisol level was 8.9. Renal function worsened yesterday.  See below. Cardiac medications on hold due to low blood pressures.  Acute on chronic kidney disease stage IIIa/hyponatremia/hyperkalemia Baseline creatinine seems to be around 1.3-1.6.   Renal function worsening and up to 2.36 as of yesterday.  He also has hyponatremia and hyperkalemia.  He was given Lokelma. Potassium level has improved.  Creatinine noted to be slightly higher today at 2.7.  Bicarbonate level is 17. Patient was given furosemide yesterday.  Nephrology recommending holding furosemide today.  We will recheck labs tomorrow.  Monitor urine output.  Renal ultrasound does not show any hydronephrosis.  Acute metabolic encephalopathy Likely due to COVID-19.  Seems to have resolved.  Back to baseline now.  History of coronary  artery disease Stable.  Continue Plavix.  Diabetes mellitus type 2, with renal complications, with chronic kidney disease stage IIIa He was noted to have hypoglycemia initially.  Wilder Glade was held.  CBGs have stabilized.  HbA1c 6.2.    History of a flutter,  unspecified Followed by cardiology.  On Eliquis for stroke prevention.   DVT Prophylaxis: On Eliquis Code Status: Full code Family Communication: No family at bedside.  Discussed with patient Disposition Plan: SNF recommended by physical therapy  Status is: Inpatient  Remains inpatient appropriate because: Acute on chronic systolic CHF, pneumonia due to COVID-19    Medications: Scheduled:  allopurinol  100 mg Oral Daily   apixaban  2.5 mg Oral BID   atorvastatin  80 mg Oral Daily   citalopram  20 mg Oral Daily   clopidogrel  75 mg Oral Q breakfast   dexamethasone (DECADRON) injection  6 mg Intravenous Q24H   ezetimibe  10 mg Oral Daily   insulin aspart  0-9 Units Subcutaneous TID WC   Ipratropium-Albuterol  1 puff Inhalation BID   sodium bicarbonate  650 mg Oral BID   sodium chloride flush  3 mL Intravenous Q12H   Continuous:  sodium chloride     cefTRIAXone (ROCEPHIN)  IV 1 g (04/20/21 0927)   remdesivir 100 mg in NS 100 mL 100 mg (04/19/21 1134)   WJX:BJYNWG chloride, acetaminophen, guaiFENesin-dextromethorphan, ondansetron (ZOFRAN) IV, sodium chloride flush  Antibiotics: Anti-infectives (From admission, onward)    Start     Dose/Rate Route Frequency Ordered Stop   04/17/21 1000  remdesivir 100 mg in sodium chloride 0.9 % 100 mL IVPB       See Hyperspace for full Linked Orders Report.   100 mg 200 mL/hr over 30 Minutes Intravenous Daily 04/16/21 1640 04/21/21 0959   04/17/21 1000  cefTRIAXone (ROCEPHIN) 1 g in sodium chloride 0.9 % 100 mL IVPB        1 g 200 mL/hr over 30 Minutes Intravenous Every 24 hours 04/16/21 1734 04/22/21 0959   04/16/21 2000  remdesivir 200 mg in sodium chloride 0.9% 250 mL IVPB       See Hyperspace for full Linked Orders Report.   200 mg 580 mL/hr over 30 Minutes Intravenous Once 04/16/21 1640 04/16/21 2039   04/16/21 1500  doxycycline (VIBRAMYCIN) 100 mg in sodium chloride 0.9 % 250 mL IVPB        100 mg 125 mL/hr over 120 Minutes  Intravenous  Once 04/16/21 1448 04/16/21 1912   04/16/21 1445  cefTRIAXone (ROCEPHIN) 1 g in sodium chloride 0.9 % 100 mL IVPB        1 g 200 mL/hr over 30 Minutes Intravenous  Once 04/16/21 1441 04/16/21 1659   04/16/21 1445  azithromycin (ZITHROMAX) 500 mg in sodium chloride 0.9 % 250 mL IVPB  Status:  Discontinued        500 mg 250 mL/hr over 60 Minutes Intravenous  Once 04/16/21 1441 04/16/21 1448       Objective:  Vital Signs  Vitals:   04/20/21 0600 04/20/21 0754 04/20/21 0800 04/20/21 0900  BP: 125/78 138/82 (!) 130/97   Pulse: 83  82 82  Resp: 15  13 17   Temp: 97.7 F (36.5 C) (!) 97.3 F (36.3 C)    TempSrc: Axillary Axillary    SpO2: 100%  100% 96%  Weight:      Height:        Intake/Output Summary (Last 24 hours) at 04/20/2021 1031 Last data filed  at 04/19/2021 1957 Gross per 24 hour  Intake --  Output 200 ml  Net -200 ml    Filed Weights   04/18/21 0400 04/19/21 0437  Weight: 83.2 kg 82.8 kg    General appearance: Awake alert.  In no distress Resp: Less tachypneic today.  Coarse breath sounds bilaterally with few crackles at the bases.  No wheezing or rhonchi appreciated. Cardio: S1-S2 is normal regular.  No S3-S4.  No rubs murmurs or bruit GI: Abdomen is soft.  Nontender nondistended.  Bowel sounds are present normal.  No masses organomegaly Extremities:  Continues to have physical deconditioning.  1+ edema bilateral lower extremity though it seems better today compared to yesterday. Neurologic: Alert and oriented x3.  No focal neurological deficits.      Lab Results:  Data Reviewed: I have personally reviewed following labs and imaging studies  CBC: Recent Labs  Lab 04/16/21 1450 04/16/21 2005 04/19/21 0214  WBC 11.5*  --  18.3*  NEUTROABS 10.5*  --   --   HGB 15.8 15.0 12.4*  HCT 46.4 44.0 36.6*  MCV 90.1  --  88.0  PLT 225  --  269     Basic Metabolic Panel: Recent Labs  Lab 04/16/21 1450 04/16/21 2005 04/17/21 0459  04/18/21 0121 04/19/21 0214 04/19/21 1414 04/20/21 0045  NA 134* 136 134* 130* 129*  --  130*  K 4.5 5.1 4.6 5.0 5.3*  --  4.6  CL 104  --  105 105 102  --  97*  CO2 18*  --  18* 18* 15*  --  17*  GLUCOSE 72  --  105* 205* 107*  --  123*  BUN 34*  --  37* 47* 59*  --  66*  CREATININE 1.66*  --  1.65* 1.94* 2.36*  --  2.70*  CALCIUM 8.6*  --  7.7* 7.6* 7.6*  --  7.8*  MG 2.0  --  1.7  --   --   --   --   PHOS  --   --  3.5  --   --  4.5  --      GFR: Estimated Creatinine Clearance: 19.5 mL/min (A) (by C-G formula based on SCr of 2.7 mg/dL (H)).  Liver Function Tests: Recent Labs  Lab 04/16/21 1450  AST 62*  ALT 30  ALKPHOS 83  BILITOT 2.4*  PROT 7.8  ALBUMIN 3.2*      Coagulation Profile: Recent Labs  Lab 04/16/21 1450  INR 1.1      HbA1C: No results for input(s): HGBA1C in the last 72 hours.   CBG: Recent Labs  Lab 04/19/21 0851 04/19/21 1212 04/19/21 1649 04/19/21 1956 04/20/21 0757  GLUCAP 94 125* 124* 113* 110*     Anemia Panel: No results for input(s): VITAMINB12, FOLATE, FERRITIN, TIBC, IRON, RETICCTPCT in the last 72 hours.   Recent Results (from the past 240 hour(s))  SARS-COV-2 RNA,(COVID-19) QUAL NAAT     Status: Abnormal   Collection Time: 04/11/21 11:17 AM  Result Value Ref Range Status   SARS CoV2 RNA DETECTED (A) NOT DETECTED Final    Comment: . A Detected result indicates that the patient's specimen was positive for SARS-CoV-2 RNA. [name] Test Method: Nucleic Acid Amplification Test including reverse transcription polymerase chain reaction (RT-PCR) and transcription mediated amplification (TMA). The test method meets the [place] Centers for Disease Control and prevention (CDC) pre departure and arrival requirement for viral test for COVID-19 dated May 12, 2019. Testing requirements for  traveling may change with time. The patient is responsible for determining the test requirements for each nation while they are  traveling. . This test has been authorized by the FDA under an  Emergency Use Authorization (EUA) for use by authorized laboratories. . Please review the "Fact Sheets" and FDA authorized labeling available for health care providers and patients using the following websites: https://[email or web address]/[email or web address]/fact-sheet2  [email or web address]://[email or web address]/home/Covid-19/Patients/NAAT / fact-sheet2 . Due to the current public health emergency, [facility] is accepting samples from appropriate clinical sources collected using wide variety of swabs and transport media for COVID-19. Not detected test results derived from specimens received in non- commercially manufactured viral collection kits or those not yet authorized by FDA for COVID-19 testing should be cautiously evaluated and take extra precautions such as additional clinical monitoring, including collection of an additional specimen. . Additional information about COVID-19 can be found at the [employer] website: www.[email or web address]/[email or web address]. . For patients with a Detected or Inconclusive test result, please see CDC's COVID-19 Treatments and  Medications page located at  BroadcastLocal.cz- severe-illness.html for information on COVID-19 therapeutics. . For patients with a Not Detected test result, please see CDC's Vacci nes for COVID-19 page located at PhoneStatistics.is for information on COVID-19 vaccines.   Blood Culture (routine x 2)     Status: None (Preliminary result)   Collection Time: 04/16/21  2:17 PM   Specimen: Left Antecubital; Blood  Result Value Ref Range Status   Specimen Description LEFT ANTECUBITAL  Final   Special Requests   Final    BOTTLES DRAWN AEROBIC AND ANAEROBIC Blood Culture results may not be optimal due to an inadequate volume of blood received in culture bottles    Culture   Final    NO GROWTH 4 DAYS Performed at [facility], [place] [address], [place], [place] [address]    Report Status PENDING  Incomplete  Resp Panel by RT-PCR (Flu A&B, Covid) Nasopharyngeal Swab     Status: Abnormal   Collection Time: 04/16/21  2:54 PM   Specimen: Nasopharyngeal Swab; Nasopharyngeal(NP) swabs in vial transport medium  Result Value Ref Range Status   SARS Coronavirus 2 by RT PCR POSITIVE (A) NEGATIVE Final    Comment: (NOTE) SARS-CoV-2 target nucleic acids are DETECTED.  The SARS-CoV-2 RNA is generally detectable in upper respiratory specimens during the acute phase of infection. Positive results are indicative of the presence of the identified virus, but do not rule out bacterial infection or co-infection with other pathogens not detected by the test. Clinical correlation with patient history and other diagnostic information is necessary to determine patient infection status. The expected result is Negative.  Fact Sheet for Patients: EntrepreneurPulse.com.au  Fact Sheet for Healthcare Providers: IncredibleEmployment.be  This test is not yet approved or cleared by the [place] FDA and  has been authorized for detection and/or diagnosis of SARS-CoV-2 by FDA under an Emergency Use Authorization (EUA).  This EUA will remain in effect (meaning this test can be used) for the duration of  the COVID-19 declaration under Section 564(b)(1) of the A ct, 21 U.S.C. section 360bbb-3(b)(1), unless the authorization is terminated or revoked sooner.     Influenza A by PCR NEGATIVE NEGATIVE Final   Influenza B by PCR NEGATIVE NEGATIVE Final    Comment: (NOTE) The Xpert Xpress SARS-CoV-2/FLU/RSV plus assay is intended as an aid in the diagnosis of influenza from Nasopharyngeal swab specimens and should not be used as a sole basis for treatment. Nasal washings and aspirates are unacceptable for Xpert  Xpress  SARS-CoV-2/FLU/RSV testing.  Fact Sheet for Patients: EntrepreneurPulse.com.au  Fact Sheet for Healthcare Providers: IncredibleEmployment.be  This test is not yet approved or cleared by the [place] FDA and has been authorized for detection and/or diagnosis of SARS-CoV-2 by FDA under an Emergency Use Authorization (EUA). This EUA will remain in effect (meaning this test can be used) for the duration of the COVID-19 declaration under Section 564(b)(1) of the Act, 21 U.S.C. section 360bbb-3(b)(1), unless the authorization is terminated or revoked.  Performed at [facility], [place] [address] [place], [place] [address]        Radiology Studies: US RENAL  Result Date: 04/19/2021 CLINICAL DATA:  Acute on chronic renal failure EXAM: RENAL / URINARY TRACT ULTRASOUND COMPLETE COMPARISON:  CT 09/30/2010 FINDINGS: Right Kidney: Renal measurements: 8.1 x 4.2 x 4.3 cm = volume: 77 mL. Cortex slightly echogenic. No mass or hydronephrosis. Left Kidney: Renal measurements: 9 x 4.3 x 4.7 cm = volume: 95.5 mL. Cortex is slightly echogenic. No mass or hydronephrosis. Bladder: Appears normal for degree of bladder distention. Other: Small amount of abdominal ascites. IMPRESSION: 1. Slightly echogenic kidneys consistent with medical renal disease. No hydronephrosis. 2. Small amount of ascites. Electronically Signed   By: Donavan Foil M.D.   On: 04/19/2021 23:37   DG CHEST PORT 1 VIEW  Result Date: 04/18/2021 CLINICAL DATA:  Hypoxia, rapid response EXAM: PORTABLE CHEST 1 VIEW COMPARISON:  04/17/2021 FINDINGS: Single frontal view of the chest demonstrates stable enlargement of the cardiac silhouette. Wedge-shaped consolidation within the left mid lung zone again noted and unchanged. Overall increase in the interstitial and ground-glass opacities elsewhere throughout the lungs, greatest at the bases. No effusion or pneumothorax. No acute bony abnormalities.  IMPRESSION: 1. Persistent dense consolidation within the left midlung zone, which may reflect pneumonia, infarct, or neoplasm. Please see previous CT exam. 2. Worsening interstitial and ground-glass opacities throughout the lungs, compatible with progressive pneumonia or worsening edema. Electronically Signed   By: Randa Ngo M.D.   On: 04/18/2021 21:07   ECHOCARDIOGRAM LIMITED  Result Date: 04/19/2021    ECHOCARDIOGRAM LIMITED REPORT   Patient Name:   ARLOW SPIERS [facility] Date of Exam: 04/19/2021 Medical Rec #:  [id number]               Height:       67.0 in Accession #:    [id number]              Weight:       182.5 lb Date of Birth:  [birth year]                BSA:          1.945 m Patient Age:    57 years                BP:           121/61 mmHg Patient Gender: M                       HR:           86 bpm. Exam Location:  Inpatient Procedure: Limited Echo, 3D Echo, Cardiac Doppler and Color Doppler Indications:    I50.40* Unspecified combined systolic (congestive) and diastolic                 (congestive) heart failure  History:        Patient has prior history of Echocardiogram examinations, most  recent 08/01/2020. CHF, CAD, Abnormal ECG, Signs/Symptoms:Murmur                 and Chest Pain; Risk Factors:Diabetes and Hypertension. Covid                 positive.  Sonographer:    Roseanna Rainbow RDCS Referring Phys: [id number]  Sonographer Comments: Global longitudinal strain was attempted. IMPRESSIONS  1. Left ventricular ejection fraction, by estimation, is 45 to 50%. The left ventricle has mildly decreased function. The left ventricle demonstrates global hypokinesis.  2. Right ventricular systolic function is normal. The right ventricular size is normal. There is moderately elevated pulmonary artery systolic pressure.  3. Left atrial size was moderately dilated.  4. The mitral valve is abnormal. Moderate to severe mitral valve regurgitation. No evidence of mitral stenosis.  5.  Tricuspid valve regurgitation is moderate.  6. The aortic valve is tricuspid. There is moderate calcification of the aortic valve. There is moderate thickening of the aortic valve. Aortic valve regurgitation is mild. Aortic valve sclerosis/calcification is present, without any evidence of aortic stenosis.  7. The inferior vena cava is normal in size with greater than 50% respiratory variability, suggesting right atrial pressure of 3 mmHg. FINDINGS  Left Ventricle: Left ventricular ejection fraction, by estimation, is 45 to 50%. The left ventricle has mildly decreased function. The left ventricle demonstrates global hypokinesis. The left ventricular internal cavity size was normal in size. There is  no left ventricular hypertrophy. Right Ventricle: The right ventricular size is normal. No increase in right ventricular wall thickness. Right ventricular systolic function is normal. There is moderately elevated pulmonary artery systolic pressure. The tricuspid regurgitant velocity is 3.05 m/s, and with an assumed right atrial pressure of 15 mmHg, the estimated right ventricular systolic pressure is 76.5 mmHg. Left Atrium: Left atrial size was moderately dilated. Right Atrium: Right atrial size was normal in size. Pericardium: There is no evidence of pericardial effusion. Mitral Valve: The mitral valve is abnormal. There is moderate thickening of the mitral valve leaflet(s). There is moderate calcification of the mitral valve leaflet(s). Moderate to severe mitral valve regurgitation. No evidence of mitral valve stenosis. MV peak gradient, 11.6 mmHg. The mean mitral valve gradient is 5.0 mmHg. Tricuspid Valve: The tricuspid valve is normal in structure. Tricuspid valve regurgitation is moderate . No evidence of tricuspid stenosis. Aortic Valve: The aortic valve is tricuspid. There is moderate calcification of the aortic valve. There is moderate thickening of the aortic valve. Aortic valve regurgitation is mild. Aortic  regurgitation PHT measures 388 msec. Aortic valve sclerosis/calcification is present, without any evidence of aortic stenosis. Aortic valve mean gradient measures 4.5 mmHg. Aortic valve peak gradient measures 7.8 mmHg. Aortic valve area, by VTI measures 3.03 cm. Pulmonic Valve: The pulmonic valve was normal in structure. Pulmonic valve regurgitation is not visualized. No evidence of pulmonic stenosis. Aorta: The aortic root is normal in size and structure. Venous: The inferior vena cava is normal in size with greater than 50% respiratory variability, suggesting right atrial pressure of 3 mmHg. IAS/Shunts: No atrial level shunt detected by color flow Doppler. LEFT VENTRICLE PLAX 2D LVIDd:         5.00 cm LVIDs:         3.80 cm LV PW:         1.20 cm LV IVS:        1.00 cm LVOT diam:     2.10 cm      3D  Volume EF: LV SV:         76           3D EF:        45 % LV SV Index:   39           LV EDV:       139 ml LVOT Area:     3.46 cm     LV ESV:       76 ml                             LV SV:        63 ml  LV Volumes (MOD) LV vol d, MOD A2C: 113.0 ml LV vol d, MOD A4C: 111.0 ml LV vol s, MOD A2C: 67.2 ml LV vol s, MOD A4C: 60.7 ml LV SV MOD A2C:     45.8 ml LV SV MOD A4C:     111.0 ml LV SV MOD BP:      48.2 ml IVC IVC diam: 3.10 cm LEFT ATRIUM         Index LA diam:    4.80 cm 2.47 cm/m  AORTIC VALVE AV Area (Vmax):    3.23 cm AV Area (Vmean):   2.88 cm AV Area (VTI):     3.03 cm AV Vmax:           139.50 cm/s AV Vmean:          96.950 cm/s AV VTI:            0.249 m AV Peak Grad:      7.8 mmHg AV Mean Grad:      4.5 mmHg LVOT Vmax:         130.00 cm/s LVOT Vmean:        80.700 cm/s LVOT VTI:          0.218 m LVOT/AV VTI ratio: 0.88 AI PHT:            388 msec  AORTA Ao Root diam: 3.20 cm Ao Asc diam:  3.50 cm MITRAL VALVE                  TRICUSPID VALVE MV Area (PHT): 3.60 cm       TR Peak grad:   37.2 mmHg MV Area VTI:   1.73 cm       TR Vmax:        305.00 cm/s MV Peak grad:  11.6 mmHg MV Mean grad:  5.0 mmHg        SHUNTS MV Vmax:       1.70 m/s       Systemic VTI:  0.22 m MV Vmean:      102.0 cm/s     Systemic Diam: 2.10 cm MV Decel Time: 211 msec MR Peak grad:    98.0 mmHg MR Mean grad:    62.0 mmHg MR Vmax:         495.00 cm/s MR Vmean:        367.0 cm/s MR PISA:         3.08 cm MR PISA Eff ROA: 24 mm MR PISA Radius:  0.70 cm MV E velocity: 160.00 cm/s MV A velocity: 128.00 cm/s MV E/A ratio:  1.25 Jenkins Rouge MD Electronically signed by Jenkins Rouge MD Signature Date/Time: 04/19/2021/4:47:16 PM    Final        LOS: 4 days   Jonus Coble  [place]  [facility], [email or web address]  04/20/2021, 10:31 AM

## 2021-04-21 DIAGNOSIS — U071 COVID-19: Secondary | ICD-10-CM | POA: Diagnosis not present

## 2021-04-21 DIAGNOSIS — N179 Acute kidney failure, unspecified: Secondary | ICD-10-CM | POA: Diagnosis not present

## 2021-04-21 DIAGNOSIS — E1122 Type 2 diabetes mellitus with diabetic chronic kidney disease: Secondary | ICD-10-CM | POA: Diagnosis not present

## 2021-04-21 DIAGNOSIS — I5023 Acute on chronic systolic (congestive) heart failure: Secondary | ICD-10-CM | POA: Diagnosis not present

## 2021-04-21 LAB — RENAL FUNCTION PANEL
Albumin: 2.4 g/dL — ABNORMAL LOW (ref 3.5–5.0)
Anion gap: 12 (ref 5–15)
BUN: 68 mg/dL — ABNORMAL HIGH (ref 8–23)
CO2: 18 mmol/L — ABNORMAL LOW (ref 22–32)
Calcium: 7.5 mg/dL — ABNORMAL LOW (ref 8.9–10.3)
Chloride: 101 mmol/L (ref 98–111)
Creatinine, Ser: 2.56 mg/dL — ABNORMAL HIGH (ref 0.61–1.24)
GFR, Estimated: 23 mL/min — ABNORMAL LOW (ref >60)
Glucose, Bld: 151 mg/dL — ABNORMAL HIGH (ref 70–99)
Phosphorus: 4.1 mg/dL (ref 2.5–4.6)
Potassium: 4.9 mmol/L (ref 3.5–5.1)
Sodium: 131 mmol/L — ABNORMAL LOW (ref 135–145)

## 2021-04-21 LAB — CULTURE, BLOOD (ROUTINE X 2): Culture: NO GROWTH

## 2021-04-21 LAB — GLUCOSE, CAPILLARY
Glucose-Capillary: 116 mg/dL — ABNORMAL HIGH (ref 70–99)
Glucose-Capillary: 115 mg/dL — ABNORMAL HIGH (ref 70–99)
Glucose-Capillary: 140 mg/dL — ABNORMAL HIGH (ref 70–99)
Glucose-Capillary: 139 mg/dL — ABNORMAL HIGH (ref 70–99)

## 2021-04-21 NOTE — Progress Notes (Signed)
[facility] PROGRESS NOTE   Kevin Schwartz SEG:[id number] DOB: December 20, 1930 DOA: 04/16/2021  PCP: [name], MD  Brief History/Interval Summary: 88 y.o. male with medical history significant of CAD with stenting on Plavix and Eliquis regimen, chronic systolic CHF LVEF 73-71%, CKD stage IIIa, HTN, HLD, BPH, IIDM, presented with increasing shortness of breath and cough and leg swelling.  Patient tested positive for COVID-19.  Imaging studies showed left upper lobe consolidation and bilateral groundglass opacities.  Patient was hospitalized for further management.  Reason for Visit: Pneumonia due to COVID-19.  Acute systolic CHF.  Acute kidney injury  Consultants: Nephrology  Procedures: None    Subjective/Interval History: Patient mentions that he is feeling much better today.  Denies any shortness of breath.  Slept well overnight.  Not noted to be on oxygen this morning.  Reports good appetite.     Assessment/Plan:  Pneumonia due to COVID-19/sepsis, present on admission Patient had sepsis criteria on admission.  Noted to be tachypneic, tachycardic with elevated WBC.  He also had elevated lactic acid level. It is felt that his presentation was primarily due to COVID-19 with some concern for secondary bacterial infection.  Procalcitonin was 0.8. Patient has completed 5 days of Remdesivir.  We will also complete 5 days of ceftriaxone.   Had mild hemoptysis which could be from pneumonia.  Patient does not have any symptoms or signs otherwise suggestive of thromboembolic process.  He is already on anticoagulation for a flutter. However his D-dimer was noted to be elevated which could from COVID-19.  A lower extremity Doppler study was negative for DVT.   CRP was elevated at 12.7 and then improved to 5.7.   Continue with dexamethasone to complete a 10-day course. Respiratory status is stable.  Actually has improved significantly in the last 48 hours.  Now saturating  normal on room air.  Acute respiratory failure with hypoxia Initially due to pneumonia from COVID-19.  He was weaned off of oxygen but required oxygen again 2 days ago likely from fluid overload.  Respiratory status is stable.   He has been weaned off of oxygen.  Noted to be improving.  Acute on chronic kidney disease stage IIIa/hyponatremia/hyperkalemia Baseline creatinine seems to be around 1.3-1.6.   Renal function worsened and creatinine went up to 2.70 .  He also has hyponatremia and hyperkalemia.  He was given Lokelma. Sodium level is stable. Renal function gradually improving.  Creatinine down to 2.56 His urine output appears to have picked up.  Holding Lasix.  Nephrology is following. Bicarbonate level has improved.  He is on oral sodium bicarbonate. Potassium level is stable. Renal ultrasound does not show any hydronephrosis.  Acute on chronic systolic CHF/moderate to severe mitral regurgitation EF is noted to be 45 to 50% on echocardiogram done in April 2022.  He was noted to have lower extremity edema.  He was given a low-dose of furosemide.  Had drop in his blood pressure. Echocardiogram was repeated since patient remains dyspneic with concern for fluid overload. Continues to show EF of 45 to 50% with global hypokinesis of the left ventricle.  Right ventricular function was normal.  Moderately elevated pulmonary artery systolic pressures were noted.  Moderate to severe MR was noted.  MR was mild on echocardiogram of April 2022.  Will need to be seen by cardiology but could be done in the outpatient setting.  He is followed by [facility] cardiology. Blood pressure has stabilized.  Cortisol level was 8.9. Cardiac medications  on hold due to low blood pressures.  Prior to admission he was on amlodipine, furosemide.   Not on beta-blocker due to heart block.  Not on ACE inhibitor or ARB or Entresto due to history of angioedema on ACE inhibitor and ARB.  Acute metabolic encephalopathy Likely  due to COVID-19.  Seems to have resolved.  Back to baseline now.  History of coronary artery disease Stable.  Continue Plavix.  Diabetes mellitus type 2, with renal complications, with chronic kidney disease stage IIIa He was noted to have hypoglycemia initially.  Wilder Glade was held.  CBGs have stabilized.  HbA1c 6.2.    History of a flutter, unspecified Followed by cardiology.  On Eliquis for stroke prevention.   DVT Prophylaxis: On Eliquis Code Status: Full code Family Communication: No family at bedside.  Discussed with patient Disposition Plan: SNF recommended by physical therapy  Status is: Inpatient  Remains inpatient appropriate because: Acute on chronic systolic CHF, pneumonia due to COVID-19    Medications: Scheduled:  allopurinol  100 mg Oral Daily   apixaban  2.5 mg Oral BID   atorvastatin  80 mg Oral Daily   citalopram  20 mg Oral Daily   clopidogrel  75 mg Oral Q breakfast   dexamethasone (DECADRON) injection  6 mg Intravenous Q24H   ezetimibe  10 mg Oral Daily   insulin aspart  0-9 Units Subcutaneous TID WC   Ipratropium-Albuterol  1 puff Inhalation BID   sodium bicarbonate  650 mg Oral BID   sodium chloride flush  3 mL Intravenous Q12H   Continuous:  sodium chloride     cefTRIAXone (ROCEPHIN)  IV 1 g (04/20/21 0927)   BSJ:GGEZMO chloride, acetaminophen, guaiFENesin-dextromethorphan, ondansetron (ZOFRAN) IV, sodium chloride flush  Antibiotics: Anti-infectives (From admission, onward)    Start     Dose/Rate Route Frequency Ordered Stop   04/17/21 1000  remdesivir 100 mg in sodium chloride 0.9 % 100 mL IVPB       See Hyperspace for full Linked Orders Report.   100 mg 200 mL/hr over 30 Minutes Intravenous Daily 04/16/21 1640 04/20/21 1118   04/17/21 1000  cefTRIAXone (ROCEPHIN) 1 g in sodium chloride 0.9 % 100 mL IVPB        1 g 200 mL/hr over 30 Minutes Intravenous Every 24 hours 04/16/21 1734 04/22/21 0959   04/16/21 2000  remdesivir 200 mg in sodium  chloride 0.9% 250 mL IVPB       See Hyperspace for full Linked Orders Report.   200 mg 580 mL/hr over 30 Minutes Intravenous Once 04/16/21 1640 04/16/21 2039   04/16/21 1500  doxycycline (VIBRAMYCIN) 100 mg in sodium chloride 0.9 % 250 mL IVPB        100 mg 125 mL/hr over 120 Minutes Intravenous  Once 04/16/21 1448 04/16/21 1912   04/16/21 1445  cefTRIAXone (ROCEPHIN) 1 g in sodium chloride 0.9 % 100 mL IVPB        1 g 200 mL/hr over 30 Minutes Intravenous  Once 04/16/21 1441 04/16/21 1659   04/16/21 1445  azithromycin (ZITHROMAX) 500 mg in sodium chloride 0.9 % 250 mL IVPB  Status:  Discontinued        500 mg 250 mL/hr over 60 Minutes Intravenous  Once 04/16/21 1441 04/16/21 1448       Objective:  Vital Signs  Vitals:   04/21/21 0457 04/21/21 0500 04/21/21 0745 04/21/21 0800  BP:   124/72 120/67  Pulse:  85 84 84  Resp:   16  Temp:   97.6 F (36.4 C)   TempSrc:   Oral   SpO2:  95% 92% 96%  Weight: 84.5 kg     Height:        Intake/Output Summary (Last 24 hours) at 04/21/2021 1039 Last data filed at 04/20/2021 1400 Gross per 24 hour  Intake 400 ml  Output --  Net 400 ml    Filed Weights   04/18/21 0400 04/19/21 0437 04/21/21 0457  Weight: 83.2 kg 82.8 kg 84.5 kg    General appearance: Awake alert.  In no distress Resp: Much improved air entry bilaterally.  No crackles wheezing or rhonchi appreciated. Cardio: S1-S2 is normal regular.  No S3-S4.  No rubs murmurs or bruit GI: Abdomen is soft.  Nontender nondistended.  Bowel sounds are present normal.  No masses organomegaly Extremities: Improvement in lower extremity edema noted.  He is noted to be physically deconditioned Neurologic: Alert and oriented x3.  No focal neurological deficits.     Lab Results:  Data Reviewed: I have personally reviewed following labs and imaging studies  CBC: Recent Labs  Lab 04/16/21 1450 04/16/21 2005 04/19/21 0214  WBC 11.5*  --  18.3*  NEUTROABS 10.5*  --   --   HGB 15.8  15.0 12.4*  HCT 46.4 44.0 36.6*  MCV 90.1  --  88.0  PLT 225  --  269     Basic Metabolic Panel: Recent Labs  Lab 04/16/21 1450 04/16/21 2005 04/17/21 0459 04/18/21 0121 04/19/21 0214 04/19/21 1414 04/20/21 0045 04/21/21 0107  NA 134*   < > 134* 130* 129*  --  130* 131*  K 4.5   < > 4.6 5.0 5.3*  --  4.6 4.9  CL 104  --  105 105 102  --  97* 101  CO2 18*  --  18* 18* 15*  --  17* 18*  GLUCOSE 72  --  105* 205* 107*  --  123* 151*  BUN 34*  --  37* 47* 59*  --  66* 68*  CREATININE 1.66*  --  1.65* 1.94* 2.36*  --  2.70* 2.56*  CALCIUM 8.6*  --  7.7* 7.6* 7.6*  --  7.8* 7.5*  MG 2.0  --  1.7  --   --   --   --   --   PHOS  --   --  3.5  --   --  4.5  --  4.1   < > = values in this interval not displayed.     GFR: Estimated Creatinine Clearance: 20.7 mL/min (A) (by C-G formula based on SCr of 2.56 mg/dL (H)).  Liver Function Tests: Recent Labs  Lab 04/16/21 1450 04/21/21 0107  AST 62*  --   ALT 30  --   ALKPHOS 83  --   BILITOT 2.4*  --   PROT 7.8  --   ALBUMIN 3.2* 2.4*      Coagulation Profile: Recent Labs  Lab 04/16/21 1450  INR 1.1    CBG: Recent Labs  Lab 04/20/21 0757 04/20/21 1152 04/20/21 1703 04/20/21 2108 04/21/21 0747  GLUCAP 110* 127* 113* 170* 116*       Recent Results (from the past 240 hour(s))  SARS-COV-2 RNA,(COVID-19) QUAL NAAT     Status: Abnormal   Collection Time: 04/11/21 11:17 AM  Result Value Ref Range Status   SARS CoV2 RNA DETECTED (A) NOT DETECTED Final    Comment: . A Detected result indicates that the patient's specimen was  positive for SARS-CoV-2 RNA. [name] Test Method: Nucleic Acid Amplification Test including reverse transcription polymerase chain reaction (RT-PCR) and transcription mediated amplification (TMA). The test method meets the [place] Centers for Disease Control and prevention (CDC) pre departure and arrival requirement for viral test for COVID-19 dated May 12, 2019. Testing requirements for  traveling may change with time. The patient is responsible for determining the test requirements for each nation while they are traveling. . This test has been authorized by the FDA under an  Emergency Use Authorization (EUA) for use by authorized laboratories. . Please review the "Fact Sheets" and FDA authorized labeling available for health care providers and patients using the following websites: [email or web address]://[email or web address]/fact-[email or web address]  [email or web address]/home/[email or web address]-19/Patients/NAAT / fact-sheet2 . Due to the current public health emergency, [facility] is accepting samples from appropriate clinical sources collected using wide variety of swabs and transport media for COVID-19. Not detected test results derived from specimens received in non- commercially manufactured viral collection kits or those not yet authorized by FDA for COVID-19 testing should be cautiously evaluated and take extra precautions such as additional clinical monitoring, including collection of an additional specimen. . Additional information about COVID-19 can be found at the [employer] website: www.[email or web address]. . For patients with a Detected or Inconclusive test result, please see CDC's COVID-19 Treatments and  Medications page located at  BroadcastLocal.cz- severe-illness.html for information on COVID-19 therapeutics. . For patients with a Not Detected test result, please see CDC's Vacci nes for COVID-19 page located at PhoneStatistics.is for information on COVID-19 vaccines.   Blood Culture (routine x 2)     Status: None   Collection Time: 04/16/21  2:17 PM   Specimen: Left Antecubital; Blood  Result Value Ref Range Status   Specimen Description LEFT ANTECUBITAL  Final   Special Requests   Final    BOTTLES DRAWN AEROBIC AND  ANAEROBIC Blood Culture results may not be optimal due to an inadequate volume of blood received in culture bottles   Culture   Final    NO GROWTH 5 DAYS Performed at [facility], [place] [address], [place], [place] [address]    Report Status 04/21/2021 FINAL  Final  Resp Panel by RT-PCR (Flu A&B, Covid) Nasopharyngeal Swab     Status: Abnormal   Collection Time: 04/16/21  2:54 PM   Specimen: Nasopharyngeal Swab; Nasopharyngeal(NP) swabs in vial transport medium  Result Value Ref Range Status   SARS Coronavirus 2 by RT PCR POSITIVE (A) NEGATIVE Final    Comment: (NOTE) SARS-CoV-2 target nucleic acids are DETECTED.  The SARS-CoV-2 RNA is generally detectable in upper respiratory specimens during the acute phase of infection. Positive results are indicative of the presence of the identified virus, but do not rule out bacterial infection or co-infection with other pathogens not detected by the test. Clinical correlation with patient history and other diagnostic information is necessary to determine patient infection status. The expected result is Negative.  Fact Sheet for Patients: EntrepreneurPulse.com.au  Fact Sheet for Healthcare Providers: IncredibleEmployment.be  This test is not yet approved or cleared by the [place] FDA and  has been authorized for detection and/or diagnosis of SARS-CoV-2 by FDA under an Emergency Use Authorization (EUA).  This EUA will remain in effect (meaning this test can be used) for the duration of  the COVID-19 declaration under Section 564(b)(1) of the A ct, 21 U.S.C. section 360bbb-3(b)(1), unless the authorization is terminated or revoked sooner.     Influenza A by PCR NEGATIVE NEGATIVE Final  Influenza B by PCR NEGATIVE NEGATIVE Final    Comment: (NOTE) The Xpert Xpress SARS-CoV-2/FLU/RSV plus assay is intended as an aid in the diagnosis of influenza from Nasopharyngeal swab specimens and should  not be used as a sole basis for treatment. Nasal washings and aspirates are unacceptable for Xpert Xpress SARS-CoV-2/FLU/RSV testing.  Fact Sheet for Patients: EntrepreneurPulse.com.au  Fact Sheet for Healthcare Providers: IncredibleEmployment.be  This test is not yet approved or cleared by the [place] FDA and has been authorized for detection and/or diagnosis of SARS-CoV-2 by FDA under an Emergency Use Authorization (EUA). This EUA will remain in effect (meaning this test can be used) for the duration of the COVID-19 declaration under Section 564(b)(1) of the Act, 21 U.S.C. section 360bbb-3(b)(1), unless the authorization is terminated or revoked.  Performed at [facility], [place] [address] [place], [place] [address]        Radiology Studies: US RENAL  Result Date: 04/19/2021 CLINICAL DATA:  Acute on chronic renal failure EXAM: RENAL / URINARY TRACT ULTRASOUND COMPLETE COMPARISON:  CT 09/30/2010 FINDINGS: Right Kidney: Renal measurements: 8.1 x 4.2 x 4.3 cm = volume: 77 mL. Cortex slightly echogenic. No mass or hydronephrosis. Left Kidney: Renal measurements: 9 x 4.3 x 4.7 cm = volume: 95.5 mL. Cortex is slightly echogenic. No mass or hydronephrosis. Bladder: Appears normal for degree of bladder distention. Other: Small amount of abdominal ascites. IMPRESSION: 1. Slightly echogenic kidneys consistent with medical renal disease. No hydronephrosis. 2. Small amount of ascites. Electronically Signed   By: Donavan Foil M.D.   On: 04/19/2021 23:37   ECHOCARDIOGRAM LIMITED  Result Date: 04/19/2021    ECHOCARDIOGRAM LIMITED REPORT   Patient Name:   Kevin Schwartz [facility] Date of Exam: 04/19/2021 Medical Rec #:  [id number]               Height:       67.0 in Accession #:    [id number]              Weight:       182.5 lb Date of Birth:  [birth year]                BSA:          1.945 m Patient Age:    58 years                BP:           121/61 mmHg  Patient Gender: M                       HR:           86 bpm. Exam Location:  Inpatient Procedure: Limited Echo, 3D Echo, Cardiac Doppler and Color Doppler Indications:    I50.40* Unspecified combined systolic (congestive) and diastolic                 (congestive) heart failure  History:        Patient has prior history of Echocardiogram examinations, most                 recent 08/01/2020. CHF, CAD, Abnormal ECG, Signs/Symptoms:Murmur                 and Chest Pain; Risk Factors:Diabetes and Hypertension. Covid                 positive.  Sonographer:    Roseanna Rainbow RDCS Referring Phys: [phone] [facility]  Sonographer Comments: Global longitudinal strain was attempted. IMPRESSIONS  1. Left ventricular ejection fraction, by estimation, is 45 to 50%. The left ventricle has mildly decreased function. The left ventricle demonstrates global hypokinesis.  2. Right ventricular systolic function is normal. The right ventricular size is normal. There is moderately elevated pulmonary artery systolic pressure.  3. Left atrial size was moderately dilated.  4. The mitral valve is abnormal. Moderate to severe mitral valve regurgitation. No evidence of mitral stenosis.  5. Tricuspid valve regurgitation is moderate.  6. The aortic valve is tricuspid. There is moderate calcification of the aortic valve. There is moderate thickening of the aortic valve. Aortic valve regurgitation is mild. Aortic valve sclerosis/calcification is present, without any evidence of aortic stenosis.  7. The inferior vena cava is normal in size with greater than 50% respiratory variability, suggesting right atrial pressure of 3 mmHg. FINDINGS  Left Ventricle: Left ventricular ejection fraction, by estimation, is 45 to 50%. The left ventricle has mildly decreased function. The left ventricle demonstrates global hypokinesis. The left ventricular internal cavity size was normal in size. There is  no left ventricular hypertrophy. Right Ventricle: The right  ventricular size is normal. No increase in right ventricular wall thickness. Right ventricular systolic function is normal. There is moderately elevated pulmonary artery systolic pressure. The tricuspid regurgitant velocity is 3.05 m/s, and with an assumed right atrial pressure of 15 mmHg, the estimated right ventricular systolic pressure is 42.5 mmHg. Left Atrium: Left atrial size was moderately dilated. Right Atrium: Right atrial size was normal in size. Pericardium: There is no evidence of pericardial effusion. Mitral Valve: The mitral valve is abnormal. There is moderate thickening of the mitral valve leaflet(s). There is moderate calcification of the mitral valve leaflet(s). Moderate to severe mitral valve regurgitation. No evidence of mitral valve stenosis. MV peak gradient, 11.6 mmHg. The mean mitral valve gradient is 5.0 mmHg. Tricuspid Valve: The tricuspid valve is normal in structure. Tricuspid valve regurgitation is moderate . No evidence of tricuspid stenosis. Aortic Valve: The aortic valve is tricuspid. There is moderate calcification of the aortic valve. There is moderate thickening of the aortic valve. Aortic valve regurgitation is mild. Aortic regurgitation PHT measures 388 msec. Aortic valve sclerosis/calcification is present, without any evidence of aortic stenosis. Aortic valve mean gradient measures 4.5 mmHg. Aortic valve peak gradient measures 7.8 mmHg. Aortic valve area, by VTI measures 3.03 cm. Pulmonic Valve: The pulmonic valve was normal in structure. Pulmonic valve regurgitation is not visualized. No evidence of pulmonic stenosis. Aorta: The aortic root is normal in size and structure. Venous: The inferior vena cava is normal in size with greater than 50% respiratory variability, suggesting right atrial pressure of 3 mmHg. IAS/Shunts: No atrial level shunt detected by color flow Doppler. LEFT VENTRICLE PLAX 2D LVIDd:         5.00 cm LVIDs:         3.80 cm LV PW:         1.20 cm LV IVS:         1.00 cm LVOT diam:     2.10 cm      3D Volume EF: LV SV:         76           3D EF:        45 % LV SV Index:   39           LV EDV:       139 ml LVOT Area:  3.46 cm     LV ESV:       76 ml                             LV SV:        63 ml  LV Volumes (MOD) LV vol d, MOD A2C: 113.0 ml LV vol d, MOD A4C: 111.0 ml LV vol s, MOD A2C: 67.2 ml LV vol s, MOD A4C: 60.7 ml LV SV MOD A2C:     45.8 ml LV SV MOD A4C:     111.0 ml LV SV MOD BP:      48.2 ml IVC IVC diam: 3.10 cm LEFT ATRIUM         Index LA diam:    4.80 cm 2.47 cm/m  AORTIC VALVE AV Area (Vmax):    3.23 cm AV Area (Vmean):   2.88 cm AV Area (VTI):     3.03 cm AV Vmax:           139.50 cm/s AV Vmean:          96.950 cm/s AV VTI:            0.249 m AV Peak Grad:      7.8 mmHg AV Mean Grad:      4.5 mmHg LVOT Vmax:         130.00 cm/s LVOT Vmean:        80.700 cm/s LVOT VTI:          0.218 m LVOT/AV VTI ratio: 0.88 AI PHT:            388 msec  AORTA Ao Root diam: 3.20 cm Ao Asc diam:  3.50 cm MITRAL VALVE                  TRICUSPID VALVE MV Area (PHT): 3.60 cm       TR Peak grad:   37.2 mmHg MV Area VTI:   1.73 cm       TR Vmax:        305.00 cm/s MV Peak grad:  11.6 mmHg MV Mean grad:  5.0 mmHg       SHUNTS MV Vmax:       1.70 m/s       Systemic VTI:  0.22 m MV Vmean:      102.0 cm/s     Systemic Diam: 2.10 cm MV Decel Time: 211 msec MR Peak grad:    98.0 mmHg MR Mean grad:    62.0 mmHg MR Vmax:         495.00 cm/s MR Vmean:        367.0 cm/s MR PISA:         3.08 cm MR PISA Eff ROA: 24 mm MR PISA Radius:  0.70 cm MV E velocity: 160.00 cm/s MV A velocity: 128.00 cm/s MV E/A ratio:  1.25 Jenkins Rouge MD Electronically signed by Jenkins Rouge MD Signature Date/Time: 04/19/2021/4:47:16 PM    Final        LOS: 5 days   Bonnielee Haff  [facility] Pager on [email or web address]  04/21/2021, 10:39 AM

## 2021-04-21 NOTE — Progress Notes (Signed)
Rt asked pt about the running CPAP that he was not wearing. Pt replied with "put that thing up." Pt refused CPAP, no distress and vitals are stable. RT will continue to monitor as needed.

## 2021-04-21 NOTE — Progress Notes (Signed)
[facility] Progress Note   88 y.o. male HTN, HLD, BPH, DM, CASHD s/p PCI on Plavix and Eliquis, MGUS, OSA, CHF with EF 45-50%, CKD 3a followed by [facility] presenting with a cough which eventually became productive of yellowish sputum associated with shortness of breath. He denies chest pain, fever, chills. Patient has had worsening leg swelling as well. He tested positive for COVID Saturday but unfortunately patient because confused leading to transport to the ED. CXR showed LUL consolidation and changes compatible with a viral pneumonia. Patient was started on ciftriaxone and remdesivir + steroids. Patient was also on Farxiga at home. Patient received lasix 20mg  on 1/4 and then 60mg  on 1/6. Patient is positive 1.4L during this hospitalization. Urinalysis showed >300 mg/dl which has been present since 07/2020, microscopy was not active. BUN/Cr was 37/1.65 at admission and has since steadily increased.  BL Cr appears to be 1.3-1.5.   Assessment/ Plan:   Acute kidney injury on CKD3a followed by [facility] (Dr. Jimmy Footman) at one point with kidney disease thought to be secondary to diabetes and hypertensive nephrosclerosis. He's had 3+ proteinuria before this current hospitalization.  - Urine microscopy not c/w ATN.; only 1-2 WBC/HPF and no RBC's with no pigmented casts and would think more likely to be hemodynamically mediated but FeNA suggestive of ATN.  - Renal ultrasound shows e/o kidney disease (pt w/ h/o CKD) but no e/o obstruction. - Started on  HCO3 at 1 tab BID; renal diet with fluid restriction. - He was positive and appeared to be in failure leading to another dose of Lasix; but poor response.  - Agree with holding Wilder Glade given the renal dysfunction. - Fortunately no absolute indication for RRT and  renal function may be stabilizing.  - Would hold Lasix again for today as renal function is improving + dyspnea improving as well clinically as well as sats 94% on RA; will evaluate tomorrow to  determine if he needs Lasix with ATN. ATN likely from COVID PNA.   -Monitor Daily I/Os, Daily weight  -Maintain MAP>65 for optimal renal perfusion.  -Avoid nephrotoxic medications including NSAIDs - Dose medications for GFR <30 for now.   COVID PNA on steroids and Remdesivir with improving CRP trends weaned off supplemental O2. HFrEF 45-50% with lower extremity edema which he states has been present for a few weeks now; he claims this was not present a few months ago. Hyperkalemia secondary to renal dysfunction - mild grade given Lokelma CASHD appears to be stable. DM  Subjective:   Dyspneic improved c/w yesterday. More alert today.   Objective:   BP 124/76 (BP Location: Right Arm)    Pulse 85    Temp 97.6 F (36.4 C) (Oral)    Resp 16    Ht 5\' 7"  (1.702 m)    Wt 84.5 kg    SpO2 95%    BMI 29.18 kg/m   Intake/Output Summary (Last 24 hours) at 04/21/2021 0711 Last data filed at 04/20/2021 1400 Gross per 24 hour  Intake 400 ml  Output --  Net 400 ml   Weight change:   Physical Exam: General appearance: no distress and more alert today Head: NCAT Back: symmetric, no curvature. ROM normal. No CVA tenderness. Resp: poor air movement Cardio: regular rate and rhythm GI: soft, non-tender; bowel sounds normal; no masses,  no organomegaly Extremities: edema tr Pulses: 2+ and symmetric  Imaging: US RENAL  Result Date: 04/19/2021 CLINICAL DATA:  Acute on chronic renal failure EXAM: RENAL / URINARY TRACT  ULTRASOUND COMPLETE COMPARISON:  CT 09/30/2010 FINDINGS: Right Kidney: Renal measurements: 8.1 x 4.2 x 4.3 cm = volume: 77 mL. Cortex slightly echogenic. No mass or hydronephrosis. Left Kidney: Renal measurements: 9 x 4.3 x 4.7 cm = volume: 95.5 mL. Cortex is slightly echogenic. No mass or hydronephrosis. Bladder: Appears normal for degree of bladder distention. Other: Small amount of abdominal ascites. IMPRESSION: 1. Slightly echogenic kidneys consistent with medical renal disease. No  hydronephrosis. 2. Small amount of ascites. Electronically Signed   By: Donavan Foil M.D.   On: 04/19/2021 23:37   ECHOCARDIOGRAM LIMITED  Result Date: 04/19/2021    ECHOCARDIOGRAM LIMITED REPORT   Patient Name:   Kevin Schwartz [facility] Date of Exam: 04/19/2021 Medical Rec #:  [id number]               Height:       67.0 in Accession #:    [id number]              Weight:       182.5 lb Date of Birth:  07-21-1930                BSA:          1.945 m Patient Age:    41 years                BP:           121/61 mmHg Patient Gender: M                       HR:           86 bpm. Exam Location:  Inpatient Procedure: Limited Echo, 3D Echo, Cardiac Doppler and Color Doppler Indications:    I50.40* Unspecified combined systolic (congestive) and diastolic                 (congestive) heart failure  History:        Patient has prior history of Echocardiogram examinations, most                 recent 08/01/2020. CHF, CAD, Abnormal ECG, Signs/Symptoms:Murmur                 and Chest Pain; Risk Factors:Diabetes and Hypertension. Covid                 positive.  Sonographer:    Roseanna Rainbow RDCS Referring Phys: [id number] [facility]  Sonographer Comments: Global longitudinal strain was attempted. IMPRESSIONS  1. Left ventricular ejection fraction, by estimation, is 45 to 50%. The left ventricle has mildly decreased function. The left ventricle demonstrates global hypokinesis.  2. Right ventricular systolic function is normal. The right ventricular size is normal. There is moderately elevated pulmonary artery systolic pressure.  3. Left atrial size was moderately dilated.  4. The mitral valve is abnormal. Moderate to severe mitral valve regurgitation. No evidence of mitral stenosis.  5. Tricuspid valve regurgitation is moderate.  6. The aortic valve is tricuspid. There is moderate calcification of the aortic valve. There is moderate thickening of the aortic valve. Aortic valve regurgitation is mild. Aortic valve sclerosis/calcification  is present, without any evidence of aortic stenosis.  7. The inferior vena cava is normal in size with greater than 50% respiratory variability, suggesting right atrial pressure of 3 mmHg. FINDINGS  Left Ventricle: Left ventricular ejection fraction, by estimation, is 45 to 50%. The left ventricle has mildly decreased function. The left ventricle demonstrates global  hypokinesis. The left ventricular internal cavity size was normal in size. There is  no left ventricular hypertrophy. Right Ventricle: The right ventricular size is normal. No increase in right ventricular wall thickness. Right ventricular systolic function is normal. There is moderately elevated pulmonary artery systolic pressure. The tricuspid regurgitant velocity is 3.05 m/s, and with an assumed right atrial pressure of 15 mmHg, the estimated right ventricular systolic pressure is 79.8 mmHg. Left Atrium: Left atrial size was moderately dilated. Right Atrium: Right atrial size was normal in size. Pericardium: There is no evidence of pericardial effusion. Mitral Valve: The mitral valve is abnormal. There is moderate thickening of the mitral valve leaflet(s). There is moderate calcification of the mitral valve leaflet(s). Moderate to severe mitral valve regurgitation. No evidence of mitral valve stenosis. MV peak gradient, 11.6 mmHg. The mean mitral valve gradient is 5.0 mmHg. Tricuspid Valve: The tricuspid valve is normal in structure. Tricuspid valve regurgitation is moderate . No evidence of tricuspid stenosis. Aortic Valve: The aortic valve is tricuspid. There is moderate calcification of the aortic valve. There is moderate thickening of the aortic valve. Aortic valve regurgitation is mild. Aortic regurgitation PHT measures 388 msec. Aortic valve sclerosis/calcification is present, without any evidence of aortic stenosis. Aortic valve mean gradient measures 4.5 mmHg. Aortic valve peak gradient measures 7.8 mmHg. Aortic valve area, by VTI measures  3.03 cm. Pulmonic Valve: The pulmonic valve was normal in structure. Pulmonic valve regurgitation is not visualized. No evidence of pulmonic stenosis. Aorta: The aortic root is normal in size and structure. Venous: The inferior vena cava is normal in size with greater than 50% respiratory variability, suggesting right atrial pressure of 3 mmHg. IAS/Shunts: No atrial level shunt detected by color flow Doppler. LEFT VENTRICLE PLAX 2D LVIDd:         5.00 cm LVIDs:         3.80 cm LV PW:         1.20 cm LV IVS:        1.00 cm LVOT diam:     2.10 cm      3D Volume EF: LV SV:         76           3D EF:        45 % LV SV Index:   39           LV EDV:       139 ml LVOT Area:     3.46 cm     LV ESV:       76 ml                             LV SV:        63 ml  LV Volumes (MOD) LV vol d, MOD A2C: 113.0 ml LV vol d, MOD A4C: 111.0 ml LV vol s, MOD A2C: 67.2 ml LV vol s, MOD A4C: 60.7 ml LV SV MOD A2C:     45.8 ml LV SV MOD A4C:     111.0 ml LV SV MOD BP:      48.2 ml IVC IVC diam: 3.10 cm LEFT ATRIUM         Index LA diam:    4.80 cm 2.47 cm/m  AORTIC VALVE AV Area (Vmax):    3.23 cm AV Area (Vmean):   2.88 cm AV Area (VTI):     3.03 cm AV Vmax:  139.50 cm/s AV Vmean:          96.950 cm/s AV VTI:            0.249 m AV Peak Grad:      7.8 mmHg AV Mean Grad:      4.5 mmHg LVOT Vmax:         130.00 cm/s LVOT Vmean:        80.700 cm/s LVOT VTI:          0.218 m LVOT/AV VTI ratio: 0.88 AI PHT:            388 msec  AORTA Ao Root diam: 3.20 cm Ao Asc diam:  3.50 cm MITRAL VALVE                  TRICUSPID VALVE MV Area (PHT): 3.60 cm       TR Peak grad:   37.2 mmHg MV Area VTI:   1.73 cm       TR Vmax:        305.00 cm/s MV Peak grad:  11.6 mmHg MV Mean grad:  5.0 mmHg       SHUNTS MV Vmax:       1.70 m/s       Systemic VTI:  0.22 m MV Vmean:      102.0 cm/s     Systemic Diam: 2.10 cm MV Decel Time: 211 msec MR Peak grad:    98.0 mmHg MR Mean grad:    62.0 mmHg MR Vmax:         495.00 cm/s MR Vmean:        367.0 cm/s  MR PISA:         3.08 cm MR PISA Eff ROA: 24 mm MR PISA Radius:  0.70 cm MV E velocity: 160.00 cm/s MV A velocity: 128.00 cm/s MV E/A ratio:  1.25 Jenkins Rouge MD Electronically signed by Jenkins Rouge MD Signature Date/Time: 04/19/2021/4:47:16 PM    Final     Labs: BMET Recent Labs  Lab 04/16/21 1450 04/16/21 2005 04/17/21 0459 04/18/21 0121 04/19/21 0214 04/19/21 1414 04/20/21 0045 04/21/21 0107  NA 134* 136 134* 130* 129*  --  130* 131*  K 4.5 5.1 4.6 5.0 5.3*  --  4.6 4.9  CL 104  --  105 105 102  --  97* 101  CO2 18*  --  18* 18* 15*  --  17* 18*  GLUCOSE 72  --  105* 205* 107*  --  123* 151*  BUN 34*  --  37* 47* 59*  --  66* 68*  CREATININE 1.66*  --  1.65* 1.94* 2.36*  --  2.70* 2.56*  CALCIUM 8.6*  --  7.7* 7.6* 7.6*  --  7.8* 7.5*  PHOS  --   --  3.5  --   --  4.5  --  4.1   CBC Recent Labs  Lab 04/16/21 1450 04/16/21 2005 04/19/21 0214  WBC 11.5*  --  18.3*  NEUTROABS 10.5*  --   --   HGB 15.8 15.0 12.4*  HCT 46.4 44.0 36.6*  MCV 90.1  --  88.0  PLT 225  --  269    Medications:     allopurinol  100 mg Oral Daily   apixaban  2.5 mg Oral BID   atorvastatin  80 mg Oral Daily   citalopram  20 mg Oral Daily   clopidogrel  75 mg Oral Q breakfast   dexamethasone (DECADRON) injection  6  mg Intravenous Q24H   ezetimibe  10 mg Oral Daily   insulin aspart  0-9 Units Subcutaneous TID WC   Ipratropium-Albuterol  1 puff Inhalation BID   sodium bicarbonate  650 mg Oral BID   sodium chloride flush  3 mL Intravenous Q12H      Otelia Santee, MD 04/21/2021, 7:11 AM

## 2021-04-21 NOTE — Plan of Care (Signed)
Pt rested during overnight. Vitals stable. Pt had tele events with pvc during overnight. Provider aware. Creatinine improved at 2.56. Pt on room air sating 100% Problem: Education: Goal: Knowledge of General Education information will improve Description: Including pain rating scale, medication(s)/side effects and non-pharmacologic comfort measures Outcome: Progressing   Problem: Health Behavior/Discharge Planning: Goal: Ability to manage health-related needs will improve Outcome: Progressing   Problem: Clinical Measurements: Goal: Ability to maintain clinical measurements within normal limits will improve Outcome: Progressing Goal: Will remain free from infection Outcome: Progressing Goal: Diagnostic test results will improve Outcome: Progressing Goal: Respiratory complications will improve Outcome: Progressing Goal: Cardiovascular complication will be avoided Outcome: Progressing   Problem: Activity: Goal: Risk for activity intolerance will decrease Outcome: Progressing   Problem: Nutrition: Goal: Adequate nutrition will be maintained Outcome: Progressing   Problem: Elimination: Goal: Will not experience complications related to bowel motility Outcome: Progressing Goal: Will not experience complications related to urinary retention Outcome: Progressing   Problem: Pain Managment: Goal: General experience of comfort will improve Outcome: Progressing   Problem: Safety: Goal: Ability to remain free from injury will improve Outcome: Progressing   Problem: Education: Goal: Knowledge of risk factors and measures for prevention of condition will improve Outcome: Progressing   Problem: Coping: Goal: Psychosocial and spiritual needs will be supported Outcome: Progressing   Problem: Respiratory: Goal: Will maintain a patent airway Outcome: Progressing Goal: Complications related to the disease process, condition or treatment will be avoided or minimized Outcome:  Progressing

## 2021-04-22 DIAGNOSIS — N179 Acute kidney failure, unspecified: Secondary | ICD-10-CM | POA: Diagnosis not present

## 2021-04-22 DIAGNOSIS — U071 COVID-19: Secondary | ICD-10-CM | POA: Diagnosis not present

## 2021-04-22 DIAGNOSIS — I5023 Acute on chronic systolic (congestive) heart failure: Secondary | ICD-10-CM | POA: Diagnosis not present

## 2021-04-22 DIAGNOSIS — E1122 Type 2 diabetes mellitus with diabetic chronic kidney disease: Secondary | ICD-10-CM | POA: Diagnosis not present

## 2021-04-22 LAB — RENAL FUNCTION PANEL
Albumin: 2.5 g/dL — ABNORMAL LOW (ref 3.5–5.0)
Anion gap: 10 (ref 5–15)
BUN: 63 mg/dL — ABNORMAL HIGH (ref 8–23)
CO2: 19 mmol/L — ABNORMAL LOW (ref 22–32)
Calcium: 7.9 mg/dL — ABNORMAL LOW (ref 8.9–10.3)
Chloride: 103 mmol/L (ref 98–111)
Creatinine, Ser: 2.42 mg/dL — ABNORMAL HIGH (ref 0.61–1.24)
GFR, Estimated: 25 mL/min — ABNORMAL LOW (ref >60)
Glucose, Bld: 133 mg/dL — ABNORMAL HIGH (ref 70–99)
Phosphorus: 4 mg/dL (ref 2.5–4.6)
Potassium: 5.1 mmol/L (ref 3.5–5.1)
Sodium: 132 mmol/L — ABNORMAL LOW (ref 135–145)

## 2021-04-22 LAB — GLUCOSE, CAPILLARY
Glucose-Capillary: 125 mg/dL — ABNORMAL HIGH (ref 70–99)
Glucose-Capillary: 117 mg/dL — ABNORMAL HIGH (ref 70–99)
Glucose-Capillary: 108 mg/dL — ABNORMAL HIGH (ref 70–99)
Glucose-Capillary: 106 mg/dL — ABNORMAL HIGH (ref 70–99)

## 2021-04-22 NOTE — Progress Notes (Signed)
[facility] Progress Note    Assessment/ Plan:   Acute kidney injury on CKD3a followed by [facility] (Dr. Jimmy Footman) at one point with kidney disease thought to be secondary to diabetes and hypertensive nephrosclerosis. He's had 3+ proteinuria before this current hospitalization.  - Urine microscopy not c/w ATN.; only 1-2 WBC/HPF and no RBC's with no pigmented casts and would think more likely to be hemodynamically mediated but FeNA suggestive of ATN.  - Renal ultrasound shows e/o kidney disease (pt w/ h/o CKD) but no e/o obstruction. - Started on  HCO3 at 1 tab BID; renal diet with fluid restriction. - Agree with holding Farxiga given the renal dysfunction. - Fortunately no absolute indication for RRT, renal function slowly improving - Appears UOP improving- will hold off on Lasix for now- follow UOP and daily weights. -Monitor Daily I/Os, Daily weight  -Maintain MAP>65 for optimal renal perfusion.  -Avoid nephrotoxic medications including NSAIDs - Dose medications for GFR <30 for now.   COVID PNA on steroids and Remdesivir with improving CRP trends weaned off supplemental O2. HFrEF 45-50% with lower extremity edema which he states has been present for a few weeks now; he claims this was not present a few months ago. Hyperkalemia secondary to renal dysfunction - mild grade given Lokelma CAD appears to be stable. DM  Subjective:    Seen in room.  Good UOP yesterday without Lasix, 1.7L.     Objective:   BP 129/74 (BP Location: Right Arm)    Pulse 93    Temp 97.7 F (36.5 C) (Oral)    Resp 18    Ht 5\' 7"  (1.702 m)    Wt 84.5 kg    SpO2 93%    BMI 29.18 kg/m   Intake/Output Summary (Last 24 hours) at 04/22/2021 1548 Last data filed at 04/22/2021 0517 Gross per 24 hour  Intake 120 ml  Output 1725 ml  Net -1605 ml   Weight change:   Physical Exam: General appearance: sleeping, easily arousable Resp: poor air movement bilaterally but on RA Cardio: regular rate and  rhythm GI: soft, non-tender; bowel sounds normal; no masses,  no organomegaly Extremities: 1+ LE edema Pulses: 2+ and symmetric  Imaging: No results found.  Labs: BMET Recent Labs  Lab 04/16/21 1450 04/16/21 2005 04/17/21 0459 04/18/21 0121 04/19/21 0214 04/19/21 1414 04/20/21 0045 04/21/21 0107 04/22/21 0128  NA 134* 136 134* 130* 129*  --  130* 131* 132*  K 4.5 5.1 4.6 5.0 5.3*  --  4.6 4.9 5.1  CL 104  --  105 105 102  --  97* 101 103  CO2 18*  --  18* 18* 15*  --  17* 18* 19*  GLUCOSE 72  --  105* 205* 107*  --  123* 151* 133*  BUN 34*  --  37* 47* 59*  --  66* 68* 63*  CREATININE 1.66*  --  1.65* 1.94* 2.36*  --  2.70* 2.56* 2.42*  CALCIUM 8.6*  --  7.7* 7.6* 7.6*  --  7.8* 7.5* 7.9*  PHOS  --   --  3.5  --   --  4.5  --  4.1 4.0   CBC Recent Labs  Lab 04/16/21 1450 04/16/21 2005 04/19/21 0214  WBC 11.5*  --  18.3*  NEUTROABS 10.5*  --   --   HGB 15.8 15.0 12.4*  HCT 46.4 44.0 36.6*  MCV 90.1  --  88.0  PLT 225  --  269  Medications:     allopurinol  100 mg Oral Daily   apixaban  2.5 mg Oral BID   atorvastatin  80 mg Oral Daily   citalopram  20 mg Oral Daily   clopidogrel  75 mg Oral Q breakfast   dexamethasone (DECADRON) injection  6 mg Intravenous Q24H   ezetimibe  10 mg Oral Daily   insulin aspart  0-9 Units Subcutaneous TID WC   Ipratropium-Albuterol  1 puff Inhalation BID   sodium bicarbonate  650 mg Oral BID   sodium chloride flush  3 mL Intravenous Q12H     Madelon Lips MD 04/22/2021, 3:48 PM

## 2021-04-22 NOTE — Progress Notes (Signed)
[facility] PROGRESS NOTE   Jaymes Hang RCV:[id number] DOB: [birth year] DOA: 04/16/2021  PCP: [name], MD  Brief History/Interval Summary: 88 y.o. male with medical history significant of CAD with stenting on Plavix and Eliquis regimen, chronic systolic CHF LVEF 10-25%, CKD stage IIIa, HTN, HLD, BPH, IIDM, presented with increasing shortness of breath and cough and leg swelling.  Patient tested positive for COVID-19.  Imaging studies showed left upper lobe consolidation and bilateral groundglass opacities.  Patient was hospitalized for further management.  Reason for Visit: Pneumonia due to COVID-19.  Acute systolic CHF.  Acute kidney injury  Consultants: Nephrology  Procedures: None    Subjective/Interval History: Patient continues to feel well.  Denies any shortness of breath.  Slept well overnight.    Assessment/Plan:  Pneumonia due to COVID-19/sepsis, present on admission Patient had sepsis criteria on admission.  Noted to be tachypneic, tachycardic with elevated WBC.  He also had elevated lactic acid level. It is felt that his presentation was primarily due to COVID-19 with some concern for secondary bacterial infection.  Procalcitonin was 0.8. Had mild hemoptysis which could be from pneumonia.  Patient does not have any symptoms or signs otherwise suggestive of thromboembolic process.  He is already on anticoagulation for a flutter. However his D-dimer was noted to be elevated which could from COVID-19.  A lower extremity Doppler study was negative for DVT.   CRP was elevated at 12.7 and then improved to 5.7.  Leukocytosis due to steroids. Patient has completed 5 days of Remdesivir.  He has also completed 5 days of ceftriaxone.   Continue with dexamethasone to complete a 10-day course. Patient's respiratory status has improved.  He seems to be back to baseline now.  Saturating normal on room air.  Acute respiratory failure with hypoxia Initially due  to pneumonia from COVID-19 and subsequently from fluid overload.  Now back to baseline.  Off of oxygen.  Acute on chronic kidney disease stage IIIa/hyponatremia/hyperkalemia Baseline creatinine seems to be around 1.3-1.6.   Renal function worsened and creatinine went up to 2.70 .  He also has hyponatremia and hyperkalemia.  He was given Lokelma. Sodium level is stable.  Urine output appears to have picked up.  Creatinine slowly improving.  Nephrology continues to follow.  Lasix is on hold.  Remains on sodium bicarbonate. Potassium level 5.1 today.  Renal ultrasound did not show hydronephrosis  Acute on chronic systolic CHF/moderate to severe mitral regurgitation EF is noted to be 45 to 50% on echocardiogram done in April 2022.  He was noted to have lower extremity edema.  He was given a low-dose of furosemide.  Had drop in his blood pressure. Echocardiogram was repeated since patient remains dyspneic with concern for fluid overload. Continues to show EF of 45 to 50% with global hypokinesis of the left ventricle.  Right ventricular function was normal.  Moderately elevated pulmonary artery systolic pressures were noted.  Moderate to severe MR was noted.  MR was mild on echocardiogram of April 2022.  Will need to be seen by cardiology but could be done in the outpatient setting.  He is followed by [facility] cardiology. Blood pressure has stabilized.  Cortisol level was 8.9. Cardiac medications on hold due to low blood pressures.  Prior to admission he was on amlodipine, furosemide.   Not on beta-blocker due to heart block.  Not on ACE inhibitor or ARB or Entresto due to history of angioedema on ACE inhibitor and ARB.  Acute metabolic encephalopathy  Likely due to COVID-19.  Seems to have resolved.  Back to baseline now.  History of coronary artery disease Stable.  Continue Plavix.  Diabetes mellitus type 2, with renal complications, with chronic kidney disease stage IIIa He was noted to have  hypoglycemia initially.  Wilder Glade was held.  CBGs have stabilized.  HbA1c 6.2.    History of a flutter, unspecified Followed by cardiology as outpatient.  On Eliquis for stroke prevention.   DVT Prophylaxis: On Eliquis Code Status: Full code Family Communication: No family at bedside.  Discussed with patient Disposition Plan: SNF recommended by physical therapy.  Hopefully discharge in 24 to 48 hours.  Status is: Inpatient  Remains inpatient appropriate because: Acute on chronic systolic CHF, pneumonia due to COVID-19    Medications: Scheduled:  allopurinol  100 mg Oral Daily   apixaban  2.5 mg Oral BID   atorvastatin  80 mg Oral Daily   citalopram  20 mg Oral Daily   clopidogrel  75 mg Oral Q breakfast   dexamethasone (DECADRON) injection  6 mg Intravenous Q24H   ezetimibe  10 mg Oral Daily   insulin aspart  0-9 Units Subcutaneous TID WC   Ipratropium-Albuterol  1 puff Inhalation BID   sodium bicarbonate  650 mg Oral BID   sodium chloride flush  3 mL Intravenous Q12H   Continuous:  sodium chloride     ZSW:FUXNAT chloride, acetaminophen, guaiFENesin-dextromethorphan, ondansetron (ZOFRAN) IV, sodium chloride flush  Antibiotics: Anti-infectives (From admission, onward)    Start     Dose/Rate Route Frequency Ordered Stop   04/17/21 1000  remdesivir 100 mg in sodium chloride 0.9 % 100 mL IVPB       See Hyperspace for full Linked Orders Report.   100 mg 200 mL/hr over 30 Minutes Intravenous Daily 04/16/21 1640 04/20/21 1118   04/17/21 1000  cefTRIAXone (ROCEPHIN) 1 g in sodium chloride 0.9 % 100 mL IVPB        1 g 200 mL/hr over 30 Minutes Intravenous Every 24 hours 04/16/21 1734 04/21/21 1219   04/16/21 2000  remdesivir 200 mg in sodium chloride 0.9% 250 mL IVPB       See Hyperspace for full Linked Orders Report.   200 mg 580 mL/hr over 30 Minutes Intravenous Once 04/16/21 1640 04/16/21 2039   04/16/21 1500  doxycycline (VIBRAMYCIN) 100 mg in sodium chloride 0.9 % 250 mL  IVPB        100 mg 125 mL/hr over 120 Minutes Intravenous  Once 04/16/21 1448 04/16/21 1912   04/16/21 1445  cefTRIAXone (ROCEPHIN) 1 g in sodium chloride 0.9 % 100 mL IVPB        1 g 200 mL/hr over 30 Minutes Intravenous  Once 04/16/21 1441 04/16/21 1659   04/16/21 1445  azithromycin (ZITHROMAX) 500 mg in sodium chloride 0.9 % 250 mL IVPB  Status:  Discontinued        500 mg 250 mL/hr over 60 Minutes Intravenous  Once 04/16/21 1441 04/16/21 1448       Objective:  Vital Signs  Vitals:   04/21/21 2000 04/21/21 2322 04/22/21 0344 04/22/21 0828  BP: 133/63 122/79 129/77 129/76  Pulse: 90 83 85 75  Resp: 15 15 19 14   Temp: (!) 96.9 F (36.1 C) 97.6 F (36.4 C) (!) 97.4 F (36.3 C) 97.6 F (36.4 C)  TempSrc: Oral Oral Oral Oral  SpO2: 93% 97% 97% 96%  Weight:      Height:  Intake/Output Summary (Last 24 hours) at 04/22/2021 1041 Last data filed at 04/22/2021 0517 Gross per 24 hour  Intake 120 ml  Output 1725 ml  Net -1605 ml    Filed Weights   04/18/21 0400 04/19/21 0437 04/21/21 0457  Weight: 83.2 kg 82.8 kg 84.5 kg    General appearance: Awake alert.  In no distress Resp: Clear to auscultation bilaterally.  Normal effort Cardio: S1-S2 is normal regular.  No S3-S4.  No rubs murmurs or bruit GI: Abdomen is soft.  Nontender nondistended.  Bowel sounds are present normal.  No masses organomegaly Extremities: Improvement in bilateral lower extremity edema Neurologic: Alert and oriented x3.  No focal neurological deficits.     Lab Results:  Data Reviewed: I have personally reviewed following labs and imaging studies  CBC: Recent Labs  Lab 04/16/21 1450 04/16/21 2005 04/19/21 0214  WBC 11.5*  --  18.3*  NEUTROABS 10.5*  --   --   HGB 15.8 15.0 12.4*  HCT 46.4 44.0 36.6*  MCV 90.1  --  88.0  PLT 225  --  269     Basic Metabolic Panel: Recent Labs  Lab 04/16/21 1450 04/16/21 2005 04/17/21 0459 04/18/21 0121 04/19/21 0214 04/19/21 1414  04/20/21 0045 04/21/21 0107 04/22/21 0128  NA 134*   < > 134* 130* 129*  --  130* 131* 132*  K 4.5   < > 4.6 5.0 5.3*  --  4.6 4.9 5.1  CL 104  --  105 105 102  --  97* 101 103  CO2 18*  --  18* 18* 15*  --  17* 18* 19*  GLUCOSE 72  --  105* 205* 107*  --  123* 151* 133*  BUN 34*  --  37* 47* 59*  --  66* 68* 63*  CREATININE 1.66*  --  1.65* 1.94* 2.36*  --  2.70* 2.56* 2.42*  CALCIUM 8.6*  --  7.7* 7.6* 7.6*  --  7.8* 7.5* 7.9*  MG 2.0  --  1.7  --   --   --   --   --   --   PHOS  --   --  3.5  --   --  4.5  --  4.1 4.0   < > = values in this interval not displayed.     GFR: Estimated Creatinine Clearance: 21.9 mL/min (A) (by C-G formula based on SCr of 2.42 mg/dL (H)).  Liver Function Tests: Recent Labs  Lab 04/16/21 1450 04/21/21 0107 04/22/21 0128  AST 62*  --   --   ALT 30  --   --   ALKPHOS 83  --   --   BILITOT 2.4*  --   --   PROT 7.8  --   --   ALBUMIN 3.2* 2.4* 2.5*      Coagulation Profile: Recent Labs  Lab 04/16/21 1450  INR 1.1    CBG: Recent Labs  Lab 04/21/21 0747 04/21/21 1148 04/21/21 1557 04/21/21 2110 04/22/21 0830  GLUCAP 116* 115* 140* 139* 125*       Recent Results (from the past 240 hour(s))  Blood Culture (routine x 2)     Status: None   Collection Time: 04/16/21  2:17 PM   Specimen: Left Antecubital; Blood  Result Value Ref Range Status   Specimen Description LEFT ANTECUBITAL  Final   Special Requests   Final    BOTTLES DRAWN AEROBIC AND ANAEROBIC Blood Culture results may not be optimal due to  an inadequate volume of blood received in culture bottles   Culture   Final    NO GROWTH 5 DAYS Performed at [facility], [place] [address], [place], [place] [address]    Report Status 04/21/2021 FINAL  Final  Resp Panel by RT-PCR (Flu A&B, Covid) Nasopharyngeal Swab     Status: Abnormal   Collection Time: 04/16/21  2:54 PM   Specimen: Nasopharyngeal Swab; Nasopharyngeal(NP) swabs in vial transport medium  Result Value Ref  Range Status   SARS Coronavirus 2 by RT PCR POSITIVE (A) NEGATIVE Final    Comment: (NOTE) SARS-CoV-2 target nucleic acids are DETECTED.  The SARS-CoV-2 RNA is generally detectable in upper respiratory specimens during the acute phase of infection. Positive results are indicative of the presence of the identified virus, but do not rule out bacterial infection or co-infection with other pathogens not detected by the test. Clinical correlation with patient history and other diagnostic information is necessary to determine patient infection status. The expected result is Negative.  Fact Sheet for Patients: EntrepreneurPulse.com.au  Fact Sheet for Healthcare Providers: IncredibleEmployment.be  This test is not yet approved or cleared by the [place] FDA and  has been authorized for detection and/or diagnosis of SARS-CoV-2 by FDA under an Emergency Use Authorization (EUA).  This EUA will remain in effect (meaning this test can be used) for the duration of  the COVID-19 declaration under Section 564(b)(1) of the A ct, 21 U.S.C. section 360bbb-3(b)(1), unless the authorization is terminated or revoked sooner.     Influenza A by PCR NEGATIVE NEGATIVE Final   Influenza B by PCR NEGATIVE NEGATIVE Final    Comment: (NOTE) The Xpert Xpress SARS-CoV-2/FLU/RSV plus assay is intended as an aid in the diagnosis of influenza from Nasopharyngeal swab specimens and should not be used as a sole basis for treatment. Nasal washings and aspirates are unacceptable for Xpert Xpress SARS-CoV-2/FLU/RSV testing.  Fact Sheet for Patients: EntrepreneurPulse.com.au  Fact Sheet for Healthcare Providers: IncredibleEmployment.be  This test is not yet approved or cleared by the [place] FDA and has been authorized for detection and/or diagnosis of SARS-CoV-2 by FDA under an Emergency Use Authorization (EUA). This EUA will  remain in effect (meaning this test can be used) for the duration of the COVID-19 declaration under Section 564(b)(1) of the Act, 21 U.S.C. section 360bbb-3(b)(1), unless the authorization is terminated or revoked.  Performed at [facility], [place] [address] [place], [place] [address]        Radiology Studies: No results found.     LOS: 6 days   Abbee Cremeens [employer] on [email or web address].[email or web address]  04/22/2021, 10:41 AM

## 2021-04-22 NOTE — Progress Notes (Signed)
Pt refusing CPAP at this time. RT will continue to monitor.  

## 2021-04-23 DIAGNOSIS — N1831 Chronic kidney disease, stage 3a: Secondary | ICD-10-CM | POA: Diagnosis not present

## 2021-04-23 DIAGNOSIS — E1122 Type 2 diabetes mellitus with diabetic chronic kidney disease: Secondary | ICD-10-CM | POA: Diagnosis not present

## 2021-04-23 DIAGNOSIS — U071 COVID-19: Secondary | ICD-10-CM | POA: Diagnosis not present

## 2021-04-23 DIAGNOSIS — N179 Acute kidney failure, unspecified: Secondary | ICD-10-CM | POA: Diagnosis not present

## 2021-04-23 LAB — CBC
HCT: 36.4 % — ABNORMAL LOW (ref 39.0–52.0)
Hemoglobin: 12.9 g/dL — ABNORMAL LOW (ref 13.0–17.0)
MCH: 30.2 pg (ref 26.0–34.0)
MCHC: 35.4 g/dL (ref 30.0–36.0)
MCV: 85.2 fL (ref 80.0–100.0)
Platelets: 383 10*3/uL (ref 150–400)
RBC: 4.27 MIL/uL (ref 4.22–5.81)
RDW: 17.3 % — ABNORMAL HIGH (ref 11.5–15.5)
WBC: 6.8 10*3/uL (ref 4.0–10.5)
nRBC: 0 % (ref 0.0–0.2)

## 2021-04-23 LAB — RENAL FUNCTION PANEL
Albumin: 2.3 g/dL — ABNORMAL LOW (ref 3.5–5.0)
Anion gap: 8 (ref 5–15)
BUN: 59 mg/dL — ABNORMAL HIGH (ref 8–23)
CO2: 21 mmol/L — ABNORMAL LOW (ref 22–32)
Calcium: 7.8 mg/dL — ABNORMAL LOW (ref 8.9–10.3)
Chloride: 105 mmol/L (ref 98–111)
Creatinine, Ser: 2.23 mg/dL — ABNORMAL HIGH (ref 0.61–1.24)
GFR, Estimated: 28 mL/min — ABNORMAL LOW (ref >60)
Glucose, Bld: 140 mg/dL — ABNORMAL HIGH (ref 70–99)
Phosphorus: 3.7 mg/dL (ref 2.5–4.6)
Potassium: 4.6 mmol/L (ref 3.5–5.1)
Sodium: 134 mmol/L — ABNORMAL LOW (ref 135–145)

## 2021-04-23 LAB — GLUCOSE, CAPILLARY
Glucose-Capillary: 99 mg/dL (ref 70–99)
Glucose-Capillary: 137 mg/dL — ABNORMAL HIGH (ref 70–99)
Glucose-Capillary: 135 mg/dL — ABNORMAL HIGH (ref 70–99)
Glucose-Capillary: 219 mg/dL — ABNORMAL HIGH (ref 70–99)

## 2021-04-23 NOTE — TOC Progression Note (Signed)
Transition of Care [facility]) - Progression Note    Patient Details  Name: Kevin Schwartz MRN: [id number] Date of Birth: 19-Apr-1930  Transition of Care [facility]) CM/SW [place], LCSW Phone Number: 04/23/2021, 12:00 PM  Clinical Narrative:    CSW spoke with patient's son and provided current bed offers, emphasizing that this will be a rehab stay only. He reported understanding and stated that his sister Kevin Schwartz has offered for patient to stay with her afterwards. He is going to touch base with Kevin Schwartz and go over the facilities.    Expected Discharge Plan: Skilled Nursing Facility Barriers to Discharge: SNF Covid  Expected Discharge Plan and Services Expected Discharge Plan: [place] In-house Referral: Clinical Social Work   Post Acute Care Choice: [place] Living arrangements for the past 2 months: Hotel/Motel                                       Social Determinants of Health (SDOH) Interventions    Readmission Risk Interventions No flowsheet data found.

## 2021-04-23 NOTE — Progress Notes (Signed)
OT Cancellation Note  Patient Details Name: Terryn Redner MRN: [id number] DOB: Mar 10, 1931   Cancelled Treatment:    Reason Eval/Treat Not Completed: Fatigue/lethargy limiting ability to participate. Patient recently returned to bed after sitting up in chair. Fatigued from previous PT session. OT to check back 1/11.   Gloris [place] OTR/L Supplemental OT, Department of rehab services [phone]  Ilaisaane Marts R H. 04/23/2021, 1:52 PM

## 2021-04-23 NOTE — Progress Notes (Signed)
[facility] Progress Note    Assessment/ Plan:   Acute kidney injury on CKD3a followed by [facility] (Dr. Jimmy Footman) at one point with kidney disease thought to be secondary to diabetes and hypertensive nephrosclerosis. He's had 3+ proteinuria before this current hospitalization.  - Cr slowly improving.   - Urine microscopy not c/w ATN.; only 1-2 WBC/HPF and no RBC's with no pigmented casts and would think more likely to be hemodynamically mediated but FeNA suggestive of ATN.  - Renal ultrasound shows e/o kidney disease (pt w/ h/o CKD) but no e/o obstruction. - Started on  HCO3 at 1 tab BID; renal diet with fluid restriction. - holding Wilder Glade given the renal dysfunction. - Fortunately no absolute indication for RRT, renal function slowly improving - Appears UOP improving- will hold off on Lasix for now- follow UOP and daily weights. -Monitor Daily I/Os, Daily weight  -Maintain MAP>65 for optimal renal perfusion.  -Avoid nephrotoxic medications including NSAIDs - Dose medications for GFR <30 for now.   COVID PNA on steroids and Remdesivir with improving CRP trends weaned off supplemental O2. HFrEF 45-50%: off Lasix for now Hyperkalemia secondary to renal dysfunction - mild grade given Lokelma, resolved CAD appears to be stable. DM  Subjective:    On RA, doing OK.  Cr still coming down.     Objective:   BP 130/76 (BP Location: Right Arm)    Pulse 92    Temp 97.6 F (36.4 C) (Oral)    Resp 17    Ht 5\' 7"  (1.702 m)    Wt 82.4 kg    SpO2 96%    BMI 28.45 kg/m   Intake/Output Summary (Last 24 hours) at 04/23/2021 1541 Last data filed at 04/23/2021 0449 Gross per 24 hour  Intake 360 ml  Output 1050 ml  Net -690 ml   Weight change:   Physical Exam: General appearance: sleeping, easily arousable Resp: poor air movement bilaterally but on RA Cardio: regular rate and rhythm GI: soft, non-tender; bowel sounds normal; no masses,  no organomegaly Extremities: trace- 1+ LE  edema Pulses: 2+ and symmetric  Imaging: No results found.  Labs: BMET Recent Labs  Lab 04/17/21 0459 04/18/21 0121 04/19/21 0214 04/19/21 1414 04/20/21 0045 04/21/21 0107 04/22/21 0128 04/23/21 0040  NA 134* 130* 129*  --  130* 131* 132* 134*  K 4.6 5.0 5.3*  --  4.6 4.9 5.1 4.6  CL 105 105 102  --  97* 101 103 105  CO2 18* 18* 15*  --  17* 18* 19* 21*  GLUCOSE 105* 205* 107*  --  123* 151* 133* 140*  BUN 37* 47* 59*  --  66* 68* 63* 59*  CREATININE 1.65* 1.94* 2.36*  --  2.70* 2.56* 2.42* 2.23*  CALCIUM 7.7* 7.6* 7.6*  --  7.8* 7.5* 7.9* 7.8*  PHOS 3.5  --   --  4.5  --  4.1 4.0 3.7   CBC Recent Labs  Lab 04/16/21 2005 04/19/21 0214 04/23/21 0040  WBC  --  18.3* 6.8  HGB 15.0 12.4* 12.9*  HCT 44.0 36.6* 36.4*  MCV  --  88.0 85.2  PLT  --  269 383    Medications:     allopurinol  100 mg Oral Daily   apixaban  2.5 mg Oral BID   atorvastatin  80 mg Oral Daily   citalopram  20 mg Oral Daily   clopidogrel  75 mg Oral Q breakfast   dexamethasone (DECADRON) injection  6 mg  Intravenous Q24H   ezetimibe  10 mg Oral Daily   insulin aspart  0-9 Units Subcutaneous TID WC   Ipratropium-Albuterol  1 puff Inhalation BID   sodium bicarbonate  650 mg Oral BID   sodium chloride flush  3 mL Intravenous Q12H     Madelon Lips MD 04/23/2021, 3:41 PM

## 2021-04-23 NOTE — Progress Notes (Signed)
Physical Therapy Treatment Patient Details Name: Kevin Schwartz MRN: [id number] DOB: 1931-01-13 Today's Date: 04/23/2021   History of Present Illness 88 y/o male admitted 1/3 secondary to weakness and LE swelling. Pt also with covid PNA. RR called 1/5 for low body temp, improving now.  PMHx:  CAD, CHF, CKD, HTN, DM.    PT Comments    Pt received in bed, agreeable to participation in therapy. He required mod assist bed mobility, mod assist sit to stand with RW, and mod assist step pivot transfer bed to recliner with RW. Pt performed LE seated exercises. VSS on RA. Pt in recliner with feet elevated at end of session.   Recommendations for follow up therapy are one component of a multi-disciplinary discharge planning process, led by the attending physician.  Recommendations may be updated based on patient status, additional functional criteria and insurance authorization.  Follow Up Recommendations  Skilled nursing-short term rehab (<3 hours/day)     Assistance Recommended at Discharge Frequent or constant Supervision/Assistance  Patient can return home with the following A lot of help with walking and/or transfers;A lot of help with bathing/dressing/bathroom;Assistance with cooking/housework;Assist for transportation;Help with stairs or ramp for entrance   Equipment Recommendations  Wheelchair (measurements PT);Wheelchair cushion (measurements PT)    Recommendations for Other Services       Precautions / Restrictions Precautions Precautions: Fall     Mobility  Bed Mobility Overal bed mobility: Needs Assistance Bed Mobility: Supine to Sit     Supine to sit: Mod assist;HOB elevated     General bed mobility comments: +rail, increased time, cues for sequencing    Transfers Overall transfer level: Needs assistance Equipment used: Rolling walker (2 wheels) Transfers: Sit to/from Stand;Bed to chair/wheelchair/BSC Sit to Stand: Mod assist     Step pivot transfers: Mod  assist     General transfer comment: assist to power up and stabilize balance. Increased time. Pt able to take pivot steps with RW bed to recliner.    Ambulation/Gait                   Stairs             Wheelchair Mobility    Modified Rankin (Stroke Patients Only)       Balance Overall balance assessment: Needs assistance Sitting-balance support: No upper extremity supported;Feet supported Sitting balance-[name]: Fair     Standing balance support: Bilateral upper extremity supported;During functional activity;Reliant on assistive device for balance Standing balance-[name]: Poor                              Cognition Arousal/Alertness: Awake/alert Behavior During Therapy: Flat affect;WFL for tasks assessed/performed Overall Cognitive Status: Within Functional Limits for tasks assessed                                 General Comments: [facility]        Exercises General Exercises - Lower Extremity Ankle Circles/Pumps: AROM;Both;10 reps;Seated Long Arc Quad: AROM;Right;Left;10 reps;Seated Hip Flexion/Marching: AROM;Right;Left;10 reps;Seated    General Comments General comments (skin integrity, edema, etc.): VSS on RA      Pertinent Vitals/Pain Pain Assessment: No/denies pain    Home Living                          Prior Function  PT Goals (current goals can now be found in the care plan section) Acute Rehab PT Goals Patient Stated Goal: to go home Progress towards PT goals: Progressing toward goals    Frequency    Min 3X/week      PT Plan Current plan remains appropriate    Co-evaluation              AM-PAC PT "6 Clicks" Mobility   Outcome Measure  Help needed turning from your back to your side while in a flat bed without using bedrails?: A Lot Help needed moving from lying on your back to sitting on the side of a flat bed without using bedrails?: A Lot Help needed  moving to and from a bed to a chair (including a wheelchair)?: A Lot Help needed standing up from a chair using your arms (e.g., wheelchair or bedside chair)?: A Lot Help needed to walk in hospital room?: Total Help needed climbing 3-5 steps with a railing? : Total 6 Click Score: 10    End of Session Equipment Utilized During Treatment: Gait belt Activity Tolerance: Patient tolerated treatment well Patient left: in chair;with call bell/phone within reach Nurse Communication: Mobility status;Other (comment) (no batteries in chair alarm) PT Visit Diagnosis: Muscle weakness (generalized) (M62.81);Other symptoms and signs involving the nervous system (R29.898);Difficulty in walking, not elsewhere classified (R26.2);Other abnormalities of gait and mobility (R26.89)     Time: 9539-6728 PT Time Calculation (min) (ACUTE ONLY): 30 min  Charges:  $Gait Training: 8-22 mins $Therapeutic Exercise: 8-22 mins                     Lorrin Goodell, PT  Office # [phone] Pager [phone]    Lorriane Shire 04/23/2021, 11:34 AM

## 2021-04-23 NOTE — Progress Notes (Signed)
[facility] PROGRESS NOTE   Kevin Schwartz FGH:[id number] DOB: 1930/07/19 DOA: 04/16/2021  PCP: [name], MD  Brief History/Interval Summary: 88 y.o. male with medical history significant of CAD with stenting on Plavix and Eliquis regimen, chronic systolic CHF LVEF 67-89%, CKD stage IIIa, HTN, HLD, BPH, IIDM, presented with increasing shortness of breath and cough and leg swelling.  Patient tested positive for COVID-19.  Imaging studies showed left upper lobe consolidation and bilateral groundglass opacities.  Patient was hospitalized for further management.  Given Remdesivir and steroids.  Subsequently developed acute kidney injury.  Nephrology was consulted.  Seems to be gradually improving.  Will need to go to SNF when medically ready for discharge.  Reason for Visit: Pneumonia due to COVID-19.  Acute systolic CHF.  Acute kidney injury  Consultants: Nephrology  Procedures: None    Subjective/Interval History: Patient denies any complaints.  Has been urinating well.  Denies any shortness of breath or chest pain.  No nausea vomiting.    Assessment/Plan:  Pneumonia due to COVID-19/sepsis, present on admission Patient had sepsis criteria on admission.  Noted to be tachypneic, tachycardic with elevated WBC.  He also had elevated lactic acid level. It is felt that his presentation was primarily due to COVID-19 with some concern for secondary bacterial infection.  Procalcitonin was 0.8. Had mild hemoptysis which could be from pneumonia.  Patient does not have any symptoms or signs otherwise suggestive of thromboembolic process.  He is already on anticoagulation for a flutter. However his D-dimer was noted to be elevated which could from COVID-19.  A lower extremity Doppler study was negative for DVT.   CRP was elevated at 12.7 and then improved to 5.7.  Leukocytosis was due to steroids and has improved. Patient has completed 5 days of Remdesivir.  He has also completed  5 days of ceftriaxone.   Continue with dexamethasone to complete a 10-day course. Patient' patient's respiratory status is stable.  He seems to be back to baseline.  Saturating normal on room air.  Acute respiratory failure with hypoxia Initially due to pneumonia from COVID-19 and subsequently from fluid overload.  Now back to baseline.  Off of oxygen.  Acute on chronic kidney disease stage IIIa/hyponatremia/hyperkalemia Baseline creatinine seems to be around 1.3-1.6.   Renal function worsened and creatinine went up to 2.70 .  He also has hyponatremia and hyperkalemia.  He was given Lokelma. Acute kidney injury thought to be ATN from COVID-19. Renal ultrasound did not show hydronephrosis. Creatinine slowly improving.  Urine output is picked up.  Lasix remains on hold.  Remains on sodium bicarbonate.  Nephrology is following.  Potassium noted to be 4.6 today.  Sodium level has improved.  Acute on chronic systolic CHF/moderate to severe mitral regurgitation EF is noted to be 45 to 50% on echocardiogram done in April 2022.  He was noted to have lower extremity edema.  He was given a low-dose of furosemide.  Had drop in his blood pressure. Echocardiogram was repeated since patient remains dyspneic with concern for fluid overload. Continues to show EF of 45 to 50% with global hypokinesis of the left ventricle.  Right ventricular function was normal.  Moderately elevated pulmonary artery systolic pressures were noted.  Moderate to severe MR was noted.  MR was mild on echocardiogram of April 2022.  Will need to be seen by cardiology but could be done in the outpatient setting.  He is followed by [facility] cardiology. Blood pressure has stabilized.  Cortisol level  was 8.9. Cardiac medications on hold due to low blood pressures.  Prior to admission he was on amlodipine, furosemide.  Will not reinitiate at this time as we wait for kidneys to recover. Not on beta-blocker due to heart block.  Not on ACE inhibitor  or ARB or Entresto due to history of angioedema on ACE inhibitor and ARB.  Acute metabolic encephalopathy Likely due to COVID-19.  Seems to have resolved.  Back to baseline now.  History of coronary artery disease Stable.  Continue Plavix.  Diabetes mellitus type 2, with renal complications, with chronic kidney disease stage IIIa He was noted to have hypoglycemia initially.  Wilder Glade was held.  CBGs have stabilized.  HbA1c 6.2.    History of a flutter, unspecified Followed by cardiology as outpatient.  On Eliquis for stroke prevention.   DVT Prophylaxis: On Eliquis Code Status: Full code Family Communication: No family at bedside.  Discussed with patient Disposition Plan: SNF recommended by physical therapy.  Hopefully discharge in 24 to 48 hours.  Status is: Inpatient  Remains inpatient appropriate because: Acute kidney injury, pneumonia due to COVID-19    Medications: Scheduled:  allopurinol  100 mg Oral Daily   apixaban  2.5 mg Oral BID   atorvastatin  80 mg Oral Daily   citalopram  20 mg Oral Daily   clopidogrel  75 mg Oral Q breakfast   dexamethasone (DECADRON) injection  6 mg Intravenous Q24H   ezetimibe  10 mg Oral Daily   insulin aspart  0-9 Units Subcutaneous TID WC   Ipratropium-Albuterol  1 puff Inhalation BID   sodium bicarbonate  650 mg Oral BID   sodium chloride flush  3 mL Intravenous Q12H   Continuous:  sodium chloride     QIH:KVQQVZ chloride, acetaminophen, guaiFENesin-dextromethorphan, ondansetron (ZOFRAN) IV, sodium chloride flush  Antibiotics: Anti-infectives (From admission, onward)    Start     Dose/Rate Route Frequency Ordered Stop   04/17/21 1000  remdesivir 100 mg in sodium chloride 0.9 % 100 mL IVPB       See Hyperspace for full Linked Orders Report.   100 mg 200 mL/hr over 30 Minutes Intravenous Daily 04/16/21 1640 04/20/21 1118   04/17/21 1000  cefTRIAXone (ROCEPHIN) 1 g in sodium chloride 0.9 % 100 mL IVPB        1 g 200 mL/hr over 30  Minutes Intravenous Every 24 hours 04/16/21 1734 04/21/21 1219   04/16/21 2000  remdesivir 200 mg in sodium chloride 0.9% 250 mL IVPB       See Hyperspace for full Linked Orders Report.   200 mg 580 mL/hr over 30 Minutes Intravenous Once 04/16/21 1640 04/16/21 2039   04/16/21 1500  doxycycline (VIBRAMYCIN) 100 mg in sodium chloride 0.9 % 250 mL IVPB        100 mg 125 mL/hr over 120 Minutes Intravenous  Once 04/16/21 1448 04/16/21 1912   04/16/21 1445  cefTRIAXone (ROCEPHIN) 1 g in sodium chloride 0.9 % 100 mL IVPB        1 g 200 mL/hr over 30 Minutes Intravenous  Once 04/16/21 1441 04/16/21 1659   04/16/21 1445  azithromycin (ZITHROMAX) 500 mg in sodium chloride 0.9 % 250 mL IVPB  Status:  Discontinued        500 mg 250 mL/hr over 60 Minutes Intravenous  Once 04/16/21 1441 04/16/21 1448       Objective:  Vital Signs  Vitals:   04/23/21 0000 04/23/21 0400 04/23/21 0445 04/23/21 0742  BP: 124/69  116/78  124/73  Pulse: 88 71  84  Resp: 13 16  19   Temp: 97.6 F (36.4 C) 97.7 F (36.5 C)  97.6 F (36.4 C)  TempSrc: Oral Oral  Oral  SpO2: 95% 97%  93%  Weight:   82.4 kg   Height:        Intake/Output Summary (Last 24 hours) at 04/23/2021 0844 Last data filed at 04/23/2021 0449 Gross per 24 hour  Intake 360 ml  Output 1050 ml  Net -690 ml    Filed Weights   04/19/21 0437 04/21/21 0457 04/23/21 0445  Weight: 82.8 kg 84.5 kg 82.4 kg    General appearance: Awake alert.  In no distress Resp: Clear to auscultation bilaterally.  Normal effort Cardio: S1-S2 is normal regular.  No S3-S4.  No rubs murmurs or bruit GI: Abdomen is soft.  Nontender nondistended.  Bowel sounds are present normal.  No masses organomegaly Extremities: Improvement in lower extremity edema.  Physical deconditioning is noted. Neurologic: Alert and oriented x3.  No focal neurological deficits.      Lab Results:  Data Reviewed: I have personally reviewed following labs and imaging  studies  CBC: Recent Labs  Lab 04/16/21 1450 04/16/21 2005 04/19/21 0214 04/23/21 0040  WBC 11.5*  --  18.3* 6.8  NEUTROABS 10.5*  --   --   --   HGB 15.8 15.0 12.4* 12.9*  HCT 46.4 44.0 36.6* 36.4*  MCV 90.1  --  88.0 85.2  PLT 225  --  269 383     Basic Metabolic Panel: Recent Labs  Lab 04/16/21 1450 04/16/21 2005 04/17/21 0459 04/18/21 0121 04/19/21 0214 04/19/21 1414 04/20/21 0045 04/21/21 0107 04/22/21 0128 04/23/21 0040  NA 134*   < > 134*   < > 129*  --  130* 131* 132* 134*  K 4.5   < > 4.6   < > 5.3*  --  4.6 4.9 5.1 4.6  CL 104  --  105   < > 102  --  97* 101 103 105  CO2 18*  --  18*   < > 15*  --  17* 18* 19* 21*  GLUCOSE 72  --  105*   < > 107*  --  123* 151* 133* 140*  BUN 34*  --  37*   < > 59*  --  66* 68* 63* 59*  CREATININE 1.66*  --  1.65*   < > 2.36*  --  2.70* 2.56* 2.42* 2.23*  CALCIUM 8.6*  --  7.7*   < > 7.6*  --  7.8* 7.5* 7.9* 7.8*  MG 2.0  --  1.7  --   --   --   --   --   --   --   PHOS  --   --  3.5  --   --  4.5  --  4.1 4.0 3.7   < > = values in this interval not displayed.     GFR: Estimated Creatinine Clearance: 23.5 mL/min (A) (by C-G formula based on SCr of 2.23 mg/dL (H)).  Liver Function Tests: Recent Labs  Lab 04/16/21 1450 04/21/21 0107 04/22/21 0128 04/23/21 0040  AST 62*  --   --   --   ALT 30  --   --   --   ALKPHOS 83  --   --   --   BILITOT 2.4*  --   --   --   PROT 7.8  --   --   --  ALBUMIN 3.2* 2.4* 2.5* 2.3*      Coagulation Profile: Recent Labs  Lab 04/16/21 1450  INR 1.1    CBG: Recent Labs  Lab 04/22/21 0830 04/22/21 1227 04/22/21 1630 04/22/21 2033 04/23/21 0745  GLUCAP 125* 117* 108* 106* 99       Recent Results (from the past 240 hour(s))  Blood Culture (routine x 2)     Status: None   Collection Time: 04/16/21  2:17 PM   Specimen: Left Antecubital; Blood  Result Value Ref Range Status   Specimen Description LEFT ANTECUBITAL  Final   Special Requests   Final    BOTTLES  DRAWN AEROBIC AND ANAEROBIC Blood Culture results may not be optimal due to an inadequate volume of blood received in culture bottles   Culture   Final    NO GROWTH 5 DAYS Performed at [facility], [place] [address], [place], [place] [address]    Report Status 04/21/2021 FINAL  Final  Resp Panel by RT-PCR (Flu A&B, Covid) Nasopharyngeal Swab     Status: Abnormal   Collection Time: 04/16/21  2:54 PM   Specimen: Nasopharyngeal Swab; Nasopharyngeal(NP) swabs in vial transport medium  Result Value Ref Range Status   SARS Coronavirus 2 by RT PCR POSITIVE (A) NEGATIVE Final    Comment: (NOTE) SARS-CoV-2 target nucleic acids are DETECTED.  The SARS-CoV-2 RNA is generally detectable in upper respiratory specimens during the acute phase of infection. Positive results are indicative of the presence of the identified virus, but do not rule out bacterial infection or co-infection with other pathogens not detected by the test. Clinical correlation with patient history and other diagnostic information is necessary to determine patient infection status. The expected result is Negative.  Fact Sheet for Patients: EntrepreneurPulse.com.au  Fact Sheet for Healthcare Providers: IncredibleEmployment.be  This test is not yet approved or cleared by the [place] FDA and  has been authorized for detection and/or diagnosis of SARS-CoV-2 by FDA under an Emergency Use Authorization (EUA).  This EUA will remain in effect (meaning this test can be used) for the duration of  the COVID-19 declaration under Section 564(b)(1) of the A ct, 21 U.S.C. section 360bbb-3(b)(1), unless the authorization is terminated or revoked sooner.     Influenza A by PCR NEGATIVE NEGATIVE Final   Influenza B by PCR NEGATIVE NEGATIVE Final    Comment: (NOTE) The Xpert Xpress SARS-CoV-2/FLU/RSV plus assay is intended as an aid in the diagnosis of influenza from Nasopharyngeal swab  specimens and should not be used as a sole basis for treatment. Nasal washings and aspirates are unacceptable for Xpert Xpress SARS-CoV-2/FLU/RSV testing.  Fact Sheet for Patients: EntrepreneurPulse.com.au  Fact Sheet for Healthcare Providers: IncredibleEmployment.be  This test is not yet approved or cleared by the [place] FDA and has been authorized for detection and/or diagnosis of SARS-CoV-2 by FDA under an Emergency Use Authorization (EUA). This EUA will remain in effect (meaning this test can be used) for the duration of the COVID-19 declaration under Section 564(b)(1) of the Act, 21 U.S.C. section 360bbb-3(b)(1), unless the authorization is terminated or revoked.  Performed at [facility], [place] [address], [place], [place] [address]        Radiology Studies: No results found.     LOS: 7 days   Kevin Schwartz [employer] on [email or web address]  04/23/2021, 8:44 AM

## 2021-04-24 DIAGNOSIS — U071 COVID-19: Secondary | ICD-10-CM | POA: Diagnosis not present

## 2021-04-24 DIAGNOSIS — I5021 Acute systolic (congestive) heart failure: Secondary | ICD-10-CM | POA: Diagnosis not present

## 2021-04-24 DIAGNOSIS — J189 Pneumonia, unspecified organism: Secondary | ICD-10-CM | POA: Diagnosis not present

## 2021-04-24 LAB — RENAL FUNCTION PANEL
Albumin: 2.3 g/dL — ABNORMAL LOW (ref 3.5–5.0)
Anion gap: 10 (ref 5–15)
BUN: 55 mg/dL — ABNORMAL HIGH (ref 8–23)
CO2: 19 mmol/L — ABNORMAL LOW (ref 22–32)
Calcium: 7.9 mg/dL — ABNORMAL LOW (ref 8.9–10.3)
Chloride: 103 mmol/L (ref 98–111)
Creatinine, Ser: 2.09 mg/dL — ABNORMAL HIGH (ref 0.61–1.24)
GFR, Estimated: 30 mL/min — ABNORMAL LOW (ref >60)
Glucose, Bld: 203 mg/dL — ABNORMAL HIGH (ref 70–99)
Phosphorus: 3.1 mg/dL (ref 2.5–4.6)
Potassium: 4.6 mmol/L (ref 3.5–5.1)
Sodium: 132 mmol/L — ABNORMAL LOW (ref 135–145)

## 2021-04-24 LAB — GLUCOSE, CAPILLARY
Glucose-Capillary: 162 mg/dL — ABNORMAL HIGH (ref 70–99)
Glucose-Capillary: 141 mg/dL — ABNORMAL HIGH (ref 70–99)
Glucose-Capillary: 118 mg/dL — ABNORMAL HIGH (ref 70–99)
Glucose-Capillary: 137 mg/dL — ABNORMAL HIGH (ref 70–99)

## 2021-04-24 NOTE — Progress Notes (Signed)
[facility] PROGRESS NOTE   Windle Huebert FBP:[id number] DOB: 1931-04-01 DOA: 04/16/2021  PCP: [name], MD  Brief History/Interval Summary: 88 y.o. male with medical history significant of CAD with stenting on Plavix and Eliquis regimen, chronic systolic CHF LVEF 82-42%, CKD stage IIIa, HTN, HLD, BPH, IIDM, presented with increasing shortness of breath and cough and leg swelling.  Patient tested positive for COVID-19.  Imaging studies showed left upper lobe consolidation and bilateral groundglass opacities.  Patient was hospitalized for further management.  Given Remdesivir and steroids.  Subsequently developed acute kidney injury.  Nephrology was consulted.  Seems to be gradually improving.  Will need to go to SNF when medically ready for discharge.  Reason for Visit: Pneumonia due to COVID-19.  Acute systolic CHF.  Acute kidney injury  Consultants: Nephrology  Procedures: None    Subjective/Interval History:  He denies any chest pain, denies any nausea, vomiting or shortness of breath.   Assessment/Plan:  Pneumonia due to COVID-19/sepsis, present on admission Patient had sepsis criteria on admission.  Noted to be tachypneic, tachycardic with elevated WBC.  He also had elevated lactic acid level. It is felt that his presentation was primarily due to COVID-19 with some concern for secondary bacterial infection.  Procalcitonin was 0.8. Had mild hemoptysis which could be from pneumonia.  Patient does not have any symptoms or signs otherwise suggestive of thromboembolic process.  He is already on anticoagulation for a flutter. However his D-dimer was noted to be elevated which could from COVID-19.  A lower extremity Doppler study was negative for DVT.   CRP was elevated at 12.7 and then improved to 5.7.  Leukocytosis was due to steroids and has improved. Patient has completed 5 days of Remdesivir.  He has also completed 5 days of ceftriaxone.   Continue with  dexamethasone to complete a 10-day course. Patient' patient's respiratory status is stable.  He seems to be back to baseline.  Saturating normal on room air.  Acute respiratory failure with hypoxia Initially due to pneumonia from COVID-19 and subsequently from fluid overload.  Now back to baseline.  Off of oxygen. - resolved  Acute on chronic kidney disease stage IIIa/hyponatremia/hyperkalemia Baseline creatinine seems to be around 1.3-1.6.   Renal function worsened and creatinine went up to 2.70 .  He also has hyponatremia and hyperkalemia.  He was given Lokelma. Acute kidney injury thought to be ATN from COVID-19. Renal ultrasound did not show hydronephrosis. Creatinine slowly improving.  Urine output is picked up.  Lasix remains on hold.  Remains on sodium bicarbonate.  Nephrology is following.  -Creatinine is improving, it is 2.09 today. -Continue with oral bicarb as remains on the lower side at 19 today (actually did drop from yesterday) -Continue to monitor closely as sodium level remains on the lower side.  Continue to hold Lasix.  Acute on chronic systolic CHF/moderate to severe mitral regurgitation EF is noted to be 45 to 50% on echocardiogram done in April 2022.  He was noted to have lower extremity edema.  He was given a low-dose of furosemide.  Had drop in his blood pressure. Echocardiogram was repeated since patient remains dyspneic with concern for fluid overload. Continues to show EF of 45 to 50% with global hypokinesis of the left ventricle.  Right ventricular function was normal.  Moderately elevated pulmonary artery systolic pressures were noted.  Moderate to severe MR was noted.  MR was mild on echocardiogram of April 2022.  Will need to be seen  by cardiology but could be done in the outpatient setting.  He is followed by [facility] cardiology. Blood pressure has stabilized.  Cortisol level was 8.9. Cardiac medications on hold due to low blood pressures.  Prior to admission he was on  amlodipine, furosemide.  Will not reinitiate at this time as we wait for kidneys to recover. Not on beta-blocker due to heart block.  Not on ACE inhibitor or ARB or Entresto due to history of angioedema on ACE inhibitor and ARB.  Acute metabolic encephalopathy Likely due to COVID-19.  Seems to have resolved.  Back to baseline now.  History of coronary artery disease Stable.  Continue Plavix.  Diabetes mellitus type 2, with renal complications, with chronic kidney disease stage IIIa He was noted to have hypoglycemia initially.  Wilder Glade was held.  CBGs have stabilized.  HbA1c 6.2.    History of a flutter, unspecified Followed by cardiology as outpatient.  On Eliquis for stroke prevention.   DVT Prophylaxis: On Eliquis Code Status: Full code Family Communication: No family at bedside.  Discussed with patient Disposition Plan: SNF recommended by physical therapy.  Hopefully discharge when he finishes his COVID isolation.    Status is: Inpatient  Remains inpatient appropriate because: Acute kidney injury, pneumonia due to COVID-19    Medications: Scheduled:  allopurinol  100 mg Oral Daily   apixaban  2.5 mg Oral BID   atorvastatin  80 mg Oral Daily   citalopram  20 mg Oral Daily   clopidogrel  75 mg Oral Q breakfast   dexamethasone (DECADRON) injection  6 mg Intravenous Q24H   ezetimibe  10 mg Oral Daily   insulin aspart  0-9 Units Subcutaneous TID WC   Ipratropium-Albuterol  1 puff Inhalation BID   sodium bicarbonate  650 mg Oral BID   sodium chloride flush  3 mL Intravenous Q12H   Continuous:  sodium chloride     UJW:JXBJYN chloride, acetaminophen, guaiFENesin-dextromethorphan, ondansetron (ZOFRAN) IV, sodium chloride flush  Antibiotics: Anti-infectives (From admission, onward)    Start     Dose/Rate Route Frequency Ordered Stop   04/17/21 1000  remdesivir 100 mg in sodium chloride 0.9 % 100 mL IVPB       See Hyperspace for full Linked Orders Report.   100 mg 200  mL/hr over 30 Minutes Intravenous Daily 04/16/21 1640 04/20/21 1118   04/17/21 1000  cefTRIAXone (ROCEPHIN) 1 g in sodium chloride 0.9 % 100 mL IVPB        1 g 200 mL/hr over 30 Minutes Intravenous Every 24 hours 04/16/21 1734 04/21/21 1219   04/16/21 2000  remdesivir 200 mg in sodium chloride 0.9% 250 mL IVPB       See Hyperspace for full Linked Orders Report.   200 mg 580 mL/hr over 30 Minutes Intravenous Once 04/16/21 1640 04/16/21 2039   04/16/21 1500  doxycycline (VIBRAMYCIN) 100 mg in sodium chloride 0.9 % 250 mL IVPB        100 mg 125 mL/hr over 120 Minutes Intravenous  Once 04/16/21 1448 04/16/21 1912   04/16/21 1445  cefTRIAXone (ROCEPHIN) 1 g in sodium chloride 0.9 % 100 mL IVPB        1 g 200 mL/hr over 30 Minutes Intravenous  Once 04/16/21 1441 04/16/21 1659   04/16/21 1445  azithromycin (ZITHROMAX) 500 mg in sodium chloride 0.9 % 250 mL IVPB  Status:  Discontinued        500 mg 250 mL/hr over 60 Minutes Intravenous  Once 04/16/21 1441  04/16/21 1448       Objective:  Vital Signs  Vitals:   04/24/21 0045 04/24/21 0450 04/24/21 0817 04/24/21 1141  BP: 131/80 120/68 135/82 131/70  Pulse: 71  86 78  Resp: 13  13 17   Temp: 98.6 F (37 C) 98 F (36.7 C) 98.1 F (36.7 C) 97.9 F (36.6 C)  TempSrc: Oral Oral Oral Oral  SpO2: 97%  95% 95%  Weight:   81.5 kg   Height:        Intake/Output Summary (Last 24 hours) at 04/24/2021 1145 Last data filed at 04/23/2021 2217 Gross per 24 hour  Intake 3 ml  Output 750 ml  Net -747 ml   Filed Weights   04/21/21 0457 04/23/21 0445 04/24/21 0817  Weight: 84.5 kg 82.4 kg 81.5 kg    Awake Alert, appropriate, pleasant, frail Symmetrical Chest wall movement, Good air movement bilaterally, CTAB RRR,No Gallops,Rubs or new Murmurs, No Parasternal Heave +ve B.Sounds, Abd Soft, No tenderness, No rebound - guarding or rigidity. No Cyanosis, Clubbing or edema, No new Rash or bruise       Lab Results:  Data Reviewed: I have  personally reviewed following labs and imaging studies  CBC: Recent Labs  Lab 04/19/21 0214 04/23/21 0040  WBC 18.3* 6.8  HGB 12.4* 12.9*  HCT 36.6* 36.4*  MCV 88.0 85.2  PLT 269 315    Basic Metabolic Panel: Recent Labs  Lab 04/19/21 1414 04/20/21 0045 04/21/21 0107 04/22/21 0128 04/23/21 0040 04/24/21 0052  NA  --  130* 131* 132* 134* 132*  K  --  4.6 4.9 5.1 4.6 4.6  CL  --  97* 101 103 105 103  CO2  --  17* 18* 19* 21* 19*  GLUCOSE  --  123* 151* 133* 140* 203*  BUN  --  66* 68* 63* 59* 55*  CREATININE  --  2.70* 2.56* 2.42* 2.23* 2.09*  CALCIUM  --  7.8* 7.5* 7.9* 7.8* 7.9*  PHOS 4.5  --  4.1 4.0 3.7 3.1    GFR: Estimated Creatinine Clearance: 25 mL/min (A) (by C-G formula based on SCr of 2.09 mg/dL (H)).  Liver Function Tests: Recent Labs  Lab 04/21/21 0107 04/22/21 0128 04/23/21 0040 04/24/21 0052  ALBUMIN 2.4* 2.5* 2.3* 2.3*     Coagulation Profile: No results for input(s): INR, PROTIME in the last 168 hours. CBG: Recent Labs  Lab 04/23/21 0745 04/23/21 1202 04/23/21 1609 04/23/21 2039 04/24/21 0826  GLUCAP 99 137* 135* 219* 162*      Recent Results (from the past 240 hour(s))  Blood Culture (routine x 2)     Status: None   Collection Time: 04/16/21  2:17 PM   Specimen: Left Antecubital; Blood  Result Value Ref Range Status   Specimen Description LEFT ANTECUBITAL  Final   Special Requests   Final    BOTTLES DRAWN AEROBIC AND ANAEROBIC Blood Culture results may not be optimal due to an inadequate volume of blood received in culture bottles   Culture   Final    NO GROWTH 5 DAYS Performed at [facility] [address], [place], [place] [address]    Report Status 04/21/2021 FINAL  Final  Resp Panel by RT-PCR (Flu A&B, Covid) Nasopharyngeal Swab     Status: Abnormal   Collection Time: 04/16/21  2:54 PM   Specimen: Nasopharyngeal Swab; Nasopharyngeal(NP) swabs in vial transport medium  Result Value Ref Range Status   SARS  Coronavirus 2 by RT PCR POSITIVE (  A) NEGATIVE Final    Comment: (NOTE) SARS-CoV-2 target nucleic acids are DETECTED.  The SARS-CoV-2 RNA is generally detectable in upper respiratory specimens during the acute phase of infection. Positive results are indicative of the presence of the identified virus, but do not rule out bacterial infection or co-infection with other pathogens not detected by the test. Clinical correlation with patient history and other diagnostic information is necessary to determine patient infection status. The expected result is Negative.  Fact Sheet for Patients: EntrepreneurPulse.com.au  Fact Sheet for Healthcare Providers: IncredibleEmployment.be  This test is not yet approved or cleared by the [place] FDA and  has been authorized for detection and/or diagnosis of SARS-CoV-2 by FDA under an Emergency Use Authorization (EUA).  This EUA will remain in effect (meaning this test can be used) for the duration of  the COVID-19 declaration under Section 564(b)(1) of the A ct, 21 U.S.C. section 360bbb-3(b)(1), unless the authorization is terminated or revoked sooner.     Influenza A by PCR NEGATIVE NEGATIVE Final   Influenza B by PCR NEGATIVE NEGATIVE Final    Comment: (NOTE) The Xpert Xpress SARS-CoV-2/FLU/RSV plus assay is intended as an aid in the diagnosis of influenza from Nasopharyngeal swab specimens and should not be used as a sole basis for treatment. Nasal washings and aspirates are unacceptable for Xpert Xpress SARS-CoV-2/FLU/RSV testing.  Fact Sheet for Patients: EntrepreneurPulse.com.au  Fact Sheet for Healthcare Providers: IncredibleEmployment.be  This test is not yet approved or cleared by the [place] FDA and has been authorized for detection and/or diagnosis of SARS-CoV-2 by FDA under an Emergency Use Authorization (EUA). This EUA will remain in effect (meaning  this test can be used) for the duration of the COVID-19 declaration under Section 564(b)(1) of the Act, 21 U.S.C. section 360bbb-3(b)(1), unless the authorization is terminated or revoked.  Performed at [facility], [employer] [address] [place], [place] [address]        Radiology Studies: No results found.     LOS: 8 days   [facility]  [facility] Pager on [email or web address]  04/24/2021, 11:45 AM  Patient ID: Cope Marte, male   DOB: 08/13/1930, 88 y.o.   MRN: [id number]

## 2021-04-24 NOTE — Progress Notes (Signed)
Physical Therapy Treatment Patient Details Name: Kevin Schwartz MRN: [id number] DOB: July 19, 1930 Today's Date: 04/24/2021   History of Present Illness 88 y/o male admitted 1/3 secondary to weakness and LE swelling. Pt also with covid PNA. RR called 1/5 for low body temp, improving now.  PMHx:  CAD, CHF, CKD, HTN, DM.    PT Comments    Patient seen in co-treatment with OT due to his inability/limited ability to tolerate 2 separate sessions of therapy 04/23/21. Patient completed multiple transfers, progressing from mod assist to minguard assist from elevated BSC with armrests. Completed 2 short bouts of walking (limited initially by dizziness--BP stable and denied lightheadedness). Reports he has problems with dizziness at home and describes it more as a "groggy"/just woke up feeling. Overall, less fatigued than 04/23/21 and believe he can resume separate PT/OT sessions next visit.     Recommendations for follow up therapy are one component of a multi-disciplinary discharge planning process, led by the attending physician.  Recommendations may be updated based on patient status, additional functional criteria and insurance authorization.  Follow Up Recommendations  Skilled nursing-short term rehab (<3 hours/day)     Assistance Recommended at Discharge Frequent or constant Supervision/Assistance  Patient can return home with the following A lot of help with walking and/or transfers;A lot of help with bathing/dressing/bathroom;Assistance with cooking/housework;Assist for transportation;Help with stairs or ramp for entrance   Equipment Recommendations  Wheelchair (measurements PT);Wheelchair cushion (measurements PT)    Recommendations for Other Services       Precautions / Restrictions Precautions Precautions: Fall     Mobility  Bed Mobility Overal bed mobility: Needs Assistance Bed Mobility: Supine to Sit     Supine to sit: HOB elevated;Min assist     General bed mobility  comments: +rail, increased time, cues for sequencing    Transfers Overall transfer level: Needs assistance Equipment used: Rolling walker (2 wheels) Transfers: Sit to/from Stand Sit to Stand: Mod assist;Min guard           General transfer comment: assist to power up and stabilize balance. Increased time.With multiple reps progressed to minguard from elevated BSC with armrests    Ambulation/Gait Ambulation/Gait assistance: Min guard Gait Distance (Feet): 5 Feet (x 2 with seated rest) Assistive device: Rolling walker (2 wheels) Gait Pattern/deviations: Step-to pattern;Decreased stride length;Trunk flexed   Gait velocity interpretation: <1.8 ft/sec, indicate of risk for recurrent falls   General Gait Details: very slow, deliberate steps   Stairs             Wheelchair Mobility    Modified Rankin (Stroke Patients Only)       Balance Overall balance assessment: Needs assistance Sitting-balance support: No upper extremity supported;Feet supported Sitting balance-[name]: Fair     Standing balance support: During functional activity;Reliant on assistive device for balance;Single extremity supported Standing balance-[name]: Poor Standing balance comment: Reliant on UE and external support at sink during ADLs                            Cognition Arousal/Alertness: Awake/alert Behavior During Therapy: Flat affect Overall Cognitive Status: Within Functional Limits for tasks assessed                                 General Comments: [facility]        Exercises      General Comments General comments (skin integrity,  edema, etc.): VSS on monitor on room air      Pertinent Vitals/Pain Pain Assessment: No/denies pain    Home Living                          Prior Function            PT Goals (current goals can now be found in the care plan section) Acute Rehab PT Goals Patient Stated Goal: to go home Time For Goal  Achievement: 05/01/21 Potential to Achieve Goals: Good Progress towards PT goals: Progressing toward goals    Frequency    Min 3X/week      PT Plan Current plan remains appropriate    Co-evaluation PT/OT/SLP Co-Evaluation/Treatment: Yes Reason for Co-Treatment: Other (comment);To address functional/ADL transfers (due to limited endurance) PT goals addressed during session: Mobility/safety with mobility;Balance;Proper use of DME        AM-PAC PT "6 Clicks" Mobility   Outcome Measure  Help needed turning from your back to your side while in a flat bed without using bedrails?: A Little Help needed moving from lying on your back to sitting on the side of a flat bed without using bedrails?: A Lot Help needed moving to and from a bed to a chair (including a wheelchair)?: A Lot Help needed standing up from a chair using your arms (e.g., wheelchair or bedside chair)?: A Lot Help needed to walk in hospital room?: Total Help needed climbing 3-5 steps with a railing? : Total 6 Click Score: 11    End of Session Equipment Utilized During Treatment: Gait belt Activity Tolerance: Patient tolerated treatment well (reports much less fatigued than yesterday when could not tolerate 2 separate sessions) Patient left: in chair;with call bell/phone within reach;Other (comment) (chair alarm pad under him; green box broken--RN aware) Nurse Communication: Mobility status;Other (comment) (no batteries in chair alarm) PT Visit Diagnosis: Muscle weakness (generalized) (M62.81);Other symptoms and signs involving the nervous system (R29.898);Difficulty in walking, not elsewhere classified (R26.2);Other abnormalities of gait and mobility (R26.89)     Time: 5364-6803 PT Time Calculation (min) (ACUTE ONLY): 27 min  Charges:  $Gait Training: 8-22 mins                      Arby Barrette, PT [place]  Pager [phone] Office [phone]    Rexanne Mano 04/24/2021, 9:34 AM

## 2021-04-24 NOTE — Progress Notes (Signed)
Occupational Therapy Treatment Patient Details Name: Kevin Schwartz MRN: [id number] DOB: 05-19-1930 Today's Date: 04/24/2021   History of present illness 88 y/o male admitted 1/3 secondary to weakness and LE swelling. Pt also with covid PNA. RR called 1/5 for low body temp, improving now.  PMHx:  CAD, CHF, CKD, HTN, DM.   OT comments  Patient seen with PT due to unable to participate in both disciplines on previous visit. Patient demonstrated improvement with sit to stand from requiring mod assist and progressed to min guard with verbal cues for technique for hand placement. Patient had complaints of mild dizziness requiring seated breaks during treatment. Patient was able to perform grooming standing at sink and toilet hygiene but required assistance to complete.  Acute OT to continue to follow.    Recommendations for follow up therapy are one component of a multi-disciplinary discharge planning process, led by the attending physician.  Recommendations may be updated based on patient status, additional functional criteria and insurance authorization.    Follow Up Recommendations  Skilled nursing-short term rehab (<3 hours/day)    Assistance Recommended at Discharge Frequent or constant Supervision/Assistance  Patient can return home with the following  A lot of help with walking and/or transfers;A lot of help with bathing/dressing/bathroom   Equipment Recommendations  Other (comment) (TBD)    Recommendations for Other Services      Precautions / Restrictions Precautions Precautions: Fall       Mobility Bed Mobility Overal bed mobility: Needs Assistance Bed Mobility: Supine to Sit     Supine to sit: HOB elevated;Min assist     General bed mobility comments: +rail, increased time, cues for sequencing    Transfers Overall transfer level: Needs assistance Equipment used: Rolling walker (2 wheels) Transfers: Sit to/from Stand Sit to Stand: Mod assist;Min guard            General transfer comment: assist to power up and stabilize balance. Increased time.With multiple reps progressed to minguard from elevated BSC with armrests     Balance Overall balance assessment: Needs assistance Sitting-balance support: No upper extremity supported;Feet supported Sitting balance-[name]: Fair     Standing balance support: During functional activity;Reliant on assistive device for balance;Single extremity supported Standing balance-[name]: Poor Standing balance comment: Reliant on UE and external support at sink during ADLs                           ADL either performed or assessed with clinical judgement   ADL Overall ADL's : Needs assistance/impaired     Grooming: Min guard;Standing;Wash/dry hands;Wash/dry face Grooming Details (indicate cue type and reason): stood at sink for grooming with min guard for balance                 Toilet Transfer: BSC/3in1;Min guard Toilet Transfer Details (indicate cue type and reason): patient performed transfer with BSC placed behind patient after standing at sink Toileting- Water quality scientist and Hygiene: Moderate assistance Toileting - Clothing Manipulation Details (indicate cue type and reason): patient performed toilet hygiene standing with mod assist to complete       General ADL Comments: Patient performed well with being able to stand at sink for grooming and performed toilet hygiene    Extremity/Trunk Assessment              Vision       Perception     Praxis      Cognition Arousal/Alertness: Awake/alert Behavior During  Therapy: Flat affect Overall Cognitive Status: Within Functional Limits for tasks assessed                                 General Comments: [facility]          Exercises     Shoulder Instructions       General Comments VSS on monitor on room air    Pertinent Vitals/ Pain       Pain Assessment: No/denies pain  Home Living                                           Prior Functioning/Environment              Frequency  Min 2X/week        Progress Toward Goals  OT Goals(current goals can now be found in the care plan section)  Progress towards OT goals: Progressing toward goals  Acute Rehab OT Goals Patient Stated Goal: get better OT Goal Formulation: With patient Time For Goal Achievement: 05/02/21 Potential to Achieve Goals: Good ADL Goals Pt Will Perform Grooming: with modified independence;standing Pt Will Perform Upper Body Dressing: with modified independence;sitting Pt Will Perform Lower Body Dressing: with modified independence;sit to/from stand Pt Will Transfer to Toilet: with modified independence;ambulating Pt Will Perform Toileting - Clothing Manipulation and hygiene: with modified independence;sit to/from stand Pt Will Perform Tub/Shower Transfer: Tub transfer;with supervision;rolling walker Pt/caregiver will Perform Home Exercise Program: Increased strength;Both right and left upper extremity;With written HEP provided Additional ADL Goal #1: Patient will tolerate 15 minutes of therapeutic activity without need for rest break indicating improved activity tolerance in prep for ADLs.  Plan Discharge plan remains appropriate    Co-evaluation    PT/OT/SLP Co-Evaluation/Treatment: Yes Reason for Co-Treatment: Other (comment);To address functional/ADL transfers (due to limited endurance) PT goals addressed during session: Mobility/safety with mobility;Balance;Proper use of DME OT goals addressed during session: ADL's and self-care      AM-PAC OT "6 Clicks" Daily Activity     Outcome Measure   Help from another person eating meals?: A Little Help from another person taking care of personal grooming?: A Little Help from another person toileting, which includes using toliet, bedpan, or urinal?: A Lot Help from another person bathing (including washing, rinsing,  drying)?: A Lot Help from another person to put on and taking off regular upper body clothing?: A Little Help from another person to put on and taking off regular lower body clothing?: A Lot 6 Click Score: 15    End of Session Equipment Utilized During Treatment: Gait belt;Rolling walker (2 wheels)  OT Visit Diagnosis: Unsteadiness on feet (R26.81);Muscle weakness (generalized) (M62.81)   Activity Tolerance Patient tolerated treatment well   Patient Left in chair;with call bell/phone within reach;with chair alarm set   Nurse Communication Mobility status        Time: 5277-8242 OT Time Calculation (min): 27 min  Charges: OT General Charges $OT Visit: 1 Visit OT Treatments $Self Care/Home Management : 8-22 mins  Kevin Schwartz, [place]  Pager [phone] Office [place] 04/24/2021, 9:47 AM

## 2021-04-24 NOTE — Progress Notes (Signed)
Pt continue to refuse cpap

## 2021-04-24 NOTE — Progress Notes (Signed)
Brief note- Cr improving, off O2 Appears to be improving.  Follow daily weights and I/O- would continue to hold Lasix for now  Will arrange f/u with our clinic since he states he hasn't seen anyone since Dr Deterding retired.   Nothing further to offer- will sign off. Call with questions.

## 2021-04-24 NOTE — TOC Progression Note (Signed)
Transition of Care [facility]) - Progression Note    Patient Details  Name: Kevin Schwartz MRN: [id number] Date of Birth: October 06, 1930  Transition of Care [facility]) CM/SW [place], LCSW Phone Number: 04/24/2021, 3:18 PM  Clinical Narrative:    CSW spoke with patient's son. He and patient have selected [facility]. CSW contacted [facility] but they are unable to accept patient. CSW made son aware. He discussed with patient and they are requesting [facility]. Ronney Lion will begin insurance authorization with [facility] for Friday.   Expected Discharge Plan: Skilled Nursing Facility Barriers to Discharge: SNF Covid  Expected Discharge Plan and Services Expected Discharge Plan: [place] In-house Referral: Clinical Social Work   Post Acute Care Choice: [place] Living arrangements for the past 2 months: Hotel/Motel                                       Social Determinants of Health (SDOH) Interventions    Readmission Risk Interventions No flowsheet data found.

## 2021-04-25 DIAGNOSIS — U071 COVID-19: Secondary | ICD-10-CM | POA: Diagnosis not present

## 2021-04-25 LAB — GLUCOSE, CAPILLARY
Glucose-Capillary: 137 mg/dL — ABNORMAL HIGH (ref 70–99)
Glucose-Capillary: 119 mg/dL — ABNORMAL HIGH (ref 70–99)
Glucose-Capillary: 165 mg/dL — ABNORMAL HIGH (ref 70–99)
Glucose-Capillary: 182 mg/dL — ABNORMAL HIGH (ref 70–99)

## 2021-04-25 LAB — CBC
HCT: 37.4 % — ABNORMAL LOW (ref 39.0–52.0)
Hemoglobin: 12.8 g/dL — ABNORMAL LOW (ref 13.0–17.0)
MCH: 29.4 pg (ref 26.0–34.0)
MCHC: 34.2 g/dL (ref 30.0–36.0)
MCV: 86 fL (ref 80.0–100.0)
Platelets: 424 10*3/uL — ABNORMAL HIGH (ref 150–400)
RBC: 4.35 MIL/uL (ref 4.22–5.81)
RDW: 17.3 % — ABNORMAL HIGH (ref 11.5–15.5)
WBC: 7 10*3/uL (ref 4.0–10.5)
nRBC: 0 % (ref 0.0–0.2)

## 2021-04-25 LAB — RENAL FUNCTION PANEL
Albumin: 2.2 g/dL — ABNORMAL LOW (ref 3.5–5.0)
Anion gap: 9 (ref 5–15)
BUN: 52 mg/dL — ABNORMAL HIGH (ref 8–23)
CO2: 21 mmol/L — ABNORMAL LOW (ref 22–32)
Calcium: 7.9 mg/dL — ABNORMAL LOW (ref 8.9–10.3)
Chloride: 102 mmol/L (ref 98–111)
Creatinine, Ser: 1.83 mg/dL — ABNORMAL HIGH (ref 0.61–1.24)
GFR, Estimated: 35 mL/min — ABNORMAL LOW (ref >60)
Glucose, Bld: 180 mg/dL — ABNORMAL HIGH (ref 70–99)
Phosphorus: 2.6 mg/dL (ref 2.5–4.6)
Potassium: 4.3 mmol/L (ref 3.5–5.1)
Sodium: 132 mmol/L — ABNORMAL LOW (ref 135–145)

## 2021-04-25 NOTE — Progress Notes (Signed)
[facility] PROGRESS NOTE   Kevin Schwartz WIO:[id number] DOB: August 02, 1930 DOA: 04/16/2021  PCP: [name], MD  Brief History/Interval Summary:  [age] y.o. male with medical history significant of CAD with stenting on Plavix and Eliquis regimen, chronic systolic CHF LVEF 42-68%, CKD stage IIIa, HTN, HLD, BPH, IIDM, presented with increasing shortness of breath and cough and leg swelling.  Patient tested positive for COVID-19.  Imaging studies showed left upper lobe consolidation and bilateral groundglass opacities.  Patient was hospitalized for further management.  Given Remdesivir and steroids.  Subsequently developed acute kidney injury.  Nephrology was consulted.  Seems to be gradually improving.  Will need to go to SNF when medically ready for discharge.  Reason for Visit: Pneumonia due to COVID-19.  Acute systolic CHF.  Acute kidney injury  Consultants: Nephrology  Procedures: None    Subjective/Interval History:  Patient denies any chest pain, any shortness of breath, he denies any complaints.   Assessment/Plan:  Pneumonia due to COVID-19/sepsis, present on admission Patient had sepsis criteria on admission.  Noted to be tachypneic, tachycardic with elevated WBC.  He also had elevated lactic acid level. It is felt that his presentation was primarily due to COVID-19 with some concern for secondary bacterial infection.  Procalcitonin was 0.8. Had mild hemoptysis which could be from pneumonia.  Patient does not have any symptoms or signs otherwise suggestive of thromboembolic process.  He is already on anticoagulation for a flutter. However his D-dimer was noted to be elevated which could from COVID-19.  A lower extremity Doppler study was negative for DVT.   CRP was elevated at 12.7 and then improved to 5.7.  Leukocytosis was due to steroids and has improved. Patient has completed 5 days of Remdesivir.  He has also completed 5 days of ceftriaxone.   Continue  with dexamethasone to complete a 10-day course. Patient' patient's respiratory status is stable.  He seems to be back to baseline.  Saturating normal on room air.  Acute respiratory failure with hypoxia Initially due to pneumonia from COVID-19 and subsequently from fluid overload.  Now back to baseline.  Off of oxygen. - resolved, he is currently on room air.  Acute on chronic kidney disease stage IIIa/hyponatremia/hyperkalemia Baseline creatinine seems to be around 1.3-1.6.   Renal function worsened and creatinine went up to 2.70 .  He also has hyponatremia and hyperkalemia.  He was given Lokelma. Acute kidney injury thought to be ATN from COVID-19. Renal ultrasound did not show hydronephrosis. Creatinine slowly improving.  Urine output is picked up.  Lasix remains on hold.  Remains on sodium bicarbonate. -Creatinine is improving, it is 1.8 today, renal input greatly appreciated, renal has signed off, they will arrange for outpatient follow-up.  -Continue with oral bicarb -Continue to hold Lasix.  Acute on chronic systolic CHF/moderate to severe mitral regurgitation EF is noted to be 45 to 50% on echocardiogram done in April 2022.  He was noted to have lower extremity edema.  He was given a low-dose of furosemide.  Had drop in his blood pressure. Echocardiogram was repeated since patient remains dyspneic with concern for fluid overload. Continues to show EF of 45 to 50% with global hypokinesis of the left ventricle.  Right ventricular function was normal.  Moderately elevated pulmonary artery systolic pressures were noted.  Moderate to severe MR was noted.  MR was mild on echocardiogram of April 2022.  Will need to be seen by cardiology but could be done in the outpatient  setting.  He is followed by [facility] cardiology. Blood pressure has stabilized.  Cortisol level was 8.9. Cardiac medications on hold due to low blood pressures.  Prior to admission he was on amlodipine, furosemide.  Will not  reinitiate at this time as we wait for kidneys to recover. Not on beta-blocker due to heart block.  Not on ACE inhibitor or ARB or Entresto due to history of angioedema on ACE inhibitor and ARB.  Acute metabolic encephalopathy Likely due to COVID-19.  Seems to have resolved.  Back to baseline now.  History of coronary artery disease Stable.  Continue Plavix.  Diabetes mellitus type 2, with renal complications, with chronic kidney disease stage IIIa He was noted to have hypoglycemia initially.  [place] was held.  CBGs have stabilized.  HbA1c 6.2.    History of a flutter, unspecified Followed by cardiology as outpatient.  On Eliquis for stroke prevention.   DVT Prophylaxis: On Eliquis Code Status: Full code Family Communication: No family at bedside.  Discussed with patient Disposition Plan: SNF recommended by physical therapy.  Hopefully discharge when he finishes his COVID isolation.    Status is: Inpatient  Remains inpatient appropriate because: Acute kidney injury, pneumonia due to COVID-19    Medications: Scheduled:  allopurinol  100 mg Oral Daily   apixaban  2.5 mg Oral BID   atorvastatin  80 mg Oral Daily   citalopram  20 mg Oral Daily   clopidogrel  75 mg Oral Q breakfast   dexamethasone (DECADRON) injection  6 mg Intravenous Q24H   ezetimibe  10 mg Oral Daily   insulin aspart  0-9 Units Subcutaneous TID WC   Ipratropium-Albuterol  1 puff Inhalation BID   sodium bicarbonate  650 mg Oral BID   sodium chloride flush  3 mL Intravenous Q12H   Continuous:  sodium chloride     ZOX:WRUEAV chloride, acetaminophen, guaiFENesin-dextromethorphan, ondansetron (ZOFRAN) IV, sodium chloride flush  Antibiotics: Anti-infectives (From admission, onward)    Start     Dose/Rate Route Frequency Ordered Stop   04/17/21 1000  remdesivir 100 mg in sodium chloride 0.9 % 100 mL IVPB       See Hyperspace for full Linked Orders Report.   100 mg 200 mL/hr over 30 Minutes Intravenous  Daily 04/16/21 1640 04/20/21 1118   04/17/21 1000  cefTRIAXone (ROCEPHIN) 1 g in sodium chloride 0.9 % 100 mL IVPB        1 g 200 mL/hr over 30 Minutes Intravenous Every 24 hours 04/16/21 1734 04/21/21 1219   04/16/21 2000  remdesivir 200 mg in sodium chloride 0.9% 250 mL IVPB       See Hyperspace for full Linked Orders Report.   200 mg 580 mL/hr over 30 Minutes Intravenous Once 04/16/21 1640 04/16/21 2039   04/16/21 1500  doxycycline (VIBRAMYCIN) 100 mg in sodium chloride 0.9 % 250 mL IVPB        100 mg 125 mL/hr over 120 Minutes Intravenous  Once 04/16/21 1448 04/16/21 1912   04/16/21 1445  cefTRIAXone (ROCEPHIN) 1 g in sodium chloride 0.9 % 100 mL IVPB        1 g 200 mL/hr over 30 Minutes Intravenous  Once 04/16/21 1441 04/16/21 1659   04/16/21 1445  azithromycin (ZITHROMAX) 500 mg in sodium chloride 0.9 % 250 mL IVPB  Status:  Discontinued        500 mg 250 mL/hr over 60 Minutes Intravenous  Once 04/16/21 1441 04/16/21 1448       Objective:  Vital Signs  Vitals:   04/25/21 0400 04/25/21 0452 04/25/21 0749 04/25/21 1052  BP: 116/62  119/72 137/65  Pulse: 67  78 68  Resp: 13  17 16   Temp: 98.7 F (37.1 C)  98.5 F (36.9 C) 98.3 F (36.8 C)  TempSrc: Axillary  Oral Oral  SpO2: 97%  98% 97%  Weight:  81.5 kg    Height:        Intake/Output Summary (Last 24 hours) at 04/25/2021 1251 Last data filed at 04/25/2021 0656 Gross per 24 hour  Intake --  Output 900 ml  Net -900 ml   Filed Weights   04/23/21 0445 04/24/21 0817 04/25/21 0452  Weight: 82.4 kg 81.5 kg 81.5 kg    Awake Alert, appropriate, frail Symmetrical Chest wall movement, Good air movement bilaterally, CTAB RRR,No Gallops,Rubs or new Murmurs, No Parasternal Heave +ve B.Sounds, Abd Soft, No tenderness, No rebound - guarding or rigidity. No Cyanosis, Clubbing or edema, No new Rash or bruise        Lab Results:  Data Reviewed: I have personally reviewed following labs and imaging  studies  CBC: Recent Labs  Lab 04/19/21 0214 04/23/21 0040 04/25/21 0103  WBC 18.3* 6.8 7.0  HGB 12.4* 12.9* 12.8*  HCT 36.6* 36.4* 37.4*  MCV 88.0 85.2 86.0  PLT 269 383 424*    Basic Metabolic Panel: Recent Labs  Lab 04/21/21 0107 04/22/21 0128 04/23/21 0040 04/24/21 0052 04/25/21 0103  NA 131* 132* 134* 132* 132*  K 4.9 5.1 4.6 4.6 4.3  CL 101 103 105 103 102  CO2 18* 19* 21* 19* 21*  GLUCOSE 151* 133* 140* 203* 180*  BUN 68* 63* 59* 55* 52*  CREATININE 2.56* 2.42* 2.23* 2.09* 1.83*  CALCIUM 7.5* 7.9* 7.8* 7.9* 7.9*  PHOS 4.1 4.0 3.7 3.1 2.6    GFR: Estimated Creatinine Clearance: 28.5 mL/min (A) (by C-G formula based on SCr of 1.83 mg/dL (H)).  Liver Function Tests: Recent Labs  Lab 04/21/21 0107 04/22/21 0128 04/23/21 0040 04/24/21 0052 04/25/21 0103  ALBUMIN 2.4* 2.5* 2.3* 2.3* 2.2*     Coagulation Profile: No results for input(s): INR, PROTIME in the last 168 hours. CBG: Recent Labs  Lab 04/24/21 1144 04/24/21 1530 04/24/21 2048 04/25/21 0756 04/25/21 1055  GLUCAP 141* 118* 137* 137* 119*      Recent Results (from the past 240 hour(s))  Blood Culture (routine x 2)     Status: None   Collection Time: 04/16/21  2:17 PM   Specimen: Left Antecubital; Blood  Result Value Ref Range Status   Specimen Description LEFT ANTECUBITAL  Final   Special Requests   Final    BOTTLES DRAWN AEROBIC AND ANAEROBIC Blood Culture results may not be optimal due to an inadequate volume of blood received in culture bottles   Culture   Final    NO GROWTH 5 DAYS Performed at [facility], [place] [address] [place], [place] [address]    Report Status 04/21/2021 FINAL  Final  Resp Panel by RT-PCR (Flu A&B, Covid) Nasopharyngeal Swab     Status: Abnormal   Collection Time: 04/16/21  2:54 PM   Specimen: Nasopharyngeal Swab; Nasopharyngeal(NP) swabs in vial transport medium  Result Value Ref Range Status   SARS Coronavirus 2 by RT PCR POSITIVE (A) NEGATIVE  Final    Comment: (NOTE) SARS-CoV-2 target nucleic acids are DETECTED.  The SARS-CoV-2 RNA is generally detectable in upper respiratory specimens during the acute phase of infection. Positive results  are indicative of the presence of the identified virus, but do not rule out bacterial infection or co-infection with other pathogens not detected by the test. Clinical correlation with patient history and other diagnostic information is necessary to determine patient infection status. The expected result is Negative.  Fact Sheet for Patients: EntrepreneurPulse.com.au  Fact Sheet for Healthcare Providers: IncredibleEmployment.be  This test is not yet approved or cleared by the [place] FDA and  has been authorized for detection and/or diagnosis of SARS-CoV-2 by FDA under an Emergency Use Authorization (EUA).  This EUA will remain in effect (meaning this test can be used) for the duration of  the COVID-19 declaration under Section 564(b)(1) of the A ct, 21 U.S.C. section 360bbb-3(b)(1), unless the authorization is terminated or revoked sooner.     Influenza A by PCR NEGATIVE NEGATIVE Final   Influenza B by PCR NEGATIVE NEGATIVE Final    Comment: (NOTE) The Xpert Xpress SARS-CoV-2/FLU/RSV plus assay is intended as an aid in the diagnosis of influenza from Nasopharyngeal swab specimens and should not be used as a sole basis for treatment. Nasal washings and aspirates are unacceptable for Xpert Xpress SARS-CoV-2/FLU/RSV testing.  Fact Sheet for Patients: EntrepreneurPulse.com.au  Fact Sheet for Healthcare Providers: IncredibleEmployment.be  This test is not yet approved or cleared by the [place] FDA and has been authorized for detection and/or diagnosis of SARS-CoV-2 by FDA under an Emergency Use Authorization (EUA). This EUA will remain in effect (meaning this test can be used) for the duration of  the COVID-19 declaration under Section 564(b)(1) of the Act, 21 U.S.C. section 360bbb-3(b)(1), unless the authorization is terminated or revoked.  Performed at [facility], [place] [address], [place], [place] [address]        Radiology Studies: No results found.     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   MRN: [id number] Patient ID: Kevin Schwartz, male   DOB: [birth year], 88 y.o.   MRN: [id number]

## 2021-04-26 DIAGNOSIS — J189 Pneumonia, unspecified organism: Secondary | ICD-10-CM | POA: Diagnosis not present

## 2021-04-26 DIAGNOSIS — I5021 Acute systolic (congestive) heart failure: Secondary | ICD-10-CM | POA: Diagnosis not present

## 2021-04-26 LAB — GLUCOSE, CAPILLARY
Glucose-Capillary: 136 mg/dL — ABNORMAL HIGH (ref 70–99)
Glucose-Capillary: 116 mg/dL — ABNORMAL HIGH (ref 70–99)
Glucose-Capillary: 138 mg/dL — ABNORMAL HIGH (ref 70–99)
Glucose-Capillary: 127 mg/dL — ABNORMAL HIGH (ref 70–99)

## 2021-04-26 NOTE — TOC Progression Note (Signed)
Transition of Care [facility]) - Progression Note    Patient Details  Name: Kevin Schwartz MRN: [id number] Date of Birth: [birth year]  Transition of Care [facility]) CM/SW [place], LCSW Phone Number: 04/26/2021, 3:24 PM  Clinical Narrative:    [facility] Lion still waiting on insurance approval. Will continue to follow; they are able to accept him on the weekend if authorization returns.    Expected Discharge Plan: Skilled Nursing Facility Barriers to Discharge: SNF Covid  Expected Discharge Plan and Services Expected Discharge Plan: [place] In-house Referral: Clinical Social Work   Post Acute Care Choice: [place] Living arrangements for the past 2 months: Hotel/Motel                                       Social Determinants of Health (SDOH) Interventions    Readmission Risk Interventions No flowsheet data found.

## 2021-04-26 NOTE — Progress Notes (Signed)
[facility] PROGRESS NOTE   Kevin Schwartz WHQ:[id number] DOB: 05/26/1930 DOA: 04/16/2021  PCP: [name], MD  Brief History/Interval Summary:  [age] y.o. male with medical history significant of CAD with stenting on Plavix and Eliquis regimen, chronic systolic CHF LVEF 65-99%, CKD stage IIIa, HTN, HLD, BPH, IIDM, presented with increasing shortness of breath and cough and leg swelling.  Patient tested positive for COVID-19.  Imaging studies showed left upper lobe consolidation and bilateral groundglass opacities.  Patient was hospitalized for further management.  Given Remdesivir and steroids.  Subsequently developed acute kidney injury.  Nephrology was consulted.  Seems to be gradually improving.  Will need to go to SNF when medically ready for discharge.  Reason for Visit: Pneumonia due to COVID-19.  Acute systolic CHF.  Acute kidney injury  Consultants: Nephrology  Procedures: None    Subjective/Interval History:  No significant events overnight, patient denies any complaints.  Assessment/Plan:  Pneumonia due to COVID-19/sepsis, present on admission Patient had sepsis criteria on admission.  Noted to be tachypneic, tachycardic with elevated WBC.  He also had elevated lactic acid level. It is felt that his presentation was primarily due to COVID-19 with some concern for secondary bacterial infection.  Procalcitonin was 0.8. Had mild hemoptysis which could be from pneumonia.  Patient does not have any symptoms or signs otherwise suggestive of thromboembolic process.  He is already on anticoagulation for a flutter. However his D-dimer was noted to be elevated which could from COVID-19.  A lower extremity Doppler study was negative for DVT.   CRP was elevated at 12.7 and then improved to 5.7.  Leukocytosis was due to steroids and has improved. Patient has completed 5 days of Remdesivir.  He has also completed 5 days of ceftriaxone.   Continue with dexamethasone to  complete a 10-day course. Patient' patient's respiratory status is stable.  He seems to be back to baseline.  Saturating normal on room air.  Acute respiratory failure with hypoxia Initially due to pneumonia from COVID-19 and subsequently from fluid overload.  Now back to baseline.  Off of oxygen. - resolved, he is currently on room air.  Acute on chronic kidney disease stage IIIa/hyponatremia/hyperkalemia Baseline creatinine seems to be around 1.3-1.6.   Renal function worsened and creatinine went up to 2.70 .  He also has hyponatremia and hyperkalemia.  He was given Lokelma. Acute kidney injury thought to be ATN from COVID-19. Renal ultrasound did not show hydronephrosis. Creatinine slowly improving.  Urine output is picked up.  Lasix remains on hold.  Remains on sodium bicarbonate. -Creatinine is improving, it is 1.8 today, renal input greatly appreciated, renal has signed off, they will arrange for outpatient follow-up.  -Continue with oral bicarb -Continue to hold Lasix.  Acute on chronic systolic CHF/moderate to severe mitral regurgitation EF is noted to be 45 to 50% on echocardiogram done in April 2022.  He was noted to have lower extremity edema.  He was given a low-dose of furosemide.  Had drop in his blood pressure. Echocardiogram was repeated since patient remains dyspneic with concern for fluid overload. Continues to show EF of 45 to 50% with global hypokinesis of the left ventricle.  Right ventricular function was normal.  Moderately elevated pulmonary artery systolic pressures were noted.  Moderate to severe MR was noted.  MR was mild on echocardiogram of April 2022.  Will need to be seen by cardiology but could be done in the outpatient setting.  He is followed by  [facility] cardiology. Blood pressure has stabilized.  Cortisol level was 8.9. Cardiac medications on hold due to low blood pressures.  Prior to admission he was on amlodipine, furosemide.  Will not reinitiate at this time as  we wait for kidneys to recover. Not on beta-blocker due to heart block.  Not on ACE inhibitor or ARB or Entresto due to history of angioedema on ACE inhibitor and ARB.  Acute metabolic encephalopathy Likely due to COVID-19.  Seems to have resolved.  Back to baseline now.  History of coronary artery disease Stable.  Continue Plavix.  Diabetes mellitus type 2, with renal complications, with chronic kidney disease stage IIIa He was noted to have hypoglycemia initially.  Wilder Glade was held.  CBGs have stabilized.  HbA1c 6.2.    History of a flutter, unspecified Followed by cardiology as outpatient.  On Eliquis for stroke prevention.   DVT Prophylaxis: On Eliquis Code Status: Full code Family Communication: No family at bedside.  Discussed with patient Disposition Plan: SNF recommended by physical therapy.  Hopefully discharge when he finishes his COVID isolation.   Patient is medically ready for discharge, awaiting SNF bed availability/insurance authorization, he did finish COVID-19 isolation.. Status is: Inpatient     Medications: Scheduled:  allopurinol  100 mg Oral Daily   apixaban  2.5 mg Oral BID   atorvastatin  80 mg Oral Daily   citalopram  20 mg Oral Daily   clopidogrel  75 mg Oral Q breakfast   ezetimibe  10 mg Oral Daily   insulin aspart  0-9 Units Subcutaneous TID WC   Ipratropium-Albuterol  1 puff Inhalation BID   sodium bicarbonate  650 mg Oral BID   sodium chloride flush  3 mL Intravenous Q12H   Continuous:  sodium chloride     QVZ:DGLOVF chloride, acetaminophen, guaiFENesin-dextromethorphan, ondansetron (ZOFRAN) IV, sodium chloride flush  Antibiotics: Anti-infectives (From admission, onward)    Start     Dose/Rate Route Frequency Ordered Stop   04/17/21 1000  remdesivir 100 mg in sodium chloride 0.9 % 100 mL IVPB       See Hyperspace for full Linked Orders Report.   100 mg 200 mL/hr over 30 Minutes Intravenous Daily 04/16/21 1640 04/20/21 1118   04/17/21  1000  cefTRIAXone (ROCEPHIN) 1 g in sodium chloride 0.9 % 100 mL IVPB        1 g 200 mL/hr over 30 Minutes Intravenous Every 24 hours 04/16/21 1734 04/21/21 1219   04/16/21 2000  remdesivir 200 mg in sodium chloride 0.9% 250 mL IVPB       See Hyperspace for full Linked Orders Report.   200 mg 580 mL/hr over 30 Minutes Intravenous Once 04/16/21 1640 04/16/21 2039   04/16/21 1500  doxycycline (VIBRAMYCIN) 100 mg in sodium chloride 0.9 % 250 mL IVPB        100 mg 125 mL/hr over 120 Minutes Intravenous  Once 04/16/21 1448 04/16/21 1912   04/16/21 1445  cefTRIAXone (ROCEPHIN) 1 g in sodium chloride 0.9 % 100 mL IVPB        1 g 200 mL/hr over 30 Minutes Intravenous  Once 04/16/21 1441 04/16/21 1659   04/16/21 1445  azithromycin (ZITHROMAX) 500 mg in sodium chloride 0.9 % 250 mL IVPB  Status:  Discontinued        500 mg 250 mL/hr over 60 Minutes Intravenous  Once 04/16/21 1441 04/16/21 1448       Objective:  Vital Signs  Vitals:   04/26/21 0009 04/26/21 0400 04/26/21  0500 04/26/21 0753  BP: (!) 132/94 135/75  113/63  Pulse: 87 79  88  Resp: 18 20  15   Temp: 97.9 F (36.6 C) 98 F (36.7 C)  (!) 97.5 F (36.4 C)  TempSrc: Axillary Oral  Oral  SpO2: 96% 97%  100%  Weight:   83.2 kg   Height:        Intake/Output Summary (Last 24 hours) at 04/26/2021 1153 Last data filed at 04/26/2021 1018 Gross per 24 hour  Intake --  Output 900 ml  Net -900 ml   Filed Weights   04/24/21 0817 04/25/21 0452 04/26/21 0500  Weight: 81.5 kg 81.5 kg 83.2 kg    Awake Alert, appropriate, frail, no apparent distress Symmetrical Chest wall movement, Good air movement bilaterally, CTAB RRR,No Gallops,Rubs or new Murmurs, No Parasternal Heave +ve B.Sounds, Abd Soft, No tenderness, No rebound - guarding or rigidity. No Cyanosis, Clubbing or edema, No new Rash or bruise         Lab Results:  Data Reviewed: I have personally reviewed following labs and imaging studies  CBC: Recent Labs  Lab  04/23/21 0040 04/25/21 0103  WBC 6.8 7.0  HGB 12.9* 12.8*  HCT 36.4* 37.4*  MCV 85.2 86.0  PLT 383 424*    Basic Metabolic Panel: Recent Labs  Lab 04/21/21 0107 04/22/21 0128 04/23/21 0040 04/24/21 0052 04/25/21 0103  NA 131* 132* 134* 132* 132*  K 4.9 5.1 4.6 4.6 4.3  CL 101 103 105 103 102  CO2 18* 19* 21* 19* 21*  GLUCOSE 151* 133* 140* 203* 180*  BUN 68* 63* 59* 55* 52*  CREATININE 2.56* 2.42* 2.23* 2.09* 1.83*  CALCIUM 7.5* 7.9* 7.8* 7.9* 7.9*  PHOS 4.1 4.0 3.7 3.1 2.6    GFR: Estimated Creatinine Clearance: 28.8 mL/min (A) (by C-G formula based on SCr of 1.83 mg/dL (H)).  Liver Function Tests: Recent Labs  Lab 04/21/21 0107 04/22/21 0128 04/23/21 0040 04/24/21 0052 04/25/21 0103  ALBUMIN 2.4* 2.5* 2.3* 2.3* 2.2*     Coagulation Profile: No results for input(s): INR, PROTIME in the last 168 hours. CBG: Recent Labs  Lab 04/25/21 0756 04/25/21 1055 04/25/21 1653 04/25/21 2126 04/26/21 0758  GLUCAP 137* 119* 165* 182* 136*      Recent Results (from the past 240 hour(s))  Blood Culture (routine x 2)     Status: None   Collection Time: 04/16/21  2:17 PM   Specimen: Left Antecubital; Blood  Result Value Ref Range Status   Specimen Description LEFT ANTECUBITAL  Final   Special Requests   Final    BOTTLES DRAWN AEROBIC AND ANAEROBIC Blood Culture results may not be optimal due to an inadequate volume of blood received in culture bottles   Culture   Final    NO GROWTH 5 DAYS Performed at [facility], [place] [address] [place], [place] [address]    Report Status 04/21/2021 FINAL  Final  Resp Panel by RT-PCR (Flu A&B, Covid) Nasopharyngeal Swab     Status: Abnormal   Collection Time: 04/16/21  2:54 PM   Specimen: Nasopharyngeal Swab; Nasopharyngeal(NP) swabs in vial transport medium  Result Value Ref Range Status   SARS Coronavirus 2 by RT PCR POSITIVE (A) NEGATIVE Final    Comment: (NOTE) SARS-CoV-2 target nucleic acids are  DETECTED.  The SARS-CoV-2 RNA is generally detectable in upper respiratory specimens during the acute phase of infection. Positive results are indicative of the presence of the identified virus, but do not  rule out bacterial infection or co-infection with other pathogens not detected by the test. Clinical correlation with patient history and other diagnostic information is necessary to determine patient infection status. The expected result is Negative.  Fact Sheet for Patients: EntrepreneurPulse.com.au  Fact Sheet for Healthcare Providers: IncredibleEmployment.be  This test is not yet approved or cleared by the [place] FDA and  has been authorized for detection and/or diagnosis of SARS-CoV-2 by FDA under an Emergency Use Authorization (EUA).  This EUA will remain in effect (meaning this test can be used) for the duration of  the COVID-19 declaration under Section 564(b)(1) of the A ct, 21 U.S.C. section 360bbb-3(b)(1), unless the authorization is terminated or revoked sooner.     Influenza A by PCR NEGATIVE NEGATIVE Final   Influenza B by PCR NEGATIVE NEGATIVE Final    Comment: (NOTE) The Xpert Xpress SARS-CoV-2/FLU/RSV plus assay is intended as an aid in the diagnosis of influenza from Nasopharyngeal swab specimens and should not be used as a sole basis for treatment. Nasal washings and aspirates are unacceptable for Xpert Xpress SARS-CoV-2/FLU/RSV testing.  Fact Sheet for Patients: EntrepreneurPulse.com.au  Fact Sheet for Healthcare Providers: IncredibleEmployment.be  This test is not yet approved or cleared by the [place] FDA and has been authorized for detection and/or diagnosis of SARS-CoV-2 by FDA under an Emergency Use Authorization (EUA). This EUA will remain in effect (meaning this test can be used) for the duration of the COVID-19 declaration under Section 564(b)(1) of the Act, 21  U.S.C. section 360bbb-3(b)(1), unless the authorization is terminated or revoked.  Performed at [facility], [place] [address], [place], [place] [address]        Radiology Studies: No results found.     LOS: 10 days   [facility]  [facility] Pager on [email or web address]  04/26/2021, 11:53 AM  Patient ID: Kevin Schwartz, male   DOB: 12/09/1930, 88 y.o.   MRN: [id number] Patient ID: Kevin Schwartz, male   DOB: 1931-04-08, 88 y.o.   MRN: [id number] Patient ID: Kevin Schwartz, male   DOB: 1930/10/21, 88 y.o.   MRN: [id number]

## 2021-04-26 NOTE — Progress Notes (Signed)
Occupational Therapy Treatment Patient Details Name: Kevin Schwartz MRN: [id number] DOB: October 28, 1930 Today's Date: 04/26/2021   History of present illness 88 y/o male admitted 1/3 secondary to weakness and LE swelling. Pt also with covid PNA. RR called 1/5 for low body temp, improving now.  PMHx:  CAD, CHF, CKD, HTN, DM.   OT comments  Pt. Seen for bed level B UE exercises as part of HEP for continued strengthening.  Pt. With initial fluctuating o2 from 92% to mid 80s. Heavy cues for PLB strategies.  Pt. Able to return demo and maintain o2 levels for remainder of session.  Pt. Able to complete b ue exercises with utilization of bed rails for pulling and pushing.  Good awareness of need and initiation of rest breaks.  Pt. Is very motivated and engaged well throughout session.  Current d/c recommendations remain appropriate.     Recommendations for follow up therapy are one component of a multi-disciplinary discharge planning process, led by the attending physician.  Recommendations may be updated based on patient status, additional functional criteria and insurance authorization.    Follow Up Recommendations  Skilled nursing-short term rehab (<3 hours/day)    Assistance Recommended at Discharge Frequent or constant Supervision/Assistance  Patient can return home with the following  A lot of help with walking and/or transfers;A lot of help with bathing/dressing/bathroom   Equipment Recommendations       Recommendations for Other Services      Precautions / Restrictions Precautions Precautions: Fall Restrictions Weight Bearing Restrictions: No       Mobility Bed Mobility                    Transfers                         Balance                                           ADL either performed or assessed with clinical judgement   ADL                                              Extremity/Trunk Assessment               Vision       Perception     Praxis      Cognition Arousal/Alertness: Awake/alert Behavior During Therapy: WFL for tasks assessed/performed Overall Cognitive Status: Within Functional Limits for tasks assessed                                            Exercises Other Exercises Other Exercises: pt. able to demonstrate b ue bed level exercises utilizing bed rails and non resistance b ue exercises with focus on chest, and biceps.  uses bed rails for pulling in prep for also continuing with increasing safety and independence with bed mobility.  good initiation of rest breaks. required heavy cueing and demo for plb strategies but able to return demo once instructed   Shoulder Instructions       General Comments      Pertinent Vitals/ Pain  Pain Assessment: No/denies pain  Home Living                                          Prior Functioning/Environment              Frequency  Min 2X/week        Progress Toward Goals  OT Goals(current goals can now be found in the care plan section)  Progress towards OT goals: Progressing toward goals     Plan Discharge plan remains appropriate    Co-evaluation                 AM-PAC OT "6 Clicks" Daily Activity     Outcome Measure   Help from another person eating meals?: A Little Help from another person taking care of personal grooming?: A Little Help from another person toileting, which includes using toliet, bedpan, or urinal?: A Lot Help from another person bathing (including washing, rinsing, drying)?: A Lot Help from another person to put on and taking off regular upper body clothing?: A Little Help from another person to put on and taking off regular lower body clothing?: A Lot 6 Click Score: 15    End of Session    OT Visit Diagnosis: Unsteadiness on feet (R26.81);Muscle weakness (generalized) (M62.81)   Activity Tolerance Patient tolerated treatment  well   Patient Left with call bell/phone within reach;in bed   Nurse Communication          Time: 3888-2800 OT Time Calculation (min): 16 min  Charges: OT General Charges $OT Visit: 1 Visit OT Treatments $Therapeutic Exercise: 8-22 mins  Sonia Baller, COTA/L Acute Rehabilitation [phone]   Tanya Nones 04/26/2021, 12:53 PM

## 2021-04-26 NOTE — Progress Notes (Signed)
Physical Therapy Treatment Patient Details Name: Kevin Schwartz MRN: [id number] DOB: 1930/09/25 Today's Date: 04/26/2021   History of Present Illness 88 y/o male admitted 1/3 secondary to weakness and LE swelling. Pt also with covid PNA. RR called 1/5 for low body temp, improving now.  PMHx:  CAD, CHF, CKD, HTN, DM.    PT Comments    Pt limited by fatigue at this time, only able to tolerate very short distances of ambulation. Pt with significant LE weakness resulting in an inability to stand from lower surfaces. Pt will benefit from continued acute PT services to improve LE strength and restore independence. Pt remains at a high risk for falls, acute PT continues to recommend SNF placement.   Recommendations for follow up therapy are one component of a multi-disciplinary discharge planning process, led by the attending physician.  Recommendations may be updated based on patient status, additional functional criteria and insurance authorization.  Follow Up Recommendations  Skilled nursing-short term rehab (<3 hours/day)     Assistance Recommended at Discharge Frequent or constant Supervision/Assistance  Patient can return home with the following A lot of help with walking and/or transfers;A lot of help with bathing/dressing/bathroom;Assistance with cooking/housework;Assist for transportation;Help with stairs or ramp for entrance   Equipment Recommendations  Wheelchair (measurements PT);Wheelchair cushion (measurements PT)    Recommendations for Other Services       Precautions / Restrictions Precautions Precautions: Fall Restrictions Weight Bearing Restrictions: No     Mobility  Bed Mobility Overal bed mobility: Needs Assistance Bed Mobility: Supine to Sit;Sit to Supine     Supine to sit: Min assist;HOB elevated Sit to supine: Min assist;HOB elevated   General bed mobility comments: pt unable to scoot at edge of bed to get both feet on floor    Transfers Overall  transfer level: Needs assistance Equipment used: Rolling walker (2 wheels) Transfers: Sit to/from Stand;Bed to chair/wheelchair/BSC Sit to Stand: Min assist;Min guard;From elevated surface Stand pivot transfers: Min assist         General transfer comment: pt unable to stand from bed in low position, requires elevation of bed 6+inches along with verbal cues for hand placement, trunk flexion, forward lean    Ambulation/Gait Ambulation/Gait assistance: Min guard Gait Distance (Feet): 5 Feet (5' x 4 trials) Assistive device: Rolling walker (2 wheels) Gait Pattern/deviations: Step-to pattern Gait velocity: reduced Gait velocity interpretation: <1.31 ft/sec, indicative of household ambulator   General Gait Details: pt with slowed step-to gait, reduced step length bilaterally   Stairs             Wheelchair Mobility    Modified Rankin (Stroke Patients Only)       Balance Overall balance assessment: Needs assistance Sitting-balance support: No upper extremity supported;Feet supported Sitting balance-[name]: Good     Standing balance support: Bilateral upper extremity supported;Reliant on assistive device for balance Standing balance-[name]: Poor                              Cognition Arousal/Alertness: Awake/alert Behavior During Therapy: WFL for tasks assessed/performed Overall Cognitive Status: Within Functional Limits for tasks assessed                                 General Comments: HOH        Exercises Other Exercises Other Exercises: pt. able to demonstrate b ue bed level exercises  utilizing bed rails and non resistance b ue exercises with focus on chest, and biceps.  uses bed rails for pulling in prep for also continuing with increasing safety and independence with bed mobility.  good initiation of rest breaks. required heavy cueing and demo for plb strategies but able to return demo once instructed    General  Comments General comments (skin integrity, edema, etc.): SpO2 with unreliable reading from pleth during session, typically when pt has good waveform sats remain in 90s. Pt denies SOB with mobility this session      Pertinent Vitals/Pain Pain Assessment: No/denies pain    Home Living                          Prior Function            PT Goals (current goals can now be found in the care plan section) Acute Rehab PT Goals Patient Stated Goal: to go home Progress towards PT goals: Not progressing toward goals - comment (limited by fatigue and weakness)    Frequency    Min 3X/week      PT Plan Current plan remains appropriate    Co-evaluation              AM-PAC PT "6 Clicks" Mobility   Outcome Measure  Help needed turning from your back to your side while in a flat bed without using bedrails?: A Little Help needed moving from lying on your back to sitting on the side of a flat bed without using bedrails?: A Little Help needed moving to and from a bed to a chair (including a wheelchair)?: A Little Help needed standing up from a chair using your arms (e.g., wheelchair or bedside chair)?: A Little Help needed to walk in hospital room?: Total Help needed climbing 3-5 steps with a railing? : Total 6 Click Score: 14    End of Session   Activity Tolerance: Patient limited by fatigue Patient left: in bed;with call bell/phone within reach;with bed alarm set Nurse Communication: Mobility status PT Visit Diagnosis: Muscle weakness (generalized) (M62.81);Other symptoms and signs involving the nervous system (R29.898);Difficulty in walking, not elsewhere classified (R26.2);Other abnormalities of gait and mobility (R26.89)     Time: 5009-3818 PT Time Calculation (min) (ACUTE ONLY): 33 min  Charges:  $Gait Training: 8-22 mins $Therapeutic Activity: 8-22 mins                     Zenaida Niece, PT, DPT Acute Rehabilitation Pager: [phone] Office  [place]atis [place]ji  04/26/2021, 3:02 PM

## 2021-04-26 NOTE — Plan of Care (Signed)
°  Problem: Pain Managment: Goal: General experience of comfort will improve Outcome: Progressing   Problem: Safety: Goal: Ability to remain free from injury will improve Outcome: Progressing   Problem: Education: Goal: Knowledge of risk factors and measures for prevention of condition will improve Outcome: Progressing   Problem: Respiratory: Goal: Will maintain a patent airway Outcome: Progressing   Problem: Respiratory: Goal: Complications related to the disease process, condition or treatment will be avoided or minimized Outcome: Progressing

## 2021-04-27 DIAGNOSIS — I509 Heart failure, unspecified: Secondary | ICD-10-CM | POA: Diagnosis not present

## 2021-04-27 DIAGNOSIS — E119 Type 2 diabetes mellitus without complications: Secondary | ICD-10-CM

## 2021-04-27 LAB — GLUCOSE, CAPILLARY
Glucose-Capillary: 83 mg/dL (ref 70–99)
Glucose-Capillary: 59 mg/dL — ABNORMAL LOW (ref 70–99)
Glucose-Capillary: 42 mg/dL — CL (ref 70–99)
Glucose-Capillary: 62 mg/dL — ABNORMAL LOW (ref 70–99)
Glucose-Capillary: 69 mg/dL — ABNORMAL LOW (ref 70–99)
Glucose-Capillary: 33 mg/dL — CL (ref 70–99)
Glucose-Capillary: 94 mg/dL (ref 70–99)

## 2021-04-27 LAB — CBC
HCT: 46.3 % (ref 39.0–52.0)
Hemoglobin: 15.4 g/dL (ref 13.0–17.0)
MCH: 29.4 pg (ref 26.0–34.0)
MCHC: 33.3 g/dL (ref 30.0–36.0)
MCV: 88.4 fL (ref 80.0–100.0)
Platelets: 499 10*3/uL — ABNORMAL HIGH (ref 150–400)
RBC: 5.24 MIL/uL (ref 4.22–5.81)
RDW: 18 % — ABNORMAL HIGH (ref 11.5–15.5)
WBC: 10 10*3/uL (ref 4.0–10.5)
nRBC: 0 % (ref 0.0–0.2)

## 2021-04-27 LAB — BASIC METABOLIC PANEL
Anion gap: 10 (ref 5–15)
BUN: 41 mg/dL — ABNORMAL HIGH (ref 8–23)
CO2: 20 mmol/L — ABNORMAL LOW (ref 22–32)
Calcium: 8.1 mg/dL — ABNORMAL LOW (ref 8.9–10.3)
Chloride: 102 mmol/L (ref 98–111)
Creatinine, Ser: 1.59 mg/dL — ABNORMAL HIGH (ref 0.61–1.24)
GFR, Estimated: 41 mL/min — ABNORMAL LOW (ref >60)
Glucose, Bld: 117 mg/dL — ABNORMAL HIGH (ref 70–99)
Potassium: 5.1 mmol/L (ref 3.5–5.1)
Sodium: 132 mmol/L — ABNORMAL LOW (ref 135–145)

## 2021-04-27 MED ORDER — FUROSEMIDE 10 MG/ML IJ SOLN
20.0000 mg | Freq: Once | INTRAMUSCULAR | Status: AC
  Administered 2021-04-27: 20 mg via INTRAVENOUS
  Filled 2021-04-27: qty 2

## 2021-04-27 MED ORDER — FUROSEMIDE 20 MG PO TABS
20.0000 mg | ORAL_TABLET | Freq: Every day | ORAL | Status: DC
  Administered 2021-04-28: 20 mg via ORAL
  Filled 2021-04-27: qty 1

## 2021-04-27 MED ORDER — ENSURE ENLIVE PO LIQD
237.0000 mL | Freq: Two times a day (BID) | ORAL | Status: DC
  Administered 2021-04-27 – 2021-04-28 (×2): 237 mL via ORAL

## 2021-04-27 NOTE — Progress Notes (Addendum)
[facility] PROGRESS NOTE   Kevin Schwartz IRW:[id number] DOB: [birth year] DOA: 04/16/2021  PCP: [name], MD  Brief History/Interval Summary:  [age] y.o. male with medical history significant of CAD with stenting on Plavix and Eliquis regimen, chronic systolic CHF LVEF 76-19%, CKD stage IIIa, HTN, HLD, BPH, IIDM, presented with increasing shortness of breath and cough and leg swelling.  Patient tested positive for COVID-19.  Imaging studies showed left upper lobe consolidation and bilateral groundglass opacities.  Patient was hospitalized for further management.  Given Remdesivir and steroids.  Subsequently developed acute kidney injury.  Nephrology was consulted.  Seems to be gradually improving.  Will need to go to SNF when medically ready for discharge.  Reason for Visit: Pneumonia due to COVID-19.  Acute systolic CHF.  Acute kidney injury  Consultants: Nephrology  Procedures: None    Subjective/Interval History:  No significant events overnight, patient denies any complaints.  He had a low CBG reading this morning, he was asymptomatic, this resolved after he drank orange juice, I did add Ensure to his diet.  Assessment/Plan:  Pneumonia due to COVID-19/sepsis, present on admission Patient had sepsis criteria on admission.  Noted to be tachypneic, tachycardic with elevated WBC.  He also had elevated lactic acid level. It is felt that his presentation was primarily due to COVID-19 with some concern for secondary bacterial infection.  Procalcitonin was 0.8. Had mild hemoptysis which could be from pneumonia.  Patient does not have any symptoms or signs otherwise suggestive of thromboembolic process.  He is already on anticoagulation for a flutter. However his D-dimer was noted to be elevated which could from COVID-19.  A lower extremity Doppler study was negative for DVT.   CRP was elevated at 12.7 and then improved to 5.7.  Leukocytosis was due to steroids and has  improved. Patient has completed 5 days of Remdesivir.  He has also completed 5 days of ceftriaxone.   Continue with dexamethasone to complete a 10-day course. Patient' patient's respiratory status is stable.  He seems to be back to baseline.  Saturating normal on room air.  Acute respiratory failure with hypoxia Initially due to pneumonia from COVID-19 and subsequently from fluid overload.  Now back to baseline.  Off of oxygen. - resolved, he is currently on room air.  Acute on chronic kidney disease stage IIIa/hyponatremia/hyperkalemia Baseline creatinine seems to be around 1.3-1.6.   Renal function worsened and creatinine went up to 2.70 .  He also has hyponatremia and hyperkalemia.  He was given Lokelma. Acute kidney injury thought to be ATN from COVID-19. Renal ultrasound did not show hydronephrosis. Creatinine slowly improving.  Urine output is picked up.  Lasix remains on hold.  Remains on sodium bicarbonate. -Creatinine is improving, it is 1.8 today, renal input greatly appreciated, renal has signed off, they will arrange for outpatient follow-up.  -Continue with oral bicarb -Continue to hold Lasix.  Acute on chronic systolic CHF/moderate to severe mitral regurgitation EF is noted to be 45 to 50% on echocardiogram done in April 2022.  He was noted to have lower extremity edema.  He was given a low-dose of furosemide.  Had drop in his blood pressure. Echocardiogram was repeated since patient remains dyspneic with concern for fluid overload. Continues to show EF of 45 to 50% with global hypokinesis of the left ventricle.  Right ventricular function was normal.  Moderately elevated pulmonary artery systolic pressures were noted.  Moderate to severe MR was noted.  MR was mild  on echocardiogram of April 2022.  Will need to be seen by cardiology but could be done in the outpatient setting.  He is followed by [facility] cardiology. Blood pressure has stabilized.  Cortisol level was 8.9. Cardiac  medications on hold due to low blood pressures.  Prior to admission he was on amlodipine, furosemide.  Will not reinitiate at this time as we wait for kidneys to recover. Not on beta-blocker due to heart block.  Not on ACE inhibitor or ARB or Entresto due to history of angioedema on ACE inhibitor and ARB. -We will repeat labs, if creatinine is improving, will resume him back on home dose Lasix.  Acute metabolic encephalopathy Likely due to COVID-19.  Seems to have resolved.  Back to baseline now.  History of coronary artery disease Stable.  Continue Plavix.  Diabetes mellitus type 2, with renal complications, with chronic kidney disease stage IIIa He was noted to have hypoglycemia initially.  Wilder Glade was held.  CBGs have stabilized.  HbA1c 6.2.   -She is with multiple low CBG readings, but he is asymptomatic, it is difficult to obtain CBG through fingersticks, he is totally asymptomatic with such low reading as 42, 33, likely these readings are related to errors, will obtain stat BMP.  To check glucose level.  History of a flutter, unspecified Followed by cardiology as outpatient.  On Eliquis for stroke prevention.  Addendum -Blood glucose on BMP is 117, so these low readings and CBG likely erroneous, as he has been having dry skin in his digits and no signs of hypoglycemia to correlate with these low readings.   DVT Prophylaxis: On Eliquis Code Status: Full code Family Communication: Discussed with son at bedside Disposition Plan: SNF recommended by physical therapy.  Hopefully discharge when he finishes his COVID isolation.   Patient is medically ready for discharge, awaiting SNF bed availability/insurance authorization, he did finish COVID-19 isolation.. Status is: Inpatient     Medications: Scheduled:  allopurinol  100 mg Oral Daily   apixaban  2.5 mg Oral BID   atorvastatin  80 mg Oral Daily   citalopram  20 mg Oral Daily   clopidogrel  75 mg Oral Q breakfast   ezetimibe  10  mg Oral Daily   feeding supplement  237 mL Oral BID BM   insulin aspart  0-9 Units Subcutaneous TID WC   Ipratropium-Albuterol  1 puff Inhalation BID   sodium bicarbonate  650 mg Oral BID   sodium chloride flush  3 mL Intravenous Q12H   Continuous:  sodium chloride     ZOX:WRUEAV chloride, acetaminophen, guaiFENesin-dextromethorphan, ondansetron (ZOFRAN) IV, sodium chloride flush  Antibiotics: Anti-infectives (From admission, onward)    Start     Dose/Rate Route Frequency Ordered Stop   04/17/21 1000  remdesivir 100 mg in sodium chloride 0.9 % 100 mL IVPB       See Hyperspace for full Linked Orders Report.   100 mg 200 mL/hr over 30 Minutes Intravenous Daily 04/16/21 1640 04/20/21 1118   04/17/21 1000  cefTRIAXone (ROCEPHIN) 1 g in sodium chloride 0.9 % 100 mL IVPB        1 g 200 mL/hr over 30 Minutes Intravenous Every 24 hours 04/16/21 1734 04/21/21 1219   04/16/21 2000  remdesivir 200 mg in sodium chloride 0.9% 250 mL IVPB       See Hyperspace for full Linked Orders Report.   200 mg 580 mL/hr over 30 Minutes Intravenous Once 04/16/21 1640 04/16/21 2039   04/16/21 1500  doxycycline (VIBRAMYCIN) 100 mg in sodium chloride 0.9 % 250 mL IVPB        100 mg 125 mL/hr over 120 Minutes Intravenous  Once 04/16/21 1448 04/16/21 1912   04/16/21 1445  cefTRIAXone (ROCEPHIN) 1 g in sodium chloride 0.9 % 100 mL IVPB        1 g 200 mL/hr over 30 Minutes Intravenous  Once 04/16/21 1441 04/16/21 1659   04/16/21 1445  azithromycin (ZITHROMAX) 500 mg in sodium chloride 0.9 % 250 mL IVPB  Status:  Discontinued        500 mg 250 mL/hr over 60 Minutes Intravenous  Once 04/16/21 1441 04/16/21 1448       Objective:  Vital Signs  Vitals:   04/27/21 0329 04/27/21 0500 04/27/21 0749 04/27/21 1148  BP: 116/70  115/74 125/66  Pulse: 68  73 72  Resp: 17  19 16   Temp: 97.6 F (36.4 C)  97.8 F (36.6 C) 97.9 F (36.6 C)  TempSrc: Oral  Oral Oral  SpO2: 97%  95% 96%  Weight:  82 kg    Height:         Intake/Output Summary (Last 24 hours) at 04/27/2021 1332 Last data filed at 04/27/2021 1242 Gross per 24 hour  Intake 340 ml  Output --  Net 340 ml   Filed Weights   04/25/21 0452 04/26/21 0500 04/27/21 0500  Weight: 81.5 kg 83.2 kg 82 kg    Awake Alert, frail, laying in bed in no apparent distress, good air entry bilaterally, no edema, or cyanosis.  He is sitting in the chair eating lunch, appropriate.   Lab Results:  Data Reviewed: I have personally reviewed following labs and imaging studies  CBC: Recent Labs  Lab 04/23/21 0040 04/25/21 0103  WBC 6.8 7.0  HGB 12.9* 12.8*  HCT 36.4* 37.4*  MCV 85.2 86.0  PLT 383 424*    Basic Metabolic Panel: Recent Labs  Lab 04/21/21 0107 04/22/21 0128 04/23/21 0040 04/24/21 0052 04/25/21 0103  NA 131* 132* 134* 132* 132*  K 4.9 5.1 4.6 4.6 4.3  CL 101 103 105 103 102  CO2 18* 19* 21* 19* 21*  GLUCOSE 151* 133* 140* 203* 180*  BUN 68* 63* 59* 55* 52*  CREATININE 2.56* 2.42* 2.23* 2.09* 1.83*  CALCIUM 7.5* 7.9* 7.8* 7.9* 7.9*  PHOS 4.1 4.0 3.7 3.1 2.6    GFR: Estimated Creatinine Clearance: 28.6 mL/min (A) (by C-G formula based on SCr of 1.83 mg/dL (H)).  Liver Function Tests: Recent Labs  Lab 04/21/21 0107 04/22/21 0128 04/23/21 0040 04/24/21 0052 04/25/21 0103  ALBUMIN 2.4* 2.5* 2.3* 2.3* 2.2*     Coagulation Profile: No results for input(s): INR, PROTIME in the last 168 hours. CBG: Recent Labs  Lab 04/27/21 0751 04/27/21 1200 04/27/21 1231 04/27/21 1259 04/27/21 1327  GLUCAP 83 59* 62* 69* 42*      No results found for this or any previous visit (from the past 240 hour(s)).      Radiology Studies: No results found.     LOS: 11 days   Investment banker, corporate on [email or web address]  04/27/2021, 1:32 PM  Patient ID: Damieon Armendariz, male   DOB: 11/19/1930, 88 y.o.   MRN: [id number] Patient ID: Delante Karapetyan, male   DOB: [birth year], 88 y.o.   MRN:  [id number] Patient ID: Molly Maselli, male   DOB: 01/22/1931, 88 y.o.   MRN: [id number] Patient ID: Orange Hilligoss, male   DOB: 1930/07/29,  88 y.o.   MRN: [id number]

## 2021-04-27 NOTE — Plan of Care (Signed)
°  Problem: Education: Goal: Knowledge of General Education information will improve Description: Including pain rating scale, medication(s)/side effects and non-pharmacologic comfort measures Outcome: Progressing   Problem: Pain Managment: Goal: General experience of comfort will improve Outcome: Progressing   Problem: Safety: Goal: Ability to remain free from injury will improve Outcome: Progressing   Problem: Education: Goal: Knowledge of risk factors and measures for prevention of condition will improve Outcome: Progressing

## 2021-04-28 DIAGNOSIS — D62 Acute posthemorrhagic anemia: Secondary | ICD-10-CM | POA: Diagnosis not present

## 2021-04-28 DIAGNOSIS — A419 Sepsis, unspecified organism: Secondary | ICD-10-CM | POA: Diagnosis not present

## 2021-04-28 DIAGNOSIS — R41841 Cognitive communication deficit: Secondary | ICD-10-CM | POA: Diagnosis not present

## 2021-04-28 DIAGNOSIS — S81802A Unspecified open wound, left lower leg, initial encounter: Secondary | ICD-10-CM | POA: Diagnosis not present

## 2021-04-28 DIAGNOSIS — I483 Typical atrial flutter: Secondary | ICD-10-CM | POA: Diagnosis not present

## 2021-04-28 DIAGNOSIS — U099 Post covid-19 condition, unspecified: Secondary | ICD-10-CM | POA: Diagnosis not present

## 2021-04-28 DIAGNOSIS — I441 Atrioventricular block, second degree: Secondary | ICD-10-CM | POA: Diagnosis present

## 2021-04-28 DIAGNOSIS — I5021 Acute systolic (congestive) heart failure: Secondary | ICD-10-CM | POA: Diagnosis not present

## 2021-04-28 DIAGNOSIS — I130 Hypertensive heart and chronic kidney disease with heart failure and stage 1 through stage 4 chronic kidney disease, or unspecified chronic kidney disease: Secondary | ICD-10-CM | POA: Diagnosis not present

## 2021-04-28 DIAGNOSIS — I509 Heart failure, unspecified: Secondary | ICD-10-CM | POA: Diagnosis not present

## 2021-04-28 DIAGNOSIS — R079 Chest pain, unspecified: Secondary | ICD-10-CM | POA: Diagnosis not present

## 2021-04-28 DIAGNOSIS — R0989 Other specified symptoms and signs involving the circulatory and respiratory systems: Secondary | ICD-10-CM | POA: Diagnosis not present

## 2021-04-28 DIAGNOSIS — Z96652 Presence of left artificial knee joint: Secondary | ICD-10-CM | POA: Diagnosis present

## 2021-04-28 DIAGNOSIS — M6281 Muscle weakness (generalized): Secondary | ICD-10-CM | POA: Diagnosis not present

## 2021-04-28 DIAGNOSIS — J9610 Chronic respiratory failure, unspecified whether with hypoxia or hypercapnia: Secondary | ICD-10-CM | POA: Diagnosis not present

## 2021-04-28 DIAGNOSIS — I2510 Atherosclerotic heart disease of native coronary artery without angina pectoris: Secondary | ICD-10-CM | POA: Diagnosis not present

## 2021-04-28 DIAGNOSIS — Z7401 Bed confinement status: Secondary | ICD-10-CM | POA: Diagnosis not present

## 2021-04-28 DIAGNOSIS — I5043 Acute on chronic combined systolic (congestive) and diastolic (congestive) heart failure: Secondary | ICD-10-CM | POA: Diagnosis not present

## 2021-04-28 DIAGNOSIS — J1282 Pneumonia due to coronavirus disease 2019: Secondary | ICD-10-CM | POA: Diagnosis not present

## 2021-04-28 DIAGNOSIS — I5042 Chronic combined systolic (congestive) and diastolic (congestive) heart failure: Secondary | ICD-10-CM | POA: Diagnosis not present

## 2021-04-28 DIAGNOSIS — E1169 Type 2 diabetes mellitus with other specified complication: Secondary | ICD-10-CM | POA: Diagnosis not present

## 2021-04-28 DIAGNOSIS — R040 Epistaxis: Secondary | ICD-10-CM | POA: Diagnosis not present

## 2021-04-28 DIAGNOSIS — E119 Type 2 diabetes mellitus without complications: Secondary | ICD-10-CM | POA: Diagnosis not present

## 2021-04-28 DIAGNOSIS — N400 Enlarged prostate without lower urinary tract symptoms: Secondary | ICD-10-CM | POA: Diagnosis present

## 2021-04-28 DIAGNOSIS — Z7901 Long term (current) use of anticoagulants: Secondary | ICD-10-CM | POA: Diagnosis not present

## 2021-04-28 DIAGNOSIS — M109 Gout, unspecified: Secondary | ICD-10-CM | POA: Diagnosis present

## 2021-04-28 DIAGNOSIS — Z66 Do not resuscitate: Secondary | ICD-10-CM | POA: Diagnosis not present

## 2021-04-28 DIAGNOSIS — E782 Mixed hyperlipidemia: Secondary | ICD-10-CM | POA: Diagnosis present

## 2021-04-28 DIAGNOSIS — F32A Depression, unspecified: Secondary | ICD-10-CM | POA: Diagnosis not present

## 2021-04-28 DIAGNOSIS — I152 Hypertension secondary to endocrine disorders: Secondary | ICD-10-CM | POA: Diagnosis not present

## 2021-04-28 DIAGNOSIS — M6258 Muscle wasting and atrophy, not elsewhere classified, other site: Secondary | ICD-10-CM | POA: Diagnosis not present

## 2021-04-28 DIAGNOSIS — I083 Combined rheumatic disorders of mitral, aortic and tricuspid valves: Secondary | ICD-10-CM | POA: Diagnosis present

## 2021-04-28 DIAGNOSIS — N183 Chronic kidney disease, stage 3 unspecified: Secondary | ICD-10-CM | POA: Diagnosis not present

## 2021-04-28 DIAGNOSIS — R0600 Dyspnea, unspecified: Secondary | ICD-10-CM | POA: Diagnosis not present

## 2021-04-28 DIAGNOSIS — R601 Generalized edema: Secondary | ICD-10-CM | POA: Diagnosis not present

## 2021-04-28 DIAGNOSIS — I4891 Unspecified atrial fibrillation: Secondary | ICD-10-CM | POA: Diagnosis not present

## 2021-04-28 DIAGNOSIS — F17210 Nicotine dependence, cigarettes, uncomplicated: Secondary | ICD-10-CM | POA: Diagnosis present

## 2021-04-28 DIAGNOSIS — E1122 Type 2 diabetes mellitus with diabetic chronic kidney disease: Secondary | ICD-10-CM | POA: Diagnosis not present

## 2021-04-28 DIAGNOSIS — M2550 Pain in unspecified joint: Secondary | ICD-10-CM | POA: Diagnosis not present

## 2021-04-28 DIAGNOSIS — J9601 Acute respiratory failure with hypoxia: Secondary | ICD-10-CM | POA: Diagnosis not present

## 2021-04-28 DIAGNOSIS — R778 Other specified abnormalities of plasma proteins: Secondary | ICD-10-CM | POA: Diagnosis not present

## 2021-04-28 DIAGNOSIS — R0602 Shortness of breath: Secondary | ICD-10-CM | POA: Diagnosis not present

## 2021-04-28 DIAGNOSIS — E1159 Type 2 diabetes mellitus with other circulatory complications: Secondary | ICD-10-CM | POA: Diagnosis not present

## 2021-04-28 DIAGNOSIS — J984 Other disorders of lung: Secondary | ICD-10-CM | POA: Diagnosis not present

## 2021-04-28 DIAGNOSIS — Z8616 Personal history of COVID-19: Secondary | ICD-10-CM | POA: Diagnosis not present

## 2021-04-28 DIAGNOSIS — R262 Difficulty in walking, not elsewhere classified: Secondary | ICD-10-CM | POA: Diagnosis not present

## 2021-04-28 DIAGNOSIS — I5022 Chronic systolic (congestive) heart failure: Secondary | ICD-10-CM | POA: Diagnosis not present

## 2021-04-28 DIAGNOSIS — N189 Chronic kidney disease, unspecified: Secondary | ICD-10-CM

## 2021-04-28 DIAGNOSIS — G4733 Obstructive sleep apnea (adult) (pediatric): Secondary | ICD-10-CM | POA: Diagnosis present

## 2021-04-28 DIAGNOSIS — R0902 Hypoxemia: Secondary | ICD-10-CM | POA: Diagnosis not present

## 2021-04-28 DIAGNOSIS — I4892 Unspecified atrial flutter: Secondary | ICD-10-CM | POA: Diagnosis not present

## 2021-04-28 DIAGNOSIS — R1312 Dysphagia, oropharyngeal phase: Secondary | ICD-10-CM | POA: Diagnosis not present

## 2021-04-28 DIAGNOSIS — R58 Hemorrhage, not elsewhere classified: Secondary | ICD-10-CM | POA: Diagnosis not present

## 2021-04-28 DIAGNOSIS — R042 Hemoptysis: Secondary | ICD-10-CM | POA: Diagnosis not present

## 2021-04-28 DIAGNOSIS — D649 Anemia, unspecified: Secondary | ICD-10-CM | POA: Diagnosis not present

## 2021-04-28 DIAGNOSIS — G9340 Encephalopathy, unspecified: Secondary | ICD-10-CM | POA: Diagnosis not present

## 2021-04-28 DIAGNOSIS — I10 Essential (primary) hypertension: Secondary | ICD-10-CM | POA: Diagnosis not present

## 2021-04-28 DIAGNOSIS — J189 Pneumonia, unspecified organism: Secondary | ICD-10-CM | POA: Diagnosis not present

## 2021-04-28 DIAGNOSIS — Z029 Encounter for administrative examinations, unspecified: Secondary | ICD-10-CM | POA: Diagnosis not present

## 2021-04-28 DIAGNOSIS — I340 Nonrheumatic mitral (valve) insufficiency: Secondary | ICD-10-CM | POA: Diagnosis not present

## 2021-04-28 DIAGNOSIS — Z8701 Personal history of pneumonia (recurrent): Secondary | ICD-10-CM | POA: Diagnosis not present

## 2021-04-28 DIAGNOSIS — N179 Acute kidney failure, unspecified: Secondary | ICD-10-CM | POA: Diagnosis not present

## 2021-04-28 DIAGNOSIS — I739 Peripheral vascular disease, unspecified: Secondary | ICD-10-CM | POA: Diagnosis not present

## 2021-04-28 DIAGNOSIS — R2689 Other abnormalities of gait and mobility: Secondary | ICD-10-CM | POA: Diagnosis not present

## 2021-04-28 DIAGNOSIS — Z20822 Contact with and (suspected) exposure to covid-19: Secondary | ICD-10-CM | POA: Diagnosis not present

## 2021-04-28 DIAGNOSIS — I255 Ischemic cardiomyopathy: Secondary | ICD-10-CM | POA: Diagnosis present

## 2021-04-28 DIAGNOSIS — N182 Chronic kidney disease, stage 2 (mild): Secondary | ICD-10-CM | POA: Diagnosis not present

## 2021-04-28 DIAGNOSIS — L988 Other specified disorders of the skin and subcutaneous tissue: Secondary | ICD-10-CM | POA: Diagnosis not present

## 2021-04-28 DIAGNOSIS — L97412 Non-pressure chronic ulcer of right heel and midfoot with fat layer exposed: Secondary | ICD-10-CM | POA: Diagnosis not present

## 2021-04-28 DIAGNOSIS — N1832 Chronic kidney disease, stage 3b: Secondary | ICD-10-CM | POA: Diagnosis not present

## 2021-04-28 DIAGNOSIS — R6520 Severe sepsis without septic shock: Secondary | ICD-10-CM | POA: Diagnosis not present

## 2021-04-28 DIAGNOSIS — I110 Hypertensive heart disease with heart failure: Secondary | ICD-10-CM | POA: Diagnosis not present

## 2021-04-28 DIAGNOSIS — D472 Monoclonal gammopathy: Secondary | ICD-10-CM | POA: Diagnosis present

## 2021-04-28 DIAGNOSIS — R531 Weakness: Secondary | ICD-10-CM | POA: Diagnosis not present

## 2021-04-28 DIAGNOSIS — U071 COVID-19: Secondary | ICD-10-CM | POA: Diagnosis not present

## 2021-04-28 LAB — GLUCOSE, CAPILLARY
Glucose-Capillary: 77 mg/dL (ref 70–99)
Glucose-Capillary: 101 mg/dL — ABNORMAL HIGH (ref 70–99)
Glucose-Capillary: 95 mg/dL (ref 70–99)

## 2021-04-28 MED ORDER — APIXABAN 2.5 MG PO TABS: 2.5000 mg | ORAL_TABLET | Freq: Two times a day (BID) | ORAL | Status: DC

## 2021-04-28 NOTE — TOC Progression Note (Signed)
Transition of Care [facility]) - Progression Note    Patient Details  Name: Kevin Schwartz MRN: [id number] Date of Birth: 1930/05/12  [facility]) CM/SW Contact  Elliot Gurney [place], [place] Phone Number: [phone] 04/28/2021, 10:22 AM  Clinical Narrative:    Phone call to [facility] to check status of authorization for SNF placement. Message left for a return call.  [address] LCSW Transition of Care [phone]    Expected Discharge Plan: Skilled Nursing Facility Barriers to Discharge: SNF Covid  Expected Discharge Plan and Services Expected Discharge Plan: [place] In-house Referral: Clinical Social Work   Post Acute Care Choice: [place] Living arrangements for the past 2 months: Hotel/Motel                                       Social Determinants of Health (SDOH) Interventions    Readmission Risk Interventions No flowsheet data found.

## 2021-04-28 NOTE — Plan of Care (Signed)
  Problem: Activity: Goal: Risk for activity intolerance will decrease Outcome: Progressing   Problem: Pain Managment: Goal: General experience of comfort will improve Outcome: Progressing   Problem: Safety: Goal: Ability to remain free from injury will improve Outcome: Progressing   

## 2021-04-28 NOTE — Discharge Summary (Signed)
Physician Discharge Summary  Kevin Schwartz MEQ:[id number] DOB: 04-14-1931 DOA: 04/16/2021  PCP: [name], MD  Admit date: 04/16/2021 Discharge date: 04/28/2021  Admitted From: Home Disposition:  SNF  Recommendations for Outpatient Follow-up:  Please obtain BMP/CBC in one week Patient CBG has been controlled, monitor blood glucose intermittently  Adjust diuresis as needed.   Discharge Condition:Stable CODE STATUS:FULL Diet recommendation: Heart Healthy / Carb Modified   Brief/Interim Summary:   88 y.o. male with medical history significant of CAD with stenting on Plavix and Eliquis regimen, chronic systolic CHF LVEF 29-79%, CKD stage IIIa, HTN, HLD, BPH, IIDM, presented with increasing shortness of breath and cough and leg swelling.  Patient tested positive for COVID-19.  Imaging studies showed left upper lobe consolidation and bilateral groundglass opacities.  Patient was hospitalized for further management.  Given Remdesivir and steroids.  Subsequently developed acute kidney injury.  Nephrology was consulted.  Renal function has improved, patient has stabilized.  Pneumonia due to COVID-19/sepsis, present on admission Patient had sepsis criteria on admission.  Noted to be tachypneic, tachycardic with elevated WBC.  He also had elevated lactic acid level. It is felt that his presentation was primarily due to COVID-19 with some concern for secondary bacterial infection.  Procalcitonin was 0.8. Had mild hemoptysis which could be from pneumonia.  Patient does not have any symptoms or signs otherwise suggestive of thromboembolic process.  He is already on anticoagulation for a flutter. However his D-dimer was noted to be elevated which could from COVID-19.  A lower extremity Doppler study was negative for DVT.   CRP was elevated at 12.7 and then improved to 5.7.  Leukocytosis was due to steroids and has improved. Patient has completed 5 days of Remdesivir.  He has also completed 5  days of ceftriaxone.   He was treated with total 10 days of Decadron. Patient' patient's respiratory status is stable.  He seems to be back to baseline.  Saturating normal on room air.   Acute respiratory failure with hypoxia Initially due to pneumonia from COVID-19 and subsequently from fluid overload.  Now back to baseline.  Off of oxygen. - resolved, he is currently on room air.   Acute on chronic kidney disease stage IIIa/hyponatremia/hyperkalemia Baseline creatinine seems to be around 1.3-1.6.   Renal function worsened and creatinine went up to 2.70 .  He also has hyponatremia and hyperkalemia.  He was given Lokelma. Acute kidney injury thought to be ATN from COVID-19. Renal ultrasound did not show hydronephrosis. Creatinine slowly improving.  Urine output is picked up.  Lasix remains on hold.  Remains on sodium bicarbonate. -Continued has improved, back to baseline, 1.59 yesterday. -He is resumed on his home dose Lasix . -  renal input greatly appreciated, renal has signed off, they will arrange for outpatient follow-up.    Acute on chronic systolic CHF/moderate to severe mitral regurgitation EF is noted to be 45 to 50% on echocardiogram done in April 2022.  He was noted to have lower extremity edema.  He was given a low-dose of furosemide.  Had drop in his blood pressure. Echocardiogram was repeated since patient remains dyspneic with concern for fluid overload. Continues to show EF of 45 to 50% with global hypokinesis of the left ventricle.  Right ventricular function was normal.  Moderately elevated pulmonary artery systolic pressures were noted.  Moderate to severe MR was noted.  MR was mild on echocardiogram of April 2022.  Will need to be seen by cardiology but could be  done in the outpatient setting.  He is followed by [facility] cardiology. Blood pressure has stabilized.  Cortisol level was 8.9. Cardiac medications on hold due to low blood pressures.   - Not on beta-blocker due to heart  block.  Not on ACE inhibitor or ARB or Entresto due to history of angioedema on ACE inhibitor and ARB. -Now he started to develop some lower extremity edema, he is resumed back on his home dose Lasix   Acute metabolic encephalopathy Likely due to COVID-19.  Seems to have resolved.  Back to baseline now.   History of coronary artery disease Stable.  Continue Plavix.  Diabetes mellitus type 2, with renal complications, with chronic kidney disease stage IIIa -A1c is 6.2, patient was on Farxiga at home, and he was on insulin sliding scale during hospital stay, his CBGs overall has been controlled, did not require any insulin, to avoid hypoglycemia I will not initiate any sliding scale /or resume his home dose Farxiga as his CBGs are controlled without intervention  (Note patient had some erroneous CBG reading during hospital stay, most likely related to errors, as BMP obtained during these readings did show normal glucose level ).   History of a flutter, unspecified Followed by cardiology as outpatient.  On Eliquis for stroke prevention.  Eliquis dose has been adjusted to renal function, he is on 2.5 mg oral twice daily.        Discharge Diagnoses:  Principal Problem:   PNA (pneumonia) Active Problems:   Acute CHF (congestive heart failure) [facility])    Discharge Instructions  Discharge Instructions     Diet - low sodium heart healthy   Complete by: As directed    Discharge instructions   Complete by: As directed    Follow with Primary MD [name], MD /PCP physician  Get CBC, CMP,  checked  by Primary MD next visit.    Activity: As tolerated with Full fall precautions use walker/cane & assistance as needed   Disposition SNF   Diet: Heart Healthy/carb modified  For Heart failure patients - Check your Weight same time everyday, if you gain over 2 pounds, or you develop in leg swelling, experience more shortness of breath or chest pain, call your Primary MD immediately.  Follow Cardiac Low Salt Diet and 1.5 lit/day fluid restriction.   On your next visit with your primary care physician please Get Medicines reviewed and adjusted.   Please request your Prim.MD to go over all Hospital Tests and Procedure/Radiological results at the follow up, please get all Hospital records sent to your Prim MD by signing hospital release before you go home.   If you experience worsening of your admission symptoms, develop shortness of breath, life threatening emergency, suicidal or homicidal thoughts you must seek medical attention immediately by calling 911 or calling your MD immediately  if symptoms less severe.  You Must read complete instructions/literature along with all the possible adverse reactions/side effects for all the Medicines you take and that have been prescribed to you. Take any new Medicines after you have completely understood and accpet all the possible adverse reactions/side effects.   Do not drive, operating heavy machinery, perform activities at heights, swimming or participation in water activities or provide baby sitting services if your were admitted for syncope or siezures until you have seen by Primary MD or a Neurologist and advised to do so again.  Do not drive when taking Pain medications.    Do not take more than prescribed  Pain, Sleep and Anxiety Medications  Special Instructions: If you have smoked or chewed Tobacco  in the last 2 yrs please stop smoking, stop any regular Alcohol  and or any Recreational drug use.  Wear Seat belts while driving.   Please note  You were cared for by a hospitalist during your hospital stay. If you have any questions about your discharge medications or the care you received while you were in the hospital after you are discharged, you can call the unit and asked to speak with the hospitalist on call if the hospitalist that took care of you is not available. Once you are discharged, your primary care physician  will handle any further medical issues. Please note that NO REFILLS for any discharge medications will be authorized once you are discharged, as it is imperative that you return to your primary care physician (or establish a relationship with a primary care physician if you do not have one) for your aftercare needs so that they can reassess your need for medications and monitor your lab values.   Increase activity slowly   Complete by: As directed       Allergies as of 04/28/2021       Reactions   Lisinopril Swelling, Other (See Comments)   Angioedema   Losartan Potassium Swelling, Other (See Comments)   Angioedema   Crestor [rosuvastatin] Swelling, Other (See Comments)   Angioedema   Tape Other (See Comments)   Irritates the skin        Medication List     STOP taking these medications    amLODipine 5 MG tablet Commonly known as: NORVASC   azithromycin 250 MG tablet Commonly known as: ZITHROMAX   dapagliflozin propanediol 5 MG Tabs tablet Commonly known as: Farxiga   potassium chloride SA 20 MEQ tablet Commonly known as: KLOR-CON M       TAKE these medications    Accu-Chek Aviva Plus test strip Generic drug: glucose blood Use to check blood sugar daily. Dx:E11.8   Accu-Chek Softclix Lancets lancets Use to test blood sugar daily. Dx:E11.8   allopurinol 100 MG tablet Commonly known as: ZYLOPRIM Take 1 tablet (100 mg total) by mouth daily.   apixaban 2.5 MG Tabs tablet Commonly known as: ELIQUIS Take 1 tablet (2.5 mg total) by mouth 2 (two) times daily. What changed:  medication strength how much to take   atorvastatin 80 MG tablet Commonly known as: LIPITOR Take 1 tablet (80 mg total) by mouth daily.   B-D SINGLE USE SWABS REGULAR Pads Use with testing of blood sugar. Dx:E11.8   citalopram 20 MG tablet Commonly known as: CELEXA Take 1 tablet (20 mg total) by mouth daily.   clopidogrel 75 MG tablet Commonly known as: PLAVIX Take 1 tablet (75 mg  total) by mouth daily with breakfast.   ezetimibe 10 MG tablet Commonly known as: ZETIA Take 10 mg by mouth daily.   furosemide 20 MG tablet Commonly known as: LASIX TAKE 1 TABLET BY MOUTH EVERY DAY   lidocaine 5 % Commonly known as: Lidoderm Place 1 patch onto the skin daily. Remove & Discard patch within 12 hours or as directed by MD   nitroGLYCERIN 0.4 MG SL tablet Commonly known as: NITROSTAT DISSOLVE 1 TABLET UNDER THE TONGUE EVERY 5 MINUTES AS  NEEDED FOR CHEST PAIN. MAX  OF 3 TABLETS IN 15 MINUTES. CALL 911 IF PAIN PERSISTS. What changed:  how much to take how to take this when to take this reasons to  take this   Ultra-Thin II Mini Pen Needle 31G X 5 MM Misc Generic drug: Insulin Pen Needle        Contact information for after-discharge care     Destination     [facility] .   Service: Skilled Nursing Contact information: [place] [address] [phone]                    Allergies  Allergen Reactions   Lisinopril Swelling and Other (See Comments)    Angioedema    Losartan Potassium Swelling and Other (See Comments)    Angioedema    Crestor [Rosuvastatin] Swelling and Other (See Comments)    Angioedema    Tape Other (See Comments)    Irritates the skin    Consultations: Renal   Procedures/Studies: CT CHEST WO CONTRAST  Result Date: 04/16/2021 CLINICAL DATA:  Confusion, COVID-19 positive, pneumonia EXAM: CT CHEST WITHOUT CONTRAST TECHNIQUE: Multidetector CT imaging of the chest was performed following the standard protocol without IV contrast. COMPARISON:  04/16/2021 FINDINGS: Cardiovascular: Unenhanced imaging of the heart demonstrates cardiomegaly without pericardial effusion. Normal caliber of the thoracic aorta. Evaluation of the vascular lumen is limited without IV contrast. There is severe atherosclerosis of the coronary vasculature, with mild atherosclerosis of the thoracic aorta.  Mediastinum/Nodes: Borderline enlarged mediastinal and hilar lymph nodes are seen, measuring up to 15 mm in the subcarinal region. Evaluation of the hilar structures is limited without IV contrast. Thyroid, trachea, and esophagus are unremarkable. Lungs/Pleura: There is multifocal bilateral ground-glass airspace disease, compatible with given history of COVID pneumonia. Wedge-shaped dense consolidation within the left upper lobe corresponds to opacity on chest x-ray. This area measures approximately 4.5 x 4.0 cm. Differential in this region would include focal pneumonia of uncertain etiology, infarct, or underlying neoplasm. No effusion or pneumothorax.  Central airways are patent. Upper Abdomen: Nodularity of the liver capsule likely reflect underlying cirrhosis. Upper abdominal ascites. No acute finding. Musculoskeletal: No acute or destructive bony lesions. Bilateral shoulder osteoarthritis left greater than right. Reconstructed images demonstrate no additional findings. IMPRESSION: 1. Multifocal bilateral ground-glass airspace disease compatible with given history of COVID-19. 2. Wedge-shaped dense consolidation within the left upper lobe, measuring 4.5 x 4.0 cm, corresponding to chest x-ray findings. While this could reflect focal dense pneumonia, pulmonary infarct or neoplasm could give a similar appearance. If pulmonary embolus is a clinical concern, CT pulmonary angiography could be considered. Otherwise, follow-up imaging after appropriate medical management is recommended to assess for resolution. 3. Likely reactive mediastinal and hilar adenopathy. 4. Cardiomegaly. 5. Nodular liver contour compatible cirrhosis. Upper abdominal ascites. 6. Aortic Atherosclerosis (ICD10-I70.0). Coronary artery atherosclerosis. Electronically Signed   By: Randa Ngo M.D.   On: 04/16/2021 16:45   US RENAL  Result Date: 04/19/2021 CLINICAL DATA:  Acute on chronic renal failure EXAM: RENAL / URINARY TRACT ULTRASOUND  COMPLETE COMPARISON:  CT 09/30/2010 FINDINGS: Right Kidney: Renal measurements: 8.1 x 4.2 x 4.3 cm = volume: 77 mL. Cortex slightly echogenic. No mass or hydronephrosis. Left Kidney: Renal measurements: 9 x 4.3 x 4.7 cm = volume: 95.5 mL. Cortex is slightly echogenic. No mass or hydronephrosis. Bladder: Appears normal for degree of bladder distention. Other: Small amount of abdominal ascites. IMPRESSION: 1. Slightly echogenic kidneys consistent with medical renal disease. No hydronephrosis. 2. Small amount of ascites. Electronically Signed   By: Donavan Foil M.D.   On: 04/19/2021 23:37   DG CHEST PORT 1 VIEW  Result Date:  04/18/2021 CLINICAL DATA:  Hypoxia, rapid response EXAM: PORTABLE CHEST 1 VIEW COMPARISON:  04/17/2021 FINDINGS: Single frontal view of the chest demonstrates stable enlargement of the cardiac silhouette. Wedge-shaped consolidation within the left mid lung zone again noted and unchanged. Overall increase in the interstitial and ground-glass opacities elsewhere throughout the lungs, greatest at the bases. No effusion or pneumothorax. No acute bony abnormalities. IMPRESSION: 1. Persistent dense consolidation within the left midlung zone, which may reflect pneumonia, infarct, or neoplasm. Please see previous CT exam. 2. Worsening interstitial and ground-glass opacities throughout the lungs, compatible with progressive pneumonia or worsening edema. Electronically Signed   By: Randa Ngo M.D.   On: 04/18/2021 21:07   DG CHEST PORT 1 VIEW  Result Date: 04/17/2021 CLINICAL DATA:  CHF. EXAM: PORTABLE CHEST 1 VIEW COMPARISON:  04/16/2021. FINDINGS: The heart is enlarged. The pulmonary vasculature is within normal limits. Atherosclerotic calcification of the aorta is noted. A focal opacity is present in the mid left lung, slightly increased from the prior exam. A few scattered air space opacities are noted bilaterally. No effusion or pneumothorax. No acute osseous abnormality. IMPRESSION: 1. Focal  opacity in the mid left lung and scattered airspace opacities bilaterally, slightly increased from the prior exam. 2. Cardiomegaly. Electronically Signed   By: Brett Fairy M.D.   On: 04/17/2021 04:48   DG Chest Port 1 View  Result Date: 04/16/2021 CLINICAL DATA:  Questionable sepsis, left-sided chest pain EXAM: PORTABLE CHEST 1 VIEW COMPARISON:  07/31/2020 FINDINGS: Cardiomegaly. Focal, heterogeneous, somewhat masslike opacity of the peripheral left midlung. Osseous structures are unremarkable. IMPRESSION: 1. Focal, heterogeneous, somewhat masslike opacity of the peripheral left midlung, which may reflect infection in the appropriate clinical setting, mass not excluded. Consider CT to further evaluate. At minimum, recommend follow-up radiographs in 6-8 weeks to ensure complete radiographic resolution. 2. Cardiomegaly. Electronically Signed   By: Delanna Ahmadi M.D.   On: 04/16/2021 14:35   VAS [place] LOWER EXTREMITY VENOUS (DVT)  Result Date: 04/17/2021  Lower Venous DVT Study Patient Name:  DEUNDRE THONG [facility]  Date of Exam:   04/17/2021 Medical Rec #: [id number]                Accession #:    [id number] Date of Birth: Oct 27, 1930                 Patient Gender: M Patient Age:   21 years Exam Location:  [facility] Procedure:      VAS [place] LOWER EXTREMITY VENOUS (DVT) Referring Phys: Bonnielee Haff --------------------------------------------------------------------------------  Indications: Swelling.  Risk Factors: None identified. Limitations: Body habitus, poor ultrasound/tissue interface and patient positioning. Comparison Study: No prior studies. Performing Technologist: Oliver Hum RVT  Examination Guidelines: A complete evaluation includes B-mode imaging, spectral Doppler, color Doppler, and power Doppler as needed of all accessible portions of each vessel. Bilateral testing is considered an integral part of a complete examination. Limited examinations for reoccurring indications may be performed  as noted. The reflux portion of the exam is performed with the patient in reverse Trendelenburg.  +---------+---------------+---------+-----------+----------+--------------+  RIGHT     Compressibility Phasicity Spontaneity Properties Thrombus Aging  +---------+---------------+---------+-----------+----------+--------------+  CFV       Full            Yes       Yes                                    +---------+---------------+---------+-----------+----------+--------------+  SFJ       Full                                                             +---------+---------------+---------+-----------+----------+--------------+  FV Prox   Full                                                             +---------+---------------+---------+-----------+----------+--------------+  FV Mid    Full                                                             +---------+---------------+---------+-----------+----------+--------------+  FV Distal Full                                                             +---------+---------------+---------+-----------+----------+--------------+  PFV       Full                                                             +---------+---------------+---------+-----------+----------+--------------+  POP       Full            Yes       Yes                                    +---------+---------------+---------+-----------+----------+--------------+  PTV       Full                                                             +---------+---------------+---------+-----------+----------+--------------+  PERO      Full                                                             +---------+---------------+---------+-----------+----------+--------------+   +---------+---------------+---------+-----------+----------+--------------+  LEFT      Compressibility Phasicity Spontaneity Properties Thrombus Aging  +---------+---------------+---------+-----------+----------+--------------+  CFV       Full             Yes       Yes                                    +---------+---------------+---------+-----------+----------+--------------+  SFJ       Full                                                             +---------+---------------+---------+-----------+----------+--------------+  FV Prox   Full                                                             +---------+---------------+---------+-----------+----------+--------------+  FV Mid    Full                                                             +---------+---------------+---------+-----------+----------+--------------+  FV Distal Full            Yes       Yes                                    +---------+---------------+---------+-----------+----------+--------------+  PFV       Full                                                             +---------+---------------+---------+-----------+----------+--------------+  POP       Full            Yes       Yes                                    +---------+---------------+---------+-----------+----------+--------------+  PTV       Full                                                             +---------+---------------+---------+-----------+----------+--------------+  PERO      Full                                                             +---------+---------------+---------+-----------+----------+--------------+     Summary: RIGHT: - There is no evidence of deep vein thrombosis in the lower extremity. However, portions of this examination were limited- see technologist comments above.  - No cystic structure found in the popliteal fossa.  LEFT: - There is no evidence of deep vein thrombosis in the lower extremity. However, portions of this examination were limited- see technologist  comments above.  - No cystic structure found in the popliteal fossa.  *See table(s) above for measurements and observations. Electronically signed by Harold Barban MD on 04/17/2021 at 11:32:05 PM.    Final    ECHOCARDIOGRAM  LIMITED  Result Date: 04/19/2021    ECHOCARDIOGRAM LIMITED REPORT   Patient Name:   Kevin Schwartz [facility] Date of Exam: 04/19/2021 Medical Rec #:  [id number]               Height:       67.0 in Accession #:    [id number]              Weight:       182.5 lb Date of Birth:  [birth year]                BSA:          1.945 m Patient Age:    88 years                BP:           121/61 mmHg Patient Gender: M                       HR:           86 bpm. Exam Location:  Inpatient Procedure: Limited Echo, 3D Echo, Cardiac Doppler and Color Doppler Indications:    I50.40* Unspecified combined systolic (congestive) and diastolic                 (congestive) heart failure  History:        Patient has prior history of Echocardiogram examinations, most                 recent 08/01/2020. CHF, CAD, Abnormal ECG, Signs/Symptoms:Murmur                 and Chest Pain; Risk Factors:Diabetes and Hypertension. Covid                 positive.  Sonographer:    Roseanna Rainbow RDCS Referring Phys: [id number] [facility]  Sonographer Comments: Global longitudinal strain was attempted. IMPRESSIONS  1. Left ventricular ejection fraction, by estimation, is 45 to 50%. The left ventricle has mildly decreased function. The left ventricle demonstrates global hypokinesis.  2. Right ventricular systolic function is normal. The right ventricular size is normal. There is moderately elevated pulmonary artery systolic pressure.  3. Left atrial size was moderately dilated.  4. The mitral valve is abnormal. Moderate to severe mitral valve regurgitation. No evidence of mitral stenosis.  5. Tricuspid valve regurgitation is moderate.  6. The aortic valve is tricuspid. There is moderate calcification of the aortic valve. There is moderate thickening of the aortic valve. Aortic valve regurgitation is mild. Aortic valve sclerosis/calcification is present, without any evidence of aortic stenosis.  7. The inferior vena cava is normal in size with greater than 50% respiratory  variability, suggesting right atrial pressure of 3 mmHg. FINDINGS  Left Ventricle: Left ventricular ejection fraction, by estimation, is 45 to 50%. The left ventricle has mildly decreased function. The left ventricle demonstrates global hypokinesis. The left ventricular internal cavity size was normal in size. There is  no left ventricular hypertrophy. Right Ventricle: The right ventricular size is normal. No increase in right ventricular wall thickness. Right ventricular systolic function is normal. There is moderately elevated pulmonary artery systolic pressure. The tricuspid regurgitant velocity is 3.05 m/s, and with an assumed right  atrial pressure of 15 mmHg, the estimated right ventricular systolic pressure is 40.9 mmHg. Left Atrium: Left atrial size was moderately dilated. Right Atrium: Right atrial size was normal in size. Pericardium: There is no evidence of pericardial effusion. Mitral Valve: The mitral valve is abnormal. There is moderate thickening of the mitral valve leaflet(s). There is moderate calcification of the mitral valve leaflet(s). Moderate to severe mitral valve regurgitation. No evidence of mitral valve stenosis. MV peak gradient, 11.6 mmHg. The mean mitral valve gradient is 5.0 mmHg. Tricuspid Valve: The tricuspid valve is normal in structure. Tricuspid valve regurgitation is moderate . No evidence of tricuspid stenosis. Aortic Valve: The aortic valve is tricuspid. There is moderate calcification of the aortic valve. There is moderate thickening of the aortic valve. Aortic valve regurgitation is mild. Aortic regurgitation PHT measures 388 msec. Aortic valve sclerosis/calcification is present, without any evidence of aortic stenosis. Aortic valve mean gradient measures 4.5 mmHg. Aortic valve peak gradient measures 7.8 mmHg. Aortic valve area, by VTI measures 3.03 cm. Pulmonic Valve: The pulmonic valve was normal in structure. Pulmonic valve regurgitation is not visualized. No evidence of  pulmonic stenosis. Aorta: The aortic root is normal in size and structure. Venous: The inferior vena cava is normal in size with greater than 50% respiratory variability, suggesting right atrial pressure of 3 mmHg. IAS/Shunts: No atrial level shunt detected by color flow Doppler. LEFT VENTRICLE PLAX 2D LVIDd:         5.00 cm LVIDs:         3.80 cm LV PW:         1.20 cm LV IVS:        1.00 cm LVOT diam:     2.10 cm      3D Volume EF: LV SV:         76           3D EF:        45 % LV SV Index:   39           LV EDV:       139 ml LVOT Area:     3.46 cm     LV ESV:       76 ml                             LV SV:        63 ml  LV Volumes (MOD) LV vol d, MOD A2C: 113.0 ml LV vol d, MOD A4C: 111.0 ml LV vol s, MOD A2C: 67.2 ml LV vol s, MOD A4C: 60.7 ml LV SV MOD A2C:     45.8 ml LV SV MOD A4C:     111.0 ml LV SV MOD BP:      48.2 ml IVC IVC diam: 3.10 cm LEFT ATRIUM         Index LA diam:    4.80 cm 2.47 cm/m  AORTIC VALVE AV Area (Vmax):    3.23 cm AV Area (Vmean):   2.88 cm AV Area (VTI):     3.03 cm AV Vmax:           139.50 cm/s AV Vmean:          96.950 cm/s AV VTI:            0.249 m AV Peak Grad:      7.8 mmHg AV Mean Grad:      4.5 mmHg  LVOT Vmax:         130.00 cm/s LVOT Vmean:        80.700 cm/s LVOT VTI:          0.218 m LVOT/AV VTI ratio: 0.88 AI PHT:            388 msec  AORTA Ao Root diam: 3.20 cm Ao Asc diam:  3.50 cm MITRAL VALVE                  TRICUSPID VALVE MV Area (PHT): 3.60 cm       TR Peak grad:   37.2 mmHg MV Area VTI:   1.73 cm       TR Vmax:        305.00 cm/s MV Peak grad:  11.6 mmHg MV Mean grad:  5.0 mmHg       SHUNTS MV Vmax:       1.70 m/s       Systemic VTI:  0.22 m MV Vmean:      102.0 cm/s     Systemic Diam: 2.10 cm MV Decel Time: 211 msec MR Peak grad:    98.0 mmHg MR Mean grad:    62.0 mmHg MR Vmax:         495.00 cm/s MR Vmean:        367.0 cm/s MR PISA:         3.08 cm MR PISA Eff ROA: 24 mm MR PISA Radius:  0.70 cm MV E velocity: 160.00 cm/s MV A velocity: 128.00 cm/s MV  E/A ratio:  1.25 Jenkins Rouge MD Electronically signed by Jenkins Rouge MD Signature Date/Time: 04/19/2021/4:47:16 PM    Final      Subjective:  No significant events overnight as discussed with staff, patient denies any complaints. Discharge Exam: Vitals:   04/28/21 0330 04/28/21 0809  BP: (!) 113/58 106/63  Pulse: (!) 56 96  Resp: 17 18  Temp: 98.1 F (36.7 C) 98.1 F (36.7 C)  SpO2: 93% 95%   Vitals:   04/27/21 2334 04/28/21 0330 04/28/21 0500 04/28/21 0809  BP: (!) 112/59 (!) 113/58  106/63  Pulse: 88 (!) 56  96  Resp: 16 17  18   Temp: 98.3 F (36.8 C) 98.1 F (36.7 C)  98.1 F (36.7 C)  TempSrc: Axillary Axillary  Oral  SpO2: 93% 93%  95%  Weight:   80.4 kg   Height:        General: Pt is alert, awake, not in acute distress, frail Cardiovascular: RRR, S1/S2 +, no rubs, no gallops Respiratory: CTA bilaterally, no wheezing, no rhonchi Abdominal: Soft, NT, ND, bowel sounds + Extremities: +1 edema, no cyanosis    The results of significant diagnostics from this hospitalization (including imaging, microbiology, ancillary and laboratory) are listed below for reference.     Microbiology: No results found for this or any previous visit (from the past 240 hour(s)).   Labs: BNP (last 3 results) Recent Labs    10/29/20 1257 02/26/21 1002 04/19/21 0214  BNP 233.9* 1,279* 494.4*   Basic Metabolic Panel: Recent Labs  Lab 04/22/21 0128 04/23/21 0040 04/24/21 0052 04/25/21 0103 04/27/21 1414  NA 132* 134* 132* 132* 132*  K 5.1 4.6 4.6 4.3 5.1  CL 103 105 103 102 102  CO2 19* 21* 19* 21* 20*  GLUCOSE 133* 140* 203* 180* 117*  BUN 63* 59* 55* 52* 41*  CREATININE 2.42* 2.23* 2.09* 1.83* 1.59*  CALCIUM 7.9* 7.8* 7.9* 7.9* 8.1*  PHOS  4.0 3.7 3.1 2.6  --    Liver Function Tests: Recent Labs  Lab 04/22/21 0128 04/23/21 0040 04/24/21 0052 04/25/21 0103  ALBUMIN 2.5* 2.3* 2.3* 2.2*   No results for input(s): LIPASE, AMYLASE in the last 168 hours. No results  for input(s): AMMONIA in the last 168 hours. CBC: Recent Labs  Lab 04/23/21 0040 04/25/21 0103 04/27/21 1414  WBC 6.8 7.0 10.0  HGB 12.9* 12.8* 15.4  HCT 36.4* 37.4* 46.3  MCV 85.2 86.0 88.4  PLT 383 424* 499*   Cardiac Enzymes: No results for input(s): CKTOTAL, CKMB, CKMBINDEX, TROPONINI in the last 168 hours. BNP: Invalid input(s): POCBNP CBG: Recent Labs  Lab 04/27/21 1259 04/27/21 1327 04/27/21 1343 04/27/21 2056 04/28/21 0818  GLUCAP 69* 42* 33* 94 77   D-Dimer No results for input(s): DDIMER in the last 72 hours. Hgb A1c No results for input(s): HGBA1C in the last 72 hours. Lipid Profile No results for input(s): CHOL, HDL, LDLCALC, TRIG, CHOLHDL, LDLDIRECT in the last 72 hours. Thyroid function studies No results for input(s): TSH, T4TOTAL, T3FREE, THYROIDAB in the last 72 hours.  Invalid input(s): FREET3 Anemia work up No results for input(s): VITAMINB12, FOLATE, FERRITIN, TIBC, IRON, RETICCTPCT in the last 72 hours. Urinalysis    Component Value Date/Time   COLORURINE YELLOW 04/17/2021 0117   APPEARANCEUR CLEAR 04/17/2021 0117   LABSPEC >1.030 (H) 04/17/2021 0117   PHURINE 5.5 04/17/2021 0117   GLUCOSEU NEGATIVE 04/17/2021 0117   HGBUR SMALL (A) 04/17/2021 0117   BILIRUBINUR MODERATE (A) 04/17/2021 0117   KETONESUR NEGATIVE 04/17/2021 0117   PROTEINUR >300 (A) 04/17/2021 0117   NITRITE NEGATIVE 04/17/2021 0117   LEUKOCYTESUR NEGATIVE 04/17/2021 0117   Sepsis Labs Invalid input(s): PROCALCITONIN,  WBC,  LACTICIDVEN Microbiology No results found for this or any previous visit (from the past 240 hour(s)).   Time coordinating discharge: Over 30 minutes  SIGNED:   Phillips Climes, MD  [facility] 04/28/2021, 10:48 AM Pager   If 7PM-7AM, please contact night-coverage [email or web address] Password [email or web address]

## 2021-04-28 NOTE — TOC Transition Note (Addendum)
Transition of Care [facility]) - CM/SW Discharge Note   Patient Details  Name: Birt Reinoso MRN: [id number] Date of Birth: Sep 29, 1930  Transition of Care [facility]) CM/SW Contact:  Elliot Gurney [place], [place] Phone Number: [phone] 04/28/2021, 10:43 AM   Clinical Narrative:    Phone call received from [facility], authorization received. Patient will be going to room 1008. Room will be ready at 2pm today and will be transported by [facility]. Discharge summary to be sent to the facility before patient's arrival.  12:30pm RN to call report to [place], LCSW Transition of Care [phone]      Barriers to Discharge: SNF Covid   Patient Goals and CMS Choice Patient states their goals for this hospitalization and ongoing recovery are:: Rehab CMS Medicare.gov Compare Post Acute Care list provided to:: Patient Represenative (must comment) Choice offered to / list presented to : Adult Children  Discharge Placement                       Discharge Plan and Services In-house Referral: Clinical Social Work   Post Acute Care Choice: [place]                               Social Determinants of Health (SDOH) Interventions     Readmission Risk Interventions No flowsheet data found.

## 2021-04-28 NOTE — TOC Transition Note (Addendum)
Transition of Care [facility]) - CM/SW Discharge Note   Patient Details  Name: Kevin Schwartz MRN: [id number] Date of Birth: 1930-11-24  Transition of Care [facility]) CM/SW Contact:  Kevin Schwartz, [place] Phone Number: 04/28/2021, 12:32 PM   Clinical Narrative:    Patient to be discharged to [facility]. [facility] to provide transport. Patient will be going to room 1008. Report to be called in to [phone]. Discharge summary set to the facility using the HUB.  1:56pm Patient's son Kevin Schwartz contacted and made aware of discharge today to [facility], [place] Transition of Care [phone]      Barriers to Discharge: SNF Covid   Patient Goals and CMS Choice Patient states their goals for this hospitalization and ongoing recovery are:: Rehab CMS Medicare.gov Compare Post Acute Care list provided to:: Patient Represenative (must comment) Choice offered to / list presented to : Adult Children  Discharge Placement                       Discharge Plan and Services In-house Referral: Clinical Social Work   Post Acute Care Choice: [place]                               Social Determinants of Health (SDOH) Interventions     Readmission Risk Interventions No flowsheet data found.

## 2021-04-29 ENCOUNTER — Telehealth: Payer: Self-pay | Admitting: *Deleted

## 2021-04-29 NOTE — Telephone Encounter (Signed)
[name] called and just wanted to let you know that patient was in the hospital for 10 days and now has been discharged to [facility].   I instructed him to give our office a call once patient was discharged for follow up and he stated that he is hoping that this will become a permanent place for patient.   FYI

## 2021-04-30 DIAGNOSIS — J1282 Pneumonia due to coronavirus disease 2019: Secondary | ICD-10-CM | POA: Diagnosis not present

## 2021-04-30 DIAGNOSIS — I5022 Chronic systolic (congestive) heart failure: Secondary | ICD-10-CM | POA: Diagnosis not present

## 2021-04-30 DIAGNOSIS — I340 Nonrheumatic mitral (valve) insufficiency: Secondary | ICD-10-CM | POA: Diagnosis not present

## 2021-04-30 DIAGNOSIS — J9601 Acute respiratory failure with hypoxia: Secondary | ICD-10-CM | POA: Diagnosis not present

## 2021-04-30 DIAGNOSIS — N179 Acute kidney failure, unspecified: Secondary | ICD-10-CM | POA: Diagnosis not present

## 2021-04-30 DIAGNOSIS — R6520 Severe sepsis without septic shock: Secondary | ICD-10-CM | POA: Diagnosis not present

## 2021-04-30 DIAGNOSIS — U071 COVID-19: Secondary | ICD-10-CM | POA: Diagnosis not present

## 2021-04-30 DIAGNOSIS — N183 Chronic kidney disease, stage 3 unspecified: Secondary | ICD-10-CM | POA: Diagnosis not present

## 2021-04-30 DIAGNOSIS — E1122 Type 2 diabetes mellitus with diabetic chronic kidney disease: Secondary | ICD-10-CM | POA: Diagnosis not present

## 2021-05-01 ENCOUNTER — Telehealth: Payer: Self-pay | Admitting: *Deleted

## 2021-05-01 DIAGNOSIS — I10 Essential (primary) hypertension: Secondary | ICD-10-CM | POA: Diagnosis not present

## 2021-05-01 NOTE — Telephone Encounter (Signed)
Transition Care Management Unsuccessful Follow-up Telephone Call  Date of discharge and from where:  04/28/2021 [place]  Attempts:  1st Attempt  Reason for unsuccessful TCM follow-up call:  Unable to reach patient Spoke with son, Kevin Schwartz yesterday and patient is at [facility]. Kevin Schwartz stated that he is hoping this will be permanent

## 2021-05-03 ENCOUNTER — Other Ambulatory Visit: Payer: Self-pay | Admitting: Family

## 2021-05-03 DIAGNOSIS — R600 Localized edema: Secondary | ICD-10-CM

## 2021-05-03 DIAGNOSIS — I509 Heart failure, unspecified: Secondary | ICD-10-CM

## 2021-05-09 DIAGNOSIS — I10 Essential (primary) hypertension: Secondary | ICD-10-CM | POA: Diagnosis not present

## 2021-05-09 DIAGNOSIS — F32A Depression, unspecified: Secondary | ICD-10-CM | POA: Diagnosis not present

## 2021-05-09 DIAGNOSIS — I509 Heart failure, unspecified: Secondary | ICD-10-CM | POA: Diagnosis not present

## 2021-05-09 DIAGNOSIS — N183 Chronic kidney disease, stage 3 unspecified: Secondary | ICD-10-CM | POA: Diagnosis not present

## 2021-05-13 DIAGNOSIS — A419 Sepsis, unspecified organism: Secondary | ICD-10-CM | POA: Diagnosis not present

## 2021-05-13 DIAGNOSIS — M6281 Muscle weakness (generalized): Secondary | ICD-10-CM | POA: Diagnosis not present

## 2021-05-13 DIAGNOSIS — I4891 Unspecified atrial fibrillation: Secondary | ICD-10-CM | POA: Diagnosis not present

## 2021-05-13 DIAGNOSIS — G9340 Encephalopathy, unspecified: Secondary | ICD-10-CM | POA: Diagnosis not present

## 2021-05-13 DIAGNOSIS — U099 Post covid-19 condition, unspecified: Secondary | ICD-10-CM | POA: Diagnosis not present

## 2021-05-13 DIAGNOSIS — E119 Type 2 diabetes mellitus without complications: Secondary | ICD-10-CM | POA: Diagnosis not present

## 2021-05-13 DIAGNOSIS — I5042 Chronic combined systolic (congestive) and diastolic (congestive) heart failure: Secondary | ICD-10-CM | POA: Diagnosis not present

## 2021-05-13 DIAGNOSIS — Z7901 Long term (current) use of anticoagulants: Secondary | ICD-10-CM | POA: Diagnosis not present

## 2021-05-15 DIAGNOSIS — A419 Sepsis, unspecified organism: Secondary | ICD-10-CM | POA: Diagnosis not present

## 2021-05-15 DIAGNOSIS — R0602 Shortness of breath: Secondary | ICD-10-CM | POA: Diagnosis not present

## 2021-05-15 DIAGNOSIS — I4891 Unspecified atrial fibrillation: Secondary | ICD-10-CM | POA: Diagnosis not present

## 2021-05-15 DIAGNOSIS — I5042 Chronic combined systolic (congestive) and diastolic (congestive) heart failure: Secondary | ICD-10-CM | POA: Diagnosis not present

## 2021-05-15 DIAGNOSIS — M6281 Muscle weakness (generalized): Secondary | ICD-10-CM | POA: Diagnosis not present

## 2021-05-15 DIAGNOSIS — E119 Type 2 diabetes mellitus without complications: Secondary | ICD-10-CM | POA: Diagnosis not present

## 2021-05-15 DIAGNOSIS — R042 Hemoptysis: Secondary | ICD-10-CM | POA: Diagnosis not present

## 2021-05-15 DIAGNOSIS — G9340 Encephalopathy, unspecified: Secondary | ICD-10-CM | POA: Diagnosis not present

## 2021-05-15 DIAGNOSIS — U099 Post covid-19 condition, unspecified: Secondary | ICD-10-CM | POA: Diagnosis not present

## 2021-05-17 DIAGNOSIS — Z029 Encounter for administrative examinations, unspecified: Secondary | ICD-10-CM | POA: Diagnosis not present

## 2021-05-17 DIAGNOSIS — R0989 Other specified symptoms and signs involving the circulatory and respiratory systems: Secondary | ICD-10-CM | POA: Diagnosis not present

## 2021-05-17 DIAGNOSIS — I5022 Chronic systolic (congestive) heart failure: Secondary | ICD-10-CM | POA: Diagnosis not present

## 2021-05-20 DIAGNOSIS — G9340 Encephalopathy, unspecified: Secondary | ICD-10-CM | POA: Diagnosis not present

## 2021-05-20 DIAGNOSIS — E119 Type 2 diabetes mellitus without complications: Secondary | ICD-10-CM | POA: Diagnosis not present

## 2021-05-20 DIAGNOSIS — R042 Hemoptysis: Secondary | ICD-10-CM | POA: Diagnosis not present

## 2021-05-20 DIAGNOSIS — M6281 Muscle weakness (generalized): Secondary | ICD-10-CM | POA: Diagnosis not present

## 2021-05-20 DIAGNOSIS — U099 Post covid-19 condition, unspecified: Secondary | ICD-10-CM | POA: Diagnosis not present

## 2021-05-20 DIAGNOSIS — R0602 Shortness of breath: Secondary | ICD-10-CM | POA: Diagnosis not present

## 2021-05-20 DIAGNOSIS — A419 Sepsis, unspecified organism: Secondary | ICD-10-CM | POA: Diagnosis not present

## 2021-05-20 DIAGNOSIS — I4891 Unspecified atrial fibrillation: Secondary | ICD-10-CM | POA: Diagnosis not present

## 2021-05-20 DIAGNOSIS — I5042 Chronic combined systolic (congestive) and diastolic (congestive) heart failure: Secondary | ICD-10-CM | POA: Diagnosis not present

## 2021-05-21 DIAGNOSIS — R042 Hemoptysis: Secondary | ICD-10-CM | POA: Diagnosis not present

## 2021-05-21 DIAGNOSIS — J1282 Pneumonia due to coronavirus disease 2019: Secondary | ICD-10-CM | POA: Diagnosis not present

## 2021-05-21 DIAGNOSIS — U071 COVID-19: Secondary | ICD-10-CM | POA: Diagnosis not present

## 2021-05-21 DIAGNOSIS — N179 Acute kidney failure, unspecified: Secondary | ICD-10-CM | POA: Diagnosis not present

## 2021-05-21 DIAGNOSIS — I10 Essential (primary) hypertension: Secondary | ICD-10-CM | POA: Diagnosis not present

## 2021-05-21 DIAGNOSIS — I5022 Chronic systolic (congestive) heart failure: Secondary | ICD-10-CM | POA: Diagnosis not present

## 2021-05-22 DIAGNOSIS — U099 Post covid-19 condition, unspecified: Secondary | ICD-10-CM | POA: Diagnosis not present

## 2021-05-22 DIAGNOSIS — A419 Sepsis, unspecified organism: Secondary | ICD-10-CM | POA: Diagnosis not present

## 2021-05-22 DIAGNOSIS — G9340 Encephalopathy, unspecified: Secondary | ICD-10-CM | POA: Diagnosis not present

## 2021-05-22 DIAGNOSIS — R0602 Shortness of breath: Secondary | ICD-10-CM | POA: Diagnosis not present

## 2021-05-22 DIAGNOSIS — R042 Hemoptysis: Secondary | ICD-10-CM | POA: Diagnosis not present

## 2021-05-22 DIAGNOSIS — I4891 Unspecified atrial fibrillation: Secondary | ICD-10-CM | POA: Diagnosis not present

## 2021-05-22 DIAGNOSIS — I5042 Chronic combined systolic (congestive) and diastolic (congestive) heart failure: Secondary | ICD-10-CM | POA: Diagnosis not present

## 2021-05-22 DIAGNOSIS — E119 Type 2 diabetes mellitus without complications: Secondary | ICD-10-CM | POA: Diagnosis not present

## 2021-05-22 DIAGNOSIS — M6281 Muscle weakness (generalized): Secondary | ICD-10-CM | POA: Diagnosis not present

## 2021-05-23 DIAGNOSIS — I509 Heart failure, unspecified: Secondary | ICD-10-CM | POA: Diagnosis not present

## 2021-05-23 DIAGNOSIS — N183 Chronic kidney disease, stage 3 unspecified: Secondary | ICD-10-CM | POA: Diagnosis not present

## 2021-05-23 DIAGNOSIS — F32A Depression, unspecified: Secondary | ICD-10-CM | POA: Diagnosis not present

## 2021-05-23 DIAGNOSIS — I10 Essential (primary) hypertension: Secondary | ICD-10-CM | POA: Diagnosis not present

## 2021-05-27 DIAGNOSIS — R042 Hemoptysis: Secondary | ICD-10-CM | POA: Diagnosis not present

## 2021-05-27 DIAGNOSIS — R0602 Shortness of breath: Secondary | ICD-10-CM | POA: Diagnosis not present

## 2021-05-27 DIAGNOSIS — M6281 Muscle weakness (generalized): Secondary | ICD-10-CM | POA: Diagnosis not present

## 2021-05-27 DIAGNOSIS — A419 Sepsis, unspecified organism: Secondary | ICD-10-CM | POA: Diagnosis not present

## 2021-05-27 DIAGNOSIS — I5042 Chronic combined systolic (congestive) and diastolic (congestive) heart failure: Secondary | ICD-10-CM | POA: Diagnosis not present

## 2021-05-27 DIAGNOSIS — E119 Type 2 diabetes mellitus without complications: Secondary | ICD-10-CM | POA: Diagnosis not present

## 2021-05-27 DIAGNOSIS — R601 Generalized edema: Secondary | ICD-10-CM | POA: Diagnosis not present

## 2021-05-27 DIAGNOSIS — G9340 Encephalopathy, unspecified: Secondary | ICD-10-CM | POA: Diagnosis not present

## 2021-05-27 DIAGNOSIS — U099 Post covid-19 condition, unspecified: Secondary | ICD-10-CM | POA: Diagnosis not present

## 2021-05-27 DIAGNOSIS — I4891 Unspecified atrial fibrillation: Secondary | ICD-10-CM | POA: Diagnosis not present

## 2021-05-29 DIAGNOSIS — U099 Post covid-19 condition, unspecified: Secondary | ICD-10-CM | POA: Diagnosis not present

## 2021-05-29 DIAGNOSIS — I5042 Chronic combined systolic (congestive) and diastolic (congestive) heart failure: Secondary | ICD-10-CM | POA: Diagnosis not present

## 2021-05-29 DIAGNOSIS — A419 Sepsis, unspecified organism: Secondary | ICD-10-CM | POA: Diagnosis not present

## 2021-05-29 DIAGNOSIS — M6281 Muscle weakness (generalized): Secondary | ICD-10-CM | POA: Diagnosis not present

## 2021-05-29 DIAGNOSIS — R042 Hemoptysis: Secondary | ICD-10-CM | POA: Diagnosis not present

## 2021-05-29 DIAGNOSIS — R0602 Shortness of breath: Secondary | ICD-10-CM | POA: Diagnosis not present

## 2021-05-29 DIAGNOSIS — E119 Type 2 diabetes mellitus without complications: Secondary | ICD-10-CM | POA: Diagnosis not present

## 2021-05-29 DIAGNOSIS — I4891 Unspecified atrial fibrillation: Secondary | ICD-10-CM | POA: Diagnosis not present

## 2021-05-29 DIAGNOSIS — G9340 Encephalopathy, unspecified: Secondary | ICD-10-CM | POA: Diagnosis not present

## 2021-06-03 DIAGNOSIS — A419 Sepsis, unspecified organism: Secondary | ICD-10-CM | POA: Diagnosis not present

## 2021-06-03 DIAGNOSIS — E119 Type 2 diabetes mellitus without complications: Secondary | ICD-10-CM | POA: Diagnosis not present

## 2021-06-03 DIAGNOSIS — R042 Hemoptysis: Secondary | ICD-10-CM | POA: Diagnosis not present

## 2021-06-03 DIAGNOSIS — G9340 Encephalopathy, unspecified: Secondary | ICD-10-CM | POA: Diagnosis not present

## 2021-06-03 DIAGNOSIS — I5042 Chronic combined systolic (congestive) and diastolic (congestive) heart failure: Secondary | ICD-10-CM | POA: Diagnosis not present

## 2021-06-03 DIAGNOSIS — I4891 Unspecified atrial fibrillation: Secondary | ICD-10-CM | POA: Diagnosis not present

## 2021-06-03 DIAGNOSIS — R0602 Shortness of breath: Secondary | ICD-10-CM | POA: Diagnosis not present

## 2021-06-03 DIAGNOSIS — M6281 Muscle weakness (generalized): Secondary | ICD-10-CM | POA: Diagnosis not present

## 2021-06-03 DIAGNOSIS — U099 Post covid-19 condition, unspecified: Secondary | ICD-10-CM | POA: Diagnosis not present

## 2021-06-05 DIAGNOSIS — A419 Sepsis, unspecified organism: Secondary | ICD-10-CM | POA: Diagnosis not present

## 2021-06-05 DIAGNOSIS — I5042 Chronic combined systolic (congestive) and diastolic (congestive) heart failure: Secondary | ICD-10-CM | POA: Diagnosis not present

## 2021-06-05 DIAGNOSIS — R0602 Shortness of breath: Secondary | ICD-10-CM | POA: Diagnosis not present

## 2021-06-05 DIAGNOSIS — M6281 Muscle weakness (generalized): Secondary | ICD-10-CM | POA: Diagnosis not present

## 2021-06-05 DIAGNOSIS — U099 Post covid-19 condition, unspecified: Secondary | ICD-10-CM | POA: Diagnosis not present

## 2021-06-05 DIAGNOSIS — I4891 Unspecified atrial fibrillation: Secondary | ICD-10-CM | POA: Diagnosis not present

## 2021-06-05 DIAGNOSIS — G9340 Encephalopathy, unspecified: Secondary | ICD-10-CM | POA: Diagnosis not present

## 2021-06-05 DIAGNOSIS — R042 Hemoptysis: Secondary | ICD-10-CM | POA: Diagnosis not present

## 2021-06-05 DIAGNOSIS — E119 Type 2 diabetes mellitus without complications: Secondary | ICD-10-CM | POA: Diagnosis not present

## 2021-06-06 ENCOUNTER — Other Ambulatory Visit: Payer: Self-pay

## 2021-06-06 ENCOUNTER — Encounter: Payer: Self-pay | Admitting: Family

## 2021-06-06 ENCOUNTER — Encounter: Payer: Medicare HMO | Admitting: Family

## 2021-06-06 NOTE — Progress Notes (Signed)
°   This encounter was created in error - please disregard. Patient admitted in the Hospital unable to complete visit. Will need to reschedule for AWV after discharge.

## 2021-06-10 DIAGNOSIS — R042 Hemoptysis: Secondary | ICD-10-CM | POA: Diagnosis not present

## 2021-06-10 DIAGNOSIS — I5042 Chronic combined systolic (congestive) and diastolic (congestive) heart failure: Secondary | ICD-10-CM | POA: Diagnosis not present

## 2021-06-10 DIAGNOSIS — M6281 Muscle weakness (generalized): Secondary | ICD-10-CM | POA: Diagnosis not present

## 2021-06-10 DIAGNOSIS — I4891 Unspecified atrial fibrillation: Secondary | ICD-10-CM | POA: Diagnosis not present

## 2021-06-10 DIAGNOSIS — A419 Sepsis, unspecified organism: Secondary | ICD-10-CM | POA: Diagnosis not present

## 2021-06-10 DIAGNOSIS — G9340 Encephalopathy, unspecified: Secondary | ICD-10-CM | POA: Diagnosis not present

## 2021-06-10 DIAGNOSIS — R0602 Shortness of breath: Secondary | ICD-10-CM | POA: Diagnosis not present

## 2021-06-10 DIAGNOSIS — U099 Post covid-19 condition, unspecified: Secondary | ICD-10-CM | POA: Diagnosis not present

## 2021-06-10 DIAGNOSIS — E119 Type 2 diabetes mellitus without complications: Secondary | ICD-10-CM | POA: Diagnosis not present

## 2021-06-12 DIAGNOSIS — I4891 Unspecified atrial fibrillation: Secondary | ICD-10-CM | POA: Diagnosis not present

## 2021-06-12 DIAGNOSIS — A419 Sepsis, unspecified organism: Secondary | ICD-10-CM | POA: Diagnosis not present

## 2021-06-12 DIAGNOSIS — U099 Post covid-19 condition, unspecified: Secondary | ICD-10-CM | POA: Diagnosis not present

## 2021-06-12 DIAGNOSIS — I5042 Chronic combined systolic (congestive) and diastolic (congestive) heart failure: Secondary | ICD-10-CM | POA: Diagnosis not present

## 2021-06-12 DIAGNOSIS — G9340 Encephalopathy, unspecified: Secondary | ICD-10-CM | POA: Diagnosis not present

## 2021-06-12 DIAGNOSIS — R0602 Shortness of breath: Secondary | ICD-10-CM | POA: Diagnosis not present

## 2021-06-12 DIAGNOSIS — M6281 Muscle weakness (generalized): Secondary | ICD-10-CM | POA: Diagnosis not present

## 2021-06-12 DIAGNOSIS — E119 Type 2 diabetes mellitus without complications: Secondary | ICD-10-CM | POA: Diagnosis not present

## 2021-06-12 DIAGNOSIS — R042 Hemoptysis: Secondary | ICD-10-CM | POA: Diagnosis not present

## 2021-06-18 DIAGNOSIS — I5042 Chronic combined systolic (congestive) and diastolic (congestive) heart failure: Secondary | ICD-10-CM | POA: Diagnosis not present

## 2021-06-18 DIAGNOSIS — I4891 Unspecified atrial fibrillation: Secondary | ICD-10-CM | POA: Diagnosis not present

## 2021-06-18 DIAGNOSIS — A419 Sepsis, unspecified organism: Secondary | ICD-10-CM | POA: Diagnosis not present

## 2021-06-18 DIAGNOSIS — U099 Post covid-19 condition, unspecified: Secondary | ICD-10-CM | POA: Diagnosis not present

## 2021-06-18 DIAGNOSIS — E119 Type 2 diabetes mellitus without complications: Secondary | ICD-10-CM | POA: Diagnosis not present

## 2021-06-18 DIAGNOSIS — I5022 Chronic systolic (congestive) heart failure: Secondary | ICD-10-CM | POA: Diagnosis not present

## 2021-06-18 DIAGNOSIS — R0602 Shortness of breath: Secondary | ICD-10-CM | POA: Diagnosis not present

## 2021-06-18 DIAGNOSIS — I152 Hypertension secondary to endocrine disorders: Secondary | ICD-10-CM | POA: Diagnosis not present

## 2021-06-18 DIAGNOSIS — J984 Other disorders of lung: Secondary | ICD-10-CM | POA: Diagnosis not present

## 2021-06-18 DIAGNOSIS — G9340 Encephalopathy, unspecified: Secondary | ICD-10-CM | POA: Diagnosis not present

## 2021-06-18 DIAGNOSIS — R042 Hemoptysis: Secondary | ICD-10-CM | POA: Diagnosis not present

## 2021-06-18 DIAGNOSIS — M6281 Muscle weakness (generalized): Secondary | ICD-10-CM | POA: Diagnosis not present

## 2021-06-18 DIAGNOSIS — I340 Nonrheumatic mitral (valve) insufficiency: Secondary | ICD-10-CM | POA: Diagnosis not present

## 2021-06-18 DIAGNOSIS — I2510 Atherosclerotic heart disease of native coronary artery without angina pectoris: Secondary | ICD-10-CM | POA: Diagnosis not present

## 2021-06-18 DIAGNOSIS — N183 Chronic kidney disease, stage 3 unspecified: Secondary | ICD-10-CM | POA: Diagnosis not present

## 2021-06-18 DIAGNOSIS — I739 Peripheral vascular disease, unspecified: Secondary | ICD-10-CM | POA: Diagnosis not present

## 2021-06-18 DIAGNOSIS — I509 Heart failure, unspecified: Secondary | ICD-10-CM | POA: Diagnosis not present

## 2021-06-18 DIAGNOSIS — E1159 Type 2 diabetes mellitus with other circulatory complications: Secondary | ICD-10-CM | POA: Diagnosis not present

## 2021-06-20 DIAGNOSIS — I509 Heart failure, unspecified: Secondary | ICD-10-CM | POA: Diagnosis not present

## 2021-06-20 DIAGNOSIS — R079 Chest pain, unspecified: Secondary | ICD-10-CM | POA: Diagnosis not present

## 2021-06-20 DIAGNOSIS — R042 Hemoptysis: Secondary | ICD-10-CM | POA: Diagnosis not present

## 2021-06-20 DIAGNOSIS — R0602 Shortness of breath: Secondary | ICD-10-CM | POA: Diagnosis not present

## 2021-06-20 DIAGNOSIS — U099 Post covid-19 condition, unspecified: Secondary | ICD-10-CM | POA: Diagnosis not present

## 2021-06-20 DIAGNOSIS — E119 Type 2 diabetes mellitus without complications: Secondary | ICD-10-CM | POA: Diagnosis not present

## 2021-06-20 DIAGNOSIS — G9340 Encephalopathy, unspecified: Secondary | ICD-10-CM | POA: Diagnosis not present

## 2021-06-20 DIAGNOSIS — I4891 Unspecified atrial fibrillation: Secondary | ICD-10-CM | POA: Diagnosis not present

## 2021-06-20 DIAGNOSIS — M6281 Muscle weakness (generalized): Secondary | ICD-10-CM | POA: Diagnosis not present

## 2021-06-20 DIAGNOSIS — I10 Essential (primary) hypertension: Secondary | ICD-10-CM | POA: Diagnosis not present

## 2021-06-20 DIAGNOSIS — R0902 Hypoxemia: Secondary | ICD-10-CM | POA: Diagnosis not present

## 2021-06-20 DIAGNOSIS — A419 Sepsis, unspecified organism: Secondary | ICD-10-CM | POA: Diagnosis not present

## 2021-06-20 DIAGNOSIS — I5042 Chronic combined systolic (congestive) and diastolic (congestive) heart failure: Secondary | ICD-10-CM | POA: Diagnosis not present

## 2021-06-21 DIAGNOSIS — I509 Heart failure, unspecified: Secondary | ICD-10-CM | POA: Diagnosis not present

## 2021-06-21 DIAGNOSIS — J984 Other disorders of lung: Secondary | ICD-10-CM | POA: Diagnosis not present

## 2021-06-21 DIAGNOSIS — R079 Chest pain, unspecified: Secondary | ICD-10-CM | POA: Diagnosis not present

## 2021-06-21 DIAGNOSIS — I4892 Unspecified atrial flutter: Secondary | ICD-10-CM | POA: Diagnosis not present

## 2021-06-21 DIAGNOSIS — I4891 Unspecified atrial fibrillation: Secondary | ICD-10-CM | POA: Diagnosis not present

## 2021-06-21 DIAGNOSIS — R0902 Hypoxemia: Secondary | ICD-10-CM | POA: Diagnosis not present

## 2021-06-24 DIAGNOSIS — U099 Post covid-19 condition, unspecified: Secondary | ICD-10-CM | POA: Diagnosis not present

## 2021-06-24 DIAGNOSIS — I5042 Chronic combined systolic (congestive) and diastolic (congestive) heart failure: Secondary | ICD-10-CM | POA: Diagnosis not present

## 2021-06-24 DIAGNOSIS — G9340 Encephalopathy, unspecified: Secondary | ICD-10-CM | POA: Diagnosis not present

## 2021-06-24 DIAGNOSIS — A419 Sepsis, unspecified organism: Secondary | ICD-10-CM | POA: Diagnosis not present

## 2021-06-24 DIAGNOSIS — I4891 Unspecified atrial fibrillation: Secondary | ICD-10-CM | POA: Diagnosis not present

## 2021-06-24 DIAGNOSIS — E119 Type 2 diabetes mellitus without complications: Secondary | ICD-10-CM | POA: Diagnosis not present

## 2021-06-24 DIAGNOSIS — R042 Hemoptysis: Secondary | ICD-10-CM | POA: Diagnosis not present

## 2021-06-24 DIAGNOSIS — M6281 Muscle weakness (generalized): Secondary | ICD-10-CM | POA: Diagnosis not present

## 2021-06-24 DIAGNOSIS — R0602 Shortness of breath: Secondary | ICD-10-CM | POA: Diagnosis not present

## 2021-06-26 DIAGNOSIS — A419 Sepsis, unspecified organism: Secondary | ICD-10-CM | POA: Diagnosis not present

## 2021-06-26 DIAGNOSIS — I4891 Unspecified atrial fibrillation: Secondary | ICD-10-CM | POA: Diagnosis not present

## 2021-06-26 DIAGNOSIS — E119 Type 2 diabetes mellitus without complications: Secondary | ICD-10-CM | POA: Diagnosis not present

## 2021-06-26 DIAGNOSIS — R042 Hemoptysis: Secondary | ICD-10-CM | POA: Diagnosis not present

## 2021-06-26 DIAGNOSIS — R0602 Shortness of breath: Secondary | ICD-10-CM | POA: Diagnosis not present

## 2021-06-26 DIAGNOSIS — U099 Post covid-19 condition, unspecified: Secondary | ICD-10-CM | POA: Diagnosis not present

## 2021-06-26 DIAGNOSIS — M6281 Muscle weakness (generalized): Secondary | ICD-10-CM | POA: Diagnosis not present

## 2021-06-26 DIAGNOSIS — G9340 Encephalopathy, unspecified: Secondary | ICD-10-CM | POA: Diagnosis not present

## 2021-06-26 DIAGNOSIS — I5042 Chronic combined systolic (congestive) and diastolic (congestive) heart failure: Secondary | ICD-10-CM | POA: Diagnosis not present

## 2021-07-01 DIAGNOSIS — A419 Sepsis, unspecified organism: Secondary | ICD-10-CM | POA: Diagnosis not present

## 2021-07-01 DIAGNOSIS — I4891 Unspecified atrial fibrillation: Secondary | ICD-10-CM | POA: Diagnosis not present

## 2021-07-01 DIAGNOSIS — M6281 Muscle weakness (generalized): Secondary | ICD-10-CM | POA: Diagnosis not present

## 2021-07-01 DIAGNOSIS — U099 Post covid-19 condition, unspecified: Secondary | ICD-10-CM | POA: Diagnosis not present

## 2021-07-01 DIAGNOSIS — G9340 Encephalopathy, unspecified: Secondary | ICD-10-CM | POA: Diagnosis not present

## 2021-07-01 DIAGNOSIS — R0602 Shortness of breath: Secondary | ICD-10-CM | POA: Diagnosis not present

## 2021-07-01 DIAGNOSIS — I5042 Chronic combined systolic (congestive) and diastolic (congestive) heart failure: Secondary | ICD-10-CM | POA: Diagnosis not present

## 2021-07-01 DIAGNOSIS — R042 Hemoptysis: Secondary | ICD-10-CM | POA: Diagnosis not present

## 2021-07-01 DIAGNOSIS — E119 Type 2 diabetes mellitus without complications: Secondary | ICD-10-CM | POA: Diagnosis not present

## 2021-07-02 ENCOUNTER — Emergency Department (HOSPITAL_COMMUNITY)
Admission: EM | Admit: 2021-07-02 | Discharge: 2021-07-02 | Disposition: A | Discharge status: Skilled Nursing Facility | Payer: Medicare HMO | Attending: Emergency Medicine | Admitting: Emergency Medicine

## 2021-07-02 ENCOUNTER — Encounter (HOSPITAL_COMMUNITY): Payer: Self-pay | Admitting: Emergency Medicine

## 2021-07-02 ENCOUNTER — Other Ambulatory Visit: Payer: Self-pay

## 2021-07-02 DIAGNOSIS — Z7901 Long term (current) use of anticoagulants: Secondary | ICD-10-CM | POA: Diagnosis not present

## 2021-07-02 DIAGNOSIS — R040 Epistaxis: Secondary | ICD-10-CM

## 2021-07-02 DIAGNOSIS — D649 Anemia, unspecified: Secondary | ICD-10-CM | POA: Diagnosis not present

## 2021-07-02 DIAGNOSIS — L988 Other specified disorders of the skin and subcutaneous tissue: Secondary | ICD-10-CM | POA: Diagnosis not present

## 2021-07-02 DIAGNOSIS — D62 Acute posthemorrhagic anemia: Secondary | ICD-10-CM | POA: Diagnosis not present

## 2021-07-02 DIAGNOSIS — L97412 Non-pressure chronic ulcer of right heel and midfoot with fat layer exposed: Secondary | ICD-10-CM | POA: Diagnosis not present

## 2021-07-02 DIAGNOSIS — D500 Iron deficiency anemia secondary to blood loss (chronic): Secondary | ICD-10-CM

## 2021-07-02 LAB — CBC WITH DIFFERENTIAL/PLATELET
Abs Immature Granulocytes: 0.02 10*3/uL (ref 0.00–0.07)
Basophils Absolute: 0 10*3/uL (ref 0.0–0.1)
Basophils Relative: 1 %
Eosinophils Absolute: 0.4 10*3/uL (ref 0.0–0.5)
Eosinophils Relative: 7 %
HCT: 33.4 % — ABNORMAL LOW (ref 39.0–52.0)
Hemoglobin: 10.3 g/dL — ABNORMAL LOW (ref 13.0–17.0)
Immature Granulocytes: 0 %
Lymphocytes Relative: 14 %
Lymphs Abs: 0.7 10*3/uL (ref 0.7–4.0)
MCH: 30.2 pg (ref 26.0–34.0)
MCHC: 30.8 g/dL (ref 30.0–36.0)
MCV: 97.9 fL (ref 80.0–100.0)
Monocytes Absolute: 0.5 10*3/uL (ref 0.1–1.0)
Monocytes Relative: 8 %
Neutro Abs: 3.7 10*3/uL (ref 1.7–7.7)
Neutrophils Relative %: 70 %
Platelets: 265 10*3/uL (ref 150–400)
RBC: 3.41 MIL/uL — ABNORMAL LOW (ref 4.22–5.81)
RDW: 19.4 % — ABNORMAL HIGH (ref 11.5–15.5)
WBC: 5.3 10*3/uL (ref 4.0–10.5)
nRBC: 0 % (ref 0.0–0.2)

## 2021-07-02 MED ORDER — ACETAMINOPHEN 500 MG PO TABS
1000.0000 mg | ORAL_TABLET | Freq: Once | ORAL | Status: AC
  Administered 2021-07-02: 1000 mg via ORAL
  Filled 2021-07-02: qty 2

## 2021-07-02 NOTE — ED Notes (Signed)
Pt verbalizes understanding of discharge instructions. Opportunity for questions and answers were provided. Pt discharged from the ED to [facility] via [facility]. 

## 2021-07-02 NOTE — ED Triage Notes (Signed)
Pt on eliquis with nose bleed for the last 4 days. Pt awake, alert, appropriate at present. Wears 2LNC at baseline., Affrin nose spray PTA. VSS.  ?

## 2021-07-02 NOTE — ED Provider Notes (Signed)
?[place] ?Provider Note ? ? ?CSN: [id number] ?Arrival date & time: 07/02/21  0945 ? ?  ? ?History ? ?Chief Complaint  ?Patient presents with  ? Epistaxis  ? ? ?Kevin Schwartz is a 88 y.o. male. ? ?Patient presents with intermittent nosebleed for 4 days.  Patient denies any lightheadedness, syncope, chest pain, shortness of breath or trauma.  Patient is on Eliquis and Plavix for vascular disease history.  Afrin given prior to arrival, decreased amount of bleeding.  Mostly left nare.  Patient wears 2 L nasal cannula as needed. ? ? ?  ? ?Home Medications ?Prior to Admission medications   ?Medication Sig Start Date End Date Taking? Authorizing Provider  ?ACCU-CHEK SOFTCLIX LANCETS lancets Use to test blood sugar daily. Dx:E11.8 03/25/17   Hollace Kinnier L, DO  ?Alcohol Swabs (B-D SINGLE USE SWABS REGULAR) PADS Use with testing of blood sugar. Dx:E11.8 03/25/17   Reed, Tiffany L, DO  ?allopurinol (ZYLOPRIM) 100 MG tablet Take 1 tablet (100 mg total) by mouth daily. 01/31/21   [name], MD  ?apixaban (ELIQUIS) 2.5 MG TABS tablet Take 1 tablet (2.5 mg total) by mouth 2 (two) times daily. 04/28/21   Elgergawy, Silver Huguenin, MD  ?atorvastatin (LIPITOR) 80 MG tablet Take 1 tablet (80 mg total) by mouth daily. 01/31/21 06/06/21  Ngetich, Dinah C, NP  ?citalopram (CELEXA) 20 MG tablet Take 1 tablet (20 mg total) by mouth daily. 01/31/21   [name], MD  ?clopidogrel (PLAVIX) 75 MG tablet Take 1 tablet (75 mg total) by mouth daily with breakfast. 08/04/20 07/30/21  Kayleen Memos, DO  ?ezetimibe (ZETIA) 10 MG tablet Take 10 mg by mouth daily.    [provider]  ?furosemide (LASIX) 20 MG tablet TAKE 1 TABLET BY MOUTH EVERY DAY 03/20/21   [name], MD  ?glucose blood (ACCU-CHEK AVIVA PLUS) test strip Use to check blood sugar daily. Dx:E11.8 09/05/19   Reed, Tiffany L, DO  ?lidocaine (LIDODERM) 5 % Place 1 patch onto the skin daily. Remove & Discard patch  within 12 hours or as directed by MD 04/01/20   Maudie Flakes, MD  ?nitroGLYCERIN (NITROSTAT) 0.4 MG SL tablet DISSOLVE 1 TABLET UNDER THE TONGUE EVERY 5 MINUTES AS  NEEDED FOR CHEST PAIN. MAX  OF 3 TABLETS IN 15 MINUTES. CALL 911 IF PAIN PERSISTS. 11/08/20   [name], MD  ?Wallene Dales MINI PEN NEEDLE 31G X 5 MM MISC  08/29/14   [provider]  ?   ? ?Allergies    ?Lisinopril, Losartan potassium, Crestor [rosuvastatin], and Tape   ? ?Review of Systems   ?Review of Systems  ?Constitutional:  Negative for chills and fever.  ?HENT:  Negative for congestion.   ?Eyes:  Negative for visual disturbance.  ?Respiratory:  Negative for shortness of breath.   ?Cardiovascular:  Negative for chest pain.  ?Gastrointestinal:  Negative for abdominal pain and vomiting.  ?Genitourinary:  Negative for dysuria and flank pain.  ?Musculoskeletal:  Negative for back pain, neck pain and neck stiffness.  ?Skin:  Negative for rash.  ?Neurological:  Negative for light-headedness and headaches.  ? ?Physical Exam ?Updated Vital Signs ?BP 120/76   Pulse 98   Temp (!) 97.2 ?F (36.2 ?C) (Axillary)   Resp 16   Ht '5\' 7"'$  (1.702 m)   Wt 82.6 kg   SpO2 99%   BMI 28.51 kg/m?  ?Physical Exam ?Vitals and nursing note reviewed.  ?Constitutional:   ?  General: He is not in acute distress. ?   Appearance: He is well-developed.  ?HENT:  ?   Head: Normocephalic and atraumatic.  ?   Comments: Patient has mild bleeding left nare anterior and posterior.  On recheck minimal bleeding.,  No active bleeding right nare.  No obvious hematoma. ?   Mouth/Throat:  ?   Mouth: Mucous membranes are moist.  ?Eyes:  ?   General:     ?   Right eye: No discharge.     ?   Left eye: No discharge.  ?Neck:  ?   Trachea: No tracheal deviation.  ?Cardiovascular:  ?   Rate and Rhythm: Normal rate.  ?Pulmonary:  ?   Effort: Pulmonary effort is normal.  ?Abdominal:  ?   General: There is no distension.  ?   Palpations: Abdomen is soft.  ?   Tenderness: There  is no abdominal tenderness.  ?Musculoskeletal:  ?   Cervical back: Normal range of motion.  ?Skin: ?   General: Skin is warm.  ?   Capillary Refill: Capillary refill takes less than 2 seconds.  ?Neurological:  ?   General: No focal deficit present.  ?   Mental Status: He is alert.  ?   Cranial Nerves: No cranial nerve deficit.  ?Psychiatric:     ?   Mood and Affect: Mood normal.  ? ? ?ED Results / Procedures / Treatments   ?Labs ?(all labs ordered are listed, but only abnormal results are displayed) ?Labs Reviewed  ?CBC WITH DIFFERENTIAL/PLATELET - Abnormal; Notable for the following components:  ?    Result Value  ? RBC 3.41 (*)   ? Hemoglobin 10.3 (*)   ? HCT 33.4 (*)   ? RDW 19.4 (*)   ? All other components within normal limits  ? ? ?EKG ?None ? ?Radiology ?No results found. ? ?Procedures ?Procedures  ? ? ?Medications Ordered in ED ?Medications  ?acetaminophen (TYLENOL) tablet 1,000 mg (has no administration in time range)  ? ? ?ED Course/ Medical Decision Making/ A&P ?  ?                        ?Medical Decision Making ?Amount and/or Complexity of Data Reviewed ?Labs: ordered. ? ?Risk ?OTC drugs. ? ? ?Patient presents for persistent mild left nare epistaxis.  With patient on antiplatelet and anticoagulant likely the cause given recent cold weather and patient needing nasal cannula oxygenation as needed which dries his nares.  Fortunately bleeding improved significantly ER.  Rhino Rocket placed left nare posterior, 5 cc of air put in.  On reassessment no active bleeding.  Patient will need to put nasal cannula only in the right nare until she sees ENT in 2 days for reassessment.  We will hold anticoagulant for 2 days.  Blood work showed anemia mild 10.3, discussed with patient on need repeat hemoglobin and recheck in 2 to 3 days.  Patient stable for discharge at this time and patient has no symptoms of anemia at this time. ? ? ? ? ? ? ? ?Final Clinical Impression(s) / ED Diagnoses ?Final diagnoses:  ?Epistaxis   ?Blood loss anemia  ? ? ?Rx / DC Orders ?ED Discharge Orders   ? ? None  ? ?  ? ? ?  ?Elnora Morrison, MD ?07/02/21 1317 ? ?

## 2021-07-02 NOTE — ED Notes (Signed)
Report called to [facility]. 

## 2021-07-02 NOTE — Discharge Instructions (Addendum)
Follow-up with ENT in 2 to 3 days, call today for appointment. ?HOLD eliquis for 2 days. ?Use tylenol 1000 mg for pain every 4 hrs as needed. ?Have primary doctor repeat Hemoglobin check in 2 days. ?For uncontrolled bleeding call their office or if unable to get in return to the emergency room. ? ?

## 2021-07-03 DIAGNOSIS — U099 Post covid-19 condition, unspecified: Secondary | ICD-10-CM | POA: Diagnosis not present

## 2021-07-03 DIAGNOSIS — G9340 Encephalopathy, unspecified: Secondary | ICD-10-CM | POA: Diagnosis not present

## 2021-07-03 DIAGNOSIS — A419 Sepsis, unspecified organism: Secondary | ICD-10-CM | POA: Diagnosis not present

## 2021-07-03 DIAGNOSIS — R0602 Shortness of breath: Secondary | ICD-10-CM | POA: Diagnosis not present

## 2021-07-03 DIAGNOSIS — I5042 Chronic combined systolic (congestive) and diastolic (congestive) heart failure: Secondary | ICD-10-CM | POA: Diagnosis not present

## 2021-07-03 DIAGNOSIS — I4891 Unspecified atrial fibrillation: Secondary | ICD-10-CM | POA: Diagnosis not present

## 2021-07-03 DIAGNOSIS — R042 Hemoptysis: Secondary | ICD-10-CM | POA: Diagnosis not present

## 2021-07-03 DIAGNOSIS — E119 Type 2 diabetes mellitus without complications: Secondary | ICD-10-CM | POA: Diagnosis not present

## 2021-07-03 DIAGNOSIS — M6281 Muscle weakness (generalized): Secondary | ICD-10-CM | POA: Diagnosis not present

## 2021-07-08 DIAGNOSIS — M6281 Muscle weakness (generalized): Secondary | ICD-10-CM | POA: Diagnosis not present

## 2021-07-08 DIAGNOSIS — E119 Type 2 diabetes mellitus without complications: Secondary | ICD-10-CM | POA: Diagnosis not present

## 2021-07-08 DIAGNOSIS — R042 Hemoptysis: Secondary | ICD-10-CM | POA: Diagnosis not present

## 2021-07-08 DIAGNOSIS — A419 Sepsis, unspecified organism: Secondary | ICD-10-CM | POA: Diagnosis not present

## 2021-07-08 DIAGNOSIS — G9340 Encephalopathy, unspecified: Secondary | ICD-10-CM | POA: Diagnosis not present

## 2021-07-08 DIAGNOSIS — I5042 Chronic combined systolic (congestive) and diastolic (congestive) heart failure: Secondary | ICD-10-CM | POA: Diagnosis not present

## 2021-07-08 DIAGNOSIS — U099 Post covid-19 condition, unspecified: Secondary | ICD-10-CM | POA: Diagnosis not present

## 2021-07-08 DIAGNOSIS — I4891 Unspecified atrial fibrillation: Secondary | ICD-10-CM | POA: Diagnosis not present

## 2021-07-08 DIAGNOSIS — R0602 Shortness of breath: Secondary | ICD-10-CM | POA: Diagnosis not present

## 2021-07-09 DIAGNOSIS — L97412 Non-pressure chronic ulcer of right heel and midfoot with fat layer exposed: Secondary | ICD-10-CM | POA: Diagnosis not present

## 2021-07-09 DIAGNOSIS — L988 Other specified disorders of the skin and subcutaneous tissue: Secondary | ICD-10-CM | POA: Diagnosis not present

## 2021-07-10 DIAGNOSIS — G9340 Encephalopathy, unspecified: Secondary | ICD-10-CM | POA: Diagnosis not present

## 2021-07-10 DIAGNOSIS — I4891 Unspecified atrial fibrillation: Secondary | ICD-10-CM | POA: Diagnosis not present

## 2021-07-10 DIAGNOSIS — R042 Hemoptysis: Secondary | ICD-10-CM | POA: Diagnosis not present

## 2021-07-10 DIAGNOSIS — E119 Type 2 diabetes mellitus without complications: Secondary | ICD-10-CM | POA: Diagnosis not present

## 2021-07-10 DIAGNOSIS — M6281 Muscle weakness (generalized): Secondary | ICD-10-CM | POA: Diagnosis not present

## 2021-07-10 DIAGNOSIS — A419 Sepsis, unspecified organism: Secondary | ICD-10-CM | POA: Diagnosis not present

## 2021-07-10 DIAGNOSIS — R0602 Shortness of breath: Secondary | ICD-10-CM | POA: Diagnosis not present

## 2021-07-10 DIAGNOSIS — I5042 Chronic combined systolic (congestive) and diastolic (congestive) heart failure: Secondary | ICD-10-CM | POA: Diagnosis not present

## 2021-07-10 DIAGNOSIS — U099 Post covid-19 condition, unspecified: Secondary | ICD-10-CM | POA: Diagnosis not present

## 2021-07-11 ENCOUNTER — Other Ambulatory Visit: Payer: Self-pay

## 2021-07-11 ENCOUNTER — Inpatient Hospital Stay (HOSPITAL_COMMUNITY)
Admission: EM | Admit: 2021-07-11 | Discharge: 2021-07-19 | DRG: 291 | Disposition: A | Discharge status: Skilled Nursing Facility | Payer: Medicare HMO | Source: Skilled Nursing Facility | Attending: Internal Medicine | Admitting: Internal Medicine

## 2021-07-11 ENCOUNTER — Emergency Department (HOSPITAL_COMMUNITY): Payer: Medicare HMO

## 2021-07-11 ENCOUNTER — Encounter (HOSPITAL_COMMUNITY): Payer: Self-pay

## 2021-07-11 DIAGNOSIS — R042 Hemoptysis: Secondary | ICD-10-CM | POA: Diagnosis not present

## 2021-07-11 DIAGNOSIS — F32A Depression, unspecified: Secondary | ICD-10-CM | POA: Diagnosis present

## 2021-07-11 DIAGNOSIS — I441 Atrioventricular block, second degree: Secondary | ICD-10-CM | POA: Diagnosis present

## 2021-07-11 DIAGNOSIS — N400 Enlarged prostate without lower urinary tract symptoms: Secondary | ICD-10-CM | POA: Diagnosis present

## 2021-07-11 DIAGNOSIS — R262 Difficulty in walking, not elsewhere classified: Secondary | ICD-10-CM | POA: Diagnosis not present

## 2021-07-11 DIAGNOSIS — I130 Hypertensive heart and chronic kidney disease with heart failure and stage 1 through stage 4 chronic kidney disease, or unspecified chronic kidney disease: Secondary | ICD-10-CM | POA: Diagnosis not present

## 2021-07-11 DIAGNOSIS — Z955 Presence of coronary angioplasty implant and graft: Secondary | ICD-10-CM

## 2021-07-11 DIAGNOSIS — I2510 Atherosclerotic heart disease of native coronary artery without angina pectoris: Secondary | ICD-10-CM | POA: Diagnosis present

## 2021-07-11 DIAGNOSIS — R41841 Cognitive communication deficit: Secondary | ICD-10-CM | POA: Diagnosis not present

## 2021-07-11 DIAGNOSIS — Z8616 Personal history of COVID-19: Secondary | ICD-10-CM | POA: Diagnosis not present

## 2021-07-11 DIAGNOSIS — I10 Essential (primary) hypertension: Secondary | ICD-10-CM | POA: Diagnosis present

## 2021-07-11 DIAGNOSIS — N182 Chronic kidney disease, stage 2 (mild): Secondary | ICD-10-CM | POA: Diagnosis present

## 2021-07-11 DIAGNOSIS — Z7901 Long term (current) use of anticoagulants: Secondary | ICD-10-CM | POA: Diagnosis not present

## 2021-07-11 DIAGNOSIS — S81802A Unspecified open wound, left lower leg, initial encounter: Secondary | ICD-10-CM | POA: Diagnosis not present

## 2021-07-11 DIAGNOSIS — Z8701 Personal history of pneumonia (recurrent): Secondary | ICD-10-CM

## 2021-07-11 DIAGNOSIS — I483 Typical atrial flutter: Secondary | ICD-10-CM

## 2021-07-11 DIAGNOSIS — N1832 Chronic kidney disease, stage 3b: Secondary | ICD-10-CM | POA: Diagnosis present

## 2021-07-11 DIAGNOSIS — R040 Epistaxis: Secondary | ICD-10-CM | POA: Diagnosis not present

## 2021-07-11 DIAGNOSIS — N183 Chronic kidney disease, stage 3 unspecified: Secondary | ICD-10-CM | POA: Diagnosis not present

## 2021-07-11 DIAGNOSIS — I509 Heart failure, unspecified: Principal | ICD-10-CM

## 2021-07-11 DIAGNOSIS — E782 Mixed hyperlipidemia: Secondary | ICD-10-CM | POA: Diagnosis present

## 2021-07-11 DIAGNOSIS — J9610 Chronic respiratory failure, unspecified whether with hypoxia or hypercapnia: Secondary | ICD-10-CM | POA: Diagnosis not present

## 2021-07-11 DIAGNOSIS — F17210 Nicotine dependence, cigarettes, uncomplicated: Secondary | ICD-10-CM | POA: Diagnosis present

## 2021-07-11 DIAGNOSIS — I255 Ischemic cardiomyopathy: Secondary | ICD-10-CM | POA: Diagnosis present

## 2021-07-11 DIAGNOSIS — R2689 Other abnormalities of gait and mobility: Secondary | ICD-10-CM | POA: Diagnosis not present

## 2021-07-11 DIAGNOSIS — I5042 Chronic combined systolic (congestive) and diastolic (congestive) heart failure: Secondary | ICD-10-CM | POA: Diagnosis not present

## 2021-07-11 DIAGNOSIS — Z66 Do not resuscitate: Secondary | ICD-10-CM | POA: Diagnosis present

## 2021-07-11 DIAGNOSIS — D649 Anemia, unspecified: Secondary | ICD-10-CM | POA: Diagnosis not present

## 2021-07-11 DIAGNOSIS — I083 Combined rheumatic disorders of mitral, aortic and tricuspid valves: Secondary | ICD-10-CM | POA: Diagnosis present

## 2021-07-11 DIAGNOSIS — J9601 Acute respiratory failure with hypoxia: Secondary | ICD-10-CM | POA: Diagnosis not present

## 2021-07-11 DIAGNOSIS — Z79899 Other long term (current) drug therapy: Secondary | ICD-10-CM

## 2021-07-11 DIAGNOSIS — R778 Other specified abnormalities of plasma proteins: Secondary | ICD-10-CM | POA: Diagnosis not present

## 2021-07-11 DIAGNOSIS — G4733 Obstructive sleep apnea (adult) (pediatric): Secondary | ICD-10-CM | POA: Diagnosis present

## 2021-07-11 DIAGNOSIS — Z7982 Long term (current) use of aspirin: Secondary | ICD-10-CM

## 2021-07-11 DIAGNOSIS — I5021 Acute systolic (congestive) heart failure: Secondary | ICD-10-CM | POA: Diagnosis not present

## 2021-07-11 DIAGNOSIS — Z8249 Family history of ischemic heart disease and other diseases of the circulatory system: Secondary | ICD-10-CM

## 2021-07-11 DIAGNOSIS — E1122 Type 2 diabetes mellitus with diabetic chronic kidney disease: Secondary | ICD-10-CM | POA: Diagnosis present

## 2021-07-11 DIAGNOSIS — I110 Hypertensive heart disease with heart failure: Secondary | ICD-10-CM | POA: Diagnosis not present

## 2021-07-11 DIAGNOSIS — G9340 Encephalopathy, unspecified: Secondary | ICD-10-CM | POA: Diagnosis not present

## 2021-07-11 DIAGNOSIS — Z20822 Contact with and (suspected) exposure to covid-19: Secondary | ICD-10-CM | POA: Diagnosis present

## 2021-07-11 DIAGNOSIS — D472 Monoclonal gammopathy: Secondary | ICD-10-CM | POA: Diagnosis present

## 2021-07-11 DIAGNOSIS — N179 Acute kidney failure, unspecified: Secondary | ICD-10-CM | POA: Diagnosis present

## 2021-07-11 DIAGNOSIS — Z96652 Presence of left artificial knee joint: Secondary | ICD-10-CM | POA: Diagnosis present

## 2021-07-11 DIAGNOSIS — E119 Type 2 diabetes mellitus without complications: Secondary | ICD-10-CM | POA: Diagnosis not present

## 2021-07-11 DIAGNOSIS — M109 Gout, unspecified: Secondary | ICD-10-CM | POA: Diagnosis present

## 2021-07-11 DIAGNOSIS — I4892 Unspecified atrial flutter: Secondary | ICD-10-CM | POA: Diagnosis present

## 2021-07-11 DIAGNOSIS — E1169 Type 2 diabetes mellitus with other specified complication: Secondary | ICD-10-CM | POA: Diagnosis not present

## 2021-07-11 DIAGNOSIS — F29 Unspecified psychosis not due to a substance or known physiological condition: Secondary | ICD-10-CM | POA: Diagnosis not present

## 2021-07-11 DIAGNOSIS — R0902 Hypoxemia: Secondary | ICD-10-CM | POA: Diagnosis not present

## 2021-07-11 DIAGNOSIS — U099 Post covid-19 condition, unspecified: Secondary | ICD-10-CM | POA: Diagnosis not present

## 2021-07-11 DIAGNOSIS — R0600 Dyspnea, unspecified: Secondary | ICD-10-CM | POA: Diagnosis not present

## 2021-07-11 DIAGNOSIS — R0602 Shortness of breath: Secondary | ICD-10-CM | POA: Diagnosis not present

## 2021-07-11 DIAGNOSIS — M6258 Muscle wasting and atrophy, not elsewhere classified, other site: Secondary | ICD-10-CM | POA: Diagnosis not present

## 2021-07-11 DIAGNOSIS — Z91048 Other nonmedicinal substance allergy status: Secondary | ICD-10-CM

## 2021-07-11 DIAGNOSIS — Z7902 Long term (current) use of antithrombotics/antiplatelets: Secondary | ICD-10-CM

## 2021-07-11 DIAGNOSIS — Z888 Allergy status to other drugs, medicaments and biological substances status: Secondary | ICD-10-CM

## 2021-07-11 DIAGNOSIS — I4891 Unspecified atrial fibrillation: Secondary | ICD-10-CM | POA: Diagnosis not present

## 2021-07-11 DIAGNOSIS — A419 Sepsis, unspecified organism: Secondary | ICD-10-CM | POA: Diagnosis not present

## 2021-07-11 DIAGNOSIS — I5043 Acute on chronic combined systolic (congestive) and diastolic (congestive) heart failure: Secondary | ICD-10-CM | POA: Diagnosis not present

## 2021-07-11 DIAGNOSIS — Z7401 Bed confinement status: Secondary | ICD-10-CM | POA: Diagnosis not present

## 2021-07-11 DIAGNOSIS — R1312 Dysphagia, oropharyngeal phase: Secondary | ICD-10-CM | POA: Diagnosis not present

## 2021-07-11 DIAGNOSIS — M6281 Muscle weakness (generalized): Secondary | ICD-10-CM | POA: Diagnosis not present

## 2021-07-11 LAB — COMPREHENSIVE METABOLIC PANEL
ALT: 18 U/L (ref 0–44)
AST: 39 U/L (ref 15–41)
Albumin: 2.8 g/dL — ABNORMAL LOW (ref 3.5–5.0)
Alkaline Phosphatase: 80 U/L (ref 38–126)
Anion gap: 9 (ref 5–15)
BUN: 35 mg/dL — ABNORMAL HIGH (ref 8–23)
CO2: 22 mmol/L (ref 22–32)
Calcium: 8.4 mg/dL — ABNORMAL LOW (ref 8.9–10.3)
Chloride: 108 mmol/L (ref 98–111)
Creatinine, Ser: 2 mg/dL — ABNORMAL HIGH (ref 0.61–1.24)
GFR, Estimated: 32 mL/min — ABNORMAL LOW (ref >60)
Glucose, Bld: 89 mg/dL (ref 70–99)
Potassium: 5.1 mmol/L (ref 3.5–5.1)
Sodium: 139 mmol/L (ref 135–145)
Total Bilirubin: 0.8 mg/dL (ref 0.3–1.2)
Total Protein: 6.7 g/dL (ref 6.5–8.1)

## 2021-07-11 LAB — CBC WITH DIFFERENTIAL/PLATELET
Abs Immature Granulocytes: 0.03 10*3/uL (ref 0.00–0.07)
Basophils Absolute: 0.1 10*3/uL (ref 0.0–0.1)
Basophils Relative: 1 %
Eosinophils Absolute: 0.8 10*3/uL — ABNORMAL HIGH (ref 0.0–0.5)
Eosinophils Relative: 13 %
HCT: 32.8 % — ABNORMAL LOW (ref 39.0–52.0)
Hemoglobin: 10.4 g/dL — ABNORMAL LOW (ref 13.0–17.0)
Immature Granulocytes: 1 %
Lymphocytes Relative: 17 %
Lymphs Abs: 1.1 10*3/uL (ref 0.7–4.0)
MCH: 30.1 pg (ref 26.0–34.0)
MCHC: 31.7 g/dL (ref 30.0–36.0)
MCV: 95.1 fL (ref 80.0–100.0)
Monocytes Absolute: 0.7 10*3/uL (ref 0.1–1.0)
Monocytes Relative: 11 %
Neutro Abs: 3.6 10*3/uL (ref 1.7–7.7)
Neutrophils Relative %: 57 %
Platelets: 317 10*3/uL (ref 150–400)
RBC: 3.45 MIL/uL — ABNORMAL LOW (ref 4.22–5.81)
RDW: 18.6 % — ABNORMAL HIGH (ref 11.5–15.5)
WBC: 6.3 10*3/uL (ref 4.0–10.5)
nRBC: 0 % (ref 0.0–0.2)

## 2021-07-11 LAB — HEPATIC FUNCTION PANEL
ALT: 18 U/L (ref 0–44)
AST: 34 U/L (ref 15–41)
Albumin: 2.7 g/dL — ABNORMAL LOW (ref 3.5–5.0)
Alkaline Phosphatase: 74 U/L (ref 38–126)
Bilirubin, Direct: 0.3 mg/dL — ABNORMAL HIGH (ref 0.0–0.2)
Indirect Bilirubin: 0.6 mg/dL (ref 0.3–0.9)
Total Bilirubin: 0.9 mg/dL (ref 0.3–1.2)
Total Protein: 6.8 g/dL (ref 6.5–8.1)

## 2021-07-11 LAB — CBG MONITORING, ED: Glucose-Capillary: 138 mg/dL — ABNORMAL HIGH (ref 70–99)

## 2021-07-11 LAB — DIFFERENTIAL
Abs Immature Granulocytes: 0.02 10*3/uL (ref 0.00–0.07)
Basophils Absolute: 0.1 10*3/uL (ref 0.0–0.1)
Basophils Relative: 1 %
Eosinophils Absolute: 0.7 10*3/uL — ABNORMAL HIGH (ref 0.0–0.5)
Eosinophils Relative: 12 %
Immature Granulocytes: 0 %
Lymphocytes Relative: 14 %
Lymphs Abs: 0.8 10*3/uL (ref 0.7–4.0)
Monocytes Absolute: 0.4 10*3/uL (ref 0.1–1.0)
Monocytes Relative: 8 %
Neutro Abs: 3.7 10*3/uL (ref 1.7–7.7)
Neutrophils Relative %: 65 %

## 2021-07-11 LAB — TROPONIN I (HIGH SENSITIVITY)
Troponin I (High Sensitivity): 54 ng/L — ABNORMAL HIGH (ref <18)
Troponin I (High Sensitivity): 52 ng/L — ABNORMAL HIGH (ref <18)

## 2021-07-11 LAB — PHOSPHORUS: Phosphorus: 3.1 mg/dL (ref 2.5–4.6)

## 2021-07-11 LAB — RESP PANEL BY RT-PCR (FLU A&B, COVID) ARPGX2
Influenza A by PCR: NEGATIVE
Influenza B by PCR: NEGATIVE
SARS Coronavirus 2 by RT PCR: NEGATIVE

## 2021-07-11 LAB — MAGNESIUM: Magnesium: 1.9 mg/dL (ref 1.7–2.4)

## 2021-07-11 LAB — BRAIN NATRIURETIC PEPTIDE: B Natriuretic Peptide: 1590.3 pg/mL — ABNORMAL HIGH (ref 0.0–100.0)

## 2021-07-11 LAB — CK: Total CK: 174 U/L (ref 49–397)

## 2021-07-11 MED ORDER — SODIUM CHLORIDE 0.9 % IV SOLN
250.0000 mL | INTRAVENOUS | Status: DC | PRN
Start: 2021-07-11 — End: 2021-07-19

## 2021-07-11 MED ORDER — CARVEDILOL 3.125 MG PO TABS
3.1250 mg | ORAL_TABLET | Freq: Two times a day (BID) | ORAL | Status: DC
  Administered 2021-07-11 – 2021-07-12 (×2): 3.125 mg via ORAL
  Filled 2021-07-11 (×2): qty 1

## 2021-07-11 MED ORDER — TAMSULOSIN HCL 0.4 MG PO CAPS
0.4000 mg | ORAL_CAPSULE | Freq: Every day | ORAL | Status: DC
  Administered 2021-07-12 – 2021-07-19 (×8): 0.4 mg via ORAL
  Filled 2021-07-11 (×8): qty 1

## 2021-07-11 MED ORDER — ACETAMINOPHEN 325 MG PO TABS: 650.0000 mg | ORAL_TABLET | Freq: Four times a day (QID) | ORAL | Status: DC | PRN

## 2021-07-11 MED ORDER — INSULIN ASPART 100 UNIT/ML IJ SOLN: 0.0000 [IU] | Freq: Three times a day (TID) | INTRAMUSCULAR | Status: DC

## 2021-07-11 MED ORDER — GUAIFENESIN ER 600 MG PO TB12
600.0000 mg | ORAL_TABLET | Freq: Two times a day (BID) | ORAL | Status: DC
  Administered 2021-07-11 – 2021-07-19 (×16): 600 mg via ORAL
  Filled 2021-07-11 (×16): qty 1

## 2021-07-11 MED ORDER — CLOPIDOGREL BISULFATE 75 MG PO TABS: 75.0000 mg | ORAL_TABLET | Freq: Every day | ORAL | Status: DC

## 2021-07-11 MED ORDER — CITALOPRAM HYDROBROMIDE 20 MG PO TABS
20.0000 mg | ORAL_TABLET | Freq: Every day | ORAL | Status: DC
  Administered 2021-07-12 – 2021-07-19 (×8): 20 mg via ORAL
  Filled 2021-07-11 (×8): qty 1

## 2021-07-11 MED ORDER — ALLOPURINOL 100 MG PO TABS
100.0000 mg | ORAL_TABLET | Freq: Every day | ORAL | Status: DC
  Administered 2021-07-12 – 2021-07-19 (×8): 100 mg via ORAL
  Filled 2021-07-11 (×8): qty 1

## 2021-07-11 MED ORDER — ALBUTEROL SULFATE (2.5 MG/3ML) 0.083% IN NEBU: 2.5000 mg | INHALATION_SOLUTION | RESPIRATORY_TRACT | Status: DC | PRN

## 2021-07-11 MED ORDER — SODIUM CHLORIDE 0.9% FLUSH
3.0000 mL | INTRAVENOUS | Status: DC | PRN
  Administered 2021-07-12: 3 mL via INTRAVENOUS

## 2021-07-11 MED ORDER — IPRATROPIUM-ALBUTEROL 0.5-2.5 (3) MG/3ML IN SOLN
3.0000 mL | Freq: Four times a day (QID) | RESPIRATORY_TRACT | Status: DC
  Administered 2021-07-11 – 2021-07-13 (×7): 3 mL via RESPIRATORY_TRACT
  Filled 2021-07-11 (×7): qty 3

## 2021-07-11 MED ORDER — SODIUM CHLORIDE 0.9% FLUSH
3.0000 mL | Freq: Two times a day (BID) | INTRAVENOUS | Status: DC
  Administered 2021-07-11 – 2021-07-19 (×16): 3 mL via INTRAVENOUS

## 2021-07-11 MED ORDER — APIXABAN 2.5 MG PO TABS
2.5000 mg | ORAL_TABLET | Freq: Two times a day (BID) | ORAL | Status: DC
  Administered 2021-07-11 – 2021-07-19 (×16): 2.5 mg via ORAL
  Filled 2021-07-11 (×17): qty 1

## 2021-07-11 MED ORDER — HYDROCODONE-ACETAMINOPHEN 5-325 MG PO TABS
1.0000 | ORAL_TABLET | ORAL | Status: DC | PRN
  Administered 2021-07-17: 2 via ORAL
  Administered 2021-07-17 – 2021-07-18 (×2): 1 via ORAL
  Filled 2021-07-11: qty 2
  Filled 2021-07-11 (×2): qty 1

## 2021-07-11 MED ORDER — ACETAMINOPHEN 650 MG RE SUPP: 650.0000 mg | Freq: Four times a day (QID) | RECTAL | Status: DC | PRN

## 2021-07-11 MED ORDER — FUROSEMIDE 10 MG/ML IJ SOLN
40.0000 mg | Freq: Once | INTRAMUSCULAR | Status: AC
  Administered 2021-07-11: 40 mg via INTRAVENOUS
  Filled 2021-07-11: qty 4

## 2021-07-11 MED ORDER — ASPIRIN EC 81 MG PO TBEC
81.0000 mg | DELAYED_RELEASE_TABLET | Freq: Every day | ORAL | Status: DC
  Administered 2021-07-12 – 2021-07-19 (×8): 81 mg via ORAL
  Filled 2021-07-11 (×8): qty 1

## 2021-07-11 MED ORDER — FUROSEMIDE 10 MG/ML IJ SOLN: 40.0000 mg | Freq: Every day | INTRAMUSCULAR | Status: DC

## 2021-07-11 MED ORDER — INSULIN ASPART 100 UNIT/ML IJ SOLN
0.0000 [IU] | INTRAMUSCULAR | Status: DC
  Administered 2021-07-11: 1 [IU] via SUBCUTANEOUS

## 2021-07-11 NOTE — H&P (Addendum)
? ? ?Kevin Schwartz YEM:[id number] DOB: 1930-09-01 DOA: 07/11/2021 ? ? ?  ?PCP: [name], MD   ?Outpatient Specialists:  ?CARDS:  Dr. Quentin Ore, Bensimhon ?  ? ?Patient arrived to ER on 07/11/21 at 1603 ?Referred by Attending Gareth Morgan, MD ? ? ?Patient coming from:   ? ?From facility [facility] ? ?Chief Complaint:  ?Chief Complaint  ?Patient presents with  ? Shortness of Breath  ? ? ?HPI: ?Kevin Schwartz is a 88 y.o. male with medical history significant of  CAD with stenting on Plavix and Eliquis regimen, chronic systolic CHF LVEF 75-30%, CKD stage IIIa, HTN, HLD, BPH,  DM2, ?  ? ?Presented with   shortness of breath ?Pt was found at the facility in respiratory distress with O2 sats down to 60% on Ra ?Had [place] in January 2023 and has been in [facility]  ?Reports he has been more short of breath for the past few months ?No CP no fever ?Leg swelling for the past 3 months ?No NVD ?Nasal packing was in place and was taken out today ?Last done during admission in January patient tested positive for COVID was treated with remdesivir and steroids developed AKI ?He has had hypoxia initially thought to be secondary to COVID but then thought to be secondary to fluid overload. ? ? ? Initial COVID TEST  ?NEGATIVE ? ?Lab Results  ?Component Value Date  ? [place] NEGATIVE 07/11/2021  ? SARSCOV2NAA POSITIVE (A) 04/16/2021  ? SARSCOV2NAA DETECTED (A) 04/11/2021  ? [place] NEGATIVE 07/31/2020  ? ?  ?Regarding pertinent Chronic problems:   ? ? Hyperlipidemia -  not on statins ?Lipid Panel  ?   ?Component Value Date/Time  ? CHOL 94 (L) 01/17/2021 1023  ? TRIG 60 01/17/2021 1023  ? HDL 44 01/17/2021 1023  ? CHOLHDL 2.1 01/17/2021 1023  ? CHOLHDL 2.7 08/01/2020 0320  ? VLDL 10 08/01/2020 0320  ? LDLCALC 36 01/17/2021 1023  ? [place] 80 05/05/2019 1005  ? LABVLDL 14 01/17/2021 1023  ? ? ? HTN on Not on beta-blocker due to heart block ? ? chronic CHF diastolic/systolic/ combined - last  echo jan 2023 Left ventricular ejection fraction, by estimation, is 45 to 50%. The  ?left ventricle has mildly decreased function. The left ventricle  ?demonstrates global hypokinesis.  ?Moderate to severe MR was noted. ?EF is noted to be 45 to 50% on echocardiogram done in April 2022. ? ?Not on ACE inhibitor or ARB or Entresto due to history of angioedema on ACE inhibitor and ARB. ? ?  CAD  - On Aspirin, statin,   Plavix  ?               -  followed by cardiology ?              ? ?  DM 2 -  ?Lab Results  ?Component Value Date  ? HGBA1C 6.2 (H) 04/16/2021  ?  diet controlled ?   ? ? A. Fib/flutter -  - CHA2DS2 vas score   6   ?   ? current  on anticoagulation with  Eliquis,  ?   ? CKD stage IIIb- baseline Cr 1.6 ?Estimated Creatinine Clearance: 26.3 mL/min (A) (by C-G formula based on SCr of 2 mg/dL (H)). ? ?Lab Results  ?Component Value Date  ? CREATININE 2.00 (H) 07/11/2021  ? CREATININE 1.59 (H) 04/27/2021  ? CREATININE 1.83 (H) 04/25/2021  ? ? Chronic anemia - baseline hg Hemoglobin & Hematocrit  ?  Recent Labs  ?  04/27/21 ?1414 07/02/21 ?1137 07/11/21 ?1827  ?HGB 15.4 10.3* 10.4*  ? ?   ? ?While in ER: ?  ? ?In ER he was actually not hypoxic ?Chest x-ray showing small bilateral effusions atelectasis less likely infiltrate ?No pneumonia symptoms at this time. ? ?Ordered ? ?CXR - Mild bibasilar atelectasis or infiltrate. Small associated right ?pleural effusion. ?  ? ?Following Medications were ordered in ER: ?Medications  ?furosemide (LASIX) injection 40 mg (has no administration in time range)  ?  ?____________  ?  ?ED Triage Vitals  ?Enc Vitals Group  ?   BP 07/11/21 1614 121/75  ?   Pulse Rate 07/11/21 1614 83  ?   Resp 07/11/21 1614 14  ?   Temp 07/11/21 1614 98 ?F (36.7 ?C)  ?   Temp Source 07/11/21 1614 Oral  ?   SpO2 07/11/21 1611 100 %  ?   Weight 07/11/21 1615 182 lb 1.6 oz (82.6 kg)  ?   Height 07/11/21 1615 '5\' 7"'$  (1.702 m)  ?   Head Circumference --   ?   Peak Flow --   ?   Pain Score 07/11/21 1614 0   ?   Pain Loc --   ?   Pain Edu? --   ?   Excl. in [place]? --   ?KNLZ(76)@    ? _________________________________________ ?Significant initial  Findings: ?Abnormal Labs Reviewed  ?CBC WITH DIFFERENTIAL/PLATELET - Abnormal; Notable for the following components:  ?    Result Value  ? RBC 3.45 (*)   ? Hemoglobin 10.4 (*)   ? HCT 32.8 (*)   ? RDW 18.6 (*)   ? Eosinophils Absolute 0.8 (*)   ? All other components within normal limits  ?COMPREHENSIVE METABOLIC PANEL - Abnormal; Notable for the following components:  ? BUN 35 (*)   ? Creatinine, Ser 2.00 (*)   ? Calcium 8.4 (*)   ? Albumin 2.8 (*)   ? GFR, Estimated 32 (*)   ? All other components within normal limits  ?BRAIN NATRIURETIC PEPTIDE - Abnormal; Notable for the following components:  ? B Natriuretic Peptide 1,590.3 (*)   ? All other components within normal limits  ?CBG MONITORING, ED - Abnormal; Notable for the following components:  ? Glucose-Capillary 138 (*)   ? All other components within normal limits  ?TROPONIN I (HIGH SENSITIVITY) - Abnormal; Notable for the following components:  ? Troponin I (High Sensitivity) 54 (*)   ? All other components within normal limits  ?TROPONIN I (HIGH SENSITIVITY) - Abnormal; Notable for the following components:  ? Troponin I (High Sensitivity) 52 (*)   ? All other components within normal limits  ? ?  ?_________________________ ?Troponin 54 -  52 ?ECG: Ordered ?Personally reviewed by me showing: ?HR : 82 ?RhythmAtrial premature complex ?Sinus pause ?Prolonged PR interval ?Low voltage, extremity leads ?Borderline prolonged QT interval ?QTC 491 ?   ?The recent clinical data is shown below. ?Vitals:  ? 07/11/21 2015 07/11/21 2030 07/11/21 2045 07/11/21 2100  ?BP:  121/82  129/78  ?Pulse: 88  88 93  ?Resp: (!) '21 16 15 '$ (!) 21  ?Temp:      ?TempSrc:      ?SpO2: (!) 86%  100% 100%  ?Weight:      ?Height:      ? ?WBC ? ?   ?Component Value Date/Time  ? WBC 6.3 07/11/2021 1827  ? LYMPHSABS 1.1 07/11/2021 1827  ? LYMPHSABS 1.8  07/30/2015 0919  ? LYMPHSABS 0.9 10/06/2013 1016  ? MONOABS 0.7 07/11/2021 1827  ? MONOABS 0.5 10/06/2013 1016  ? EOSABS 0.8 (H) 07/11/2021 1827  ? EOSABS 0.3 07/30/2015 0919  ? BASOSABS 0.1 07/11/2021 1827  ? BASOSABS 0.0 07/30/2015 0919  ? BASOSABS 0.1 10/06/2013 1016  ?   ? ? Results for orders placed or performed during the hospital encounter of 07/11/21  ?Resp Panel by RT-PCR (Flu A&B, Covid) Nasopharyngeal Swab     Status: None  ? Collection Time: 07/11/21  5:40 PM  ? Specimen: Nasopharyngeal Swab; Nasopharyngeal(NP) swabs in vial transport medium  ?Result Value Ref Range Status  ? SARS Coronavirus 2 by RT PCR NEGATIVE NEGATIVE Final  ?      ? Influenza A by PCR NEGATIVE NEGATIVE Final  ? Influenza B by PCR NEGATIVE NEGATIVE Final  ?      ? ? ? ?_______________________________________________ ?Hospitalist was called for admission for acute on chronic systolic chf ? ? ?The following Work up has been ordered so far: ? ?Orders Placed This Encounter  ?Procedures  ? Resp Panel by RT-PCR (Flu A&B, Covid) Nasopharyngeal Swab  ? DG Chest Portable 1 View  ? CBC with Differential  ? Comprehensive metabolic panel  ? Brain natriuretic peptide  ? Consult to hospitalist  ? EKG 12-Lead  ? Saline lock IV  ?  ? ?OTHER Significant initial  Findings: ? ?labs showing: ? ?  ?Recent Labs  ?Lab 07/11/21 ?1827  ?NA 139  ?K 5.1  ?CO2 22  ?GLUCOSE 89  ?BUN 35*  ?CREATININE 2.00*  ?CALCIUM 8.4*  ? ? ?Cr  Up from baseline see below ?Lab Results  ?Component Value Date  ? CREATININE 2.00 (H) 07/11/2021  ? CREATININE 1.59 (H) 04/27/2021  ? CREATININE 1.83 (H) 04/25/2021  ? ? ?Recent Labs  ?Lab 07/11/21 ?1827  ?AST 39  ?ALT 18  ?ALKPHOS 80  ?BILITOT 0.8  ?PROT 6.7  ?ALBUMIN 2.8*  ? ?Lab Results  ?Component Value Date  ? CALCIUM 8.4 (L) 07/11/2021  ? PHOS 2.6 04/25/2021  ? ?    ?   ?Plt: ?Lab Results  ?Component Value Date  ? PLT 317 07/11/2021  ?  ?COVID-19 Labs ? ?No results for input(s): DDIMER, FERRITIN, LDH, CRP in the last 72  hours. ? ?Lab Results  ?Component Value Date  ? [place] NEGATIVE 07/11/2021  ? SARSCOV2NAA POSITIVE (A) 04/16/2021  ? SARSCOV2NAA DETECTED (A) 04/11/2021  ? [place] NEGATIVE 07/31/2020  ?  ?   ?Recent Labs  ?

## 2021-07-11 NOTE — Subjective & Objective (Signed)
Pt was found at the facility in respiratory distress with O2 sats down to 60% on Ra ?Had [place] in January 2023 and has been in [facility] and Rehab  ?Reports he has been more short of breath for the past few months ?No CP no fever ?Leg swelling for the past 3 months ?No NVD ?

## 2021-07-11 NOTE — Assessment & Plan Note (Signed)
On aspirin and was on Plavix now unsure if still taking the plan was to continue Plavix until mid April 2023 but pt has had increased nose bleeds ?Will hold for tonight and will need further eval ?

## 2021-07-11 NOTE — Assessment & Plan Note (Signed)
Soft bp will allow permissive HTN ?

## 2021-07-11 NOTE — Assessment & Plan Note (Signed)
Order sliding scale and monitor BG ?

## 2021-07-11 NOTE — Assessment & Plan Note (Signed)
Chronic down from prior, in the setting of CKD ?

## 2021-07-11 NOTE — ED Provider Notes (Signed)
?[place] [facility] ?Provider Note ? ? ?CSN: [id number] ?Arrival date & time: 07/11/21  1603 ? ?  ? ?History ? ?Chief Complaint  ?Patient presents with  ? Shortness of Breath  ? ? ?Kevin Schwartz is a 88 y.o. male. ? ?HPI ? ?  ? ?88 year old male with history of coronary artery disease with stenting on Plavix and Eliquis regiment, chronic systolic CHF with an ejection fraction of 45 to 50%, CKD stage III, hypertension, hyperlipidemia, BPH, type 2 diabetes, admission in January for CHF and COVID pneumonia who presents from [facility] for respiratory distress.  ? ?Reports dyspnea for 2 months ?Will normally feel dyspnea with PT but today they found him to have low oxygen levels ?No chest pain ?Not sure if fever, havent felt like had one, has felt cold ?Leg swelling for the last 3 months, feels like it is better ?Has chronic dyspnea, feels short of breath now at rest ?No nausea, vomiting, black or bloody stools ? ? ? ?Past Medical History:  ?Diagnosis Date  ? Anemia due to chronic kidney disease   ? Bilateral inguinal hernia   ? CAD (coronary artery disease) cardiologist-  dr bensimhon  ? PTCA of OM in 1998, BMS OM in 2000, Cath 9/07 LM ok LAD ok. LCX 95% in OM prior to previous stent RCA. nondominant normal EF normal. [place] DES to OM 2007 (PLACED PROXIAMAL TO PREVIOUS STENT)  ? Chronic kidney disease, stage III (moderate) (HCC)   ? nephrologist-  dr detarding  ? Depression   ? Diabetes mellitus type 2, diet-controlled ([place])   ? followed by pcp,  last A1c 5.2 on 09-10-2017 in epic  ? Gout, unspecified   ? 10-29-2017  per pt stable  ? HLD (hyperlipidemia)   ? HTN (hypertension)   ? MGUS (monoclonal gammopathy of unknown significance) previously followed by dr Beryle Beams , [facility] and released 08/01/2011  ? IgG kappa dx 2002 8% plasma cells in bone marrow; no lesions on bone X-rays;  ? Nocturia   ? OA (osteoarthritis)   ? OSA on CPAP   ? per study 01-30-2004  severe osa ,  AHI 51.6/hr  ? PAD (peripheral artery disease) ([place])   ? 05-21-2006  left renal artery stenosis, s/p balloon angioplasty and stenting;  last duplex 02/ 2012  normal , arteries patent  ? S/P coronary artery stent placement   ? 2000--  BMS x1  to OM;   2007-- DES x1  to OM proximal to previous stent  ? Secondary hyperparathyroidism of renal origin [facility])   ? Urgency of urination   ? Wears glasses   ?  ? ?Home Medications ?Prior to Admission medications   ?Medication Sig Start Date End Date Taking? Authorizing Provider  ?acetaminophen (TYLENOL) 500 MG tablet Take 1,000 mg by mouth every 8 (eight) hours as needed for moderate pain.   Yes [provider]  ?allopurinol (ZYLOPRIM) 100 MG tablet Take 1 tablet (100 mg total) by mouth daily. 01/31/21  Yes [name], MD  ?apixaban (ELIQUIS) 2.5 MG TABS tablet Take 1 tablet (2.5 mg total) by mouth 2 (two) times daily. 04/28/21  Yes Elgergawy, Silver Huguenin, MD  ?aspirin EC 81 MG tablet Take 81 mg by mouth daily. Swallow whole.   Yes [provider]  ?atorvastatin (LIPITOR) 80 MG tablet Take 1 tablet (80 mg total) by mouth daily. ?Patient taking differently: Take 80 mg by mouth at bedtime. 01/31/21 07/11/21 Yes Ngetich, Dinah C, NP  ?  carvedilol (COREG) 3.125 MG tablet Take 3.125 mg by mouth 2 (two) times daily. Do not give if SBP <110 or HR <60 06/19/21  Yes [provider]  ?cetirizine (ZYRTEC) 10 MG tablet Take 10 mg by mouth at bedtime.   Yes [provider]  ?citalopram (CELEXA) 20 MG tablet Take 1 tablet (20 mg total) by mouth daily. 01/31/21  Yes [name], MD  ?diphenhydrAMINE (BENADRYL) 25 MG tablet Take 25 mg by mouth 2 (two) times daily as needed for itching.   Yes [provider]  ?Emollient (EUCERIN) lotion Apply 1 application. topically daily. Apply to rash on  chest, abdomen, back,neck and shoulders   Yes [provider]  ?ezetimibe (ZETIA) 10 MG tablet Take 10 mg by mouth daily.   Yes [provider]  ?furosemide (LASIX) 20 MG tablet TAKE 1 TABLET BY MOUTH EVERY DAY ?Patient taking differently: Take 10 mg by mouth daily. 03/20/21  Yes [name], MD  ?guaiFENesin (MUCINEX) 600 MG 12 hr tablet Take 600 mg by mouth 2 (two) times daily.   Yes [provider]  ?ipratropium-albuterol (DUONEB) 0.5-2.5 (3) MG/3ML SOLN Inhale 3 mLs into the lungs 3 (three) times daily. 06/17/21  Yes [provider]  ?lidocaine (LIDODERM) 5 % Place 1 patch onto the skin daily. Remove & Discard patch within 12 hours or as directed by MD ?Patient taking differently: Place 1 patch onto the skin See admin instructions. Apply to left knee in the morning,remove at bedtime 04/01/20  Yes Maudie Flakes, MD  ?nitroGLYCERIN (NITROSTAT) 0.4 MG SL tablet DISSOLVE 1 TABLET UNDER THE TONGUE EVERY 5 MINUTES AS  NEEDED FOR CHEST PAIN. MAX  OF 3 TABLETS IN 15 MINUTES. CALL 911 IF PAIN PERSISTS. ?Patient taking differently: Place 0.4 mg under the tongue every 5 (five) minutes as needed for chest pain. 11/08/20  Yes [name], MD  ?spironolactone (ALDACTONE) 25 MG tablet Take 12.5 mg by mouth every other day. Do no give if SBP <110 06/19/21  Yes [provider]  ?tamsulosin (FLOMAX) 0.4 MG CAPS capsule Take 0.4 mg by mouth daily. 06/24/21  Yes [provider]  ?white petrolatum (VASELINE) GEL Apply 1 application. topically 3 (three) times daily as needed (inside bilateral nares).   Yes [provider]  ?Zinc Oxide 25 % PSTE Apply 1 application. topically in the morning and at bedtime. Apply to bottom during incontinence care   Yes [provider]  ?ACCU-CHEK SOFTCLIX LANCETS lancets Use to test blood sugar daily. Dx:E11.8 03/25/17   Hollace Kinnier L, DO  ?Alcohol Swabs (B-D SINGLE USE SWABS REGULAR) PADS Use with testing of blood sugar. Dx:E11.8 03/25/17   Reed, Tiffany L, DO  ?clopidogrel (PLAVIX) 75 MG tablet Take 1 tablet (75 mg total) by mouth daily with breakfast. ?Patient  not taking: Reported on 07/11/2021 08/04/20 07/30/21  Kayleen Memos, DO  ?glucose blood (ACCU-CHEK AVIVA PLUS) test strip Use to check blood sugar daily. Dx:E11.8 09/05/19   Hollace Kinnier L, DO  ?   ? ?Allergies    ?Lisinopril, Losartan potassium, Crestor [rosuvastatin], and Tape   ? ?Review of Systems   ?Review of Systems ? ?Physical Exam ?Updated Vital Signs ?BP 125/73 (BP Location: Right Arm)   Pulse 88   Temp 97.7 ?F (36.5 ?C) (Oral)   Resp 16   Ht '5\' 7"'$  (1.702 m)   Wt 82.6 kg   SpO2 97%   BMI 28.52 kg/m?  ?Physical Exam ?Vitals and nursing  note reviewed.  ?Constitutional:   ?   General: He is not in acute distress. ?   Appearance: He is well-developed. He is not diaphoretic.  ?HENT:  ?   Head: Normocephalic and atraumatic.  ?Eyes:  ?   Conjunctiva/sclera: Conjunctivae normal.  ?Cardiovascular:  ?   Rate and Rhythm: Normal rate and regular rhythm.  ?   Heart sounds: Normal heart sounds. No murmur heard. ?  No friction rub. No gallop.  ?Pulmonary:  ?   Effort: Pulmonary effort is normal. Tachypnea present. No respiratory distress.  ?   Breath sounds: Normal breath sounds. No wheezing or rales.  ?Abdominal:  ?   General: There is no distension.  ?   Palpations: Abdomen is soft.  ?   Tenderness: There is no abdominal tenderness. There is no guarding.  ?Musculoskeletal:  ?   Cervical back: Normal range of motion.  ?   Right lower leg: Edema present.  ?   Left lower leg: Edema present.  ?Skin: ?   General: Skin is warm and dry.  ?Neurological:  ?   Mental Status: He is alert and oriented to person, place, and time.  ? ? ?ED Results / Procedures / Treatments   ?Labs ?(all labs ordered are listed, but only abnormal results are displayed) ?Labs Reviewed  ?CBC WITH DIFFERENTIAL/PLATELET - Abnormal; Notable for the following components:  ?    Result Value  ? RBC 3.45 (*)   ? Hemoglobin 10.4 (*)   ? HCT 32.8 (*)   ? RDW 18.6 (*)   ? Eosinophils Absolute 0.8 (*)   ? All other components within normal limits   ?COMPREHENSIVE METABOLIC PANEL - Abnormal; Notable for the following components:  ? BUN 35 (*)   ? Creatinine, Ser 2.00 (*)   ? Calcium 8.4 (*)   ? Albumin 2.8 (*)   ? GFR, Estimated 32 (*)   ? All other components within normal l

## 2021-07-11 NOTE — Assessment & Plan Note (Signed)
?-   Pt diagnosed with CHF based on presence of the following:  OA, rales on exam,     Pulmonary edema on CXR, and   bilateral leg edema, DOE  hepatomegaly, pleural effusion ? With noted response to IV diuretic in ER ? ?admit on telemetry,  ?cycle cardiac enzymes, Troponin 54 - 52 ?Invalid input(s): TROP ? ?obtain serial ECG  to evaluate for ischemia as a cause of heart failure ? monitor daily weight:  ?Filed Weights  ? 07/11/21 1615  ?Weight: 82.6 kg  ? ?Last BNP BNP (last 3 results) ?Recent Labs  ?  02/26/21 ?1002 04/19/21 ?0214 07/11/21 ?1828  ?BNP 1,279* 427.6* 1,590.3*  ? ?  ? diurese with IV lasix and monitor orthostatics and creatinine to avoid over diuresis. ? Order echogram to evaluate EF and valves ? ACE/ARBi Contraindicated  ?  cardiology  emailed ? ?

## 2021-07-11 NOTE — Assessment & Plan Note (Signed)
chronic stable ?

## 2021-07-11 NOTE — Assessment & Plan Note (Signed)
On eliquis will continue does not tolerate betablockers in the past ?

## 2021-07-11 NOTE — Assessment & Plan Note (Signed)
Noted while at the facility now appears not on O2 unsure if was an error ?Pt continues to have some wheezing states better with breathing treatment ?Will give albuterol prn and schedule Duoneb ?Pt in the past was on O2 as needed ? ?

## 2021-07-11 NOTE — Assessment & Plan Note (Addendum)
-  chronic avoid nephrotoxic medications such as NSAIDs, Vanco Zosyn combo,  avoid hypotension, continue to follow renal function ?Cr somewhat up from baseline will continue to monitor ?After have received some lasix ?

## 2021-07-11 NOTE — ED Triage Notes (Signed)
Pt arrived via [facility] from [facility] for respiratory distress after PT. Per EMS, staff said pt's Sa02 was 69% on RA. Per EMS, they were having trouble getting a good read on the Sa02. Per EMS, lungs are diminished. Pt is A&Ox4.  ?

## 2021-07-11 NOTE — Assessment & Plan Note (Addendum)
Pressure woud of the left noted ?Order wound consult monitor for any sign of infection ?

## 2021-07-11 NOTE — Assessment & Plan Note (Signed)
In the past had trouble tolerating statins ?

## 2021-07-12 ENCOUNTER — Other Ambulatory Visit (HOSPITAL_COMMUNITY): Payer: Self-pay

## 2021-07-12 ENCOUNTER — Inpatient Hospital Stay (HOSPITAL_COMMUNITY): Payer: Medicare HMO

## 2021-07-12 DIAGNOSIS — I5021 Acute systolic (congestive) heart failure: Secondary | ICD-10-CM

## 2021-07-12 DIAGNOSIS — I5043 Acute on chronic combined systolic (congestive) and diastolic (congestive) heart failure: Secondary | ICD-10-CM | POA: Diagnosis not present

## 2021-07-12 LAB — ECHOCARDIOGRAM COMPLETE
AR max vel: 1.72 cm2
AV Area VTI: 1.72 cm2
AV Area mean vel: 1.51 cm2
AV Mean grad: 4 mmHg
AV Peak grad: 7.3 mmHg
Ao pk vel: 1.35 m/s
Calc EF: 52.4 %
Height: 68 in
MV M vel: 4.51 m/s
MV Peak grad: 81.4 mmHg
P 1/2 time: 440 msec
Radius: 0.5 cm
S' Lateral: 3.6 cm
Single Plane A2C EF: 56.3 %
Single Plane A4C EF: 49.1 %
Weight: 2920.65 oz

## 2021-07-12 LAB — COMPREHENSIVE METABOLIC PANEL
ALT: 15 U/L (ref 0–44)
AST: 30 U/L (ref 15–41)
Albumin: 2.4 g/dL — ABNORMAL LOW (ref 3.5–5.0)
Alkaline Phosphatase: 67 U/L (ref 38–126)
Anion gap: 7 (ref 5–15)
BUN: 36 mg/dL — ABNORMAL HIGH (ref 8–23)
CO2: 22 mmol/L (ref 22–32)
Calcium: 8 mg/dL — ABNORMAL LOW (ref 8.9–10.3)
Chloride: 108 mmol/L (ref 98–111)
Creatinine, Ser: 1.96 mg/dL — ABNORMAL HIGH (ref 0.61–1.24)
GFR, Estimated: 32 mL/min — ABNORMAL LOW (ref >60)
Glucose, Bld: 87 mg/dL (ref 70–99)
Potassium: 4.1 mmol/L (ref 3.5–5.1)
Sodium: 137 mmol/L (ref 135–145)
Total Bilirubin: 0.8 mg/dL (ref 0.3–1.2)
Total Protein: 5.9 g/dL — ABNORMAL LOW (ref 6.5–8.1)

## 2021-07-12 LAB — CBC WITH DIFFERENTIAL/PLATELET
Abs Immature Granulocytes: 0.02 10*3/uL (ref 0.00–0.07)
Basophils Absolute: 0 10*3/uL (ref 0.0–0.1)
Basophils Relative: 1 %
Eosinophils Absolute: 0.7 10*3/uL — ABNORMAL HIGH (ref 0.0–0.5)
Eosinophils Relative: 14 %
HCT: 27.1 % — ABNORMAL LOW (ref 39.0–52.0)
Hemoglobin: 8.8 g/dL — ABNORMAL LOW (ref 13.0–17.0)
Immature Granulocytes: 0 %
Lymphocytes Relative: 13 %
Lymphs Abs: 0.6 10*3/uL — ABNORMAL LOW (ref 0.7–4.0)
MCH: 30.3 pg (ref 26.0–34.0)
MCHC: 32.5 g/dL (ref 30.0–36.0)
MCV: 93.4 fL (ref 80.0–100.0)
Monocytes Absolute: 0.4 10*3/uL (ref 0.1–1.0)
Monocytes Relative: 9 %
Neutro Abs: 3 10*3/uL (ref 1.7–7.7)
Neutrophils Relative %: 63 %
Platelets: 297 10*3/uL (ref 150–400)
RBC: 2.9 MIL/uL — ABNORMAL LOW (ref 4.22–5.81)
RDW: 18.6 % — ABNORMAL HIGH (ref 11.5–15.5)
WBC: 4.7 10*3/uL (ref 4.0–10.5)
nRBC: 0 % (ref 0.0–0.2)

## 2021-07-12 LAB — IRON AND TIBC
Iron: 26 ug/dL — ABNORMAL LOW (ref 45–182)
Saturation Ratios: 9 % — ABNORMAL LOW (ref 17.9–39.5)
TIBC: 274 ug/dL (ref 250–450)
UIBC: 248 ug/dL

## 2021-07-12 LAB — VITAMIN B12: Vitamin B-12: 524 pg/mL (ref 180–914)

## 2021-07-12 LAB — FERRITIN: Ferritin: 72 ng/mL (ref 24–336)

## 2021-07-12 LAB — TSH
TSH: 36.181 u[IU]/mL — ABNORMAL HIGH (ref 0.350–4.500)
TSH: 35.246 u[IU]/mL — ABNORMAL HIGH (ref 0.350–4.500)

## 2021-07-12 LAB — RETICULOCYTES
Immature Retic Fract: 10.2 % (ref 2.3–15.9)
RBC.: 3.26 MIL/uL — ABNORMAL LOW (ref 4.22–5.81)
Retic Count, Absolute: 38.5 10*3/uL (ref 19.0–186.0)
Retic Ct Pct: 1.2 % (ref 0.4–3.1)

## 2021-07-12 LAB — GLUCOSE, CAPILLARY
Glucose-Capillary: 64 mg/dL — ABNORMAL LOW (ref 70–99)
Glucose-Capillary: 87 mg/dL (ref 70–99)
Glucose-Capillary: 80 mg/dL (ref 70–99)

## 2021-07-12 LAB — FOLATE: Folate: 8 ng/mL (ref >5.9)

## 2021-07-12 LAB — MAGNESIUM: Magnesium: 1.7 mg/dL (ref 1.7–2.4)

## 2021-07-12 LAB — CBG MONITORING, ED
Glucose-Capillary: 73 mg/dL (ref 70–99)
Glucose-Capillary: 79 mg/dL (ref 70–99)

## 2021-07-12 LAB — PHOSPHORUS: Phosphorus: 3 mg/dL (ref 2.5–4.6)

## 2021-07-12 LAB — MRSA NEXT GEN BY PCR, NASAL: MRSA by PCR Next Gen: NOT DETECTED

## 2021-07-12 MED ORDER — ADULT MULTIVITAMIN W/MINERALS CH
1.0000 | ORAL_TABLET | Freq: Every day | ORAL | Status: DC
  Administered 2021-07-12 – 2021-07-19 (×8): 1 via ORAL
  Filled 2021-07-12 (×8): qty 1

## 2021-07-12 MED ORDER — SODIUM CHLORIDE 0.9 % IV SOLN
250.0000 mg | Freq: Every day | INTRAVENOUS | Status: AC
  Administered 2021-07-12 – 2021-07-15 (×4): 250 mg via INTRAVENOUS
  Filled 2021-07-12 (×4): qty 20

## 2021-07-12 MED ORDER — ENSURE ENLIVE PO LIQD
237.0000 mL | Freq: Two times a day (BID) | ORAL | Status: DC
  Administered 2021-07-13 – 2021-07-19 (×12): 237 mL via ORAL

## 2021-07-12 MED ORDER — FUROSEMIDE 10 MG/ML IJ SOLN
60.0000 mg | Freq: Two times a day (BID) | INTRAMUSCULAR | Status: DC
  Administered 2021-07-12 – 2021-07-13 (×3): 60 mg via INTRAVENOUS
  Filled 2021-07-12 (×3): qty 6

## 2021-07-12 MED ORDER — INSULIN ASPART 100 UNIT/ML IJ SOLN
0.0000 [IU] | Freq: Three times a day (TID) | INTRAMUSCULAR | Status: DC
  Administered 2021-07-16 – 2021-07-18 (×3): 1 [IU] via SUBCUTANEOUS

## 2021-07-12 MED ORDER — DAPAGLIFLOZIN PROPANEDIOL 10 MG PO TABS
10.0000 mg | ORAL_TABLET | Freq: Every day | ORAL | Status: DC
  Administered 2021-07-12 – 2021-07-19 (×8): 10 mg via ORAL
  Filled 2021-07-12 (×2): qty 2
  Filled 2021-07-12 (×2): qty 1
  Filled 2021-07-12 (×6): qty 2

## 2021-07-12 NOTE — Evaluation (Signed)
Physical Therapy Evaluation ?Patient Details ?Name: Kevin Schwartz ?MRN: [id number] ?DOB: 01-Jun-1930 ?Today's Date: 07/12/2021 ? ?History of Present Illness ? Pt is an 88 yo M presenting to [facility] ED on 07/11/21 from SNF rehab due to respiratory distress with SpO2 at 69% on RA. Imaging (+) for mild bibasilar atelectasis or infiltrate. Small associated right pleural effusion. Presents with L foot wound. PMH includes CHF, CAD s/p stenting on Eliquis and Plavix, CKD, HTN, DM2, and BPH.  ?Clinical Impression ? Patient presented to the ED on 07/11/21 with shortness of breath consistent with patient's diagnosis of CHF exacerbation. Pt's impairments include decreased cardiopulmonary conditioning, strength, and balance. These impairments are limiting his ability to safely and independently transfer, ambulate, and navigate stairs. Patient requires mod Ax2 to max Ax2 with all functional mobility. Pt had limited hip clearance with attempt to power up to standing and was limited by L heel pain. He deferred further attempts for mobility. Unable to get accurate SpO2 reading despite efforts to try new monitor locations. Pt symptoms monitored closely. SPT recommending SNF upon D/C due to mobility deficits. PT will continue to follow acutely to maximize pt's safety and independence with functional mobility. ?   ?   ? ?Recommendations for follow up therapy are one component of a multi-disciplinary discharge planning process, led by the attending physician.  Recommendations may be updated based on patient status, additional functional criteria and insurance authorization. ? ?Follow Up Recommendations Skilled nursing-short term rehab (<3 hours/day) ? ?  ?Assistance Recommended at Discharge Frequent or constant Supervision/Assistance  ?Patient can return home with the following ? Two people to help with walking and/or transfers;Two people to help with bathing/dressing/bathroom;Assist for transportation;Help with stairs or ramp for  entrance;Assistance with cooking/housework ? ?  ?Equipment Recommendations Wheelchair (measurements PT);Wheelchair cushion (measurements PT)  ?Recommendations for Other Services ?    ?  ?Functional Status Assessment Patient has had a recent decline in their functional status and demonstrates the ability to make significant improvements in function in a reasonable and predictable amount of time.  ? ?  ?Precautions / Restrictions Precautions ?Precautions: Fall ?Precaution Comments: watch SpO2 ?Restrictions ?Weight Bearing Restrictions: No  ? ?  ? ?Mobility ? Bed Mobility ?Overal bed mobility: Needs Assistance ?Bed Mobility: Supine to Sit, Sit to Supine ?  ?  ?Supine to sit: Mod assist, +2 for physical assistance, +2 for safety/equipment, HOB elevated ?Sit to supine: Max assist, +2 for physical assistance, +2 for safety/equipment ?  ?General bed mobility comments: Pt SpO2 monitor not reading during session, despite efforts to adjust and apply new monitors. Pt required mod Ax2 for elevation of trunk, LE management, and scooting to EOB. Pt had slight increase in labored breathing and wheezing with sitting that was monitored closely. Pt required max Ax2 for sit <> supine. ?  ? ?Transfers ?Overall transfer level: Needs assistance ?Equipment used: 2 person hand held assist ?Transfers: Sit to/from Stand ?Sit to Stand: Mod assist, +2 physical assistance, +2 safety/equipment, From elevated surface ?  ?  ?  ?  ?  ?General transfer comment: Pt attempted sit <> stand transfer with mod Ax2, 2 person HHA. After initiating movement, pt deferred to continue due to severe pain in L heel. Limited hip clearance was made with attempt. Further mobility deferred due to pain. No increase in wheezing or labored breathing with attempt. ?  ? ?Ambulation/Gait ?  ?  ?  ?  ?  ?  ?  ?  ? ?Stairs ?  ?  ?  ?  ?  ? ?  Wheelchair Mobility ?  ? ?Modified Rankin (Stroke Patients Only) ?  ? ?  ? ?Balance Overall balance assessment: Needs  assistance ?Sitting-balance support: Bilateral upper extremity supported, Feet unsupported ?Sitting balance-[name]: Poor ?Sitting balance - Comments: pt required BUE support in sitting with close min guard for safety ?  ?  ?  ?  ?  ?  ?  ?  ?  ?  ?  ?  ?  ?  ?  ?   ? ? ? ?Pertinent Vitals/Pain Pain Assessment ?Pain Assessment: Faces ?Faces Pain Scale: Hurts even more ?Pain Location: L heel ?Pain Descriptors / Indicators: Discomfort, Grimacing, Moaning ?Pain Intervention(s): Limited activity within patient's tolerance, Monitored during session  ? ? ?Home Living Family/patient expects to be discharged to:: Skilled nursing facility ?  ?  ?  ?  ?  ?  ?  ?  ?  ?Additional Comments: pt expected to return to SNF for rehab  ?  ?Prior Function Prior Level of Function : Needs assist ?  ?  ?  ?Physical Assist : Mobility (physical) ?Mobility (physical): Bed mobility;Transfers;Gait ?  ?Mobility Comments: uses wheelchair mostly, however, has been working on ambulating short distances with RW and wheelchair follow at SNF ?ADLs Comments: pt requires help dressing and bathing at SNF ?  ? ? ?Hand Dominance  ? Dominant Hand: Right ? ?  ?Extremity/Trunk Assessment  ? Upper Extremity Assessment ?Upper Extremity Assessment: Generalized weakness ?  ? ?Lower Extremity Assessment ?Lower Extremity Assessment: Generalized weakness;LLE deficits/detail ?LLE Deficits / Details: L wound on heel ?  ? ?Cervical / Trunk Assessment ?Cervical / Trunk Assessment: Kyphotic  ?Communication  ? Communication: HOH  ?Cognition Arousal/Alertness: Awake/alert ?Behavior During Therapy: [facility] for tasks assessed/performed ?Overall Cognitive Status: Within Functional Limits for tasks assessed ?  ?  ?  ?  ?  ?  ?  ?  ?  ?  ?  ?  ?  ?  ?  ?  ?  ?  ?  ? ?  ?General Comments General comments (skin integrity, edema, etc.): Unable to get an accurate reading with SpO2 monitor despite efforts change monitor location. ? ?  ?Exercises    ? ?Assessment/Plan  ?  ?PT  Assessment Patient needs continued PT services  ?PT Problem List Decreased strength;Decreased activity tolerance;Decreased balance;Decreased mobility;Decreased coordination;Pain ? ?   ?  ?PT Treatment Interventions DME instruction;Gait training;Functional mobility training;Therapeutic activities;Therapeutic exercise;Balance training;Neuromuscular re-education;Patient/family education;Wheelchair mobility training   ? ?PT Goals (Current goals can be found in the Care Plan section)  ?Acute Rehab PT Goals ?Patient Stated Goal: to return to SNF ?PT Goal Formulation: With patient ?Time For Goal Achievement: 07/26/21 ?Potential to Achieve Goals: Good ? ?  ?Frequency Min 2X/week ?  ? ? ?Co-evaluation   ?  ?  ?  ?  ? ? ?  ?AM-PAC PT "6 Clicks" Mobility  ?Outcome Measure Help needed turning from your back to your side while in a flat bed without using bedrails?: A Little ?Help needed moving from lying on your back to sitting on the side of a flat bed without using bedrails?: Total ?Help needed moving to and from a bed to a chair (including a wheelchair)?: Total ?Help needed standing up from a chair using your arms (e.g., wheelchair or bedside chair)?: Total ?Help needed to walk in hospital room?: Total ?Help needed climbing 3-5 steps with a railing? : Total ?6 Click Score: 8 ? ?  ?End of Session Equipment Utilized During Treatment: Gait  belt ?Activity Tolerance: Patient limited by pain ?Patient left: in bed;with call bell/phone within reach;Other (comment) (on stretcher in ED) ?Nurse Communication: Mobility status ?PT Visit Diagnosis: Unsteadiness on feet (R26.81);Other abnormalities of gait and mobility (R26.89);Pain ?Pain - Right/Left: Left ?Pain - part of body:  (heel) ?  ? ?Time: 1884-1660 ?PT Time Calculation (min) (ACUTE ONLY): 22 min ? ? ?Charges:   PT Evaluation ?$PT Eval Moderate Complexity: 1 Mod ?  ?  ?   ? ? ?Jonne Ply, SPT ? ?Jonne Ply ?07/12/2021, 2:19 PM ? ?

## 2021-07-12 NOTE — Progress Notes (Signed)
?  Echocardiogram ?2D Echocardiogram has been performed. ? ?Kevin Schwartz ?07/12/2021, 2:50 PM ?

## 2021-07-12 NOTE — Progress Notes (Signed)
Speech Language Pathology Treatment: Dysphagia  ?Patient Details ?Name: Kevin Schwartz ?MRN: [id number] ?DOB: Aug 02, 1930 ?Today's Date: 07/12/2021 ?Time: 4098-1191 ?SLP Time Calculation (min) (ACUTE ONLY): 13 min ? ?Assessment / Plan / Recommendation ?Clinical Impression ? Post eval while at nurses station, overheard pt with strong coughing and observed he was consuming am meal. Pt noted to take large bites and sips when self-feeding and x1 strong overt coughing exhibited. Provided education and training in small bites/sips with both verbal/visual cues provided throughout intake of puree, mech soft and regular solids with thin liquids via straw. Given min cueing, pt utilized above strategies with 75% accuracy with a notable reduction in s/sx. Belching continues. Several minutes post intake, pt noted with congested cough, but suspect no relation to POs. Post session, RN gave meds and reported that pt took them all whole at one time with liquid and no s/sx of aspiration were observed. Continue current diet with continued SLP f/u.  ?  ?HPI HPI: Pt is a 88 y.o. male who presented with shortness of breath. Pt was found at facility in respiratory distress with O2 sats down to 60% on Ra. Had [place] in January 2023 and has been in [facility]. Reports he has been more short of breath for the past few months. CXR 3/30 revealed "Mild bibasilar atelectasis or infiltrate. Small associated right pleural effusion". "Less likely infiltrate...no pneumonia symptoms at this time" per MD note 3/30. PMH: CAD with stenting on Plavix and Eliquis regimen, chronic systolic CHF LVEF 47-82%, CKD stage IIIa, HTN, HLD, BPH,  DM2. ?  ?   ?SLP Plan ? Continue with current plan of care ? ?  ?  ?Recommendations for follow up therapy are one component of a multi-disciplinary discharge planning process, led by the attending physician.  Recommendations may be updated based on patient status, additional functional criteria and  insurance authorization. ?  ? ?Recommendations  ?Diet recommendations: Regular;Thin liquid ?Liquids provided via: Cup;Straw ?Medication Administration: Whole meds with liquid ?Supervision: Patient able to self feed ?Compensations: Minimize environmental distractions;Slow rate;Small sips/bites ?Postural Changes and/or Swallow Maneuvers: Seated upright 90 degrees;Upright 30-60 min after meal  ?   ?    ?   ? ? ? ? Oral Care Recommendations: Oral care BID ?Follow Up Recommendations: Other (comment) (TBD) ?Assistance recommended at discharge: Intermittent Supervision/Assistance ?SLP Visit Diagnosis: Dysphagia, unspecified (R13.10) ?Plan: Continue with current plan of care ? ? ? ? ?  ?  ? ?Kevin Dense, MA, CCC-SLP ?Acute Rehabilitation Services ?Office Number: [phone] ? ?Kevin Schwartz ? ?07/12/2021, 10:50 AM ?

## 2021-07-12 NOTE — TOC Benefit Eligibility Note (Signed)
Patient Advocate Encounter ? ?Insurance verification completed.   ? ?The patient is currently admitted and upon discharge could be taking Farxiga 10 mg. ? ?The current 30 day co-pay is, $0.00.  ? ?The patient is insured through [employer] Part D  ? ? ?Lyndel Safe, CPhT ?Pharmacy Patient Advocate Specialist ?[place] Patient Advocate Team ?Direct Number: [phone]  Fax: [phone] ? ? ? ? ? ?  ?

## 2021-07-12 NOTE — Evaluation (Signed)
Clinical/Bedside Swallow Evaluation ?Patient Details  ?Name: Kevin Schwartz ?MRN: [id number] ?Date of Birth: 1930-12-27 ? ?Today's Date: 07/12/2021 ?Time: SLP Start Time (ACUTE ONLY): [id number] SLP Stop Time (ACUTE ONLY): 1019 ?SLP Time Calculation (min) (ACUTE ONLY): 30 min ? ?Past Medical History:  ?Past Medical History:  ?Diagnosis Date  ? Anemia due to chronic kidney disease   ? Bilateral inguinal hernia   ? CAD (coronary artery disease) cardiologist-  dr bensimhon  ? PTCA of OM in 1998, BMS OM in 2000, Cath 9/07 LM ok LAD ok. LCX 95% in OM prior to previous stent RCA. nondominant normal EF normal. [place] DES to OM 2007 (PLACED PROXIAMAL TO PREVIOUS STENT)  ? Chronic kidney disease, stage III (moderate) (HCC)   ? nephrologist-  dr detarding  ? Depression   ? Diabetes mellitus type 2, diet-controlled ([place])   ? followed by pcp,  last A1c 5.2 on 09-10-2017 in epic  ? Gout, unspecified   ? 10-29-2017  per pt stable  ? HLD (hyperlipidemia)   ? HTN (hypertension)   ? MGUS (monoclonal gammopathy of unknown significance) previously followed by dr Beryle Beams , [facility] and released 08/01/2011  ? IgG kappa dx 2002 8% plasma cells in bone marrow; no lesions on bone X-rays;  ? Nocturia   ? OA (osteoarthritis)   ? OSA on CPAP   ? per study 01-30-2004  severe osa , AHI 51.6/hr  ? PAD (peripheral artery disease) ([place])   ? 05-21-2006  left renal artery stenosis, s/p balloon angioplasty and stenting;  last duplex 02/ 2012  normal , arteries patent  ? S/P coronary artery stent placement   ? 2000--  BMS x1  to OM;   2007-- DES x1  to OM proximal to previous stent  ? Secondary hyperparathyroidism of renal origin [facility])   ? Urgency of urination   ? Wears glasses   ? ?Past Surgical History:  ?Past Surgical History:  ?Procedure Laterality Date  ? CARDIOVASCULAR STRESS TEST  2010  ? per dr bensimhon epic note dated 04-18-2016  normal  ? [place]  ? PTCA to OM  ? CORONARY ANGIOPLASTY WITH STENT PLACEMENT  2000  ? PCI and BMS  x1 OM  ? CORONARY ANGIOPLASTY WITH STENT PLACEMENT  12-18-2005  dr Claiborne Billings  ? DES x1 to proximal to previously placed stent in OM  ? CORONARY BALLOON ANGIOPLASTY N/A 08/02/2020  ? Procedure: CORONARY BALLOON ANGIOPLASTY;  Surgeon: Troy Sine, MD;  Location: [place] CV [facility];  Service: Cardiovascular;  Laterality: N/A;  ? CORONARY STENT INTERVENTION N/A 08/02/2020  ? Procedure: CORONARY STENT INTERVENTION;  Surgeon: Troy Sine, MD;  Location: [place] [facility];  Service: Cardiovascular;  Laterality: N/A;  ? HERNIA REPAIR    ? INGUINAL HERNIA REPAIR Bilateral 10/30/2017  ? Procedure: LAPAROSCOPIC  BILATERAL INGUINAL HERNIA REPAIR  AND LEFT FEMERAL HERNIA REPAIR;  Surgeon: Michael Boston, MD;  Location: [place];  Service: General;  Laterality: Bilateral;  ? INSERTION OF MESH Bilateral 10/30/2017  ? Procedure: INSERTION OF MESH;  Surgeon: Michael Boston, MD;  Location: [facility];  Service: General;  Laterality: Bilateral;  ? LEFT HEART CATH AND CORONARY ANGIOGRAPHY N/A 08/01/2020  ? Procedure: LEFT HEART CATH AND CORONARY ANGIOGRAPHY;  Surgeon: Troy Sine, MD;  Location: [place] [facility];  Service: Cardiovascular;  Laterality: N/A;  ? RENAL ANGIOPLASTY Left 05-21-2006   dr berry  ? and stenting  ? TOTAL KNEE ARTHROPLASTY Left 07-22-2005  dr Shellia Carwin  [facility]  ? TRANSTHORACIC ECHOCARDIOGRAM  04/18/2016  ? ef 53-97%, grade 1 diastolic dysfunction/  mild to moderate AR/  mild MR  ? ?HPI:  ?Pt is a 88 y.o. male who presented with shortness of breath. Pt was found at facility in respiratory distress with O2 sats down to 60% on Ra. Had [place] in January 2023 and has been in [facility]. Reports he has been more short of breath for the past few months. CXR 3/30 revealed "Mild bibasilar atelectasis or infiltrate. Small associated right pleural effusion". "Less likely infiltrate...no pneumonia symptoms at this time" per MD note 3/30. PMH: CAD with stenting on Plavix and  Eliquis regimen, chronic systolic CHF LVEF 67-34%, CKD stage IIIa, HTN, HLD, BPH,  DM2.  ?  ?Assessment / Plan / Recommendation  ?Clinical Impression ? Pt seen for bedside swallow eval alert and repositioned upright in bed. Oral mechanism examination unremarkable, except for some missing dentition. Across all POs, belching and intermittent throat clearing present. Large volumes of thin liquids via straw and bites of puree/regular textures resulted in delayed coughing. Small sips and bites resulted in occasional throat clear but overall reduction in s/sx. Question presentation this date and relation to POs given inconsistency. Recommend Regular, thin liquid diet with supervision for small bites/sips, slow rate and upright positioning. Given frequent belching, may be beneficial to have GI f/u as there may be an esophageal component. SLP to f/u for tolerance. ? ?SLP Visit Diagnosis: Dysphagia, unspecified (R13.10) ?   ?Aspiration Risk ? Mild aspiration risk;Moderate aspiration risk  ?  ?Diet Recommendation Regular;Thin liquid  ? ?Liquid Administration via: Cup;Straw ?Medication Administration: Whole meds with liquid ?Compensations: Minimize environmental distractions;Slow rate;Small sips/bites ?Postural Changes: Seated upright at 90 degrees;Remain upright for at least 30 minutes after po intake  ?  ?Other  Recommendations Recommended Consults: Consider GI evaluation ?Oral Care Recommendations: Oral care BID   ? ?Recommendations for follow up therapy are one component of a multi-disciplinary discharge planning process, led by the attending physician.  Recommendations may be updated based on patient status, additional functional criteria and insurance authorization. ? ?Follow up Recommendations Other (comment) (TBD)  ? ? ?  ?Assistance Recommended at Discharge Intermittent Supervision/Assistance  ?Functional Status Assessment Patient has had a recent decline in their functional status and demonstrates the ability to make  significant improvements in function in a reasonable and predictable amount of time.  ?Frequency and Duration min 2x/week  ?2 weeks ?  ?   ? ?Prognosis Prognosis for Safe Diet Advancement: Good ?Barriers to Reach Goals: Time post onset  ? ?  ? ?Swallow Study   ?General Date of Onset: 07/11/21 ?HPI: Pt is a 88 y.o. male who presented with shortness of breath. Pt was found at facility in respiratory distress with O2 sats down to 60% on Ra. Had [place] in January 2023 and has been in [facility]. Reports he has been more short of breath for the past few months. CXR 3/30 revealed "Mild bibasilar atelectasis or infiltrate. Small associated right pleural effusion". "Less likely infiltrate...no pneumonia symptoms at this time" per MD note 3/30. PMH: CAD with stenting on Plavix and Eliquis regimen, chronic systolic CHF LVEF 19-37%, CKD stage IIIa, HTN, HLD, BPH,  DM2. ?Type of Study: Bedside Swallow Evaluation ?Previous Swallow Assessment: none per EMR ?Diet Prior to this Study: Regular ?Temperature Spikes Noted: No ?Respiratory Status: Room air ?History of Recent Intubation: No ?Behavior/Cognition: Alert;Cooperative;Pleasant mood ?Oral Cavity Assessment: Within Functional Limits ?  Oral Care Completed by SLP: No ?Oral Cavity - Dentition: Adequate natural dentition;Missing dentition ?Vision: Functional for self-feeding ?Self-Feeding Abilities: Able to feed self ?Patient Positioning: Upright in bed;Postural control adequate for testing ?Baseline Vocal Quality: Hoarse ?Volitional Cough: Strong ?Volitional Swallow: Able to elicit  ?  ?Oral/Motor/Sensory Function Overall Oral Motor/Sensory Function: Within functional limits   ?Ice Chips Ice chips: Impaired ?Presentation: Spoon ?Pharyngeal Phase Impairments: Cough - Delayed   ?Thin Liquid Thin Liquid: Impaired ?Presentation: Straw;Self Fed;Cup ?Pharyngeal  Phase Impairments: Suspected delayed Swallow;Throat Clearing - Immediate;Throat Clearing - Delayed;Cough -  Delayed;Multiple swallows  ?  ?Nectar Thick Nectar Thick Liquid: Not tested   ?Honey Thick Honey Thick Liquid: Not tested   ?Puree Puree: Impaired ?Presentation: Spoon;Self Fed ?Pharyngeal Phase Impairments: C

## 2021-07-12 NOTE — Consult Note (Addendum)
?Cardiology Consultation:  ? ?Patient ID: Kevin Schwartz ?MRN: [id number]; DOB: [birth year] ? ?Admit date: 07/11/2021 ?Date of Consult: 07/12/2021 ? ?PCP:  Kevin Honour, MD ?  ?[facility] Providers ?Cardiologist:  Kevin Bickers, MD 1}   ? ? ?Patient Profile:  ? ?Kevin Schwartz is a 88 y.o. male with a hx of CAD, chronic systolic heart failure, ischemic cardiomyopathy, Mobitz 1 second-degree heart block, DM 2, hypertension, hyperlipidemia, atrial flutter, RAS s/p stenting of left renal artery in 2008, MR, CKD 3, PAD, OSA on CPAP, and secondary hyperparathyroidism due to CKD who is being seen 07/12/2021 for the evaluation of heart failure exacerbation at the request of Dr. Broadus Schwartz. ? ?History of Present Illness:  ? ?Kevin Schwartz has a complex cardiac history.  He has CAD with multiple prior PCI's.  PTCA-OM in 1998, BMS to OM 2000, DES to [place] 2007.  Most recently, he was admitted with NSTEMI 07/2020 and underwent left heart catheterization that revealed multivessel CAD including 85% proximal RCA, 85% mid RCA, 90% distal RCA, 80% mid LAD, 80% OM 2.  Staged PCI on 08/02/2020 with DES to mid LAD, POBA of left circumflex lesion, and diffusely diseased RCA was treated medically.  Echocardiogram with EF 45 to 50%, mild MR, mild AI, small pericardial effusion.  Beta-blocker was stopped due to intermittent second-degree AV block.  In clinic follow-up on 08/24/2020 he denied dizziness or feelings of passing out.  Kevin Schwartz placed a heart monitor to evaluate heart block. Heart monitor placed and showed 39% atrial flutter burden.  He was referred to EP and seen by Kevin Schwartz 11/2020.  Since the patient was asymptomatic, conservative watchful waiting approach was planned.  He is anticoagulated with Eliquis.  Aspirin was discontinued and he was maintained on Plavix therapy along with Eliquis.  He has historically had issues with epistaxis which has been worse in the setting of Plavix and Eliquis, but  manageable. ? ?He was hospitalized January 2023 with COVID-19 pneumonia and sepsis.  He was treated for acute respiratory failure, received 10 days of Decadron and 5 days of remdesivir.  He also completed 5 days of ceftriaxone.  Hospitalization complicated by acute on chronic kidney disease, hyponatremia, hyperkalemia, acute on chronic systolic heart failure with moderate to severe MR.  Echocardiogram at that time showed an LVEF of 45 to 50%, moderate to severe MR moderate TR and no AS. He was discharged on 04/28/2021 to SNF. He was not on coreg or ASA per discharge summary. ? ?He presented to [facility] ER with shortness of breath after being found at SNF with respiratory distress and oxygen saturations in the 60% range. ? ?Hb 10.4 ?WBC 6.3 ?sCr 2.00 (1.59) - baseline ?1.6 ?K 5.1 --> 4.1 ?HST 54 --> 52 ?BNP 1590 ?Albumin 2.7 ?TSH 36.181 ? ?CXR with small right pleural effusion and bibasilar atelectasis. ? ?PTA medications indicate triple therapy with ASA, plavix, and eliquis, in addition to low dose coreg.  ? ?Cardiology was consulted for heart failure exacerbation.  ? ?He is now on ASA, coreg, eliquis, and farxiga.  ? ?Diuresing on IV lasix 40 mg BID --> 60 mg BID ? ?During my interview, he states he has been short of breath for 2 months, but also states his dyspnea predated his COVID-19 hospitalization. He denies chest pain. Currently on room air satting 95%. He reports lower extremity swelling for greater than 1 year.  ? ? ?Past Medical History:  ?Diagnosis Date  ? Anemia due to chronic kidney disease   ?  Bilateral inguinal hernia   ? CAD (coronary artery disease) cardiologist-  dr bensimhon  ? PTCA of OM in 1998, BMS OM in 2000, Cath 9/07 LM ok LAD ok. LCX 95% in OM prior to previous stent RCA. nondominant normal EF normal. [place] DES to OM 2007 (PLACED PROXIAMAL TO PREVIOUS STENT)  ? Chronic kidney disease, stage III (moderate) (HCC)   ? nephrologist-  dr detarding  ? Depression   ? Diabetes mellitus type 2,  diet-controlled ([place])   ? followed by pcp,  last A1c 5.2 on 09-10-2017 in epic  ? Gout, unspecified   ? 10-29-2017  per pt stable  ? HLD (hyperlipidemia)   ? HTN (hypertension)   ? MGUS (monoclonal gammopathy of unknown significance) previously followed by dr Beryle Beams , [facility] and released 08/01/2011  ? IgG kappa dx 2002 8% plasma cells in bone marrow; no lesions on bone X-rays;  ? Nocturia   ? OA (osteoarthritis)   ? OSA on CPAP   ? per study 01-30-2004  severe osa , AHI 51.6/hr  ? PAD (peripheral artery disease) ([place])   ? 05-21-2006  left renal artery stenosis, s/p balloon angioplasty and stenting;  last duplex 02/ 2012  normal , arteries patent  ? S/P coronary artery stent placement   ? 2000--  BMS x1  to OM;   2007-- DES x1  to OM proximal to previous stent  ? Secondary hyperparathyroidism of renal origin [facility])   ? Urgency of urination   ? Wears glasses   ? ? ?Past Surgical History:  ?Procedure Laterality Date  ? CARDIOVASCULAR STRESS TEST  2010  ? per dr bensimhon epic note dated 04-18-2016  normal  ? [place]  ? PTCA to OM  ? CORONARY ANGIOPLASTY WITH STENT PLACEMENT  2000  ? PCI and BMS x1 OM  ? CORONARY ANGIOPLASTY WITH STENT PLACEMENT  12-18-2005  dr Claiborne Billings  ? DES x1 to proximal to previously placed stent in OM  ? CORONARY BALLOON ANGIOPLASTY N/A 08/02/2020  ? Procedure: CORONARY BALLOON ANGIOPLASTY;  Surgeon: Troy Sine, MD;  Location: [facility];  Service: Cardiovascular;  Laterality: N/A;  ? CORONARY STENT INTERVENTION N/A 08/02/2020  ? Procedure: CORONARY STENT INTERVENTION;  Surgeon: Troy Sine, MD;  Location: [facility];  Service: Cardiovascular;  Laterality: N/A;  ? HERNIA REPAIR    ? INGUINAL HERNIA REPAIR Bilateral 10/30/2017  ? Procedure: LAPAROSCOPIC  BILATERAL INGUINAL HERNIA REPAIR  AND LEFT FEMERAL HERNIA REPAIR;  Surgeon: Michael Boston, MD;  Location: [facility];  Service: General;  Laterality: Bilateral;  ? INSERTION OF MESH Bilateral  10/30/2017  ? Procedure: INSERTION OF MESH;  Surgeon: Michael Boston, MD;  Location: [facility];  Service: General;  Laterality: Bilateral;  ? LEFT HEART CATH AND CORONARY ANGIOGRAPHY N/A 08/01/2020  ? Procedure: LEFT HEART CATH AND CORONARY ANGIOGRAPHY;  Surgeon: Troy Sine, MD;  Location: [facility];  Service: Cardiovascular;  Laterality: N/A;  ? RENAL ANGIOPLASTY Left 05-21-2006   dr berry  ? and stenting  ? TOTAL KNEE ARTHROPLASTY Left 07-22-2005   dr Shellia Carwin  [facility]  ? TRANSTHORACIC ECHOCARDIOGRAM  04/18/2016  ? ef 67-61%, grade 1 diastolic dysfunction/  mild to moderate AR/  mild MR  ?  ? ?Home Medications:  ?Prior to Admission medications   ?Medication Sig Start Date End Date Taking? Authorizing Provider  ?acetaminophen (TYLENOL) 500 MG tablet Take 1,000 mg by mouth every 8 (eight) hours  as needed for moderate pain.   Yes [provider]  ?allopurinol (ZYLOPRIM) 100 MG tablet Take 1 tablet (100 mg total) by mouth daily. 01/31/21  Yes Kevin Honour, MD  ?apixaban (ELIQUIS) 2.5 MG TABS tablet Take 1 tablet (2.5 mg total) by mouth 2 (two) times daily. 04/28/21  Yes Elgergawy, Silver Huguenin, MD  ?aspirin EC 81 MG tablet Take 81 mg by mouth daily. Swallow whole.   Yes [provider]  ?atorvastatin (LIPITOR) 80 MG tablet Take 1 tablet (80 mg total) by mouth daily. ?Patient taking differently: Take 80 mg by mouth at bedtime. 01/31/21 07/11/21 Yes Ngetich, Dinah C, NP  ?carvedilol (COREG) 3.125 MG tablet Take 3.125 mg by mouth 2 (two) times daily. Do not give if SBP <110 or HR <60 06/19/21  Yes [provider]  ?cetirizine (ZYRTEC) 10 MG tablet Take 10 mg by mouth at bedtime.   Yes [provider]  ?citalopram (CELEXA) 20 MG tablet Take 1 tablet (20 mg total) by mouth daily. 01/31/21  Yes Kevin Honour, MD  ?diphenhydrAMINE (BENADRYL) 25 MG tablet Take 25 mg by mouth 2 (two) times daily as needed for itching.   Yes [provider]  ?Emollient  (EUCERIN) lotion Apply 1 application. topically daily. Apply to rash on  chest, abdomen, back,neck and shoulders   Yes [provider]  ?ezetimibe (ZETIA) 10 MG tablet Take 10 mg by mouth daily.   Yes Provider, Historical

## 2021-07-12 NOTE — Progress Notes (Signed)
Initial Nutrition Assessment ? ?DOCUMENTATION CODES:  ? ?Not applicable ? ?INTERVENTION:  ? ?Multivitamin w/ minerals daily ?Ensure Enlive po BID, each supplement provides 350 kcal and 20 grams of protein. ?Liberalize pt diet to regular due to ongoing hypoglycemic events ?Encourage good PO intake ? ?NUTRITION DIAGNOSIS:  ? ?Increased nutrient needs related to chronic illness (CHF) as evidenced by estimated needs. ? ?GOAL:  ? ?Patient will meet greater than or equal to 90% of their needs ? ?MONITOR:  ? ?PO intake, Supplement acceptance, Labs, Skin ? ?REASON FOR ASSESSMENT:  ? ?Consult ?Assessment of nutrition requirement/status ? ?ASSESSMENT:  ? ?88 y.o. male presented to the ED with shortness of breath. PMH includes CHF, CKD IIIa, HTN, T2DM, MGUS, and NSTEMI. Pt admitted with CHF exacerbation. ? ?Pt reports that his appetite has been good and that he has been eating good at the facility he came from. Reports that his family provided recipes to help with his diabetes. Pt reports that he gets trays at his facility. Denies any difficulty chewing or swallowing or nausea and vomiting. Pt reports that he does drink supplements. Pt with shortness of breath PTA but stated that it was not impacting his PO intake. ?Pt reports that he had lunch here today and that it was good, reports that he ate all of it but his angel food cake and milk.  ? ?Pt did not provide UBW, but states that he has been gaining weight. Per EMR, pt has not had any weight loss within the past year. Pt reports that he primarily uses a wheelchair to get around at his facility.  ?  ?Discussed ONS with pt, pt agreeable to ONS here.  ? ?Medications reviewed and include: Farxiga, Lasix, SSI 0-9 units q4h + 0-9 units TID, IV Ferrlecit ?Labs reviewed: BUN 36, Creatinine 1.96, 24 hr CBG 64-138 ? ?NUTRITION - FOCUSED PHYSICAL EXAM: ? ?Flowsheet Row Most Recent Value  ?Orbital Region No depletion  ?Upper Arm Region No depletion  ?Thoracic and Lumbar Region No  depletion  ?Buccal Region No depletion  ?Temple Region Mild depletion  ?Clavicle Bone Region Moderate depletion  ?Clavicle and Acromion Bone Region Moderate depletion  ?Scapular Bone Region Moderate depletion  ?Dorsal Hand Mild depletion  ?Patellar Region Moderate depletion  ?Anterior Thigh Region Moderate depletion  ?Posterior Calf Region Moderate depletion  ?Edema (RD Assessment) Moderate  ?Hair Reviewed  ?Eyes Reviewed  ?Mouth Reviewed  ?Skin Reviewed  ?Nails Reviewed  ? ?Diet Order:   ?Diet Order   ? ?       ?  Diet Carb Modified Fluid consistency: Thin; Room service appropriate? Yes  Diet effective now       ?  ? ?  ?  ? ?  ? ? ?EDUCATION NEEDS:  ? ?No education needs have been identified at this time ? ?Skin:  Skin Assessment: Skin Integrity Issues: ?Skin Integrity Issues:: Unstageable ?Unstageable: L heel ? ?Last BM:  3/30 ? ?Height:  ?Ht Readings from Last 1 Encounters:  ?07/12/21 '5\' 8"'$  (1.727 m)  ? ?Weight:  ?Wt Readings from Last 1 Encounters:  ?07/12/21 82.8 kg  ? ?Ideal Body Weight:  70 kg ? ?BMI:  Body mass index is 27.76 kg/m?. ? ?Estimated Nutritional Needs:  ? ?Kcal:  1900-2100 ? ?Protein:  95-110 grams ? ?Fluid:  >/= 1.9 L ? ? ? ?Hermina Barters RD, LDN ?Clinical Dietitian ?See AMiON for contact information.  ? ?

## 2021-07-12 NOTE — Consult Note (Signed)
WOC Nurse Consult Note: ?Patient receiving care in [place] ?Reason for Consult: left foot wound ?Wound type: Unstageagble pressure injury on the the left heel ?Pressure Injury POA: Yes ?Measurement: 1.5 x 1.5 x 0.1 ?Wound bed: 95% yellow 5 % pink ?Drainage (amount, consistency, odor) serosanguinous on dressing.  ?Periwound: intact ?Dressing procedure/placement/frequency: ?Clean both feet with soap and water, rinse and pat dry. Apply a cut to fit piece of Aquacel Advantage Kellie Simmering # [id number]) over the left heel wound, cover with 4 x 4 and wrap the foot with kerlix. Change daily.  ?Place a prophylactic heel foam dressing over the right heel and place both feet in Prevalon boots. ? ?Monitor the wound area(s) for worsening of condition such as: ?Signs/symptoms of infection, increase in size, development of or worsening of odor, ?development of pain, or increased pain at the affected locations.   ?Notify the medical team if any of these develop. ? ?Thank you for the consult. [place] nurse will not follow at this time.   ?Please re-consult the [facility] team if needed. ? ?Cathlean Marseilles. Tamala Julian, MSN, RN, CMSRN, AGCNS, WTA ?Wound Treatment Associate ?Pager [phone]   ? ? ?  ?

## 2021-07-12 NOTE — TOC Initial Note (Signed)
Transition of Care (TOC) - Initial/Assessment Note  ? ? ?Patient Details  ?Name: Kevin Schwartz ?MRN: [id number] ?Date of Birth: 1931/02/01 ? ?Transition of Care (TOC) CM/SW Contact:    ?Kevin Cunas, LCSW ?Phone Number: ?07/12/2021, 2:51 PM ? ?Clinical Narrative:  Pt admitted from [facility] where he is a STR resident. Spoke to pt's son, Kevin Schwartz [phone] who reports they have been pleased with pt's care at [facility] and plan for him to return at dc. Confirmed with Kevin Schwartz at [facility] that pt is able to return and they will start [employer] (not [employer]) auth Monday. SW will assist as indicated.  ? ?Kevin Schwartz, MSW, LCSW ?[phone] (coverage) ? ?             ? ? ?Expected Discharge Plan: [place] ?Barriers to Discharge: Continued Medical Work up, Ship broker ? ? ?Patient Goals and CMS Choice ?  ?  ?  ? ?Expected Discharge Plan and Services ?Expected Discharge Plan: [place] ?  ?  ?Post Acute Care Choice: [place] ?Living arrangements for the past 2 months: [place] ?                ?  ?  ?  ?  ?  ?  ?  ?  ?  ?  ? ?Prior Living Arrangements/Services ?Living arrangements for the past 2 months: [place] ?Lives with:: Facility Resident ?  ?       ?Need for Family Participation in Patient Care: No (Comment) ?  ?  ?Criminal Activity/Legal Involvement Pertinent to Current Situation/Hospitalization: No - Comment as needed ? ?Activities of Daily Living ?  ?  ? ?Permission Sought/Granted ?Permission sought to share information with : Customer service manager ?  ?   ?   ?   ?   ? ?Emotional Assessment ?  ?  ?  ?Orientation: : Oriented to Self, Oriented to Place, Oriented to  Time ?  ?  ? ?Admission diagnosis:  CHF exacerbation ([place]) [I50.9] ?Acute on chronic congestive heart failure, unspecified heart failure type ([place]) [I50.9] ?Patient Active Problem List  ? Diagnosis Date Noted  ? Acute on chronic combined  systolic and diastolic CHF (congestive heart failure) ([place]) 07/11/2021  ? Acute respiratory failure with hypoxia ([place]) 07/11/2021  ? Leg wound, left 07/11/2021  ? Atrial flutter ([place]) 07/11/2021  ? Acute CHF (congestive heart failure) ([place]) 04/16/2021  ? PNA (pneumonia) 04/16/2021  ? COVID-19   ? Elevated troponin   ? NSTEMI (non-ST elevated myocardial infarction) ([place]) 07/31/2020  ? Grieving 02/16/2020  ? Bilateral inguinal hernia s/p lap hernia repair w mesh 10/30/2017 10/30/2017  ? Left femoral hernia s/p lap hernia repair w mesh 10/30/2017 10/30/2017  ? Loss of weight 01/21/2016  ? Heme positive stool 01/21/2016  ? Heart murmur 08/18/2013  ? Chronic kidney disease, stage III (moderate) ([place]) 07/14/2013  ? Hyperlipidemia LDL goal < 100 07/14/2013  ? Essential hypertension, benign 07/14/2013  ? Type II or unspecified type diabetes mellitus with renal manifestations, not stated as uncontrolled(250.40) 07/14/2013  ? Insomnia 07/14/2013  ? Angioedema of lips 12/26/2012  ? MGUS (monoclonal gammopathy of unknown significance) 08/01/2011  ? Gout of right wrist 08/01/2011  ? S/P coronary artery stent placement 08/01/2011  ? DM type 2 causing CKD stage 2 ([place]) 08/01/2011  ? Bradycardia 10/15/2010  ? Mixed hyperlipidemia due to type 2 diabetes mellitus ([place]) 05/01/2010  ? Essential hypertension 05/01/2010  ? CAD, NATIVE VESSEL 05/01/2010  ?  PVD 05/01/2010  ? ?PCP:  Kevin Honour, MD ?Pharmacy:   ?[id number] [address]?Phone: [phone]: [phone]? ?[facility] ?[place]?Phone: [phone]: [phone]? ? ? ? ?Social Determinants of Health (SDOH) Interventions ?  ? ?Readmission Risk Interventions ?   ? View : No data to display.  ?  ?  ?  ? ? ? ?

## 2021-07-12 NOTE — Progress Notes (Signed)
?PROGRESS NOTE ? ? ? ?Kevin Schwartz  HCW:[id number] DOB: Feb 02, 1931 DOA: 07/11/2021 ?PCP: [name], MD  ?Narrative 89/M with history of CAD, multiple PCI, stents, ischemic cardiomyopathy EF of 45%, type 2 diabetes mellitus, hypertension, dyslipidemia, CKD 3a, history of renal artery stenosis status post stenting in 2008, paroxysmal atrial flutter on Eliquis, history of recurrent epistaxis was brought to the ED 3/30 with respiratory distress.  Chest x-ray noted basilar atelectasis, small pleural effusion, labs noted creatinine of 2.0, BNP 1590, troponin of 50 ? ?Subjective: ?-Breathing is starting to improve ? ?Assessment & Plan: ? ?Acute on chronic systolic CHF ?Ischemic cardiomyopathy ?-Last echo in 4/22 with EF of 45% ?-Continue IV Lasix today, repeat echo ?-Continue carvedilol, resume Farxiga ?-Monitor I's/O, daily weights, GDMT limited by CKD  ? ?AKI on CKD 3a ?-Baseline creatinine around 1.6-2, creatinine now 2.0 ?-Monitor with diuresis ? ?History of CAD ?-had NSTEMI in 4/22, cath noted multivessel CAD- 85% proximal RCA, 85% mid RCA, 90% distal RCA, 80% mid LAD, 80% OM 2 lesion, on 08/02/2020 underwent staged PCI with DES to the mid LAD, balloon angioplasty of left circumflex lesion, the diffusely diseased RCA was treated medically. ?-Continue Plavix, Eliquis, statin ?-Troponins minimally elevated, no symptoms of ACS ?  ?Paroxysmal Atrial Flutter ?-Continue Eliquis, carvedilol ?  ?History of recurrent epistaxis ?-Ongoing frequent issue, complicated by Plavix, Eliquis use ?-Recently had anterior nasal packing this was removed 2 days ago ? ?Iron Defi anemia ?-will add Iv fe, monitor ? ?T2DM ?-CBG stable, start [place] ? ?H/o MGUS ?-fu with Heme ? ?L foot wound ?-Woc COnsult ? ?DVT prophylaxis:apixaban ?Code Status: Full Code ?Family Communication: Discussed with patient in detail, no family at bedside ?Disposition Plan: back to SNF when stable ? ?Consultants:  ? ? ?Procedures:  ? ?Antimicrobials:   ? ? ?Objective: ?Vitals:  ? 07/12/21 0600 07/12/21 0830 07/12/21 1039 07/12/21 1138  ?BP: 116/67  125/73 103/64  ?Pulse: 82 81 88 72  ?Resp: '13 19 16 15  '$ ?Temp:   97.7 ?F (36.5 ?C) 98 ?F (36.7 ?C)  ?TempSrc:   Oral Oral  ?SpO2: 97% 100% 97%   ?Weight:    82.8 kg  ?Height:    '5\' 8"'$  (1.727 m)  ? ?No intake or output data in the 24 hours ending 07/12/21 1204 ?Filed Weights  ? 07/11/21 1615 07/12/21 1138  ?Weight: 82.6 kg 82.8 kg  ? ? ?Examination: ? ?General exam: Pleasant elderly male laying in bed, AAOx3, no distress ?HEENT: Neck obese, positive JVD ?CVS: S1-S2, regular rhythm ?Lungs: Few basilar rales ?Abdomen: Soft, obese, mildly distended, bowel sounds present ?Extremities: 2+ edema left foot wound with dressing  ?Psychiatry: Mood & affect appropriate.  ? ?Data Reviewed:  ? ?CBC: ?Recent Labs  ?Lab 07/11/21 ?1827 07/11/21 ?2313 07/12/21 ?1761  ?WBC 6.3  --  4.7  ?NEUTROABS 3.6 3.7 3.0  ?HGB 10.4*  --  8.8*  ?HCT 32.8*  --  27.1*  ?MCV 95.1  --  93.4  ?PLT 317  --  297  ? ?Basic Metabolic Panel: ?Recent Labs  ?Lab 07/11/21 ?1827 07/11/21 ?2133 07/12/21 ?6073  ?NA 139  --  137  ?K 5.1  --  4.1  ?CL 108  --  108  ?CO2 22  --  22  ?GLUCOSE 89  --  87  ?BUN 35*  --  36*  ?CREATININE 2.00*  --  1.96*  ?CALCIUM 8.4*  --  8.0*  ?MG  --  1.9 1.7  ?PHOS  --  3.1 3.0  ? ?GFR: ?Estimated Creatinine Clearance: 27.3 mL/min (A) (by C-G formula based on SCr of 1.96 mg/dL (H)). ?Liver Function Tests: ?Recent Labs  ?Lab 07/11/21 ?1827 07/11/21 ?2133 07/12/21 ?3151  ?AST 39 34 30  ?ALT '18 18 15  '$ ?ALKPHOS 80 74 67  ?BILITOT 0.8 0.9 0.8  ?PROT 6.7 6.8 5.9*  ?ALBUMIN 2.8* 2.7* 2.4*  ? ?No results for input(s): LIPASE, AMYLASE in the last 168 hours. ?No results for input(s): AMMONIA in the last 168 hours. ?Coagulation Profile: ?No results for input(s): INR, PROTIME in the last 168 hours. ?Cardiac Enzymes: ?Recent Labs  ?Lab 07/11/21 ?2133  ?CKTOTAL 174  ? ?BNP (last 3 results) ?No results for input(s): PROBNP in the last 8760  hours. ?HbA1C: ?No results for input(s): HGBA1C in the last 72 hours. ?CBG: ?Recent Labs  ?Lab 07/11/21 ?2312 07/12/21 ?0420 07/12/21 ?7616 07/12/21 ?1143 07/12/21 ?1158  ?GLUCAP 138* 73 79 64* 87  ? ?Lipid Profile: ?No results for input(s): CHOL, HDL, LDLCALC, TRIG, CHOLHDL, LDLDIRECT in the last 72 hours. ?Thyroid Function Tests: ?Recent Labs  ?  07/12/21 ?0646  ?TSH 35.246*  ? ?Anemia Panel: ?Recent Labs  ?  07/11/21 ?2313  ?VITAMINB12 524  ?FOLATE 8.0  ?FERRITIN 72  ?TIBC 274  ?IRON 26*  ?RETICCTPCT 1.2  ? ?Urine analysis: ?   ?Component Value Date/Time  ? [place] YELLOW 04/17/2021 0117  ? APPEARANCEUR CLEAR 04/17/2021 0117  ? LABSPEC >1.030 (H) 04/17/2021 0117  ? PHURINE 5.5 04/17/2021 0117  ? GLUCOSEU NEGATIVE 04/17/2021 0117  ? HGBUR SMALL (A) 04/17/2021 0117  ? BILIRUBINUR MODERATE (A) 04/17/2021 0117  ? [place] NEGATIVE 04/17/2021 0117  ? PROTEINUR >300 (A) 04/17/2021 0117  ? NITRITE NEGATIVE 04/17/2021 0117  ? LEUKOCYTESUR NEGATIVE 04/17/2021 0117  ? ?Sepsis Labs: ?'@LABRCNTIP'$ (procalcitonin:4,lacticidven:4) ? ?) ?Recent Results (from the past 240 hour(s))  ?Resp Panel by RT-PCR (Flu A&B, Covid) Nasopharyngeal Swab     Status: None  ? Collection Time: 07/11/21  5:40 PM  ? Specimen: Nasopharyngeal Swab; Nasopharyngeal(NP) swabs in vial transport medium  ?Result Value Ref Range Status  ? SARS Coronavirus 2 by RT PCR NEGATIVE NEGATIVE Final  ?  Comment: (NOTE) ?SARS-CoV-2 target nucleic acids are NOT DETECTED. ? ?The SARS-CoV-2 RNA is generally detectable in upper respiratory ?specimens during the acute phase of infection. The lowest ?concentration of SARS-CoV-2 viral copies this assay can detect is ?138 copies/mL. A negative result does not preclude SARS-Cov-2 ?infection and should not be used as the sole basis for treatment or ?other patient management decisions. A negative result may occur with  ?improper specimen collection/handling, submission of specimen other ?than nasopharyngeal swab, presence of  viral mutation(s) within the ?areas targeted by this assay, and inadequate number of viral ?copies(<138 copies/mL). A negative result must be combined with ?clinical observations, patient history, and epidemiological ?information. The expected result is Negative. ? ?Fact Sheet for Patients:  ?EntrepreneurPulse.com.au ? ?Fact Sheet for Healthcare Providers:  ?IncredibleEmployment.be ? ?This test is no t yet approved or cleared by the [place] FDA and  ?has been authorized for detection and/or diagnosis of SARS-CoV-2 by ?FDA under an Emergency Use Authorization (EUA). This EUA will remain  ?in effect (meaning this test can be used) for the duration of the ?COVID-19 declaration under Section 564(b)(1) of the Act, 21 ?U.S.C.section 360bbb-3(b)(1), unless the authorization is terminated  ?or revoked sooner.  ? ? ?  ? Influenza A by PCR NEGATIVE NEGATIVE Final  ? Influenza B by PCR NEGATIVE NEGATIVE Final  ?  Comment: (NOTE) ?The Xpert Xpress SARS-CoV-2/FLU/RSV plus assay is intended as an aid ?in the diagnosis of influenza from Nasopharyngeal swab specimens and ?should not be used as a sole basis for treatment. Nasal washings and ?aspirates are unacceptable for Xpert Xpress SARS-CoV-2/FLU/RSV ?testing. ? ?Fact Sheet for Patients: ?EntrepreneurPulse.com.au ? ?Fact Sheet for Healthcare Providers: ?IncredibleEmployment.be ? ?This test is not yet approved or cleared by the [place] FDA and ?has been authorized for detection and/or diagnosis of SARS-CoV-2 by ?FDA under an Emergency Use Authorization (EUA). This EUA will remain ?in effect (meaning this test can be used) for the duration of the ?COVID-19 declaration under Section 564(b)(1) of the Act, 21 U.S.C. ?section 360bbb-3(b)(1), unless the authorization is terminated or ?revoked. ? ?Performed at [facility], [address], [place], [place] ?[address] ?  ?  ? ?Radiology Studies: ?DG  Chest Portable 1 View ? ?Result Date: 07/11/2021 ?CLINICAL DATA:  Dyspnea EXAM: PORTABLE CHEST 1 VIEW COMPARISON:  04/18/2021 FINDINGS: The lungs are symmetrically expanded. Mild bibasilar atelectasis or infiltrate

## 2021-07-12 NOTE — Progress Notes (Signed)
Hypoglycemic Event ? ?CBG: 64 ? ?Treatment: 8 oz juice/soda ? ?Symptoms: None ? ?Follow-up CBG: Time:1158 CBG Result:87 ? ?Possible Reasons for Event: Inadequate meal intake ? ?Comments/MD notified: yes ? ? ? ?Gertie Fey ? ? ?

## 2021-07-13 DIAGNOSIS — I5043 Acute on chronic combined systolic (congestive) and diastolic (congestive) heart failure: Secondary | ICD-10-CM | POA: Diagnosis not present

## 2021-07-13 LAB — CBC
HCT: 27.4 % — ABNORMAL LOW (ref 39.0–52.0)
Hemoglobin: 9.2 g/dL — ABNORMAL LOW (ref 13.0–17.0)
MCH: 30.6 pg (ref 26.0–34.0)
MCHC: 33.6 g/dL (ref 30.0–36.0)
MCV: 91 fL (ref 80.0–100.0)
Platelets: 294 10*3/uL (ref 150–400)
RBC: 3.01 MIL/uL — ABNORMAL LOW (ref 4.22–5.81)
RDW: 18.5 % — ABNORMAL HIGH (ref 11.5–15.5)
WBC: 4.8 10*3/uL (ref 4.0–10.5)
nRBC: 0 % (ref 0.0–0.2)

## 2021-07-13 LAB — COMPREHENSIVE METABOLIC PANEL
ALT: 14 U/L (ref 0–44)
AST: 27 U/L (ref 15–41)
Albumin: 2.6 g/dL — ABNORMAL LOW (ref 3.5–5.0)
Alkaline Phosphatase: 65 U/L (ref 38–126)
Anion gap: 8 (ref 5–15)
BUN: 35 mg/dL — ABNORMAL HIGH (ref 8–23)
CO2: 21 mmol/L — ABNORMAL LOW (ref 22–32)
Calcium: 8.3 mg/dL — ABNORMAL LOW (ref 8.9–10.3)
Chloride: 108 mmol/L (ref 98–111)
Creatinine, Ser: 2.01 mg/dL — ABNORMAL HIGH (ref 0.61–1.24)
GFR, Estimated: 31 mL/min — ABNORMAL LOW (ref >60)
Glucose, Bld: 81 mg/dL (ref 70–99)
Potassium: 4 mmol/L (ref 3.5–5.1)
Sodium: 137 mmol/L (ref 135–145)
Total Bilirubin: 0.6 mg/dL (ref 0.3–1.2)
Total Protein: 6.1 g/dL — ABNORMAL LOW (ref 6.5–8.1)

## 2021-07-13 LAB — GLUCOSE, CAPILLARY
Glucose-Capillary: 66 mg/dL — ABNORMAL LOW (ref 70–99)
Glucose-Capillary: 79 mg/dL (ref 70–99)
Glucose-Capillary: 91 mg/dL (ref 70–99)
Glucose-Capillary: 118 mg/dL — ABNORMAL HIGH (ref 70–99)
Glucose-Capillary: 105 mg/dL — ABNORMAL HIGH (ref 70–99)
Glucose-Capillary: 114 mg/dL — ABNORMAL HIGH (ref 70–99)

## 2021-07-13 LAB — T4, FREE: Free T4: 0.88 ng/dL (ref 0.61–1.12)

## 2021-07-13 MED ORDER — FUROSEMIDE 10 MG/ML IJ SOLN
80.0000 mg | Freq: Two times a day (BID) | INTRAMUSCULAR | Status: DC
Start: 2021-07-13 — End: 2021-07-17
  Administered 2021-07-13 – 2021-07-17 (×8): 80 mg via INTRAVENOUS
  Filled 2021-07-13 (×8): qty 8

## 2021-07-13 MED ORDER — LEVOTHYROXINE SODIUM 50 MCG PO TABS
50.0000 ug | ORAL_TABLET | Freq: Every day | ORAL | Status: DC
  Administered 2021-07-14 – 2021-07-19 (×6): 50 ug via ORAL
  Filled 2021-07-13 (×6): qty 1

## 2021-07-13 MED ORDER — IPRATROPIUM-ALBUTEROL 0.5-2.5 (3) MG/3ML IN SOLN
3.0000 mL | Freq: Three times a day (TID) | RESPIRATORY_TRACT | Status: DC
  Administered 2021-07-14: 3 mL via RESPIRATORY_TRACT
  Filled 2021-07-13 (×2): qty 3

## 2021-07-13 MED ORDER — ALBUMIN HUMAN 5 % IV SOLN
25.0000 g | Freq: Four times a day (QID) | INTRAVENOUS | Status: DC
  Filled 2021-07-13: qty 500

## 2021-07-13 MED ORDER — ALBUMIN HUMAN 25 % IV SOLN
12.5000 g | Freq: Four times a day (QID) | INTRAVENOUS | Status: AC
  Administered 2021-07-13 (×3): 12.5 g via INTRAVENOUS
  Filled 2021-07-13 (×2): qty 50

## 2021-07-13 NOTE — Progress Notes (Signed)
? ?Progress Note ? ?Patient Name: Kevin Schwartz ?Date of Encounter: 07/13/2021 ? ?[facility] Cardiologist: Glori Bickers, MD  ? ?Subjective  ? ?Breathing is better than admit  ? ?Inpatient Medications  ?  ?Scheduled Meds: ? allopurinol  100 mg Oral Daily  ? apixaban  2.5 mg Oral BID  ? aspirin EC  81 mg Oral Daily  ? citalopram  20 mg Oral Daily  ? dapagliflozin propanediol  10 mg Oral Daily  ? feeding supplement  237 mL Oral BID BM  ? furosemide  60 mg Intravenous BID  ? guaiFENesin  600 mg Oral BID  ? insulin aspart  0-9 Units Subcutaneous TID WC  ? ipratropium-albuterol  3 mL Nebulization QID  ? multivitamin with minerals  1 tablet Oral Daily  ? sodium chloride flush  3 mL Intravenous Q12H  ? tamsulosin  0.4 mg Oral Daily  ? ?Continuous Infusions: ? sodium chloride    ? ferric gluconate (FERRLECIT) IVPB 250 mg (07/12/21 1403)  ? ?PRN Meds: ?sodium chloride, acetaminophen **OR** acetaminophen, albuterol, HYDROcodone-acetaminophen, sodium chloride flush  ? ?Vital Signs  ?  ?Vitals:  ? 07/12/21 1945 07/12/21 2000 07/13/21 0020 07/13/21 0416  ?BP: (!) 120/57  111/60 127/65  ?Pulse: 89  86 86  ?Resp: '18  15 16  '$ ?Temp: 98.3 ?F (36.8 ?C)  97.8 ?F (36.6 ?C) 97.8 ?F (36.6 ?C)  ?TempSrc: Oral  Oral Oral  ?SpO2: 94% 97% 100% 99%  ?Weight:    82.7 kg  ?Height:      ? ? ?Intake/Output Summary (Last 24 hours) at 07/13/2021 0733 ?Last data filed at 07/13/2021 0420 ?Gross per 24 hour  ?Intake 645.35 ml  ?Output 2151 ml  ?Net -1505.65 ml  ? ? ?  07/13/2021  ?  4:16 AM 07/12/2021  ? 11:38 AM 07/11/2021  ?  4:15 PM  ?Last 3 Weights  ?Weight (lbs) 182 lb 5.1 oz 182 lb 8.7 oz 182 lb 1.6 oz  ?Weight (kg) 82.7 kg 82.8 kg 82.6 kg  ?   ? ?Telemetry  ?  ? SR   Type I 2nd degree AV block Personally Reviewed ? ?ECG  ?  ?No new  - Personally Reviewed ? ?Physical Exam  ? ?GEN: No acute distress.   ?Neck: JVP is increased  ?Cardiac: RRR,Gr III/vI systolic murmur  ?Respiratory: Clear to auscultation bilaterally. ?GI: Soft, nontender,  non-distended  ?MS: Tr edema; No deformity. ?Neuro:  Nonfocal  ?Psych: Normal affect  ? ?Labs  ?  ?High Sensitivity Troponin:   ?Recent Labs  ?Lab 07/11/21 ?1827 07/11/21 ?2133  ?TROPONINIHS 54* 52*  ?   ?Chemistry ?Recent Labs  ?Lab 07/11/21 ?1827 07/11/21 ?2133 07/12/21 ?8127 07/13/21 ?0356  ?NA 139  --  137 137  ?K 5.1  --  4.1 4.0  ?CL 108  --  108 108  ?CO2 22  --  22 21*  ?GLUCOSE 89  --  87 81  ?BUN 35*  --  36* 35*  ?CREATININE 2.00*  --  1.96* 2.01*  ?CALCIUM 8.4*  --  8.0* 8.3*  ?MG  --  1.9 1.7  --   ?PROT 6.7 6.8 5.9* 6.1*  ?ALBUMIN 2.8* 2.7* 2.4* 2.6*  ?AST 39 34 30 27  ?ALT '18 18 15 14  '$ ?ALKPHOS 80 74 67 65  ?BILITOT 0.8 0.9 0.8 0.6  ?GFRNONAA 32*  --  32* 31*  ?ANIONGAP 9  --  7 8  ?  ?Lipids No results for input(s): CHOL, TRIG, HDL, LABVLDL,  LDLCALC, CHOLHDL in the last 168 hours.  ?Hematology ?Recent Labs  ?Lab 07/11/21 ?1827 07/11/21 ?2313 07/12/21 ?8413 07/13/21 ?0356  ?WBC 6.3  --  4.7 4.8  ?RBC 3.45* 3.26* 2.90* 3.01*  ?HGB 10.4*  --  8.8* 9.2*  ?HCT 32.8*  --  27.1* 27.4*  ?MCV 95.1  --  93.4 91.0  ?MCH 30.1  --  30.3 30.6  ?MCHC 31.7  --  32.5 33.6  ?RDW 18.6*  --  18.6* 18.5*  ?PLT 317  --  297 294  ? ?Thyroid  ?Recent Labs  ?Lab 07/12/21 ?2440  ?TSH 35.246*  ?  ?BNP ?Recent Labs  ?Lab 07/11/21 ?1828  ?BNP 1,590.3*  ?  ?DDimer No results for input(s): DDIMER in the last 168 hours.  ? ?Radiology  ?  ?DG Chest Portable 1 View ? ?Result Date: 07/11/2021 ?CLINICAL DATA:  Dyspnea EXAM: PORTABLE CHEST 1 VIEW COMPARISON:  04/18/2021 FINDINGS: The lungs are symmetrically expanded. Mild bibasilar atelectasis or infiltrate is present, right greater than left, with small associated right pleural effusion present. No pneumothorax. Cardiomegaly is stable. Pulmonary vascularity is normal. No acute bone abnormality. IMPRESSION: Mild bibasilar atelectasis or infiltrate. Small associated right pleural effusion. Stable cardiomegaly. Electronically Signed   By: Fidela Salisbury M.D.   On: 07/11/2021 18:29   ? ?ECHOCARDIOGRAM COMPLETE ? ?Result Date: 07/12/2021 ?   ECHOCARDIOGRAM REPORT   Patient Name:   Kevin Schwartz [facility] Date of Exam: 07/12/2021 Medical Rec #:  [id number]               Height:       67.0 in Accession #:    [id number]              Weight:       182.1 lb Date of Birth:  11-06-1930                BSA:          1.943 m? Patient Age:    88 years                BP:           103/64 mmHg Patient Gender: M                       HR:           67 bpm. Exam Location:  Inpatient Procedure: 2D Echo, Cardiac Doppler and Color Doppler Indications:    CHF-Acute systolic  History:        Patient has prior history of Echocardiogram examinations, most                 recent 04/19/2021. CHF, CAD, Mitral Valve Disease,                 Arrythmias:Atrial Flutter; Risk Factors:Hypertension,                 Dyslipidemia, Diabetes and Current Smoker. CKD.  Sonographer:    Clayton Lefort RDCS (AE) Referring Phys: [place]  1. Left ventricular ejection fraction, by estimation, is 45 to 50%. The left ventricle has mildly decreased function. The left ventricle demonstrates global hypokinesis. There is mild left ventricular hypertrophy. Left ventricular diastolic function could not be evaluated.  2. Right ventricular systolic function is mildly reduced. The right ventricular size is normal. There is severely elevated pulmonary artery systolic pressure. The estimated right ventricular systolic pressure is 56.3 mmHg.  3. Left atrial  size was moderately dilated.  4. Right atrial size was mildly dilated.  5. The mitral valve is abnormal. Moderate to severe mitral valve regurgitation. No evidence of mitral stenosis.  6. Tricuspid valve regurgitation is severe.  7. The aortic valve is tricuspid. There is moderate calcification of the aortic valve. There is moderate thickening of the aortic valve. Aortic valve regurgitation is mild to moderate. Aortic valve sclerosis/calcification is present, without any evidence of  aortic stenosis.  8. The inferior vena cava is dilated in size with <50% respiratory variability, suggesting right atrial pressure of 15 mmHg. Comparison(s): Changes from prior study are noted. TR now severe, PA pressures higher than prior of 52 mmHg. FINDINGS  Left Ventricle: Left ventricular ejection fraction, by estimation, is 45 to 50%. The left ventricle has mildly decreased function. The left ventricle demonstrates global hypokinesis. The left ventricular internal cavity size was normal in size. There is  mild left ventricular hypertrophy. Left ventricular diastolic function could not be evaluated due to atrial fibrillation. Left ventricular diastolic function could not be evaluated. Right Ventricle: The right ventricular size is normal. Right vetricular wall thickness was not well visualized. Right ventricular systolic function is mildly reduced. There is severely elevated pulmonary artery systolic pressure. The tricuspid regurgitant velocity is 3.62 m/s, and with an assumed right atrial pressure of 15 mmHg, the estimated right ventricular systolic pressure is 19.5 mmHg. Left Atrium: Left atrial size was moderately dilated. Right Atrium: Right atrial size was mildly dilated. Prominent Eustachian valve. Pericardium: There is no evidence of pericardial effusion. Mitral Valve: The mitral valve is abnormal. There is moderate thickening of the mitral valve leaflet(s). There is moderate calcification of the mitral valve leaflet(s). Moderate to severe mitral valve regurgitation. No evidence of mitral valve stenosis. Tricuspid Valve: The tricuspid valve is normal in structure. Tricuspid valve regurgitation is severe. No evidence of tricuspid stenosis. Aortic Valve: The aortic valve is tricuspid. There is moderate calcification of the aortic valve. There is moderate thickening of the aortic valve. Aortic valve regurgitation is mild to moderate. Aortic regurgitation PHT measures 440 msec. Aortic valve  sclerosis/calcification is present, without any evidence of aortic stenosis. Aortic valve mean gradient measures 4.0 mmHg. Aortic valve peak gradient measures 7.3 mmHg. Aortic valve area, by VTI measures 1.72 cm?. Pulmonic Valv

## 2021-07-13 NOTE — Progress Notes (Signed)
Hypoglycemic Event ? ?CBG : 66 ? ?Treatment: 8 oz juice/soda ? ?Symptoms: None ? ?Follow-up CBG: Time: 0638 CBG Result: 79 ? ?Possible Reasons for Event: Inadequate meal intake ? ?Comments/MD notified : n/a ? ? ? ?Kevin Schwartz J ? ? ?

## 2021-07-13 NOTE — Progress Notes (Addendum)
?PROGRESS NOTE ? ? ? ?Kevin Schwartz  JIR:[id number] DOB: 1931/03/10 DOA: 07/11/2021 ?PCP: [name], MD  ?Narrative 89/M with history of CAD, multiple PCI, stents, ischemic cardiomyopathy EF of 45%, type 2 diabetes mellitus, hypertension, dyslipidemia, CKD 3a, history of renal artery stenosis status post stenting in 2008, paroxysmal atrial flutter on Eliquis, history of recurrent epistaxis was brought to the ED 3/30 with respiratory distress.  Chest x-ray noted basilar atelectasis, small pleural effusion, labs noted creatinine of 2.0, BNP 1590, troponin of 50 ?-Started on diuretics ? ?Subjective: ?-Feels better, breathing is improving, had long discussion about CODE STATUS, he wishes for full scope of Rx, full code ? ?Assessment & Plan: ? ?Acute on chronic systolic CHF ?Ischemic cardiomyopathy ?-Last echo in 4/22 with EF of 45% ?-Diuresed with IV Lasix he is 1.5 L negative, ?-Continue carvedilol, [place] resumed ?-Hypoalbuminemia also contributing to third spacing, will add couple of doses of IV albumin today ?-Creatinine stable ?-Monitor I's/O, daily weights, GDMT limited by CKD  ? ?AKI on CKD 3a ?-Baseline creatinine around 1.6-2, creatinine now 2.0 ?-Monitor with diuresis ? ?History of CAD ?-had NSTEMI in 4/22, cath noted multivessel CAD- 85% proximal RCA, 85% mid RCA, 90% distal RCA, 80% mid LAD, 80% OM 2 lesion, on 08/02/2020 underwent staged PCI with DES to the mid LAD, balloon angioplasty of left circumflex lesion, the diffusely diseased RCA was treated medically. ?-Continue Plavix, Eliquis, statin ?-Troponins minimally elevated, no symptoms of ACS ?  ?Paroxysmal Atrial Flutter ?-Continue Eliquis, carvedilol ? ?Abnormal TSH ?-Significantly elevated at 35, patient unable to provide any information about history of hypothyroidism or Synthroid use, free T4 is normal, follow-up T3 ?-Add low-dose 50 mcg Synthroid ?  ?History of recurrent epistaxis ?-Ongoing frequent issue, complicated by Plavix,  Eliquis use ?-Recently had anterior nasal packing this was removed 2 days ago ? ?Iron Defi anemia ?-Continue IV iron, monitor ? ?T2DM ?-CBG stable, start [place] ? ?H/o MGUS ?-fu with Heme ? ?L foot wound ?-Woc COnsult ? ?DVT prophylaxis:apixaban ?Code Status: Discussed CODE STATUS with the patient, wishes to remain full code ?Family Communication: Discussed with patient in detail, no family at bedside ?Disposition Plan: back to SNF when stable ? ?Consultants:  ? ? ?Procedures:  ? ?Antimicrobials:  ? ? ?Objective: ?Vitals:  ? 07/13/21 0739 07/13/21 0744 07/13/21 0750 07/13/21 1048  ?BP: (!) 95/53 (!) 113/54  111/79  ?Pulse: 79  81 92  ?Resp: '17  14 20  '$ ?Temp: 97.6 ?F (36.4 ?C)   97.9 ?F (36.6 ?C)  ?TempSrc: Oral   Oral  ?SpO2: 100%  100% 100%  ?Weight:      ?Height:      ? ? ?Intake/Output Summary (Last 24 hours) at 07/13/2021 1132 ?Last data filed at 07/13/2021 1115 ?Gross per 24 hour  ?Intake 1005.35 ml  ?Output 2701 ml  ?Net -1695.65 ml  ? ?Filed Weights  ? 07/11/21 1615 07/12/21 1138 07/13/21 0416  ?Weight: 82.6 kg 82.8 kg 82.7 kg  ? ? ?Examination: ? ?General exam: Pleasant elderly male sitting up in bed, AAOx3, no distress, hard of hearing ?HEENT: Positive JVD ?CVS: S1-S2, regular rate rhythm ?Lungs: Rare basilar rales otherwise clear ?Abdomen: Soft, obese, nontender, bowel sounds present ?Extremities: 1+ edema,  left foot wound with dressing  ?Psychiatry: Mood & affect appropriate.  ? ?Data Reviewed:  ? ?CBC: ?Recent Labs  ?Lab 07/11/21 ?1827 07/11/21 ?2313 07/12/21 ?7510 07/13/21 ?0356  ?WBC 6.3  --  4.7 4.8  ?NEUTROABS 3.6 3.7 3.0  --   ?  HGB 10.4*  --  8.8* 9.2*  ?HCT 32.8*  --  27.1* 27.4*  ?MCV 95.1  --  93.4 91.0  ?PLT 317  --  297 294  ? ?Basic Metabolic Panel: ?Recent Labs  ?Lab 07/11/21 ?1827 07/11/21 ?2133 07/12/21 ?8270 07/13/21 ?0356  ?NA 139  --  137 137  ?K 5.1  --  4.1 4.0  ?CL 108  --  108 108  ?CO2 22  --  22 21*  ?GLUCOSE 89  --  87 81  ?BUN 35*  --  36* 35*  ?CREATININE 2.00*  --  1.96* 2.01*   ?CALCIUM 8.4*  --  8.0* 8.3*  ?MG  --  1.9 1.7  --   ?PHOS  --  3.1 3.0  --   ? ?GFR: ?Estimated Creatinine Clearance: 26.6 mL/min (A) (by C-G formula based on SCr of 2.01 mg/dL (H)). ?Liver Function Tests: ?Recent Labs  ?Lab 07/11/21 ?1827 07/11/21 ?2133 07/12/21 ?7867 07/13/21 ?0356  ?AST 39 34 30 27  ?ALT '18 18 15 14  '$ ?ALKPHOS 80 74 67 65  ?BILITOT 0.8 0.9 0.8 0.6  ?PROT 6.7 6.8 5.9* 6.1*  ?ALBUMIN 2.8* 2.7* 2.4* 2.6*  ? ?No results for input(s): LIPASE, AMYLASE in the last 168 hours. ?No results for input(s): AMMONIA in the last 168 hours. ?Coagulation Profile: ?No results for input(s): INR, PROTIME in the last 168 hours. ?Cardiac Enzymes: ?Recent Labs  ?Lab 07/11/21 ?2133  ?CKTOTAL 174  ? ?BNP (last 3 results) ?No results for input(s): PROBNP in the last 8760 hours. ?HbA1C: ?No results for input(s): HGBA1C in the last 72 hours. ?CBG: ?Recent Labs  ?Lab 07/12/21 ?1158 07/12/21 ?2110 07/13/21 ?0612 07/13/21 ?5449 07/13/21 ?0833  ?GLUCAP 87 80 66* 79 91  ? ?Lipid Profile: ?No results for input(s): CHOL, HDL, LDLCALC, TRIG, CHOLHDL, LDLDIRECT in the last 72 hours. ?Thyroid Function Tests: ?Recent Labs  ?  07/11/21 ?2313 07/12/21 ?2010  ?TSH 36.181* 35.246*  ?FREET4 0.88  --   ? ?Anemia Panel: ?Recent Labs  ?  07/11/21 ?2313  ?VITAMINB12 524  ?FOLATE 8.0  ?FERRITIN 72  ?TIBC 274  ?IRON 26*  ?RETICCTPCT 1.2  ? ?Urine analysis: ?   ?Component Value Date/Time  ? [place] YELLOW 04/17/2021 0117  ? APPEARANCEUR CLEAR 04/17/2021 0117  ? LABSPEC >1.030 (H) 04/17/2021 0117  ? PHURINE 5.5 04/17/2021 0117  ? GLUCOSEU NEGATIVE 04/17/2021 0117  ? HGBUR SMALL (A) 04/17/2021 0117  ? BILIRUBINUR MODERATE (A) 04/17/2021 0117  ? [place] NEGATIVE 04/17/2021 0117  ? PROTEINUR >300 (A) 04/17/2021 0117  ? NITRITE NEGATIVE 04/17/2021 0117  ? LEUKOCYTESUR NEGATIVE 04/17/2021 0117  ? ?Sepsis Labs: ?'@LABRCNTIP'$ (procalcitonin:4,lacticidven:4) ? ?) ?Recent Results (from the past 240 hour(s))  ?Resp Panel by RT-PCR (Flu A&B, Covid)  Nasopharyngeal Swab     Status: None  ? Collection Time: 07/11/21  5:40 PM  ? Specimen: Nasopharyngeal Swab; Nasopharyngeal(NP) swabs in vial transport medium  ?Result Value Ref Range Status  ? SARS Coronavirus 2 by RT PCR NEGATIVE NEGATIVE Final  ?  Comment: (NOTE) ?SARS-CoV-2 target nucleic acids are NOT DETECTED. ? ?The SARS-CoV-2 RNA is generally detectable in upper respiratory ?specimens during the acute phase of infection. The lowest ?concentration of SARS-CoV-2 viral copies this assay can detect is ?138 copies/mL. A negative result does not preclude SARS-Cov-2 ?infection and should not be used as the sole basis for treatment or ?other patient management decisions. A negative result may occur with  ?improper specimen collection/handling, submission of specimen other ?than nasopharyngeal swab,  presence of viral mutation(s) within the ?areas targeted by this assay, and inadequate number of viral ?copies(<138 copies/mL). A negative result must be combined with ?clinical observations, patient history, and epidemiological ?information. The expected result is Negative. ? ?Fact Sheet for Patients:  ?EntrepreneurPulse.com.au ? ?Fact Sheet for Healthcare Providers:  ?IncredibleEmployment.be ? ?This test is no t yet approved or cleared by the [place] FDA and  ?has been authorized for detection and/or diagnosis of SARS-CoV-2 by ?FDA under an Emergency Use Authorization (EUA). This EUA will remain  ?in effect (meaning this test can be used) for the duration of the ?COVID-19 declaration under Section 564(b)(1) of the Act, 21 ?U.S.C.section 360bbb-3(b)(1), unless the authorization is terminated  ?or revoked sooner.  ? ? ?  ? Influenza A by PCR NEGATIVE NEGATIVE Final  ? Influenza B by PCR NEGATIVE NEGATIVE Final  ?  Comment: (NOTE) ?The Xpert Xpress SARS-CoV-2/FLU/RSV plus assay is intended as an aid ?in the diagnosis of influenza from Nasopharyngeal swab specimens and ?should not be  used as a sole basis for treatment. Nasal washings and ?aspirates are unacceptable for Xpert Xpress SARS-CoV-2/FLU/RSV ?testing. ? ?Fact Sheet for Patients: ?EntrepreneurPulse.com.au ? ?Fact Shee

## 2021-07-13 NOTE — Evaluation (Signed)
Occupational Therapy Evaluation ?Patient Details ?Name: Kevin Schwartz ?MRN: [id number] ?DOB: [birth year] ?Today's Date: 07/13/2021 ? ? ?History of Present Illness Pt is an 88 yo M presenting to [facility] ED on 07/11/21 from SNF rehab due to respiratory distress with SpO2 at 69% on RA. Imaging (+) for mild bibasilar atelectasis or infiltrate. Small associated right pleural effusion. Presents with L foot wound. PMH includes CHF, CAD s/p stenting on Eliquis and Plavix, CKD, HTN, DM2, and BPH.  ? ?Clinical Impression ?  ?Patient admitted for the diagnosis above.  He was at a local SNF, and plans on discharging back to complete post acute rehab prior to returning home.  OT will follow in the acute setting.   He is self limiting mobility currently due to L heel wound.   ?   ? ?Recommendations for follow up therapy are one component of a multi-disciplinary discharge planning process, led by the attending physician.  Recommendations may be updated based on patient status, additional functional criteria and insurance authorization.  ? ?Follow Up Recommendations ? Skilled nursing-short term rehab (<3 hours/day)  ?  ?Assistance Recommended at Discharge Frequent or constant Supervision/Assistance  ?Patient can return home with the following Two people to help with walking and/or transfers;A lot of help with bathing/dressing/bathroom;Direct supervision/assist for medications management;Assist for transportation ? ?  ?Functional Status Assessment ? Patient has had a recent decline in their functional status and demonstrates the ability to make significant improvements in function in a reasonable and predictable amount of time.  ?Equipment Recommendations ? None recommended by OT  ?  ?Recommendations for Other Services   ? ? ?  ?Precautions / Restrictions Precautions ?Precautions: Fall ?Precaution Comments: watch SpO2 ?Restrictions ?Weight Bearing Restrictions: No  ? ?  ? ?Mobility Bed Mobility ?Overal bed mobility: Needs  Assistance ?Bed Mobility: Supine to Sit, Sit to Supine ?  ?  ?Supine to sit: Max assist ?Sit to supine: Max assist ?  ?  ?  ? ?Transfers ?  ?  ?  ?  ?  ?  ?  ?  ?  ?General transfer comment: deferred any attempts that puts weight on his L foot ?  ? ?  ?Balance Overall balance assessment: Needs assistance ?Sitting-balance support: Bilateral upper extremity supported, Feet unsupported ?Sitting balance-[name]: Fair ?  ?Postural control: Posterior lean ?  ?  ?  ?  ?  ?  ?  ?  ?  ?  ?  ?  ?  ?  ?   ? ?ADL either performed or assessed with clinical judgement  ? ?ADL   ?  ?  ?Grooming: Wash/dry hands;Wash/dry face;Min guard;Sitting ?  ?Upper Body Bathing: Minimal assistance;Sitting ?  ?  ?  ?Upper Body Dressing : Minimal assistance;Sitting ?  ?Lower Body Dressing: Maximal assistance;Bed level ?  ?  ?  ?Toileting- Clothing Manipulation and Hygiene: Maximal assistance;Bed level ?  ?  ?  ?  ?   ? ? ? ?Vision Patient Visual Report: No change from baseline ?   ?   ?Perception Perception ?Perception: Not tested ?  ?Praxis Praxis ?Praxis: Not tested ?  ? ?Pertinent Vitals/Pain Pain Assessment ?Pain Assessment: Faces ?Faces Pain Scale: Hurts even more ?Pain Location: L heel ?Pain Descriptors / Indicators: Sharp, Discomfort ?Pain Intervention(s): Monitored during session  ? ? ? ?Hand Dominance Right ?  ?Extremity/Trunk Assessment Upper Extremity Assessment ?Upper Extremity Assessment: Generalized weakness ?  ?Lower Extremity Assessment ?Lower Extremity Assessment: Defer to PT evaluation ?  ?Cervical /  Trunk Assessment ?Cervical / Trunk Assessment: Kyphotic ?  ?Communication Communication ?Communication: HOH ?  ?Cognition Arousal/Alertness: Awake/alert ?Behavior During Therapy: [facility] for tasks assessed/performed ?Overall Cognitive Status: Within Functional Limits for tasks assessed ?  ?  ?  ?  ?  ?  ?  ?  ?  ?  ?  ?  ?  ?  ?  ?  ?  ?  ?  ?General Comments    ? ?  ?Exercises   ?  ?Shoulder Instructions    ? ? ?Home Living  Family/patient expects to be discharged to:: Skilled nursing facility ?  ?  ?  ?  ?  ?  ?  ?  ?  ?  ?  ?  ?  ?  ?  ?  ?Additional Comments: pt expected to return to SNF for rehab ?  ? ?  ?Prior Functioning/Environment Prior Level of Function : Needs assist ?  ?  ?  ?  ?  ?  ?Mobility Comments: uses wheelchair mostly, however, has been working on ambulating short distances with RW and wheelchair follow at SNF ?ADLs Comments: pt requires help dressing and bathing at SNF ?  ? ?  ?  ?OT Problem List: Decreased strength;Decreased activity tolerance;Impaired balance (sitting and/or standing);Pain ?  ?   ?OT Treatment/Interventions: Self-care/ADL training;Therapeutic activities;Patient/family education;Balance training  ?  ?OT Goals(Current goals can be found in the care plan section) Acute Rehab OT Goals ?Patient Stated Goal: Return to rehab ?OT Goal Formulation: With patient ?Time For Goal Achievement: 07/27/21 ?Potential to Achieve Goals: Good ?ADL Goals ?Pt Will Perform Grooming: with set-up;sitting ?Pt Will Perform Upper Body Bathing: with set-up;sitting ?Pt Will Perform Upper Body Dressing: with set-up;sitting ?Pt Will Perform Lower Body Dressing: with min assist;sit to/from stand ?Pt Will Transfer to Toilet: with min guard assist;ambulating;regular height toilet  ?OT Frequency: Min 2X/week ?  ? ?Co-evaluation   ?  ?  ?  ?  ? ?  ?AM-PAC OT "6 Clicks" Daily Activity     ?Outcome Measure Help from another person eating meals?: None ?Help from another person taking care of personal grooming?: None ?Help from another person toileting, which includes using toliet, bedpan, or urinal?: A Lot ?Help from another person bathing (including washing, rinsing, drying)?: A Lot ?Help from another person to put on and taking off regular upper body clothing?: A Little ?Help from another person to put on and taking off regular lower body clothing?: A Lot ?6 Click Score: 17 ?  ?End of Session Nurse Communication: Mobility  status ? ?Activity Tolerance: Patient limited by pain ?Patient left: in bed;with call bell/phone within reach;with bed alarm set ? ?OT Visit Diagnosis: Unsteadiness on feet (R26.81);Muscle weakness (generalized) (M62.81);Pain ?Pain - Right/Left: Left ?Pain - part of body: Ankle and joints of foot  ?              ?Time: 0017-4944 ?OT Time Calculation (min): 19 min ?Charges:  OT General Charges ?$OT Visit: 1 Visit ?OT Evaluation ?$OT Eval Moderate Complexity: 1 Mod ? ?07/13/2021 ? ?RP, OTR/L ? ?Acute Rehabilitation Services ? ?Office:  [phone] ? ? ?Shama Monfils D Mariangela Heldt ?07/13/2021, 1:30 PM ?

## 2021-07-14 DIAGNOSIS — I5043 Acute on chronic combined systolic (congestive) and diastolic (congestive) heart failure: Secondary | ICD-10-CM | POA: Diagnosis not present

## 2021-07-14 LAB — BASIC METABOLIC PANEL
Anion gap: 8 (ref 5–15)
BUN: 34 mg/dL — ABNORMAL HIGH (ref 8–23)
CO2: 25 mmol/L (ref 22–32)
Calcium: 8.5 mg/dL — ABNORMAL LOW (ref 8.9–10.3)
Chloride: 105 mmol/L (ref 98–111)
Creatinine, Ser: 1.95 mg/dL — ABNORMAL HIGH (ref 0.61–1.24)
GFR, Estimated: 32 mL/min — ABNORMAL LOW (ref >60)
Glucose, Bld: 72 mg/dL (ref 70–99)
Potassium: 5.1 mmol/L (ref 3.5–5.1)
Sodium: 138 mmol/L (ref 135–145)

## 2021-07-14 LAB — GLUCOSE, CAPILLARY
Glucose-Capillary: 85 mg/dL (ref 70–99)
Glucose-Capillary: 99 mg/dL (ref 70–99)
Glucose-Capillary: 85 mg/dL (ref 70–99)

## 2021-07-14 LAB — CBC
HCT: 27.3 % — ABNORMAL LOW (ref 39.0–52.0)
Hemoglobin: 9.2 g/dL — ABNORMAL LOW (ref 13.0–17.0)
MCH: 30.7 pg (ref 26.0–34.0)
MCHC: 33.7 g/dL (ref 30.0–36.0)
MCV: 91 fL (ref 80.0–100.0)
Platelets: 296 10*3/uL (ref 150–400)
RBC: 3 MIL/uL — ABNORMAL LOW (ref 4.22–5.81)
RDW: 18.5 % — ABNORMAL HIGH (ref 11.5–15.5)
WBC: 5.3 10*3/uL (ref 4.0–10.5)
nRBC: 0 % (ref 0.0–0.2)

## 2021-07-14 LAB — T3: T3, Total: 72 ng/dL (ref 71–180)

## 2021-07-14 MED ORDER — IPRATROPIUM-ALBUTEROL 0.5-2.5 (3) MG/3ML IN SOLN
3.0000 mL | Freq: Two times a day (BID) | RESPIRATORY_TRACT | Status: DC
  Administered 2021-07-14 – 2021-07-17 (×6): 3 mL via RESPIRATORY_TRACT
  Filled 2021-07-14 (×6): qty 3

## 2021-07-14 NOTE — Progress Notes (Signed)
? ?Progress Note ? ?Patient Name: Kevin Schwartz ?Date of Encounter: 07/14/2021 ? ?[facility] Cardiologist: Glori Bickers, MD  ? ?Subjective  ? ?Patient says he is feeling much better than admit   Feels like he felt last Oct 2022   Breathing is OK  no CP  ?Inpatient Medications  ?  ?Scheduled Meds: ? allopurinol  100 mg Oral Daily  ? apixaban  2.5 mg Oral BID  ? aspirin EC  81 mg Oral Daily  ? citalopram  20 mg Oral Daily  ? dapagliflozin propanediol  10 mg Oral Daily  ? feeding supplement  237 mL Oral BID BM  ? furosemide  80 mg Intravenous BID  ? guaiFENesin  600 mg Oral BID  ? insulin aspart  0-9 Units Subcutaneous TID WC  ? ipratropium-albuterol  3 mL Nebulization TID  ? levothyroxine  50 mcg Oral Q0600  ? multivitamin with minerals  1 tablet Oral Daily  ? sodium chloride flush  3 mL Intravenous Q12H  ? tamsulosin  0.4 mg Oral Daily  ? ?Continuous Infusions: ? sodium chloride    ? ferric gluconate (FERRLECIT) IVPB 250 mg (07/13/21 1022)  ? ?PRN Meds: ?sodium chloride, acetaminophen **OR** acetaminophen, albuterol, HYDROcodone-acetaminophen, sodium chloride flush  ? ?Vital Signs  ?  ?Vitals:  ? 07/13/21 2333 07/14/21 0003 07/14/21 0410 07/14/21 0411  ?BP:  105/63 113/65   ?Pulse:  90 92   ?Resp:  20 14   ?Temp:  97.7 ?F (36.5 ?C) 99 ?F (37.2 ?C)   ?TempSrc:  Oral Oral   ?SpO2: 100% 100% 98%   ?Weight:    79.4 kg  ?Height:      ? ? ?Intake/Output Summary (Last 24 hours) at 07/14/2021 0716 ?Last data filed at 07/14/2021 0411 ?Gross per 24 hour  ?Intake 1249.29 ml  ?Output 3925 ml  ?Net -2675.71 ml  ? ? ?Net neg 3.8 L  ? ? ?  07/14/2021  ?  4:11 AM 07/13/2021  ?  4:16 AM 07/12/2021  ? 11:38 AM  ?Last 3 Weights  ?Weight (lbs) 175 lb 0.7 oz 182 lb 5.1 oz 182 lb 8.7 oz  ?Weight (kg) 79.4 kg 82.7 kg 82.8 kg  ?   ? ?Telemetry  ?  ? SR   Type I 2nd degree AV block Personally Reviewed ? ?ECG  ?  ?No new  - Personally Reviewed ? ?Physical Exam  ? ?GEN: No acute distress.   ?Neck: JVP is normal ?Cardiac: RRR,Gr III/vI  systolic murmur  ?Respiratory: Clear to auscultation bilaterally. ?GI: Soft, nontender, non-distended  ?MS: Tr edema; No deformity. ?Neuro:  Nonfocal  ?Psych: Normal affect  ? ?Labs  ?  ?High Sensitivity Troponin:   ?Recent Labs  ?Lab 07/11/21 ?1827 07/11/21 ?2133  ?TROPONINIHS 54* 52*  ?   ?Chemistry ?Recent Labs  ?Lab 07/11/21 ?2133 07/12/21 ?2440 07/13/21 ?0356 07/14/21 ?0410  ?NA  --  137 137 138  ?K  --  4.1 4.0 5.1  ?CL  --  108 108 105  ?CO2  --  22 21* 25  ?GLUCOSE  --  87 81 72  ?BUN  --  36* 35* 34*  ?CREATININE  --  1.96* 2.01* 1.95*  ?CALCIUM  --  8.0* 8.3* 8.5*  ?MG 1.9 1.7  --   --   ?PROT 6.8 5.9* 6.1*  --   ?ALBUMIN 2.7* 2.4* 2.6*  --   ?AST 34 30 27  --   ?ALT '18 15 14  '$ --   ?[facility]  74 67 65  --   ?BILITOT 0.9 0.8 0.6  --   ?GFRNONAA  --  32* 31* 32*  ?ANIONGAP  --  '7 8 8  '$ ?  ?Lipids No results for input(s): CHOL, TRIG, HDL, LABVLDL, LDLCALC, CHOLHDL in the last 168 hours.  ?Hematology ?Recent Labs  ?Lab 07/12/21 ?0646 07/13/21 ?0356 07/14/21 ?0410  ?WBC 4.7 4.8 5.3  ?RBC 2.90* 3.01* 3.00*  ?HGB 8.8* 9.2* 9.2*  ?HCT 27.1* 27.4* 27.3*  ?MCV 93.4 91.0 91.0  ?MCH 30.3 30.6 30.7  ?MCHC 32.5 33.6 33.7  ?RDW 18.6* 18.5* 18.5*  ?PLT 297 294 296  ? ?Thyroid  ?Recent Labs  ?Lab 07/11/21 ?2313 07/12/21 ?6962  ?TSH 36.181* 35.246*  ?FREET4 0.88  --   ?  ?BNP ?Recent Labs  ?Lab 07/11/21 ?1828  ?BNP 1,590.3*  ?  ?DDimer No results for input(s): DDIMER in the last 168 hours.  ? ?Radiology  ?  ?ECHOCARDIOGRAM COMPLETE ? ?Result Date: 07/12/2021 ?   ECHOCARDIOGRAM REPORT   Patient Name:   Kevin Schwartz [facility] Date of Exam: 07/12/2021 Medical Rec #:  [id number]               Height:       67.0 in Accession #:    [id number]              Weight:       182.1 lb Date of Birth:  July 20, 1930                BSA:          1.943 m? Patient Age:    5 years                BP:           103/64 mmHg Patient Gender: M                       HR:           67 bpm. Exam Location:  Inpatient Procedure: 2D Echo, Cardiac Doppler and  Color Doppler Indications:    CHF-Acute systolic  History:        Patient has prior history of Echocardiogram examinations, most                 recent 04/19/2021. CHF, CAD, Mitral Valve Disease,                 Arrythmias:Atrial Flutter; Risk Factors:Hypertension,                 Dyslipidemia, Diabetes and Current Smoker. CKD.  Sonographer:    Clayton Lefort RDCS (AE) Referring Phys: [place]  1. Left ventricular ejection fraction, by estimation, is 45 to 50%. The left ventricle has mildly decreased function. The left ventricle demonstrates global hypokinesis. There is mild left ventricular hypertrophy. Left ventricular diastolic function could not be evaluated.  2. Right ventricular systolic function is mildly reduced. The right ventricular size is normal. There is severely elevated pulmonary artery systolic pressure. The estimated right ventricular systolic pressure is 64.4 mmHg.  3. Left atrial size was moderately dilated.  4. Right atrial size was mildly dilated.  5. The mitral valve is abnormal. Moderate to severe mitral valve regurgitation. No evidence of mitral stenosis.  6. Tricuspid valve regurgitation is severe.  7. The aortic valve is tricuspid. There is moderate calcification of the aortic valve. There is moderate thickening of the aortic valve. Aortic valve regurgitation  is mild to moderate. Aortic valve sclerosis/calcification is present, without any evidence of aortic stenosis.  8. The inferior vena cava is dilated in size with <50% respiratory variability, suggesting right atrial pressure of 15 mmHg. Comparison(s): Changes from prior study are noted. TR now severe, PA pressures higher than prior of 52 mmHg. FINDINGS  Left Ventricle: Left ventricular ejection fraction, by estimation, is 45 to 50%. The left ventricle has mildly decreased function. The left ventricle demonstrates global hypokinesis. The left ventricular internal cavity size was normal in size. There is  mild left  ventricular hypertrophy. Left ventricular diastolic function could not be evaluated due to atrial fibrillation. Left ventricular diastolic function could not be evaluated. Right Ventricle: The right ventricular size is normal. Right vetricular wall thickness was not well visualized. Right ventricular systolic function is mildly reduced. There is severely elevated pulmonary artery systolic pressure. The tricuspid regurgitant velocity is 3.62 m/s, and with an assumed right atrial pressure of 15 mmHg, the estimated right ventricular systolic pressure is 56.2 mmHg. Left Atrium: Left atrial size was moderately dilated. Right Atrium: Right atrial size was mildly dilated. Prominent Eustachian valve. Pericardium: There is no evidence of pericardial effusion. Mitral Valve: The mitral valve is abnormal. There is moderate thickening of the mitral valve leaflet(s). There is moderate calcification of the mitral valve leaflet(s). Moderate to severe mitral valve regurgitation. No evidence of mitral valve stenosis. Tricuspid Valve: The tricuspid valve is normal in structure. Tricuspid valve regurgitation is severe. No evidence of tricuspid stenosis. Aortic Valve: The aortic valve is tricuspid. There is moderate calcification of the aortic valve. There is moderate thickening of the aortic valve. Aortic valve regurgitation is mild to moderate. Aortic regurgitation PHT measures 440 msec. Aortic valve sclerosis/calcification is present, without any evidence of aortic stenosis. Aortic valve mean gradient measures 4.0 mmHg. Aortic valve peak gradient measures 7.3 mmHg. Aortic valve area, by VTI measures 1.72 cm?. Pulmonic Valve: The pulmonic valve was not well visualized. Pulmonic valve regurgitation is not visualized. No evidence of pulmonic stenosis. Aorta: The aortic root, ascending aorta and aortic arch are all structurally normal, with no evidence of dilitation or obstruction. Venous: The inferior vena cava is dilated in size with  less than 50% respiratory variability, suggesting right atrial pressure of 15 mmHg. IAS/Shunts: The interatrial septum was not well visualized.  LEFT VENTRICLE PLAX 2D LVIDd:         5.10 cm LVIDs:

## 2021-07-14 NOTE — Progress Notes (Signed)
?PROGRESS NOTE ? ? ? ?Kevin Schwartz  DJM:[id number] DOB: 01-10-1931 DOA: 07/11/2021 ?PCP: [name], MD  ?Narrative 89/M with history of CAD, multiple PCI, stents, ischemic cardiomyopathy EF of 45%, type 2 diabetes mellitus, hypertension, dyslipidemia, CKD 3a, history of renal artery stenosis status post stenting in 2008, paroxysmal atrial flutter on Eliquis, history of recurrent epistaxis was brought to the ED 3/30 with respiratory distress.  Chest x-ray noted basilar atelectasis, small pleural effusion, labs noted creatinine of 2.0, BNP 1590, troponin of 50 ?-Started on diuretics ? ?Subjective: ?-Feels better overall, breathing continues to improve ? ?Assessment & Plan: ? ?Acute on chronic systolic CHF ?Ischemic cardiomyopathy ?-Last echo in 4/22 with EF of 45% ?-Diuresed with IV Lasix, he is 4.1 L negative, given IV albumin yesterday ?-Cardiology following, transition to oral diuretics soon ?-Continue carvedilol, Farxiga resumed ?-Creatinine stable ?-Monitor I's/O, daily weights, GDMT limited by CKD  ?-Ambulate, PT OT ? ?AKI on CKD 3a ?-Baseline creatinine around 1.6-2, creatinine now 2.0 ?-Monitor with diuresis ? ?History of CAD ?-had NSTEMI in 4/22, cath noted multivessel CAD- 85% proximal RCA, 85% mid RCA, 90% distal RCA, 80% mid LAD, 80% OM 2 lesion, on 08/02/2020 underwent staged PCI with DES to the mid LAD, balloon angioplasty of left circumflex lesion, the diffusely diseased RCA was treated medically. ?-Continue Plavix, Eliquis, statin ?-Troponins minimally elevated, no symptoms of ACS ?  ?Paroxysmal Atrial Flutter ?-Continue Eliquis, carvedilol ? ?Abnormal TSH ?-Significantly elevated at 35, patient unable to provide any information about history of hypothyroidism or Synthroid use, free T4 is normal, follow-up T3 ?-Started on low-dose 50 mcg Synthroid ?  ?History of recurrent epistaxis ?-Ongoing frequent issue, complicated by Plavix, Eliquis use ?-Recently had anterior nasal packing this was  removed 2 days ago ? ?Iron Defi anemia ?-Continue IV iron, monitor ? ?T2DM ?-CBG stable, started on Farxiga ? ?H/o MGUS ?-fu with Heme ? ?L foot wound ?-Woc COnsult ? ?DVT prophylaxis:apixaban ?Code Status: Discussed CODE STATUS with the patient, wishes to remain full code ?Family Communication: Discussed with patient in detail, no family at bedside ?Disposition Plan: Back to SNF in 1 to 2 days ? ?Consultants:  ? ? ?Procedures:  ? ?Antimicrobials:  ? ? ?Objective: ?Vitals:  ? 07/14/21 0003 07/14/21 0410 07/14/21 0411 07/14/21 2229  ?BP: 105/63 113/65    ?Pulse: 90 92    ?Resp: 20 14    ?Temp: 97.7 ?F (36.5 ?C) 99 ?F (37.2 ?C)    ?TempSrc: Oral Oral    ?SpO2: 100% 98%  100%  ?Weight:   79.4 kg   ?Height:      ? ? ?Intake/Output Summary (Last 24 hours) at 07/14/2021 1127 ?Last data filed at 07/14/2021 0816 ?Gross per 24 hour  ?Intake 1034.9 ml  ?Output 3375 ml  ?Net -2340.1 ml  ? ?Filed Weights  ? 07/12/21 1138 07/13/21 0416 07/14/21 0411  ?Weight: 82.8 kg 82.7 kg 79.4 kg  ? ? ?Examination: ? ?General exam: Pleasant elderly male sitting up in bed, AAOx3, no distress, hard of hearing ?HEENT: No JVD ?CVS: S1-S2, regular rhythm ?Lungs: Rare basilar rales otherwise clear ?Abdomen: Soft, nontender, bowel sounds present ?Extremities: Trace edema, left foot wound with dressing  ?Psychiatry: Mood & affect appropriate.  ? ?Data Reviewed:  ? ?CBC: ?Recent Labs  ?Lab 07/11/21 ?1827 07/11/21 ?2313 07/12/21 ?7989 07/13/21 ?2119 07/14/21 ?0410  ?WBC 6.3  --  4.7 4.8 5.3  ?NEUTROABS 3.6 3.7 3.0  --   --   ?HGB 10.4*  --  8.8*  9.2* 9.2*  ?HCT 32.8*  --  27.1* 27.4* 27.3*  ?MCV 95.1  --  93.4 91.0 91.0  ?PLT 317  --  297 294 296  ? ?Basic Metabolic Panel: ?Recent Labs  ?Lab 07/11/21 ?1827 07/11/21 ?2133 07/12/21 ?8588 07/13/21 ?0356 07/14/21 ?0410  ?NA 139  --  137 137 138  ?K 5.1  --  4.1 4.0 5.1  ?CL 108  --  108 108 105  ?CO2 22  --  22 21* 25  ?GLUCOSE 89  --  87 81 72  ?BUN 35*  --  36* 35* 34*  ?CREATININE 2.00*  --  1.96* 2.01*  1.95*  ?CALCIUM 8.4*  --  8.0* 8.3* 8.5*  ?MG  --  1.9 1.7  --   --   ?PHOS  --  3.1 3.0  --   --   ? ?GFR: ?Estimated Creatinine Clearance: 25.3 mL/min (A) (by C-G formula based on SCr of 1.95 mg/dL (H)). ?Liver Function Tests: ?Recent Labs  ?Lab 07/11/21 ?1827 07/11/21 ?2133 07/12/21 ?5027 07/13/21 ?0356  ?AST 39 34 30 27  ?ALT '18 18 15 14  '$ ?ALKPHOS 80 74 67 65  ?BILITOT 0.8 0.9 0.8 0.6  ?PROT 6.7 6.8 5.9* 6.1*  ?ALBUMIN 2.8* 2.7* 2.4* 2.6*  ? ?No results for input(s): LIPASE, AMYLASE in the last 168 hours. ?No results for input(s): AMMONIA in the last 168 hours. ?Coagulation Profile: ?No results for input(s): INR, PROTIME in the last 168 hours. ?Cardiac Enzymes: ?Recent Labs  ?Lab 07/11/21 ?2133  ?CKTOTAL 174  ? ?BNP (last 3 results) ?No results for input(s): PROBNP in the last 8760 hours. ?HbA1C: ?No results for input(s): HGBA1C in the last 72 hours. ?CBG: ?Recent Labs  ?Lab 07/13/21 ?7412 07/13/21 ?1141 07/13/21 ?1635 07/13/21 ?2105 07/14/21 ?8786  ?GLUCAP 91 118* 105* 114* 85  ? ?Lipid Profile: ?No results for input(s): CHOL, HDL, LDLCALC, TRIG, CHOLHDL, LDLDIRECT in the last 72 hours. ?Thyroid Function Tests: ?Recent Labs  ?  07/11/21 ?2313 07/12/21 ?7672  ?TSH 36.181* 35.246*  ?FREET4 0.88  --   ? ?Anemia Panel: ?Recent Labs  ?  07/11/21 ?2313  ?VITAMINB12 524  ?FOLATE 8.0  ?FERRITIN 72  ?TIBC 274  ?IRON 26*  ?RETICCTPCT 1.2  ? ?Urine analysis: ?   ?Component Value Date/Time  ? [place] YELLOW 04/17/2021 0117  ? APPEARANCEUR CLEAR 04/17/2021 0117  ? LABSPEC >1.030 (H) 04/17/2021 0117  ? PHURINE 5.5 04/17/2021 0117  ? GLUCOSEU NEGATIVE 04/17/2021 0117  ? HGBUR SMALL (A) 04/17/2021 0117  ? BILIRUBINUR MODERATE (A) 04/17/2021 0117  ? [place] NEGATIVE 04/17/2021 0117  ? PROTEINUR >300 (A) 04/17/2021 0117  ? NITRITE NEGATIVE 04/17/2021 0117  ? LEUKOCYTESUR NEGATIVE 04/17/2021 0117  ? ?Sepsis Labs: ?'@LABRCNTIP'$ (procalcitonin:4,lacticidven:4) ? ?) ?Recent Results (from the past 240 hour(s))  ?Resp Panel by  RT-PCR (Flu A&B, Covid) Nasopharyngeal Swab     Status: None  ? Collection Time: 07/11/21  5:40 PM  ? Specimen: Nasopharyngeal Swab; Nasopharyngeal(NP) swabs in vial transport medium  ?Result Value Ref Range Status  ? SARS Coronavirus 2 by RT PCR NEGATIVE NEGATIVE Final  ?  Comment: (NOTE) ?SARS-CoV-2 target nucleic acids are NOT DETECTED. ? ?The SARS-CoV-2 RNA is generally detectable in upper respiratory ?specimens during the acute phase of infection. The lowest ?concentration of SARS-CoV-2 viral copies this assay can detect is ?138 copies/mL. A negative result does not preclude SARS-Cov-2 ?infection and should not be used as the sole basis for treatment or ?other patient management decisions. A negative result  may occur with  ?improper specimen collection/handling, submission of specimen other ?than nasopharyngeal swab, presence of viral mutation(s) within the ?areas targeted by this assay, and inadequate number of viral ?copies(<138 copies/mL). A negative result must be combined with ?clinical observations, patient history, and epidemiological ?information. The expected result is Negative. ? ?Fact Sheet for Patients:  ?EntrepreneurPulse.com.au ? ?Fact Sheet for Healthcare Providers:  ?IncredibleEmployment.be ? ?This test is no t yet approved or cleared by the [place] FDA and  ?has been authorized for detection and/or diagnosis of SARS-CoV-2 by ?FDA under an Emergency Use Authorization (EUA). This EUA will remain  ?in effect (meaning this test can be used) for the duration of the ?COVID-19 declaration under Section 564(b)(1) of the Act, 21 ?U.S.C.section 360bbb-3(b)(1), unless the authorization is terminated  ?or revoked sooner.  ? ? ?  ? Influenza A by PCR NEGATIVE NEGATIVE Final  ? Influenza B by PCR NEGATIVE NEGATIVE Final  ?  Comment: (NOTE) ?The Xpert Xpress SARS-CoV-2/FLU/RSV plus assay is intended as an aid ?in the diagnosis of influenza from Nasopharyngeal swab  specimens and ?should not be used as a sole basis for treatment. Nasal washings and ?aspirates are unacceptable for Xpert Xpress SARS-CoV-2/FLU/RSV ?testing. ? ?Fact Sheet for Patients: ?OptionRunner.is

## 2021-07-15 DIAGNOSIS — I5043 Acute on chronic combined systolic (congestive) and diastolic (congestive) heart failure: Secondary | ICD-10-CM | POA: Diagnosis not present

## 2021-07-15 LAB — CBC
HCT: 27.8 % — ABNORMAL LOW (ref 39.0–52.0)
Hemoglobin: 9 g/dL — ABNORMAL LOW (ref 13.0–17.0)
MCH: 29.8 pg (ref 26.0–34.0)
MCHC: 32.4 g/dL (ref 30.0–36.0)
MCV: 92.1 fL (ref 80.0–100.0)
Platelets: 331 10*3/uL (ref 150–400)
RBC: 3.02 MIL/uL — ABNORMAL LOW (ref 4.22–5.81)
RDW: 17.9 % — ABNORMAL HIGH (ref 11.5–15.5)
WBC: 5.8 10*3/uL (ref 4.0–10.5)
nRBC: 0 % (ref 0.0–0.2)

## 2021-07-15 LAB — GLUCOSE, CAPILLARY
Glucose-Capillary: 116 mg/dL — ABNORMAL HIGH (ref 70–99)
Glucose-Capillary: 107 mg/dL — ABNORMAL HIGH (ref 70–99)
Glucose-Capillary: 104 mg/dL — ABNORMAL HIGH (ref 70–99)
Glucose-Capillary: 120 mg/dL — ABNORMAL HIGH (ref 70–99)
Glucose-Capillary: 114 mg/dL — ABNORMAL HIGH (ref 70–99)

## 2021-07-15 LAB — BASIC METABOLIC PANEL
Anion gap: 7 (ref 5–15)
BUN: 37 mg/dL — ABNORMAL HIGH (ref 8–23)
CO2: 27 mmol/L (ref 22–32)
Calcium: 8.8 mg/dL — ABNORMAL LOW (ref 8.9–10.3)
Chloride: 103 mmol/L (ref 98–111)
Creatinine, Ser: 2.02 mg/dL — ABNORMAL HIGH (ref 0.61–1.24)
GFR, Estimated: 31 mL/min — ABNORMAL LOW (ref >60)
Glucose, Bld: 73 mg/dL (ref 70–99)
Potassium: 4.6 mmol/L (ref 3.5–5.1)
Sodium: 137 mmol/L (ref 135–145)

## 2021-07-15 MED ORDER — EZETIMIBE 10 MG PO TABS
10.0000 mg | ORAL_TABLET | Freq: Every day | ORAL | Status: DC
  Administered 2021-07-15 – 2021-07-18 (×4): 10 mg via ORAL
  Filled 2021-07-15 (×4): qty 1

## 2021-07-15 NOTE — Plan of Care (Signed)
  Problem: Education: Goal: Ability to demonstrate management of disease process will improve Outcome: Progressing Goal: Ability to verbalize understanding of medication therapies will improve Outcome: Progressing   

## 2021-07-15 NOTE — Consult Note (Addendum)
? ?  [facility] CM Inpatient Consult ? ? ?07/15/2021 ? ?Kevin Schwartz ?Nov 18, 1930 ?[id number] ? ?[facility] [ACO] Patient: [employer] ? ?Primary Care Provider:  [name], MD, [facility]  ? ?Review reveals patient was admitted from ?[facility] and is being recommended for ongoing SNF at transition. ? ?07/19/21 Correction: Patient from [facility] and update ? ?Plan:   Patient needs are to be met at a skilled nursing facility level of care for transitional needs. ? ? ?For questions or referrals, please contact: ? ? ?Natividad Brood, RN BSN CCM ?[facility] Liaison ? [phone] business mobile phone ?Toll free office [phone]  ?Fax number: [phone] ?[email or web address] ? ? ? ? ? ? ? ? ?

## 2021-07-15 NOTE — Progress Notes (Signed)
Speech Language Pathology Treatment: Dysphagia  ?Patient Details ?Name: Kevin Schwartz ?MRN: [id number] ?DOB: April 21, 1930 ?Today's Date: 07/15/2021 ?Time: 7341-9379 ?SLP Time Calculation (min) (ACUTE ONLY): 8 min ? ?Assessment / Plan / Recommendation ?Clinical Impression ? Pt demonstrates no signs of aspiration today. He has been consistently improving and reports having a good appetite and tolerating regular textures on meal tray this am. No SLP f/u needed, pt to continue regular diet and thin liquids.   ?HPI HPI: Pt is a 88 y.o. male who presented with shortness of breath. Pt was found at facility in respiratory distress with O2 sats down to 60% on Ra. Had [place] in January 2023 and has been in [facility]. Reports he has been more short of breath for the past few months. CXR 3/30 revealed "Mild bibasilar atelectasis or infiltrate. Small associated right pleural effusion". "Less likely infiltrate...no pneumonia symptoms at this time" per MD note 3/30. PMH: CAD with stenting on Plavix and Eliquis regimen, chronic systolic CHF LVEF 02-40%, CKD stage IIIa, HTN, HLD, BPH,  DM2. ?  ?   ?SLP Plan ? All goals met;Discharge SLP treatment due to (comment) ? ?  ?  ?Recommendations for follow up therapy are one component of a multi-disciplinary discharge planning process, led by the attending physician.  Recommendations may be updated based on patient status, additional functional criteria and insurance authorization. ?  ? ?Recommendations  ?Diet recommendations: Regular;Thin liquid  ?   ?    ?   ? ? ? ? Plan: All goals met;Discharge SLP treatment due to (comment) ? ? ? ? ?  ?  ? ? ?Joleene Burnham, Katherene Ponto ? ?07/15/2021, 9:34 AM ?

## 2021-07-15 NOTE — Progress Notes (Addendum)
?PROGRESS NOTE ? ? ? ?Merwin Breden  KKX:[id number] DOB: September 17, 1930 DOA: 07/11/2021 ?PCP: [name], MD  ?Narrative 89/M with history of CAD, multiple PCI, stents, ischemic cardiomyopathy EF of 45%, type 2 diabetes mellitus, hypertension, dyslipidemia, CKD 3a, history of renal artery stenosis status post stenting in 2008, paroxysmal atrial flutter on Eliquis, history of recurrent epistaxis was brought to the ED 3/30 with respiratory distress.  Chest x-ray noted basilar atelectasis, small pleural effusion, labs noted creatinine of 2.0, BNP 1590, troponin of 50 ?-Started on diuretics ? ?Subjective: ?-Feels better overall, breathing continues to improve ? ?Assessment & Plan: ? ?Acute on chronic systolic CHF/ICM ?Mod to severe MR ?Severe Pulm Hypertension ?-Last echo in 4/22 with EF of 45% ?-Diuresed with IV Lasix, he is 8.5 L negative ?-Cardiology following, transition to oral diuretics soon ?-Continue carvedilol, Farxiga resumed ?-Creatinine stable, EP consulted on account of atrial arrhythmias ?-Monitor I's/O, daily weights, GDMT limited by CKD  ?-PT OT ? ?AKI on CKD 3a ?-Baseline creatinine around 1.6-2, creatinine now 2.0 ?-Stable, monitor with diuresis ? ?History of CAD ?-had NSTEMI in 4/22, cath noted multivessel CAD- 85% proximal RCA, 85% mid RCA, 90% distal RCA, 80% mid LAD, 80% OM 2 lesion, on 08/02/2020 underwent staged PCI with DES to the mid LAD, balloon angioplasty of left circumflex lesion, the diffusely diseased RCA was treated medically. ?-Continue Plavix, Eliquis, statin ?-Troponins minimally elevated, no symptoms of ACS ?  ?Paroxysmal Atrial Flutter ?-Continue Eliquis, carvedilol ? ?Abnormal TSH ?-Significantly elevated at 35, patient unable to provide any information about history of hypothyroidism or Synthroid use, free T4 is normal, follow-up T3 ?-Started on low-dose 50 mcg Synthroid ?  ?History of recurrent epistaxis ?-Ongoing frequent issue, complicated by Plavix, Eliquis  use ?-Recently had anterior nasal packing this was removed 2 days ago ? ?Iron Defi anemia ?-Continue IV iron, monitor ? ?T2DM ?-CBG stable, started on Farxiga ? ?H/o MGUS ?-fu with Heme ? ?L foot wound ?-Woc COnsult ? ?DVT prophylaxis:apixaban ?Code Status: Discussed CODE STATUS with the patient, wishes to remain full code ?Family Communication: Discussed with patient in detail, no family at bedside ?Disposition Plan: Back to SNF in 48 hours ? ?Consultants:  ? ? ?Procedures:  ? ?Antimicrobials:  ? ? ?Objective: ?Vitals:  ? 07/15/21 1124 07/15/21 1125 07/15/21 1126 07/15/21 1136  ?BP:    111/70  ?Pulse:    96  ?Resp: '18 17 17 18  '$ ?Temp:    98.4 ?F (36.9 ?C)  ?TempSrc:    Oral  ?SpO2:    100%  ?Weight:      ?Height:      ? ? ?Intake/Output Summary (Last 24 hours) at 07/15/2021 1156 ?Last data filed at 07/15/2021 1116 ?Gross per 24 hour  ?Intake 749 ml  ?Output 6500 ml  ?Net -5751 ml  ? ?Filed Weights  ? 07/13/21 0416 07/14/21 0411 07/15/21 0504  ?Weight: 82.7 kg 79.4 kg 78.3 kg  ? ? ?Examination: ? ?General exam: Pleasant elderly male sitting up in bed, AAOx3, no distress, hard of hearing ?HEENT: No JVD ?CVs: S1-S2, regular rate rhythm ?Lungs: Few basilar rales, otherwise clear  ?Abdomen: Soft, nontender, bowel sounds present ?Extremities: Trace edema, left foot wound with dressing  ?Psychiatry: Mood & affect appropriate.  ? ?Data Reviewed:  ? ?CBC: ?Recent Labs  ?Lab 07/11/21 ?1827 07/11/21 ?2313 07/12/21 ?1696 07/13/21 ?0356 07/14/21 ?0410 07/15/21 ?0234  ?WBC 6.3  --  4.7 4.8 5.3 5.8  ?NEUTROABS 3.6 3.7 3.0  --   --   --   ?  HGB 10.4*  --  8.8* 9.2* 9.2* 9.0*  ?HCT 32.8*  --  27.1* 27.4* 27.3* 27.8*  ?MCV 95.1  --  93.4 91.0 91.0 92.1  ?PLT 317  --  297 294 296 331  ? ?Basic Metabolic Panel: ?Recent Labs  ?Lab 07/11/21 ?1827 07/11/21 ?2133 07/12/21 ?5284 07/13/21 ?0356 07/14/21 ?0410 07/15/21 ?0234  ?NA 139  --  137 137 138 137  ?K 5.1  --  4.1 4.0 5.1 4.6  ?CL 108  --  108 108 105 103  ?CO2 22  --  22 21* 25 27   ?GLUCOSE 89  --  87 81 72 73  ?BUN 35*  --  36* 35* 34* 37*  ?CREATININE 2.00*  --  1.96* 2.01* 1.95* 2.02*  ?CALCIUM 8.4*  --  8.0* 8.3* 8.5* 8.8*  ?MG  --  1.9 1.7  --   --   --   ?PHOS  --  3.1 3.0  --   --   --   ? ?GFR: ?Estimated Creatinine Clearance: 24.5 mL/min (A) (by C-G formula based on SCr of 2.02 mg/dL (H)). ?Liver Function Tests: ?Recent Labs  ?Lab 07/11/21 ?1827 07/11/21 ?2133 07/12/21 ?1324 07/13/21 ?0356  ?AST 39 34 30 27  ?ALT '18 18 15 14  '$ ?ALKPHOS 80 74 67 65  ?BILITOT 0.8 0.9 0.8 0.6  ?PROT 6.7 6.8 5.9* 6.1*  ?ALBUMIN 2.8* 2.7* 2.4* 2.6*  ? ?No results for input(s): LIPASE, AMYLASE in the last 168 hours. ?No results for input(s): AMMONIA in the last 168 hours. ?Coagulation Profile: ?No results for input(s): INR, PROTIME in the last 168 hours. ?Cardiac Enzymes: ?Recent Labs  ?Lab 07/11/21 ?2133  ?CKTOTAL 174  ? ?BNP (last 3 results) ?No results for input(s): PROBNP in the last 8760 hours. ?HbA1C: ?No results for input(s): HGBA1C in the last 72 hours. ?CBG: ?Recent Labs  ?Lab 07/14/21 ?1159 07/14/21 ?1651 07/14/21 ?2113 07/15/21 ?0617 07/15/21 ?1132  ?GLUCAP 99 85 116* 107* 104*  ? ?Lipid Profile: ?No results for input(s): CHOL, HDL, LDLCALC, TRIG, CHOLHDL, LDLDIRECT in the last 72 hours. ?Thyroid Function Tests: ?No results for input(s): TSH, T4TOTAL, FREET4, T3FREE, THYROIDAB in the last 72 hours. ? ?Anemia Panel: ?No results for input(s): VITAMINB12, FOLATE, FERRITIN, TIBC, IRON, RETICCTPCT in the last 72 hours. ? ?Urine analysis: ?   ?Component Value Date/Time  ? [place] YELLOW 04/17/2021 0117  ? APPEARANCEUR CLEAR 04/17/2021 0117  ? LABSPEC >1.030 (H) 04/17/2021 0117  ? PHURINE 5.5 04/17/2021 0117  ? GLUCOSEU NEGATIVE 04/17/2021 0117  ? HGBUR SMALL (A) 04/17/2021 0117  ? BILIRUBINUR MODERATE (A) 04/17/2021 0117  ? [place] NEGATIVE 04/17/2021 0117  ? PROTEINUR >300 (A) 04/17/2021 0117  ? NITRITE NEGATIVE 04/17/2021 0117  ? LEUKOCYTESUR NEGATIVE 04/17/2021 0117  ? ?Sepsis  Labs: ?'@LABRCNTIP'$ (procalcitonin:4,lacticidven:4) ? ?) ?Recent Results (from the past 240 hour(s))  ?Resp Panel by RT-PCR (Flu A&B, Covid) Nasopharyngeal Swab     Status: None  ? Collection Time: 07/11/21  5:40 PM  ? Specimen: Nasopharyngeal Swab; Nasopharyngeal(NP) swabs in vial transport medium  ?Result Value Ref Range Status  ? SARS Coronavirus 2 by RT PCR NEGATIVE NEGATIVE Final  ?  Comment: (NOTE) ?SARS-CoV-2 target nucleic acids are NOT DETECTED. ? ?The SARS-CoV-2 RNA is generally detectable in upper respiratory ?specimens during the acute phase of infection. The lowest ?concentration of SARS-CoV-2 viral copies this assay can detect is ?138 copies/mL. A negative result does not preclude SARS-Cov-2 ?infection and should not be used as the sole basis for  treatment or ?other patient management decisions. A negative result may occur with  ?improper specimen collection/handling, submission of specimen other ?than nasopharyngeal swab, presence of viral mutation(s) within the ?areas targeted by this assay, and inadequate number of viral ?copies(<138 copies/mL). A negative result must be combined with ?clinical observations, patient history, and epidemiological ?information. The expected result is Negative. ? ?Fact Sheet for Patients:  ?EntrepreneurPulse.com.au ? ?Fact Sheet for Healthcare Providers:  ?IncredibleEmployment.be ? ?This test is no t yet approved or cleared by the [place] FDA and  ?has been authorized for detection and/or diagnosis of SARS-CoV-2 by ?FDA under an Emergency Use Authorization (EUA). This EUA will remain  ?in effect (meaning this test can be used) for the duration of the ?COVID-19 declaration under Section 564(b)(1) of the Act, 21 ?U.S.C.section 360bbb-3(b)(1), unless the authorization is terminated  ?or revoked sooner.  ? ? ?  ? Influenza A by PCR NEGATIVE NEGATIVE Final  ? Influenza B by PCR NEGATIVE NEGATIVE Final  ?  Comment: (NOTE) ?The Xpert  Xpress SARS-CoV-2/FLU/RSV plus assay is intended as an aid ?in the diagnosis of influenza from Nasopharyngeal swab specimens and ?should not be used as a sole basis for treatment. Nasal washings and ?aspirates are unacceptable for Xpert Xp

## 2021-07-15 NOTE — Care Management Important Message (Signed)
Important Message ? ?Patient Details  ?Name: Kevin Schwartz ?MRN: [id number] ?Date of Birth: 1930-11-13 ? ? ?Medicare Important Message Given:  Yes ? ? ? ? ?[name] ?07/15/2021, 8:51 AM ?

## 2021-07-15 NOTE — Progress Notes (Signed)
? ?Progress Note ? ?Patient Name: Kevin Schwartz ?Date of Encounter: 07/15/2021 ? ?Primary Cardiologist: Glori Bickers, MD  ? ?Subjective  ? ?No events overnight. ?- 4 L in interval. ? ?Patient notes that he still has so fluid on him but he feels much better. ? ?No CP, No palpitations. ?Breathing well presently (getting nebulizer treatment presently) ? ?Notes that as baseline he lives in an apartment but is fairly sedentary: reads magazines and watches TV. ? ?Inpatient Medications  ?  ?Scheduled Meds: ? allopurinol  100 mg Oral Daily  ? apixaban  2.5 mg Oral BID  ? aspirin EC  81 mg Oral Daily  ? citalopram  20 mg Oral Daily  ? dapagliflozin propanediol  10 mg Oral Daily  ? feeding supplement  237 mL Oral BID BM  ? furosemide  80 mg Intravenous BID  ? guaiFENesin  600 mg Oral BID  ? insulin aspart  0-9 Units Subcutaneous TID WC  ? ipratropium-albuterol  3 mL Nebulization BID  ? levothyroxine  50 mcg Oral Q0600  ? multivitamin with minerals  1 tablet Oral Daily  ? sodium chloride flush  3 mL Intravenous Q12H  ? tamsulosin  0.4 mg Oral Daily  ? ?Continuous Infusions: ? sodium chloride    ? ferric gluconate (FERRLECIT) IVPB 250 mg (07/14/21 0927)  ? ?PRN Meds: ?sodium chloride, acetaminophen **OR** acetaminophen, albuterol, HYDROcodone-acetaminophen, sodium chloride flush  ? ?Vital Signs  ?  ?Vitals:  ? 07/14/21 2024 07/14/21 2043 07/15/21 0504 07/15/21 0700  ?BP:  117/63 108/81 119/69  ?Pulse:      ?Resp:  '15 18 19  '$ ?Temp:  98 ?F (36.7 ?C) 97.9 ?F (36.6 ?C) 97.9 ?F (36.6 ?C)  ?TempSrc:  Oral Oral Oral  ?SpO2: 100% 100% 100% 100%  ?Weight:   78.3 kg   ?Height:      ? ? ?Intake/Output Summary (Last 24 hours) at 07/15/2021 0843 ?Last data filed at 07/15/2021 2595 ?Gross per 24 hour  ?Intake 510 ml  ?Output 5200 ml  ?Net -4690 ml  ? ?Filed Weights  ? 07/13/21 0416 07/14/21 0411 07/15/21 0504  ?Weight: 82.7 kg 79.4 kg 78.3 kg  ? ? ?Telemetry  ?  ?SR and Mobitz Type HB noted, Rare PVCs- Personally  Reviewed ? ?Physical Exam  ? ?Gen: no distress   ?Neck: He has JVD with nadir of 6-7 cm above the sternum, that is affective by Wenckebach cycles ?Cardiac: No Rubs or Gallops, systolic and diastolic Murmurs noted, largely regular rhythm ?Respiratory: Clear to auscultation bilaterally, norma effort, normal   respiratory rate ?GI: Soft, nontenderly distended with fluid wave ?MS: No  edema ?Integument: Skin feels warm; has dressing for foot wound ?Neuro:  At time of evaluation, alert and oriented to person/place/time/situation  ?Psych: Normal affect, patient feels better ? ? ?Labs  ?  ?Chemistry ?Recent Labs  ?Lab 07/11/21 ?2133 07/12/21 ?6387 07/13/21 ?0356 07/14/21 ?0410 07/15/21 ?0234  ?NA  --  137 137 138 137  ?K  --  4.1 4.0 5.1 4.6  ?CL  --  108 108 105 103  ?CO2  --  22 21* 25 27  ?GLUCOSE  --  87 81 72 73  ?BUN  --  36* 35* 34* 37*  ?CREATININE  --  1.96* 2.01* 1.95* 2.02*  ?CALCIUM  --  8.0* 8.3* 8.5* 8.8*  ?PROT 6.8 5.9* 6.1*  --   --   ?ALBUMIN 2.7* 2.4* 2.6*  --   --   ?AST 34 30 27  --   --   ?  ALT '18 15 14  '$ --   --   ?ALKPHOS 74 67 65  --   --   ?BILITOT 0.9 0.8 0.6  --   --   ?GFRNONAA  --  32* 31* 32* 31*  ?ANIONGAP  --  '7 8 8 7  '$ ?  ? ?Hematology ?Recent Labs  ?Lab 07/13/21 ?0356 07/14/21 ?0410 07/15/21 ?0234  ?WBC 4.8 5.3 5.8  ?RBC 3.01* 3.00* 3.02*  ?HGB 9.2* 9.2* 9.0*  ?HCT 27.4* 27.3* 27.8*  ?MCV 91.0 91.0 92.1  ?MCH 30.6 30.7 29.8  ?MCHC 33.6 33.7 32.4  ?RDW 18.5* 18.5* 17.9*  ?PLT 294 296 331  ? ? ?Cardiac EnzymesNo results for input(s): TROPONINI in the last 168 hours. No results for input(s): TROPIPOC in the last 168 hours.  ? ?BNP ?Recent Labs  ?Lab 07/11/21 ?1828  ?BNP 1,590.3*  ?  ? ?DDimer No results for input(s): DDIMER in the last 168 hours.  ? ?Radiology  ?  ?No results found. ? ?Patient Profile  ?   ?88 y.o. male CAD HFmrEF, Moderate to Severe MR, 1st HB, hx of AFL, nocturnal bradycardia, Htn with DM, RAS s/p L renal stent, CKD stage IIIB, and OSA on CPAP with decompensated  HF ? ?Assessment & Plan  ?  ?Heart Failure Mildly Reduced Ejection Fraction  ?- NYHA class III, Stage C, hyper volemic, etiology unclear  ?Moderate to Severe Central MR ?Severe TR ?Mild to moderate AI ?CKD Stage IIIb ?- Creatinine stable with diuresis ?- Diuresisng welll on Lasix 80 IV BID and Farxiga 10 mg PO daily ?- K WNL ?- holding BB with weekend concerns about bradycardia; EP is consulting today ?- Aldactone held and Creatinine is stable with aggressive diuresis, peri-discharge may be able to start vs outpatient restart ?- ARNI has not been started secondary to blood pressure  will eval peri-discharge for start ?- In the setting debility there were concerns if he is a TEER candidate  ?- If still needing IV diuretics 07/16/21; will get BNP 07/17/21 ? ?AFL ?CAD with prior PCI ?- he was planned for Eliquis and ASA by primary cardiologist because of his AFL and degree of CAD; will continue for know; complicated but new anemia from 04/2021;  ?- Will restart zetia 10 for prevention; I suspect he can return to his home atorvastatin 80 as well but given the question of rosuvastatin swelling and him being unsure will defer at this time ? ?Mobitz 1 HB with intermittent Bradycardia ?AFL ?- seeing EP as above ? ?For questions or updates, please contact [place] ?Please consult [email or web address] for contact info under Cardiology/STEMI. ?  ?   ?Signed, ?Werner Lean, MD  ?07/15/2021, 8:43 AM   ? ?

## 2021-07-15 NOTE — Consult Note (Signed)
? ?ELECTROPHYSIOLOGY CONSULT NOTE  ? ? ?Patient ID: Kevin Schwartz ?MRN: [id number], DOB/AGE: 1930-12-30 88 y.o. ? ?Admit date: 07/11/2021 ?Date of Consult: 07/15/2021 ? ?Primary Physician: [name], MD ?Primary Cardiologist: Glori Bickers, MD  ?Electrophysiologist: Dr. Quentin Ore ? ?Referring Provider: Dr. Harrington Challenger ? ?Patient Profile: ?Kevin Schwartz is a 88 y.o. male with a history of CAD, chronic systolic CHF, Mobitz 1, DM2, HTN, HLD, Atrial flutter, RAS s/p stenting of left renal artery in 2008, MR, CKD III, PAD, OSA on CPAP and hyperparathyroidism who is being seen today for the evaluation of atrial flutter at the request of Dr. Harrington Challenger. ? ?HPI:  ?Kevin Schwartz is a 88 y.o. male with medical history as above.  ? ?Seen previously by Dr. Quentin Ore 11/2020 for abnormal cardiac monitor which showed 39% burden of atrial flutter, as well as occasional junctional rhythm.  At that time, he denied specific symptoms and watchful waiting.    ? ?Beta blocker previously stopped due to intermittent second-degree AV block.  ? ?Pt had COVID 19 and sepsis in 04/2021. Treated for acute respiratory failure, received 10 days of Decadron and 5 days of remdesivir.  He also completed 5 days of ceftriaxone.  Hospitalization complicated by acute on chronic kidney disease, hyponatremia, hyperkalemia, acute on chronic systolic heart failure with moderate to severe MR.  Echocardiogram at that time showed an LVEF of 45 to 50%, moderate to severe MR moderate TR and no AS. He was discharged on 04/28/2021 to SNF ? ?Presented to [facility] 07/11/2021 with SOB and resp distress with O2 sats in the 60s. Labs included: ?Hb 10.4 ?WBC 6.3 ?sCr 2.00 (1.59) - baseline ?1.6 ?K 5.1 --> 4.1 ?HST 54 --> 52 ?BNP 1590 ?Albumin 2.7 ?TSH 36.181 ? ?CXR with small right pleural effusion and bibasilar atelectasis. Cardiology consulted for HF exacerbation. ? ?Pt states he has been SOB for at least 2 months, but feels he has had symptoms predating  his COVID diagnosis.   BB had at some point been restarted. Tele this admission showed Mobitz 1 with pauses up to 3.4 seconds, bradycardia, and junctional beats and was thus stopped.  ? ?EP consulted to see if it is felt his arrhythmias are primary contributors to his overall picture of HF.  ? ?Pt is lying in bed currently without acute distress.  Has boots for heel wounds. Not very active. Denies syncope. Rare tachy-palpitations. States his main issue is SOB that happens even with ADLs. ? ?Past Medical History:  ?Diagnosis Date  ? Anemia due to chronic kidney disease   ? Bilateral inguinal hernia   ? CAD (coronary artery disease) cardiologist-  dr bensimhon  ? PTCA of OM in 1998, BMS OM in 2000, Cath 9/07 LM ok LAD ok. LCX 95% in OM prior to previous stent RCA. nondominant normal EF normal. [place] DES to OM 2007 (PLACED PROXIAMAL TO PREVIOUS STENT)  ? Chronic kidney disease, stage III (moderate) (HCC)   ? nephrologist-  dr detarding  ? Depression   ? Diabetes mellitus type 2, diet-controlled ([place])   ? followed by pcp,  last A1c 5.2 on 09-10-2017 in epic  ? Gout, unspecified   ? 10-29-2017  per pt stable  ? HLD (hyperlipidemia)   ? HTN (hypertension)   ? MGUS (monoclonal gammopathy of unknown significance) previously followed by dr Beryle Beams , [facility] and released 08/01/2011  ? IgG kappa dx 2002 8% plasma cells in bone marrow; no lesions on bone X-rays;  ? Nocturia   ? OA (  osteoarthritis)   ? OSA on CPAP   ? per study 01-30-2004  severe osa , AHI 51.6/hr  ? PAD (peripheral artery disease) ([place])   ? 05-21-2006  left renal artery stenosis, s/p balloon angioplasty and stenting;  last duplex 02/ 2012  normal , arteries patent  ? S/P coronary artery stent placement   ? 2000--  BMS x1  to OM;   2007-- DES x1  to OM proximal to previous stent  ? Secondary hyperparathyroidism of renal origin [facility])   ? Urgency of urination   ? Wears glasses   ?  ? ?Surgical History:  ?Past Surgical History:  ?Procedure Laterality Date  ?  CARDIOVASCULAR STRESS TEST  2010  ? per dr bensimhon epic note dated 04-18-2016  normal  ? [place]  ? PTCA to OM  ? CORONARY ANGIOPLASTY WITH STENT PLACEMENT  2000  ? PCI and BMS x1 OM  ? CORONARY ANGIOPLASTY WITH STENT PLACEMENT  12-18-2005  dr Claiborne Billings  ? DES x1 to proximal to previously placed stent in OM  ? CORONARY BALLOON ANGIOPLASTY N/A 08/02/2020  ? Procedure: CORONARY BALLOON ANGIOPLASTY;  Surgeon: Troy Sine, MD;  Location: [place] [facility];  Service: Cardiovascular;  Laterality: N/A;  ? CORONARY STENT INTERVENTION N/A 08/02/2020  ? Procedure: CORONARY STENT INTERVENTION;  Surgeon: Troy Sine, MD;  Location: [place] [facility];  Service: Cardiovascular;  Laterality: N/A;  ? HERNIA REPAIR    ? INGUINAL HERNIA REPAIR Bilateral 10/30/2017  ? Procedure: LAPAROSCOPIC  BILATERAL INGUINAL HERNIA REPAIR  AND LEFT FEMERAL HERNIA REPAIR;  Surgeon:  Boston, MD;  Location: [place];  Service: General;  Laterality: Bilateral;  ? INSERTION OF MESH Bilateral 10/30/2017  ? Procedure: INSERTION OF MESH;  Surgeon:  Boston, MD;  Location: [facility];  Service: General;  Laterality: Bilateral;  ? LEFT HEART CATH AND CORONARY ANGIOGRAPHY N/A 08/01/2020  ? Procedure: LEFT HEART CATH AND CORONARY ANGIOGRAPHY;  Surgeon: Troy Sine, MD;  Location: [place] [facility];  Service: Cardiovascular;  Laterality: N/A;  ? RENAL ANGIOPLASTY Left 05-21-2006   dr berry  ? and stenting  ? TOTAL KNEE ARTHROPLASTY Left 07-22-2005   dr Shellia Carwin  [place]  ? TRANSTHORACIC ECHOCARDIOGRAM  04/18/2016  ? ef 37-90%, grade 1 diastolic dysfunction/  mild to moderate AR/  mild MR  ?  ? ?Medications Prior to Admission  ?Medication Sig Dispense Refill Last Dose  ? acetaminophen (TYLENOL) 500 MG tablet Take 1,000 mg by mouth every 8 (eight) hours as needed for moderate pain.   unk  ? allopurinol (ZYLOPRIM) 100 MG tablet Take 1 tablet (100 mg total) by mouth daily. 90 tablet 3 07/11/2021   ? apixaban (ELIQUIS) 2.5 MG TABS tablet Take 1 tablet (2.5 mg total) by mouth 2 (two) times daily. 60 tablet  07/11/2021 at 0841  ? aspirin EC 81 MG tablet Take 81 mg by mouth daily. Swallow whole.   07/11/2021  ? atorvastatin (LIPITOR) 80 MG tablet Take 1 tablet (80 mg total) by mouth daily. (Patient taking differently: Take 80 mg by mouth at bedtime.) 90 tablet 3 07/10/2021  ? carvedilol (COREG) 3.125 MG tablet Take 3.125 mg by mouth 2 (two) times daily. Do not give if SBP <110 or HR <60   07/11/2021 at 0841  ? cetirizine (ZYRTEC) 10 MG tablet Take 10 mg by mouth at bedtime.   07/10/2021  ? citalopram (CELEXA) 20 MG tablet Take 1 tablet (20 mg total)  by mouth daily. 90 tablet 3 07/11/2021  ? diphenhydrAMINE (BENADRYL) 25 MG tablet Take 25 mg by mouth 2 (two) times daily as needed for itching.   unk  ? Emollient (EUCERIN) lotion Apply 1 application. topically daily. Apply to rash on  chest, abdomen, back,neck and shoulders   07/10/2021  ? ezetimibe (ZETIA) 10 MG tablet Take 10 mg by mouth daily.   07/11/2021  ? furosemide (LASIX) 20 MG tablet TAKE 1 TABLET BY MOUTH EVERY DAY (Patient taking differently: Take 10 mg by mouth daily.) 90 tablet 1 07/11/2021  ? guaiFENesin (MUCINEX) 600 MG 12 hr tablet Take 600 mg by mouth 2 (two) times daily.   07/11/2021  ? ipratropium-albuterol (DUONEB) 0.5-2.5 (3) MG/3ML SOLN Inhale 3 mLs into the lungs 3 (three) times daily.   07/11/2021  ? lidocaine (LIDODERM) 5 % Place 1 patch onto the skin daily. Remove & Discard patch within 12 hours or as directed by MD (Patient taking differently: Place 1 patch onto the skin See admin instructions. Apply to left knee in the morning,remove at bedtime) 5 patch 0 07/11/2021  ? nitroGLYCERIN (NITROSTAT) 0.4 MG SL tablet DISSOLVE 1 TABLET UNDER THE TONGUE EVERY 5 MINUTES AS  NEEDED FOR CHEST PAIN. MAX  OF 3 TABLETS IN 15 MINUTES. CALL 911 IF PAIN PERSISTS. (Patient taking differently: Place 0.4 mg under the tongue every 5 (five) minutes as needed for chest  pain.) 75 tablet 1 unk  ? spironolactone (ALDACTONE) 25 MG tablet Take 12.5 mg by mouth every other day. Do no give if SBP <110   07/10/2021  ? tamsulosin (FLOMAX) 0.4 MG CAPS capsule Take 0.4 mg by mouth daily.

## 2021-07-15 NOTE — TOC Progression Note (Signed)
Transition of Care (TOC) - Progression Note  ? ? ?Patient Details  ?Name: Kevin Schwartz ?MRN: [id number] ?Date of Birth: 08/17/1930 ? ?Transition of Care (TOC) CM/SW Contact  ?[place], LCSW ?Phone Number: ?07/15/2021, 11:07 AM ? ?Clinical Narrative:    ? ?CSW notified [place] SNF liaison of pt's DC in next few days; they will start SNF auth.  ? ?Expected Discharge Plan: [place] ?Barriers to Discharge: Continued Medical Work up, Ship broker ? ?Expected Discharge Plan and Services ?Expected Discharge Plan: [place] ?  ?  ?Post Acute Care Choice: [place] ?Living arrangements for the past 2 months: [place] ?                ?  ?  ?  ?  ?  ?  ?  ?  ?  ?  ? ? ?Social Determinants of Health (SDOH) Interventions ?  ? ?Readmission Risk Interventions ?   ? View : No data to display.  ?  ?  ?  ? ? ?

## 2021-07-16 DIAGNOSIS — I5043 Acute on chronic combined systolic (congestive) and diastolic (congestive) heart failure: Secondary | ICD-10-CM | POA: Diagnosis not present

## 2021-07-16 LAB — CBC
HCT: 26.9 % — ABNORMAL LOW (ref 39.0–52.0)
Hemoglobin: 8.9 g/dL — ABNORMAL LOW (ref 13.0–17.0)
MCH: 30.2 pg (ref 26.0–34.0)
MCHC: 33.1 g/dL (ref 30.0–36.0)
MCV: 91.2 fL (ref 80.0–100.0)
Platelets: 329 10*3/uL (ref 150–400)
RBC: 2.95 MIL/uL — ABNORMAL LOW (ref 4.22–5.81)
RDW: 18 % — ABNORMAL HIGH (ref 11.5–15.5)
WBC: 7.1 10*3/uL (ref 4.0–10.5)
nRBC: 0.3 % — ABNORMAL HIGH (ref 0.0–0.2)

## 2021-07-16 LAB — BASIC METABOLIC PANEL
Anion gap: 7 (ref 5–15)
BUN: 42 mg/dL — ABNORMAL HIGH (ref 8–23)
CO2: 29 mmol/L (ref 22–32)
Calcium: 8.8 mg/dL — ABNORMAL LOW (ref 8.9–10.3)
Chloride: 101 mmol/L (ref 98–111)
Creatinine, Ser: 2.06 mg/dL — ABNORMAL HIGH (ref 0.61–1.24)
GFR, Estimated: 30 mL/min — ABNORMAL LOW (ref >60)
Glucose, Bld: 79 mg/dL (ref 70–99)
Potassium: 4.5 mmol/L (ref 3.5–5.1)
Sodium: 137 mmol/L (ref 135–145)

## 2021-07-16 LAB — GLUCOSE, CAPILLARY
Glucose-Capillary: 83 mg/dL (ref 70–99)
Glucose-Capillary: 121 mg/dL — ABNORMAL HIGH (ref 70–99)
Glucose-Capillary: 91 mg/dL (ref 70–99)
Glucose-Capillary: 86 mg/dL (ref 70–99)

## 2021-07-16 NOTE — Progress Notes (Signed)
? ?Progress Note ? ?Patient Name: Kevin Schwartz ?Date of Encounter: 07/16/2021 ? ?Primary Cardiologist: Glori Bickers, MD  ? ?Subjective  ? ?No events overnight. ?- 3.9 L in interval. ? ?Up with therapy and is doing well.  Similar creatinine (2.02-2.06) ?Breathing is further improved. ?No CP, No palpitations. ? ?Inpatient Medications  ?  ?Scheduled Meds: ? allopurinol  100 mg Oral Daily  ? apixaban  2.5 mg Oral BID  ? aspirin EC  81 mg Oral Daily  ? citalopram  20 mg Oral Daily  ? dapagliflozin propanediol  10 mg Oral Daily  ? ezetimibe  10 mg Oral QHS  ? feeding supplement  237 mL Oral BID BM  ? furosemide  80 mg Intravenous BID  ? guaiFENesin  600 mg Oral BID  ? insulin aspart  0-9 Units Subcutaneous TID WC  ? ipratropium-albuterol  3 mL Nebulization BID  ? levothyroxine  50 mcg Oral Q0600  ? multivitamin with minerals  1 tablet Oral Daily  ? sodium chloride flush  3 mL Intravenous Q12H  ? tamsulosin  0.4 mg Oral Daily  ? ?Continuous Infusions: ? sodium chloride    ? ?PRN Meds: ?sodium chloride, acetaminophen **OR** acetaminophen, albuterol, HYDROcodone-acetaminophen, sodium chloride flush  ? ?Vital Signs  ?  ?Vitals:  ? 07/15/21 2009 07/16/21 4315 07/16/21 0804 07/16/21 0836  ?BP: 109/77 101/63 106/67   ?Pulse: (!) 105 89 94   ?Resp: '17 16 13   '$ ?Temp: 98.7 ?F (37.1 ?C) 97.6 ?F (36.4 ?C) 97.6 ?F (36.4 ?C)   ?TempSrc: Oral Oral Oral   ?SpO2: 95% 95% 100% 97%  ?Weight:  74.3 kg    ?Height:      ? ? ?Intake/Output Summary (Last 24 hours) at 07/16/2021 0944 ?Last data filed at 07/16/2021 0827 ?Gross per 24 hour  ?Intake 1088 ml  ?Output 5200 ml  ?Net -4112 ml  ? ?Filed Weights  ? 07/14/21 0411 07/15/21 0504 07/16/21 0347  ?Weight: 79.4 kg 78.3 kg 74.3 kg  ? ? ?Telemetry  ?  ?SR and Mobitz Type HB noted - Personally Reviewed ? ?Physical Exam  ? ?Gen: no distress   ?Neck: He has JVD with nadir of 5 cm above the sternum, that is affective by Wenckebach cycles (still present) ?Cardiac: No Rubs or Gallops, systolic  and diastolic Murmurs noted ?Respiratory: Clear to auscultation bilaterally, norma effort, normal   respiratory rate (off 02) ?GI: Soft, nontenderly distended without fluid wave ?MS: No  edema ?Integument: Skin feels warm; has dressing for foot wound ?Neuro:  At time of evaluation, alert and oriented to person/place/time/situation  ?Psych: Normal affect, patient feels better ? ? ?Labs  ?  ?Chemistry ?Recent Labs  ?Lab 07/11/21 ?2133 07/12/21 ?4008 07/13/21 ?0356 07/14/21 ?0410 07/15/21 ?0234 07/16/21 ?0407  ?NA  --  137 137 138 137 137  ?K  --  4.1 4.0 5.1 4.6 4.5  ?CL  --  108 108 105 103 101  ?CO2  --  22 21* '25 27 29  '$ ?GLUCOSE  --  87 81 72 73 79  ?BUN  --  36* 35* 34* 37* 42*  ?CREATININE  --  1.96* 2.01* 1.95* 2.02* 2.06*  ?CALCIUM  --  8.0* 8.3* 8.5* 8.8* 8.8*  ?PROT 6.8 5.9* 6.1*  --   --   --   ?ALBUMIN 2.7* 2.4* 2.6*  --   --   --   ?AST 34 30 27  --   --   --   ?ALT 18 15  14  --   --   --   ?ALKPHOS 74 67 65  --   --   --   ?BILITOT 0.9 0.8 0.6  --   --   --   ?GFRNONAA  --  32* 31* 32* 31* 30*  ?ANIONGAP  --  '7 8 8 7 7  '$ ?  ? ?Hematology ?Recent Labs  ?Lab 07/14/21 ?0410 07/15/21 ?0234 07/16/21 ?0407  ?WBC 5.3 5.8 7.1  ?RBC 3.00* 3.02* 2.95*  ?HGB 9.2* 9.0* 8.9*  ?HCT 27.3* 27.8* 26.9*  ?MCV 91.0 92.1 91.2  ?MCH 30.7 29.8 30.2  ?MCHC 33.7 32.4 33.1  ?RDW 18.5* 17.9* 18.0*  ?PLT 296 331 329  ? ? ?Cardiac EnzymesNo results for input(s): TROPONINI in the last 168 hours. No results for input(s): TROPIPOC in the last 168 hours.  ? ?BNP ?Recent Labs  ?Lab 07/11/21 ?1828  ?BNP 1,590.3*  ?  ? ?DDimer No results for input(s): DDIMER in the last 168 hours.  ? ?Radiology  ?  ?No results found. ? ?Patient Profile  ?   ?88 y.o. male CAD HFmrEF, Moderate to Severe MR, 1st HB, hx of AFL, nocturnal bradycardia, Htn with DM, RAS s/p L renal stent, CKD stage IIIB, and OSA on CPAP with decompensated HF ? ?Assessment & Plan  ?  ?Heart Failure Mildly Reduced Ejection Fraction  ?- NYHA class III, Stage C, hyper volemic,  etiology unclear  ?Moderate to Severe Central MR ?Severe TR ?Mild to moderate AI ?CKD Stage IIIb ?- Creatinine stable with diuresis ?- Diuresisng welll on Lasix 80 IV BID and Farxiga 10 mg PO daily ?- K WNL ?- potential Torsemide 40 mg PO daily at discharge ?- Aldactone held and Creatinine is stable with aggressive diuresis, peri-discharge may be able to start vs outpatient restart ?- ARNI has not been started secondary to blood pressure  will eval peri-discharge for start ( suspect outpatient if we attempt this) ?- In the setting debility there were concerns if he is a TEER candidate ([place]) ?- continues to improves, will get BNP tomorrow for potential transition to PO diuretics ? ?AFL ?CAD with prior PCI ?- he was planned for Eliquis and ASA by primary cardiologist because of his AFL and degree of CAD; his anemia is new but stable; at outpatient eval may need to re-evaluate this ?-zetia 10 mg; outpatient return to high intensity statin ? ?Mobitz 1 HB with intermittent Bradycardia ?AFL ?- no plans for beta blocker or procedural therapy ? ?For questions or updates, please contact [place] ?Please consult [email or web address] for contact info under Cardiology/STEMI. ?  ?   ?Signed, ?Werner Lean, MD  ?07/16/2021, 9:44 AM   ? ?

## 2021-07-16 NOTE — Progress Notes (Signed)
?PROGRESS NOTE ? ? ? ?Kevin Schwartz  OLM:[id number] DOB: 10-Oct-1930 DOA: 07/11/2021 ?PCP: [name], MD  ?Narrative 89/M with history of CAD, multiple PCI/stents, ischemic cardiomyopathy EF of 45%, severe PAH, mitral regurgitation, type 2 diabetes mellitus, hypertension, dyslipidemia, CKD 3a, history of renal artery stenosis status post stenting in 2008, paroxysmal atrial flutter on Eliquis, history of recurrent epistaxis was brought to the ED 3/30 with respiratory distress.  Chest x-ray noted basilar atelectasis, small pleural effusion, labs noted creatinine of 2.0, BNP 1590, troponin of 50 ?-Started on diuretics, diuresing, cardiology following ? ?Subjective: ?-Feels better overall, breathing continues to improve ? ?Assessment & Plan: ? ?Acute on chronic systolic CHF/ICM ?Mod to severe MR ?Severe Pulm Hypertension ?-Last echo in 4/22 with EF of 45% ?-Diuresed with IV Lasix, he is 12.4 L negative ?-Cardiology following, ?transition to oral diuretics  ?-Continue carvedilol, [place] resumed ?-Creatinine stable, EP consulted on account of atrial arrhythmias, recommended current management ?-Monitor I's/O, daily weights, GDMT limited by CKD  ?-PT OT-following, discharge planning, back to [facility] ? ?AKI on CKD 3a ?-Baseline creatinine around 1.6-2, creatinine now 2.0 ?-Stable, monitor with diuresis ? ?History of CAD ?-had NSTEMI in 4/22, cath noted multivessel CAD- 85% proximal RCA, 85% mid RCA, 90% distal RCA, 80% mid LAD, 80% OM 2 lesion, on 08/02/2020 underwent staged PCI with DES to the mid LAD, balloon angioplasty of left circumflex lesion, the diffusely diseased RCA was treated medically. ?-Continue Plavix, Eliquis, statin ?-Troponins minimally elevated, no symptoms of ACS ?  ?Paroxysmal Atrial Flutter ?-Continue Eliquis, carvedilol ? ?Abnormal TSH ?-Significantly elevated at 35-repeat elevated as well, patient unable to provide any information about history of hypothyroidism or Synthroid  use, T4 and T3 were normal ?-Started on low-dose 50 mcg Synthroid ?-Recheck TSH in 6 weeks ?  ?History of recurrent epistaxis ?-Ongoing frequent issue, complicated by Plavix, Eliquis use ?-Recently had anterior nasal packing this was removed 2 days ago ? ?Iron Defi anemia ?-Continue IV iron, hemoglobin stable now ? ?T2DM ?-CBG stable, started on Farxiga ? ?H/o MGUS ?-fu with Heme ? ?L foot wound ?-Woc COnsult appreciated ? ?DVT prophylaxis:apixaban ?Code Status: Discussed CODE STATUS with the patient, wishes to remain full code ?Family Communication: Discussed with patient in detail, no family at bedside ?Disposition Plan: Back to SNF tomorrow if stable ? ?Consultants:  ? ? ?Procedures:  ? ?Antimicrobials:  ? ? ?Objective: ?Vitals:  ? 07/15/21 2009 07/16/21 0100 07/16/21 0804 07/16/21 0836  ?BP: 109/77 101/63 106/67   ?Pulse: (!) 105 89 94   ?Resp: '17 16 13   '$ ?Temp: 98.7 ?F (37.1 ?C) 97.6 ?F (36.4 ?C) 97.6 ?F (36.4 ?C)   ?TempSrc: Oral Oral Oral   ?SpO2: 95% 95% 100% 97%  ?Weight:  74.3 kg    ?Height:      ? ? ?Intake/Output Summary (Last 24 hours) at 07/16/2021 1225 ?Last data filed at 07/16/2021 0827 ?Gross per 24 hour  ?Intake 1085 ml  ?Output 3900 ml  ?Net -2815 ml  ? ?Filed Weights  ? 07/14/21 0411 07/15/21 0504 07/16/21 0347  ?Weight: 79.4 kg 78.3 kg 74.3 kg  ? ? ?Examination: ? ?General exam: Pleasant elderly male sitting up in bed, AAOx3, no distress, hard of hearing ?HEENT: No JVD ?CVS: S1-S2, regular rhythm ?Lungs: Few basilar Rales otherwise clear ?Abdomen: Soft, obese, nontender, bowel sounds present  ?Extremities: Trace edema, left foot wound with dressing  ?Psychiatry: Mood & affect appropriate.  ? ?Data Reviewed:  ? ?CBC: ?Recent Labs  ?  Lab 07/11/21 ?1827 07/11/21 ?2313 07/12/21 ?2778 07/13/21 ?0356 07/14/21 ?0410 07/15/21 ?0234 07/16/21 ?0407  ?WBC 6.3  --  4.7 4.8 5.3 5.8 7.1  ?NEUTROABS 3.6 3.7 3.0  --   --   --   --   ?HGB 10.4*  --  8.8* 9.2* 9.2* 9.0* 8.9*  ?HCT 32.8*  --  27.1* 27.4* 27.3* 27.8*  26.9*  ?MCV 95.1  --  93.4 91.0 91.0 92.1 91.2  ?PLT 317  --  297 294 296 331 329  ? ?Basic Metabolic Panel: ?Recent Labs  ?Lab 07/11/21 ?2133 07/12/21 ?2423 07/13/21 ?0356 07/14/21 ?0410 07/15/21 ?0234 07/16/21 ?0407  ?NA  --  137 137 138 137 137  ?K  --  4.1 4.0 5.1 4.6 4.5  ?CL  --  108 108 105 103 101  ?CO2  --  22 21* '25 27 29  '$ ?GLUCOSE  --  87 81 72 73 79  ?BUN  --  36* 35* 34* 37* 42*  ?CREATININE  --  1.96* 2.01* 1.95* 2.02* 2.06*  ?CALCIUM  --  8.0* 8.3* 8.5* 8.8* 8.8*  ?MG 1.9 1.7  --   --   --   --   ?PHOS 3.1 3.0  --   --   --   --   ? ?GFR: ?Estimated Creatinine Clearance: 24 mL/min (A) (by C-G formula based on SCr of 2.06 mg/dL (H)). ?Liver Function Tests: ?Recent Labs  ?Lab 07/11/21 ?1827 07/11/21 ?2133 07/12/21 ?5361 07/13/21 ?0356  ?AST 39 34 30 27  ?ALT '18 18 15 14  '$ ?ALKPHOS 80 74 67 65  ?BILITOT 0.8 0.9 0.8 0.6  ?PROT 6.7 6.8 5.9* 6.1*  ?ALBUMIN 2.8* 2.7* 2.4* 2.6*  ? ?No results for input(s): LIPASE, AMYLASE in the last 168 hours. ?No results for input(s): AMMONIA in the last 168 hours. ?Coagulation Profile: ?No results for input(s): INR, PROTIME in the last 168 hours. ?Cardiac Enzymes: ?Recent Labs  ?Lab 07/11/21 ?2133  ?CKTOTAL 174  ? ?BNP (last 3 results) ?No results for input(s): PROBNP in the last 8760 hours. ?HbA1C: ?No results for input(s): HGBA1C in the last 72 hours. ?CBG: ?Recent Labs  ?Lab 07/15/21 ?1132 07/15/21 ?1706 07/15/21 ?2151 07/16/21 ?0615 07/16/21 ?1106  ?GLUCAP 104* 120* 114* 83 121*  ? ?Lipid Profile: ?No results for input(s): CHOL, HDL, LDLCALC, TRIG, CHOLHDL, LDLDIRECT in the last 72 hours. ?Thyroid Function Tests: ?No results for input(s): TSH, T4TOTAL, FREET4, T3FREE, THYROIDAB in the last 72 hours. ? ?Anemia Panel: ?No results for input(s): VITAMINB12, FOLATE, FERRITIN, TIBC, IRON, RETICCTPCT in the last 72 hours. ? ?Urine analysis: ?   ?Component Value Date/Time  ? [place] YELLOW 04/17/2021 0117  ? APPEARANCEUR CLEAR 04/17/2021 0117  ? LABSPEC >1.030 (H) 04/17/2021  0117  ? PHURINE 5.5 04/17/2021 0117  ? GLUCOSEU NEGATIVE 04/17/2021 0117  ? HGBUR SMALL (A) 04/17/2021 0117  ? BILIRUBINUR MODERATE (A) 04/17/2021 0117  ? [place] NEGATIVE 04/17/2021 0117  ? PROTEINUR >300 (A) 04/17/2021 0117  ? NITRITE NEGATIVE 04/17/2021 0117  ? LEUKOCYTESUR NEGATIVE 04/17/2021 0117  ? ?Sepsis Labs: ?'@LABRCNTIP'$ (procalcitonin:4,lacticidven:4) ? ?) ?Recent Results (from the past 240 hour(s))  ?Resp Panel by RT-PCR (Flu A&B, Covid) Nasopharyngeal Swab     Status: None  ? Collection Time: 07/11/21  5:40 PM  ? Specimen: Nasopharyngeal Swab; Nasopharyngeal(NP) swabs in vial transport medium  ?Result Value Ref Range Status  ? SARS Coronavirus 2 by RT PCR NEGATIVE NEGATIVE Final  ?  Comment: (NOTE) ?SARS-CoV-2 target nucleic acids are NOT DETECTED. ? ?  The SARS-CoV-2 RNA is generally detectable in upper respiratory ?specimens during the acute phase of infection. The lowest ?concentration of SARS-CoV-2 viral copies this assay can detect is ?138 copies/mL. A negative result does not preclude SARS-Cov-2 ?infection and should not be used as the sole basis for treatment or ?other patient management decisions. A negative result may occur with  ?improper specimen collection/handling, submission of specimen other ?than nasopharyngeal swab, presence of viral mutation(s) within the ?areas targeted by this assay, and inadequate number of viral ?copies(<138 copies/mL). A negative result must be combined with ?clinical observations, patient history, and epidemiological ?information. The expected result is Negative. ? ?Fact Sheet for Patients:  ?EntrepreneurPulse.com.au ? ?Fact Sheet for Healthcare Providers:  ?IncredibleEmployment.be ? ?This test is no t yet approved or cleared by the [place] FDA and  ?has been authorized for detection and/or diagnosis of SARS-CoV-2 by ?FDA under an Emergency Use Authorization (EUA). This EUA will remain  ?in effect (meaning this test can be  used) for the duration of the ?COVID-19 declaration under Section 564(b)(1) of the Act, 21 ?U.S.C.section 360bbb-3(b)(1), unless the authorization is terminated  ?or revoked sooner.  ? ? ?  ? Influenza A

## 2021-07-16 NOTE — Progress Notes (Signed)
OT Cancellation Note ? ?Patient Details ?Name: Kevin Schwartz ?MRN: [id number] ?DOB: [birth year] ? ? ?Cancelled Treatment:    Reason Eval/Treat Not Completed: Other (comment) (Pt declined therapy as she walked with PT earlier. Pt stating "Ill do it tomorrow.") Will return as schedule allows.  ? ?[place] ?Jordell Outten MSOT, OTR/L ?Acute Rehab ?Pager: [phone] ?Office: [phone] ?07/16/2021, 3:11 PM ?

## 2021-07-16 NOTE — Progress Notes (Signed)
Physical Therapy Treatment ?Patient Details ?Name: Kevin Schwartz ?MRN: [id number] ?DOB: 1930-11-01 ?Today's Date: 07/16/2021 ? ? ?History of Present Illness 88 yo M adm 07/11/21 from SNF [facility] due to respiratory distress with pleural effusion and L foot wound. PMHx:CHF, CAD, CKD, HTN, DM2, and BPH. ? ?  ?PT Comments  ? ? Pt pleasant, HOH and able to progress to standing and OOB to chair with limited gait this session. Pt reports heel pain but able to stand and step. Pt educated for HEP and activity progression. D/C plan remains appropriate. Will continue to follow.  ?   ?Recommendations for follow up therapy are one component of a multi-disciplinary discharge planning process, led by the attending physician.  Recommendations may be updated based on patient status, additional functional criteria and insurance authorization. ? ?Follow Up Recommendations ? Skilled nursing-short term rehab (<3 hours/day) ?  ?  ?Assistance Recommended at Discharge Frequent or constant Supervision/Assistance  ?Patient can return home with the following Assist for transportation;Help with stairs or ramp for entrance;Assistance with cooking/housework;A lot of help with walking and/or transfers;A lot of help with bathing/dressing/bathroom ?  ?Equipment Recommendations ? Wheelchair (measurements PT);Wheelchair cushion (measurements PT)  ?  ?Recommendations for Other Services   ? ? ?  ?Precautions / Restrictions Precautions ?Precautions: Fall  ?  ? ?Mobility ? Bed Mobility ?Overal bed mobility: Needs Assistance ?Bed Mobility: Supine to Sit ?  ?  ?Supine to sit: Min assist, HOB elevated ?  ?  ?General bed mobility comments: min assist to fully elevate trunk with HOB 20 degrees. Pt attempted to don slipper and could reach toward foot but could not don with max assist for bil slippers ?  ? ?Transfers ?Overall transfer level: Needs assistance ?  ?Transfers: Sit to/from Stand, Bed to chair/wheelchair/BSC ?Sit to Stand: Min assist ?   ?Step pivot transfers: Min assist ?  ?  ?  ?General transfer comment: min assist to rise from bed with cues for hand placement and from chair. Pivot with RW with min assist ?  ? ?Ambulation/Gait ?Ambulation/Gait assistance: Min assist, +2 safety/equipment ?Gait Distance (Feet): 5 Feet ?Assistive device: Rolling walker (2 wheels) ?Gait Pattern/deviations: Step-through pattern, Decreased stride length, Trunk flexed ?  ?Gait velocity interpretation: <1.8 ft/sec, indicate of risk for recurrent falls ?  ?General Gait Details: cues for posture and safety with pt able to step 5' with close chair follow and limited by foot pain ? ? ?Stairs ?  ?  ?  ?  ?  ? ? ?Wheelchair Mobility ?  ? ?Modified Rankin (Stroke Patients Only) ?  ? ? ?  ?Balance Overall balance assessment: Needs assistance ?Sitting-balance support: No upper extremity supported, Feet supported ?Sitting balance-[name]: Fair ?Sitting balance - Comments: EOb without support ?  ?Standing balance support: Bilateral upper extremity supported ?Standing balance-[name]: Poor ?Standing balance comment: RW for standing ?  ?  ?  ?  ?  ?  ?  ?  ?  ?  ?  ?  ? ?  ?Cognition Arousal/Alertness: Awake/alert ?Behavior During Therapy: [facility] for tasks assessed/performed ?Overall Cognitive Status: Impaired/Different from baseline ?Area of Impairment: Safety/judgement ?  ?  ?  ?  ?  ?  ?  ?  ?  ?  ?  ?  ?Safety/Judgement: Decreased awareness of deficits ?  ?  ?  ?  ?  ? ?  ?Exercises General Exercises - Lower Extremity ?Short Arc Quad: AROM, Both, Seated, 15 reps ?Hip Flexion/Marching: AROM, Both, Seated,  15 reps ? ?  ?General Comments   ?  ?  ? ?Pertinent Vitals/Pain Pain Assessment ?Pain Score: 4  ?Pain Location: left heel ?Pain Descriptors / Indicators: Aching, Guarding ?Pain Intervention(s): Limited activity within patient's tolerance, Monitored during session, Repositioned  ? ? ?Home Living   ?  ?  ?  ?  ?  ?  ?  ?  ?  ?   ?  ?Prior Function    ?  ?  ?   ? ?PT Goals  (current goals can now be found in the care plan section) Progress towards PT goals: Progressing toward goals ? ?  ?Frequency ? ? ? Min 2X/week ? ? ? ?  ?PT Plan Current plan remains appropriate  ? ? ?Co-evaluation   ?  ?  ?  ?  ? ?  ?AM-PAC PT "6 Clicks" Mobility   ?Outcome Measure ? Help needed turning from your back to your side while in a flat bed without using bedrails?: A Little ?Help needed moving from lying on your back to sitting on the side of a flat bed without using bedrails?: A Little ?Help needed moving to and from a bed to a chair (including a wheelchair)?: A Little ?Help needed standing up from a chair using your arms (e.g., wheelchair or bedside chair)?: A Lot ?Help needed to walk in hospital room?: Total ?Help needed climbing 3-5 steps with a railing? : Total ?6 Click Score: 13 ? ?  ?End of Session Equipment Utilized During Treatment: Gait belt ?Activity Tolerance: Patient tolerated treatment well ?Patient left: in chair;with call bell/phone within reach;with chair alarm set;with nursing/sitter in room ?Nurse Communication: Mobility status ?PT Visit Diagnosis: Unsteadiness on feet (R26.81);Other abnormalities of gait and mobility (R26.89);Pain ?  ? ? ?Time: 2725-3664 ?PT Time Calculation (min) (ACUTE ONLY): 25 min ? ?Charges:  $Therapeutic Exercise: 8-22 mins ?$Therapeutic Activity: 8-22 mins          ?          ? ?Carine Nordgren P, PT ?Acute Rehabilitation Services ?Pager: [phone] ?Office: [phone] ? ? ? ?Shareese Macha B Alylah Blakney ?07/16/2021, 10:06 AM ? ?

## 2021-07-17 DIAGNOSIS — I5043 Acute on chronic combined systolic (congestive) and diastolic (congestive) heart failure: Secondary | ICD-10-CM | POA: Diagnosis not present

## 2021-07-17 LAB — BASIC METABOLIC PANEL
Anion gap: 8 (ref 5–15)
BUN: 50 mg/dL — ABNORMAL HIGH (ref 8–23)
CO2: 30 mmol/L (ref 22–32)
Calcium: 8.9 mg/dL (ref 8.9–10.3)
Chloride: 95 mmol/L — ABNORMAL LOW (ref 98–111)
Creatinine, Ser: 2 mg/dL — ABNORMAL HIGH (ref 0.61–1.24)
GFR, Estimated: 32 mL/min — ABNORMAL LOW (ref >60)
Glucose, Bld: 77 mg/dL (ref 70–99)
Potassium: 4.4 mmol/L (ref 3.5–5.1)
Sodium: 133 mmol/L — ABNORMAL LOW (ref 135–145)

## 2021-07-17 LAB — GLUCOSE, CAPILLARY
Glucose-Capillary: 79 mg/dL (ref 70–99)
Glucose-Capillary: 93 mg/dL (ref 70–99)
Glucose-Capillary: 143 mg/dL — ABNORMAL HIGH (ref 70–99)
Glucose-Capillary: 101 mg/dL — ABNORMAL HIGH (ref 70–99)

## 2021-07-17 LAB — BRAIN NATRIURETIC PEPTIDE: B Natriuretic Peptide: 1079.2 pg/mL — ABNORMAL HIGH (ref 0.0–100.0)

## 2021-07-17 MED ORDER — SPIRONOLACTONE 12.5 MG HALF TABLET
12.5000 mg | ORAL_TABLET | Freq: Every day | ORAL | Status: DC
Start: 2021-07-18 — End: 2021-07-18

## 2021-07-17 MED ORDER — HYDROMORPHONE HCL 1 MG/ML IJ SOLN
0.5000 mg | INTRAMUSCULAR | Status: DC | PRN
  Administered 2021-07-17: 0.5 mg via INTRAVENOUS
  Filled 2021-07-17: qty 0.5

## 2021-07-17 MED ORDER — TORSEMIDE 20 MG PO TABS
40.0000 mg | ORAL_TABLET | Freq: Every day | ORAL | Status: DC
  Administered 2021-07-17: 40 mg via ORAL
  Filled 2021-07-17: qty 2

## 2021-07-17 NOTE — Plan of Care (Signed)
  Problem: Education: Goal: Ability to demonstrate management of disease process will improve Outcome: Progressing Goal: Ability to verbalize understanding of medication therapies will improve Outcome: Progressing   Problem: Activity: Goal: Capacity to carry out activities will improve Outcome: Progressing   

## 2021-07-17 NOTE — Progress Notes (Addendum)
?PROGRESS NOTE ? ? ? ?Kevin Schwartz  YHC:[id number] DOB: [birth year] DOA: 07/11/2021 ?PCP: [name], MD  ?Narrative 89/M with history of CAD, multiple PCI/stents, ischemic cardiomyopathy EF of 45%, severe PAH, mitral regurgitation, type 2 diabetes mellitus, hypertension, dyslipidemia, CKD 3a, history of renal artery stenosis status post stenting in 2008, paroxysmal atrial flutter on Eliquis, history of recurrent epistaxis was brought to the ED 3/30 with respiratory distress.  Chest x-ray noted basilar atelectasis, small pleural effusion, labs noted creatinine of 2.0, BNP 1590, troponin of 50 ?-Started on diuretics, diuresing, cardiology following ? ?Subjective: ?-Feels better overall, breathing continues to improve ? ?Assessment & Plan: ? ?Acute on chronic systolic CHF/ICM ?Mod to severe MR ?Severe Pulm Hypertension ?-Last echo in 4/22 with EF of 45% ?-Diuresed with IV Lasix, he is 14 L negative ?-Cardiology following switch to oral torsemide today ?-Continue carvedilol, Farxiga resumed ?-Creatinine stable, EP consulted on account of atrial arrhythmias, recommended current management ?-GDMT limited by CKD  ?-Discharge planning, back to SNF tomorrow if stable, discussed CODE STATUS with the patient again, he is agreeable to DNR, will benefit from palliative care follow-up at SNF ? ?AKI on CKD 3a ?-Baseline creatinine around 1.6-2, creatinine now 2.0 ?-Stable, monitor with diuresis ? ?History of CAD ?-had NSTEMI in 4/22, cath noted multivessel CAD- 85% proximal RCA, 85% mid RCA, 90% distal RCA, 80% mid LAD, 80% OM 2 lesion, on 08/02/2020 underwent staged PCI with DES to the mid LAD, balloon angioplasty of left circumflex lesion, the diffusely diseased RCA was treated medically. ?-Continue Plavix, Eliquis, statin ?-Troponins minimally elevated, no symptoms of ACS ?  ?Paroxysmal Atrial Flutter ?-Continue Eliquis, carvedilol ? ?Abnormal TSH ?-Significantly elevated at 35-repeat elevated as well, patient  unable to provide any information about history of hypothyroidism or Synthroid use, T4 and T3 were normal ?-Started on low-dose 50 mcg Synthroid ?-Recheck TSH in 6 weeks ?  ?History of recurrent epistaxis ?-Ongoing frequent issue, complicated by Plavix, Eliquis use ?-Recently had anterior nasal packing this was removed 2 days ago ? ?Iron Defi anemia ?-Continue IV iron, hemoglobin stable now ? ?T2DM ?-CBG stable, started on Farxiga ? ?H/o MGUS ?-fu with Heme ? ?L foot wound ?-Woc COnsult appreciated ? ?DVT prophylaxis:apixaban ?Code Status: DO NOT RESUSCITATE ?Family Communication: Called and updated patient's son [name] ?Disposition Plan: Back to SNF tomorrow if stable ? ?Consultants:  ? ? ?Procedures:  ? ?Antimicrobials:  ? ? ?Objective: ?Vitals:  ? 07/17/21 0503 07/17/21 0742 07/17/21 0800 07/17/21 1042  ?BP:   116/66 104/77  ?Pulse:  97 96 94  ?Resp:  19 15 (!) 21  ?Temp:    (!) 97.5 ?F (36.4 ?C)  ?TempSrc:    Oral  ?SpO2:  100% 98% 100%  ?Weight: 75.2 kg     ?Height:      ? ? ?Intake/Output Summary (Last 24 hours) at 07/17/2021 1409 ?Last data filed at 07/17/2021 1255 ?Gross per 24 hour  ?Intake 797 ml  ?Output 2550 ml  ?Net -1753 ml  ? ?Filed Weights  ? 07/15/21 0504 07/16/21 0347 07/17/21 0503  ?Weight: 78.3 kg 74.3 kg 75.2 kg  ? ? ?Examination: ? ?General exam: Pleasant elderly male sitting up in bed, AAO x3, hard of hearing, no distress ?HEENT: No JVD ?CVS: S1-S2, regular rate rhythm ?Lungs: Rare basilar Rales otherwise clear  ?Abdomen: Soft, obese, nontender, bowel sounds present  ?Extremities: Trace edema, left foot wound with dressing  ?Psychiatry: Mood & affect appropriate.  ? ?Data Reviewed:  ? ?CBC: ?Recent  Labs  ?Lab 07/11/21 ?1827 07/11/21 ?2313 07/12/21 ?4315 07/13/21 ?0356 07/14/21 ?0410 07/15/21 ?0234 07/16/21 ?0407  ?WBC 6.3  --  4.7 4.8 5.3 5.8 7.1  ?NEUTROABS 3.6 3.7 3.0  --   --   --   --   ?HGB 10.4*  --  8.8* 9.2* 9.2* 9.0* 8.9*  ?HCT 32.8*  --  27.1* 27.4* 27.3* 27.8* 26.9*  ?MCV 95.1  --  93.4  91.0 91.0 92.1 91.2  ?PLT 317  --  297 294 296 331 329  ? ?Basic Metabolic Panel: ?Recent Labs  ?Lab 07/11/21 ?2133 07/12/21 ?4008 07/13/21 ?6761 07/14/21 ?0410 07/15/21 ?0234 07/16/21 ?0407 07/17/21 ?9509  ?NA  --  137 137 138 137 137 133*  ?K  --  4.1 4.0 5.1 4.6 4.5 4.4  ?CL  --  108 108 105 103 101 95*  ?CO2  --  22 21* '25 27 29 30  '$ ?GLUCOSE  --  87 81 72 73 79 77  ?BUN  --  36* 35* 34* 37* 42* 50*  ?CREATININE  --  1.96* 2.01* 1.95* 2.02* 2.06* 2.00*  ?CALCIUM  --  8.0* 8.3* 8.5* 8.8* 8.8* 8.9  ?MG 1.9 1.7  --   --   --   --   --   ?PHOS 3.1 3.0  --   --   --   --   --   ? ?GFR: ?Estimated Creatinine Clearance: 24.7 mL/min (A) (by C-G formula based on SCr of 2 mg/dL (H)). ?Liver Function Tests: ?Recent Labs  ?Lab 07/11/21 ?1827 07/11/21 ?2133 07/12/21 ?3267 07/13/21 ?0356  ?AST 39 34 30 27  ?ALT '18 18 15 14  '$ ?ALKPHOS 80 74 67 65  ?BILITOT 0.8 0.9 0.8 0.6  ?PROT 6.7 6.8 5.9* 6.1*  ?ALBUMIN 2.8* 2.7* 2.4* 2.6*  ? ?No results for input(s): LIPASE, AMYLASE in the last 168 hours. ?No results for input(s): AMMONIA in the last 168 hours. ?Coagulation Profile: ?No results for input(s): INR, PROTIME in the last 168 hours. ?Cardiac Enzymes: ?Recent Labs  ?Lab 07/11/21 ?2133  ?CKTOTAL 174  ? ?BNP (last 3 results) ?No results for input(s): PROBNP in the last 8760 hours. ?HbA1C: ?No results for input(s): HGBA1C in the last 72 hours. ?CBG: ?Recent Labs  ?Lab 07/16/21 ?1106 07/16/21 ?1623 07/16/21 ?2054 07/17/21 ?1245 07/17/21 ?1039  ?GLUCAP 121* 91 86 79 93  ? ?Lipid Profile: ?No results for input(s): CHOL, HDL, LDLCALC, TRIG, CHOLHDL, LDLDIRECT in the last 72 hours. ?Thyroid Function Tests: ?No results for input(s): TSH, T4TOTAL, FREET4, T3FREE, THYROIDAB in the last 72 hours. ? ?Anemia Panel: ?No results for input(s): VITAMINB12, FOLATE, FERRITIN, TIBC, IRON, RETICCTPCT in the last 72 hours. ? ?Urine analysis: ?   ?Component Value Date/Time  ? [place] YELLOW 04/17/2021 0117  ? APPEARANCEUR CLEAR 04/17/2021 0117  ?  LABSPEC >1.030 (H) 04/17/2021 0117  ? PHURINE 5.5 04/17/2021 0117  ? GLUCOSEU NEGATIVE 04/17/2021 0117  ? HGBUR SMALL (A) 04/17/2021 0117  ? BILIRUBINUR MODERATE (A) 04/17/2021 0117  ? [place] NEGATIVE 04/17/2021 0117  ? PROTEINUR >300 (A) 04/17/2021 0117  ? NITRITE NEGATIVE 04/17/2021 0117  ? LEUKOCYTESUR NEGATIVE 04/17/2021 0117  ? ?Sepsis Labs: ?'@LABRCNTIP'$ (procalcitonin:4,lacticidven:4) ? ?) ?Recent Results (from the past 240 hour(s))  ?Resp Panel by RT-PCR (Flu A&B, Covid) Nasopharyngeal Swab     Status: None  ? Collection Time: 07/11/21  5:40 PM  ? Specimen: Nasopharyngeal Swab; Nasopharyngeal(NP) swabs in vial transport medium  ?Result Value Ref Range Status  ? SARS Coronavirus 2 by  RT PCR NEGATIVE NEGATIVE Final  ?  Comment: (NOTE) ?SARS-CoV-2 target nucleic acids are NOT DETECTED. ? ?The SARS-CoV-2 RNA is generally detectable in upper respiratory ?specimens during the acute phase of infection. The lowest ?concentration of SARS-CoV-2 viral copies this assay can detect is ?138 copies/mL. A negative result does not preclude SARS-Cov-2 ?infection and should not be used as the sole basis for treatment or ?other patient management decisions. A negative result may occur with  ?improper specimen collection/handling, submission of specimen other ?than nasopharyngeal swab, presence of viral mutation(s) within the ?areas targeted by this assay, and inadequate number of viral ?copies(<138 copies/mL). A negative result must be combined with ?clinical observations, patient history, and epidemiological ?information. The expected result is Negative. ? ?Fact Sheet for Patients:  ?EntrepreneurPulse.com.au ? ?Fact Sheet for Healthcare Providers:  ?IncredibleEmployment.be ? ?This test is no t yet approved or cleared by the [place] FDA and  ?has been authorized for detection and/or diagnosis of SARS-CoV-2 by ?FDA under an Emergency Use Authorization (EUA). This EUA will remain  ?in  effect (meaning this test can be used) for the duration of the ?COVID-19 declaration under Section 564(b)(1) of the Act, 21 ?U.S.C.section 360bbb-3(b)(1), unless the authorization is terminated  ?or revoked sooner

## 2021-07-17 NOTE — Progress Notes (Addendum)
? ?Progress Note ? ?Patient Name: Kevin Schwartz ?Date of Encounter: 07/17/2021 ? ?[facility] Cardiologist: Glori Bickers, MD  ? ?Subjective  ? ?Reports 80% improved breathing. No chest pain. Able to lay flat. No oxygen requirement.  ? ?Inpatient Medications  ?  ?Scheduled Meds: ? allopurinol  100 mg Oral Daily  ? apixaban  2.5 mg Oral BID  ? aspirin EC  81 mg Oral Daily  ? citalopram  20 mg Oral Daily  ? dapagliflozin propanediol  10 mg Oral Daily  ? ezetimibe  10 mg Oral QHS  ? feeding supplement  237 mL Oral BID BM  ? furosemide  80 mg Intravenous BID  ? guaiFENesin  600 mg Oral BID  ? insulin aspart  0-9 Units Subcutaneous TID WC  ? levothyroxine  50 mcg Oral Q0600  ? multivitamin with minerals  1 tablet Oral Daily  ? sodium chloride flush  3 mL Intravenous Q12H  ? tamsulosin  0.4 mg Oral Daily  ? ?Continuous Infusions: ? sodium chloride    ? ?PRN Meds: ?sodium chloride, acetaminophen **OR** acetaminophen, albuterol, HYDROcodone-acetaminophen, sodium chloride flush  ? ?Vital Signs  ?  ?Vitals:  ? 07/16/21 2100 07/17/21 0503 07/17/21 0742 07/17/21 0800  ?BP: (!) 112/59   116/66  ?Pulse: 86  97 96  ?Resp: '12  19 15  '$ ?Temp: 97.8 ?F (36.6 ?C)     ?TempSrc: Oral     ?SpO2: 96%  100% 98%  ?Weight:  75.2 kg    ?Height:      ? ? ?Intake/Output Summary (Last 24 hours) at 07/17/2021 0943 ?Last data filed at 07/17/2021 0800 ?Gross per 24 hour  ?Intake 597 ml  ?Output 2550 ml  ?Net -1953 ml  ? ? ?  07/17/2021  ?  5:03 AM 07/16/2021  ?  3:47 AM 07/15/2021  ?  5:04 AM  ?Last 3 Weights  ?Weight (lbs) 165 lb 12.6 oz 163 lb 12.8 oz 172 lb 9.9 oz  ?Weight (kg) 75.2 kg 74.3 kg 78.3 kg  ?   ? ?Telemetry  ?  ?Sinus rhythm/tachycardia, intermittent bradycardia - Personally Reviewed ? ?ECG  ?N/A ? ?Physical Exam  ? ?GEN: No acute distress.   ?Neck: + JVD ?Cardiac: RRR, + murmurs, rubs, or gallops.  ?Respiratory: Clear to auscultation bilaterally. ?GI: Soft, nontender, non-distended  ?MS: No edema; No deformity. ?Neuro:  Nonfocal   ?Psych: Normal affect  ? ?Labs  ?  ?High Sensitivity Troponin:   ?Recent Labs  ?Lab 07/11/21 ?1827 07/11/21 ?2133  ?TROPONINIHS 54* 52*  ?   ?Chemistry ?Recent Labs  ?Lab 07/11/21 ?2133 07/12/21 ?4098 07/13/21 ?1191 07/14/21 ?0410 07/15/21 ?0234 07/16/21 ?0407 07/17/21 ?4782  ?NA  --  137 137   < > 137 137 133*  ?K  --  4.1 4.0   < > 4.6 4.5 4.4  ?CL  --  108 108   < > 103 101 95*  ?CO2  --  22 21*   < > '27 29 30  '$ ?GLUCOSE  --  87 81   < > 73 79 77  ?BUN  --  36* 35*   < > 37* 42* 50*  ?CREATININE  --  1.96* 2.01*   < > 2.02* 2.06* 2.00*  ?CALCIUM  --  8.0* 8.3*   < > 8.8* 8.8* 8.9  ?MG 1.9 1.7  --   --   --   --   --   ?PROT 6.8 5.9* 6.1*  --   --   --   --   ?  ALBUMIN 2.7* 2.4* 2.6*  --   --   --   --   ?AST 34 30 27  --   --   --   --   ?ALT '18 15 14  '$ --   --   --   --   ?ALKPHOS 74 67 65  --   --   --   --   ?BILITOT 0.9 0.8 0.6  --   --   --   --   ?GFRNONAA  --  32* 31*   < > 31* 30* 32*  ?ANIONGAP  --  7 8   < > '7 7 8  '$ ? < > = values in this interval not displayed.  ?  ?Lipids No results for input(s): CHOL, TRIG, HDL, LABVLDL, LDLCALC, CHOLHDL in the last 168 hours.  ?Hematology ?Recent Labs  ?Lab 07/14/21 ?0410 07/15/21 ?0234 07/16/21 ?0407  ?WBC 5.3 5.8 7.1  ?RBC 3.00* 3.02* 2.95*  ?HGB 9.2* 9.0* 8.9*  ?HCT 27.3* 27.8* 26.9*  ?MCV 91.0 92.1 91.2  ?MCH 30.7 29.8 30.2  ?MCHC 33.7 32.4 33.1  ?RDW 18.5* 17.9* 18.0*  ?PLT 296 331 329  ? ?Thyroid  ?Recent Labs  ?Lab 07/11/21 ?2313 07/12/21 ?2482  ?TSH 36.181* 35.246*  ?FREET4 0.88  --   ?  ?BNP ?Recent Labs  ?Lab 07/11/21 ?1828 07/17/21 ?0354  ?BNP 1,590.3* 1,079.2*  ?  ? ?Radiology  ?  ?No results found. ? ?Cardiac Studies  ? ?Echo 07/12/2021 ?1. Left ventricular ejection fraction, by estimation, is 45 to 50%. The  ?left ventricle has mildly decreased function. The left ventricle  ?demonstrates global hypokinesis. There is mild left ventricular  ?hypertrophy. Left ventricular diastolic function  ?could not be evaluated.  ? 2. Right ventricular systolic function  is mildly reduced. The right  ?ventricular size is normal. There is severely elevated pulmonary artery  ?systolic pressure. The estimated right ventricular systolic pressure is  ?50.0 mmHg.  ? 3. Left atrial size was moderately dilated.  ? 4. Right atrial size was mildly dilated.  ? 5. The mitral valve is abnormal. Moderate to severe mitral valve  ?regurgitation. No evidence of mitral stenosis.  ? 6. Tricuspid valve regurgitation is severe.  ? 7. The aortic valve is tricuspid. There is moderate calcification of the  ?aortic valve. There is moderate thickening of the aortic valve. Aortic  ?valve regurgitation is mild to moderate. Aortic valve  ?sclerosis/calcification is present, without any  ?evidence of aortic stenosis.  ? 8. The inferior vena cava is dilated in size with <50% respiratory  ?variability, suggesting right atrial pressure of 15 mmHg.  ? ?Comparison(s): Changes from prior study are noted. TR now severe, PA  ?pressures higher than prior of 52 mmHg.  ? ?Patient Profile  ?   ?88 y.o. male  with a hx of CAD, chronic systolic heart failure, ischemic cardiomyopathy, Mobitz 1 second-degree heart block, DM 2, hypertension, hyperlipidemia, paroxysmal atrial flutter, RAS s/p stenting of left renal artery in 2008, MR, CKD 3, PAD, OSA on CPAP, and secondary hyperparathyroidism due to CKD presented with acute hypoxic respiratory failure. Seen for heart failure exacerbation.  ? ?Assessment & Plan  ?  ?Acute on chronic combined CHF ?pHTN ?Moderate to severe MR ?Severe TR ?- Treated with IV lasix '80mg'$  BID with excellent diuresis and improved volume status ?- Net I & O negative 14.3 L with -1.9L in past 24 hours. ?- Weight down 182>>165lb today. Seems this is his dry weight.  ?- He  already got IV lasix '80mg'$  this morning. Will switch to Torsemide '40mg'$  daily with first dose later this evening.  ?- Continue Farxiga ?- No BB given hx of bradycardia ?- No ACE/ARB due to elevated renal function (could challenge in outpatient  setting) ? ?5. Acute on CKD IIIb ?- Scr stable at 2 (was 1.6 few months ago) ? ?6. CAD ?- No angina.  ?- Continue ASA and Zetia ?- 08/01/2020: VLDL 10 ?01/17/2021: Cholesterol, Total 94; HDL 44; LDL Chol Calc (NIH) 36; Triglycerides 60  ? ?7. Paroxysmal atrial flutter ?8. Mobitz 1 with intermittent bradycardia ?- Maintaining sinus rhythm with mobitz 1. Intermittent bradycardia.  ?- Currently HR stable in 90-100s.  ?- No plan to add  BB (previously seen by EP) ?- Continue Eliquis for anticoagulation  ? ?Has follow up with heart failure clinic 4/19. ? ?For questions or updates, please contact [place] ?Please consult [email or web address] for contact info under  ? ?  ?Signed, ?Leanor Kail, PA  ?07/17/2021, 9:43 AM    ? ?Personally seen and examined. Agree with APP above with the following comments: ?Patient notes that this is the breathing has done in sometime ?Exam notable for no JVD, less distended abdomen with no fluid wave, systolic heart murmur ?Labs notable for BNP still elevated 1000 but improved from 1500 ?Would recommend  ?- high dose of discharge diuretic: he will DC on torsemide 40 mg PO daily ?- will plan to resume aldactone 07/18/21 (ordered) if he is planned for DC tomorrow ?- rest as above ?

## 2021-07-17 NOTE — Progress Notes (Signed)
Occupational Therapy Treatment ?Patient Details ?Name: Kevin Schwartz ?MRN: [id number] ?DOB: [birth year] ?Today's Date: 07/17/2021 ? ? ?History of present illness 88 yo M adm 07/11/21 from SNF [facility]) due to respiratory distress with pleural effusion and L foot wound. PMHx:CHF, CAD, CKD, HTN, DM2, and BPH. ?  ?OT comments ? Patient with fair progress toward patient focused goals.  OT continues to recommend SNF for post acute rehab, as he does not have the 24 hour up to Max A needed to return home.  OT will continue efforts int he acute setting.    ? ?Recommendations for follow up therapy are one component of a multi-disciplinary discharge planning process, led by the attending physician.  Recommendations may be updated based on patient status, additional functional criteria and insurance authorization. ?   ?Follow Up Recommendations ? Skilled nursing-short term rehab (<3 hours/day)  ?  ?Assistance Recommended at Discharge Frequent or constant Supervision/Assistance  ?Patient can return home with the following ? Two people to help with walking and/or transfers;A lot of help with bathing/dressing/bathroom;Direct supervision/assist for medications management;Assist for transportation ?  ?Equipment Recommendations ? None recommended by OT  ?  ?Recommendations for Other Services   ? ?  ?Precautions / Restrictions Precautions ?Precautions: Fall ?Restrictions ?Weight Bearing Restrictions: No  ? ? ?  ? ?Mobility Bed Mobility ?Overal bed mobility: Needs Assistance ?Bed Mobility: Supine to Sit ?  ?  ?Supine to sit: Min assist, HOB elevated, Mod assist ?  ?  ?General bed mobility comments: increased assist to slide hips to the edge ?  ? ?Transfers ?Overall transfer level: Needs assistance ?Equipment used: Rolling walker (2 wheels) ?Transfers: Sit to/from Stand, Bed to chair/wheelchair/BSC ?Sit to Stand: Min assist ?  ?  ?Step pivot transfers: Min assist ?  ?  ?  ?  ?  ?Balance Overall balance assessment: Needs  assistance ?Sitting-balance support: Feet supported ?Sitting balance-[name]: Good ?  ?  ?Standing balance support: Bilateral upper extremity supported ?Standing balance-[name]: Poor ?  ?  ?  ?  ?  ?  ?  ?  ?  ?  ?  ?  ?   ? ?ADL either performed or assessed with clinical judgement  ? ?ADL   ?  ?  ?Grooming: Oral care;Set up;Sitting ?  ?  ?  ?  ?  ?Upper Body Dressing : Minimal assistance;Sitting ?  ?Lower Body Dressing: Sit to/from stand;Maximal assistance ?  ?Toilet Transfer: Minimal assistance;Stand-pivot;Rolling walker (2 wheels);BSC/3in1 ?  ?  ?  ?  ?  ?  ?  ?  ? ?Extremity/Trunk Assessment Upper Extremity Assessment ?Upper Extremity Assessment: Generalized weakness ?  ?Lower Extremity Assessment ?Lower Extremity Assessment: Defer to PT evaluation ?  ?Cervical / Trunk Assessment ?Cervical / Trunk Assessment: Kyphotic ?  ? ?Vision   ?  ?  ?Perception Perception ?Perception: Not tested ?  ?Praxis Praxis ?Praxis: Not tested ?  ? ?Cognition Arousal/Alertness: Awake/alert ?Behavior During Therapy: Flat affect ?Overall Cognitive Status: Within Functional Limits for tasks assessed ?  ?  ?  ?  ?  ?  ?  ?  ?  ?  ?  ?  ?  ?  ?  ?  ?  ?  ?  ?   ?   ? ?  ?   ? ? ?  ?General Comments  VSS on RA  ? ? ?Pertinent Vitals/ Pain       Pain Assessment ?Pain Assessment: Faces ?Faces Pain Scale: Hurts little  more ?Pain Location: left heel ?Pain Descriptors / Indicators: Aching, Guarding ?Pain Intervention(s): Monitored during session ? ?   ?  ?  ?  ?  ?  ?  ?  ?  ?  ?  ?  ?  ?  ?  ?  ?  ?  ?  ? ?  ?    ?  ?  ?  ?   ? ?Frequency ? Min 2X/week  ? ? ? ? ?  ?Progress Toward Goals ? ?OT Goals(current goals can now be found in the care plan section) ? Progress towards OT goals: Progressing toward goals ? ?Acute Rehab OT Goals ?OT Goal Formulation: With patient ?Time For Goal Achievement: 07/27/21 ?Potential to Achieve Goals: Good  ?Plan Discharge plan remains appropriate   ? ?Co-evaluation ? ? ?   ?  ?  ?  ?  ? ?  ?AM-PAC OT "6  Clicks" Daily Activity     ?Outcome Measure ? ? Help from another person eating meals?: None ?Help from another person taking care of personal grooming?: None ?Help from another person toileting, which includes using toliet, bedpan, or urinal?: A Lot ?Help from another person bathing (including washing, rinsing, drying)?: A Lot ?Help from another person to put on and taking off regular upper body clothing?: A Little ?Help from another person to put on and taking off regular lower body clothing?: A Lot ?6 Click Score: 17 ? ?  ?End of Session Equipment Utilized During Treatment: Rolling walker (2 wheels) ? ?OT Visit Diagnosis: Unsteadiness on feet (R26.81);Muscle weakness (generalized) (M62.81);Pain ?Pain - Right/Left: Left ?Pain - part of body: Ankle and joints of foot ?  ?Activity Tolerance Patient tolerated treatment well ?  ?Patient Left in chair;with call bell/phone within reach;with nursing/sitter in room ?  ?Nurse Communication Mobility status ?  ? ?   ? ?Time: 1141-1200 ?OT Time Calculation (min): 19 min ? ?Charges: OT General Charges ?$OT Visit: 1 Visit ?OT Treatments ?$Self Care/Home Management : 8-22 mins ? ?07/17/2021 ? ?RP, OTR/L ? ?Acute Rehabilitation Services ? ?Office:  [phone] ? ? ?Kevin Schwartz D [name] ?07/17/2021, 12:18 PM ?

## 2021-07-18 DIAGNOSIS — I5043 Acute on chronic combined systolic (congestive) and diastolic (congestive) heart failure: Secondary | ICD-10-CM | POA: Diagnosis not present

## 2021-07-18 LAB — BASIC METABOLIC PANEL
Anion gap: 6 (ref 5–15)
BUN: 57 mg/dL — ABNORMAL HIGH (ref 8–23)
CO2: 30 mmol/L (ref 22–32)
Calcium: 8.7 mg/dL — ABNORMAL LOW (ref 8.9–10.3)
Chloride: 97 mmol/L — ABNORMAL LOW (ref 98–111)
Creatinine, Ser: 2.31 mg/dL — ABNORMAL HIGH (ref 0.61–1.24)
GFR, Estimated: 27 mL/min — ABNORMAL LOW (ref >60)
Glucose, Bld: 71 mg/dL (ref 70–99)
Potassium: 4.7 mmol/L (ref 3.5–5.1)
Sodium: 133 mmol/L — ABNORMAL LOW (ref 135–145)

## 2021-07-18 LAB — CBC
HCT: 26 % — ABNORMAL LOW (ref 39.0–52.0)
Hemoglobin: 8.6 g/dL — ABNORMAL LOW (ref 13.0–17.0)
MCH: 30.1 pg (ref 26.0–34.0)
MCHC: 33.1 g/dL (ref 30.0–36.0)
MCV: 90.9 fL (ref 80.0–100.0)
Platelets: 344 10*3/uL (ref 150–400)
RBC: 2.86 MIL/uL — ABNORMAL LOW (ref 4.22–5.81)
RDW: 18.3 % — ABNORMAL HIGH (ref 11.5–15.5)
WBC: 6.3 10*3/uL (ref 4.0–10.5)
nRBC: 0 % (ref 0.0–0.2)

## 2021-07-18 LAB — GLUCOSE, CAPILLARY
Glucose-Capillary: 82 mg/dL (ref 70–99)
Glucose-Capillary: 133 mg/dL — ABNORMAL HIGH (ref 70–99)
Glucose-Capillary: 109 mg/dL — ABNORMAL HIGH (ref 70–99)
Glucose-Capillary: 138 mg/dL — ABNORMAL HIGH (ref 70–99)

## 2021-07-18 MED ORDER — TORSEMIDE 20 MG PO TABS
20.0000 mg | ORAL_TABLET | Freq: Every day | ORAL | Status: DC
  Administered 2021-07-18 – 2021-07-19 (×2): 20 mg via ORAL
  Filled 2021-07-18 (×2): qty 1

## 2021-07-18 NOTE — Progress Notes (Signed)
Nutrition Follow-up ? ?DOCUMENTATION CODES:  ? ?Not applicable ? ?INTERVENTION:  ? ?- Continue Ensure Enlive po BID, each supplement provides 350 kcal and 20 grams of protein ? ?- Continue MVI with minerals daily ? ?- Continue Regular diet order ? ?NUTRITION DIAGNOSIS:  ? ?Increased nutrient needs related to chronic illness (CHF) as evidenced by estimated needs. ? ?Ongoing, being addressed via oral nutrition supplements ? ?GOAL:  ? ?Patient will meet greater than or equal to 90% of their needs ? ?Progressing ? ?MONITOR:  ? ?PO intake, Supplement acceptance, Labs, Skin ? ?REASON FOR ASSESSMENT:  ? ?Consult ?Assessment of nutrition requirement/status ? ?ASSESSMENT:  ? ?88 y.o. male presented to the ED with shortness of breath. PMH includes CHF, CKD IIIa, HTN, T2DM, MGUS, and NSTEMI. Pt admitted with CHF exacerbation. ? ?Noted plan for pt to d/c back to SNF tomorrow. ? ?Pt unavailable at time of RD visit. Meal completions have been good, averaging 81% over the last 8 documented meals. Pt accepting most Ensure supplements per [facility] documentation. Will continue with current oral nutrition supplements and MVI. ? ?Admit weight: 82.6 kg ?Current weight: 72.5 kg ? ?Pt is net negative 14.9 L since admission. Suspect weight loss is related to negative fluid balance. ? ?Meal Completion: 15-100% x last 8 documented meals (averaging 81%) ? ?Medications reviewed and include: farxiga, Ensure Enlive BID, SSI, MVI with minerals, torsemide ? ?Labs reviewed: sodium 133, BUN 57, creatinine 2.31, hemoglobin 8.6 ?CBG's: 82-143 x 24 hours ? ?UOP: 1525 ml x 24 hours ?I/O's: -14.9 L since admit ? ?Diet Order:   ?Diet Order   ? ?       ?  Diet regular Room service appropriate? Yes with Assist; Fluid consistency: Thin  Diet effective now       ?  ? ?  ?  ? ?  ? ? ?EDUCATION NEEDS:  ? ?No education needs have been identified at this time ? ?Skin:  Skin Assessment: ?Skin Integrity Issues: ?Other: non-pressure wound L heel ? ?Last BM:  07/17/21  medium type 5 ? ?Height:  ? ?Ht Readings from Last 1 Encounters:  ?07/12/21 '5\' 8"'$  (1.727 m)  ? ? ?Weight:  ? ?Wt Readings from Last 1 Encounters:  ?07/18/21 72.5 kg  ? ? ?Ideal Body Weight:  70 kg ? ?BMI:  Body mass index is 24.3 kg/m?. ? ?Estimated Nutritional Needs:  ? ?Kcal:  1900-2100 ? ?Protein:  95-110 grams ? ?Fluid:  >/= 1.9 L ? ? ? ?Gustavus Bryant, MS, RD, LDN ?Inpatient Clinical Dietitian ?Please see AMiON for contact information. ? ?

## 2021-07-18 NOTE — Care Management Important Message (Signed)
Important Message ? ?Patient Details  ?Name: Kevin Schwartz ?MRN: [id number] ?Date of Birth: [birth year] ? ? ?Medicare Important Message Given:  Yes ? ? ? ? ?[name] ?07/18/2021, 8:50 AM ?

## 2021-07-18 NOTE — Progress Notes (Signed)
Physical Therapy Treatment ?Patient Details ?Name: Kevin Schwartz ?MRN: [id number] ?DOB: 05-31-1930 ?Today's Date: 07/18/2021 ? ? ?History of Present Illness 88 yo M adm 07/11/21 from SNF [facility]) due to respiratory distress with pleural effusion and L foot wound. PMHx:CHF, CAD, CKD, HTN, DM2, and BPH. ? ?  ?PT Comments  ? ? The pt was agreeable to session with focus on LE strengthening, but declined all offers of OOB mobility stating he is concerned about L heel wound. Attempted to educate on wt bearing through toes only with RW or NWB but pt declined offers, agreeable to sitting EOB with exercises only. Will continue to benefit from LE strengthening and progressive OOB mobility to challenge activity tolerance and capacity for transfers. Continue to recommend return to SNF when medically appropriate.  ?   ?Recommendations for follow up therapy are one component of a multi-disciplinary discharge planning process, led by the attending physician.  Recommendations may be updated based on patient status, additional functional criteria and insurance authorization. ? ?Follow Up Recommendations ? Skilled nursing-short term rehab (<3 hours/day) ?  ?  ?Assistance Recommended at Discharge Frequent or constant Supervision/Assistance  ?Patient can return home with the following Assist for transportation;Help with stairs or ramp for entrance;Assistance with cooking/housework;A lot of help with walking and/or transfers;A lot of help with bathing/dressing/bathroom ?  ?Equipment Recommendations ? Wheelchair (measurements PT);Wheelchair cushion (measurements PT)  ?  ?Recommendations for Other Services   ? ? ?  ?Precautions / Restrictions Precautions ?Precautions: Fall ?Precaution Comments: watch SpO2 ?Restrictions ?Weight Bearing Restrictions: No  ?  ? ?Mobility ? Bed Mobility ?Overal bed mobility: Needs Assistance ?Bed Mobility: Supine to Sit, Sit to Supine ?  ?  ?Supine to sit: Min assist, HOB elevated ?Sit to supine: Mod  assist ?  ?General bed mobility comments: minA to elevate trunk, modA to return to supine needing assist at BLE and trunk to reposition. +2 to boost in bed ?  ? ?Transfers ?  ?  ?  ?  ?  ?  ?  ?  ?  ?General transfer comment: pt declined all offers. reports he is worried he would open L heel wound ?  ? ? ? ?  ?Balance Overall balance assessment: Needs assistance ?Sitting-balance support: Feet supported ?Sitting balance-[name]: Good ?Sitting balance - Comments: EOB without support, posterior LOB with LE exercise ?Postural control: Posterior lean ?  ?  ?  ?  ?  ?  ?  ?  ?  ?  ?  ?  ?  ?  ?  ? ?  ?Cognition Arousal/Alertness: Awake/alert ?Behavior During Therapy: Flat affect ?Overall Cognitive Status: Within Functional Limits for tasks assessed ?  ?  ?  ?  ?  ?  ?  ?  ?  ?  ?  ?  ?  ?  ?  ?  ?General Comments: pt declining mobility despite attempts to educate ?  ?  ? ?  ?Exercises General Exercises - Upper Extremity ?Shoulder Flexion: Strengthening, Both, 10 reps (2 x 10 holding small soda can) ?Elbow Flexion: Strengthening, Both, 10 reps (2 x 10 holding small soda can) ?General Exercises - Lower Extremity ?Long Arc Quad: Strengthening, Both, 10 reps, Seated (2 x 10 against min resistance) ?Heel Slides: Strengthening, Both, 10 reps, Seated (2 x 10 against min resistance) ?Hip Flexion/Marching: AROM, Both, 10 reps (2 x 10) ? ?  ?General Comments General comments (skin integrity, edema, etc.): VSS on RA ?  ?  ? ?Pertinent Vitals/Pain  Pain Assessment ?Pain Assessment: Faces ?Faces Pain Scale: Hurts little more ?Pain Location: left heel ?Pain Descriptors / Indicators: Aching, Guarding ?Pain Intervention(s): Limited activity within patient's tolerance, Monitored during session, Repositioned  ? ? ? ?PT Goals (current goals can now be found in the care plan section) Acute Rehab PT Goals ?Patient Stated Goal: to return to SNF ?PT Goal Formulation: With patient ?Time For Goal Achievement: 07/26/21 ?Potential to Achieve  Goals: Good ?Progress towards PT goals: Progressing toward goals ? ?  ?Frequency ? ? ? Min 2X/week ? ? ? ?  ?PT Plan Current plan remains appropriate  ? ? ?   ?AM-PAC PT "6 Clicks" Mobility   ?Outcome Measure ? Help needed turning from your back to your side while in a flat bed without using bedrails?: A Little ?Help needed moving from lying on your back to sitting on the side of a flat bed without using bedrails?: A Little ?Help needed moving to and from a bed to a chair (including a wheelchair)?: A Little ?Help needed standing up from a chair using your arms (e.g., wheelchair or bedside chair)?: A Lot ?Help needed to walk in hospital room?: Total ?Help needed climbing 3-5 steps with a railing? : Total ?6 Click Score: 13 ? ?  ?End of Session   ?Activity Tolerance: Patient tolerated treatment well;Other (comment) (refused OOB mobility due to concern for L heel wound) ?Patient left: in bed;with call bell/phone within reach;with bed alarm set (chair position) ?Nurse Communication: Mobility status ?PT Visit Diagnosis: Unsteadiness on feet (R26.81);Other abnormalities of gait and mobility (R26.89);Pain ?Pain - Right/Left: Left ?  ? ? ?Time: 2707-8675 ?PT Time Calculation (min) (ACUTE ONLY): 20 min ? ?Charges:  $Therapeutic Exercise: 8-22 mins          ?          ? ?Kevin Schwartz, PT, DPT  ? ?Acute Rehabilitation Department ?Pager #: [phone] - [phone] ? ? ?Kevin Schwartz ?07/18/2021, 2:25 PM ? ?

## 2021-07-18 NOTE — Plan of Care (Signed)
?  Problem: Education: ?Goal: Ability to verbalize understanding of medication therapies will improve ?Outcome: Progressing ?  ?Problem: Activity: ?Goal: Capacity to carry out activities will improve ?Outcome: Not Applicable ?  ?Problem: Cardiac: ?Goal: Ability to achieve and maintain adequate cardiopulmonary perfusion will improve ?Outcome: Progressing ?  ?Problem: Education: ?Goal: Knowledge of General Education information will improve ?Description: Including pain rating scale, medication(s)/side effects and non-pharmacologic comfort measures ?Outcome: Progressing ?  ?

## 2021-07-18 NOTE — Progress Notes (Signed)
? ?Progress Note ? ?Patient Name: Kevin Schwartz ?Date of Encounter: 07/18/2021 ? ?[facility] Cardiologist: Glori Bickers, MD  ? ?Subjective  ? ?Slight increase in creatinine overnight. ?Patient notes no symptoms.  No chest pain, SOB, PND.  Hasn't been up today.  ? ?Inpatient Medications  ?  ?Scheduled Meds: ? allopurinol  100 mg Oral Daily  ? apixaban  2.5 mg Oral BID  ? aspirin EC  81 mg Oral Daily  ? citalopram  20 mg Oral Daily  ? dapagliflozin propanediol  10 mg Oral Daily  ? ezetimibe  10 mg Oral QHS  ? feeding supplement  237 mL Oral BID BM  ? guaiFENesin  600 mg Oral BID  ? insulin aspart  0-9 Units Subcutaneous TID WC  ? levothyroxine  50 mcg Oral Q0600  ? multivitamin with minerals  1 tablet Oral Daily  ? sodium chloride flush  3 mL Intravenous Q12H  ? spironolactone  12.5 mg Oral Daily  ? tamsulosin  0.4 mg Oral Daily  ? torsemide  40 mg Oral Daily  ? ?Continuous Infusions: ? sodium chloride    ? ?PRN Meds: ?sodium chloride, acetaminophen **OR** acetaminophen, albuterol, HYDROcodone-acetaminophen, HYDROmorphone (DILAUDID) injection, sodium chloride flush  ? ?Vital Signs  ?  ?Vitals:  ? 07/17/21 1939 07/17/21 2133 07/17/21 2200 07/18/21 0447  ?BP: 101/61   (!) 92/56  ?Pulse: 95  100 88  ?Resp: '17 19 17 14  '$ ?Temp: 97.7 ?F (36.5 ?C)   98.1 ?F (36.7 ?C)  ?TempSrc: Oral   Oral  ?SpO2: 99% 99% 97% 99%  ?Weight:    72.5 kg  ?Height:      ? ? ?Intake/Output Summary (Last 24 hours) at 07/18/2021 0835 ?Last data filed at 07/18/2021 0716 ?Gross per 24 hour  ?Intake 1070 ml  ?Output 1275 ml  ?Net -205 ml  ? ? ?  07/18/2021  ?  4:47 AM 07/17/2021  ?  5:03 AM 07/16/2021  ?  3:47 AM  ?Last 3 Weights  ?Weight (lbs) 159 lb 13.3 oz 165 lb 12.6 oz 163 lb 12.8 oz  ?Weight (kg) 72.5 kg 75.2 kg 74.3 kg  ?   ? ?Telemetry  ?  ?SR with 1st HB and Mobitz Type I HB - Personally Reviewed ? ?Physical Exam  ? ?Gen: no distress, Elderly male   ?Neck: No JVD even with Wenckebach cycles ?Cardiac: No Rubs or Gallops, soft systolic  and diastolic murmurs, RRR +2 radial pulses ?Respiratory: Clear to auscultation bilaterally, normal effort, normal  respiratory rate ?GI: Soft, nontenderly distended but much improved ?MS: No  edema;  moves all extremities ?Neuro:  At time of evaluation, alert and oriented to person/place/time/situation  ?Psych: Normal affect, patient feels well ? ? ?Labs  ?  ?High Sensitivity Troponin:   ?Recent Labs  ?Lab 07/11/21 ?1827 07/11/21 ?2133  ?TROPONINIHS 54* 52*  ?   ?Chemistry ?Recent Labs  ?Lab 07/11/21 ?2133 07/12/21 ?2536 07/13/21 ?6440 07/14/21 ?0410 07/16/21 ?0407 07/17/21 ?3474 07/18/21 ?2595  ?NA  --  137 137   < > 137 133* 133*  ?K  --  4.1 4.0   < > 4.5 4.4 4.7  ?CL  --  108 108   < > 101 95* 97*  ?CO2  --  22 21*   < > '29 30 30  '$ ?GLUCOSE  --  87 81   < > 79 77 71  ?BUN  --  36* 35*   < > 42* 50* 57*  ?CREATININE  --  1.96* 2.01*   < > 2.06* 2.00* 2.31*  ?CALCIUM  --  8.0* 8.3*   < > 8.8* 8.9 8.7*  ?MG 1.9 1.7  --   --   --   --   --   ?PROT 6.8 5.9* 6.1*  --   --   --   --   ?ALBUMIN 2.7* 2.4* 2.6*  --   --   --   --   ?AST 34 30 27  --   --   --   --   ?ALT '18 15 14  '$ --   --   --   --   ?ALKPHOS 74 67 65  --   --   --   --   ?BILITOT 0.9 0.8 0.6  --   --   --   --   ?GFRNONAA  --  32* 31*   < > 30* 32* 27*  ?ANIONGAP  --  7 8   < > '7 8 6  '$ ? < > = values in this interval not displayed.  ?  ?Lipids No results for input(s): CHOL, TRIG, HDL, LABVLDL, LDLCALC, CHOLHDL in the last 168 hours.  ?Hematology ?Recent Labs  ?Lab 07/15/21 ?0234 07/16/21 ?0407 07/18/21 ?9211  ?WBC 5.8 7.1 6.3  ?RBC 3.02* 2.95* 2.86*  ?HGB 9.0* 8.9* 8.6*  ?HCT 27.8* 26.9* 26.0*  ?MCV 92.1 91.2 90.9  ?MCH 29.8 30.2 30.1  ?MCHC 32.4 33.1 33.1  ?RDW 17.9* 18.0* 18.3*  ?PLT 331 329 344  ? ?Thyroid  ?Recent Labs  ?Lab 07/11/21 ?2313 07/12/21 ?9417  ?TSH 36.181* 35.246*  ?FREET4 0.88  --   ?  ?BNP ?Recent Labs  ?Lab 07/11/21 ?1828 07/17/21 ?0354  ?BNP 1,590.3* 1,079.2*  ?  ? ?Radiology  ?  ?No results found. ? ?Cardiac Studies  ? ?Echo  07/12/2021 ?1. Left ventricular ejection fraction, by estimation, is 45 to 50%. The  ?left ventricle has mildly decreased function. The left ventricle  ?demonstrates global hypokinesis. There is mild left ventricular  ?hypertrophy. Left ventricular diastolic function  ?could not be evaluated.  ? 2. Right ventricular systolic function is mildly reduced. The right  ?ventricular size is normal. There is severely elevated pulmonary artery  ?systolic pressure. The estimated right ventricular systolic pressure is  ?40.8 mmHg.  ? 3. Left atrial size was moderately dilated.  ? 4. Right atrial size was mildly dilated.  ? 5. The mitral valve is abnormal. Moderate to severe mitral valve  ?regurgitation. No evidence of mitral stenosis.  ? 6. Tricuspid valve regurgitation is severe.  ? 7. The aortic valve is tricuspid. There is moderate calcification of the  ?aortic valve. There is moderate thickening of the aortic valve. Aortic  ?valve regurgitation is mild to moderate. Aortic valve  ?sclerosis/calcification is present, without any  ?evidence of aortic stenosis.  ? 8. The inferior vena cava is dilated in size with <50% respiratory  ?variability, suggesting right atrial pressure of 15 mmHg.  ? ?Comparison(s): Changes from prior study are noted. TR now severe, PA  ?pressures higher than prior of 52 mmHg.  ? ?Patient Profile  ?   ?88 y.o. male  with a hx of CAD, chronic systolic heart failure, ischemic cardiomyopathy, Mobitz 1 second-degree heart block, DM 2, hypertension, hyperlipidemia, paroxysmal atrial flutter, RAS s/p stenting of left renal artery in 2008, MR, CKD 3, PAD, OSA on CPAP, and secondary hyperparathyroidism due to CKD presented with acute hypoxic respiratory failure. Seen for heart failure exacerbation.  ? ?  Assessment & Plan  ?  ?Heart Failure Mildly Reduced Ejection Fraction  ?Group II Pulmonary hypertension ?- NYHA class I, Stage C, euvolemic, etiology unclear  ?Moderate to Severe Central MR ?Severe TR ?Mild to  moderate AI ?CKD Stage IIIb ?- Creatinine stable with diuresis ?- Transitioned to just torsemide 40 mg daily and Farxiga 10 mg PO daily; BNP 07/17/21 was 1000 improved from 1500- torsmide to 20 mg today ?- no plans to restart BB due to HB  ?-will not start MRA at this time; on 07/31/21 f/u may need MRA restarted based on how he recovers ?- ARNI has not been started secondary to blood pressure: there are no plans to start at this time ?- In the setting debility there were concerns if he is a TEER candidate  ?  ?AFL ?CAD with prior PCI ?- see prior notes: plans for DOAC and ASA by his primary cardiologist ?- zetia returned OK to resume home statin ?  ?Mobitz 1 HB with intermittent Bradycardia ?AFL ?- seeing EP as above ? ? ?[facility] will sign off.   ?Medication Recommendations:  Torsemide 20 mg  and Farixa no home Coreg; no home lasix, DC home plavix ?Other recommendations (labs, testing, etc):  follow up BMP ?Follow up as an outpatient:  has 07/31/21 appointment ? ? ? ?For questions or updates, please contact [place] ?Please consult [email or web address] for contact info under  ? ?  ?Signed, ?Werner Lean, MD  ?07/18/2021, 8:35 AM    ? ?

## 2021-07-18 NOTE — Progress Notes (Signed)
?PROGRESS NOTE ? ? ? ?Kevin Schwartz  LKG:[id number] DOB: Jul 14, 1930 DOA: 07/11/2021 ?PCP: [name], MD  ?Narrative 89/M with history of CAD, multiple PCI/stents, ischemic cardiomyopathy EF of 45%, severe PAH, mitral regurgitation, type 2 diabetes mellitus, hypertension, dyslipidemia, CKD 3a, history of renal artery stenosis status post stenting in 2008, paroxysmal atrial flutter on Eliquis, history of recurrent epistaxis was brought to the ED 3/30 with respiratory distress.  Chest x-ray noted basilar atelectasis, small pleural effusion, labs noted creatinine of 2.0, BNP 1590, troponin of 50 ?-Started on diuretics, diuresing, cardiology following ? ?Subjective: ?-Feels better overall breathing continues to improve ? ?Assessment & Plan: ? ?Acute on chronic systolic CHF/ICM ?Mod to severe MR ?Severe Pulm Hypertension ?-Last echo in 4/22 with EF of 45% ?-Diuresed with IV Lasix, he is 15 L negative ?-Cardiology following switch to oral torsemide yesterday ?-Continue carvedilol, [place]ade resumed ?-Mild bump in creatinine noted, torsemide dose decreased ?-GDMT limited by CKD  ?-Discharge to SNF tomorrow, discussed with patient regarding palliative care follow-up ? ?AKI on CKD 3a ?-Baseline creatinine around 1.6-2, slight bump in creatinine to 2.3 today ?-Diuretic dose decreased, BMP in a.m. ? ?History of CAD ?-had NSTEMI in 4/22, cath noted multivessel CAD- 85% proximal RCA, 85% mid RCA, 90% distal RCA, 80% mid LAD, 80% OM 2 lesion, on 08/02/2020 underwent staged PCI with DES to the mid LAD, balloon angioplasty of left circumflex lesion, the diffusely diseased RCA was treated medically. ?-Continue Plavix, Eliquis, statin ?-Troponins minimally elevated, no symptoms of ACS ?  ?Paroxysmal Atrial Flutter ?-Continue Eliquis, carvedilol ? ?Abnormal TSH ?-Significantly elevated at 35-repeat elevated as well, patient unable to provide any information about history of hypothyroidism or Synthroid use, T4 and T3 were  normal ?-Started on low-dose 50 mcg Synthroid ?-Recheck TSH in 6 weeks ?  ?History of recurrent epistaxis ?-Ongoing frequent issue, complicated by Plavix, Eliquis use ?-Recently had anterior nasal packing this was removed 2 days ago ? ?Iron Defi anemia ?-Continue IV iron, hemoglobin stable now ? ?T2DM ?-CBG stable, started on Farxiga ? ?H/o MGUS ?-fu with Heme ? ?L foot wound ?-Woc COnsult appreciated ? ?DVT prophylaxis:apixaban ?Code Status: DO NOT RESUSCITATE ?Family Communication: Called and updated patient's son [name] yesterday ?Disposition Plan: Back to SNF tomorrow if stable ? ?Consultants:  ? ? ?Procedures:  ? ?Antimicrobials:  ? ? ?Objective: ?Vitals:  ? 07/17/21 2133 07/17/21 2200 07/18/21 0447 07/18/21 0929  ?BP:   (!) 92/56 106/67  ?Pulse:  100 88   ?Resp: '19 17 14   '$ ?Temp:   98.1 ?F (36.7 ?C)   ?TempSrc:   Oral   ?SpO2: 99% 97% 99% 100%  ?Weight:   72.5 kg   ?Height:      ? ? ?Intake/Output Summary (Last 24 hours) at 07/18/2021 1028 ?Last data filed at 07/18/2021 0854 ?Gross per 24 hour  ?Intake 1390 ml  ?Output 1275 ml  ?Net 115 ml  ? ?Filed Weights  ? 07/16/21 0347 07/17/21 0503 07/18/21 0447  ?Weight: 74.3 kg 75.2 kg 72.5 kg  ? ? ?Examination: ? ?General exam: Pleasant elderly male sitting up in bed, AAOx3, hard of hearing, no distress ?HEENT: No JVD ?CVS: S1-S2, regular rhythm ?Lungs: Clear bilaterally ?Abdomen: Soft, obese, nontender, bowel sounds present  ?Extremities: Trace edema, left foot wound with dressing  ?Psychiatry: Mood & affect appropriate.  ? ?Data Reviewed:  ? ?CBC: ?Recent Labs  ?Lab 07/11/21 ?1827 07/11/21 ?2313 07/12/21 ?6644 07/13/21 ?0347 07/14/21 ?0410 07/15/21 ?0234 07/16/21 ?0407 07/18/21 ?4259  ?WBC  6.3  --  4.7 4.8 5.3 5.8 7.1 6.3  ?NEUTROABS 3.6 3.7 3.0  --   --   --   --   --   ?HGB 10.4*  --  8.8* 9.2* 9.2* 9.0* 8.9* 8.6*  ?HCT 32.8*  --  27.1* 27.4* 27.3* 27.8* 26.9* 26.0*  ?MCV 95.1  --  93.4 91.0 91.0 92.1 91.2 90.9  ?PLT 317  --  297 294 296 331 329 344  ? ?Basic Metabolic  Panel: ?Recent Labs  ?Lab 07/11/21 ?2133 07/12/21 ?6644 07/13/21 ?0347 07/14/21 ?0410 07/15/21 ?0234 07/16/21 ?0407 07/17/21 ?4259 07/18/21 ?5638  ?NA  --  137   < > 138 137 137 133* 133*  ?K  --  4.1   < > 5.1 4.6 4.5 4.4 4.7  ?CL  --  108   < > 105 103 101 95* 97*  ?CO2  --  22   < > '25 27 29 30 30  '$ ?GLUCOSE  --  87   < > 72 73 79 77 71  ?BUN  --  36*   < > 34* 37* 42* 50* 57*  ?CREATININE  --  1.96*   < > 1.95* 2.02* 2.06* 2.00* 2.31*  ?CALCIUM  --  8.0*   < > 8.5* 8.8* 8.8* 8.9 8.7*  ?MG 1.9 1.7  --   --   --   --   --   --   ?PHOS 3.1 3.0  --   --   --   --   --   --   ? < > = values in this interval not displayed.  ? ?GFR: ?Estimated Creatinine Clearance: 21.4 mL/min (A) (by C-G formula based on SCr of 2.31 mg/dL (H)). ?Liver Function Tests: ?Recent Labs  ?Lab 07/11/21 ?1827 07/11/21 ?2133 07/12/21 ?7564 07/13/21 ?0356  ?AST 39 34 30 27  ?ALT '18 18 15 14  '$ ?ALKPHOS 80 74 67 65  ?BILITOT 0.8 0.9 0.8 0.6  ?PROT 6.7 6.8 5.9* 6.1*  ?ALBUMIN 2.8* 2.7* 2.4* 2.6*  ? ?No results for input(s): LIPASE, AMYLASE in the last 168 hours. ?No results for input(s): AMMONIA in the last 168 hours. ?Coagulation Profile: ?No results for input(s): INR, PROTIME in the last 168 hours. ?Cardiac Enzymes: ?Recent Labs  ?Lab 07/11/21 ?2133  ?CKTOTAL 174  ? ?BNP (last 3 results) ?No results for input(s): PROBNP in the last 8760 hours. ?HbA1C: ?No results for input(s): HGBA1C in the last 72 hours. ?CBG: ?Recent Labs  ?Lab 07/17/21 ?0620 07/17/21 ?1039 07/17/21 ?1612 07/17/21 ?2147 07/18/21 ?3329  ?GLUCAP 79 93 143* 101* 82  ? ?Lipid Profile: ?No results for input(s): CHOL, HDL, LDLCALC, TRIG, CHOLHDL, LDLDIRECT in the last 72 hours. ?Thyroid Function Tests: ?No results for input(s): TSH, T4TOTAL, FREET4, T3FREE, THYROIDAB in the last 72 hours. ? ?Anemia Panel: ?No results for input(s): VITAMINB12, FOLATE, FERRITIN, TIBC, IRON, RETICCTPCT in the last 72 hours. ? ?Urine analysis: ?   ?Component Value Date/Time  ? [place] YELLOW 04/17/2021  0117  ? APPEARANCEUR CLEAR 04/17/2021 0117  ? LABSPEC >1.030 (H) 04/17/2021 0117  ? PHURINE 5.5 04/17/2021 0117  ? GLUCOSEU NEGATIVE 04/17/2021 0117  ? HGBUR SMALL (A) 04/17/2021 0117  ? BILIRUBINUR MODERATE (A) 04/17/2021 0117  ? [place] NEGATIVE 04/17/2021 0117  ? PROTEINUR >300 (A) 04/17/2021 0117  ? NITRITE NEGATIVE 04/17/2021 0117  ? LEUKOCYTESUR NEGATIVE 04/17/2021 0117  ? ?Sepsis Labs: ?'@LABRCNTIP'$ (procalcitonin:4,lacticidven:4) ? ?) ?Recent Results (from the past 240 hour(s))  ?Resp Panel by RT-PCR (Flu A&B,  Covid) Nasopharyngeal Swab     Status: None  ? Collection Time: 07/11/21  5:40 PM  ? Specimen: Nasopharyngeal Swab; Nasopharyngeal(NP) swabs in vial transport medium  ?Result Value Ref Range Status  ? SARS Coronavirus 2 by RT PCR NEGATIVE NEGATIVE Final  ?  Comment: (NOTE) ?SARS-CoV-2 target nucleic acids are NOT DETECTED. ? ?The SARS-CoV-2 RNA is generally detectable in upper respiratory ?specimens during the acute phase of infection. The lowest ?concentration of SARS-CoV-2 viral copies this assay can detect is ?138 copies/mL. A negative result does not preclude SARS-Cov-2 ?infection and should not be used as the sole basis for treatment or ?other patient management decisions. A negative result may occur with  ?improper specimen collection/handling, submission of specimen other ?than nasopharyngeal swab, presence of viral mutation(s) within the ?areas targeted by this assay, and inadequate number of viral ?copies(<138 copies/mL). A negative result must be combined with ?clinical observations, patient history, and epidemiological ?information. The expected result is Negative. ? ?Fact Sheet for Patients:  ?EntrepreneurPulse.com.au ? ?Fact Sheet for Healthcare Providers:  ?IncredibleEmployment.be ? ?This test is no t yet approved or cleared by the [place] FDA and  ?has been authorized for detection and/or diagnosis of SARS-CoV-2 by ?FDA under an Emergency Use  Authorization (EUA). This EUA will remain  ?in effect (meaning this test can be used) for the duration of the ?COVID-19 declaration under Section 564(b)(1) of the Act, 21 ?U.S.C.section 360bbb-3(b)(1), unles

## 2021-07-19 DIAGNOSIS — E1122 Type 2 diabetes mellitus with diabetic chronic kidney disease: Secondary | ICD-10-CM | POA: Diagnosis not present

## 2021-07-19 DIAGNOSIS — I340 Nonrheumatic mitral (valve) insufficiency: Secondary | ICD-10-CM | POA: Diagnosis not present

## 2021-07-19 DIAGNOSIS — R7989 Other specified abnormal findings of blood chemistry: Secondary | ICD-10-CM | POA: Diagnosis not present

## 2021-07-19 DIAGNOSIS — E785 Hyperlipidemia, unspecified: Secondary | ICD-10-CM | POA: Diagnosis not present

## 2021-07-19 DIAGNOSIS — I10 Essential (primary) hypertension: Secondary | ICD-10-CM | POA: Diagnosis not present

## 2021-07-19 DIAGNOSIS — J984 Other disorders of lung: Secondary | ICD-10-CM | POA: Diagnosis not present

## 2021-07-19 DIAGNOSIS — S0003XA Contusion of scalp, initial encounter: Secondary | ICD-10-CM | POA: Diagnosis not present

## 2021-07-19 DIAGNOSIS — E1159 Type 2 diabetes mellitus with other circulatory complications: Secondary | ICD-10-CM | POA: Diagnosis not present

## 2021-07-19 DIAGNOSIS — W19XXXA Unspecified fall, initial encounter: Secondary | ICD-10-CM | POA: Diagnosis not present

## 2021-07-19 DIAGNOSIS — I38 Endocarditis, valve unspecified: Secondary | ICD-10-CM | POA: Diagnosis not present

## 2021-07-19 DIAGNOSIS — R110 Nausea: Secondary | ICD-10-CM | POA: Diagnosis not present

## 2021-07-19 DIAGNOSIS — R1312 Dysphagia, oropharyngeal phase: Secondary | ICD-10-CM | POA: Diagnosis not present

## 2021-07-19 DIAGNOSIS — I4892 Unspecified atrial flutter: Secondary | ICD-10-CM | POA: Diagnosis not present

## 2021-07-19 DIAGNOSIS — A419 Sepsis, unspecified organism: Secondary | ICD-10-CM | POA: Diagnosis not present

## 2021-07-19 DIAGNOSIS — Z955 Presence of coronary angioplasty implant and graft: Secondary | ICD-10-CM | POA: Diagnosis not present

## 2021-07-19 DIAGNOSIS — S199XXA Unspecified injury of neck, initial encounter: Secondary | ICD-10-CM | POA: Diagnosis not present

## 2021-07-19 DIAGNOSIS — R0602 Shortness of breath: Secondary | ICD-10-CM | POA: Diagnosis not present

## 2021-07-19 DIAGNOSIS — Z7401 Bed confinement status: Secondary | ICD-10-CM | POA: Diagnosis not present

## 2021-07-19 DIAGNOSIS — R609 Edema, unspecified: Secondary | ICD-10-CM | POA: Diagnosis not present

## 2021-07-19 DIAGNOSIS — S0990XA Unspecified injury of head, initial encounter: Secondary | ICD-10-CM | POA: Diagnosis not present

## 2021-07-19 DIAGNOSIS — I255 Ischemic cardiomyopathy: Secondary | ICD-10-CM | POA: Diagnosis not present

## 2021-07-19 DIAGNOSIS — R2689 Other abnormalities of gait and mobility: Secondary | ICD-10-CM | POA: Diagnosis not present

## 2021-07-19 DIAGNOSIS — R531 Weakness: Secondary | ICD-10-CM | POA: Diagnosis not present

## 2021-07-19 DIAGNOSIS — N183 Chronic kidney disease, stage 3 unspecified: Secondary | ICD-10-CM | POA: Diagnosis not present

## 2021-07-19 DIAGNOSIS — Z79899 Other long term (current) drug therapy: Secondary | ICD-10-CM | POA: Diagnosis not present

## 2021-07-19 DIAGNOSIS — I4891 Unspecified atrial fibrillation: Secondary | ICD-10-CM | POA: Diagnosis not present

## 2021-07-19 DIAGNOSIS — F29 Unspecified psychosis not due to a substance or known physiological condition: Secondary | ICD-10-CM | POA: Diagnosis not present

## 2021-07-19 DIAGNOSIS — W06XXXA Fall from bed, initial encounter: Secondary | ICD-10-CM | POA: Diagnosis not present

## 2021-07-19 DIAGNOSIS — E119 Type 2 diabetes mellitus without complications: Secondary | ICD-10-CM | POA: Diagnosis not present

## 2021-07-19 DIAGNOSIS — I5022 Chronic systolic (congestive) heart failure: Secondary | ICD-10-CM | POA: Diagnosis not present

## 2021-07-19 DIAGNOSIS — L988 Other specified disorders of the skin and subcutaneous tissue: Secondary | ICD-10-CM | POA: Diagnosis not present

## 2021-07-19 DIAGNOSIS — G9341 Metabolic encephalopathy: Secondary | ICD-10-CM | POA: Diagnosis not present

## 2021-07-19 DIAGNOSIS — Z7901 Long term (current) use of anticoagulants: Secondary | ICD-10-CM | POA: Diagnosis not present

## 2021-07-19 DIAGNOSIS — Z7982 Long term (current) use of aspirin: Secondary | ICD-10-CM | POA: Diagnosis not present

## 2021-07-19 DIAGNOSIS — I2510 Atherosclerotic heart disease of native coronary artery without angina pectoris: Secondary | ICD-10-CM | POA: Diagnosis not present

## 2021-07-19 DIAGNOSIS — Z8616 Personal history of COVID-19: Secondary | ICD-10-CM | POA: Diagnosis not present

## 2021-07-19 DIAGNOSIS — U099 Post covid-19 condition, unspecified: Secondary | ICD-10-CM | POA: Diagnosis not present

## 2021-07-19 DIAGNOSIS — M6281 Muscle weakness (generalized): Secondary | ICD-10-CM | POA: Diagnosis not present

## 2021-07-19 DIAGNOSIS — I083 Combined rheumatic disorders of mitral, aortic and tricuspid valves: Secondary | ICD-10-CM | POA: Diagnosis not present

## 2021-07-19 DIAGNOSIS — R042 Hemoptysis: Secondary | ICD-10-CM | POA: Diagnosis not present

## 2021-07-19 DIAGNOSIS — I739 Peripheral vascular disease, unspecified: Secondary | ICD-10-CM | POA: Diagnosis not present

## 2021-07-19 DIAGNOSIS — N1831 Chronic kidney disease, stage 3a: Secondary | ICD-10-CM | POA: Diagnosis not present

## 2021-07-19 DIAGNOSIS — I5042 Chronic combined systolic (congestive) and diastolic (congestive) heart failure: Secondary | ICD-10-CM | POA: Diagnosis not present

## 2021-07-19 DIAGNOSIS — R040 Epistaxis: Secondary | ICD-10-CM | POA: Diagnosis not present

## 2021-07-19 DIAGNOSIS — J9601 Acute respiratory failure with hypoxia: Secondary | ICD-10-CM | POA: Diagnosis not present

## 2021-07-19 DIAGNOSIS — I517 Cardiomegaly: Secondary | ICD-10-CM | POA: Diagnosis not present

## 2021-07-19 DIAGNOSIS — R519 Headache, unspecified: Secondary | ICD-10-CM | POA: Diagnosis not present

## 2021-07-19 DIAGNOSIS — Z9981 Dependence on supplemental oxygen: Secondary | ICD-10-CM | POA: Diagnosis not present

## 2021-07-19 DIAGNOSIS — I509 Heart failure, unspecified: Secondary | ICD-10-CM | POA: Diagnosis not present

## 2021-07-19 DIAGNOSIS — I130 Hypertensive heart and chronic kidney disease with heart failure and stage 1 through stage 4 chronic kidney disease, or unspecified chronic kidney disease: Secondary | ICD-10-CM | POA: Diagnosis not present

## 2021-07-19 DIAGNOSIS — I5032 Chronic diastolic (congestive) heart failure: Secondary | ICD-10-CM | POA: Diagnosis not present

## 2021-07-19 DIAGNOSIS — G9340 Encephalopathy, unspecified: Secondary | ICD-10-CM | POA: Diagnosis not present

## 2021-07-19 DIAGNOSIS — I441 Atrioventricular block, second degree: Secondary | ICD-10-CM | POA: Diagnosis not present

## 2021-07-19 DIAGNOSIS — I252 Old myocardial infarction: Secondary | ICD-10-CM | POA: Diagnosis not present

## 2021-07-19 DIAGNOSIS — R41841 Cognitive communication deficit: Secondary | ICD-10-CM | POA: Diagnosis not present

## 2021-07-19 DIAGNOSIS — R262 Difficulty in walking, not elsewhere classified: Secondary | ICD-10-CM | POA: Diagnosis not present

## 2021-07-19 DIAGNOSIS — I5043 Acute on chronic combined systolic (congestive) and diastolic (congestive) heart failure: Secondary | ICD-10-CM | POA: Diagnosis not present

## 2021-07-19 DIAGNOSIS — Z7984 Long term (current) use of oral hypoglycemic drugs: Secondary | ICD-10-CM | POA: Diagnosis not present

## 2021-07-19 DIAGNOSIS — M6258 Muscle wasting and atrophy, not elsewhere classified, other site: Secondary | ICD-10-CM | POA: Diagnosis not present

## 2021-07-19 LAB — BASIC METABOLIC PANEL
Anion gap: 10 (ref 5–15)
BUN: 59 mg/dL — ABNORMAL HIGH (ref 8–23)
CO2: 28 mmol/L (ref 22–32)
Calcium: 8.7 mg/dL — ABNORMAL LOW (ref 8.9–10.3)
Chloride: 96 mmol/L — ABNORMAL LOW (ref 98–111)
Creatinine, Ser: 2.17 mg/dL — ABNORMAL HIGH (ref 0.61–1.24)
GFR, Estimated: 29 mL/min — ABNORMAL LOW (ref >60)
Glucose, Bld: 85 mg/dL (ref 70–99)
Potassium: 4.4 mmol/L (ref 3.5–5.1)
Sodium: 134 mmol/L — ABNORMAL LOW (ref 135–145)

## 2021-07-19 LAB — GLUCOSE, CAPILLARY
Glucose-Capillary: 82 mg/dL (ref 70–99)
Glucose-Capillary: 89 mg/dL (ref 70–99)

## 2021-07-19 MED ORDER — TORSEMIDE 40 MG PO TABS: 40.0000 mg | ORAL_TABLET | Freq: Every day | ORAL | Status: DC

## 2021-07-19 MED ORDER — LEVOTHYROXINE SODIUM 50 MCG PO TABS: 50.0000 ug | ORAL_TABLET | Freq: Every day | ORAL | 0 refills | Status: DC

## 2021-07-19 MED ORDER — EMPAGLIFLOZIN 10 MG PO TABS: 10.0000 mg | ORAL_TABLET | Freq: Every day | ORAL | Status: DC

## 2021-07-19 NOTE — Progress Notes (Signed)
Mobility Specialist Progress Note: ? ? 07/19/21 1040  ?Mobility  ?Activity Transferred to/from [facility]  ?Level of Assistance Moderate assist, patient does 50-74%  ?Assistive Device Front wheel walker  ?Distance Ambulated (ft) 4 ft  ?Activity Response Tolerated well  ?$Mobility charge 1 Mobility  ? ?Pt requesting to go to [facility] for BM. Required min-modA to stand and pivot to [facility] with RW. BM successful. Pt back in bed with all needs met. ? ?[place] Numbers ?Acute Rehab ?Phone: [phone] ?Office Phone: [phone] ? ?

## 2021-07-19 NOTE — NC FL2 (Addendum)
?[place] MEDICAID FL2 LEVEL OF CARE SCREENING TOOL  ?  ? ?IDENTIFICATION  ?Patient Name: ?Kevin Schwartz Birthdate: [birth year] Sex: male Admission Date (Current Location): ?07/11/2021  ?[place] and [place] Number: ? [place] ?  Facility and Address:  ?The [facility] [place] [address] [place], [place] [address] ?     Provider Number: ?[id number]  ?Attending Physician Name and Address:  ?Domenic Polite, MD ? Relative Name and Phone Number:  ?Nahuel, Wilbert (Sister)   [phone] ?   ?Current Level of Care: ?Hospital Recommended Level of Care: ?[place] Prior Approval Number: ?  ? ?Date Approved/Denied: ?  PASRR Number: ?[id number] A ? ?Discharge Plan: ?SNF ?  ? ?Current Diagnoses: ?Patient Active Problem List  ? Diagnosis Date Noted  ? Acute on chronic combined systolic and diastolic CHF (congestive heart failure) ([place]) 07/11/2021  ? Acute respiratory failure with hypoxia ([place]) 07/11/2021  ? Leg wound, left 07/11/2021  ? Atrial flutter ([place]) 07/11/2021  ? Acute CHF (congestive heart failure) ([place]) 04/16/2021  ? PNA (pneumonia) 04/16/2021  ? COVID-19   ? Elevated troponin   ? NSTEMI (non-ST elevated myocardial infarction) ([place]) 07/31/2020  ? Grieving 02/16/2020  ? Bilateral inguinal hernia s/p lap hernia repair w mesh 10/30/2017 10/30/2017  ? Left femoral hernia s/p lap hernia repair w mesh 10/30/2017 10/30/2017  ? Loss of weight 01/21/2016  ? Heme positive stool 01/21/2016  ? Heart murmur 08/18/2013  ? Chronic kidney disease, stage III (moderate) ([place]) 07/14/2013  ? Hyperlipidemia LDL goal < 100 07/14/2013  ? Essential hypertension, benign 07/14/2013  ? Type II or unspecified type diabetes mellitus with renal manifestations, not stated as uncontrolled(250.40) 07/14/2013  ? Insomnia 07/14/2013  ? Angioedema of lips 12/26/2012  ? MGUS (monoclonal gammopathy of unknown significance) 08/01/2011  ? Gout of right wrist 08/01/2011  ? S/P coronary artery stent placement 08/01/2011   ? DM type 2 causing CKD stage 2 ([place]) 08/01/2011  ? Bradycardia 10/15/2010  ? Mixed hyperlipidemia due to type 2 diabetes mellitus ([place]) 05/01/2010  ? Essential hypertension 05/01/2010  ? CAD, NATIVE VESSEL 05/01/2010  ? PVD 05/01/2010  ? ? ?Orientation RESPIRATION BLADDER Height & Weight   ?  ?Self, Time, Situation, Place ? Normal Continent, External catheter Weight: 157 lb 6.5 oz (71.4 kg) ?Height:  '5\' 8"'$  (172.7 cm)  ?BEHAVIORAL SYMPTOMS/MOOD NEUROLOGICAL BOWEL NUTRITION STATUS  ?    Continent Diet  ?AMBULATORY STATUS COMMUNICATION OF NEEDS Skin   ?Limited Assist   Surgical wounds (Non-pressure wound/heel left) ?  ?  ?  ?    ?     ?     ? ? ?Personal Care Assistance Level of Assistance  ?Bathing, Feeding, Dressing Bathing Assistance: Limited assistance ?Feeding assistance: Independent ?Dressing Assistance: Limited assistance ?   ? ?Functional Limitations Info  ?Sight, Hearing, Speech Sight Info: Impaired ?Hearing Info: Impaired ?Speech Info: Adequate  ? ? ?SPECIAL CARE FACTORS FREQUENCY  ?PT (By licensed PT), OT (By licensed OT)   ?  ?PT Frequency: 5x a week ?OT Frequency: 5x a week ?  ?  ?  ?   ? ? ?Contractures Contractures Info: Not present  ? ? ?Additional Factors Info  ?Code Status, Allergies Code Status Info: DNR ?Allergies Info: Lisinopril   Losartan Potassium   Crestor (Rosuvastatin)   Tape ?  ?  ?  ?   ? ?Current Medications (07/19/2021):  This is the current hospital active medication list ?Current Facility-Administered Medications  ?Medication Dose Route Frequency Provider Last  Rate Last Admin  ? 0.9 %  sodium chloride infusion  250 mL Intravenous PRN Toy Baker, MD      ? acetaminophen (TYLENOL) tablet 650 mg  650 mg Oral Q6H PRN Toy Baker, MD      ? Or  ? acetaminophen (TYLENOL) suppository 650 mg  650 mg Rectal Q6H PRN Doutova, Anastassia, MD      ? albuterol (PROVENTIL) (2.5 MG/3ML) 0.083% nebulizer solution 2.5 mg  2.5 mg Nebulization Q2H PRN Doutova, Anastassia, MD      ?  allopurinol (ZYLOPRIM) tablet 100 mg  100 mg Oral Daily Doutova, Anastassia, MD   100 mg at 07/19/21 5462  ? apixaban (ELIQUIS) tablet 2.5 mg  2.5 mg Oral BID Toy Baker, MD   2.5 mg at 07/19/21 0929  ? aspirin EC tablet 81 mg  81 mg Oral Daily Toy Baker, MD   81 mg at 07/19/21 0929  ? citalopram (CELEXA) tablet 20 mg  20 mg Oral Daily Doutova, Anastassia, MD   20 mg at 07/19/21 0929  ? dapagliflozin propanediol (FARXIGA) tablet 10 mg  10 mg Oral Daily Domenic Polite, MD   10 mg at 07/19/21 7035  ? ezetimibe (ZETIA) tablet 10 mg  10 mg Oral QHS Chandrasekhar, Mahesh A, MD   10 mg at 07/18/21 2128  ? feeding supplement (ENSURE ENLIVE / ENSURE PLUS) liquid 237 mL  237 mL Oral BID BM Domenic Polite, MD   237 mL at 07/19/21 0930  ? guaiFENesin (MUCINEX) 12 hr tablet 600 mg  600 mg Oral BID Toy Baker, MD   600 mg at 07/19/21 0929  ? HYDROcodone-acetaminophen (NORCO/VICODIN) 5-325 MG per tablet 1-2 tablet  1-2 tablet Oral Q4H PRN Toy Baker, MD   1 tablet at 07/18/21 1852  ? HYDROmorphone (DILAUDID) injection 0.5 mg  0.5 mg Intravenous Q4H PRN Domenic Polite, MD   0.5 mg at 07/17/21 1128  ? insulin aspart (novoLOG) injection 0-9 Units  0-9 Units Subcutaneous TID WC Domenic Polite, MD   1 Units at 07/18/21 1217  ? levothyroxine (SYNTHROID) tablet 50 mcg  50 mcg Oral Q0600 Domenic Polite, MD   50 mcg at 07/19/21 0548  ? multivitamin with minerals tablet 1 tablet  1 tablet Oral Daily Domenic Polite, MD   1 tablet at 07/19/21 0093  ? sodium chloride flush (NS) 0.9 % injection 3 mL  3 mL Intravenous Q12H Doutova, Anastassia, MD   3 mL at 07/19/21 0930  ? sodium chloride flush (NS) 0.9 % injection 3 mL  3 mL Intravenous PRN Toy Baker, MD   3 mL at 07/12/21 0949  ? tamsulosin (FLOMAX) capsule 0.4 mg  0.4 mg Oral Daily Doutova, Anastassia, MD   0.4 mg at 07/19/21 0929  ? torsemide (DEMADEX) tablet 20 mg  20 mg Oral Daily Chandrasekhar, Mahesh A, MD   20 mg at 07/19/21 8182   ? ? ? ?Discharge Medications: ?Please see discharge summary for a list of discharge medications. ? ?Relevant Imaging Results: ? ?Relevant Lab Results: ? ? ?Additional Information ?SSN: [id number]. Pfizer vaccines 06/16/19, 07/08/19, 02/16/20. COVID + on 04/16/21 ? ?Jaloni Davoli Renold Don, LCSWA ? ? ? ? ?

## 2021-07-19 NOTE — TOC Transition Note (Addendum)
Transition of Care (TOC) - CM/SW Discharge Note ? ? ?Patient Details  ?Name: Earnest Mcgillis ?MRN: [id number] ?Date of Birth: Jul 19, 1930 ? ?Transition of Care (TOC) CM/SW Contact:  ?Reece Agar, LCSWA ?Phone Number: ?07/19/2021, 11:50 AM ? ? ?Clinical Narrative:    ?Patient will DC to: [place] ?Anticipated DC date: 07/19/2021 ?Family notified: Sister and son ?Transport by: Corey Harold ? ? ?Per MD patient ready for DC to [facility] room 1008P. RN to call report prior to discharge [phone]). RN, patient, patient's family, and facility notified of DC. Discharge Summary and FL2 sent to facility. DC packet on chart. Ambulance transport requested for patient.  ? ?CSW will sign off for now as social work intervention is no longer needed. Please consult [place] again if new needs arise. ?  ? ? ?  ?Barriers to Discharge: Continued Medical Work up, Ship broker ? ? ?Patient Goals and CMS Choice ?  ?  ?  ? ?Discharge Placement ?  ?           ?  ?  ?  ?  ? ?Discharge Plan and Services ?  ?  ?Post Acute Care Choice: [place]          ?  ?  ?  ?  ?  ?  ?  ?  ?  ?  ? ?Social Determinants of Health (SDOH) Interventions ?  ? ? ?Readmission Risk Interventions ?   ? View : No data to display.  ?  ?  ?  ? ? ? ? ? ?

## 2021-07-19 NOTE — Discharge Summary (Addendum)
Physician Discharge Summary  ?Aidyn Kellis OFB:[id number] DOB: 1931-02-26 DOA: 07/11/2021 ? ?PCP: [name], MD ? ?Admit date: 07/11/2021 ?Discharge date: 07/19/2021 ? ?Time spent: 35 minutes ? ?Recommendations for Outpatient Follow-up:  ?TOC heart failure clinic 4/19 ?Cardiology Dr. Haroldine Laws in 1 month ?Palliative care follow-up at SNF ?Please check BMP in 1 week ? ? ?Discharge Diagnoses:  ?Principal Problem: ?  Acute on chronic combined systolic and diastolic CHF (congestive heart failure) ([place]) ?Moderate to severe MR ?Severe pulmonary hypertension ?  Acute respiratory failure with hypoxia ([place]) ?  Essential hypertension ?  Mixed hyperlipidemia due to type 2 diabetes mellitus ([place]) ?  CAD, NATIVE VESSEL ?  DM type 2 causing CKD stage 2 ([place]) ?  MGUS (monoclonal gammopathy of unknown significance) ?  Chronic kidney disease, stage III (moderate) ([facility]) ?  Elevated troponin ?  Leg wound, left ?  Atrial flutter ([place]) ?DO NOT RESUSCITATE ? ? ?Discharge Condition: Improved ? ?Diet recommendation: Low-sodium, diabetic ? ?Filed Weights  ? 07/17/21 0503 07/18/21 0447 07/19/21 0352  ?Weight: 75.2 kg 72.5 kg 71.4 kg  ? ? ?History of present illness:  ?89/M with history of CAD, multiple PCI/stents, ischemic cardiomyopathy EF of 45%, severe PAH, mitral regurgitation, type 2 diabetes mellitus, hypertension, dyslipidemia, CKD 3a, history of renal artery stenosis status post stenting in 2008, paroxysmal atrial flutter on Eliquis, history of recurrent epistaxis was brought to the ED 3/30 with respiratory distress.  Chest x-ray noted basilar atelectasis, small pleural effusion, labs noted creatinine of 2.0, BNP 1590, troponin of 50 ? ?Hospital Course:  ? ?Acute on chronic systolic CHF/ICM ?Mod to severe MR ?Severe Pulm Hypertension ?-Last echo in 4/22 with EF of 45% ?-Diuresed with IV Lasix, he is 15 L negative ?-Cardiology following switch to oral torsemide yesterday ?-Continue carvedilol, started on  Farxiga ?-Discharged on torsemide 40 Mg daily ?-GDMT limited by CKD  ?-Discharge back to SNF today, discussed with patient regarding palliative care follow-up ?  ?AKI on CKD 3a ?-Baseline creatinine around 1.6-2,  ?-Creatinine now stable in the 2 range ?  ?History of CAD ?-had NSTEMI in 4/22, cath noted multivessel CAD- 85% proximal RCA, 85% mid RCA, 90% distal RCA, 80% mid LAD, 80% OM 2 lesion, on 08/02/2020 underwent staged PCI with DES to the mid LAD, balloon angioplasty of left circumflex lesion, the diffusely diseased RCA was treated medically. ?-Continue Plavix, Eliquis, statin ?-Troponins minimally elevated, no symptoms of ACS ?  ?Paroxysmal Atrial Flutter ?-Continue Eliquis, carvedilol ?  ?Abnormal TSH ?-Significantly elevated at 35-repeat elevated as well, patient unable to provide any information about history of hypothyroidism or Synthroid use, T4 and T3 were normal ?-Started on low-dose 50 mcg Synthroid ?-Recheck TSH in 6 weeks ?  ?History of recurrent epistaxis ?-Ongoing frequent issue, complicated by Plavix, Eliquis use ?-Recently had anterior nasal packing this was removed 2 days ago ?  ?Iron Defi anemia ?-Continue IV iron, hemoglobin stable now ?  ?T2DM ?-CBG stable, started on Farxiga ?  ?H/o MGUS ?-fu with Heme ?  ?L foot wound ?-Woc COnsult appreciated ?  ?Code Status: DO NOT RESUSCITATE ? ? ?Consultations: ?Cardiology ? ?Discharge Exam: ?Vitals:  ? 07/19/21 0352 07/19/21 0928  ?BP: 98/64 109/70  ?Pulse: 92   ?Resp: 20   ?Temp: 98.1 ?F (36.7 ?C)   ?SpO2: 98%   ? ? ? ? ?Discharge Instructions ? ? ?Discharge Instructions   ? ? Diet - low sodium heart healthy   Complete by: As directed ?  ? Discharge  wound care:   Complete by: As directed ?  ? Clean both feet with soap and water, rinse and pat dry, apply a piece of Aquacel advantage over left heel wound, cover with 4 x 4, wrapped the foot with Kerlix, change daily  ? Increase activity slowly   Complete by: As directed ?  ? ?  ? ?Allergies as of  07/19/2021   ? ?   Reactions  ? Lisinopril Swelling, Other (See Comments)  ? Angioedema  ? Losartan Potassium Swelling, Other (See Comments)  ? Angioedema  ? Crestor [rosuvastatin] Swelling, Other (See Comments)  ? Angioedema  ? Tape Other (See Comments)  ? Irritates the skin  ? ?  ? ?  ?Medication List  ?  ? ?STOP taking these medications   ? ?carvedilol 3.125 MG tablet ?Commonly known as: COREG ?  ?clopidogrel 75 MG tablet ?Commonly known as: PLAVIX ?  ?furosemide 20 MG tablet ?Commonly known as: LASIX ?  ?spironolactone 25 MG tablet ?Commonly known as: ALDACTONE ?  ? ?  ? ?TAKE these medications   ? ?Accu-Chek Aviva Plus test strip ?Generic drug: glucose blood ?Use to check blood sugar daily. Dx:E11.8 ?  ?Accu-Chek Softclix Lancets lancets ?Use to test blood sugar daily. Dx:E11.8 ?  ?acetaminophen 500 MG tablet ?Commonly known as: TYLENOL ?Take 1,000 mg by mouth every 8 (eight) hours as needed for moderate pain. ?  ?allopurinol 100 MG tablet ?Commonly known as: ZYLOPRIM ?Take 1 tablet (100 mg total) by mouth daily. ?  ?apixaban 2.5 MG Tabs tablet ?Commonly known as: ELIQUIS ?Take 1 tablet (2.5 mg total) by mouth 2 (two) times daily. ?  ?aspirin EC 81 MG tablet ?Take 81 mg by mouth daily. Swallow whole. ?  ?atorvastatin 80 MG tablet ?Commonly known as: LIPITOR ?Take 1 tablet (80 mg total) by mouth daily. ?What changed: when to take this ?  ?B-D SINGLE USE SWABS REGULAR Pads ?Use with testing of blood sugar. Dx:E11.8 ?  ?cetirizine 10 MG tablet ?Commonly known as: ZYRTEC ?Take 10 mg by mouth at bedtime. ?  ?citalopram 20 MG tablet ?Commonly known as: CELEXA ?Take 1 tablet (20 mg total) by mouth daily. ?  ?diphenhydrAMINE 25 MG tablet ?Commonly known as: BENADRYL ?Take 25 mg by mouth 2 (two) times daily as needed for itching. ?  ?empagliflozin 10 MG Tabs tablet ?Commonly known as: Jardiance ?Take 1 tablet (10 mg total) by mouth daily. ?  ?eucerin lotion ?Apply 1 application. topically daily. Apply to rash on  chest,  abdomen, back,neck and shoulders ?  ?ezetimibe 10 MG tablet ?Commonly known as: ZETIA ?Take 10 mg by mouth daily. ?  ?guaiFENesin 600 MG 12 hr tablet ?Commonly known as: [place] ?Take 600 mg by mouth 2 (two) times daily. ?  ?ipratropium-albuterol 0.5-2.5 (3) MG/3ML Soln ?Commonly known as: DUONEB ?Inhale 3 mLs into the lungs 3 (three) times daily. ?  ?levothyroxine 50 MCG tablet ?Commonly known as: SYNTHROID ?Take 1 tablet (50 mcg total) by mouth daily at 6 (six) AM. ?Start taking on: July 20, 2021 ?  ?lidocaine 5 % ?Commonly known as: Lidoderm ?Place 1 patch onto the skin daily. Remove & Discard patch within 12 hours or as directed by MD ?What changed:  ?when to take this ?additional instructions ?  ?nitroGLYCERIN 0.4 MG SL tablet ?Commonly known as: NITROSTAT ?DISSOLVE 1 TABLET UNDER THE TONGUE EVERY 5 MINUTES AS  NEEDED FOR CHEST PAIN. MAX  OF 3 TABLETS IN 15 MINUTES. CALL 911 IF PAIN PERSISTS. ?What changed:  ?  how much to take ?how to take this ?when to take this ?reasons to take this ?additional instructions ?  ?tamsulosin 0.4 MG Caps capsule ?Commonly known as: FLOMAX ?Take 0.4 mg by mouth daily. ?  ?Torsemide 40 MG Tabs ?Take 40 mg by mouth daily. ?  ?white petrolatum Gel ?Commonly known as: VASELINE ?Apply 1 application. topically 3 (three) times daily as needed (inside bilateral nares). ?  ?Zinc Oxide 25 % Pste ?Apply 1 application. topically in the morning and at bedtime. Apply to bottom during incontinence care ?  ? ?  ? ?  ?  ? ? ?  ?Discharge Care Instructions  ?(From admission, onward)  ?  ? ? ?  ? ?  Start     Ordered  ? 07/19/21 0000  Discharge wound care:       ?Comments: Clean both feet with soap and water, rinse and pat dry, apply a piece of Aquacel advantage over left heel wound, cover with 4 x 4, wrapped the foot with Kerlix, change daily  ? 07/19/21 0919  ? ?  ?  ? ?  ? ?Allergies  ?Allergen Reactions  ? Lisinopril Swelling and Other (See Comments)  ?  Angioedema ?  ? Losartan Potassium  Swelling and Other (See Comments)  ?  Angioedema ?  ? Crestor [Rosuvastatin] Swelling and Other (See Comments)  ?  Angioedema ?  ? Tape Other (See Comments)  ?  Irritates the skin  ? ? Contact information for follow-up providers   ?

## 2021-07-22 ENCOUNTER — Emergency Department (HOSPITAL_COMMUNITY): Payer: Medicare HMO

## 2021-07-22 ENCOUNTER — Emergency Department (HOSPITAL_COMMUNITY)
Admission: EM | Admit: 2021-07-22 | Discharge: 2021-07-22 | Disposition: A | Discharge status: Skilled Nursing Facility | Payer: Medicare HMO | Attending: Emergency Medicine | Admitting: Emergency Medicine

## 2021-07-22 ENCOUNTER — Other Ambulatory Visit: Payer: Self-pay

## 2021-07-22 ENCOUNTER — Telehealth: Payer: Self-pay | Admitting: *Deleted

## 2021-07-22 DIAGNOSIS — S0990XA Unspecified injury of head, initial encounter: Secondary | ICD-10-CM | POA: Diagnosis not present

## 2021-07-22 DIAGNOSIS — E1122 Type 2 diabetes mellitus with diabetic chronic kidney disease: Secondary | ICD-10-CM | POA: Diagnosis not present

## 2021-07-22 DIAGNOSIS — W06XXXA Fall from bed, initial encounter: Secondary | ICD-10-CM | POA: Insufficient documentation

## 2021-07-22 DIAGNOSIS — N183 Chronic kidney disease, stage 3 unspecified: Secondary | ICD-10-CM | POA: Insufficient documentation

## 2021-07-22 DIAGNOSIS — Z7982 Long term (current) use of aspirin: Secondary | ICD-10-CM | POA: Diagnosis not present

## 2021-07-22 DIAGNOSIS — Z7984 Long term (current) use of oral hypoglycemic drugs: Secondary | ICD-10-CM | POA: Diagnosis not present

## 2021-07-22 DIAGNOSIS — R519 Headache, unspecified: Secondary | ICD-10-CM | POA: Diagnosis not present

## 2021-07-22 DIAGNOSIS — S199XXA Unspecified injury of neck, initial encounter: Secondary | ICD-10-CM | POA: Diagnosis not present

## 2021-07-22 DIAGNOSIS — I2510 Atherosclerotic heart disease of native coronary artery without angina pectoris: Secondary | ICD-10-CM | POA: Insufficient documentation

## 2021-07-22 DIAGNOSIS — S0003XA Contusion of scalp, initial encounter: Secondary | ICD-10-CM | POA: Diagnosis not present

## 2021-07-22 DIAGNOSIS — Z79899 Other long term (current) drug therapy: Secondary | ICD-10-CM | POA: Diagnosis not present

## 2021-07-22 DIAGNOSIS — Z7901 Long term (current) use of anticoagulants: Secondary | ICD-10-CM | POA: Insufficient documentation

## 2021-07-22 DIAGNOSIS — I4892 Unspecified atrial flutter: Secondary | ICD-10-CM | POA: Insufficient documentation

## 2021-07-22 DIAGNOSIS — G9341 Metabolic encephalopathy: Secondary | ICD-10-CM | POA: Diagnosis not present

## 2021-07-22 DIAGNOSIS — R040 Epistaxis: Secondary | ICD-10-CM | POA: Diagnosis not present

## 2021-07-22 DIAGNOSIS — I5043 Acute on chronic combined systolic (congestive) and diastolic (congestive) heart failure: Secondary | ICD-10-CM | POA: Diagnosis not present

## 2021-07-22 DIAGNOSIS — I509 Heart failure, unspecified: Secondary | ICD-10-CM | POA: Diagnosis not present

## 2021-07-22 DIAGNOSIS — I130 Hypertensive heart and chronic kidney disease with heart failure and stage 1 through stage 4 chronic kidney disease, or unspecified chronic kidney disease: Secondary | ICD-10-CM | POA: Insufficient documentation

## 2021-07-22 DIAGNOSIS — W19XXXA Unspecified fall, initial encounter: Secondary | ICD-10-CM | POA: Diagnosis not present

## 2021-07-22 DIAGNOSIS — I517 Cardiomegaly: Secondary | ICD-10-CM | POA: Diagnosis not present

## 2021-07-22 DIAGNOSIS — I10 Essential (primary) hypertension: Secondary | ICD-10-CM | POA: Diagnosis not present

## 2021-07-22 LAB — COMPREHENSIVE METABOLIC PANEL
ALT: 25 U/L (ref 0–44)
AST: 40 U/L (ref 15–41)
Albumin: 3.5 g/dL (ref 3.5–5.0)
Alkaline Phosphatase: 86 U/L (ref 38–126)
Anion gap: 10 (ref 5–15)
BUN: 52 mg/dL — ABNORMAL HIGH (ref 8–23)
CO2: 28 mmol/L (ref 22–32)
Calcium: 8.7 mg/dL — ABNORMAL LOW (ref 8.9–10.3)
Chloride: 99 mmol/L (ref 98–111)
Creatinine, Ser: 2.1 mg/dL — ABNORMAL HIGH (ref 0.61–1.24)
GFR, Estimated: 30 mL/min — ABNORMAL LOW (ref >60)
Glucose, Bld: 108 mg/dL — ABNORMAL HIGH (ref 70–99)
Potassium: 4.6 mmol/L (ref 3.5–5.1)
Sodium: 137 mmol/L (ref 135–145)
Total Bilirubin: 0.8 mg/dL (ref 0.3–1.2)
Total Protein: 7.7 g/dL (ref 6.5–8.1)

## 2021-07-22 LAB — CBC
HCT: 29 % — ABNORMAL LOW (ref 39.0–52.0)
Hemoglobin: 9.5 g/dL — ABNORMAL LOW (ref 13.0–17.0)
MCH: 30.8 pg (ref 26.0–34.0)
MCHC: 32.8 g/dL (ref 30.0–36.0)
MCV: 94.2 fL (ref 80.0–100.0)
Platelets: 369 10*3/uL (ref 150–400)
RBC: 3.08 MIL/uL — ABNORMAL LOW (ref 4.22–5.81)
RDW: 19.2 % — ABNORMAL HIGH (ref 11.5–15.5)
WBC: 7.4 10*3/uL (ref 4.0–10.5)
nRBC: 0 % (ref 0.0–0.2)

## 2021-07-22 NOTE — Progress Notes (Signed)
Orthopedic Tech Progress Note ?Patient Details:  ?Kevin Schwartz ?1930/12/06 ?[id number] ? ?Patient ID: Kevin Schwartz, male   DOB: 1931/02/20, 88 y.o.   MRN: [id number] ? ?Kevin Schwartz ?07/22/2021, 11:19 AM ?Responded to level 2 trauma. ?

## 2021-07-22 NOTE — ED Triage Notes (Signed)
Pt rolled out of bed while reaching for tissues due to his nose bleeding and hit his right temple. Pt on eliquis. Pt A/O and did not lose consciousness.  ? ?BP 124/72 ?HR 78 ?98% RA ?

## 2021-07-22 NOTE — Discharge Instructions (Signed)
Your CT scans showed no fractures and no bleeding.  If you do develop headaches, take Tylenol as needed.  Continue taking other medications as prescribed.  Follow-up with your primary care doctor as needed ?

## 2021-07-22 NOTE — ED Notes (Signed)
Pt verbalized understanding of d/c instructions, meds, and followup care-report given to SNF. Denies questions. VSS, no distress noted. [facility] arrives to transport pt. Transferred to stretcher without incident- paperwork and DNR goes with [facility] crew. ?

## 2021-07-22 NOTE — ED Provider Notes (Signed)
?[facility] ?Provider Note ? ? ?CSN: [id number] ?Arrival date & time: 07/22/21  1103 ? ?  ? ?History ? ?Chief Complaint  ?Patient presents with  ? Fall  ? ? ?Kevin Schwartz is a 88 y.o. male. ? ? ?Fall ?Associated symptoms include headaches. Patient presents after a fall.  His medical history includes HLD, HTN, CAD, MGUS, gout, DM, CKD, and atrial flutter.  Home medications include Eliquis.  He had a recent hospitalization for CHF.  He was discharged to SNF 3 days ago.  Earlier this morning, he did have a nosebleed.  While reaching for a piece of tissue, he fell out of bed.  He struck the right temporal area of his head.  He does not have any other areas of pain.  At baseline, patient has very limited mobility due to his chronic medical conditions.  He is working with PT at the moment.  He is supposed to be on supplemental oxygen. ? ?  ? ?Home Medications ?Prior to Admission medications   ?Medication Sig Start Date End Date Taking? Authorizing Provider  ?ACCU-CHEK SOFTCLIX LANCETS lancets Use to test blood sugar daily. Dx:E11.8 03/25/17   Reed, Tiffany L, DO  ?acetaminophen (TYLENOL) 500 MG tablet Take 1,000 mg by mouth every 8 (eight) hours as needed for moderate pain.    [provider]  ?Alcohol Swabs (B-D SINGLE USE SWABS REGULAR) PADS Use with testing of blood sugar. Dx:E11.8 03/25/17   Reed, Tiffany L, DO  ?allopurinol (ZYLOPRIM) 100 MG tablet Take 1 tablet (100 mg total) by mouth daily. 01/31/21   [name], MD  ?apixaban (ELIQUIS) 2.5 MG TABS tablet Take 1 tablet (2.5 mg total) by mouth 2 (two) times daily. 04/28/21   Elgergawy, Silver Huguenin, MD  ?aspirin EC 81 MG tablet Take 81 mg by mouth daily. Swallow whole.    [provider]  ?atorvastatin (LIPITOR) 80 MG tablet Take 1 tablet (80 mg total) by mouth daily. ?Patient taking differently: Take 80 mg by mouth at bedtime. 01/31/21 07/11/21  Ngetich, Dinah C, NP  ?cetirizine (ZYRTEC) 10 MG  tablet Take 10 mg by mouth at bedtime.    [provider]  ?citalopram (CELEXA) 20 MG tablet Take 1 tablet (20 mg total) by mouth daily. 01/31/21   [name], MD  ?diphenhydrAMINE (BENADRYL) 25 MG tablet Take 25 mg by mouth 2 (two) times daily as needed for itching.    [provider]  ?Emollient (EUCERIN) lotion Apply 1 application. topically daily. Apply to rash on  chest, abdomen, back,neck and shoulders    [provider]  ?empagliflozin (JARDIANCE) 10 MG TABS tablet Take 1 tablet (10 mg total) by mouth daily. 07/19/21   Domenic Polite, MD  ?ezetimibe (ZETIA) 10 MG tablet Take 10 mg by mouth daily.    [provider]  ?glucose blood (ACCU-CHEK AVIVA PLUS) test strip Use to check blood sugar daily. Dx:E11.8 09/05/19   Reed, Tiffany L, DO  ?guaiFENesin (MUCINEX) 600 MG 12 hr tablet Take 600 mg by mouth 2 (two) times daily.    [provider]  ?ipratropium-albuterol (DUONEB) 0.5-2.5 (3) MG/3ML SOLN Inhale 3 mLs into the lungs 3 (three) times daily. 06/17/21   [provider]  ?levothyroxine (SYNTHROID) 50 MCG tablet Take 1 tablet (50 mcg total) by mouth daily at 6 (six) AM. 07/20/21   Domenic Polite, MD  ?lidocaine (LIDODERM) 5 % Place 1 patch onto the skin daily. Remove & Discard patch within 12  hours or as directed by MD ?Patient taking differently: Place 1 patch onto the skin See admin instructions. Apply to left knee in the morning,remove at bedtime 04/01/20   Maudie Flakes, MD  ?nitroGLYCERIN (NITROSTAT) 0.4 MG SL tablet DISSOLVE 1 TABLET UNDER THE TONGUE EVERY 5 MINUTES AS  NEEDED FOR CHEST PAIN. MAX  OF 3 TABLETS IN 15 MINUTES. CALL 911 IF PAIN PERSISTS. ?Patient taking differently: Place 0.4 mg under the tongue every 5 (five) minutes as needed for chest pain. 11/08/20   [name], MD  ?tamsulosin (FLOMAX) 0.4 MG CAPS capsule Take 0.4 mg by mouth daily. 06/24/21   [provider]  ?torsemide 40 MG TABS Take 40 mg by mouth daily. 07/19/21    Domenic Polite, MD  ?white petrolatum (VASELINE) GEL Apply 1 application. topically 3 (three) times daily as needed (inside bilateral nares).    [provider]  ?Zinc Oxide 25 % PSTE Apply 1 application. topically in the morning and at bedtime. Apply to bottom during incontinence care    [provider]  ?   ? ?Allergies    ?Lisinopril, Losartan potassium, Crestor [rosuvastatin], and Tape   ? ?Review of Systems   ?Review of Systems  ?Neurological:  Positive for headaches.  ?All other systems reviewed and are negative. ? ?Physical Exam ?Updated Vital Signs ?BP 128/80   Pulse 85   Temp 98.8 ?F (37.1 ?C) (Oral)   Resp 18   Ht '5\' 8"'$  (1.727 m)   Wt 85.7 kg   SpO2 99%   BMI 28.74 kg/m?  ?Physical Exam ?Vitals and nursing note reviewed.  ?Constitutional:   ?   General: He is not in acute distress. ?   Appearance: Normal appearance. He is well-developed and normal weight. He is not ill-appearing, toxic-appearing or diaphoretic.  ?HENT:  ?   Head: Normocephalic.  ?   Comments: Small hematoma to right parietal region of scalp.  Overlying skin is intact. ?   Right Ear: External ear normal.  ?   Left Ear: External ear normal.  ?   Nose: Nose normal.  ?Eyes:  ?   Extraocular Movements: Extraocular movements intact.  ?   Conjunctiva/sclera: Conjunctivae normal.  ?Cardiovascular:  ?   Rate and Rhythm: Normal rate and regular rhythm.  ?   Heart sounds: No murmur heard. ?Pulmonary:  ?   Effort: Pulmonary effort is normal. No respiratory distress.  ?Abdominal:  ?   Palpations: Abdomen is soft.  ?   Tenderness: There is no abdominal tenderness.  ?Musculoskeletal:     ?   General: No swelling or tenderness.  ?   Cervical back: Normal range of motion and neck supple. No rigidity or tenderness.  ?Skin: ?   General: Skin is warm and dry.  ?   Capillary Refill: Capillary refill takes less than 2 seconds.  ?   Coloration: Skin is not jaundiced or pale.  ?Neurological:  ?   General: No focal deficit present.  ?    Mental Status: He is alert and oriented to person, place, and time.  ?   Cranial Nerves: No cranial nerve deficit.  ?   Sensory: No sensory deficit.  ?   Motor: No weakness.  ?   Coordination: Coordination normal.  ?Psychiatric:     ?   Mood and Affect: Mood normal.     ?   Behavior: Behavior normal.     ?   Thought Content: Thought content normal.     ?  Judgment: Judgment normal.  ? ? ?ED Results / Procedures / Treatments   ?Labs ?(all labs ordered are listed, but only abnormal results are displayed) ?Labs Reviewed  ?COMPREHENSIVE METABOLIC PANEL - Abnormal; Notable for the following components:  ?    Result Value  ? Glucose, Bld 108 (*)   ? BUN 52 (*)   ? Creatinine, Ser 2.10 (*)   ? Calcium 8.7 (*)   ? GFR, Estimated 30 (*)   ? All other components within normal limits  ?CBC - Abnormal; Notable for the following components:  ? RBC 3.08 (*)   ? Hemoglobin 9.5 (*)   ? HCT 29.0 (*)   ? RDW 19.2 (*)   ? All other components within normal limits  ? ? ?EKG ?None ? ?Radiology ?CT HEAD WO CONTRAST ? ?Result Date: 07/22/2021 ?CLINICAL DATA:  Head and neck trauma EXAM: CT HEAD WITHOUT CONTRAST CT CERVICAL SPINE WITHOUT CONTRAST TECHNIQUE: Multidetector CT imaging of the head and cervical spine was performed following the standard protocol without intravenous contrast. Multiplanar CT image reconstructions of the cervical spine were also generated. RADIATION DOSE REDUCTION: This exam was performed according to the departmental dose-optimization program which includes automated exposure control, adjustment of the mA and/or kV according to patient size and/or use of iterative reconstruction technique. COMPARISON:  None. FINDINGS: CT HEAD FINDINGS Brain: No evidence of acute infarction, hemorrhage, hydrocephalus, extra-axial collection or mass lesion/mass effect. Periventricular and deep white matter hypodensity. Vascular: No hyperdense vessel or unexpected calcification. Skull: Normal. Negative for fracture or focal lesion.  Sinuses/Orbits: No acute finding. Other: None. CT CERVICAL SPINE FINDINGS Alignment: Degenerative straightening of the normal cervical lordosis. Skull base and vertebrae: No acute fracture. No primary bone lesi

## 2021-07-22 NOTE — Telephone Encounter (Signed)
Transition Care Management Unsuccessful Follow-up Telephone Call ? ?Date of discharge and from where:  07/19/2021 [place] [facility] ? ?Attempts:  1st Attempt ? ?Reason for unsuccessful TCM follow-up call:  Unable to reach patient [facility] ? ?  ?

## 2021-07-23 DIAGNOSIS — N183 Chronic kidney disease, stage 3 unspecified: Secondary | ICD-10-CM | POA: Diagnosis not present

## 2021-07-23 DIAGNOSIS — I10 Essential (primary) hypertension: Secondary | ICD-10-CM | POA: Diagnosis not present

## 2021-07-23 DIAGNOSIS — I509 Heart failure, unspecified: Secondary | ICD-10-CM | POA: Diagnosis not present

## 2021-07-23 DIAGNOSIS — I5022 Chronic systolic (congestive) heart failure: Secondary | ICD-10-CM | POA: Diagnosis not present

## 2021-07-23 DIAGNOSIS — R040 Epistaxis: Secondary | ICD-10-CM | POA: Diagnosis not present

## 2021-07-23 DIAGNOSIS — E1159 Type 2 diabetes mellitus with other circulatory complications: Secondary | ICD-10-CM | POA: Diagnosis not present

## 2021-07-23 DIAGNOSIS — R7989 Other specified abnormal findings of blood chemistry: Secondary | ICD-10-CM | POA: Diagnosis not present

## 2021-07-23 DIAGNOSIS — I2510 Atherosclerotic heart disease of native coronary artery without angina pectoris: Secondary | ICD-10-CM | POA: Diagnosis not present

## 2021-07-23 DIAGNOSIS — I340 Nonrheumatic mitral (valve) insufficiency: Secondary | ICD-10-CM | POA: Diagnosis not present

## 2021-07-23 DIAGNOSIS — I739 Peripheral vascular disease, unspecified: Secondary | ICD-10-CM | POA: Diagnosis not present

## 2021-07-23 DIAGNOSIS — J984 Other disorders of lung: Secondary | ICD-10-CM | POA: Diagnosis not present

## 2021-07-23 DIAGNOSIS — I5043 Acute on chronic combined systolic (congestive) and diastolic (congestive) heart failure: Secondary | ICD-10-CM | POA: Diagnosis not present

## 2021-07-24 DIAGNOSIS — R042 Hemoptysis: Secondary | ICD-10-CM | POA: Diagnosis not present

## 2021-07-24 DIAGNOSIS — I4891 Unspecified atrial fibrillation: Secondary | ICD-10-CM | POA: Diagnosis not present

## 2021-07-24 DIAGNOSIS — E119 Type 2 diabetes mellitus without complications: Secondary | ICD-10-CM | POA: Diagnosis not present

## 2021-07-24 DIAGNOSIS — G9340 Encephalopathy, unspecified: Secondary | ICD-10-CM | POA: Diagnosis not present

## 2021-07-24 DIAGNOSIS — I5042 Chronic combined systolic (congestive) and diastolic (congestive) heart failure: Secondary | ICD-10-CM | POA: Diagnosis not present

## 2021-07-24 DIAGNOSIS — M6281 Muscle weakness (generalized): Secondary | ICD-10-CM | POA: Diagnosis not present

## 2021-07-24 DIAGNOSIS — A419 Sepsis, unspecified organism: Secondary | ICD-10-CM | POA: Diagnosis not present

## 2021-07-24 DIAGNOSIS — R0602 Shortness of breath: Secondary | ICD-10-CM | POA: Diagnosis not present

## 2021-07-24 DIAGNOSIS — U099 Post covid-19 condition, unspecified: Secondary | ICD-10-CM | POA: Diagnosis not present

## 2021-07-25 DIAGNOSIS — M6281 Muscle weakness (generalized): Secondary | ICD-10-CM | POA: Diagnosis not present

## 2021-07-25 DIAGNOSIS — G9340 Encephalopathy, unspecified: Secondary | ICD-10-CM | POA: Diagnosis not present

## 2021-07-25 DIAGNOSIS — R0602 Shortness of breath: Secondary | ICD-10-CM | POA: Diagnosis not present

## 2021-07-25 DIAGNOSIS — U099 Post covid-19 condition, unspecified: Secondary | ICD-10-CM | POA: Diagnosis not present

## 2021-07-25 DIAGNOSIS — I4891 Unspecified atrial fibrillation: Secondary | ICD-10-CM | POA: Diagnosis not present

## 2021-07-25 DIAGNOSIS — I5042 Chronic combined systolic (congestive) and diastolic (congestive) heart failure: Secondary | ICD-10-CM | POA: Diagnosis not present

## 2021-07-25 DIAGNOSIS — E119 Type 2 diabetes mellitus without complications: Secondary | ICD-10-CM | POA: Diagnosis not present

## 2021-07-25 DIAGNOSIS — A419 Sepsis, unspecified organism: Secondary | ICD-10-CM | POA: Diagnosis not present

## 2021-07-25 DIAGNOSIS — R042 Hemoptysis: Secondary | ICD-10-CM | POA: Diagnosis not present

## 2021-07-29 DIAGNOSIS — I509 Heart failure, unspecified: Secondary | ICD-10-CM | POA: Diagnosis not present

## 2021-07-29 DIAGNOSIS — I5042 Chronic combined systolic (congestive) and diastolic (congestive) heart failure: Secondary | ICD-10-CM | POA: Diagnosis not present

## 2021-07-29 DIAGNOSIS — M6281 Muscle weakness (generalized): Secondary | ICD-10-CM | POA: Diagnosis not present

## 2021-07-29 DIAGNOSIS — A419 Sepsis, unspecified organism: Secondary | ICD-10-CM | POA: Diagnosis not present

## 2021-07-29 DIAGNOSIS — J9601 Acute respiratory failure with hypoxia: Secondary | ICD-10-CM | POA: Diagnosis not present

## 2021-07-29 DIAGNOSIS — R042 Hemoptysis: Secondary | ICD-10-CM | POA: Diagnosis not present

## 2021-07-29 DIAGNOSIS — G9340 Encephalopathy, unspecified: Secondary | ICD-10-CM | POA: Diagnosis not present

## 2021-07-29 DIAGNOSIS — N183 Chronic kidney disease, stage 3 unspecified: Secondary | ICD-10-CM | POA: Diagnosis not present

## 2021-07-29 DIAGNOSIS — E119 Type 2 diabetes mellitus without complications: Secondary | ICD-10-CM | POA: Diagnosis not present

## 2021-07-29 DIAGNOSIS — U099 Post covid-19 condition, unspecified: Secondary | ICD-10-CM | POA: Diagnosis not present

## 2021-07-29 DIAGNOSIS — I4891 Unspecified atrial fibrillation: Secondary | ICD-10-CM | POA: Diagnosis not present

## 2021-07-29 DIAGNOSIS — I2510 Atherosclerotic heart disease of native coronary artery without angina pectoris: Secondary | ICD-10-CM | POA: Diagnosis not present

## 2021-07-29 DIAGNOSIS — R0602 Shortness of breath: Secondary | ICD-10-CM | POA: Diagnosis not present

## 2021-07-30 DIAGNOSIS — L988 Other specified disorders of the skin and subcutaneous tissue: Secondary | ICD-10-CM | POA: Diagnosis not present

## 2021-07-30 NOTE — Progress Notes (Signed)
? ?Advanced Heart Failure Clinic Note ? ?Patient ID: Kevin Schwartz, male   DOB: Apr 20, 1930, 88 y.o.   MRN: [id number] ? ?MVE:HMCNOB, [name], MD ?HF Cardioligst: Dr. Haroldine Laws ? ?HPI: ?Kevin Schwartz is an 88 y.o.male with  DM2, HTN, HL, mild renal insufficiency, RAS s/p stenting of L renal artery in 2008, CAD s/p multiple PCIs. ? ?Admitted 07/31/2020 with left shoulder pain.  EKG showed no obvious ST-T wave changes however does reveal Mobitz 1 heart block, beta-blocker was stopped.  He was ruled in for NSTEMI.  Echocardiogram /20/2022 showed EF 45 to 50%, hypokinesis of the LV basal mid anteroseptal wall, RVSP 28.2 mmHg, mild MR, mild AI, small pericardial effusion.  Cardiac catheterization 08/01/2020 with multivessel CAD with 85% proximal RCA, 85% mid RCA, 90% distal RCA, 80% mid LAD, 80% OM 2 lesion.  Patient was placed on aspirin and Plavix.  Patient returned to the Cath Lab on 08/02/2020 for staged PCI with DES to the mid LAD, balloon angioplasty of left circumflex lesion, the diffusely diseased RCA was treated medically. ? ?Put on a 14-day heart monitor:  ? ?1.  Predominant underlying rhythm was sinus rhythm with 1AVBP -  avg HR of 75 bpm. ?2.  Atrial Flutter occurred (39% burden), ranging from 35-143 bpm (avg of 68 bpm), the longest lasting 5 days 10 hours with an avg rate of 68 bpm. ?3. Five episodes of NSVT - longest was 10 beats ?4. Transient second Degree AV  Block-Mobitz I (Wenckebach) was present. ?5. Transient junctional rhythm was present with rates in mid 40s ?6. Rare PACs and PVCs ? ?Last visit with AHF clinic 2018 stable class II symptoms at that time. Follow up 7/22 to reestablish care, stable NYHA II symptoms, Farxiga 5 started and referred to ENT for epistaxis. ? ?Admitted 1/23 with COVID pneumonia, complicated by AKI, felt to be 2/2 to ATN from [place]. Renal u/s negative for hydronephrosis. SCr stabilized with holding Lasix, eventually restarted on home dose. Echo showed EF 45 to 50% with  global HK of LV, RV normal.  Moderate to severe MR noted (previously mild). Nephrology arranged outpatient follow up. ? ?Admitted 3/23 with a/c CHF. Diuresed with IV lasix. Echo showed EF 45-50%, LV mildly down with global LV HK, mild LVH, RV mildly reduced, severely elevated PASP 67.4 mmHg, moderate to severe MR, severe TR, mild to moderate AI. Also with epistaxis requiring anterior nasal packing. EP consulted due to Mobitz Type 1 with 3.4 second pauses. Recommended conservative management with no nodal blocking agents. Discharged back to SNF, weight 159.5 lbs.  ? ?Seen in ED 07/22/21 with fall, hit head. CT head negative. ? ?Today he returns for post hospital HF follow up with his son. Overall feeling fine. He is at [facility] with plans to stay there. He is working with PT. He wears 4L oxygen continuously. He is SOB with walking with a walker but says he is physically limited by wound on right foot, which is healing. Denies abnormal  bleeding, CP, dizziness, edema, or PND/Orthopnea. Appetite ok. No fever or chills. Taking all medications provided by facility. Son says he looks much better since discharge. ? ? ?Past Medical History:  ?Diagnosis Date  ? Anemia due to chronic kidney disease   ? Bilateral inguinal hernia   ? CAD (coronary artery disease) cardiologist-  dr bensimhon  ? PTCA of OM in 1998, BMS OM in 2000, Cath 9/07 LM ok LAD ok. LCX 95% in OM prior to previous stent RCA. nondominant  normal EF normal. Cypher DES to OM 2007 (PLACED PROXIAMAL TO PREVIOUS STENT)  ? Chronic kidney disease, stage III (moderate) (HCC)   ? nephrologist-  dr detarding  ? Depression   ? Diabetes mellitus type 2, diet-controlled ([place])   ? followed by pcp,  last A1c 5.2 on 09-10-2017 in epic  ? Gout, unspecified   ? 10-29-2017  per pt stable  ? HLD (hyperlipidemia)   ? HTN (hypertension)   ? MGUS (monoclonal gammopathy of unknown significance) previously followed by dr Beryle Beams , [facility] and released 08/01/2011  ? IgG kappa dx 2002  8% plasma cells in bone marrow; no lesions on bone X-rays;  ? Nocturia   ? OA (osteoarthritis)   ? OSA on CPAP   ? per study 01-30-2004  severe osa , AHI 51.6/hr  ? PAD (peripheral artery disease) ([place])   ? 05-21-2006  left renal artery stenosis, s/p balloon angioplasty and stenting;  last duplex 02/ 2012  normal , arteries patent  ? S/P coronary artery stent placement   ? 2000--  BMS x1  to OM;   2007-- DES x1  to OM proximal to previous stent  ? Secondary hyperparathyroidism of renal origin [facility])   ? Urgency of urination   ? Wears glasses   ? ? ?Current Outpatient Medications  ?Medication Sig Dispense Refill  ? ACCU-CHEK SOFTCLIX LANCETS lancets Use to test blood sugar daily. Dx:E11.8 300 each 3  ? acetaminophen (TYLENOL) 500 MG tablet Take 1,000 mg by mouth every 8 (eight) hours as needed for moderate pain.    ? Alcohol Swabs (B-D SINGLE USE SWABS REGULAR) PADS Use with testing of blood sugar. Dx:E11.8 300 each 3  ? allopurinol (ZYLOPRIM) 100 MG tablet Take 1 tablet (100 mg total) by mouth daily. 90 tablet 3  ? apixaban (ELIQUIS) 2.5 MG TABS tablet Take 1 tablet (2.5 mg total) by mouth 2 (two) times daily. 60 tablet   ? aspirin EC 81 MG tablet Take 81 mg by mouth daily. Swallow whole.    ? atorvastatin (LIPITOR) 80 MG tablet Take 1 tablet (80 mg total) by mouth daily. (Patient taking differently: Take 80 mg by mouth at bedtime.) 90 tablet 3  ? empagliflozin (JARDIANCE) 10 MG TABS tablet Take 1 tablet (10 mg total) by mouth daily. 30 tablet   ? ezetimibe (ZETIA) 10 MG tablet Take 10 mg by mouth daily.    ? torsemide 40 MG TABS Take 40 mg by mouth daily.    ? cetirizine (ZYRTEC) 10 MG tablet Take 10 mg by mouth at bedtime.    ? citalopram (CELEXA) 20 MG tablet Take 1 tablet (20 mg total) by mouth daily. 90 tablet 3  ? diphenhydrAMINE (BENADRYL) 25 MG tablet Take 25 mg by mouth 2 (two) times daily as needed for itching.    ? Emollient (EUCERIN) lotion Apply 1 application. topically daily. Apply to rash on  chest,  abdomen, back,neck and shoulders    ? glucose blood (ACCU-CHEK AVIVA PLUS) test strip Use to check blood sugar daily. Dx:E11.8 300 each 3  ? guaiFENesin (MUCINEX) 600 MG 12 hr tablet Take 600 mg by mouth 2 (two) times daily.    ? ipratropium-albuterol (DUONEB) 0.5-2.5 (3) MG/3ML SOLN Inhale 3 mLs into the lungs 3 (three) times daily.    ? levothyroxine (SYNTHROID) 50 MCG tablet Take 1 tablet (50 mcg total) by mouth daily at 6 (six) AM. 30 tablet 0  ? lidocaine (LIDODERM) 5 % Place 1 patch onto the skin  daily. Remove & Discard patch within 12 hours or as directed by MD (Patient taking differently: Place 1 patch onto the skin See admin instructions. Apply to left knee in the morning,remove at bedtime) 5 patch 0  ? nitroGLYCERIN (NITROSTAT) 0.4 MG SL tablet DISSOLVE 1 TABLET UNDER THE TONGUE EVERY 5 MINUTES AS  NEEDED FOR CHEST PAIN. MAX  OF 3 TABLETS IN 15 MINUTES. CALL 911 IF PAIN PERSISTS. (Patient taking differently: Place 0.4 mg under the tongue every 5 (five) minutes as needed for chest pain.) 75 tablet 1  ? tamsulosin (FLOMAX) 0.4 MG CAPS capsule Take 0.4 mg by mouth daily.    ? white petrolatum (VASELINE) GEL Apply 1 application. topically 3 (three) times daily as needed (inside bilateral nares).    ? Zinc Oxide 25 % PSTE Apply 1 application. topically in the morning and at bedtime. Apply to bottom during incontinence care    ? ?No current facility-administered medications for this encounter.  ? ?Social History  ? ?Tobacco Use  ? Smoking status: Some Days  ?  Packs/day: 0.25  ?  Types: Cigarettes  ? Smokeless tobacco: Never  ? Tobacco comments:  ?  10-29-2017  per pt was smoking 1pp2d, stopped June 2019  ?Vaping Use  ? Vaping Use: Never used  ?Substance Use Topics  ? Alcohol use: Not Currently  ? Drug use: No  ? ?Family History  ?Problem Relation Age of Onset  ? Heart disease Mother   ? Healthy Father   ? Heart disease Sister   ? Heart disease Sister   ? Cancer Sister   ?     type unknown  ? Hypertension Sister    ? Diabetes Brother   ? Diabetes Brother   ? Diabetes Other   ? Coronary artery disease Other   ? Cancer Other   ? ?Wt Readings from Last 3 Encounters:  ?07/31/21 70.8 kg (156 lb)  ?07/22/21 85.7 kg (1

## 2021-07-31 ENCOUNTER — Ambulatory Visit (HOSPITAL_COMMUNITY)
Admit: 2021-07-31 | Discharge: 2021-07-31 | Disposition: A | Discharge status: Home / Self Care | Payer: No Typology Code available for payment source | Attending: Family Medicine | Admitting: Family Medicine

## 2021-07-31 ENCOUNTER — Encounter (HOSPITAL_COMMUNITY): Payer: Self-pay

## 2021-07-31 VITALS — BP 122/70 | HR 88 | Wt 156.0 lb

## 2021-07-31 DIAGNOSIS — R0602 Shortness of breath: Secondary | ICD-10-CM | POA: Diagnosis not present

## 2021-07-31 DIAGNOSIS — E119 Type 2 diabetes mellitus without complications: Secondary | ICD-10-CM

## 2021-07-31 DIAGNOSIS — I10 Essential (primary) hypertension: Secondary | ICD-10-CM | POA: Diagnosis not present

## 2021-07-31 DIAGNOSIS — R042 Hemoptysis: Secondary | ICD-10-CM | POA: Diagnosis not present

## 2021-07-31 DIAGNOSIS — I441 Atrioventricular block, second degree: Secondary | ICD-10-CM | POA: Diagnosis not present

## 2021-07-31 DIAGNOSIS — I2510 Atherosclerotic heart disease of native coronary artery without angina pectoris: Secondary | ICD-10-CM | POA: Insufficient documentation

## 2021-07-31 DIAGNOSIS — I5042 Chronic combined systolic (congestive) and diastolic (congestive) heart failure: Secondary | ICD-10-CM | POA: Diagnosis not present

## 2021-07-31 DIAGNOSIS — Z8616 Personal history of COVID-19: Secondary | ICD-10-CM | POA: Diagnosis not present

## 2021-07-31 DIAGNOSIS — I252 Old myocardial infarction: Secondary | ICD-10-CM | POA: Insufficient documentation

## 2021-07-31 DIAGNOSIS — I255 Ischemic cardiomyopathy: Secondary | ICD-10-CM | POA: Insufficient documentation

## 2021-07-31 DIAGNOSIS — I130 Hypertensive heart and chronic kidney disease with heart failure and stage 1 through stage 4 chronic kidney disease, or unspecified chronic kidney disease: Secondary | ICD-10-CM | POA: Diagnosis not present

## 2021-07-31 DIAGNOSIS — M6281 Muscle weakness (generalized): Secondary | ICD-10-CM | POA: Diagnosis not present

## 2021-07-31 DIAGNOSIS — Z9981 Dependence on supplemental oxygen: Secondary | ICD-10-CM | POA: Diagnosis not present

## 2021-07-31 DIAGNOSIS — Z7984 Long term (current) use of oral hypoglycemic drugs: Secondary | ICD-10-CM | POA: Insufficient documentation

## 2021-07-31 DIAGNOSIS — N1831 Chronic kidney disease, stage 3a: Secondary | ICD-10-CM | POA: Insufficient documentation

## 2021-07-31 DIAGNOSIS — I083 Combined rheumatic disorders of mitral, aortic and tricuspid valves: Secondary | ICD-10-CM | POA: Diagnosis not present

## 2021-07-31 DIAGNOSIS — Z7901 Long term (current) use of anticoagulants: Secondary | ICD-10-CM | POA: Insufficient documentation

## 2021-07-31 DIAGNOSIS — R040 Epistaxis: Secondary | ICD-10-CM | POA: Insufficient documentation

## 2021-07-31 DIAGNOSIS — I38 Endocarditis, valve unspecified: Secondary | ICD-10-CM

## 2021-07-31 DIAGNOSIS — R5381 Other malaise: Secondary | ICD-10-CM

## 2021-07-31 DIAGNOSIS — G9340 Encephalopathy, unspecified: Secondary | ICD-10-CM | POA: Diagnosis not present

## 2021-07-31 DIAGNOSIS — N183 Chronic kidney disease, stage 3 unspecified: Secondary | ICD-10-CM

## 2021-07-31 DIAGNOSIS — E1122 Type 2 diabetes mellitus with diabetic chronic kidney disease: Secondary | ICD-10-CM | POA: Insufficient documentation

## 2021-07-31 DIAGNOSIS — U099 Post covid-19 condition, unspecified: Secondary | ICD-10-CM | POA: Diagnosis not present

## 2021-07-31 DIAGNOSIS — Z79899 Other long term (current) drug therapy: Secondary | ICD-10-CM | POA: Diagnosis not present

## 2021-07-31 DIAGNOSIS — I5032 Chronic diastolic (congestive) heart failure: Secondary | ICD-10-CM | POA: Insufficient documentation

## 2021-07-31 DIAGNOSIS — I4892 Unspecified atrial flutter: Secondary | ICD-10-CM | POA: Diagnosis not present

## 2021-07-31 DIAGNOSIS — Z955 Presence of coronary angioplasty implant and graft: Secondary | ICD-10-CM | POA: Insufficient documentation

## 2021-07-31 DIAGNOSIS — A419 Sepsis, unspecified organism: Secondary | ICD-10-CM | POA: Diagnosis not present

## 2021-07-31 DIAGNOSIS — I4891 Unspecified atrial fibrillation: Secondary | ICD-10-CM | POA: Diagnosis not present

## 2021-07-31 LAB — CBC
HCT: 30.5 % — ABNORMAL LOW (ref 39.0–52.0)
Hemoglobin: 9.7 g/dL — ABNORMAL LOW (ref 13.0–17.0)
MCH: 30.9 pg (ref 26.0–34.0)
MCHC: 31.8 g/dL (ref 30.0–36.0)
MCV: 97.1 fL (ref 80.0–100.0)
Platelets: 359 10*3/uL (ref 150–400)
RBC: 3.14 MIL/uL — ABNORMAL LOW (ref 4.22–5.81)
RDW: 19.9 % — ABNORMAL HIGH (ref 11.5–15.5)
WBC: 7.2 10*3/uL (ref 4.0–10.5)
nRBC: 0 % (ref 0.0–0.2)

## 2021-07-31 LAB — BASIC METABOLIC PANEL
Anion gap: 8 (ref 5–15)
BUN: 36 mg/dL — ABNORMAL HIGH (ref 8–23)
CO2: 28 mmol/L (ref 22–32)
Calcium: 9 mg/dL (ref 8.9–10.3)
Chloride: 103 mmol/L (ref 98–111)
Creatinine, Ser: 2 mg/dL — ABNORMAL HIGH (ref 0.61–1.24)
GFR, Estimated: 32 mL/min — ABNORMAL LOW (ref >60)
Glucose, Bld: 99 mg/dL (ref 70–99)
Potassium: 4.3 mmol/L (ref 3.5–5.1)
Sodium: 139 mmol/L (ref 135–145)

## 2021-07-31 LAB — BRAIN NATRIURETIC PEPTIDE: B Natriuretic Peptide: 1114.5 pg/mL — ABNORMAL HIGH (ref 0.0–100.0)

## 2021-07-31 MED ORDER — TORSEMIDE 20 MG PO TABS: 60.0000 mg | ORAL_TABLET | Freq: Every day | ORAL | 3 refills | Status: DC

## 2021-07-31 NOTE — Progress Notes (Signed)
ReDS Vest / Clip - 07/31/21 1200   ? ?  ? ReDS Vest / Clip  ? Station Marker C   ? Ruler Value 28.5   ? ReDS Value Range High volume overload   ? ReDS Actual Value 41   ? ?  ?  ? ?  ? ? ?

## 2021-07-31 NOTE — Patient Instructions (Signed)
Following order sent back to [facility]: ? ?Increase Torsemide to 40 mg Twice daily for 2 days only ?Then take Torsemide 60 mg daily ?Labs done today in our office (bmet, cbc, bnp) ?Labs needed to be drawn in 10-14 days (bmp) fax results to [phone] ?Keep follow up as scheduled 09/12/21 ? ? ?

## 2021-08-01 DIAGNOSIS — U099 Post covid-19 condition, unspecified: Secondary | ICD-10-CM | POA: Diagnosis not present

## 2021-08-01 DIAGNOSIS — I5042 Chronic combined systolic (congestive) and diastolic (congestive) heart failure: Secondary | ICD-10-CM | POA: Diagnosis not present

## 2021-08-01 DIAGNOSIS — E119 Type 2 diabetes mellitus without complications: Secondary | ICD-10-CM | POA: Diagnosis not present

## 2021-08-01 DIAGNOSIS — M6281 Muscle weakness (generalized): Secondary | ICD-10-CM | POA: Diagnosis not present

## 2021-08-01 DIAGNOSIS — G9340 Encephalopathy, unspecified: Secondary | ICD-10-CM | POA: Diagnosis not present

## 2021-08-01 DIAGNOSIS — I4891 Unspecified atrial fibrillation: Secondary | ICD-10-CM | POA: Diagnosis not present

## 2021-08-01 DIAGNOSIS — R0602 Shortness of breath: Secondary | ICD-10-CM | POA: Diagnosis not present

## 2021-08-01 DIAGNOSIS — R042 Hemoptysis: Secondary | ICD-10-CM | POA: Diagnosis not present

## 2021-08-01 DIAGNOSIS — A419 Sepsis, unspecified organism: Secondary | ICD-10-CM | POA: Diagnosis not present

## 2021-08-05 DIAGNOSIS — I5042 Chronic combined systolic (congestive) and diastolic (congestive) heart failure: Secondary | ICD-10-CM | POA: Diagnosis not present

## 2021-08-05 DIAGNOSIS — I4891 Unspecified atrial fibrillation: Secondary | ICD-10-CM | POA: Diagnosis not present

## 2021-08-05 DIAGNOSIS — G9340 Encephalopathy, unspecified: Secondary | ICD-10-CM | POA: Diagnosis not present

## 2021-08-05 DIAGNOSIS — E119 Type 2 diabetes mellitus without complications: Secondary | ICD-10-CM | POA: Diagnosis not present

## 2021-08-05 DIAGNOSIS — M6281 Muscle weakness (generalized): Secondary | ICD-10-CM | POA: Diagnosis not present

## 2021-08-05 DIAGNOSIS — U099 Post covid-19 condition, unspecified: Secondary | ICD-10-CM | POA: Diagnosis not present

## 2021-08-05 DIAGNOSIS — R042 Hemoptysis: Secondary | ICD-10-CM | POA: Diagnosis not present

## 2021-08-05 DIAGNOSIS — R0602 Shortness of breath: Secondary | ICD-10-CM | POA: Diagnosis not present

## 2021-08-05 DIAGNOSIS — A419 Sepsis, unspecified organism: Secondary | ICD-10-CM | POA: Diagnosis not present

## 2021-08-06 DIAGNOSIS — L988 Other specified disorders of the skin and subcutaneous tissue: Secondary | ICD-10-CM | POA: Diagnosis not present

## 2021-08-07 DIAGNOSIS — E119 Type 2 diabetes mellitus without complications: Secondary | ICD-10-CM | POA: Diagnosis not present

## 2021-08-07 DIAGNOSIS — R042 Hemoptysis: Secondary | ICD-10-CM | POA: Diagnosis not present

## 2021-08-07 DIAGNOSIS — M6281 Muscle weakness (generalized): Secondary | ICD-10-CM | POA: Diagnosis not present

## 2021-08-07 DIAGNOSIS — I4891 Unspecified atrial fibrillation: Secondary | ICD-10-CM | POA: Diagnosis not present

## 2021-08-07 DIAGNOSIS — A419 Sepsis, unspecified organism: Secondary | ICD-10-CM | POA: Diagnosis not present

## 2021-08-07 DIAGNOSIS — I5042 Chronic combined systolic (congestive) and diastolic (congestive) heart failure: Secondary | ICD-10-CM | POA: Diagnosis not present

## 2021-08-07 DIAGNOSIS — R0602 Shortness of breath: Secondary | ICD-10-CM | POA: Diagnosis not present

## 2021-08-07 DIAGNOSIS — U099 Post covid-19 condition, unspecified: Secondary | ICD-10-CM | POA: Diagnosis not present

## 2021-08-07 DIAGNOSIS — G9340 Encephalopathy, unspecified: Secondary | ICD-10-CM | POA: Diagnosis not present

## 2021-08-08 DIAGNOSIS — R042 Hemoptysis: Secondary | ICD-10-CM | POA: Diagnosis not present

## 2021-08-08 DIAGNOSIS — U099 Post covid-19 condition, unspecified: Secondary | ICD-10-CM | POA: Diagnosis not present

## 2021-08-08 DIAGNOSIS — M6281 Muscle weakness (generalized): Secondary | ICD-10-CM | POA: Diagnosis not present

## 2021-08-08 DIAGNOSIS — I4891 Unspecified atrial fibrillation: Secondary | ICD-10-CM | POA: Diagnosis not present

## 2021-08-08 DIAGNOSIS — E119 Type 2 diabetes mellitus without complications: Secondary | ICD-10-CM | POA: Diagnosis not present

## 2021-08-08 DIAGNOSIS — R0602 Shortness of breath: Secondary | ICD-10-CM | POA: Diagnosis not present

## 2021-08-08 DIAGNOSIS — G9340 Encephalopathy, unspecified: Secondary | ICD-10-CM | POA: Diagnosis not present

## 2021-08-08 DIAGNOSIS — A419 Sepsis, unspecified organism: Secondary | ICD-10-CM | POA: Diagnosis not present

## 2021-08-08 DIAGNOSIS — I5042 Chronic combined systolic (congestive) and diastolic (congestive) heart failure: Secondary | ICD-10-CM | POA: Diagnosis not present

## 2021-08-12 DIAGNOSIS — E119 Type 2 diabetes mellitus without complications: Secondary | ICD-10-CM | POA: Diagnosis not present

## 2021-08-12 DIAGNOSIS — E785 Hyperlipidemia, unspecified: Secondary | ICD-10-CM | POA: Diagnosis not present

## 2021-08-13 DIAGNOSIS — I4891 Unspecified atrial fibrillation: Secondary | ICD-10-CM | POA: Diagnosis not present

## 2021-08-13 DIAGNOSIS — E119 Type 2 diabetes mellitus without complications: Secondary | ICD-10-CM | POA: Diagnosis not present

## 2021-08-13 DIAGNOSIS — R042 Hemoptysis: Secondary | ICD-10-CM | POA: Diagnosis not present

## 2021-08-13 DIAGNOSIS — A419 Sepsis, unspecified organism: Secondary | ICD-10-CM | POA: Diagnosis not present

## 2021-08-13 DIAGNOSIS — M6281 Muscle weakness (generalized): Secondary | ICD-10-CM | POA: Diagnosis not present

## 2021-08-13 DIAGNOSIS — G9340 Encephalopathy, unspecified: Secondary | ICD-10-CM | POA: Diagnosis not present

## 2021-08-13 DIAGNOSIS — U099 Post covid-19 condition, unspecified: Secondary | ICD-10-CM | POA: Diagnosis not present

## 2021-08-13 DIAGNOSIS — R0602 Shortness of breath: Secondary | ICD-10-CM | POA: Diagnosis not present

## 2021-08-13 DIAGNOSIS — I5042 Chronic combined systolic (congestive) and diastolic (congestive) heart failure: Secondary | ICD-10-CM | POA: Diagnosis not present

## 2021-08-13 DIAGNOSIS — L988 Other specified disorders of the skin and subcutaneous tissue: Secondary | ICD-10-CM | POA: Diagnosis not present

## 2021-08-14 DIAGNOSIS — N178 Other acute kidney failure: Secondary | ICD-10-CM | POA: Diagnosis not present

## 2021-08-14 DIAGNOSIS — I4891 Unspecified atrial fibrillation: Secondary | ICD-10-CM | POA: Diagnosis not present

## 2021-08-14 DIAGNOSIS — A419 Sepsis, unspecified organism: Secondary | ICD-10-CM | POA: Diagnosis not present

## 2021-08-14 DIAGNOSIS — G9340 Encephalopathy, unspecified: Secondary | ICD-10-CM | POA: Diagnosis not present

## 2021-08-14 DIAGNOSIS — U099 Post covid-19 condition, unspecified: Secondary | ICD-10-CM | POA: Diagnosis not present

## 2021-08-14 DIAGNOSIS — R0602 Shortness of breath: Secondary | ICD-10-CM | POA: Diagnosis not present

## 2021-08-14 DIAGNOSIS — R042 Hemoptysis: Secondary | ICD-10-CM | POA: Diagnosis not present

## 2021-08-14 DIAGNOSIS — M6281 Muscle weakness (generalized): Secondary | ICD-10-CM | POA: Diagnosis not present

## 2021-08-14 DIAGNOSIS — R040 Epistaxis: Secondary | ICD-10-CM | POA: Diagnosis not present

## 2021-08-14 DIAGNOSIS — J9601 Acute respiratory failure with hypoxia: Secondary | ICD-10-CM | POA: Diagnosis not present

## 2021-08-14 DIAGNOSIS — I5042 Chronic combined systolic (congestive) and diastolic (congestive) heart failure: Secondary | ICD-10-CM | POA: Diagnosis not present

## 2021-08-14 DIAGNOSIS — E119 Type 2 diabetes mellitus without complications: Secondary | ICD-10-CM | POA: Diagnosis not present

## 2021-08-19 DIAGNOSIS — I4892 Unspecified atrial flutter: Secondary | ICD-10-CM | POA: Diagnosis not present

## 2021-08-19 DIAGNOSIS — E1159 Type 2 diabetes mellitus with other circulatory complications: Secondary | ICD-10-CM | POA: Diagnosis not present

## 2021-08-19 DIAGNOSIS — J9601 Acute respiratory failure with hypoxia: Secondary | ICD-10-CM | POA: Diagnosis not present

## 2021-08-19 DIAGNOSIS — I509 Heart failure, unspecified: Secondary | ICD-10-CM | POA: Diagnosis not present

## 2021-08-19 DIAGNOSIS — I340 Nonrheumatic mitral (valve) insufficiency: Secondary | ICD-10-CM | POA: Diagnosis not present

## 2021-08-19 DIAGNOSIS — N183 Chronic kidney disease, stage 3 unspecified: Secondary | ICD-10-CM | POA: Diagnosis not present

## 2021-08-20 DIAGNOSIS — L988 Other specified disorders of the skin and subcutaneous tissue: Secondary | ICD-10-CM | POA: Diagnosis not present

## 2021-08-26 ENCOUNTER — Encounter (HOSPITAL_COMMUNITY): Payer: Self-pay

## 2021-08-26 ENCOUNTER — Ambulatory Visit (HOSPITAL_COMMUNITY)
Admission: RE | Admit: 2021-08-26 | Discharge: 2021-08-26 | Disposition: A | Discharge status: Home / Self Care | Payer: Medicare HMO | Source: Ambulatory Visit | Attending: Internal Medicine | Admitting: Internal Medicine

## 2021-08-26 VITALS — BP 116/72 | HR 77 | Wt 139.8 lb

## 2021-08-26 DIAGNOSIS — R040 Epistaxis: Secondary | ICD-10-CM | POA: Diagnosis not present

## 2021-08-26 DIAGNOSIS — I255 Ischemic cardiomyopathy: Secondary | ICD-10-CM | POA: Insufficient documentation

## 2021-08-26 DIAGNOSIS — E1122 Type 2 diabetes mellitus with diabetic chronic kidney disease: Secondary | ICD-10-CM | POA: Diagnosis not present

## 2021-08-26 DIAGNOSIS — I5032 Chronic diastolic (congestive) heart failure: Secondary | ICD-10-CM | POA: Diagnosis not present

## 2021-08-26 DIAGNOSIS — I130 Hypertensive heart and chronic kidney disease with heart failure and stage 1 through stage 4 chronic kidney disease, or unspecified chronic kidney disease: Secondary | ICD-10-CM | POA: Diagnosis not present

## 2021-08-26 DIAGNOSIS — Z7901 Long term (current) use of anticoagulants: Secondary | ICD-10-CM | POA: Insufficient documentation

## 2021-08-26 DIAGNOSIS — N1831 Chronic kidney disease, stage 3a: Secondary | ICD-10-CM | POA: Diagnosis not present

## 2021-08-26 DIAGNOSIS — I252 Old myocardial infarction: Secondary | ICD-10-CM | POA: Diagnosis not present

## 2021-08-26 DIAGNOSIS — R5381 Other malaise: Secondary | ICD-10-CM

## 2021-08-26 DIAGNOSIS — I4892 Unspecified atrial flutter: Secondary | ICD-10-CM | POA: Diagnosis not present

## 2021-08-26 DIAGNOSIS — Z7982 Long term (current) use of aspirin: Secondary | ICD-10-CM | POA: Diagnosis not present

## 2021-08-26 DIAGNOSIS — R54 Age-related physical debility: Secondary | ICD-10-CM | POA: Insufficient documentation

## 2021-08-26 DIAGNOSIS — I472 Ventricular tachycardia, unspecified: Secondary | ICD-10-CM | POA: Diagnosis not present

## 2021-08-26 DIAGNOSIS — I38 Endocarditis, valve unspecified: Secondary | ICD-10-CM

## 2021-08-26 DIAGNOSIS — N183 Chronic kidney disease, stage 3 unspecified: Secondary | ICD-10-CM | POA: Diagnosis not present

## 2021-08-26 DIAGNOSIS — I2510 Atherosclerotic heart disease of native coronary artery without angina pectoris: Secondary | ICD-10-CM | POA: Insufficient documentation

## 2021-08-26 DIAGNOSIS — Z955 Presence of coronary angioplasty implant and graft: Secondary | ICD-10-CM | POA: Diagnosis not present

## 2021-08-26 DIAGNOSIS — I082 Rheumatic disorders of both aortic and tricuspid valves: Secondary | ICD-10-CM | POA: Diagnosis not present

## 2021-08-26 DIAGNOSIS — E119 Type 2 diabetes mellitus without complications: Secondary | ICD-10-CM

## 2021-08-26 DIAGNOSIS — I10 Essential (primary) hypertension: Secondary | ICD-10-CM

## 2021-08-26 DIAGNOSIS — J9601 Acute respiratory failure with hypoxia: Secondary | ICD-10-CM | POA: Diagnosis not present

## 2021-08-26 DIAGNOSIS — E1151 Type 2 diabetes mellitus with diabetic peripheral angiopathy without gangrene: Secondary | ICD-10-CM | POA: Diagnosis not present

## 2021-08-26 DIAGNOSIS — R2689 Other abnormalities of gait and mobility: Secondary | ICD-10-CM | POA: Diagnosis not present

## 2021-08-26 DIAGNOSIS — Z7984 Long term (current) use of oral hypoglycemic drugs: Secondary | ICD-10-CM | POA: Insufficient documentation

## 2021-08-26 DIAGNOSIS — I441 Atrioventricular block, second degree: Secondary | ICD-10-CM

## 2021-08-26 DIAGNOSIS — Z79899 Other long term (current) drug therapy: Secondary | ICD-10-CM | POA: Diagnosis not present

## 2021-08-26 DIAGNOSIS — R41841 Cognitive communication deficit: Secondary | ICD-10-CM | POA: Diagnosis not present

## 2021-08-26 DIAGNOSIS — I3139 Other pericardial effusion (noninflammatory): Secondary | ICD-10-CM | POA: Insufficient documentation

## 2021-08-26 DIAGNOSIS — J1282 Pneumonia due to coronavirus disease 2019: Secondary | ICD-10-CM | POA: Diagnosis not present

## 2021-08-26 DIAGNOSIS — M6281 Muscle weakness (generalized): Secondary | ICD-10-CM | POA: Diagnosis not present

## 2021-08-26 DIAGNOSIS — R262 Difficulty in walking, not elsewhere classified: Secondary | ICD-10-CM | POA: Diagnosis not present

## 2021-08-26 DIAGNOSIS — R1312 Dysphagia, oropharyngeal phase: Secondary | ICD-10-CM | POA: Diagnosis not present

## 2021-08-26 DIAGNOSIS — I5043 Acute on chronic combined systolic (congestive) and diastolic (congestive) heart failure: Secondary | ICD-10-CM | POA: Diagnosis not present

## 2021-08-26 LAB — BRAIN NATRIURETIC PEPTIDE: B Natriuretic Peptide: 543.3 pg/mL — ABNORMAL HIGH (ref 0.0–100.0)

## 2021-08-26 LAB — BASIC METABOLIC PANEL
Anion gap: 13 (ref 5–15)
BUN: 58 mg/dL — ABNORMAL HIGH (ref 8–23)
CO2: 27 mmol/L (ref 22–32)
Calcium: 9.6 mg/dL (ref 8.9–10.3)
Chloride: 99 mmol/L (ref 98–111)
Creatinine, Ser: 2.07 mg/dL — ABNORMAL HIGH (ref 0.61–1.24)
GFR, Estimated: 30 mL/min — ABNORMAL LOW (ref >60)
Glucose, Bld: 79 mg/dL (ref 70–99)
Potassium: 4.5 mmol/L (ref 3.5–5.1)
Sodium: 139 mmol/L (ref 135–145)

## 2021-08-26 NOTE — Patient Instructions (Signed)
Thank you for coming in today ? ?Labs were done today, if any labs are abnormal the clinic will call you ?No news is good news ? ?Your physician recommends that you schedule a follow-up appointment in:  ?Please keep follow up with Dr. Haroldine Laws ? ?At the [facility], you and your health needs are our priority. As part of our continuing mission to provide you with exceptional heart care, we have created designated Provider Care Teams. These Care Teams include your primary Cardiologist (physician) and Advanced Practice Providers (APPs- Physician Assistants and Nurse Practitioners) who all work together to provide you with the care you need, when you need it.  ? ?You may see any of the following providers on your designated Care Team at your next follow up: ?Dr Glori Bickers ?Dr Loralie Champagne ?Darrick Grinder, NP ?Lyda Jester, PA ?Jessica Milford,NP ?Marlyce Huge, PA ?Audry Riles, PharmD ? ? ?Please be sure to bring in all your medications bottles to every appointment.  ? ? ?If you have any questions or concerns before your next appointment please send [place] a message through [place] or call our office at [phone].   ? ?TO LEAVE A MESSAGE FOR THE NURSE SELECT OPTION 2, PLEASE LEAVE A MESSAGE INCLUDING: ?YOUR NAME ?DATE OF BIRTH ?CALL BACK NUMBER ?REASON FOR CALL**this is important as we prioritize the call backs ? ?YOU WILL RECEIVE A CALL BACK THE SAME DAY AS LONG AS YOU CALL BEFORE 4:00 PM ? ?

## 2021-08-26 NOTE — Progress Notes (Signed)
? ?Advanced Heart Failure Clinic Note ? ?Patient ID: Kevin Schwartz, male   DOB: 1930/08/04, 88 y.o.   MRN: [id number] ? ?DSK:AJGOTL, [name], MD ?HF Cardioligst: Dr. Haroldine Laws ? ?HPI: ?Kevin Schwartz is an 88 y.o.male with  DM2, HTN, HL, mild renal insufficiency, RAS s/p stenting of L renal artery in 2008, CAD s/p multiple PCIs. ? ?Seen 2018 in AHF clinic with stable class II symptoms. ? ?Admitted 07/31/2020 with left shoulder pain.  EKG showed no obvious ST-T wave changes however does reveal Mobitz 1 heart block, beta-blocker was stopped.  He was ruled in for NSTEMI.  Echo showed EF 45 to 50%, hypokinesis of the LV basal mid anteroseptal wall, RVSP 28.2 mmHg, mild MR, mild AI, small pericardial effusion.  Cath with multivessel CAD with 85% proximal RCA, 85% mid RCA, 90% distal RCA, 80% mid LAD, 80% OM 2 lesion.  Started on aspirin and Plavix.  Patient returned to the Cath Lab the next day for staged PCI with DES to the mid LAD, balloon angioplasty of left circumflex lesion, the diffusely diseased RCA was treated medically. ? ?Put on a 14-day heart monitor:  ? ?1.  Predominant underlying rhythm was sinus rhythm with 1AVBP -  avg HR of 75 bpm. ?2.  Atrial Flutter occurred (39% burden), ranging from 35-143 bpm (avg of 68 bpm), the longest lasting 5 days 10 hours with an avg rate of 68 bpm. ?3. Five episodes of NSVT - longest was 10 beats ?4. Transient second Degree AV  Block-Mobitz I (Wenckebach) was present. ?5. Transient junctional rhythm was present with rates in mid 40s ?6. Rare PACs and PVCs ? ?Lost to follow up until Follow up 7/22 to reestablish care, stable NYHA II symptoms, Farxiga 5 started and referred to ENT for epistaxis. ? ?Admitted 1/23 with COVID pneumonia, complicated by AKI, felt to be 2/2 to ATN from [place]. Renal u/s negative for hydronephrosis. SCr stabilized with holding Lasix, eventually restarted on home dose. Echo showed EF 45 to 50% with global HK of LV, RV normal.  Moderate to severe MR  noted (previously mild). Nephrology arranged outpatient follow up. ? ?Admitted 3/23 with a/c CHF. Diuresed with IV lasix. Echo showed EF 45-50%, LV mildly down with global LV HK, mild LVH, RV mildly reduced, severely elevated PASP 67.4 mmHg, moderate to severe MR, severe TR, mild to moderate AI. Also with epistaxis requiring anterior nasal packing. EP consulted due to Mobitz Type 1 with 3.4 second pauses. Recommended conservative management with no nodal blocking agents. Discharged back to SNF, weight 159.5 lbs.  ? ?Seen in ED 07/22/21 with fall, hit head. CT head negative. Follow up 4/23, volume up, REDs  41%. Torsemide increased to 40 bid x 2 days, then 60 mg daily.  ? ?Today he returns for HF follow up with his son. Overall feeling fine. He remains at [facility]. 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 Son says he has not been able to do that in months. Continues to work with PT at [employer] [facility]. Wears 3L oxygen. Denies palpitations, CP, dizziness, edema, or PND/Orthopnea. Appetite great, says he eats a lot. No fever or chills. Taking all medications provided at facility.  ? ?Past Medical History:  ?Diagnosis Date  ? Anemia due to chronic kidney disease   ? Bilateral inguinal hernia   ? CAD (coronary artery disease) cardiologist-  dr bensimhon  ? PTCA of OM in 1998, BMS  OM in 2000, Cath 9/07 LM ok LAD ok. LCX 95% in OM prior to previous stent RCA. nondominant normal EF normal. Cypher DES to OM 2007 (PLACED PROXIAMAL TO PREVIOUS STENT)  ? Chronic kidney disease, stage III (moderate) (HCC)   ? nephrologist-  dr detarding  ? Depression   ? Diabetes mellitus type 2, diet-controlled ([place])   ? followed by pcp,  last A1c 5.2 on 09-10-2017 in epic  ? Gout, unspecified   ? 10-29-2017  per pt stable  ? HLD (hyperlipidemia)   ? HTN (hypertension)   ? MGUS (monoclonal gammopathy of unknown significance) previously followed by dr Beryle Beams , [place] [facility] and released  08/01/2011  ? IgG kappa dx 2002 8% plasma cells in bone marrow; no lesions on bone X-rays;  ? Nocturia   ? OA (osteoarthritis)   ? OSA on CPAP   ? per study 01-30-2004  severe osa , AHI 51.6/hr  ? PAD (peripheral artery disease) ([place])   ? 05-21-2006  left renal artery stenosis, s/p balloon angioplasty and stenting;  last duplex 02/ 2012  normal , arteries patent  ? S/P coronary artery stent placement   ? 2000--  BMS x1  to OM;   2007-- DES x1  to OM proximal to previous stent  ? Secondary hyperparathyroidism of renal origin [facility])   ? Urgency of urination   ? Wears glasses   ? ? ?Current Outpatient Medications  ?Medication Sig Dispense Refill  ? ACCU-CHEK SOFTCLIX LANCETS lancets Use to test blood sugar daily. Dx:E11.8 300 each 3  ? acetaminophen (TYLENOL) 500 MG tablet Take 1,000 mg by mouth every 8 (eight) hours as needed for moderate pain.    ? Alcohol Swabs (B-D SINGLE USE SWABS REGULAR) PADS Use with testing of blood sugar. Dx:E11.8 300 each 3  ? allopurinol (ZYLOPRIM) 100 MG tablet Take 1 tablet (100 mg total) by mouth daily. 90 tablet 3  ? apixaban (ELIQUIS) 2.5 MG TABS tablet Take 1 tablet (2.5 mg total) by mouth 2 (two) times daily. 60 tablet   ? aspirin EC 81 MG tablet Take 81 mg by mouth daily. Swallow whole.    ? atorvastatin (LIPITOR) 80 MG tablet Take 1 tablet (80 mg total) by mouth daily. (Patient taking differently: Take 80 mg by mouth at bedtime.) 90 tablet 3  ? cetirizine (ZYRTEC) 10 MG tablet Take 10 mg by mouth at bedtime.    ? citalopram (CELEXA) 20 MG tablet Take 1 tablet (20 mg total) by mouth daily. 90 tablet 3  ? diphenhydrAMINE (BENADRYL) 25 MG tablet Take 25 mg by mouth 2 (two) times daily as needed for itching.    ? Emollient (EUCERIN) lotion Apply 1 application. topically daily. Apply to rash on  chest, abdomen, back,neck and shoulders    ? empagliflozin (JARDIANCE) 10 MG TABS tablet Take 1 tablet (10 mg total) by mouth daily. 30 tablet   ? ezetimibe (ZETIA) 10 MG tablet Take 10 mg by  mouth daily.    ? glucose blood (ACCU-CHEK AVIVA PLUS) test strip Use to check blood sugar daily. Dx:E11.8 300 each 3  ? guaiFENesin (MUCINEX) 600 MG 12 hr tablet Take 600 mg by mouth 2 (two) times daily.    ? ipratropium-albuterol (DUONEB) 0.5-2.5 (3) MG/3ML SOLN Inhale 3 mLs into the lungs 3 (three) times daily.    ? levothyroxine (SYNTHROID) 50 MCG tablet Patient takes 1/2 tablet by mouth before breakfast with water only.    ? lidocaine (LIDODERM) 5 % Place 1 patch onto  the skin daily. Remove & Discard patch within 12 hours or as directed by MD (Patient taking differently: Place 1 patch onto the skin See admin instructions. Apply to left knee in the morning,remove at bedtime) 5 patch 0  ? nitroGLYCERIN (NITROSTAT) 0.4 MG SL tablet DISSOLVE 1 TABLET UNDER THE TONGUE EVERY 5 MINUTES AS  NEEDED FOR CHEST PAIN. MAX  OF 3 TABLETS IN 15 MINUTES. CALL 911 IF PAIN PERSISTS. (Patient taking differently: Place 0.4 mg under the tongue every 5 (five) minutes as needed for chest pain.) 75 tablet 1  ? tamsulosin (FLOMAX) 0.4 MG CAPS capsule Take 0.4 mg by mouth daily.    ? torsemide (DEMADEX) 20 MG tablet Take 3 tablets (60 mg total) by mouth daily.  3  ? white petrolatum (VASELINE) GEL Apply 1 application. topically 3 (three) times daily as needed (inside bilateral nares).    ? Zinc Oxide 25 % PSTE Apply 1 application. topically in the morning and at bedtime. Apply to bottom during incontinence care    ? ?No current facility-administered medications for this encounter.  ? ?Social History  ? ?Tobacco Use  ? Smoking status: Some Days  ?  Packs/day: 0.25  ?  Types: Cigarettes  ? Smokeless tobacco: Never  ? Tobacco comments:  ?  10-29-2017  per pt was smoking 1pp2d, stopped June 2019  ?Vaping Use  ? Vaping Use: Never used  ?Substance Use Topics  ? Alcohol use: Not Currently  ? Drug use: No  ? ?Family History  ?Problem Relation Age of Onset  ? Heart disease Mother   ? Healthy Father   ? Heart disease Sister   ? Heart disease  Sister   ? Cancer Sister   ?     type unknown  ? Hypertension Sister   ? Diabetes Brother   ? Diabetes Brother   ? Diabetes Other   ? Coronary artery disease Other   ? Cancer Other   ? ?Wt Readings from Last 3 Encoun

## 2021-08-26 NOTE — Progress Notes (Signed)
ReDS Vest / Clip - 08/26/21 1400   ? ?  ? ReDS Vest / Clip  ? Station Marker C   ? Ruler Value 27   ? ReDS Value Range Low volume   ? ReDS Actual Value 26   ? Anatomical Comments sitting   ? ?  ?  ? ?  ? ? ?

## 2021-08-27 DIAGNOSIS — R1312 Dysphagia, oropharyngeal phase: Secondary | ICD-10-CM | POA: Diagnosis not present

## 2021-08-27 DIAGNOSIS — M6281 Muscle weakness (generalized): Secondary | ICD-10-CM | POA: Diagnosis not present

## 2021-08-27 DIAGNOSIS — I5043 Acute on chronic combined systolic (congestive) and diastolic (congestive) heart failure: Secondary | ICD-10-CM | POA: Diagnosis not present

## 2021-08-27 DIAGNOSIS — R41841 Cognitive communication deficit: Secondary | ICD-10-CM | POA: Diagnosis not present

## 2021-08-27 DIAGNOSIS — L988 Other specified disorders of the skin and subcutaneous tissue: Secondary | ICD-10-CM | POA: Diagnosis not present

## 2021-08-27 DIAGNOSIS — J9601 Acute respiratory failure with hypoxia: Secondary | ICD-10-CM | POA: Diagnosis not present

## 2021-08-27 DIAGNOSIS — R262 Difficulty in walking, not elsewhere classified: Secondary | ICD-10-CM | POA: Diagnosis not present

## 2021-08-27 DIAGNOSIS — R2689 Other abnormalities of gait and mobility: Secondary | ICD-10-CM | POA: Diagnosis not present

## 2021-08-28 ENCOUNTER — Other Ambulatory Visit (HOSPITAL_COMMUNITY): Payer: Self-pay | Admitting: Internal Medicine

## 2021-08-28 DIAGNOSIS — R262 Difficulty in walking, not elsewhere classified: Secondary | ICD-10-CM | POA: Diagnosis not present

## 2021-08-28 DIAGNOSIS — I5043 Acute on chronic combined systolic (congestive) and diastolic (congestive) heart failure: Secondary | ICD-10-CM | POA: Diagnosis not present

## 2021-08-28 DIAGNOSIS — M6281 Muscle weakness (generalized): Secondary | ICD-10-CM | POA: Diagnosis not present

## 2021-08-28 DIAGNOSIS — R1312 Dysphagia, oropharyngeal phase: Secondary | ICD-10-CM | POA: Diagnosis not present

## 2021-08-28 DIAGNOSIS — B029 Zoster without complications: Secondary | ICD-10-CM | POA: Diagnosis not present

## 2021-08-28 DIAGNOSIS — R2689 Other abnormalities of gait and mobility: Secondary | ICD-10-CM | POA: Diagnosis not present

## 2021-08-28 DIAGNOSIS — J9601 Acute respiratory failure with hypoxia: Secondary | ICD-10-CM | POA: Diagnosis not present

## 2021-08-28 DIAGNOSIS — R41841 Cognitive communication deficit: Secondary | ICD-10-CM | POA: Diagnosis not present

## 2021-08-28 DIAGNOSIS — N183 Chronic kidney disease, stage 3 unspecified: Secondary | ICD-10-CM | POA: Diagnosis not present

## 2021-08-29 DIAGNOSIS — I5043 Acute on chronic combined systolic (congestive) and diastolic (congestive) heart failure: Secondary | ICD-10-CM | POA: Diagnosis not present

## 2021-08-29 DIAGNOSIS — E119 Type 2 diabetes mellitus without complications: Secondary | ICD-10-CM | POA: Diagnosis not present

## 2021-08-29 DIAGNOSIS — R262 Difficulty in walking, not elsewhere classified: Secondary | ICD-10-CM | POA: Diagnosis not present

## 2021-08-29 DIAGNOSIS — R0602 Shortness of breath: Secondary | ICD-10-CM | POA: Diagnosis not present

## 2021-08-29 DIAGNOSIS — R042 Hemoptysis: Secondary | ICD-10-CM | POA: Diagnosis not present

## 2021-08-29 DIAGNOSIS — R1312 Dysphagia, oropharyngeal phase: Secondary | ICD-10-CM | POA: Diagnosis not present

## 2021-08-29 DIAGNOSIS — I4891 Unspecified atrial fibrillation: Secondary | ICD-10-CM | POA: Diagnosis not present

## 2021-08-29 DIAGNOSIS — R2689 Other abnormalities of gait and mobility: Secondary | ICD-10-CM | POA: Diagnosis not present

## 2021-08-29 DIAGNOSIS — J9601 Acute respiratory failure with hypoxia: Secondary | ICD-10-CM | POA: Diagnosis not present

## 2021-08-29 DIAGNOSIS — U099 Post covid-19 condition, unspecified: Secondary | ICD-10-CM | POA: Diagnosis not present

## 2021-08-29 DIAGNOSIS — G9340 Encephalopathy, unspecified: Secondary | ICD-10-CM | POA: Diagnosis not present

## 2021-08-29 DIAGNOSIS — R41841 Cognitive communication deficit: Secondary | ICD-10-CM | POA: Diagnosis not present

## 2021-08-29 DIAGNOSIS — I5042 Chronic combined systolic (congestive) and diastolic (congestive) heart failure: Secondary | ICD-10-CM | POA: Diagnosis not present

## 2021-08-29 DIAGNOSIS — A419 Sepsis, unspecified organism: Secondary | ICD-10-CM | POA: Diagnosis not present

## 2021-08-29 DIAGNOSIS — M6281 Muscle weakness (generalized): Secondary | ICD-10-CM | POA: Diagnosis not present

## 2021-08-30 DIAGNOSIS — J9601 Acute respiratory failure with hypoxia: Secondary | ICD-10-CM | POA: Diagnosis not present

## 2021-08-30 DIAGNOSIS — R262 Difficulty in walking, not elsewhere classified: Secondary | ICD-10-CM | POA: Diagnosis not present

## 2021-08-30 DIAGNOSIS — R41841 Cognitive communication deficit: Secondary | ICD-10-CM | POA: Diagnosis not present

## 2021-08-30 DIAGNOSIS — M6281 Muscle weakness (generalized): Secondary | ICD-10-CM | POA: Diagnosis not present

## 2021-08-30 DIAGNOSIS — R1312 Dysphagia, oropharyngeal phase: Secondary | ICD-10-CM | POA: Diagnosis not present

## 2021-08-30 DIAGNOSIS — I5043 Acute on chronic combined systolic (congestive) and diastolic (congestive) heart failure: Secondary | ICD-10-CM | POA: Diagnosis not present

## 2021-08-30 DIAGNOSIS — R2689 Other abnormalities of gait and mobility: Secondary | ICD-10-CM | POA: Diagnosis not present

## 2021-09-01 DIAGNOSIS — M6281 Muscle weakness (generalized): Secondary | ICD-10-CM | POA: Diagnosis not present

## 2021-09-01 DIAGNOSIS — R1312 Dysphagia, oropharyngeal phase: Secondary | ICD-10-CM | POA: Diagnosis not present

## 2021-09-01 DIAGNOSIS — J9601 Acute respiratory failure with hypoxia: Secondary | ICD-10-CM | POA: Diagnosis not present

## 2021-09-01 DIAGNOSIS — R262 Difficulty in walking, not elsewhere classified: Secondary | ICD-10-CM | POA: Diagnosis not present

## 2021-09-01 DIAGNOSIS — I5043 Acute on chronic combined systolic (congestive) and diastolic (congestive) heart failure: Secondary | ICD-10-CM | POA: Diagnosis not present

## 2021-09-01 DIAGNOSIS — R2689 Other abnormalities of gait and mobility: Secondary | ICD-10-CM | POA: Diagnosis not present

## 2021-09-01 DIAGNOSIS — R41841 Cognitive communication deficit: Secondary | ICD-10-CM | POA: Diagnosis not present

## 2021-09-02 DIAGNOSIS — R1312 Dysphagia, oropharyngeal phase: Secondary | ICD-10-CM | POA: Diagnosis not present

## 2021-09-02 DIAGNOSIS — M6281 Muscle weakness (generalized): Secondary | ICD-10-CM | POA: Diagnosis not present

## 2021-09-02 DIAGNOSIS — R41841 Cognitive communication deficit: Secondary | ICD-10-CM | POA: Diagnosis not present

## 2021-09-02 DIAGNOSIS — R2689 Other abnormalities of gait and mobility: Secondary | ICD-10-CM | POA: Diagnosis not present

## 2021-09-02 DIAGNOSIS — I5043 Acute on chronic combined systolic (congestive) and diastolic (congestive) heart failure: Secondary | ICD-10-CM | POA: Diagnosis not present

## 2021-09-02 DIAGNOSIS — R262 Difficulty in walking, not elsewhere classified: Secondary | ICD-10-CM | POA: Diagnosis not present

## 2021-09-02 DIAGNOSIS — J9601 Acute respiratory failure with hypoxia: Secondary | ICD-10-CM | POA: Diagnosis not present

## 2021-09-03 DIAGNOSIS — I5043 Acute on chronic combined systolic (congestive) and diastolic (congestive) heart failure: Secondary | ICD-10-CM | POA: Diagnosis not present

## 2021-09-03 DIAGNOSIS — M6281 Muscle weakness (generalized): Secondary | ICD-10-CM | POA: Diagnosis not present

## 2021-09-03 DIAGNOSIS — L988 Other specified disorders of the skin and subcutaneous tissue: Secondary | ICD-10-CM | POA: Diagnosis not present

## 2021-09-03 DIAGNOSIS — J9601 Acute respiratory failure with hypoxia: Secondary | ICD-10-CM | POA: Diagnosis not present

## 2021-09-03 DIAGNOSIS — R2689 Other abnormalities of gait and mobility: Secondary | ICD-10-CM | POA: Diagnosis not present

## 2021-09-03 DIAGNOSIS — E0590 Thyrotoxicosis, unspecified without thyrotoxic crisis or storm: Secondary | ICD-10-CM | POA: Diagnosis not present

## 2021-09-03 DIAGNOSIS — R1312 Dysphagia, oropharyngeal phase: Secondary | ICD-10-CM | POA: Diagnosis not present

## 2021-09-03 DIAGNOSIS — R262 Difficulty in walking, not elsewhere classified: Secondary | ICD-10-CM | POA: Diagnosis not present

## 2021-09-03 DIAGNOSIS — R41841 Cognitive communication deficit: Secondary | ICD-10-CM | POA: Diagnosis not present

## 2021-09-04 DIAGNOSIS — M6281 Muscle weakness (generalized): Secondary | ICD-10-CM | POA: Diagnosis not present

## 2021-09-04 DIAGNOSIS — G9340 Encephalopathy, unspecified: Secondary | ICD-10-CM | POA: Diagnosis not present

## 2021-09-04 DIAGNOSIS — I5042 Chronic combined systolic (congestive) and diastolic (congestive) heart failure: Secondary | ICD-10-CM | POA: Diagnosis not present

## 2021-09-04 DIAGNOSIS — R2689 Other abnormalities of gait and mobility: Secondary | ICD-10-CM | POA: Diagnosis not present

## 2021-09-04 DIAGNOSIS — R262 Difficulty in walking, not elsewhere classified: Secondary | ICD-10-CM | POA: Diagnosis not present

## 2021-09-04 DIAGNOSIS — A419 Sepsis, unspecified organism: Secondary | ICD-10-CM | POA: Diagnosis not present

## 2021-09-04 DIAGNOSIS — R41841 Cognitive communication deficit: Secondary | ICD-10-CM | POA: Diagnosis not present

## 2021-09-04 DIAGNOSIS — J9601 Acute respiratory failure with hypoxia: Secondary | ICD-10-CM | POA: Diagnosis not present

## 2021-09-04 DIAGNOSIS — E119 Type 2 diabetes mellitus without complications: Secondary | ICD-10-CM | POA: Diagnosis not present

## 2021-09-04 DIAGNOSIS — I4891 Unspecified atrial fibrillation: Secondary | ICD-10-CM | POA: Diagnosis not present

## 2021-09-04 DIAGNOSIS — R0602 Shortness of breath: Secondary | ICD-10-CM | POA: Diagnosis not present

## 2021-09-04 DIAGNOSIS — I5043 Acute on chronic combined systolic (congestive) and diastolic (congestive) heart failure: Secondary | ICD-10-CM | POA: Diagnosis not present

## 2021-09-04 DIAGNOSIS — U099 Post covid-19 condition, unspecified: Secondary | ICD-10-CM | POA: Diagnosis not present

## 2021-09-04 DIAGNOSIS — R1312 Dysphagia, oropharyngeal phase: Secondary | ICD-10-CM | POA: Diagnosis not present

## 2021-09-04 DIAGNOSIS — R042 Hemoptysis: Secondary | ICD-10-CM | POA: Diagnosis not present

## 2021-09-05 DIAGNOSIS — M6281 Muscle weakness (generalized): Secondary | ICD-10-CM | POA: Diagnosis not present

## 2021-09-05 DIAGNOSIS — I5043 Acute on chronic combined systolic (congestive) and diastolic (congestive) heart failure: Secondary | ICD-10-CM | POA: Diagnosis not present

## 2021-09-05 DIAGNOSIS — S91302D Unspecified open wound, left foot, subsequent encounter: Secondary | ICD-10-CM | POA: Diagnosis not present

## 2021-09-05 DIAGNOSIS — R41841 Cognitive communication deficit: Secondary | ICD-10-CM | POA: Diagnosis not present

## 2021-09-05 DIAGNOSIS — J9601 Acute respiratory failure with hypoxia: Secondary | ICD-10-CM | POA: Diagnosis not present

## 2021-09-05 DIAGNOSIS — R262 Difficulty in walking, not elsewhere classified: Secondary | ICD-10-CM | POA: Diagnosis not present

## 2021-09-05 DIAGNOSIS — R1312 Dysphagia, oropharyngeal phase: Secondary | ICD-10-CM | POA: Diagnosis not present

## 2021-09-05 DIAGNOSIS — R2689 Other abnormalities of gait and mobility: Secondary | ICD-10-CM | POA: Diagnosis not present

## 2021-09-06 DIAGNOSIS — R2689 Other abnormalities of gait and mobility: Secondary | ICD-10-CM | POA: Diagnosis not present

## 2021-09-06 DIAGNOSIS — R41841 Cognitive communication deficit: Secondary | ICD-10-CM | POA: Diagnosis not present

## 2021-09-06 DIAGNOSIS — R262 Difficulty in walking, not elsewhere classified: Secondary | ICD-10-CM | POA: Diagnosis not present

## 2021-09-06 DIAGNOSIS — I5043 Acute on chronic combined systolic (congestive) and diastolic (congestive) heart failure: Secondary | ICD-10-CM | POA: Diagnosis not present

## 2021-09-06 DIAGNOSIS — M6281 Muscle weakness (generalized): Secondary | ICD-10-CM | POA: Diagnosis not present

## 2021-09-06 DIAGNOSIS — J9601 Acute respiratory failure with hypoxia: Secondary | ICD-10-CM | POA: Diagnosis not present

## 2021-09-06 DIAGNOSIS — R1312 Dysphagia, oropharyngeal phase: Secondary | ICD-10-CM | POA: Diagnosis not present

## 2021-09-08 DIAGNOSIS — R262 Difficulty in walking, not elsewhere classified: Secondary | ICD-10-CM | POA: Diagnosis not present

## 2021-09-08 DIAGNOSIS — R41841 Cognitive communication deficit: Secondary | ICD-10-CM | POA: Diagnosis not present

## 2021-09-08 DIAGNOSIS — I5043 Acute on chronic combined systolic (congestive) and diastolic (congestive) heart failure: Secondary | ICD-10-CM | POA: Diagnosis not present

## 2021-09-08 DIAGNOSIS — R2689 Other abnormalities of gait and mobility: Secondary | ICD-10-CM | POA: Diagnosis not present

## 2021-09-08 DIAGNOSIS — J9601 Acute respiratory failure with hypoxia: Secondary | ICD-10-CM | POA: Diagnosis not present

## 2021-09-08 DIAGNOSIS — R1312 Dysphagia, oropharyngeal phase: Secondary | ICD-10-CM | POA: Diagnosis not present

## 2021-09-08 DIAGNOSIS — M6281 Muscle weakness (generalized): Secondary | ICD-10-CM | POA: Diagnosis not present

## 2021-09-09 DIAGNOSIS — R262 Difficulty in walking, not elsewhere classified: Secondary | ICD-10-CM | POA: Diagnosis not present

## 2021-09-09 DIAGNOSIS — M6281 Muscle weakness (generalized): Secondary | ICD-10-CM | POA: Diagnosis not present

## 2021-09-09 DIAGNOSIS — I5043 Acute on chronic combined systolic (congestive) and diastolic (congestive) heart failure: Secondary | ICD-10-CM | POA: Diagnosis not present

## 2021-09-09 DIAGNOSIS — R2689 Other abnormalities of gait and mobility: Secondary | ICD-10-CM | POA: Diagnosis not present

## 2021-09-09 DIAGNOSIS — R1312 Dysphagia, oropharyngeal phase: Secondary | ICD-10-CM | POA: Diagnosis not present

## 2021-09-09 DIAGNOSIS — R41841 Cognitive communication deficit: Secondary | ICD-10-CM | POA: Diagnosis not present

## 2021-09-09 DIAGNOSIS — J9601 Acute respiratory failure with hypoxia: Secondary | ICD-10-CM | POA: Diagnosis not present

## 2021-09-09 NOTE — Progress Notes (Incomplete)
Advanced Heart Failure Clinic Note  Patient ID: Kevin Schwartz, male   DOB: March 10, 1931, 88 y.o.   MRN: [id number]  TMA:[id number], [name], MD HF Cardioligst: Dr. Haroldine Laws  HPI: Kevin Schwartz is an 88 y.o.male with  DM2, HTN, HL, mild renal insufficiency, RAS s/p stenting of L renal artery in 2008, CAD s/p multiple PCIs.  Admitted 4/22 with left shoulder pain -> NSTEMI. Echo EF 45-50%, HK of basal mid anteroseptal wall. Cath  multivessel CAD with 85% proximal RCA, 85% mid RCA, 90% distal RCA, 80% mid LAD, 80% OM 2 lesion. Staged PCI with DES to the mid LAD, balloon angioplasty of left circumflex lesion, the diffusely diseased RCA was treated medically.  4/22 Zio - Sinus with 1AVB occasional Wenckebach and transient junctional rhythm   Admitted 1/23 with COVID. Echo EF 45 - 50% global HK. RV normal.  Mod-sev MR (previously mild).   Admitted 3/23 with ADHF  Echo EF 45-50%, RV mildly reduced, PASP 67.4 mmHg, moderate to severe MR, severe TR, mild to moderate AI. Also with epistaxis requiring anterior nasal packing. EP consulted due to Mobitz Type 1 with 3.4 second pauses. Recommended conservative management with no nodal blocking agents..   Seen in ED 07/22/21 with fall, hit head. CT head negative. Follow up 4/23, volume up, REDs  41%. Torsemide increased to 40 bid x 2 days, then 60 mg daily.   Today he returns for HF follow up with his son. He is still at [facility] - now residential. Appetite improving. Weight stable 138-141. Denies CP, SOB, orthopnea or PND. Had 1 fall out of bed.  Nothing since. No dizziness. SBP at SNF 103-135. Mostly 120s.   Past Medical History:  Diagnosis Date   Anemia due to chronic kidney disease    Bilateral inguinal hernia    CAD (coronary artery disease) cardiologist-  dr Louis Gaw   PTCA of OM in 1998, [place] in 2000, Cath 9/07 LM ok LAD ok. LCX 95% in OM prior to previous stent RCA. nondominant normal EF normal. Cypher DES to OM 2007 (PLACED PROXIAMAL TO  PREVIOUS STENT)   Chronic kidney disease, stage III (moderate) ([place])    nephrologist-  dr detarding   Depression    Diabetes mellitus type 2, diet-controlled ([place])    followed by pcp,  last A1c 5.2 on 09-10-2017 in epic   Gout, unspecified    10-29-2017  per pt stable   HLD (hyperlipidemia)    HTN (hypertension)    MGUS (monoclonal gammopathy of unknown significance) previously followed by dr Beryle Beams , [facility] and released 08/01/2011   IgG kappa dx 2002 8% plasma cells in bone marrow; no lesions on bone X-rays;   Nocturia    OA (osteoarthritis)    OSA on CPAP    per study 01-30-2004  severe osa , AHI 51.6/hr   PAD (peripheral artery disease) ([place])    05-21-2006  left renal artery stenosis, s/p balloon angioplasty and stenting;  last duplex 02/ 2012  normal , arteries patent   S/P coronary artery stent placement    2000--  BMS x1  to OM;   2007-- DES x1  to OM proximal to previous stent   Secondary hyperparathyroidism of renal origin ([place])    Urgency of urination    Wears glasses     Current Outpatient Medications  Medication Sig Dispense Refill   ACCU-CHEK SOFTCLIX LANCETS lancets Use to test blood sugar daily. Dx:E11.8 300 each 3   acetaminophen (TYLENOL) 500 MG  tablet Take 1,000 mg by mouth every 8 (eight) hours as needed for moderate pain.     Alcohol Swabs (B-D SINGLE USE SWABS REGULAR) PADS Use with testing of blood sugar. Dx:E11.8 300 each 3   allopurinol (ZYLOPRIM) 100 MG tablet Take 1 tablet (100 mg total) by mouth daily. 90 tablet 3   apixaban (ELIQUIS) 2.5 MG TABS tablet Take 1 tablet (2.5 mg total) by mouth 2 (two) times daily. 60 tablet    aspirin EC 81 MG tablet Take 81 mg by mouth daily. Swallow whole.     atorvastatin (LIPITOR) 80 MG tablet Take 1 tablet (80 mg total) by mouth daily. 90 tablet 3   cetirizine (ZYRTEC) 10 MG tablet Take 10 mg by mouth at bedtime.     citalopram (CELEXA) 20 MG tablet Take 1 tablet (20 mg total) by mouth daily. 90 tablet 3    diphenhydrAMINE (BENADRYL) 25 MG tablet Take 25 mg by mouth 2 (two) times daily as needed for itching.     Emollient (EUCERIN) lotion Apply 1 application. topically daily. Apply to rash on  chest, abdomen, back,neck and shoulders     empagliflozin (JARDIANCE) 10 MG TABS tablet Take 1 tablet (10 mg total) by mouth daily. 30 tablet    ezetimibe (ZETIA) 10 MG tablet Take 10 mg by mouth daily.     ferrous sulfate 325 (65 FE) MG tablet Take 325 mg by mouth daily with breakfast.     glucose blood (ACCU-CHEK AVIVA PLUS) test strip Use to check blood sugar daily. Dx:E11.8 300 each 3   guaiFENesin (MUCINEX) 600 MG 12 hr tablet Take 600 mg by mouth 2 (two) times daily.     ipratropium-albuterol (DUONEB) 0.5-2.5 (3) MG/3ML SOLN Inhale 3 mLs into the lungs 3 (three) times daily.     levothyroxine (SYNTHROID) 50 MCG tablet Patient takes 1/2 tablet by mouth before breakfast with water only.     lidocaine (LIDODERM) 5 % Place 1 patch onto the skin daily. Remove & Discard patch within 12 hours or as directed by MD (Patient taking differently: Place 1 patch onto the skin See admin instructions. Apply to left knee in the morning,remove at bedtime) 5 patch 0   nitroGLYCERIN (NITROSTAT) 0.4 MG SL tablet DISSOLVE 1 TABLET UNDER THE TONGUE EVERY 5 MINUTES AS  NEEDED FOR CHEST PAIN. MAX  OF 3 TABLETS IN 15 MINUTES. CALL 911 IF PAIN PERSISTS. 75 tablet 1   tamsulosin (FLOMAX) 0.4 MG CAPS capsule Take 0.4 mg by mouth daily.     torsemide (DEMADEX) 20 MG tablet Take 3 tablets (60 mg total) by mouth daily.  3   white petrolatum (VASELINE) GEL Apply 1 application. topically 3 (three) times daily as needed (inside bilateral nares).     Zinc Oxide 25 % PSTE Apply 1 application. topically in the morning and at bedtime. Apply to bottom during incontinence care     No current facility-administered medications for this encounter.   Social History   Tobacco Use   Smoking status: Some Days    Packs/day: 0.25    Types: Cigarettes    Smokeless tobacco: Never   Tobacco comments:    10-29-2017  per pt was smoking 1pp2d, stopped June 2019  Vaping Use   Vaping Use: Never used  Substance Use Topics   Alcohol use: Not Currently   Drug use: No   Family History  Problem Relation Age of Onset   Heart disease Mother    Healthy Father    Heart  disease Sister    Heart disease Sister    Cancer Sister        type unknown   Hypertension Sister    Diabetes Brother    Diabetes Brother    Diabetes Other    Coronary artery disease Other    Cancer Other    Wt Readings from Last 3 Encounters:  08/26/21 63.4 kg (139 lb 12.8 oz)  07/31/21 70.8 kg (156 lb)  07/22/21 85.7 kg (189 lb)   BP (!) 102/58   Pulse 83   SpO2 98% Comment: 2 l n/c  PHYSICAL EXAM: General:  Thin elderly male in WC No resp difficulty HEENT: normal Neck: supple.  JVP 7  Carotids 2+ bilat; no bruits. No lymphadenopathy or thryomegaly appreciated. Cor: PMI nondisplaced. Regular rate & rhythm. 2/6 TR soft AI Lungs: clear Abdomen: soft, nontender, nondistended. No hepatosplenomegaly. No bruits or masses. Good bowel sounds. Extremities: no cyanosis, clubbing, rash, edema Neuro: alert & orientedx3, cranial nerves grossly intact. moves all 4 extremities w/o difficulty. Affect pleasant   ASSESSMENT & PLAN:  1. Chronic Diastolic Heart Failure/Ischemic CM - EF 45-50% on echo 4/22. - Echo (3/23): EF 45-50%, LV mildly down with global LV HK, mild LVH, RV mildly reduced, severely elevated PASP 67.4 mmHg, moderate to severe MR, severe TR, mild to moderate AI. - Improved NYHA II-early III, functional class difficult due to deconditioning.  - Volume status  - Continue torsemide 60 mg daily. - Continue Jardiance 10 mg daily.  - Consider addition of spiro pending renal function (last SCr > 2). - No b-blocker due to HB. - No ACE/ARB/ARNi with hx angioedema on ace and arb. - Check labs today. Adjust diuretics as needed  2. CAD  - s/p NSTEMI 4/22.  - Cath  4/22 with multivessel CAD with 85% proximal RCA, 85% mid RCA, 90% distal RCA, 80% mid LAD, 80% OM 2 lesion. Staged PCI with DES to the mid LAD, balloon angioplasty of left circumflex lesion, the diffusely diseased RCA was treated medically. - No s/s angina. Plavix recently stopped due to epistaxis/anemia. - On DOAC with ASA + Eliquis. - Continue statin. - Continue Jardiance.  3. Paroxysmal Atrial Flutter - Continue Eliquis 2.5 mg bid. - CHA2DS2-VASc Score = 5  - 39% burden by zio 09/2020 - Seen by EP during recent admit, not a good candidate for antiarrhythmic drugs. - In NSR today   4. Mobitz 1 second degree HB in setting of NSTEMI 4/22 - No beta blocker. - No current indication for PPM.  5. HTN - Blood pressure well controlled.  - Continue current regimen. Avoid hypotensions  6. Aortic insufficiency/severe TR/moderate MR - Mild AI on echo 4/22 (stable from 2018) - Mild to moderate AI on echo 3/23. - Severe TR on echo 3/23 - Moderate to severe MR on echo 3/23 - Clinically stable. Not mTEER candidate currently with lack of symptoms and with frailty/debility. - Follow   7. CKD 3b - recent Scr ~2.0-2.1 - On SGLT2 - He is followed by [facility]. - Labs today - Avoid hypotension  8.T2DM - On SGLT2i. No GU symptoms.  9. Physical deconditioning - Continue PT at [facility]. - Son says palliative is following at facility.   Glori Bickers, MD 11:05 AM  Addendum: QTc > 600 on ECG. Meds reviewed in detail. Will stop SSRI.   Glori Bickers, MD  4:44 PM

## 2021-09-10 DIAGNOSIS — I4891 Unspecified atrial fibrillation: Secondary | ICD-10-CM | POA: Diagnosis not present

## 2021-09-10 DIAGNOSIS — I739 Peripheral vascular disease, unspecified: Secondary | ICD-10-CM | POA: Diagnosis not present

## 2021-09-10 DIAGNOSIS — E1159 Type 2 diabetes mellitus with other circulatory complications: Secondary | ICD-10-CM | POA: Diagnosis not present

## 2021-09-10 DIAGNOSIS — E119 Type 2 diabetes mellitus without complications: Secondary | ICD-10-CM | POA: Diagnosis not present

## 2021-09-10 DIAGNOSIS — J984 Other disorders of lung: Secondary | ICD-10-CM | POA: Diagnosis not present

## 2021-09-10 DIAGNOSIS — R2689 Other abnormalities of gait and mobility: Secondary | ICD-10-CM | POA: Diagnosis not present

## 2021-09-10 DIAGNOSIS — U099 Post covid-19 condition, unspecified: Secondary | ICD-10-CM | POA: Diagnosis not present

## 2021-09-10 DIAGNOSIS — R262 Difficulty in walking, not elsewhere classified: Secondary | ICD-10-CM | POA: Diagnosis not present

## 2021-09-10 DIAGNOSIS — G9340 Encephalopathy, unspecified: Secondary | ICD-10-CM | POA: Diagnosis not present

## 2021-09-10 DIAGNOSIS — M6281 Muscle weakness (generalized): Secondary | ICD-10-CM | POA: Diagnosis not present

## 2021-09-10 DIAGNOSIS — R0602 Shortness of breath: Secondary | ICD-10-CM | POA: Diagnosis not present

## 2021-09-10 DIAGNOSIS — A419 Sepsis, unspecified organism: Secondary | ICD-10-CM | POA: Diagnosis not present

## 2021-09-10 DIAGNOSIS — N183 Chronic kidney disease, stage 3 unspecified: Secondary | ICD-10-CM | POA: Diagnosis not present

## 2021-09-10 DIAGNOSIS — R41841 Cognitive communication deficit: Secondary | ICD-10-CM | POA: Diagnosis not present

## 2021-09-10 DIAGNOSIS — R1312 Dysphagia, oropharyngeal phase: Secondary | ICD-10-CM | POA: Diagnosis not present

## 2021-09-10 DIAGNOSIS — I2510 Atherosclerotic heart disease of native coronary artery without angina pectoris: Secondary | ICD-10-CM | POA: Diagnosis not present

## 2021-09-10 DIAGNOSIS — I5042 Chronic combined systolic (congestive) and diastolic (congestive) heart failure: Secondary | ICD-10-CM | POA: Diagnosis not present

## 2021-09-10 DIAGNOSIS — R7989 Other specified abnormal findings of blood chemistry: Secondary | ICD-10-CM | POA: Diagnosis not present

## 2021-09-10 DIAGNOSIS — I5043 Acute on chronic combined systolic (congestive) and diastolic (congestive) heart failure: Secondary | ICD-10-CM | POA: Diagnosis not present

## 2021-09-10 DIAGNOSIS — I4892 Unspecified atrial flutter: Secondary | ICD-10-CM | POA: Diagnosis not present

## 2021-09-10 DIAGNOSIS — I340 Nonrheumatic mitral (valve) insufficiency: Secondary | ICD-10-CM | POA: Diagnosis not present

## 2021-09-10 DIAGNOSIS — L988 Other specified disorders of the skin and subcutaneous tissue: Secondary | ICD-10-CM | POA: Diagnosis not present

## 2021-09-10 DIAGNOSIS — R042 Hemoptysis: Secondary | ICD-10-CM | POA: Diagnosis not present

## 2021-09-10 DIAGNOSIS — J9601 Acute respiratory failure with hypoxia: Secondary | ICD-10-CM | POA: Diagnosis not present

## 2021-09-11 DIAGNOSIS — R1312 Dysphagia, oropharyngeal phase: Secondary | ICD-10-CM | POA: Diagnosis not present

## 2021-09-11 DIAGNOSIS — R2689 Other abnormalities of gait and mobility: Secondary | ICD-10-CM | POA: Diagnosis not present

## 2021-09-11 DIAGNOSIS — R41841 Cognitive communication deficit: Secondary | ICD-10-CM | POA: Diagnosis not present

## 2021-09-11 DIAGNOSIS — R262 Difficulty in walking, not elsewhere classified: Secondary | ICD-10-CM | POA: Diagnosis not present

## 2021-09-11 DIAGNOSIS — J9601 Acute respiratory failure with hypoxia: Secondary | ICD-10-CM | POA: Diagnosis not present

## 2021-09-11 DIAGNOSIS — I5043 Acute on chronic combined systolic (congestive) and diastolic (congestive) heart failure: Secondary | ICD-10-CM | POA: Diagnosis not present

## 2021-09-11 DIAGNOSIS — M6281 Muscle weakness (generalized): Secondary | ICD-10-CM | POA: Diagnosis not present

## 2021-09-12 ENCOUNTER — Encounter (HOSPITAL_COMMUNITY): Payer: Self-pay | Admitting: Internal Medicine

## 2021-09-12 ENCOUNTER — Ambulatory Visit (HOSPITAL_COMMUNITY)
Admission: RE | Admit: 2021-09-12 | Discharge: 2021-09-12 | Disposition: A | Discharge status: Home / Self Care | Payer: Medicare HMO | Source: Ambulatory Visit | Attending: Internal Medicine | Admitting: Internal Medicine

## 2021-09-12 VITALS — BP 102/58 | HR 83

## 2021-09-12 DIAGNOSIS — I130 Hypertensive heart and chronic kidney disease with heart failure and stage 1 through stage 4 chronic kidney disease, or unspecified chronic kidney disease: Secondary | ICD-10-CM | POA: Insufficient documentation

## 2021-09-12 DIAGNOSIS — Z7901 Long term (current) use of anticoagulants: Secondary | ICD-10-CM | POA: Insufficient documentation

## 2021-09-12 DIAGNOSIS — M6281 Muscle weakness (generalized): Secondary | ICD-10-CM | POA: Diagnosis not present

## 2021-09-12 DIAGNOSIS — I252 Old myocardial infarction: Secondary | ICD-10-CM | POA: Insufficient documentation

## 2021-09-12 DIAGNOSIS — I255 Ischemic cardiomyopathy: Secondary | ICD-10-CM | POA: Diagnosis not present

## 2021-09-12 DIAGNOSIS — R1312 Dysphagia, oropharyngeal phase: Secondary | ICD-10-CM | POA: Diagnosis not present

## 2021-09-12 DIAGNOSIS — I38 Endocarditis, valve unspecified: Secondary | ICD-10-CM

## 2021-09-12 DIAGNOSIS — E1151 Type 2 diabetes mellitus with diabetic peripheral angiopathy without gangrene: Secondary | ICD-10-CM | POA: Diagnosis not present

## 2021-09-12 DIAGNOSIS — N183 Chronic kidney disease, stage 3 unspecified: Secondary | ICD-10-CM

## 2021-09-12 DIAGNOSIS — Z955 Presence of coronary angioplasty implant and graft: Secondary | ICD-10-CM | POA: Insufficient documentation

## 2021-09-12 DIAGNOSIS — R41841 Cognitive communication deficit: Secondary | ICD-10-CM | POA: Diagnosis not present

## 2021-09-12 DIAGNOSIS — R2689 Other abnormalities of gait and mobility: Secondary | ICD-10-CM | POA: Diagnosis not present

## 2021-09-12 DIAGNOSIS — Z79899 Other long term (current) drug therapy: Secondary | ICD-10-CM | POA: Diagnosis not present

## 2021-09-12 DIAGNOSIS — R040 Epistaxis: Secondary | ICD-10-CM | POA: Diagnosis not present

## 2021-09-12 DIAGNOSIS — E1122 Type 2 diabetes mellitus with diabetic chronic kidney disease: Secondary | ICD-10-CM | POA: Insufficient documentation

## 2021-09-12 DIAGNOSIS — I2510 Atherosclerotic heart disease of native coronary artery without angina pectoris: Secondary | ICD-10-CM

## 2021-09-12 DIAGNOSIS — Z7984 Long term (current) use of oral hypoglycemic drugs: Secondary | ICD-10-CM | POA: Diagnosis not present

## 2021-09-12 DIAGNOSIS — R262 Difficulty in walking, not elsewhere classified: Secondary | ICD-10-CM | POA: Diagnosis not present

## 2021-09-12 DIAGNOSIS — I441 Atrioventricular block, second degree: Secondary | ICD-10-CM | POA: Diagnosis not present

## 2021-09-12 DIAGNOSIS — I4892 Unspecified atrial flutter: Secondary | ICD-10-CM | POA: Insufficient documentation

## 2021-09-12 DIAGNOSIS — I5032 Chronic diastolic (congestive) heart failure: Secondary | ICD-10-CM | POA: Insufficient documentation

## 2021-09-12 DIAGNOSIS — N1832 Chronic kidney disease, stage 3b: Secondary | ICD-10-CM | POA: Insufficient documentation

## 2021-09-12 DIAGNOSIS — I082 Rheumatic disorders of both aortic and tricuspid valves: Secondary | ICD-10-CM | POA: Insufficient documentation

## 2021-09-12 DIAGNOSIS — J9601 Acute respiratory failure with hypoxia: Secondary | ICD-10-CM | POA: Diagnosis not present

## 2021-09-12 DIAGNOSIS — I5043 Acute on chronic combined systolic (congestive) and diastolic (congestive) heart failure: Secondary | ICD-10-CM | POA: Diagnosis not present

## 2021-09-12 LAB — COMPREHENSIVE METABOLIC PANEL
ALT: 29 U/L (ref 0–44)
AST: 31 U/L (ref 15–41)
Albumin: 3.7 g/dL (ref 3.5–5.0)
Alkaline Phosphatase: 88 U/L (ref 38–126)
Anion gap: 10 (ref 5–15)
BUN: 76 mg/dL — ABNORMAL HIGH (ref 8–23)
CO2: 29 mmol/L (ref 22–32)
Calcium: 9.7 mg/dL (ref 8.9–10.3)
Chloride: 98 mmol/L (ref 98–111)
Creatinine, Ser: 2.37 mg/dL — ABNORMAL HIGH (ref 0.61–1.24)
GFR, Estimated: 26 mL/min — ABNORMAL LOW (ref >60)
Glucose, Bld: 103 mg/dL — ABNORMAL HIGH (ref 70–99)
Potassium: 4.3 mmol/L (ref 3.5–5.1)
Sodium: 137 mmol/L (ref 135–145)
Total Bilirubin: 0.8 mg/dL (ref 0.3–1.2)
Total Protein: 9.2 g/dL — ABNORMAL HIGH (ref 6.5–8.1)

## 2021-09-12 LAB — BRAIN NATRIURETIC PEPTIDE: B Natriuretic Peptide: 274.5 pg/mL — ABNORMAL HIGH (ref 0.0–100.0)

## 2021-09-12 LAB — CBC
HCT: 35.7 % — ABNORMAL LOW (ref 39.0–52.0)
Hemoglobin: 12.1 g/dL — ABNORMAL LOW (ref 13.0–17.0)
MCH: 31.6 pg (ref 26.0–34.0)
MCHC: 33.9 g/dL (ref 30.0–36.0)
MCV: 93.2 fL (ref 80.0–100.0)
Platelets: 414 10*3/uL — ABNORMAL HIGH (ref 150–400)
RBC: 3.83 MIL/uL — ABNORMAL LOW (ref 4.22–5.81)
RDW: 17.3 % — ABNORMAL HIGH (ref 11.5–15.5)
WBC: 8.5 10*3/uL (ref 4.0–10.5)
nRBC: 0 % (ref 0.0–0.2)

## 2021-09-12 NOTE — Addendum Note (Signed)
Encounter addended by: Stanford Scotland, RN on: 09/12/2021 11:45 AM  Actions taken: Medication long-term status modified, Order list changed, Clinical Note Signed

## 2021-09-12 NOTE — Patient Instructions (Addendum)
Following orders sent back to [place] [facility]:  Labs done in our office today (cmet, cbc, bnp) Continue current medications Follow up in 4 months STOP Celexa

## 2021-09-12 NOTE — Addendum Note (Signed)
Encounter addended by: Jolaine Artist, MD on: 09/12/2021 4:44 PM  Actions taken: Clinical Note Signed

## 2021-09-13 DIAGNOSIS — I5043 Acute on chronic combined systolic (congestive) and diastolic (congestive) heart failure: Secondary | ICD-10-CM | POA: Diagnosis not present

## 2021-09-13 DIAGNOSIS — R1312 Dysphagia, oropharyngeal phase: Secondary | ICD-10-CM | POA: Diagnosis not present

## 2021-09-13 DIAGNOSIS — R41841 Cognitive communication deficit: Secondary | ICD-10-CM | POA: Diagnosis not present

## 2021-09-13 DIAGNOSIS — R262 Difficulty in walking, not elsewhere classified: Secondary | ICD-10-CM | POA: Diagnosis not present

## 2021-09-13 DIAGNOSIS — M6281 Muscle weakness (generalized): Secondary | ICD-10-CM | POA: Diagnosis not present

## 2021-09-13 DIAGNOSIS — R2689 Other abnormalities of gait and mobility: Secondary | ICD-10-CM | POA: Diagnosis not present

## 2021-09-13 DIAGNOSIS — J9601 Acute respiratory failure with hypoxia: Secondary | ICD-10-CM | POA: Diagnosis not present

## 2021-09-16 DIAGNOSIS — R2689 Other abnormalities of gait and mobility: Secondary | ICD-10-CM | POA: Diagnosis not present

## 2021-09-16 DIAGNOSIS — R042 Hemoptysis: Secondary | ICD-10-CM | POA: Diagnosis not present

## 2021-09-16 DIAGNOSIS — R262 Difficulty in walking, not elsewhere classified: Secondary | ICD-10-CM | POA: Diagnosis not present

## 2021-09-16 DIAGNOSIS — A419 Sepsis, unspecified organism: Secondary | ICD-10-CM | POA: Diagnosis not present

## 2021-09-16 DIAGNOSIS — E119 Type 2 diabetes mellitus without complications: Secondary | ICD-10-CM | POA: Diagnosis not present

## 2021-09-16 DIAGNOSIS — I4891 Unspecified atrial fibrillation: Secondary | ICD-10-CM | POA: Diagnosis not present

## 2021-09-16 DIAGNOSIS — R1312 Dysphagia, oropharyngeal phase: Secondary | ICD-10-CM | POA: Diagnosis not present

## 2021-09-16 DIAGNOSIS — R0602 Shortness of breath: Secondary | ICD-10-CM | POA: Diagnosis not present

## 2021-09-16 DIAGNOSIS — J9601 Acute respiratory failure with hypoxia: Secondary | ICD-10-CM | POA: Diagnosis not present

## 2021-09-16 DIAGNOSIS — I5043 Acute on chronic combined systolic (congestive) and diastolic (congestive) heart failure: Secondary | ICD-10-CM | POA: Diagnosis not present

## 2021-09-16 DIAGNOSIS — M6281 Muscle weakness (generalized): Secondary | ICD-10-CM | POA: Diagnosis not present

## 2021-09-16 DIAGNOSIS — R41841 Cognitive communication deficit: Secondary | ICD-10-CM | POA: Diagnosis not present

## 2021-09-16 DIAGNOSIS — I5042 Chronic combined systolic (congestive) and diastolic (congestive) heart failure: Secondary | ICD-10-CM | POA: Diagnosis not present

## 2021-09-16 DIAGNOSIS — U099 Post covid-19 condition, unspecified: Secondary | ICD-10-CM | POA: Diagnosis not present

## 2021-09-16 DIAGNOSIS — G9340 Encephalopathy, unspecified: Secondary | ICD-10-CM | POA: Diagnosis not present

## 2021-09-17 DIAGNOSIS — R262 Difficulty in walking, not elsewhere classified: Secondary | ICD-10-CM | POA: Diagnosis not present

## 2021-09-17 DIAGNOSIS — L603 Nail dystrophy: Secondary | ICD-10-CM | POA: Diagnosis not present

## 2021-09-17 DIAGNOSIS — M6281 Muscle weakness (generalized): Secondary | ICD-10-CM | POA: Diagnosis not present

## 2021-09-17 DIAGNOSIS — R2689 Other abnormalities of gait and mobility: Secondary | ICD-10-CM | POA: Diagnosis not present

## 2021-09-17 DIAGNOSIS — I739 Peripheral vascular disease, unspecified: Secondary | ICD-10-CM | POA: Diagnosis not present

## 2021-09-17 DIAGNOSIS — R41841 Cognitive communication deficit: Secondary | ICD-10-CM | POA: Diagnosis not present

## 2021-09-17 DIAGNOSIS — J9601 Acute respiratory failure with hypoxia: Secondary | ICD-10-CM | POA: Diagnosis not present

## 2021-09-17 DIAGNOSIS — E1151 Type 2 diabetes mellitus with diabetic peripheral angiopathy without gangrene: Secondary | ICD-10-CM | POA: Diagnosis not present

## 2021-09-17 DIAGNOSIS — I5043 Acute on chronic combined systolic (congestive) and diastolic (congestive) heart failure: Secondary | ICD-10-CM | POA: Diagnosis not present

## 2021-09-17 DIAGNOSIS — R1312 Dysphagia, oropharyngeal phase: Secondary | ICD-10-CM | POA: Diagnosis not present

## 2021-09-18 DIAGNOSIS — R2689 Other abnormalities of gait and mobility: Secondary | ICD-10-CM | POA: Diagnosis not present

## 2021-09-18 DIAGNOSIS — R262 Difficulty in walking, not elsewhere classified: Secondary | ICD-10-CM | POA: Diagnosis not present

## 2021-09-18 DIAGNOSIS — J9601 Acute respiratory failure with hypoxia: Secondary | ICD-10-CM | POA: Diagnosis not present

## 2021-09-18 DIAGNOSIS — M6281 Muscle weakness (generalized): Secondary | ICD-10-CM | POA: Diagnosis not present

## 2021-09-18 DIAGNOSIS — R41841 Cognitive communication deficit: Secondary | ICD-10-CM | POA: Diagnosis not present

## 2021-09-18 DIAGNOSIS — I5043 Acute on chronic combined systolic (congestive) and diastolic (congestive) heart failure: Secondary | ICD-10-CM | POA: Diagnosis not present

## 2021-09-18 DIAGNOSIS — R1312 Dysphagia, oropharyngeal phase: Secondary | ICD-10-CM | POA: Diagnosis not present

## 2021-09-19 DIAGNOSIS — R2689 Other abnormalities of gait and mobility: Secondary | ICD-10-CM | POA: Diagnosis not present

## 2021-09-19 DIAGNOSIS — R1312 Dysphagia, oropharyngeal phase: Secondary | ICD-10-CM | POA: Diagnosis not present

## 2021-09-19 DIAGNOSIS — R41841 Cognitive communication deficit: Secondary | ICD-10-CM | POA: Diagnosis not present

## 2021-09-19 DIAGNOSIS — J9601 Acute respiratory failure with hypoxia: Secondary | ICD-10-CM | POA: Diagnosis not present

## 2021-09-19 DIAGNOSIS — R262 Difficulty in walking, not elsewhere classified: Secondary | ICD-10-CM | POA: Diagnosis not present

## 2021-09-19 DIAGNOSIS — I5043 Acute on chronic combined systolic (congestive) and diastolic (congestive) heart failure: Secondary | ICD-10-CM | POA: Diagnosis not present

## 2021-09-19 DIAGNOSIS — M6281 Muscle weakness (generalized): Secondary | ICD-10-CM | POA: Diagnosis not present

## 2021-09-20 DIAGNOSIS — R2689 Other abnormalities of gait and mobility: Secondary | ICD-10-CM | POA: Diagnosis not present

## 2021-09-20 DIAGNOSIS — I5043 Acute on chronic combined systolic (congestive) and diastolic (congestive) heart failure: Secondary | ICD-10-CM | POA: Diagnosis not present

## 2021-09-20 DIAGNOSIS — R41841 Cognitive communication deficit: Secondary | ICD-10-CM | POA: Diagnosis not present

## 2021-09-20 DIAGNOSIS — R1312 Dysphagia, oropharyngeal phase: Secondary | ICD-10-CM | POA: Diagnosis not present

## 2021-09-20 DIAGNOSIS — M6281 Muscle weakness (generalized): Secondary | ICD-10-CM | POA: Diagnosis not present

## 2021-09-20 DIAGNOSIS — J9601 Acute respiratory failure with hypoxia: Secondary | ICD-10-CM | POA: Diagnosis not present

## 2021-09-20 DIAGNOSIS — R262 Difficulty in walking, not elsewhere classified: Secondary | ICD-10-CM | POA: Diagnosis not present

## 2021-09-24 DIAGNOSIS — I4891 Unspecified atrial fibrillation: Secondary | ICD-10-CM | POA: Diagnosis not present

## 2021-09-24 DIAGNOSIS — U099 Post covid-19 condition, unspecified: Secondary | ICD-10-CM | POA: Diagnosis not present

## 2021-09-24 DIAGNOSIS — M6281 Muscle weakness (generalized): Secondary | ICD-10-CM | POA: Diagnosis not present

## 2021-09-24 DIAGNOSIS — G9340 Encephalopathy, unspecified: Secondary | ICD-10-CM | POA: Diagnosis not present

## 2021-09-24 DIAGNOSIS — R0602 Shortness of breath: Secondary | ICD-10-CM | POA: Diagnosis not present

## 2021-09-24 DIAGNOSIS — A419 Sepsis, unspecified organism: Secondary | ICD-10-CM | POA: Diagnosis not present

## 2021-09-24 DIAGNOSIS — I5042 Chronic combined systolic (congestive) and diastolic (congestive) heart failure: Secondary | ICD-10-CM | POA: Diagnosis not present

## 2021-09-24 DIAGNOSIS — E119 Type 2 diabetes mellitus without complications: Secondary | ICD-10-CM | POA: Diagnosis not present

## 2021-09-24 DIAGNOSIS — R042 Hemoptysis: Secondary | ICD-10-CM | POA: Diagnosis not present

## 2021-09-30 DIAGNOSIS — R1312 Dysphagia, oropharyngeal phase: Secondary | ICD-10-CM | POA: Diagnosis not present

## 2021-09-30 DIAGNOSIS — M6281 Muscle weakness (generalized): Secondary | ICD-10-CM | POA: Diagnosis not present

## 2021-09-30 DIAGNOSIS — R262 Difficulty in walking, not elsewhere classified: Secondary | ICD-10-CM | POA: Diagnosis not present

## 2021-09-30 DIAGNOSIS — R41841 Cognitive communication deficit: Secondary | ICD-10-CM | POA: Diagnosis not present

## 2021-09-30 DIAGNOSIS — R2689 Other abnormalities of gait and mobility: Secondary | ICD-10-CM | POA: Diagnosis not present

## 2021-09-30 DIAGNOSIS — I5043 Acute on chronic combined systolic (congestive) and diastolic (congestive) heart failure: Secondary | ICD-10-CM | POA: Diagnosis not present

## 2021-09-30 DIAGNOSIS — J9601 Acute respiratory failure with hypoxia: Secondary | ICD-10-CM | POA: Diagnosis not present

## 2021-10-01 DIAGNOSIS — I5043 Acute on chronic combined systolic (congestive) and diastolic (congestive) heart failure: Secondary | ICD-10-CM | POA: Diagnosis not present

## 2021-10-01 DIAGNOSIS — J9601 Acute respiratory failure with hypoxia: Secondary | ICD-10-CM | POA: Diagnosis not present

## 2021-10-01 DIAGNOSIS — R1312 Dysphagia, oropharyngeal phase: Secondary | ICD-10-CM | POA: Diagnosis not present

## 2021-10-01 DIAGNOSIS — R262 Difficulty in walking, not elsewhere classified: Secondary | ICD-10-CM | POA: Diagnosis not present

## 2021-10-01 DIAGNOSIS — M6281 Muscle weakness (generalized): Secondary | ICD-10-CM | POA: Diagnosis not present

## 2021-10-01 DIAGNOSIS — R2689 Other abnormalities of gait and mobility: Secondary | ICD-10-CM | POA: Diagnosis not present

## 2021-10-01 DIAGNOSIS — R41841 Cognitive communication deficit: Secondary | ICD-10-CM | POA: Diagnosis not present

## 2021-10-02 DIAGNOSIS — R41841 Cognitive communication deficit: Secondary | ICD-10-CM | POA: Diagnosis not present

## 2021-10-02 DIAGNOSIS — J9601 Acute respiratory failure with hypoxia: Secondary | ICD-10-CM | POA: Diagnosis not present

## 2021-10-02 DIAGNOSIS — R1312 Dysphagia, oropharyngeal phase: Secondary | ICD-10-CM | POA: Diagnosis not present

## 2021-10-02 DIAGNOSIS — R2689 Other abnormalities of gait and mobility: Secondary | ICD-10-CM | POA: Diagnosis not present

## 2021-10-02 DIAGNOSIS — R262 Difficulty in walking, not elsewhere classified: Secondary | ICD-10-CM | POA: Diagnosis not present

## 2021-10-02 DIAGNOSIS — M6281 Muscle weakness (generalized): Secondary | ICD-10-CM | POA: Diagnosis not present

## 2021-10-02 DIAGNOSIS — I5043 Acute on chronic combined systolic (congestive) and diastolic (congestive) heart failure: Secondary | ICD-10-CM | POA: Diagnosis not present

## 2021-10-03 DIAGNOSIS — R41841 Cognitive communication deficit: Secondary | ICD-10-CM | POA: Diagnosis not present

## 2021-10-03 DIAGNOSIS — R262 Difficulty in walking, not elsewhere classified: Secondary | ICD-10-CM | POA: Diagnosis not present

## 2021-10-03 DIAGNOSIS — R2689 Other abnormalities of gait and mobility: Secondary | ICD-10-CM | POA: Diagnosis not present

## 2021-10-03 DIAGNOSIS — R1312 Dysphagia, oropharyngeal phase: Secondary | ICD-10-CM | POA: Diagnosis not present

## 2021-10-03 DIAGNOSIS — J9601 Acute respiratory failure with hypoxia: Secondary | ICD-10-CM | POA: Diagnosis not present

## 2021-10-03 DIAGNOSIS — M6281 Muscle weakness (generalized): Secondary | ICD-10-CM | POA: Diagnosis not present

## 2021-10-03 DIAGNOSIS — I5043 Acute on chronic combined systolic (congestive) and diastolic (congestive) heart failure: Secondary | ICD-10-CM | POA: Diagnosis not present

## 2021-10-06 DIAGNOSIS — R2689 Other abnormalities of gait and mobility: Secondary | ICD-10-CM | POA: Diagnosis not present

## 2021-10-06 DIAGNOSIS — J9601 Acute respiratory failure with hypoxia: Secondary | ICD-10-CM | POA: Diagnosis not present

## 2021-10-06 DIAGNOSIS — R41841 Cognitive communication deficit: Secondary | ICD-10-CM | POA: Diagnosis not present

## 2021-10-06 DIAGNOSIS — M6281 Muscle weakness (generalized): Secondary | ICD-10-CM | POA: Diagnosis not present

## 2021-10-06 DIAGNOSIS — R262 Difficulty in walking, not elsewhere classified: Secondary | ICD-10-CM | POA: Diagnosis not present

## 2021-10-06 DIAGNOSIS — I5043 Acute on chronic combined systolic (congestive) and diastolic (congestive) heart failure: Secondary | ICD-10-CM | POA: Diagnosis not present

## 2021-10-06 DIAGNOSIS — R1312 Dysphagia, oropharyngeal phase: Secondary | ICD-10-CM | POA: Diagnosis not present

## 2021-10-07 DIAGNOSIS — M6281 Muscle weakness (generalized): Secondary | ICD-10-CM | POA: Diagnosis not present

## 2021-10-07 DIAGNOSIS — R2689 Other abnormalities of gait and mobility: Secondary | ICD-10-CM | POA: Diagnosis not present

## 2021-10-07 DIAGNOSIS — R262 Difficulty in walking, not elsewhere classified: Secondary | ICD-10-CM | POA: Diagnosis not present

## 2021-10-07 DIAGNOSIS — R41841 Cognitive communication deficit: Secondary | ICD-10-CM | POA: Diagnosis not present

## 2021-10-07 DIAGNOSIS — R1312 Dysphagia, oropharyngeal phase: Secondary | ICD-10-CM | POA: Diagnosis not present

## 2021-10-07 DIAGNOSIS — I5043 Acute on chronic combined systolic (congestive) and diastolic (congestive) heart failure: Secondary | ICD-10-CM | POA: Diagnosis not present

## 2021-10-07 DIAGNOSIS — J9601 Acute respiratory failure with hypoxia: Secondary | ICD-10-CM | POA: Diagnosis not present

## 2021-10-08 DIAGNOSIS — R2689 Other abnormalities of gait and mobility: Secondary | ICD-10-CM | POA: Diagnosis not present

## 2021-10-08 DIAGNOSIS — R262 Difficulty in walking, not elsewhere classified: Secondary | ICD-10-CM | POA: Diagnosis not present

## 2021-10-08 DIAGNOSIS — J9601 Acute respiratory failure with hypoxia: Secondary | ICD-10-CM | POA: Diagnosis not present

## 2021-10-08 DIAGNOSIS — R41841 Cognitive communication deficit: Secondary | ICD-10-CM | POA: Diagnosis not present

## 2021-10-08 DIAGNOSIS — N183 Chronic kidney disease, stage 3 unspecified: Secondary | ICD-10-CM | POA: Diagnosis not present

## 2021-10-08 DIAGNOSIS — I5043 Acute on chronic combined systolic (congestive) and diastolic (congestive) heart failure: Secondary | ICD-10-CM | POA: Diagnosis not present

## 2021-10-08 DIAGNOSIS — I4891 Unspecified atrial fibrillation: Secondary | ICD-10-CM | POA: Diagnosis not present

## 2021-10-08 DIAGNOSIS — R1312 Dysphagia, oropharyngeal phase: Secondary | ICD-10-CM | POA: Diagnosis not present

## 2021-10-08 DIAGNOSIS — M6281 Muscle weakness (generalized): Secondary | ICD-10-CM | POA: Diagnosis not present

## 2021-10-08 DIAGNOSIS — I10 Essential (primary) hypertension: Secondary | ICD-10-CM | POA: Diagnosis not present

## 2021-10-08 DIAGNOSIS — E08621 Diabetes mellitus due to underlying condition with foot ulcer: Secondary | ICD-10-CM | POA: Diagnosis not present

## 2021-10-09 DIAGNOSIS — I5043 Acute on chronic combined systolic (congestive) and diastolic (congestive) heart failure: Secondary | ICD-10-CM | POA: Diagnosis not present

## 2021-10-09 DIAGNOSIS — G9340 Encephalopathy, unspecified: Secondary | ICD-10-CM | POA: Diagnosis not present

## 2021-10-09 DIAGNOSIS — R262 Difficulty in walking, not elsewhere classified: Secondary | ICD-10-CM | POA: Diagnosis not present

## 2021-10-09 DIAGNOSIS — R0602 Shortness of breath: Secondary | ICD-10-CM | POA: Diagnosis not present

## 2021-10-09 DIAGNOSIS — I5042 Chronic combined systolic (congestive) and diastolic (congestive) heart failure: Secondary | ICD-10-CM | POA: Diagnosis not present

## 2021-10-09 DIAGNOSIS — I4891 Unspecified atrial fibrillation: Secondary | ICD-10-CM | POA: Diagnosis not present

## 2021-10-09 DIAGNOSIS — E119 Type 2 diabetes mellitus without complications: Secondary | ICD-10-CM | POA: Diagnosis not present

## 2021-10-09 DIAGNOSIS — U099 Post covid-19 condition, unspecified: Secondary | ICD-10-CM | POA: Diagnosis not present

## 2021-10-09 DIAGNOSIS — R042 Hemoptysis: Secondary | ICD-10-CM | POA: Diagnosis not present

## 2021-10-09 DIAGNOSIS — R1312 Dysphagia, oropharyngeal phase: Secondary | ICD-10-CM | POA: Diagnosis not present

## 2021-10-09 DIAGNOSIS — J9601 Acute respiratory failure with hypoxia: Secondary | ICD-10-CM | POA: Diagnosis not present

## 2021-10-09 DIAGNOSIS — M6281 Muscle weakness (generalized): Secondary | ICD-10-CM | POA: Diagnosis not present

## 2021-10-09 DIAGNOSIS — R41841 Cognitive communication deficit: Secondary | ICD-10-CM | POA: Diagnosis not present

## 2021-10-09 DIAGNOSIS — R2689 Other abnormalities of gait and mobility: Secondary | ICD-10-CM | POA: Diagnosis not present

## 2021-10-09 DIAGNOSIS — A419 Sepsis, unspecified organism: Secondary | ICD-10-CM | POA: Diagnosis not present

## 2021-10-10 DIAGNOSIS — R1312 Dysphagia, oropharyngeal phase: Secondary | ICD-10-CM | POA: Diagnosis not present

## 2021-10-10 DIAGNOSIS — I5043 Acute on chronic combined systolic (congestive) and diastolic (congestive) heart failure: Secondary | ICD-10-CM | POA: Diagnosis not present

## 2021-10-10 DIAGNOSIS — R41841 Cognitive communication deficit: Secondary | ICD-10-CM | POA: Diagnosis not present

## 2021-10-10 DIAGNOSIS — R262 Difficulty in walking, not elsewhere classified: Secondary | ICD-10-CM | POA: Diagnosis not present

## 2021-10-10 DIAGNOSIS — J9601 Acute respiratory failure with hypoxia: Secondary | ICD-10-CM | POA: Diagnosis not present

## 2021-10-10 DIAGNOSIS — R2689 Other abnormalities of gait and mobility: Secondary | ICD-10-CM | POA: Diagnosis not present

## 2021-10-10 DIAGNOSIS — E119 Type 2 diabetes mellitus without complications: Secondary | ICD-10-CM | POA: Diagnosis not present

## 2021-10-10 DIAGNOSIS — M6281 Muscle weakness (generalized): Secondary | ICD-10-CM | POA: Diagnosis not present

## 2021-10-11 DIAGNOSIS — I5043 Acute on chronic combined systolic (congestive) and diastolic (congestive) heart failure: Secondary | ICD-10-CM | POA: Diagnosis not present

## 2021-10-11 DIAGNOSIS — R41841 Cognitive communication deficit: Secondary | ICD-10-CM | POA: Diagnosis not present

## 2021-10-11 DIAGNOSIS — M6281 Muscle weakness (generalized): Secondary | ICD-10-CM | POA: Diagnosis not present

## 2021-10-11 DIAGNOSIS — R2689 Other abnormalities of gait and mobility: Secondary | ICD-10-CM | POA: Diagnosis not present

## 2021-10-11 DIAGNOSIS — R262 Difficulty in walking, not elsewhere classified: Secondary | ICD-10-CM | POA: Diagnosis not present

## 2021-10-11 DIAGNOSIS — J9601 Acute respiratory failure with hypoxia: Secondary | ICD-10-CM | POA: Diagnosis not present

## 2021-10-11 DIAGNOSIS — R1312 Dysphagia, oropharyngeal phase: Secondary | ICD-10-CM | POA: Diagnosis not present

## 2021-10-14 DIAGNOSIS — I5043 Acute on chronic combined systolic (congestive) and diastolic (congestive) heart failure: Secondary | ICD-10-CM | POA: Diagnosis not present

## 2021-10-14 DIAGNOSIS — M6281 Muscle weakness (generalized): Secondary | ICD-10-CM | POA: Diagnosis not present

## 2021-10-14 DIAGNOSIS — R2689 Other abnormalities of gait and mobility: Secondary | ICD-10-CM | POA: Diagnosis not present

## 2021-10-14 DIAGNOSIS — R262 Difficulty in walking, not elsewhere classified: Secondary | ICD-10-CM | POA: Diagnosis not present

## 2021-10-15 DIAGNOSIS — I5043 Acute on chronic combined systolic (congestive) and diastolic (congestive) heart failure: Secondary | ICD-10-CM | POA: Diagnosis not present

## 2021-10-15 DIAGNOSIS — R2689 Other abnormalities of gait and mobility: Secondary | ICD-10-CM | POA: Diagnosis not present

## 2021-10-15 DIAGNOSIS — R262 Difficulty in walking, not elsewhere classified: Secondary | ICD-10-CM | POA: Diagnosis not present

## 2021-10-15 DIAGNOSIS — M6281 Muscle weakness (generalized): Secondary | ICD-10-CM | POA: Diagnosis not present

## 2021-10-16 DIAGNOSIS — R0602 Shortness of breath: Secondary | ICD-10-CM | POA: Diagnosis not present

## 2021-10-16 DIAGNOSIS — R262 Difficulty in walking, not elsewhere classified: Secondary | ICD-10-CM | POA: Diagnosis not present

## 2021-10-16 DIAGNOSIS — G9340 Encephalopathy, unspecified: Secondary | ICD-10-CM | POA: Diagnosis not present

## 2021-10-16 DIAGNOSIS — A419 Sepsis, unspecified organism: Secondary | ICD-10-CM | POA: Diagnosis not present

## 2021-10-16 DIAGNOSIS — E119 Type 2 diabetes mellitus without complications: Secondary | ICD-10-CM | POA: Diagnosis not present

## 2021-10-16 DIAGNOSIS — R042 Hemoptysis: Secondary | ICD-10-CM | POA: Diagnosis not present

## 2021-10-16 DIAGNOSIS — I5042 Chronic combined systolic (congestive) and diastolic (congestive) heart failure: Secondary | ICD-10-CM | POA: Diagnosis not present

## 2021-10-16 DIAGNOSIS — I4891 Unspecified atrial fibrillation: Secondary | ICD-10-CM | POA: Diagnosis not present

## 2021-10-16 DIAGNOSIS — U099 Post covid-19 condition, unspecified: Secondary | ICD-10-CM | POA: Diagnosis not present

## 2021-10-16 DIAGNOSIS — R2689 Other abnormalities of gait and mobility: Secondary | ICD-10-CM | POA: Diagnosis not present

## 2021-10-16 DIAGNOSIS — M6281 Muscle weakness (generalized): Secondary | ICD-10-CM | POA: Diagnosis not present

## 2021-10-16 DIAGNOSIS — I5043 Acute on chronic combined systolic (congestive) and diastolic (congestive) heart failure: Secondary | ICD-10-CM | POA: Diagnosis not present

## 2021-10-17 DIAGNOSIS — R262 Difficulty in walking, not elsewhere classified: Secondary | ICD-10-CM | POA: Diagnosis not present

## 2021-10-17 DIAGNOSIS — M6281 Muscle weakness (generalized): Secondary | ICD-10-CM | POA: Diagnosis not present

## 2021-10-17 DIAGNOSIS — I5043 Acute on chronic combined systolic (congestive) and diastolic (congestive) heart failure: Secondary | ICD-10-CM | POA: Diagnosis not present

## 2021-10-17 DIAGNOSIS — R2689 Other abnormalities of gait and mobility: Secondary | ICD-10-CM | POA: Diagnosis not present

## 2021-10-21 DIAGNOSIS — F4321 Adjustment disorder with depressed mood: Secondary | ICD-10-CM | POA: Diagnosis not present

## 2021-10-21 DIAGNOSIS — F4322 Adjustment disorder with anxiety: Secondary | ICD-10-CM | POA: Diagnosis not present

## 2021-10-21 DIAGNOSIS — E119 Type 2 diabetes mellitus without complications: Secondary | ICD-10-CM | POA: Diagnosis not present

## 2021-10-22 ENCOUNTER — Ambulatory Visit: Payer: Medicare HMO | Admitting: Family Medicine

## 2021-10-23 DIAGNOSIS — I4891 Unspecified atrial fibrillation: Secondary | ICD-10-CM | POA: Diagnosis not present

## 2021-10-23 DIAGNOSIS — R0602 Shortness of breath: Secondary | ICD-10-CM | POA: Diagnosis not present

## 2021-10-23 DIAGNOSIS — M6281 Muscle weakness (generalized): Secondary | ICD-10-CM | POA: Diagnosis not present

## 2021-10-23 DIAGNOSIS — G9340 Encephalopathy, unspecified: Secondary | ICD-10-CM | POA: Diagnosis not present

## 2021-10-23 DIAGNOSIS — R042 Hemoptysis: Secondary | ICD-10-CM | POA: Diagnosis not present

## 2021-10-23 DIAGNOSIS — A419 Sepsis, unspecified organism: Secondary | ICD-10-CM | POA: Diagnosis not present

## 2021-10-23 DIAGNOSIS — U099 Post covid-19 condition, unspecified: Secondary | ICD-10-CM | POA: Diagnosis not present

## 2021-10-23 DIAGNOSIS — I5042 Chronic combined systolic (congestive) and diastolic (congestive) heart failure: Secondary | ICD-10-CM | POA: Diagnosis not present

## 2021-10-23 DIAGNOSIS — E119 Type 2 diabetes mellitus without complications: Secondary | ICD-10-CM | POA: Diagnosis not present

## 2021-10-24 DIAGNOSIS — N1832 Chronic kidney disease, stage 3b: Secondary | ICD-10-CM | POA: Diagnosis not present

## 2021-10-24 DIAGNOSIS — I509 Heart failure, unspecified: Secondary | ICD-10-CM | POA: Diagnosis not present

## 2021-10-24 DIAGNOSIS — F4322 Adjustment disorder with anxiety: Secondary | ICD-10-CM | POA: Diagnosis not present

## 2021-10-24 DIAGNOSIS — F4321 Adjustment disorder with depressed mood: Secondary | ICD-10-CM | POA: Diagnosis not present

## 2021-10-24 DIAGNOSIS — N179 Acute kidney failure, unspecified: Secondary | ICD-10-CM | POA: Diagnosis not present

## 2021-10-24 DIAGNOSIS — Z789 Other specified health status: Secondary | ICD-10-CM | POA: Diagnosis not present

## 2021-10-31 DIAGNOSIS — M109 Gout, unspecified: Secondary | ICD-10-CM | POA: Diagnosis not present

## 2021-10-31 DIAGNOSIS — I10 Essential (primary) hypertension: Secondary | ICD-10-CM | POA: Diagnosis not present

## 2021-10-31 DIAGNOSIS — F4321 Adjustment disorder with depressed mood: Secondary | ICD-10-CM | POA: Diagnosis not present

## 2021-10-31 DIAGNOSIS — F4322 Adjustment disorder with anxiety: Secondary | ICD-10-CM | POA: Diagnosis not present

## 2021-11-01 DIAGNOSIS — E119 Type 2 diabetes mellitus without complications: Secondary | ICD-10-CM | POA: Diagnosis not present

## 2021-11-01 DIAGNOSIS — M79641 Pain in right hand: Secondary | ICD-10-CM | POA: Diagnosis not present

## 2021-11-04 DIAGNOSIS — F4321 Adjustment disorder with depressed mood: Secondary | ICD-10-CM | POA: Diagnosis not present

## 2021-11-04 DIAGNOSIS — M25531 Pain in right wrist: Secondary | ICD-10-CM | POA: Diagnosis not present

## 2021-11-04 DIAGNOSIS — F4322 Adjustment disorder with anxiety: Secondary | ICD-10-CM | POA: Diagnosis not present

## 2021-11-04 DIAGNOSIS — M109 Gout, unspecified: Secondary | ICD-10-CM | POA: Diagnosis not present

## 2021-11-04 DIAGNOSIS — R0989 Other specified symptoms and signs involving the circulatory and respiratory systems: Secondary | ICD-10-CM | POA: Diagnosis not present

## 2021-11-04 DIAGNOSIS — D649 Anemia, unspecified: Secondary | ICD-10-CM | POA: Diagnosis not present

## 2021-11-07 DIAGNOSIS — U099 Post covid-19 condition, unspecified: Secondary | ICD-10-CM | POA: Diagnosis not present

## 2021-11-07 DIAGNOSIS — G9340 Encephalopathy, unspecified: Secondary | ICD-10-CM | POA: Diagnosis not present

## 2021-11-07 DIAGNOSIS — E119 Type 2 diabetes mellitus without complications: Secondary | ICD-10-CM | POA: Diagnosis not present

## 2021-11-07 DIAGNOSIS — M6281 Muscle weakness (generalized): Secondary | ICD-10-CM | POA: Diagnosis not present

## 2021-11-07 DIAGNOSIS — I4891 Unspecified atrial fibrillation: Secondary | ICD-10-CM | POA: Diagnosis not present

## 2021-11-07 DIAGNOSIS — R042 Hemoptysis: Secondary | ICD-10-CM | POA: Diagnosis not present

## 2021-11-07 DIAGNOSIS — I5042 Chronic combined systolic (congestive) and diastolic (congestive) heart failure: Secondary | ICD-10-CM | POA: Diagnosis not present

## 2021-11-07 DIAGNOSIS — A419 Sepsis, unspecified organism: Secondary | ICD-10-CM | POA: Diagnosis not present

## 2021-11-07 DIAGNOSIS — R0602 Shortness of breath: Secondary | ICD-10-CM | POA: Diagnosis not present

## 2021-11-18 DIAGNOSIS — R2681 Unsteadiness on feet: Secondary | ICD-10-CM | POA: Diagnosis not present

## 2021-11-18 DIAGNOSIS — N183 Chronic kidney disease, stage 3 unspecified: Secondary | ICD-10-CM | POA: Diagnosis not present

## 2021-11-18 DIAGNOSIS — M6281 Muscle weakness (generalized): Secondary | ICD-10-CM | POA: Diagnosis not present

## 2021-11-19 DIAGNOSIS — E119 Type 2 diabetes mellitus without complications: Secondary | ICD-10-CM | POA: Diagnosis not present

## 2021-11-19 DIAGNOSIS — G9340 Encephalopathy, unspecified: Secondary | ICD-10-CM | POA: Diagnosis not present

## 2021-11-19 DIAGNOSIS — N183 Chronic kidney disease, stage 3 unspecified: Secondary | ICD-10-CM | POA: Diagnosis not present

## 2021-11-19 DIAGNOSIS — R042 Hemoptysis: Secondary | ICD-10-CM | POA: Diagnosis not present

## 2021-11-19 DIAGNOSIS — R2681 Unsteadiness on feet: Secondary | ICD-10-CM | POA: Diagnosis not present

## 2021-11-19 DIAGNOSIS — I4891 Unspecified atrial fibrillation: Secondary | ICD-10-CM | POA: Diagnosis not present

## 2021-11-19 DIAGNOSIS — M6281 Muscle weakness (generalized): Secondary | ICD-10-CM | POA: Diagnosis not present

## 2021-11-19 DIAGNOSIS — A419 Sepsis, unspecified organism: Secondary | ICD-10-CM | POA: Diagnosis not present

## 2021-11-19 DIAGNOSIS — U099 Post covid-19 condition, unspecified: Secondary | ICD-10-CM | POA: Diagnosis not present

## 2021-11-19 DIAGNOSIS — I5042 Chronic combined systolic (congestive) and diastolic (congestive) heart failure: Secondary | ICD-10-CM | POA: Diagnosis not present

## 2021-11-19 DIAGNOSIS — R0602 Shortness of breath: Secondary | ICD-10-CM | POA: Diagnosis not present

## 2021-11-20 DIAGNOSIS — R2681 Unsteadiness on feet: Secondary | ICD-10-CM | POA: Diagnosis not present

## 2021-11-20 DIAGNOSIS — M6281 Muscle weakness (generalized): Secondary | ICD-10-CM | POA: Diagnosis not present

## 2021-11-20 DIAGNOSIS — N183 Chronic kidney disease, stage 3 unspecified: Secondary | ICD-10-CM | POA: Diagnosis not present

## 2021-11-21 DIAGNOSIS — N183 Chronic kidney disease, stage 3 unspecified: Secondary | ICD-10-CM | POA: Diagnosis not present

## 2021-11-21 DIAGNOSIS — M6281 Muscle weakness (generalized): Secondary | ICD-10-CM | POA: Diagnosis not present

## 2021-11-21 DIAGNOSIS — R2681 Unsteadiness on feet: Secondary | ICD-10-CM | POA: Diagnosis not present

## 2021-11-22 DIAGNOSIS — N183 Chronic kidney disease, stage 3 unspecified: Secondary | ICD-10-CM | POA: Diagnosis not present

## 2021-11-22 DIAGNOSIS — R2681 Unsteadiness on feet: Secondary | ICD-10-CM | POA: Diagnosis not present

## 2021-11-22 DIAGNOSIS — M6281 Muscle weakness (generalized): Secondary | ICD-10-CM | POA: Diagnosis not present

## 2021-11-25 DIAGNOSIS — M6281 Muscle weakness (generalized): Secondary | ICD-10-CM | POA: Diagnosis not present

## 2021-11-25 DIAGNOSIS — N183 Chronic kidney disease, stage 3 unspecified: Secondary | ICD-10-CM | POA: Diagnosis not present

## 2021-11-25 DIAGNOSIS — R2681 Unsteadiness on feet: Secondary | ICD-10-CM | POA: Diagnosis not present

## 2021-11-26 DIAGNOSIS — M6281 Muscle weakness (generalized): Secondary | ICD-10-CM | POA: Diagnosis not present

## 2021-11-26 DIAGNOSIS — N183 Chronic kidney disease, stage 3 unspecified: Secondary | ICD-10-CM | POA: Diagnosis not present

## 2021-11-26 DIAGNOSIS — R2681 Unsteadiness on feet: Secondary | ICD-10-CM | POA: Diagnosis not present

## 2021-11-28 DIAGNOSIS — F4322 Adjustment disorder with anxiety: Secondary | ICD-10-CM | POA: Diagnosis not present

## 2021-11-28 DIAGNOSIS — M6281 Muscle weakness (generalized): Secondary | ICD-10-CM | POA: Diagnosis not present

## 2021-11-28 DIAGNOSIS — N183 Chronic kidney disease, stage 3 unspecified: Secondary | ICD-10-CM | POA: Diagnosis not present

## 2021-11-28 DIAGNOSIS — R2681 Unsteadiness on feet: Secondary | ICD-10-CM | POA: Diagnosis not present

## 2021-11-29 DIAGNOSIS — E119 Type 2 diabetes mellitus without complications: Secondary | ICD-10-CM | POA: Diagnosis not present

## 2021-11-29 DIAGNOSIS — A419 Sepsis, unspecified organism: Secondary | ICD-10-CM | POA: Diagnosis not present

## 2021-11-29 DIAGNOSIS — M6281 Muscle weakness (generalized): Secondary | ICD-10-CM | POA: Diagnosis not present

## 2021-11-29 DIAGNOSIS — U099 Post covid-19 condition, unspecified: Secondary | ICD-10-CM | POA: Diagnosis not present

## 2021-11-29 DIAGNOSIS — N183 Chronic kidney disease, stage 3 unspecified: Secondary | ICD-10-CM | POA: Diagnosis not present

## 2021-11-29 DIAGNOSIS — I5042 Chronic combined systolic (congestive) and diastolic (congestive) heart failure: Secondary | ICD-10-CM | POA: Diagnosis not present

## 2021-11-29 DIAGNOSIS — R2681 Unsteadiness on feet: Secondary | ICD-10-CM | POA: Diagnosis not present

## 2021-11-29 DIAGNOSIS — G9340 Encephalopathy, unspecified: Secondary | ICD-10-CM | POA: Diagnosis not present

## 2021-11-29 DIAGNOSIS — R042 Hemoptysis: Secondary | ICD-10-CM | POA: Diagnosis not present

## 2021-11-29 DIAGNOSIS — I4891 Unspecified atrial fibrillation: Secondary | ICD-10-CM | POA: Diagnosis not present

## 2021-11-29 DIAGNOSIS — R0602 Shortness of breath: Secondary | ICD-10-CM | POA: Diagnosis not present

## 2021-12-02 DIAGNOSIS — N183 Chronic kidney disease, stage 3 unspecified: Secondary | ICD-10-CM | POA: Diagnosis not present

## 2021-12-02 DIAGNOSIS — M6281 Muscle weakness (generalized): Secondary | ICD-10-CM | POA: Diagnosis not present

## 2021-12-02 DIAGNOSIS — R2681 Unsteadiness on feet: Secondary | ICD-10-CM | POA: Diagnosis not present

## 2021-12-03 DIAGNOSIS — Z7984 Long term (current) use of oral hypoglycemic drugs: Secondary | ICD-10-CM | POA: Diagnosis not present

## 2021-12-03 DIAGNOSIS — B351 Tinea unguium: Secondary | ICD-10-CM | POA: Diagnosis not present

## 2021-12-03 DIAGNOSIS — E1151 Type 2 diabetes mellitus with diabetic peripheral angiopathy without gangrene: Secondary | ICD-10-CM | POA: Diagnosis not present

## 2021-12-03 DIAGNOSIS — M6281 Muscle weakness (generalized): Secondary | ICD-10-CM | POA: Diagnosis not present

## 2021-12-03 DIAGNOSIS — N183 Chronic kidney disease, stage 3 unspecified: Secondary | ICD-10-CM | POA: Diagnosis not present

## 2021-12-03 DIAGNOSIS — R2681 Unsteadiness on feet: Secondary | ICD-10-CM | POA: Diagnosis not present

## 2021-12-04 DIAGNOSIS — R2681 Unsteadiness on feet: Secondary | ICD-10-CM | POA: Diagnosis not present

## 2021-12-04 DIAGNOSIS — M6281 Muscle weakness (generalized): Secondary | ICD-10-CM | POA: Diagnosis not present

## 2021-12-04 DIAGNOSIS — N183 Chronic kidney disease, stage 3 unspecified: Secondary | ICD-10-CM | POA: Diagnosis not present

## 2021-12-09 DIAGNOSIS — E119 Type 2 diabetes mellitus without complications: Secondary | ICD-10-CM | POA: Diagnosis not present

## 2021-12-09 DIAGNOSIS — R042 Hemoptysis: Secondary | ICD-10-CM | POA: Diagnosis not present

## 2021-12-09 DIAGNOSIS — M6281 Muscle weakness (generalized): Secondary | ICD-10-CM | POA: Diagnosis not present

## 2021-12-09 DIAGNOSIS — A419 Sepsis, unspecified organism: Secondary | ICD-10-CM | POA: Diagnosis not present

## 2021-12-09 DIAGNOSIS — I5042 Chronic combined systolic (congestive) and diastolic (congestive) heart failure: Secondary | ICD-10-CM | POA: Diagnosis not present

## 2021-12-09 DIAGNOSIS — G9340 Encephalopathy, unspecified: Secondary | ICD-10-CM | POA: Diagnosis not present

## 2021-12-09 DIAGNOSIS — R0602 Shortness of breath: Secondary | ICD-10-CM | POA: Diagnosis not present

## 2021-12-09 DIAGNOSIS — I4891 Unspecified atrial fibrillation: Secondary | ICD-10-CM | POA: Diagnosis not present

## 2021-12-09 DIAGNOSIS — U099 Post covid-19 condition, unspecified: Secondary | ICD-10-CM | POA: Diagnosis not present

## 2021-12-10 DIAGNOSIS — I2510 Atherosclerotic heart disease of native coronary artery without angina pectoris: Secondary | ICD-10-CM | POA: Diagnosis not present

## 2021-12-10 DIAGNOSIS — J984 Other disorders of lung: Secondary | ICD-10-CM | POA: Diagnosis not present

## 2021-12-10 DIAGNOSIS — I340 Nonrheumatic mitral (valve) insufficiency: Secondary | ICD-10-CM | POA: Diagnosis not present

## 2021-12-10 DIAGNOSIS — K7460 Unspecified cirrhosis of liver: Secondary | ICD-10-CM | POA: Diagnosis not present

## 2021-12-10 DIAGNOSIS — I5042 Chronic combined systolic (congestive) and diastolic (congestive) heart failure: Secondary | ICD-10-CM | POA: Diagnosis not present

## 2021-12-10 DIAGNOSIS — N1832 Chronic kidney disease, stage 3b: Secondary | ICD-10-CM | POA: Diagnosis not present

## 2021-12-10 DIAGNOSIS — E1122 Type 2 diabetes mellitus with diabetic chronic kidney disease: Secondary | ICD-10-CM | POA: Diagnosis not present

## 2021-12-10 DIAGNOSIS — I4892 Unspecified atrial flutter: Secondary | ICD-10-CM | POA: Diagnosis not present

## 2021-12-10 DIAGNOSIS — E1151 Type 2 diabetes mellitus with diabetic peripheral angiopathy without gangrene: Secondary | ICD-10-CM | POA: Diagnosis not present

## 2021-12-11 DIAGNOSIS — Z23 Encounter for immunization: Secondary | ICD-10-CM | POA: Diagnosis not present

## 2021-12-31 DIAGNOSIS — E7800 Pure hypercholesterolemia, unspecified: Secondary | ICD-10-CM | POA: Diagnosis not present

## 2022-01-13 DIAGNOSIS — Z7185 Encounter for immunization safety counseling: Secondary | ICD-10-CM | POA: Diagnosis not present

## 2022-01-13 DIAGNOSIS — Z23 Encounter for immunization: Secondary | ICD-10-CM | POA: Diagnosis not present

## 2022-01-13 NOTE — Progress Notes (Signed)
Advanced Heart Failure Clinic Note  ONG:[id number], [name], MD HF Cardioligst: Dr. Haroldine Laws  HPI: [name] is an 88 y.o.male with  DM2, HTN, HL, mild renal insufficiency, RAS s/p stenting of L renal artery in 2008, CAD s/p multiple PCIs.  Admitted 4/22 with left shoulder pain -> NSTEMI. Echo EF 45-50%, HK of basal mid anteroseptal wall. Cath  multivessel CAD with 85% proximal RCA, 85% mid RCA, 90% distal RCA, 80% mid LAD, 80% OM 2 lesion. Staged PCI with DES to the mid LAD, balloon angioplasty of left circumflex lesion, the diffusely diseased RCA was treated medically.  4/22 Zio - Sinus with 1AVB occasional Wenckebach and transient junctional rhythm   Admitted 1/23 with COVID. Echo EF 45 - 50% global HK. RV normal.  Mod-sev MR (previously mild).   Admitted 3/23 with ADHF  Echo EF 45-50%, RV mildly reduced, PASP 67.4 mmHg, moderate to severe MR, severe TR, mild to moderate AI. Also with epistaxis requiring anterior nasal packing. EP consulted due to Mobitz Type 1 with 3.4 second pauses. Recommended conservative management with no nodal blocking agents..   Seen in ED 07/22/21 with fall, hit head. CT head negative. Follow up 4/23, volume up, REDs  41%. Torsemide increased to 40 bid x 2 days, then 60 mg daily.   Follow up 6/23, NYHA II-early III, volume stable on torsemide 40 mg. Qtc > 600 on ECG and SSRI stopped.  Today he returns for HF follow up. Overall feeling fine. Lives at [facility]. He walks with a walker some and is not SOB with this. Uses WC for going further distances. Continues on 2L oxygen continuously. Denies palpitations, abnormal bleeding, CP, dizziness, edema, or PND/Orthopnea. Appetite ok, has outside food brought to facility to him. No fever or chills. Weight at facility 157 pounds. Taking all medications provided by facility. Per facility list, has been getting Jardiance every other day. No recent falls.  ROS: All systems reviewed and negative except as per HPI.   Past  Medical History:  Diagnosis Date   Anemia due to chronic kidney disease    Bilateral inguinal hernia    CAD (coronary artery disease) cardiologist-  dr bensimhon   PTCA of OM in 1998, [place] in 2000, Cath 9/07 LM ok LAD ok. LCX 95% in OM prior to previous stent RCA. nondominant normal EF normal. Cypher DES to OM 2007 (PLACED PROXIAMAL TO PREVIOUS STENT)   Chronic kidney disease, stage III (moderate) ([place])    nephrologist-  dr detarding   Depression    Diabetes mellitus type 2, diet-controlled ([place])    followed by pcp,  last A1c 5.2 on 09-10-2017 in epic   Gout, unspecified    10-29-2017  per pt stable   HLD (hyperlipidemia)    HTN (hypertension)    MGUS (monoclonal gammopathy of unknown significance) previously followed by dr Beryle Beams , [facility] Clement and released 08/01/2011   IgG kappa dx 2002 8% plasma cells in bone marrow; no lesions on bone X-rays;   Nocturia    OA (osteoarthritis)    OSA on CPAP    per study 01-30-2004  severe osa , AHI 51.6/hr   PAD (peripheral artery disease) ([place])    05-21-2006  left renal artery stenosis, s/p balloon angioplasty and stenting;  last duplex 02/ 2012  normal , arteries patent   S/P coronary artery stent placement    2000--  BMS x1  to OM;   2007-- DES x1  to OM proximal to previous stent  Secondary hyperparathyroidism of renal origin [facility])    Urgency of urination    Wears glasses     Current Outpatient Medications  Medication Sig Dispense Refill   ACCU-CHEK SOFTCLIX LANCETS lancets Use to test blood sugar daily. Dx:E11.8 300 each 3   acetaminophen (TYLENOL) 500 MG tablet Take 1,000 mg by mouth every 8 (eight) hours as needed for moderate pain.     Alcohol Swabs (B-D SINGLE USE SWABS REGULAR) PADS Use with testing of blood sugar. Dx:E11.8 300 each 3   allopurinol (ZYLOPRIM) 100 MG tablet Take 1 tablet (100 mg total) by mouth daily. 90 tablet 3   apixaban (ELIQUIS) 2.5 MG TABS tablet Take 1 tablet (2.5 mg total) by mouth 2 (two) times daily. 60 tablet     aspirin EC 81 MG tablet Take 81 mg by mouth daily. Swallow whole.     atorvastatin (LIPITOR) 80 MG tablet Take 1 tablet (80 mg total) by mouth daily. 90 tablet 3   diphenhydrAMINE (BENADRYL) 25 MG tablet Take 25 mg by mouth 2 (two) times daily as needed for itching.     Emollient (EUCERIN) lotion Apply 1 application. topically daily. Apply to rash on  chest, abdomen, back,neck and shoulders     ezetimibe (ZETIA) 10 MG tablet Take 10 mg by mouth daily.     glucose blood (ACCU-CHEK AVIVA PLUS) test strip Use to check blood sugar daily. Dx:E11.8 300 each 3   guaiFENesin (MUCINEX) 600 MG 12 hr tablet Take 600 mg by mouth 2 (two) times daily.     ipratropium-albuterol (DUONEB) 0.5-2.5 (3) MG/3ML SOLN Inhale 3 mLs into the lungs 3 (three) times daily.     levothyroxine (SYNTHROID) 50 MCG tablet Patient takes 1/2 tablet by mouth before breakfast with water only.     lidocaine (LIDODERM) 5 % Place 1 patch onto the skin daily. Remove & Discard patch within 12 hours or as directed by MD (Patient taking differently: Place 1 patch onto the skin See admin instructions. Apply to left knee in the morning,remove at bedtime) 5 patch 0   nitroGLYCERIN (NITROSTAT) 0.4 MG SL tablet DISSOLVE 1 TABLET UNDER THE TONGUE EVERY 5 MINUTES AS  NEEDED FOR CHEST PAIN. MAX  OF 3 TABLETS IN 15 MINUTES. CALL 911 IF PAIN PERSISTS. 75 tablet 1   tamsulosin (FLOMAX) 0.4 MG CAPS capsule Take 0.4 mg by mouth daily.     torsemide (DEMADEX) 20 MG tablet Take 3 tablets (60 mg total) by mouth daily.  3   white petrolatum (VASELINE) GEL Apply 1 application. topically 3 (three) times daily as needed (inside bilateral nares).     Zinc Oxide 25 % PSTE Apply 1 application. topically in the morning and at bedtime. Apply to bottom during incontinence care     No current facility-administered medications for this encounter.   Social History   Tobacco Use   Smoking status: Some Days    Packs/day: 0.25    Types: Cigarettes   Smokeless  tobacco: Never   Tobacco comments:    10-29-2017  per pt was smoking 1pp2d, stopped June 2019  Vaping Use   Vaping Use: Never used  Substance Use Topics   Alcohol use: Not Currently   Drug use: No   Family History  Problem Relation Age of Onset   Heart disease Mother    Healthy Father    Heart disease Sister    Heart disease Sister    Cancer Sister        type unknown  Hypertension Sister    Diabetes Brother    Diabetes Brother    Diabetes Other    Coronary artery disease Other    Cancer Other    Wt Readings from Last 3 Encounters:  01/14/22 74.7 kg (164 lb 9.6 oz)  08/26/21 63.4 kg (139 lb 12.8 oz)  07/31/21 70.8 kg (156 lb)   BP 106/62   Pulse 74   Wt 74.7 kg (164 lb 9.6 oz)   SpO2 100%   BMI 25.03 kg/m   PHYSICAL EXAM: General:  NAD. No resp difficulty, arrived in [facility] on oxygen, elderly HEENT: Normal Neck: Supple. No JVD. Carotids 2+ bilat; no bruits. No lymphadenopathy or thryomegaly appreciated. Cor: PMI nondisplaced. Regular rate & rhythm. No rubs, gallops, 2/6 TR, clear S2 Lungs: Clear Abdomen: Soft, nontender, nondistended. No hepatosplenomegaly. No bruits or masses. Good bowel sounds. Extremities: No cyanosis, clubbing, rash, edema Neuro: Alert & oriented x 3, cranial nerves grossly intact. Moves all 4 extremities w/o difficulty. Affect pleasant.  ReDs: 37%  ASSESSMENT & PLAN: 1. Chronic Diastolic Heart Failure/Ischemic CM - Echo (4/22): EF 45-50%  - Echo (3/23): EF 45-50%, LV mildly down with global LV HK, mild LVH, RV mildly reduced, severely elevated PASP 67.4 mmHg, moderate to severe MR, severe TR, mild to moderate AI. - Improved NYHA II-early III, functional class difficult due to deconditioning. Weight up 25 lbs since last visit, but volume OK on exam. ? Weight gain 2/2 increased appetite. REDs ok 37% - Continue torsemide 60 mg daily. - Continue Jardiance 10 mg daily. Needs to take daily. - No spiro w/ last SCr > 2. - No b-blocker due to HB. -  No ACE/ARB/ARNi with hx angioedema on ace and arb. - Check labs today. Adjust diuretics as needed  2. CAD  - s/p NSTEMI 4/22.  - Cath 4/22 with multivessel CAD with 85% proximal RCA, 85% mid RCA, 90% distal RCA, 80% mid LAD, 80% OM 2 lesion. Staged PCI with DES to the mid LAD, balloon angioplasty of left circumflex lesion, the diffusely diseased RCA was treated medically. - No s/s angina. Plavix stopped due to epistaxis/anemia. - On DOAC with ASA + Eliquis. - Continue statin. - Continue Jardiance.  3. Paroxysmal Atrial Flutter - Continue Eliquis 2.5 mg bid. - CHA2DS2-VASc Score = 5  - 39% burden by zio 09/2020 - Seen by EP during recent admit, not a good candidate for antiarrhythmic drugs. - Regular on exam today.  4. Mobitz 1 second degree HB in setting of NSTEMI 4/22 - No beta blocker. - No current indication for PPM.  5. HTN - Blood pressure well controlled.  - Continue current regimen. - Avoid hypotension  6. Aortic insufficiency/severe TR/moderate MR - Mild AI on echo 4/22 (stable from 2018) - Mild to moderate AI on echo 3/23. - Severe TR on echo 3/23 - Moderate to severe MR on echo 3/23 - Not mTEER candidate currently with lack of symptoms and with frailty/debility. - Clinically stable. Follow.  7. CKD 3b - Baseline Scr ~2.0 - On SGLT2 - He is followed by [facility]. - Avoid hypotension - BMET today.  8.T2DM - On SGLT2i.  - No GU symptoms.  9. Physical deconditioning - Continue PT at [facility]. - Palliative is following at facility.  Follow up in 5-6 months with Dr. Haroldine Laws.  Rafael Bihari, FNP-BC 11:56 AM

## 2022-01-14 ENCOUNTER — Ambulatory Visit (HOSPITAL_COMMUNITY)
Admission: RE | Admit: 2022-01-14 | Discharge: 2022-01-14 | Disposition: A | Discharge status: Home / Self Care | Payer: Medicare HMO | Source: Ambulatory Visit | Attending: Family Medicine | Admitting: Family Medicine

## 2022-01-14 ENCOUNTER — Encounter (HOSPITAL_COMMUNITY): Payer: Self-pay

## 2022-01-14 VITALS — BP 106/62 | HR 74 | Wt 164.6 lb

## 2022-01-14 DIAGNOSIS — I10 Essential (primary) hypertension: Secondary | ICD-10-CM

## 2022-01-14 DIAGNOSIS — I4892 Unspecified atrial flutter: Secondary | ICD-10-CM | POA: Diagnosis not present

## 2022-01-14 DIAGNOSIS — I083 Combined rheumatic disorders of mitral, aortic and tricuspid valves: Secondary | ICD-10-CM | POA: Insufficient documentation

## 2022-01-14 DIAGNOSIS — I130 Hypertensive heart and chronic kidney disease with heart failure and stage 1 through stage 4 chronic kidney disease, or unspecified chronic kidney disease: Secondary | ICD-10-CM | POA: Diagnosis not present

## 2022-01-14 DIAGNOSIS — N183 Chronic kidney disease, stage 3 unspecified: Secondary | ICD-10-CM | POA: Diagnosis not present

## 2022-01-14 DIAGNOSIS — R5381 Other malaise: Secondary | ICD-10-CM

## 2022-01-14 DIAGNOSIS — N1832 Chronic kidney disease, stage 3b: Secondary | ICD-10-CM | POA: Diagnosis not present

## 2022-01-14 DIAGNOSIS — I38 Endocarditis, valve unspecified: Secondary | ICD-10-CM

## 2022-01-14 DIAGNOSIS — Z7984 Long term (current) use of oral hypoglycemic drugs: Secondary | ICD-10-CM | POA: Insufficient documentation

## 2022-01-14 DIAGNOSIS — Z9981 Dependence on supplemental oxygen: Secondary | ICD-10-CM | POA: Insufficient documentation

## 2022-01-14 DIAGNOSIS — I5032 Chronic diastolic (congestive) heart failure: Secondary | ICD-10-CM

## 2022-01-14 DIAGNOSIS — E1122 Type 2 diabetes mellitus with diabetic chronic kidney disease: Secondary | ICD-10-CM | POA: Insufficient documentation

## 2022-01-14 DIAGNOSIS — I441 Atrioventricular block, second degree: Secondary | ICD-10-CM

## 2022-01-14 DIAGNOSIS — E119 Type 2 diabetes mellitus without complications: Secondary | ICD-10-CM | POA: Diagnosis not present

## 2022-01-14 DIAGNOSIS — Z955 Presence of coronary angioplasty implant and graft: Secondary | ICD-10-CM | POA: Diagnosis not present

## 2022-01-14 DIAGNOSIS — Z79899 Other long term (current) drug therapy: Secondary | ICD-10-CM | POA: Diagnosis not present

## 2022-01-14 DIAGNOSIS — I252 Old myocardial infarction: Secondary | ICD-10-CM | POA: Diagnosis not present

## 2022-01-14 DIAGNOSIS — I2510 Atherosclerotic heart disease of native coronary artery without angina pectoris: Secondary | ICD-10-CM

## 2022-01-14 DIAGNOSIS — Z7901 Long term (current) use of anticoagulants: Secondary | ICD-10-CM | POA: Diagnosis not present

## 2022-01-14 DIAGNOSIS — I255 Ischemic cardiomyopathy: Secondary | ICD-10-CM | POA: Diagnosis not present

## 2022-01-14 LAB — BASIC METABOLIC PANEL
Anion gap: 10 (ref 5–15)
BUN: 64 mg/dL — ABNORMAL HIGH (ref 8–23)
CO2: 26 mmol/L (ref 22–32)
Calcium: 9.1 mg/dL (ref 8.9–10.3)
Chloride: 103 mmol/L (ref 98–111)
Creatinine, Ser: 2.28 mg/dL — ABNORMAL HIGH (ref 0.61–1.24)
GFR, Estimated: 27 mL/min — ABNORMAL LOW (ref >60)
Glucose, Bld: 92 mg/dL (ref 70–99)
Potassium: 4 mmol/L (ref 3.5–5.1)
Sodium: 139 mmol/L (ref 135–145)

## 2022-01-14 LAB — BRAIN NATRIURETIC PEPTIDE: B Natriuretic Peptide: 135.3 pg/mL — ABNORMAL HIGH (ref 0.0–100.0)

## 2022-01-14 MED ORDER — EMPAGLIFLOZIN 10 MG PO TABS: 10.0000 mg | ORAL_TABLET | Freq: Every day | ORAL | 12 refills | Status: DC

## 2022-01-14 NOTE — Progress Notes (Signed)
ReDS Vest / Clip - 01/14/22 1100       ReDS Vest / Clip   Station Marker C    Ruler Value 28    ReDS Value Range Moderate volume overload    ReDS Actual Value 37    Anatomical Comments sitting

## 2022-01-14 NOTE — Patient Instructions (Signed)
Instructions below given to patient via SNF orders   Increase Jardiance to 10 mg daily   Labs today We will only contact you if something comes back abnormal or we need to make some changes. Otherwise no news is good news!  Your physician wants you to follow-up in: 5-6 months with Dr Haroldine Laws  You will receive a reminder letter in the mail two months in advance. If you don't receive a letter, please call our office to schedule the follow-up appointment.

## 2022-01-19 DIAGNOSIS — E1159 Type 2 diabetes mellitus with other circulatory complications: Secondary | ICD-10-CM | POA: Diagnosis not present

## 2022-01-21 DIAGNOSIS — I2510 Atherosclerotic heart disease of native coronary artery without angina pectoris: Secondary | ICD-10-CM | POA: Diagnosis not present

## 2022-01-21 DIAGNOSIS — E1151 Type 2 diabetes mellitus with diabetic peripheral angiopathy without gangrene: Secondary | ICD-10-CM | POA: Diagnosis not present

## 2022-01-21 DIAGNOSIS — I340 Nonrheumatic mitral (valve) insufficiency: Secondary | ICD-10-CM | POA: Diagnosis not present

## 2022-01-21 DIAGNOSIS — J984 Other disorders of lung: Secondary | ICD-10-CM | POA: Diagnosis not present

## 2022-01-21 DIAGNOSIS — E1122 Type 2 diabetes mellitus with diabetic chronic kidney disease: Secondary | ICD-10-CM | POA: Diagnosis not present

## 2022-01-21 DIAGNOSIS — F4322 Adjustment disorder with anxiety: Secondary | ICD-10-CM | POA: Diagnosis not present

## 2022-01-21 DIAGNOSIS — I4892 Unspecified atrial flutter: Secondary | ICD-10-CM | POA: Diagnosis not present

## 2022-01-21 DIAGNOSIS — I5042 Chronic combined systolic (congestive) and diastolic (congestive) heart failure: Secondary | ICD-10-CM | POA: Diagnosis not present

## 2022-01-21 DIAGNOSIS — K7460 Unspecified cirrhosis of liver: Secondary | ICD-10-CM | POA: Diagnosis not present

## 2022-01-21 DIAGNOSIS — N1832 Chronic kidney disease, stage 3b: Secondary | ICD-10-CM | POA: Diagnosis not present

## 2022-01-22 DIAGNOSIS — I5043 Acute on chronic combined systolic (congestive) and diastolic (congestive) heart failure: Secondary | ICD-10-CM | POA: Diagnosis not present

## 2022-01-22 DIAGNOSIS — R293 Abnormal posture: Secondary | ICD-10-CM | POA: Diagnosis not present

## 2022-01-23 DIAGNOSIS — I5042 Chronic combined systolic (congestive) and diastolic (congestive) heart failure: Secondary | ICD-10-CM | POA: Diagnosis not present

## 2022-01-23 DIAGNOSIS — R042 Hemoptysis: Secondary | ICD-10-CM | POA: Diagnosis not present

## 2022-01-23 DIAGNOSIS — I5043 Acute on chronic combined systolic (congestive) and diastolic (congestive) heart failure: Secondary | ICD-10-CM | POA: Diagnosis not present

## 2022-01-23 DIAGNOSIS — U099 Post covid-19 condition, unspecified: Secondary | ICD-10-CM | POA: Diagnosis not present

## 2022-01-23 DIAGNOSIS — R293 Abnormal posture: Secondary | ICD-10-CM | POA: Diagnosis not present

## 2022-01-23 DIAGNOSIS — M6281 Muscle weakness (generalized): Secondary | ICD-10-CM | POA: Diagnosis not present

## 2022-01-23 DIAGNOSIS — G9340 Encephalopathy, unspecified: Secondary | ICD-10-CM | POA: Diagnosis not present

## 2022-01-23 DIAGNOSIS — I4891 Unspecified atrial fibrillation: Secondary | ICD-10-CM | POA: Diagnosis not present

## 2022-01-23 DIAGNOSIS — A419 Sepsis, unspecified organism: Secondary | ICD-10-CM | POA: Diagnosis not present

## 2022-01-23 DIAGNOSIS — R0602 Shortness of breath: Secondary | ICD-10-CM | POA: Diagnosis not present

## 2022-01-23 DIAGNOSIS — E119 Type 2 diabetes mellitus without complications: Secondary | ICD-10-CM | POA: Diagnosis not present

## 2022-01-24 DIAGNOSIS — R293 Abnormal posture: Secondary | ICD-10-CM | POA: Diagnosis not present

## 2022-01-24 DIAGNOSIS — I5043 Acute on chronic combined systolic (congestive) and diastolic (congestive) heart failure: Secondary | ICD-10-CM | POA: Diagnosis not present

## 2022-01-27 DIAGNOSIS — R042 Hemoptysis: Secondary | ICD-10-CM | POA: Diagnosis not present

## 2022-01-27 DIAGNOSIS — U099 Post covid-19 condition, unspecified: Secondary | ICD-10-CM | POA: Diagnosis not present

## 2022-01-27 DIAGNOSIS — I4891 Unspecified atrial fibrillation: Secondary | ICD-10-CM | POA: Diagnosis not present

## 2022-01-27 DIAGNOSIS — G9340 Encephalopathy, unspecified: Secondary | ICD-10-CM | POA: Diagnosis not present

## 2022-01-27 DIAGNOSIS — M6281 Muscle weakness (generalized): Secondary | ICD-10-CM | POA: Diagnosis not present

## 2022-01-27 DIAGNOSIS — E119 Type 2 diabetes mellitus without complications: Secondary | ICD-10-CM | POA: Diagnosis not present

## 2022-01-27 DIAGNOSIS — I5043 Acute on chronic combined systolic (congestive) and diastolic (congestive) heart failure: Secondary | ICD-10-CM | POA: Diagnosis not present

## 2022-01-27 DIAGNOSIS — A419 Sepsis, unspecified organism: Secondary | ICD-10-CM | POA: Diagnosis not present

## 2022-01-27 DIAGNOSIS — R293 Abnormal posture: Secondary | ICD-10-CM | POA: Diagnosis not present

## 2022-01-27 DIAGNOSIS — I5042 Chronic combined systolic (congestive) and diastolic (congestive) heart failure: Secondary | ICD-10-CM | POA: Diagnosis not present

## 2022-01-27 DIAGNOSIS — R0602 Shortness of breath: Secondary | ICD-10-CM | POA: Diagnosis not present

## 2022-01-28 DIAGNOSIS — I5043 Acute on chronic combined systolic (congestive) and diastolic (congestive) heart failure: Secondary | ICD-10-CM | POA: Diagnosis not present

## 2022-01-28 DIAGNOSIS — R293 Abnormal posture: Secondary | ICD-10-CM | POA: Diagnosis not present

## 2022-01-29 DIAGNOSIS — I130 Hypertensive heart and chronic kidney disease with heart failure and stage 1 through stage 4 chronic kidney disease, or unspecified chronic kidney disease: Secondary | ICD-10-CM | POA: Diagnosis not present

## 2022-01-29 DIAGNOSIS — Z7984 Long term (current) use of oral hypoglycemic drugs: Secondary | ICD-10-CM | POA: Diagnosis not present

## 2022-01-29 DIAGNOSIS — N1832 Chronic kidney disease, stage 3b: Secondary | ICD-10-CM | POA: Diagnosis not present

## 2022-01-29 DIAGNOSIS — I5042 Chronic combined systolic (congestive) and diastolic (congestive) heart failure: Secondary | ICD-10-CM | POA: Diagnosis not present

## 2022-01-29 DIAGNOSIS — E1122 Type 2 diabetes mellitus with diabetic chronic kidney disease: Secondary | ICD-10-CM | POA: Diagnosis not present

## 2022-01-29 DIAGNOSIS — R293 Abnormal posture: Secondary | ICD-10-CM | POA: Diagnosis not present

## 2022-01-29 DIAGNOSIS — I5043 Acute on chronic combined systolic (congestive) and diastolic (congestive) heart failure: Secondary | ICD-10-CM | POA: Diagnosis not present

## 2022-01-30 DIAGNOSIS — E119 Type 2 diabetes mellitus without complications: Secondary | ICD-10-CM | POA: Diagnosis not present

## 2022-01-30 DIAGNOSIS — A419 Sepsis, unspecified organism: Secondary | ICD-10-CM | POA: Diagnosis not present

## 2022-01-30 DIAGNOSIS — R0602 Shortness of breath: Secondary | ICD-10-CM | POA: Diagnosis not present

## 2022-01-30 DIAGNOSIS — U099 Post covid-19 condition, unspecified: Secondary | ICD-10-CM | POA: Diagnosis not present

## 2022-01-30 DIAGNOSIS — I5043 Acute on chronic combined systolic (congestive) and diastolic (congestive) heart failure: Secondary | ICD-10-CM | POA: Diagnosis not present

## 2022-01-30 DIAGNOSIS — I4891 Unspecified atrial fibrillation: Secondary | ICD-10-CM | POA: Diagnosis not present

## 2022-01-30 DIAGNOSIS — R042 Hemoptysis: Secondary | ICD-10-CM | POA: Diagnosis not present

## 2022-01-30 DIAGNOSIS — M6281 Muscle weakness (generalized): Secondary | ICD-10-CM | POA: Diagnosis not present

## 2022-01-30 DIAGNOSIS — R293 Abnormal posture: Secondary | ICD-10-CM | POA: Diagnosis not present

## 2022-01-30 DIAGNOSIS — G9340 Encephalopathy, unspecified: Secondary | ICD-10-CM | POA: Diagnosis not present

## 2022-01-30 DIAGNOSIS — I5042 Chronic combined systolic (congestive) and diastolic (congestive) heart failure: Secondary | ICD-10-CM | POA: Diagnosis not present

## 2022-01-31 DIAGNOSIS — R293 Abnormal posture: Secondary | ICD-10-CM | POA: Diagnosis not present

## 2022-01-31 DIAGNOSIS — I5043 Acute on chronic combined systolic (congestive) and diastolic (congestive) heart failure: Secondary | ICD-10-CM | POA: Diagnosis not present

## 2022-02-03 DIAGNOSIS — I5043 Acute on chronic combined systolic (congestive) and diastolic (congestive) heart failure: Secondary | ICD-10-CM | POA: Diagnosis not present

## 2022-02-03 DIAGNOSIS — A419 Sepsis, unspecified organism: Secondary | ICD-10-CM | POA: Diagnosis not present

## 2022-02-03 DIAGNOSIS — E119 Type 2 diabetes mellitus without complications: Secondary | ICD-10-CM | POA: Diagnosis not present

## 2022-02-03 DIAGNOSIS — R293 Abnormal posture: Secondary | ICD-10-CM | POA: Diagnosis not present

## 2022-02-03 DIAGNOSIS — R0602 Shortness of breath: Secondary | ICD-10-CM | POA: Diagnosis not present

## 2022-02-03 DIAGNOSIS — G9340 Encephalopathy, unspecified: Secondary | ICD-10-CM | POA: Diagnosis not present

## 2022-02-03 DIAGNOSIS — M6281 Muscle weakness (generalized): Secondary | ICD-10-CM | POA: Diagnosis not present

## 2022-02-03 DIAGNOSIS — I4891 Unspecified atrial fibrillation: Secondary | ICD-10-CM | POA: Diagnosis not present

## 2022-02-03 DIAGNOSIS — I5042 Chronic combined systolic (congestive) and diastolic (congestive) heart failure: Secondary | ICD-10-CM | POA: Diagnosis not present

## 2022-02-03 DIAGNOSIS — U099 Post covid-19 condition, unspecified: Secondary | ICD-10-CM | POA: Diagnosis not present

## 2022-02-03 DIAGNOSIS — R042 Hemoptysis: Secondary | ICD-10-CM | POA: Diagnosis not present

## 2022-02-04 DIAGNOSIS — L603 Nail dystrophy: Secondary | ICD-10-CM | POA: Diagnosis not present

## 2022-02-04 DIAGNOSIS — Z7984 Long term (current) use of oral hypoglycemic drugs: Secondary | ICD-10-CM | POA: Diagnosis not present

## 2022-02-04 DIAGNOSIS — E1151 Type 2 diabetes mellitus with diabetic peripheral angiopathy without gangrene: Secondary | ICD-10-CM | POA: Diagnosis not present

## 2022-02-04 DIAGNOSIS — R293 Abnormal posture: Secondary | ICD-10-CM | POA: Diagnosis not present

## 2022-02-04 DIAGNOSIS — B351 Tinea unguium: Secondary | ICD-10-CM | POA: Diagnosis not present

## 2022-02-04 DIAGNOSIS — I5043 Acute on chronic combined systolic (congestive) and diastolic (congestive) heart failure: Secondary | ICD-10-CM | POA: Diagnosis not present

## 2022-02-05 DIAGNOSIS — R293 Abnormal posture: Secondary | ICD-10-CM | POA: Diagnosis not present

## 2022-02-05 DIAGNOSIS — I5043 Acute on chronic combined systolic (congestive) and diastolic (congestive) heart failure: Secondary | ICD-10-CM | POA: Diagnosis not present

## 2022-02-06 DIAGNOSIS — I5043 Acute on chronic combined systolic (congestive) and diastolic (congestive) heart failure: Secondary | ICD-10-CM | POA: Diagnosis not present

## 2022-02-06 DIAGNOSIS — R293 Abnormal posture: Secondary | ICD-10-CM | POA: Diagnosis not present

## 2022-02-10 DIAGNOSIS — U099 Post covid-19 condition, unspecified: Secondary | ICD-10-CM | POA: Diagnosis not present

## 2022-02-10 DIAGNOSIS — I5043 Acute on chronic combined systolic (congestive) and diastolic (congestive) heart failure: Secondary | ICD-10-CM | POA: Diagnosis not present

## 2022-02-10 DIAGNOSIS — R293 Abnormal posture: Secondary | ICD-10-CM | POA: Diagnosis not present

## 2022-02-10 DIAGNOSIS — M6281 Muscle weakness (generalized): Secondary | ICD-10-CM | POA: Diagnosis not present

## 2022-02-10 DIAGNOSIS — I5042 Chronic combined systolic (congestive) and diastolic (congestive) heart failure: Secondary | ICD-10-CM | POA: Diagnosis not present

## 2022-02-10 DIAGNOSIS — G9340 Encephalopathy, unspecified: Secondary | ICD-10-CM | POA: Diagnosis not present

## 2022-02-10 DIAGNOSIS — R042 Hemoptysis: Secondary | ICD-10-CM | POA: Diagnosis not present

## 2022-02-10 DIAGNOSIS — I4891 Unspecified atrial fibrillation: Secondary | ICD-10-CM | POA: Diagnosis not present

## 2022-02-10 DIAGNOSIS — R0602 Shortness of breath: Secondary | ICD-10-CM | POA: Diagnosis not present

## 2022-02-10 DIAGNOSIS — A419 Sepsis, unspecified organism: Secondary | ICD-10-CM | POA: Diagnosis not present

## 2022-02-10 DIAGNOSIS — E119 Type 2 diabetes mellitus without complications: Secondary | ICD-10-CM | POA: Diagnosis not present

## 2022-02-11 DIAGNOSIS — R293 Abnormal posture: Secondary | ICD-10-CM | POA: Diagnosis not present

## 2022-02-11 DIAGNOSIS — E1122 Type 2 diabetes mellitus with diabetic chronic kidney disease: Secondary | ICD-10-CM | POA: Diagnosis not present

## 2022-02-11 DIAGNOSIS — Z7984 Long term (current) use of oral hypoglycemic drugs: Secondary | ICD-10-CM | POA: Diagnosis not present

## 2022-02-11 DIAGNOSIS — I5043 Acute on chronic combined systolic (congestive) and diastolic (congestive) heart failure: Secondary | ICD-10-CM | POA: Diagnosis not present

## 2022-02-11 DIAGNOSIS — J9610 Chronic respiratory failure, unspecified whether with hypoxia or hypercapnia: Secondary | ICD-10-CM | POA: Diagnosis not present

## 2022-02-11 DIAGNOSIS — N183 Chronic kidney disease, stage 3 unspecified: Secondary | ICD-10-CM | POA: Diagnosis not present

## 2022-02-11 DIAGNOSIS — I130 Hypertensive heart and chronic kidney disease with heart failure and stage 1 through stage 4 chronic kidney disease, or unspecified chronic kidney disease: Secondary | ICD-10-CM | POA: Diagnosis not present

## 2022-02-13 DIAGNOSIS — R293 Abnormal posture: Secondary | ICD-10-CM | POA: Diagnosis not present

## 2022-02-13 DIAGNOSIS — I5043 Acute on chronic combined systolic (congestive) and diastolic (congestive) heart failure: Secondary | ICD-10-CM | POA: Diagnosis not present

## 2022-02-17 DIAGNOSIS — I5043 Acute on chronic combined systolic (congestive) and diastolic (congestive) heart failure: Secondary | ICD-10-CM | POA: Diagnosis not present

## 2022-02-17 DIAGNOSIS — R293 Abnormal posture: Secondary | ICD-10-CM | POA: Diagnosis not present

## 2022-02-18 DIAGNOSIS — F4322 Adjustment disorder with anxiety: Secondary | ICD-10-CM | POA: Diagnosis not present

## 2022-02-18 DIAGNOSIS — R293 Abnormal posture: Secondary | ICD-10-CM | POA: Diagnosis not present

## 2022-02-18 DIAGNOSIS — I5043 Acute on chronic combined systolic (congestive) and diastolic (congestive) heart failure: Secondary | ICD-10-CM | POA: Diagnosis not present

## 2022-02-19 DIAGNOSIS — R293 Abnormal posture: Secondary | ICD-10-CM | POA: Diagnosis not present

## 2022-02-19 DIAGNOSIS — I5043 Acute on chronic combined systolic (congestive) and diastolic (congestive) heart failure: Secondary | ICD-10-CM | POA: Diagnosis not present

## 2022-02-20 DIAGNOSIS — R040 Epistaxis: Secondary | ICD-10-CM | POA: Diagnosis not present

## 2022-02-20 DIAGNOSIS — I5042 Chronic combined systolic (congestive) and diastolic (congestive) heart failure: Secondary | ICD-10-CM | POA: Diagnosis not present

## 2022-02-20 DIAGNOSIS — I5043 Acute on chronic combined systolic (congestive) and diastolic (congestive) heart failure: Secondary | ICD-10-CM | POA: Diagnosis not present

## 2022-02-20 DIAGNOSIS — R293 Abnormal posture: Secondary | ICD-10-CM | POA: Diagnosis not present

## 2022-02-20 DIAGNOSIS — Z9981 Dependence on supplemental oxygen: Secondary | ICD-10-CM | POA: Diagnosis not present

## 2022-02-24 ENCOUNTER — Emergency Department (HOSPITAL_COMMUNITY)
Admission: EM | Admit: 2022-02-24 | Discharge: 2022-02-24 | Disposition: A | Discharge status: Skilled Nursing Facility | Payer: Medicare HMO | Attending: Emergency Medicine | Admitting: Emergency Medicine

## 2022-02-24 DIAGNOSIS — I2510 Atherosclerotic heart disease of native coronary artery without angina pectoris: Secondary | ICD-10-CM | POA: Diagnosis not present

## 2022-02-24 DIAGNOSIS — Z7982 Long term (current) use of aspirin: Secondary | ICD-10-CM | POA: Diagnosis not present

## 2022-02-24 DIAGNOSIS — E1122 Type 2 diabetes mellitus with diabetic chronic kidney disease: Secondary | ICD-10-CM | POA: Diagnosis not present

## 2022-02-24 DIAGNOSIS — I959 Hypotension, unspecified: Secondary | ICD-10-CM | POA: Diagnosis not present

## 2022-02-24 DIAGNOSIS — R040 Epistaxis: Secondary | ICD-10-CM | POA: Diagnosis not present

## 2022-02-24 DIAGNOSIS — Z79899 Other long term (current) drug therapy: Secondary | ICD-10-CM | POA: Insufficient documentation

## 2022-02-24 DIAGNOSIS — I129 Hypertensive chronic kidney disease with stage 1 through stage 4 chronic kidney disease, or unspecified chronic kidney disease: Secondary | ICD-10-CM | POA: Insufficient documentation

## 2022-02-24 DIAGNOSIS — Z955 Presence of coronary angioplasty implant and graft: Secondary | ICD-10-CM | POA: Insufficient documentation

## 2022-02-24 DIAGNOSIS — I4891 Unspecified atrial fibrillation: Secondary | ICD-10-CM | POA: Diagnosis not present

## 2022-02-24 DIAGNOSIS — R58 Hemorrhage, not elsewhere classified: Secondary | ICD-10-CM | POA: Diagnosis not present

## 2022-02-24 DIAGNOSIS — Z7984 Long term (current) use of oral hypoglycemic drugs: Secondary | ICD-10-CM | POA: Insufficient documentation

## 2022-02-24 DIAGNOSIS — N183 Chronic kidney disease, stage 3 unspecified: Secondary | ICD-10-CM | POA: Diagnosis not present

## 2022-02-24 DIAGNOSIS — Z7901 Long term (current) use of anticoagulants: Secondary | ICD-10-CM | POA: Insufficient documentation

## 2022-02-24 DIAGNOSIS — Z7401 Bed confinement status: Secondary | ICD-10-CM | POA: Diagnosis not present

## 2022-02-24 DIAGNOSIS — N189 Chronic kidney disease, unspecified: Secondary | ICD-10-CM | POA: Diagnosis not present

## 2022-02-24 DIAGNOSIS — K7460 Unspecified cirrhosis of liver: Secondary | ICD-10-CM | POA: Diagnosis not present

## 2022-02-24 NOTE — ED Provider Notes (Signed)
[facility] EMERGENCY DEPARTMENT Provider Note   CSN: [id number] Arrival date & time: 02/24/22  1011     History  Chief Complaint  Patient presents with   Epistaxis    Kevin Schwartz is a 88 y.o. male.  88 year old male with history of hyperlipidemia, hypertension, MGUS, CAD with stents, CKD, diabetes on Eliquis presents with concern for left-sided nosebleed onset around 7 AM this morning at facility.  Facility applied Afrin without control of bleeding, then applied a tampon to the left nostril.  Bleeding was controlled upon EMS arrival.  EMS transported patient without any further bleeding.  Patient states he does get frequent nosebleeds.  Denies any injury to the nose.  No other complaints or concerns today.       Home Medications Prior to Admission medications   Medication Sig Start Date End Date Taking? Authorizing Provider  ACCU-CHEK SOFTCLIX LANCETS lancets Use to test blood sugar daily. Dx:E11.8 03/25/17   Reed, Tiffany L, DO  acetaminophen (TYLENOL) 500 MG tablet Take 1,000 mg by mouth every 8 (eight) hours as needed for moderate pain.    [provider]  Alcohol Swabs (B-D SINGLE USE SWABS REGULAR) PADS Use with testing of blood sugar. Dx:E11.8 03/25/17   Reed, Tiffany L, DO  allopurinol (ZYLOPRIM) 100 MG tablet Take 1 tablet (100 mg total) by mouth daily. 01/31/21   [name], MD  apixaban (ELIQUIS) 2.5 MG TABS tablet Take 1 tablet (2.5 mg total) by mouth 2 (two) times daily. 04/28/21   Elgergawy, Silver Huguenin, MD  aspirin EC 81 MG tablet Take 81 mg by mouth daily. Swallow whole.    [provider]  atorvastatin (LIPITOR) 80 MG tablet Take 1 tablet (80 mg total) by mouth daily. 01/31/21 08/01/22  Ngetich, Dinah C, NP  diphenhydrAMINE (BENADRYL) 25 MG tablet Take 25 mg by mouth 2 (two) times daily as needed for itching.    [provider]  Emollient (EUCERIN) lotion Apply 1 application. topically daily. Apply to rash  on  chest, abdomen, back,neck and shoulders    [provider]  empagliflozin (JARDIANCE) 10 MG TABS tablet Take 1 tablet (10 mg total) by mouth daily. Patient takes 1 tablet by mouth every other day. 01/14/22   Milford, Maricela Bo, FNP  ezetimibe (ZETIA) 10 MG tablet Take 10 mg by mouth daily.    [provider]  glucose blood (ACCU-CHEK AVIVA PLUS) test strip Use to check blood sugar daily. Dx:E11.8 09/05/19   Reed, Tiffany L, DO  guaiFENesin (MUCINEX) 600 MG 12 hr tablet Take 600 mg by mouth 2 (two) times daily.    [provider]  ipratropium-albuterol (DUONEB) 0.5-2.5 (3) MG/3ML SOLN Inhale 3 mLs into the lungs 3 (three) times daily. 06/17/21   [provider]  levothyroxine (SYNTHROID) 50 MCG tablet Patient takes 1/2 tablet by mouth before breakfast with water only.    [provider]  lidocaine (LIDODERM) 5 % Place 1 patch onto the skin daily. Remove & Discard patch within 12 hours or as directed by MD Patient taking differently: Place 1 patch onto the skin See admin instructions. Apply to left knee in the morning,remove at bedtime 04/01/20   Maudie Flakes, MD  nitroGLYCERIN (NITROSTAT) 0.4 MG SL tablet DISSOLVE 1 TABLET UNDER THE TONGUE EVERY 5 MINUTES AS  NEEDED FOR CHEST PAIN. MAX  OF 3 TABLETS IN 15 MINUTES. CALL 911 IF PAIN PERSISTS. 11/08/20   [name], MD  tamsulosin (FLOMAX) 0.4 MG  CAPS capsule Take 0.4 mg by mouth daily. 06/24/21   [provider]  torsemide (DEMADEX) 20 MG tablet Take 3 tablets (60 mg total) by mouth daily. 07/31/21   Milford, Maricela Bo, FNP  white petrolatum (VASELINE) GEL Apply 1 application. topically 3 (three) times daily as needed (inside bilateral nares).    [provider]  Zinc Oxide 25 % PSTE Apply 1 application. topically in the morning and at bedtime. Apply to bottom during incontinence care    [provider]      Allergies    Lisinopril, Losartan potassium, Crestor  [rosuvastatin], and Tape    Review of Systems   Review of Systems Negative except as per HPI Physical Exam Updated Vital Signs BP 127/65 (BP Location: Right Arm)   Pulse 60   Temp (!) 97.5 F (36.4 C) (Oral)   Resp 12   Ht '5\' 8"'$  (1.727 m)   Wt 74.7 kg   SpO2 99%   BMI 25.04 kg/m  Physical Exam Vitals and nursing note reviewed.  Constitutional:      General: He is not in acute distress.    Appearance: He is well-developed. He is not diaphoretic.  HENT:     Head: Normocephalic and atraumatic.     Nose:     Comments: Dried blood in left nostril, no active bleeding.     Mouth/Throat:     Mouth: Mucous membranes are moist.  Eyes:     Conjunctiva/sclera: Conjunctivae normal.  Pulmonary:     Effort: Pulmonary effort is normal.  Musculoskeletal:     Cervical back: Neck supple.  Skin:    General: Skin is warm and dry.     Findings: No erythema or rash.  Neurological:     Mental Status: He is alert and oriented to person, place, and time.  Psychiatric:        Behavior: Behavior normal.     ED Results / Procedures / Treatments   Labs (all labs ordered are listed, but only abnormal results are displayed) Labs Reviewed - No data to display  EKG None  Radiology No results found.  Procedures Procedures    Medications Ordered in ED Medications - No data to display  ED Course/ Medical Decision Making/ A&P                           Medical Decision Making  88 year old male on Eliquis brought in by EMS from skilled nurse facility after nosebleed earlier today.  Afrin was applied at the facility and then a tampon placed in the left nostril.  Patient arrives the emergency room with bleeding controlled, scant amount of blood on the nasal tampon.  Patient was observed in the emergency department, no further bleeding.  Patient is discharged with referral to ENT for follow-up if needed.  If bleeding returns, can apply Afrin and hold pressure.        Final Clinical  Impression(s) / ED Diagnoses Final diagnoses:  Left-sided epistaxis    Rx / DC Orders ED Discharge Orders     None         Tacy Learn, PA-C 02/24/22 1051    Audley Hose, MD 02/24/22 1213

## 2022-02-24 NOTE — ED Notes (Signed)
[facility] called; next on list.

## 2022-02-24 NOTE — ED Triage Notes (Addendum)
Pt BIB EMS from SNF after left nare nosebleed started '@0730'$ . Pt on Eliquis. Nose was not bleeding anymore upon EMS arrival, SNF gave Afran before their arrival. Aox4.

## 2022-02-24 NOTE — ED Notes (Signed)
[facility] at bedside to talke pt home. No nose bleed episodes noted since arrival in ED.

## 2022-02-24 NOTE — ED Notes (Signed)
Attempted to call report to [facility] facility, called three times with no answer. Reception took number to have them call.

## 2022-02-24 NOTE — Discharge Instructions (Addendum)
If bleeding returns, spray Afrin in both nostrils and pinch the soft part of the nose shut. Hold CONSTANT pressure for 15 minutes. If bleeding continues, call return to ER if needed.  Follow up with ENT, call to schedule an appointment.

## 2022-02-28 DIAGNOSIS — K7460 Unspecified cirrhosis of liver: Secondary | ICD-10-CM | POA: Diagnosis not present

## 2022-02-28 DIAGNOSIS — R040 Epistaxis: Secondary | ICD-10-CM | POA: Diagnosis not present

## 2022-02-28 DIAGNOSIS — I4892 Unspecified atrial flutter: Secondary | ICD-10-CM | POA: Diagnosis not present

## 2022-02-28 DIAGNOSIS — Z7902 Long term (current) use of antithrombotics/antiplatelets: Secondary | ICD-10-CM | POA: Diagnosis not present

## 2022-02-28 DIAGNOSIS — D472 Monoclonal gammopathy: Secondary | ICD-10-CM | POA: Diagnosis not present

## 2022-02-28 DIAGNOSIS — Z9981 Dependence on supplemental oxygen: Secondary | ICD-10-CM | POA: Diagnosis not present

## 2022-02-28 DIAGNOSIS — Z7901 Long term (current) use of anticoagulants: Secondary | ICD-10-CM | POA: Diagnosis not present

## 2022-03-03 ENCOUNTER — Telehealth: Payer: Self-pay | Admitting: *Deleted

## 2022-03-03 NOTE — Telephone Encounter (Signed)
     Patient  visit on 02/24/2022  at [facility]  was for nose bleed   Have you been able to follow up with your primary care physician? Patient is feeling better and was able to see the dr and get his medications  The patient was  able to obtain any needed medicine or equipment.  Are there diet recommendations that you are having difficulty following?  Patient expresses understanding of discharge instructions and education provided has no other needs at this time. yes  [facility] [phone] [address] , [place] [address] Email : [email or web address]

## 2022-03-04 DIAGNOSIS — I5020 Unspecified systolic (congestive) heart failure: Secondary | ICD-10-CM | POA: Diagnosis not present

## 2022-03-04 DIAGNOSIS — Z029 Encounter for administrative examinations, unspecified: Secondary | ICD-10-CM | POA: Diagnosis not present

## 2022-03-11 DIAGNOSIS — F4322 Adjustment disorder with anxiety: Secondary | ICD-10-CM | POA: Diagnosis not present

## 2022-03-11 DIAGNOSIS — F4321 Adjustment disorder with depressed mood: Secondary | ICD-10-CM | POA: Diagnosis not present

## 2022-03-12 DIAGNOSIS — I4891 Unspecified atrial fibrillation: Secondary | ICD-10-CM | POA: Diagnosis not present

## 2022-03-12 DIAGNOSIS — M6281 Muscle weakness (generalized): Secondary | ICD-10-CM | POA: Diagnosis not present

## 2022-03-12 DIAGNOSIS — E119 Type 2 diabetes mellitus without complications: Secondary | ICD-10-CM | POA: Diagnosis not present

## 2022-03-12 DIAGNOSIS — R52 Pain, unspecified: Secondary | ICD-10-CM | POA: Diagnosis not present

## 2022-03-12 DIAGNOSIS — M6250 Muscle wasting and atrophy, not elsewhere classified, unspecified site: Secondary | ICD-10-CM | POA: Diagnosis not present

## 2022-03-12 DIAGNOSIS — I119 Hypertensive heart disease without heart failure: Secondary | ICD-10-CM | POA: Diagnosis not present

## 2022-03-12 DIAGNOSIS — R5381 Other malaise: Secondary | ICD-10-CM | POA: Diagnosis not present

## 2022-03-13 DIAGNOSIS — M25512 Pain in left shoulder: Secondary | ICD-10-CM | POA: Diagnosis not present

## 2022-03-13 DIAGNOSIS — N189 Chronic kidney disease, unspecified: Secondary | ICD-10-CM | POA: Diagnosis not present

## 2022-03-13 DIAGNOSIS — I129 Hypertensive chronic kidney disease with stage 1 through stage 4 chronic kidney disease, or unspecified chronic kidney disease: Secondary | ICD-10-CM | POA: Diagnosis not present

## 2022-03-13 DIAGNOSIS — Z7984 Long term (current) use of oral hypoglycemic drugs: Secondary | ICD-10-CM | POA: Diagnosis not present

## 2022-03-13 DIAGNOSIS — E1122 Type 2 diabetes mellitus with diabetic chronic kidney disease: Secondary | ICD-10-CM | POA: Diagnosis not present

## 2022-03-14 ENCOUNTER — Emergency Department (HOSPITAL_COMMUNITY)
Admission: EM | Admit: 2022-03-14 | Discharge: 2022-03-14 | Disposition: A | Discharge status: Skilled Nursing Facility | Payer: Medicare HMO | Attending: Emergency Medicine | Admitting: Emergency Medicine

## 2022-03-14 DIAGNOSIS — Z7901 Long term (current) use of anticoagulants: Secondary | ICD-10-CM | POA: Insufficient documentation

## 2022-03-14 DIAGNOSIS — Z7401 Bed confinement status: Secondary | ICD-10-CM | POA: Diagnosis not present

## 2022-03-14 DIAGNOSIS — I959 Hypotension, unspecified: Secondary | ICD-10-CM | POA: Diagnosis not present

## 2022-03-14 DIAGNOSIS — Z7902 Long term (current) use of antithrombotics/antiplatelets: Secondary | ICD-10-CM | POA: Diagnosis not present

## 2022-03-14 DIAGNOSIS — R040 Epistaxis: Secondary | ICD-10-CM | POA: Diagnosis not present

## 2022-03-14 DIAGNOSIS — Z9981 Dependence on supplemental oxygen: Secondary | ICD-10-CM | POA: Diagnosis not present

## 2022-03-14 DIAGNOSIS — R58 Hemorrhage, not elsewhere classified: Secondary | ICD-10-CM | POA: Diagnosis not present

## 2022-03-14 DIAGNOSIS — I10 Essential (primary) hypertension: Secondary | ICD-10-CM | POA: Diagnosis not present

## 2022-03-14 DIAGNOSIS — I4891 Unspecified atrial fibrillation: Secondary | ICD-10-CM | POA: Diagnosis not present

## 2022-03-14 DIAGNOSIS — Z7982 Long term (current) use of aspirin: Secondary | ICD-10-CM | POA: Insufficient documentation

## 2022-03-14 DIAGNOSIS — J984 Other disorders of lung: Secondary | ICD-10-CM | POA: Diagnosis not present

## 2022-03-14 DIAGNOSIS — R531 Weakness: Secondary | ICD-10-CM | POA: Diagnosis not present

## 2022-03-14 LAB — CBC WITH DIFFERENTIAL/PLATELET
Abs Immature Granulocytes: 0.04 10*3/uL (ref 0.00–0.07)
Basophils Absolute: 0 10*3/uL (ref 0.0–0.1)
Basophils Relative: 1 %
Eosinophils Absolute: 0.8 10*3/uL — ABNORMAL HIGH (ref 0.0–0.5)
Eosinophils Relative: 11 %
HCT: 29.7 % — ABNORMAL LOW (ref 39.0–52.0)
Hemoglobin: 9.8 g/dL — ABNORMAL LOW (ref 13.0–17.0)
Immature Granulocytes: 1 %
Lymphocytes Relative: 18 %
Lymphs Abs: 1.4 10*3/uL (ref 0.7–4.0)
MCH: 31.7 pg (ref 26.0–34.0)
MCHC: 33 g/dL (ref 30.0–36.0)
MCV: 96.1 fL (ref 80.0–100.0)
Monocytes Absolute: 0.9 10*3/uL (ref 0.1–1.0)
Monocytes Relative: 11 %
Neutro Abs: 4.6 10*3/uL (ref 1.7–7.7)
Neutrophils Relative %: 58 %
Platelets: 384 10*3/uL (ref 150–400)
RBC: 3.09 MIL/uL — ABNORMAL LOW (ref 4.22–5.81)
RDW: 14.3 % (ref 11.5–15.5)
WBC: 7.8 10*3/uL (ref 4.0–10.5)
nRBC: 0 % (ref 0.0–0.2)

## 2022-03-14 LAB — BASIC METABOLIC PANEL
Anion gap: 9 (ref 5–15)
BUN: 73 mg/dL — ABNORMAL HIGH (ref 8–23)
CO2: 25 mmol/L (ref 22–32)
Calcium: 9.2 mg/dL (ref 8.9–10.3)
Chloride: 102 mmol/L (ref 98–111)
Creatinine, Ser: 2.54 mg/dL — ABNORMAL HIGH (ref 0.61–1.24)
GFR, Estimated: 24 mL/min — ABNORMAL LOW (ref >60)
Glucose, Bld: 103 mg/dL — ABNORMAL HIGH (ref 70–99)
Potassium: 4.3 mmol/L (ref 3.5–5.1)
Sodium: 136 mmol/L (ref 135–145)

## 2022-03-14 NOTE — ED Notes (Signed)
Called ptar for patient ,  not eta given

## 2022-03-14 NOTE — ED Triage Notes (Signed)
Pt arrives to ED via EMS from [facility] c/o nosebleed since 3PM on 11/30. Pt is on blood thinners, hx of nosebleeds. No other c/o at this time. Pt given one dose of Afrin yesterday. CBG 121. Pt with 188/90 BP, HR 107 per EMS

## 2022-03-14 NOTE — ED Provider Notes (Signed)
[facility] EMERGENCY DEPARTMENT Provider Note   CSN: [id number] Arrival date & time: 03/14/22  6811     History  Chief Complaint  Patient presents with   Epistaxis    Kevin Schwartz is a 88 y.o. male.  Patient presents to the emergency department for evaluation of nosebleed.  Patient reports he has been having bleeding from the left side of his nose which has been present since 3 PM yesterday.  Patient is on Eliquis secondary to history of atrial flutter.       Home Medications Prior to Admission medications   Medication Sig Start Date End Date Taking? Authorizing Provider  ACCU-CHEK SOFTCLIX LANCETS lancets Use to test blood sugar daily. Dx:E11.8 03/25/17   Reed, Tiffany L, DO  acetaminophen (TYLENOL) 500 MG tablet Take 1,000 mg by mouth every 8 (eight) hours as needed for moderate pain.    [provider]  Alcohol Swabs (B-D SINGLE USE SWABS REGULAR) PADS Use with testing of blood sugar. Dx:E11.8 03/25/17   Reed, Tiffany L, DO  allopurinol (ZYLOPRIM) 100 MG tablet Take 1 tablet (100 mg total) by mouth daily. 01/31/21   [name], MD  apixaban (ELIQUIS) 2.5 MG TABS tablet Take 1 tablet (2.5 mg total) by mouth 2 (two) times daily. 04/28/21   Elgergawy, Silver Huguenin, MD  aspirin EC 81 MG tablet Take 81 mg by mouth daily. Swallow whole.    [provider]  atorvastatin (LIPITOR) 80 MG tablet Take 1 tablet (80 mg total) by mouth daily. 01/31/21 08/01/22  Ngetich, Dinah C, NP  diphenhydrAMINE (BENADRYL) 25 MG tablet Take 25 mg by mouth 2 (two) times daily as needed for itching.    [provider]  Emollient (EUCERIN) lotion Apply 1 application. topically daily. Apply to rash on  chest, abdomen, back,neck and shoulders    [provider]  empagliflozin (JARDIANCE) 10 MG TABS tablet Take 1 tablet (10 mg total) by mouth daily. Patient takes 1 tablet by mouth every other day. 01/14/22   Milford, Maricela Bo, FNP  ezetimibe  (ZETIA) 10 MG tablet Take 10 mg by mouth daily.    [provider]  glucose blood (ACCU-CHEK AVIVA PLUS) test strip Use to check blood sugar daily. Dx:E11.8 09/05/19   Reed, Tiffany L, DO  guaiFENesin (MUCINEX) 600 MG 12 hr tablet Take 600 mg by mouth 2 (two) times daily.    [provider]  ipratropium-albuterol (DUONEB) 0.5-2.5 (3) MG/3ML SOLN Inhale 3 mLs into the lungs 3 (three) times daily. 06/17/21   [provider]  levothyroxine (SYNTHROID) 50 MCG tablet Patient takes 1/2 tablet by mouth before breakfast with water only.    [provider]  lidocaine (LIDODERM) 5 % Place 1 patch onto the skin daily. Remove & Discard patch within 12 hours or as directed by MD Patient taking differently: Place 1 patch onto the skin See admin instructions. Apply to left knee in the morning,remove at bedtime 04/01/20   Maudie Flakes, MD  nitroGLYCERIN (NITROSTAT) 0.4 MG SL tablet DISSOLVE 1 TABLET UNDER THE TONGUE EVERY 5 MINUTES AS  NEEDED FOR CHEST PAIN. MAX  OF 3 TABLETS IN 15 MINUTES. CALL 911 IF PAIN PERSISTS. 11/08/20   [name], MD  tamsulosin (FLOMAX) 0.4 MG CAPS capsule Take 0.4 mg by mouth daily. 06/24/21   [provider]  torsemide (DEMADEX) 20 MG tablet Take 3 tablets (60 mg total) by mouth daily. 07/31/21   Rafael Bihari, FNP  white  petrolatum (VASELINE) GEL Apply 1 application. topically 3 (three) times daily as needed (inside bilateral nares).    [provider]  Zinc Oxide 25 % PSTE Apply 1 application. topically in the morning and at bedtime. Apply to bottom during incontinence care    [provider]      Allergies    Lisinopril, Losartan potassium, Crestor [rosuvastatin], and Tape    Review of Systems   Review of Systems  Physical Exam Updated Vital Signs BP (!) 124/57   Pulse (!) 54   Temp 97.8 F (36.6 C) (Oral)   Resp 18   Ht '5\' 8"'$  (1.727 m)   Wt 81 kg   SpO2 95%   BMI 27.14 kg/m  Physical Exam Vitals  and nursing note reviewed.  Constitutional:      General: He is not in acute distress.    Appearance: He is well-developed.  HENT:     Head: Normocephalic and atraumatic.     Nose:     Left Nostril: Epistaxis present.     Mouth/Throat:     Mouth: Mucous membranes are moist.  Eyes:     General: Vision grossly intact. Gaze aligned appropriately.     Extraocular Movements: Extraocular movements intact.     Conjunctiva/sclera: Conjunctivae normal.  Cardiovascular:     Rate and Rhythm: Normal rate and regular rhythm.     Pulses: Normal pulses.     Heart sounds: Normal heart sounds, S1 normal and S2 normal. No murmur heard.    No friction rub. No gallop.  Pulmonary:     Effort: Pulmonary effort is normal. No respiratory distress.     Breath sounds: Normal breath sounds.  Abdominal:     Palpations: Abdomen is soft.     Tenderness: There is no abdominal tenderness. There is no guarding or rebound.     Hernia: No hernia is present.  Musculoskeletal:        General: No swelling.     Cervical back: Full passive range of motion without pain, normal range of motion and neck supple. No pain with movement, spinous process tenderness or muscular tenderness. Normal range of motion.     Right lower leg: No edema.     Left lower leg: No edema.  Skin:    General: Skin is warm and dry.     Capillary Refill: Capillary refill takes less than 2 seconds.     Findings: No ecchymosis, erythema, lesion or wound.  Neurological:     Mental Status: He is alert and oriented to person, place, and time.     GCS: GCS eye subscore is 4. GCS verbal subscore is 5. GCS motor subscore is 6.     Cranial Nerves: Cranial nerves 2-12 are intact.     Sensory: Sensation is intact.     Motor: Motor function is intact. No weakness or abnormal muscle tone.     Coordination: Coordination is intact.  Psychiatric:        Mood and Affect: Mood normal.        Speech: Speech normal.        Behavior: Behavior normal.      ED Results / Procedures / Treatments   Labs (all labs ordered are listed, but only abnormal results are displayed) Labs Reviewed  CBC WITH DIFFERENTIAL/PLATELET - Abnormal; Notable for the following components:      Result Value   RBC 3.09 (*)    Hemoglobin 9.8 (*)    HCT 29.7 (*)  Eosinophils Absolute 0.8 (*)    All other components within normal limits  BASIC METABOLIC PANEL    EKG None  Radiology No results found.  Procedures .Epistaxis Management  Date/Time: 03/14/2022 6:39 AM  Performed by: Orpah Greek, MD Authorized by: Orpah Greek, MD   Consent:    Consent obtained:  Verbal   Consent given by:  Patient   Risks, benefits, and alternatives were discussed: yes     Risks discussed:  Pain Universal protocol:    Procedure explained and questions answered to patient or proxy's satisfaction: yes     Site/side marked: yes     Immediately prior to procedure, a time out was called: yes     Patient identity confirmed:  Verbally with patient Anesthesia:    Anesthesia method:  None Procedure details:    Treatment site:  L anterior   Treatment method:  Nasal balloon   Treatment complexity:  Limited   Treatment episode: recurring   Post-procedure details:    Assessment:  Bleeding stopped   Procedure completion:  Tolerated well, no immediate complications     Medications Ordered in ED Medications - No data to display  ED Course/ Medical Decision Making/ A&P                           Medical Decision Making Amount and/or Complexity of Data Reviewed External Data Reviewed: labs and notes. Labs: ordered.   Presents for evaluation of left-sided nosebleed.  Reviewing his records, patient was seen a couple of weeks ago with similar symptoms.  This is therefore a recurrent bleed.  He was given Afrin yesterday with some slowing of the bleeding but it never really stopped.  At arrival to the ED is still bleeding, not profusely.  Based on his  Eliquis use and recurrent nature of the bleed, recommended packing.  Discussed this with patient and he did consent to the procedure which was performed without difficulty.  No further bleeding present after packing.        Final Clinical Impression(s) / ED Diagnoses Final diagnoses:  Left-sided epistaxis    Rx / DC Orders ED Discharge Orders     None         Orpah Greek, MD 03/14/22 [phone]

## 2022-03-14 NOTE — ED Notes (Signed)
Attempted to call [facility] for report, no answer.

## 2022-03-14 NOTE — Discharge Instructions (Addendum)
Do not take Eliquis today. Restart as normal tomorrow.  Call [phone] to schedule appointment with ENT for packing removal next week

## 2022-03-19 DIAGNOSIS — R040 Epistaxis: Secondary | ICD-10-CM | POA: Diagnosis not present

## 2022-03-19 DIAGNOSIS — Z7901 Long term (current) use of anticoagulants: Secondary | ICD-10-CM | POA: Diagnosis not present

## 2022-04-01 DIAGNOSIS — H5789 Other specified disorders of eye and adnexa: Secondary | ICD-10-CM | POA: Diagnosis not present

## 2022-04-10 DIAGNOSIS — I272 Pulmonary hypertension, unspecified: Secondary | ICD-10-CM | POA: Diagnosis not present

## 2022-04-10 DIAGNOSIS — E1122 Type 2 diabetes mellitus with diabetic chronic kidney disease: Secondary | ICD-10-CM | POA: Diagnosis not present

## 2022-04-10 DIAGNOSIS — I4892 Unspecified atrial flutter: Secondary | ICD-10-CM | POA: Diagnosis not present

## 2022-04-10 DIAGNOSIS — E1151 Type 2 diabetes mellitus with diabetic peripheral angiopathy without gangrene: Secondary | ICD-10-CM | POA: Diagnosis not present

## 2022-04-10 DIAGNOSIS — N184 Chronic kidney disease, stage 4 (severe): Secondary | ICD-10-CM | POA: Diagnosis not present

## 2022-04-10 DIAGNOSIS — R040 Epistaxis: Secondary | ICD-10-CM | POA: Diagnosis not present

## 2022-04-10 DIAGNOSIS — E039 Hypothyroidism, unspecified: Secondary | ICD-10-CM | POA: Diagnosis not present

## 2022-04-10 DIAGNOSIS — I5042 Chronic combined systolic (congestive) and diastolic (congestive) heart failure: Secondary | ICD-10-CM | POA: Diagnosis not present

## 2022-04-10 DIAGNOSIS — I2510 Atherosclerotic heart disease of native coronary artery without angina pectoris: Secondary | ICD-10-CM | POA: Diagnosis not present

## 2022-04-16 DIAGNOSIS — R1312 Dysphagia, oropharyngeal phase: Secondary | ICD-10-CM | POA: Diagnosis not present

## 2022-04-16 DIAGNOSIS — M6281 Muscle weakness (generalized): Secondary | ICD-10-CM | POA: Diagnosis not present

## 2022-04-16 DIAGNOSIS — Z9181 History of falling: Secondary | ICD-10-CM | POA: Diagnosis not present

## 2022-04-16 DIAGNOSIS — R2689 Other abnormalities of gait and mobility: Secondary | ICD-10-CM | POA: Diagnosis not present

## 2022-04-16 DIAGNOSIS — J9601 Acute respiratory failure with hypoxia: Secondary | ICD-10-CM | POA: Diagnosis not present

## 2022-04-16 DIAGNOSIS — R1311 Dysphagia, oral phase: Secondary | ICD-10-CM | POA: Diagnosis not present

## 2022-04-16 DIAGNOSIS — I5043 Acute on chronic combined systolic (congestive) and diastolic (congestive) heart failure: Secondary | ICD-10-CM | POA: Diagnosis not present

## 2022-04-17 DIAGNOSIS — R1311 Dysphagia, oral phase: Secondary | ICD-10-CM | POA: Diagnosis not present

## 2022-04-17 DIAGNOSIS — Z7984 Long term (current) use of oral hypoglycemic drugs: Secondary | ICD-10-CM | POA: Diagnosis not present

## 2022-04-17 DIAGNOSIS — M6281 Muscle weakness (generalized): Secondary | ICD-10-CM | POA: Diagnosis not present

## 2022-04-17 DIAGNOSIS — M2041 Other hammer toe(s) (acquired), right foot: Secondary | ICD-10-CM | POA: Diagnosis not present

## 2022-04-17 DIAGNOSIS — Z9181 History of falling: Secondary | ICD-10-CM | POA: Diagnosis not present

## 2022-04-17 DIAGNOSIS — R2689 Other abnormalities of gait and mobility: Secondary | ICD-10-CM | POA: Diagnosis not present

## 2022-04-17 DIAGNOSIS — R1312 Dysphagia, oropharyngeal phase: Secondary | ICD-10-CM | POA: Diagnosis not present

## 2022-04-17 DIAGNOSIS — J9601 Acute respiratory failure with hypoxia: Secondary | ICD-10-CM | POA: Diagnosis not present

## 2022-04-17 DIAGNOSIS — I5043 Acute on chronic combined systolic (congestive) and diastolic (congestive) heart failure: Secondary | ICD-10-CM | POA: Diagnosis not present

## 2022-04-17 DIAGNOSIS — L603 Nail dystrophy: Secondary | ICD-10-CM | POA: Diagnosis not present

## 2022-04-17 DIAGNOSIS — E1151 Type 2 diabetes mellitus with diabetic peripheral angiopathy without gangrene: Secondary | ICD-10-CM | POA: Diagnosis not present

## 2022-04-17 DIAGNOSIS — M2042 Other hammer toe(s) (acquired), left foot: Secondary | ICD-10-CM | POA: Diagnosis not present

## 2022-04-18 DIAGNOSIS — R1311 Dysphagia, oral phase: Secondary | ICD-10-CM | POA: Diagnosis not present

## 2022-04-18 DIAGNOSIS — R1312 Dysphagia, oropharyngeal phase: Secondary | ICD-10-CM | POA: Diagnosis not present

## 2022-04-18 DIAGNOSIS — J9601 Acute respiratory failure with hypoxia: Secondary | ICD-10-CM | POA: Diagnosis not present

## 2022-04-18 DIAGNOSIS — Z9181 History of falling: Secondary | ICD-10-CM | POA: Diagnosis not present

## 2022-04-18 DIAGNOSIS — M6281 Muscle weakness (generalized): Secondary | ICD-10-CM | POA: Diagnosis not present

## 2022-04-18 DIAGNOSIS — R2689 Other abnormalities of gait and mobility: Secondary | ICD-10-CM | POA: Diagnosis not present

## 2022-04-18 DIAGNOSIS — I5043 Acute on chronic combined systolic (congestive) and diastolic (congestive) heart failure: Secondary | ICD-10-CM | POA: Diagnosis not present

## 2022-04-19 DIAGNOSIS — J9601 Acute respiratory failure with hypoxia: Secondary | ICD-10-CM | POA: Diagnosis not present

## 2022-04-19 DIAGNOSIS — R1311 Dysphagia, oral phase: Secondary | ICD-10-CM | POA: Diagnosis not present

## 2022-04-19 DIAGNOSIS — I5043 Acute on chronic combined systolic (congestive) and diastolic (congestive) heart failure: Secondary | ICD-10-CM | POA: Diagnosis not present

## 2022-04-19 DIAGNOSIS — M6281 Muscle weakness (generalized): Secondary | ICD-10-CM | POA: Diagnosis not present

## 2022-04-19 DIAGNOSIS — R1312 Dysphagia, oropharyngeal phase: Secondary | ICD-10-CM | POA: Diagnosis not present

## 2022-04-19 DIAGNOSIS — Z9181 History of falling: Secondary | ICD-10-CM | POA: Diagnosis not present

## 2022-04-19 DIAGNOSIS — R2689 Other abnormalities of gait and mobility: Secondary | ICD-10-CM | POA: Diagnosis not present

## 2022-04-21 DIAGNOSIS — Z9181 History of falling: Secondary | ICD-10-CM | POA: Diagnosis not present

## 2022-04-21 DIAGNOSIS — R2689 Other abnormalities of gait and mobility: Secondary | ICD-10-CM | POA: Diagnosis not present

## 2022-04-21 DIAGNOSIS — J9601 Acute respiratory failure with hypoxia: Secondary | ICD-10-CM | POA: Diagnosis not present

## 2022-04-21 DIAGNOSIS — R1312 Dysphagia, oropharyngeal phase: Secondary | ICD-10-CM | POA: Diagnosis not present

## 2022-04-21 DIAGNOSIS — M6281 Muscle weakness (generalized): Secondary | ICD-10-CM | POA: Diagnosis not present

## 2022-04-21 DIAGNOSIS — R1311 Dysphagia, oral phase: Secondary | ICD-10-CM | POA: Diagnosis not present

## 2022-04-21 DIAGNOSIS — I5043 Acute on chronic combined systolic (congestive) and diastolic (congestive) heart failure: Secondary | ICD-10-CM | POA: Diagnosis not present

## 2022-04-22 DIAGNOSIS — M6250 Muscle wasting and atrophy, not elsewhere classified, unspecified site: Secondary | ICD-10-CM | POA: Diagnosis not present

## 2022-04-22 DIAGNOSIS — I119 Hypertensive heart disease without heart failure: Secondary | ICD-10-CM | POA: Diagnosis not present

## 2022-04-22 DIAGNOSIS — R5381 Other malaise: Secondary | ICD-10-CM | POA: Diagnosis not present

## 2022-04-22 DIAGNOSIS — R52 Pain, unspecified: Secondary | ICD-10-CM | POA: Diagnosis not present

## 2022-04-22 DIAGNOSIS — R2689 Other abnormalities of gait and mobility: Secondary | ICD-10-CM | POA: Diagnosis not present

## 2022-04-22 DIAGNOSIS — I5043 Acute on chronic combined systolic (congestive) and diastolic (congestive) heart failure: Secondary | ICD-10-CM | POA: Diagnosis not present

## 2022-04-22 DIAGNOSIS — Z9181 History of falling: Secondary | ICD-10-CM | POA: Diagnosis not present

## 2022-04-22 DIAGNOSIS — R1311 Dysphagia, oral phase: Secondary | ICD-10-CM | POA: Diagnosis not present

## 2022-04-22 DIAGNOSIS — I4891 Unspecified atrial fibrillation: Secondary | ICD-10-CM | POA: Diagnosis not present

## 2022-04-22 DIAGNOSIS — J9601 Acute respiratory failure with hypoxia: Secondary | ICD-10-CM | POA: Diagnosis not present

## 2022-04-22 DIAGNOSIS — M6281 Muscle weakness (generalized): Secondary | ICD-10-CM | POA: Diagnosis not present

## 2022-04-22 DIAGNOSIS — R1312 Dysphagia, oropharyngeal phase: Secondary | ICD-10-CM | POA: Diagnosis not present

## 2022-04-24 DIAGNOSIS — R1312 Dysphagia, oropharyngeal phase: Secondary | ICD-10-CM | POA: Diagnosis not present

## 2022-04-24 DIAGNOSIS — I5043 Acute on chronic combined systolic (congestive) and diastolic (congestive) heart failure: Secondary | ICD-10-CM | POA: Diagnosis not present

## 2022-04-24 DIAGNOSIS — Z9181 History of falling: Secondary | ICD-10-CM | POA: Diagnosis not present

## 2022-04-24 DIAGNOSIS — J9601 Acute respiratory failure with hypoxia: Secondary | ICD-10-CM | POA: Diagnosis not present

## 2022-04-24 DIAGNOSIS — M6281 Muscle weakness (generalized): Secondary | ICD-10-CM | POA: Diagnosis not present

## 2022-04-24 DIAGNOSIS — R2689 Other abnormalities of gait and mobility: Secondary | ICD-10-CM | POA: Diagnosis not present

## 2022-04-24 DIAGNOSIS — R1311 Dysphagia, oral phase: Secondary | ICD-10-CM | POA: Diagnosis not present

## 2022-04-25 DIAGNOSIS — J9601 Acute respiratory failure with hypoxia: Secondary | ICD-10-CM | POA: Diagnosis not present

## 2022-04-25 DIAGNOSIS — R1312 Dysphagia, oropharyngeal phase: Secondary | ICD-10-CM | POA: Diagnosis not present

## 2022-04-25 DIAGNOSIS — R2689 Other abnormalities of gait and mobility: Secondary | ICD-10-CM | POA: Diagnosis not present

## 2022-04-25 DIAGNOSIS — M6281 Muscle weakness (generalized): Secondary | ICD-10-CM | POA: Diagnosis not present

## 2022-04-25 DIAGNOSIS — I5043 Acute on chronic combined systolic (congestive) and diastolic (congestive) heart failure: Secondary | ICD-10-CM | POA: Diagnosis not present

## 2022-04-25 DIAGNOSIS — R1311 Dysphagia, oral phase: Secondary | ICD-10-CM | POA: Diagnosis not present

## 2022-04-25 DIAGNOSIS — Z9181 History of falling: Secondary | ICD-10-CM | POA: Diagnosis not present

## 2022-04-26 DIAGNOSIS — R1312 Dysphagia, oropharyngeal phase: Secondary | ICD-10-CM | POA: Diagnosis not present

## 2022-04-26 DIAGNOSIS — J9601 Acute respiratory failure with hypoxia: Secondary | ICD-10-CM | POA: Diagnosis not present

## 2022-04-26 DIAGNOSIS — R2689 Other abnormalities of gait and mobility: Secondary | ICD-10-CM | POA: Diagnosis not present

## 2022-04-26 DIAGNOSIS — Z9181 History of falling: Secondary | ICD-10-CM | POA: Diagnosis not present

## 2022-04-26 DIAGNOSIS — R1311 Dysphagia, oral phase: Secondary | ICD-10-CM | POA: Diagnosis not present

## 2022-04-26 DIAGNOSIS — M6281 Muscle weakness (generalized): Secondary | ICD-10-CM | POA: Diagnosis not present

## 2022-04-26 DIAGNOSIS — I5043 Acute on chronic combined systolic (congestive) and diastolic (congestive) heart failure: Secondary | ICD-10-CM | POA: Diagnosis not present

## 2022-04-28 DIAGNOSIS — R1312 Dysphagia, oropharyngeal phase: Secondary | ICD-10-CM | POA: Diagnosis not present

## 2022-04-28 DIAGNOSIS — Z9181 History of falling: Secondary | ICD-10-CM | POA: Diagnosis not present

## 2022-04-28 DIAGNOSIS — R5381 Other malaise: Secondary | ICD-10-CM | POA: Diagnosis not present

## 2022-04-28 DIAGNOSIS — I4891 Unspecified atrial fibrillation: Secondary | ICD-10-CM | POA: Diagnosis not present

## 2022-04-28 DIAGNOSIS — M6250 Muscle wasting and atrophy, not elsewhere classified, unspecified site: Secondary | ICD-10-CM | POA: Diagnosis not present

## 2022-04-28 DIAGNOSIS — R2689 Other abnormalities of gait and mobility: Secondary | ICD-10-CM | POA: Diagnosis not present

## 2022-04-28 DIAGNOSIS — J9601 Acute respiratory failure with hypoxia: Secondary | ICD-10-CM | POA: Diagnosis not present

## 2022-04-28 DIAGNOSIS — R1311 Dysphagia, oral phase: Secondary | ICD-10-CM | POA: Diagnosis not present

## 2022-04-28 DIAGNOSIS — I5043 Acute on chronic combined systolic (congestive) and diastolic (congestive) heart failure: Secondary | ICD-10-CM | POA: Diagnosis not present

## 2022-04-28 DIAGNOSIS — M6281 Muscle weakness (generalized): Secondary | ICD-10-CM | POA: Diagnosis not present

## 2022-04-28 DIAGNOSIS — R52 Pain, unspecified: Secondary | ICD-10-CM | POA: Diagnosis not present

## 2022-04-28 DIAGNOSIS — I119 Hypertensive heart disease without heart failure: Secondary | ICD-10-CM | POA: Diagnosis not present

## 2022-04-29 DIAGNOSIS — R1311 Dysphagia, oral phase: Secondary | ICD-10-CM | POA: Diagnosis not present

## 2022-04-29 DIAGNOSIS — R2689 Other abnormalities of gait and mobility: Secondary | ICD-10-CM | POA: Diagnosis not present

## 2022-04-29 DIAGNOSIS — R1312 Dysphagia, oropharyngeal phase: Secondary | ICD-10-CM | POA: Diagnosis not present

## 2022-04-29 DIAGNOSIS — I5043 Acute on chronic combined systolic (congestive) and diastolic (congestive) heart failure: Secondary | ICD-10-CM | POA: Diagnosis not present

## 2022-04-29 DIAGNOSIS — Z9181 History of falling: Secondary | ICD-10-CM | POA: Diagnosis not present

## 2022-04-29 DIAGNOSIS — J9601 Acute respiratory failure with hypoxia: Secondary | ICD-10-CM | POA: Diagnosis not present

## 2022-04-29 DIAGNOSIS — M6281 Muscle weakness (generalized): Secondary | ICD-10-CM | POA: Diagnosis not present

## 2022-04-30 DIAGNOSIS — Z9181 History of falling: Secondary | ICD-10-CM | POA: Diagnosis not present

## 2022-04-30 DIAGNOSIS — M6281 Muscle weakness (generalized): Secondary | ICD-10-CM | POA: Diagnosis not present

## 2022-04-30 DIAGNOSIS — R1311 Dysphagia, oral phase: Secondary | ICD-10-CM | POA: Diagnosis not present

## 2022-04-30 DIAGNOSIS — R1312 Dysphagia, oropharyngeal phase: Secondary | ICD-10-CM | POA: Diagnosis not present

## 2022-04-30 DIAGNOSIS — R2689 Other abnormalities of gait and mobility: Secondary | ICD-10-CM | POA: Diagnosis not present

## 2022-04-30 DIAGNOSIS — I5043 Acute on chronic combined systolic (congestive) and diastolic (congestive) heart failure: Secondary | ICD-10-CM | POA: Diagnosis not present

## 2022-04-30 DIAGNOSIS — J9601 Acute respiratory failure with hypoxia: Secondary | ICD-10-CM | POA: Diagnosis not present

## 2022-05-01 DIAGNOSIS — R1312 Dysphagia, oropharyngeal phase: Secondary | ICD-10-CM | POA: Diagnosis not present

## 2022-05-01 DIAGNOSIS — R2689 Other abnormalities of gait and mobility: Secondary | ICD-10-CM | POA: Diagnosis not present

## 2022-05-01 DIAGNOSIS — Z9181 History of falling: Secondary | ICD-10-CM | POA: Diagnosis not present

## 2022-05-01 DIAGNOSIS — M6281 Muscle weakness (generalized): Secondary | ICD-10-CM | POA: Diagnosis not present

## 2022-05-01 DIAGNOSIS — J9601 Acute respiratory failure with hypoxia: Secondary | ICD-10-CM | POA: Diagnosis not present

## 2022-05-01 DIAGNOSIS — R1311 Dysphagia, oral phase: Secondary | ICD-10-CM | POA: Diagnosis not present

## 2022-05-01 DIAGNOSIS — I5043 Acute on chronic combined systolic (congestive) and diastolic (congestive) heart failure: Secondary | ICD-10-CM | POA: Diagnosis not present

## 2022-05-02 DIAGNOSIS — R2689 Other abnormalities of gait and mobility: Secondary | ICD-10-CM | POA: Diagnosis not present

## 2022-05-02 DIAGNOSIS — I5043 Acute on chronic combined systolic (congestive) and diastolic (congestive) heart failure: Secondary | ICD-10-CM | POA: Diagnosis not present

## 2022-05-02 DIAGNOSIS — J9601 Acute respiratory failure with hypoxia: Secondary | ICD-10-CM | POA: Diagnosis not present

## 2022-05-02 DIAGNOSIS — M6281 Muscle weakness (generalized): Secondary | ICD-10-CM | POA: Diagnosis not present

## 2022-05-02 DIAGNOSIS — R1311 Dysphagia, oral phase: Secondary | ICD-10-CM | POA: Diagnosis not present

## 2022-05-02 DIAGNOSIS — Z9181 History of falling: Secondary | ICD-10-CM | POA: Diagnosis not present

## 2022-05-02 DIAGNOSIS — R1312 Dysphagia, oropharyngeal phase: Secondary | ICD-10-CM | POA: Diagnosis not present

## 2022-05-05 DIAGNOSIS — I5043 Acute on chronic combined systolic (congestive) and diastolic (congestive) heart failure: Secondary | ICD-10-CM | POA: Diagnosis not present

## 2022-05-05 DIAGNOSIS — R2689 Other abnormalities of gait and mobility: Secondary | ICD-10-CM | POA: Diagnosis not present

## 2022-05-05 DIAGNOSIS — Z9181 History of falling: Secondary | ICD-10-CM | POA: Diagnosis not present

## 2022-05-05 DIAGNOSIS — M6281 Muscle weakness (generalized): Secondary | ICD-10-CM | POA: Diagnosis not present

## 2022-05-05 DIAGNOSIS — R1312 Dysphagia, oropharyngeal phase: Secondary | ICD-10-CM | POA: Diagnosis not present

## 2022-05-05 DIAGNOSIS — R1311 Dysphagia, oral phase: Secondary | ICD-10-CM | POA: Diagnosis not present

## 2022-05-05 DIAGNOSIS — J9601 Acute respiratory failure with hypoxia: Secondary | ICD-10-CM | POA: Diagnosis not present

## 2022-05-06 DIAGNOSIS — I5043 Acute on chronic combined systolic (congestive) and diastolic (congestive) heart failure: Secondary | ICD-10-CM | POA: Diagnosis not present

## 2022-05-06 DIAGNOSIS — R1312 Dysphagia, oropharyngeal phase: Secondary | ICD-10-CM | POA: Diagnosis not present

## 2022-05-06 DIAGNOSIS — J9601 Acute respiratory failure with hypoxia: Secondary | ICD-10-CM | POA: Diagnosis not present

## 2022-05-06 DIAGNOSIS — R2689 Other abnormalities of gait and mobility: Secondary | ICD-10-CM | POA: Diagnosis not present

## 2022-05-06 DIAGNOSIS — Z9181 History of falling: Secondary | ICD-10-CM | POA: Diagnosis not present

## 2022-05-06 DIAGNOSIS — R1311 Dysphagia, oral phase: Secondary | ICD-10-CM | POA: Diagnosis not present

## 2022-05-06 DIAGNOSIS — M6281 Muscle weakness (generalized): Secondary | ICD-10-CM | POA: Diagnosis not present

## 2022-05-07 DIAGNOSIS — J9601 Acute respiratory failure with hypoxia: Secondary | ICD-10-CM | POA: Diagnosis not present

## 2022-05-07 DIAGNOSIS — R5381 Other malaise: Secondary | ICD-10-CM | POA: Diagnosis not present

## 2022-05-07 DIAGNOSIS — R52 Pain, unspecified: Secondary | ICD-10-CM | POA: Diagnosis not present

## 2022-05-07 DIAGNOSIS — M6250 Muscle wasting and atrophy, not elsewhere classified, unspecified site: Secondary | ICD-10-CM | POA: Diagnosis not present

## 2022-05-07 DIAGNOSIS — M6281 Muscle weakness (generalized): Secondary | ICD-10-CM | POA: Diagnosis not present

## 2022-05-07 DIAGNOSIS — R1311 Dysphagia, oral phase: Secondary | ICD-10-CM | POA: Diagnosis not present

## 2022-05-07 DIAGNOSIS — I4891 Unspecified atrial fibrillation: Secondary | ICD-10-CM | POA: Diagnosis not present

## 2022-05-07 DIAGNOSIS — I119 Hypertensive heart disease without heart failure: Secondary | ICD-10-CM | POA: Diagnosis not present

## 2022-05-07 DIAGNOSIS — R2689 Other abnormalities of gait and mobility: Secondary | ICD-10-CM | POA: Diagnosis not present

## 2022-05-07 DIAGNOSIS — Z9181 History of falling: Secondary | ICD-10-CM | POA: Diagnosis not present

## 2022-05-07 DIAGNOSIS — R1312 Dysphagia, oropharyngeal phase: Secondary | ICD-10-CM | POA: Diagnosis not present

## 2022-05-07 DIAGNOSIS — I5043 Acute on chronic combined systolic (congestive) and diastolic (congestive) heart failure: Secondary | ICD-10-CM | POA: Diagnosis not present

## 2022-05-08 DIAGNOSIS — I5043 Acute on chronic combined systolic (congestive) and diastolic (congestive) heart failure: Secondary | ICD-10-CM | POA: Diagnosis not present

## 2022-05-08 DIAGNOSIS — R1311 Dysphagia, oral phase: Secondary | ICD-10-CM | POA: Diagnosis not present

## 2022-05-08 DIAGNOSIS — Z9181 History of falling: Secondary | ICD-10-CM | POA: Diagnosis not present

## 2022-05-08 DIAGNOSIS — E119 Type 2 diabetes mellitus without complications: Secondary | ICD-10-CM | POA: Diagnosis not present

## 2022-05-08 DIAGNOSIS — M6281 Muscle weakness (generalized): Secondary | ICD-10-CM | POA: Diagnosis not present

## 2022-05-08 DIAGNOSIS — H2513 Age-related nuclear cataract, bilateral: Secondary | ICD-10-CM | POA: Diagnosis not present

## 2022-05-08 DIAGNOSIS — R1312 Dysphagia, oropharyngeal phase: Secondary | ICD-10-CM | POA: Diagnosis not present

## 2022-05-08 DIAGNOSIS — H40023 Open angle with borderline findings, high risk, bilateral: Secondary | ICD-10-CM | POA: Diagnosis not present

## 2022-05-08 DIAGNOSIS — R2689 Other abnormalities of gait and mobility: Secondary | ICD-10-CM | POA: Diagnosis not present

## 2022-05-08 DIAGNOSIS — J9601 Acute respiratory failure with hypoxia: Secondary | ICD-10-CM | POA: Diagnosis not present

## 2022-05-09 DIAGNOSIS — J9601 Acute respiratory failure with hypoxia: Secondary | ICD-10-CM | POA: Diagnosis not present

## 2022-05-09 DIAGNOSIS — R1311 Dysphagia, oral phase: Secondary | ICD-10-CM | POA: Diagnosis not present

## 2022-05-09 DIAGNOSIS — M6281 Muscle weakness (generalized): Secondary | ICD-10-CM | POA: Diagnosis not present

## 2022-05-09 DIAGNOSIS — R2689 Other abnormalities of gait and mobility: Secondary | ICD-10-CM | POA: Diagnosis not present

## 2022-05-09 DIAGNOSIS — Z9181 History of falling: Secondary | ICD-10-CM | POA: Diagnosis not present

## 2022-05-09 DIAGNOSIS — I5043 Acute on chronic combined systolic (congestive) and diastolic (congestive) heart failure: Secondary | ICD-10-CM | POA: Diagnosis not present

## 2022-05-09 DIAGNOSIS — R1312 Dysphagia, oropharyngeal phase: Secondary | ICD-10-CM | POA: Diagnosis not present

## 2022-05-12 DIAGNOSIS — I119 Hypertensive heart disease without heart failure: Secondary | ICD-10-CM | POA: Diagnosis not present

## 2022-05-12 DIAGNOSIS — R2689 Other abnormalities of gait and mobility: Secondary | ICD-10-CM | POA: Diagnosis not present

## 2022-05-12 DIAGNOSIS — I4891 Unspecified atrial fibrillation: Secondary | ICD-10-CM | POA: Diagnosis not present

## 2022-05-12 DIAGNOSIS — M6250 Muscle wasting and atrophy, not elsewhere classified, unspecified site: Secondary | ICD-10-CM | POA: Diagnosis not present

## 2022-05-12 DIAGNOSIS — R52 Pain, unspecified: Secondary | ICD-10-CM | POA: Diagnosis not present

## 2022-05-12 DIAGNOSIS — R1311 Dysphagia, oral phase: Secondary | ICD-10-CM | POA: Diagnosis not present

## 2022-05-12 DIAGNOSIS — Z9181 History of falling: Secondary | ICD-10-CM | POA: Diagnosis not present

## 2022-05-12 DIAGNOSIS — M6281 Muscle weakness (generalized): Secondary | ICD-10-CM | POA: Diagnosis not present

## 2022-05-12 DIAGNOSIS — R1312 Dysphagia, oropharyngeal phase: Secondary | ICD-10-CM | POA: Diagnosis not present

## 2022-05-12 DIAGNOSIS — R5381 Other malaise: Secondary | ICD-10-CM | POA: Diagnosis not present

## 2022-05-12 DIAGNOSIS — J9601 Acute respiratory failure with hypoxia: Secondary | ICD-10-CM | POA: Diagnosis not present

## 2022-05-12 DIAGNOSIS — I5043 Acute on chronic combined systolic (congestive) and diastolic (congestive) heart failure: Secondary | ICD-10-CM | POA: Diagnosis not present

## 2022-05-13 DIAGNOSIS — Z9181 History of falling: Secondary | ICD-10-CM | POA: Diagnosis not present

## 2022-05-13 DIAGNOSIS — I5043 Acute on chronic combined systolic (congestive) and diastolic (congestive) heart failure: Secondary | ICD-10-CM | POA: Diagnosis not present

## 2022-05-13 DIAGNOSIS — R2689 Other abnormalities of gait and mobility: Secondary | ICD-10-CM | POA: Diagnosis not present

## 2022-05-13 DIAGNOSIS — R1312 Dysphagia, oropharyngeal phase: Secondary | ICD-10-CM | POA: Diagnosis not present

## 2022-05-13 DIAGNOSIS — J9601 Acute respiratory failure with hypoxia: Secondary | ICD-10-CM | POA: Diagnosis not present

## 2022-05-13 DIAGNOSIS — M6281 Muscle weakness (generalized): Secondary | ICD-10-CM | POA: Diagnosis not present

## 2022-05-13 DIAGNOSIS — R1311 Dysphagia, oral phase: Secondary | ICD-10-CM | POA: Diagnosis not present

## 2022-05-14 ENCOUNTER — Emergency Department (HOSPITAL_COMMUNITY): Payer: Medicare HMO

## 2022-05-14 ENCOUNTER — Inpatient Hospital Stay (HOSPITAL_COMMUNITY)
Admission: EM | Admit: 2022-05-14 | Discharge: 2022-05-16 | DRG: 920 | Disposition: A | Discharge status: Skilled Nursing Facility | Payer: Medicare HMO | Attending: Internal Medicine | Admitting: Internal Medicine

## 2022-05-14 ENCOUNTER — Other Ambulatory Visit: Payer: Self-pay

## 2022-05-14 ENCOUNTER — Encounter (HOSPITAL_COMMUNITY): Payer: Self-pay | Admitting: Emergency Medicine

## 2022-05-14 ENCOUNTER — Inpatient Hospital Stay (HOSPITAL_COMMUNITY): Payer: Medicare HMO

## 2022-05-14 DIAGNOSIS — Z8249 Family history of ischemic heart disease and other diseases of the circulatory system: Secondary | ICD-10-CM

## 2022-05-14 DIAGNOSIS — K08409 Partial loss of teeth, unspecified cause, unspecified class: Secondary | ICD-10-CM | POA: Diagnosis not present

## 2022-05-14 DIAGNOSIS — Z7901 Long term (current) use of anticoagulants: Secondary | ICD-10-CM

## 2022-05-14 DIAGNOSIS — Z79890 Hormone replacement therapy (postmenopausal): Secondary | ICD-10-CM

## 2022-05-14 DIAGNOSIS — R2689 Other abnormalities of gait and mobility: Secondary | ICD-10-CM | POA: Diagnosis not present

## 2022-05-14 DIAGNOSIS — Z7982 Long term (current) use of aspirin: Secondary | ICD-10-CM | POA: Diagnosis not present

## 2022-05-14 DIAGNOSIS — Z7984 Long term (current) use of oral hypoglycemic drugs: Secondary | ICD-10-CM

## 2022-05-14 DIAGNOSIS — J9601 Acute respiratory failure with hypoxia: Secondary | ICD-10-CM | POA: Diagnosis not present

## 2022-05-14 DIAGNOSIS — E162 Hypoglycemia, unspecified: Secondary | ICD-10-CM | POA: Diagnosis present

## 2022-05-14 DIAGNOSIS — D62 Acute posthemorrhagic anemia: Secondary | ICD-10-CM | POA: Diagnosis not present

## 2022-05-14 DIAGNOSIS — R0902 Hypoxemia: Secondary | ICD-10-CM | POA: Diagnosis not present

## 2022-05-14 DIAGNOSIS — I5042 Chronic combined systolic (congestive) and diastolic (congestive) heart failure: Secondary | ICD-10-CM | POA: Diagnosis not present

## 2022-05-14 DIAGNOSIS — I252 Old myocardial infarction: Secondary | ICD-10-CM

## 2022-05-14 DIAGNOSIS — Z955 Presence of coronary angioplasty implant and graft: Secondary | ICD-10-CM

## 2022-05-14 DIAGNOSIS — Z9181 History of falling: Secondary | ICD-10-CM | POA: Diagnosis not present

## 2022-05-14 DIAGNOSIS — D649 Anemia, unspecified: Secondary | ICD-10-CM | POA: Diagnosis not present

## 2022-05-14 DIAGNOSIS — I5043 Acute on chronic combined systolic (congestive) and diastolic (congestive) heart failure: Secondary | ICD-10-CM | POA: Diagnosis not present

## 2022-05-14 DIAGNOSIS — K068 Other specified disorders of gingiva and edentulous alveolar ridge: Secondary | ICD-10-CM | POA: Diagnosis not present

## 2022-05-14 DIAGNOSIS — Z79899 Other long term (current) drug therapy: Secondary | ICD-10-CM | POA: Diagnosis not present

## 2022-05-14 DIAGNOSIS — R1311 Dysphagia, oral phase: Secondary | ICD-10-CM | POA: Diagnosis not present

## 2022-05-14 DIAGNOSIS — Z833 Family history of diabetes mellitus: Secondary | ICD-10-CM | POA: Diagnosis not present

## 2022-05-14 DIAGNOSIS — D631 Anemia in chronic kidney disease: Secondary | ICD-10-CM | POA: Diagnosis not present

## 2022-05-14 DIAGNOSIS — I2510 Atherosclerotic heart disease of native coronary artery without angina pectoris: Secondary | ICD-10-CM | POA: Diagnosis present

## 2022-05-14 DIAGNOSIS — R55 Syncope and collapse: Secondary | ICD-10-CM | POA: Diagnosis not present

## 2022-05-14 DIAGNOSIS — R58 Hemorrhage, not elsewhere classified: Secondary | ICD-10-CM

## 2022-05-14 DIAGNOSIS — K91840 Postprocedural hemorrhage and hematoma of a digestive system organ or structure following a digestive system procedure: Secondary | ICD-10-CM | POA: Diagnosis not present

## 2022-05-14 DIAGNOSIS — I480 Paroxysmal atrial fibrillation: Secondary | ICD-10-CM | POA: Diagnosis present

## 2022-05-14 DIAGNOSIS — K1379 Other lesions of oral mucosa: Secondary | ICD-10-CM | POA: Diagnosis not present

## 2022-05-14 DIAGNOSIS — F17210 Nicotine dependence, cigarettes, uncomplicated: Secondary | ICD-10-CM | POA: Diagnosis present

## 2022-05-14 DIAGNOSIS — G4489 Other headache syndrome: Secondary | ICD-10-CM | POA: Diagnosis not present

## 2022-05-14 DIAGNOSIS — I130 Hypertensive heart and chronic kidney disease with heart failure and stage 1 through stage 4 chronic kidney disease, or unspecified chronic kidney disease: Secondary | ICD-10-CM | POA: Diagnosis present

## 2022-05-14 DIAGNOSIS — N2581 Secondary hyperparathyroidism of renal origin: Secondary | ICD-10-CM | POA: Diagnosis not present

## 2022-05-14 DIAGNOSIS — R1312 Dysphagia, oropharyngeal phase: Secondary | ICD-10-CM | POA: Diagnosis not present

## 2022-05-14 DIAGNOSIS — Z809 Family history of malignant neoplasm, unspecified: Secondary | ICD-10-CM

## 2022-05-14 DIAGNOSIS — Y836 Removal of other organ (partial) (total) as the cause of abnormal reaction of the patient, or of later complication, without mention of misadventure at the time of the procedure: Secondary | ICD-10-CM | POA: Diagnosis present

## 2022-05-14 DIAGNOSIS — I959 Hypotension, unspecified: Secondary | ICD-10-CM | POA: Diagnosis not present

## 2022-05-14 DIAGNOSIS — E039 Hypothyroidism, unspecified: Secondary | ICD-10-CM | POA: Diagnosis present

## 2022-05-14 DIAGNOSIS — E1151 Type 2 diabetes mellitus with diabetic peripheral angiopathy without gangrene: Secondary | ICD-10-CM | POA: Diagnosis present

## 2022-05-14 DIAGNOSIS — E1122 Type 2 diabetes mellitus with diabetic chronic kidney disease: Secondary | ICD-10-CM | POA: Diagnosis not present

## 2022-05-14 DIAGNOSIS — E11649 Type 2 diabetes mellitus with hypoglycemia without coma: Secondary | ICD-10-CM | POA: Diagnosis present

## 2022-05-14 DIAGNOSIS — E785 Hyperlipidemia, unspecified: Secondary | ICD-10-CM | POA: Diagnosis present

## 2022-05-14 DIAGNOSIS — R109 Unspecified abdominal pain: Secondary | ICD-10-CM | POA: Diagnosis not present

## 2022-05-14 DIAGNOSIS — N184 Chronic kidney disease, stage 4 (severe): Secondary | ICD-10-CM | POA: Diagnosis present

## 2022-05-14 DIAGNOSIS — I4892 Unspecified atrial flutter: Secondary | ICD-10-CM | POA: Diagnosis not present

## 2022-05-14 DIAGNOSIS — M6281 Muscle weakness (generalized): Secondary | ICD-10-CM | POA: Diagnosis not present

## 2022-05-14 DIAGNOSIS — R4182 Altered mental status, unspecified: Secondary | ICD-10-CM | POA: Diagnosis not present

## 2022-05-14 DIAGNOSIS — Z7401 Bed confinement status: Secondary | ICD-10-CM | POA: Diagnosis not present

## 2022-05-14 DIAGNOSIS — R42 Dizziness and giddiness: Secondary | ICD-10-CM | POA: Diagnosis not present

## 2022-05-14 LAB — CBC WITH DIFFERENTIAL/PLATELET
Abs Immature Granulocytes: 0.07 10*3/uL (ref 0.00–0.07)
Basophils Absolute: 0 10*3/uL (ref 0.0–0.1)
Basophils Relative: 0 %
Eosinophils Absolute: 0.1 10*3/uL (ref 0.0–0.5)
Eosinophils Relative: 1 %
HCT: 21.9 % — ABNORMAL LOW (ref 39.0–52.0)
Hemoglobin: 6.9 g/dL — CL (ref 13.0–17.0)
Immature Granulocytes: 1 %
Lymphocytes Relative: 8 %
Lymphs Abs: 0.8 10*3/uL (ref 0.7–4.0)
MCH: 31.7 pg (ref 26.0–34.0)
MCHC: 31.5 g/dL (ref 30.0–36.0)
MCV: 100.5 fL — ABNORMAL HIGH (ref 80.0–100.0)
Monocytes Absolute: 0.6 10*3/uL (ref 0.1–1.0)
Monocytes Relative: 6 %
Neutro Abs: 8.5 10*3/uL — ABNORMAL HIGH (ref 1.7–7.7)
Neutrophils Relative %: 84 %
Platelets: 283 10*3/uL (ref 150–400)
RBC: 2.18 MIL/uL — ABNORMAL LOW (ref 4.22–5.81)
RDW: 15.6 % — ABNORMAL HIGH (ref 11.5–15.5)
WBC: 10 10*3/uL (ref 4.0–10.5)
nRBC: 0.3 % — ABNORMAL HIGH (ref 0.0–0.2)

## 2022-05-14 LAB — CREATININE, URINE, RANDOM: Creatinine, Urine: 41 mg/dL

## 2022-05-14 LAB — CBG MONITORING, ED
Glucose-Capillary: 107 mg/dL — ABNORMAL HIGH (ref 70–99)
Glucose-Capillary: 129 mg/dL — ABNORMAL HIGH (ref 70–99)
Glucose-Capillary: 169 mg/dL — ABNORMAL HIGH (ref 70–99)
Glucose-Capillary: 29 mg/dL — CL (ref 70–99)
Glucose-Capillary: 32 mg/dL — CL (ref 70–99)
Glucose-Capillary: 62 mg/dL — ABNORMAL LOW (ref 70–99)
Glucose-Capillary: 66 mg/dL — ABNORMAL LOW (ref 70–99)

## 2022-05-14 LAB — COMPREHENSIVE METABOLIC PANEL
ALT: 21 U/L (ref 0–44)
ALT: 17 U/L (ref 0–44)
AST: 39 U/L (ref 15–41)
AST: 34 U/L (ref 15–41)
Albumin: 3.8 g/dL (ref 3.5–5.0)
Albumin: 3.6 g/dL (ref 3.5–5.0)
Alkaline Phosphatase: 61 U/L (ref 38–126)
Alkaline Phosphatase: 57 U/L (ref 38–126)
Anion gap: 13 (ref 5–15)
Anion gap: 16 — ABNORMAL HIGH (ref 5–15)
BUN: 80 mg/dL — ABNORMAL HIGH (ref 8–23)
BUN: 83 mg/dL — ABNORMAL HIGH (ref 8–23)
CO2: 23 mmol/L (ref 22–32)
CO2: 20 mmol/L — ABNORMAL LOW (ref 22–32)
Calcium: 8.6 mg/dL — ABNORMAL LOW (ref 8.9–10.3)
Calcium: 8.6 mg/dL — ABNORMAL LOW (ref 8.9–10.3)
Chloride: 100 mmol/L (ref 98–111)
Chloride: 101 mmol/L (ref 98–111)
Creatinine, Ser: 2.67 mg/dL — ABNORMAL HIGH (ref 0.61–1.24)
Creatinine, Ser: 2.74 mg/dL — ABNORMAL HIGH (ref 0.61–1.24)
GFR, Estimated: 22 mL/min — ABNORMAL LOW (ref >60)
GFR, Estimated: 21 mL/min — ABNORMAL LOW (ref >60)
Glucose, Bld: 116 mg/dL — ABNORMAL HIGH (ref 70–99)
Glucose, Bld: 180 mg/dL — ABNORMAL HIGH (ref 70–99)
Potassium: 4.8 mmol/L (ref 3.5–5.1)
Potassium: 4.6 mmol/L (ref 3.5–5.1)
Sodium: 136 mmol/L (ref 135–145)
Sodium: 137 mmol/L (ref 135–145)
Total Bilirubin: 0.7 mg/dL (ref 0.3–1.2)
Total Bilirubin: 0.5 mg/dL (ref 0.3–1.2)
Total Protein: 7.6 g/dL (ref 6.5–8.1)
Total Protein: 6.9 g/dL (ref 6.5–8.1)

## 2022-05-14 LAB — HEMOGLOBIN A1C
Hgb A1c MFr Bld: 5.6 % (ref 4.8–5.6)
Mean Plasma Glucose: 114.02 mg/dL

## 2022-05-14 LAB — CBC
HCT: 22.8 % — ABNORMAL LOW (ref 39.0–52.0)
Hemoglobin: 6.7 g/dL — CL (ref 13.0–17.0)
MCH: 31.3 pg (ref 26.0–34.0)
MCHC: 29.4 g/dL — ABNORMAL LOW (ref 30.0–36.0)
MCV: 106.5 fL — ABNORMAL HIGH (ref 80.0–100.0)
Platelets: 302 10*3/uL (ref 150–400)
RBC: 2.14 MIL/uL — ABNORMAL LOW (ref 4.22–5.81)
RDW: 15.5 % (ref 11.5–15.5)
WBC: 9.5 10*3/uL (ref 4.0–10.5)
nRBC: 0.2 % (ref 0.0–0.2)

## 2022-05-14 LAB — URINALYSIS, ROUTINE W REFLEX MICROSCOPIC
Bilirubin Urine: NEGATIVE
Glucose, UA: 50 mg/dL — AB
Hgb urine dipstick: NEGATIVE
Ketones, ur: NEGATIVE mg/dL
Leukocytes,Ua: NEGATIVE
Nitrite: NEGATIVE
Protein, ur: NEGATIVE mg/dL
Specific Gravity, Urine: 1.009 (ref 1.005–1.030)
pH: 5 (ref 5.0–8.0)

## 2022-05-14 LAB — PREPARE RBC (CROSSMATCH)

## 2022-05-14 LAB — SODIUM, URINE, RANDOM: Sodium, Ur: 78 mmol/L

## 2022-05-14 LAB — LACTIC ACID, PLASMA: Lactic Acid, Venous: 3.1 mmol/L (ref 0.5–1.9)

## 2022-05-14 LAB — PROTIME-INR
INR: 1.3 — ABNORMAL HIGH (ref 0.8–1.2)
Prothrombin Time: 16.3 seconds — ABNORMAL HIGH (ref 11.4–15.2)

## 2022-05-14 MED ORDER — TRANEXAMIC ACID FOR EPISTAXIS
500.0000 mg | Freq: Once | TOPICAL | Status: DC
  Filled 2022-05-14: qty 10

## 2022-05-14 MED ORDER — DEXTROSE 50 % IV SOLN
1.0000 | Freq: Once | INTRAVENOUS | Status: AC
  Administered 2022-05-14: 50 mL via INTRAVENOUS
  Filled 2022-05-14: qty 50

## 2022-05-14 MED ORDER — TRANEXAMIC ACID FOR INHALATION
500.0000 mg | Freq: Once | RESPIRATORY_TRACT | Status: AC
  Administered 2022-05-14: 500 mg via RESPIRATORY_TRACT
  Filled 2022-05-14: qty 10

## 2022-05-14 MED ORDER — ATORVASTATIN CALCIUM 40 MG PO TABS: 80.0000 mg | ORAL_TABLET | Freq: Every day | ORAL | Status: DC

## 2022-05-14 MED ORDER — EZETIMIBE 10 MG PO TABS: 10.0000 mg | ORAL_TABLET | Freq: Every day | ORAL | Status: DC

## 2022-05-14 MED ORDER — IPRATROPIUM-ALBUTEROL 0.5-2.5 (3) MG/3ML IN SOLN
3.0000 mL | Freq: Three times a day (TID) | RESPIRATORY_TRACT | Status: DC
  Administered 2022-05-14 – 2022-05-15 (×3): 3 mL via RESPIRATORY_TRACT
  Filled 2022-05-14 (×3): qty 3

## 2022-05-14 MED ORDER — DEXTROSE IN LACTATED RINGERS 5 % IV SOLN: INTRAVENOUS | Status: DC

## 2022-05-14 MED ORDER — LEVOTHYROXINE SODIUM 25 MCG PO TABS
25.0000 ug | ORAL_TABLET | Freq: Every day | ORAL | Status: DC
  Administered 2022-05-15 – 2022-05-16 (×2): 25 ug via ORAL
  Filled 2022-05-14 (×2): qty 1

## 2022-05-14 MED ORDER — SODIUM CHLORIDE 0.9 % IV SOLN: INTRAVENOUS | Status: DC

## 2022-05-14 MED ORDER — SODIUM CHLORIDE 0.9% IV SOLUTION: Freq: Once | INTRAVENOUS | Status: AC

## 2022-05-14 MED ORDER — DEXTROSE 50 % IV SOLN
25.0000 g | INTRAVENOUS | Status: AC
  Filled 2022-05-14: qty 50

## 2022-05-14 NOTE — ED Notes (Signed)
Lab called a critical Hemoglobin 6.7 and Dr Kristopher Oppenheim was made aware.

## 2022-05-14 NOTE — ED Notes (Signed)
Patient transported to CT 

## 2022-05-14 NOTE — ED Provider Notes (Signed)
Kevin Schwartz Provider Note   CSN: [id number] Arrival date & time: 05/14/22  1312     History  Chief Complaint  Patient presents with   Loss of Consciousness    Kevin Schwartz is a 89 y.o. male.  HPI Patient presents after an episode of syncope.  EMS from nursing facility.  He currently denies complaints.  EMS reports the patient was witnessed to have 3 minutes of staring off into space, during which she was assisted into a chair, no fall trauma.  When prompted the patient does complain of possible headache.  History is notable for Eliquis use for A-fib, and tooth extraction 1 week ago, with seeping that is persistent in his teeth.  He has been taking his medication regularly, including Eliquis.    Home Medications Prior to Admission medications   Medication Sig Start Date End Date Taking? Authorizing Provider  ACCU-CHEK SOFTCLIX LANCETS lancets Use to test blood sugar daily. Dx:E11.8 03/25/17  Yes Reed, Tiffany L, DO  acetaminophen (TYLENOL) 500 MG tablet Take 1,000 mg by mouth every 8 (eight) hours as needed for moderate pain.   Yes [provider]  Alcohol Swabs (B-D SINGLE USE SWABS REGULAR) PADS Use with testing of blood sugar. Dx:E11.8 03/25/17  Yes Reed, Tiffany L, DO  allopurinol (ZYLOPRIM) 100 MG tablet Take 1 tablet (100 mg total) by mouth daily. 01/31/21  Yes [name], MD  apixaban (ELIQUIS) 2.5 MG TABS tablet Take 1 tablet (2.5 mg total) by mouth 2 (two) times daily. 04/28/21  Yes Elgergawy, Silver Huguenin, MD  aspirin EC 81 MG tablet Take 81 mg by mouth daily. Swallow whole.   Yes [provider]  atorvastatin (LIPITOR) 80 MG tablet Take 1 tablet (80 mg total) by mouth daily. 01/31/21 08/01/22 Yes Ngetich, Dinah C, NP  diphenhydrAMINE (BENADRYL) 25 MG tablet Take 25 mg by mouth 2 (two) times daily as needed for itching.   Yes [provider]  Emollient (EUCERIN) lotion Apply 1 application.  topically daily. Apply to rash on  chest, abdomen, back,neck and shoulders   Yes [provider]  empagliflozin (JARDIANCE) 10 MG TABS tablet Take 1 tablet (10 mg total) by mouth daily. Patient takes 1 tablet by mouth every other day. 01/14/22  Yes Milford, Maricela Bo, FNP  ezetimibe (ZETIA) 10 MG tablet Take 10 mg by mouth daily.   Yes [provider]  glucose blood (ACCU-CHEK AVIVA PLUS) test strip Use to check blood sugar daily. Dx:E11.8 09/05/19  Yes Reed, Tiffany L, DO  guaiFENesin (MUCINEX) 600 MG 12 hr tablet Take 600 mg by mouth 2 (two) times daily.   Yes [provider]  ipratropium-albuterol (DUONEB) 0.5-2.5 (3) MG/3ML SOLN Inhale 3 mLs into the lungs 3 (three) times daily. 06/17/21  Yes [provider]  levothyroxine (SYNTHROID) 50 MCG tablet Patient takes 1/2 tablet by mouth before breakfast with water only.   Yes [provider]  lidocaine (LIDODERM) 5 % Place 1 patch onto the skin daily. Remove & Discard patch within 12 hours or as directed by MD Patient taking differently: Place 1 patch onto the skin See admin instructions. Apply to left knee in the morning,remove at bedtime 04/01/20  Yes Bero, Barth Kirks, MD  nitroGLYCERIN (NITROSTAT) 0.4 MG SL tablet DISSOLVE 1 TABLET UNDER THE TONGUE EVERY 5 MINUTES AS  NEEDED FOR CHEST PAIN. MAX  OF 3 TABLETS IN 15 MINUTES. CALL 911 IF PAIN PERSISTS. 11/08/20  Yes Alain Honey  M, MD  tamsulosin (FLOMAX) 0.4 MG CAPS capsule Take 0.4 mg by mouth daily. 06/24/21  Yes [provider]  torsemide (DEMADEX) 20 MG tablet Take 3 tablets (60 mg total) by mouth daily. 07/31/21  Yes Milford, Maricela Bo, FNP  white petrolatum (VASELINE) GEL Apply 1 application. topically 3 (three) times daily as needed (inside bilateral nares).   Yes [provider]  Zinc Oxide 25 % PSTE Apply 1 application. topically in the morning and at bedtime. Apply to bottom during incontinence care   Yes [provider]       Allergies    Lisinopril, Losartan potassium, Crestor [rosuvastatin], and Tape    Review of Systems   Review of Systems  All other systems reviewed and are negative.   Physical Exam Updated Vital Signs BP (!) 141/49   Pulse 61   Temp 98.1 F (36.7 C) (Oral)   Resp 13   Ht '5\' 8"'$  (1.727 m)   Wt 83.5 kg   SpO2 (!) 73%   BMI 27.98 kg/m  Physical Exam Vitals and nursing note reviewed.  Constitutional:      General: He is not in acute distress.    Appearance: He is well-developed.  HENT:     Head: Normocephalic and atraumatic.   Eyes:     Conjunctiva/sclera: Conjunctivae normal.  Cardiovascular:     Rate and Rhythm: Normal rate and regular rhythm.  Pulmonary:     Effort: Pulmonary effort is normal. No respiratory distress.     Breath sounds: No stridor.  Abdominal:     General: There is no distension.  Skin:    General: Skin is warm and dry.  Neurological:     Mental Status: He is alert and oriented to person, place, and time.     ED Results / Procedures / Treatments   Labs (all labs ordered are listed, but only abnormal results are displayed) Labs Reviewed  COMPREHENSIVE METABOLIC PANEL - Abnormal; Notable for the following components:      Result Value   Glucose, Bld 116 (*)    BUN 80 (*)    Creatinine, Ser 2.67 (*)    Calcium 8.6 (*)    GFR, Estimated 22 (*)    All other components within normal limits  CBC WITH DIFFERENTIAL/PLATELET - Abnormal; Notable for the following components:   RBC 2.18 (*)    Hemoglobin 6.9 (*)    HCT 21.9 (*)    MCV 100.5 (*)    RDW 15.6 (*)    nRBC 0.3 (*)    Neutro Abs 8.5 (*)    All other components within normal limits  PROTIME-INR - Abnormal; Notable for the following components:   Prothrombin Time 16.3 (*)    INR 1.3 (*)    All other components within normal limits  CBG MONITORING, ED - Abnormal; Notable for the following components:   Glucose-Capillary 29 (*)    All other components within normal limits  CBG  MONITORING, ED - Abnormal; Notable for the following components:   Glucose-Capillary 32 (*)    All other components within normal limits  CBG MONITORING, ED - Abnormal; Notable for the following components:   Glucose-Capillary 169 (*)    All other components within normal limits  CBC  TYPE AND SCREEN  PREPARE RBC (CROSSMATCH)    EKG EKG Interpretation  Date/Time:  Wednesday May 14 2022 13:27:49 EST Ventricular Rate:  90 PR Interval:  183 QRS Duration: 107 QT Interval:  430 QTC Calculation: 527  R Axis:   14 Text Interpretation: Sinus or ectopic atrial rhythm Borderline T wave abnormalities Prolonged QT interval Abnormal ECG Confirmed by Carmin Muskrat [phone]) on 05/14/2022 2:49:26 PM  Radiology CT HEAD WO CONTRAST  Result Date: 05/14/2022 CLINICAL DATA:  Altered mental status, syncopal episode EXAM: CT HEAD WITHOUT CONTRAST TECHNIQUE: Contiguous axial images were obtained from the base of the skull through the vertex without intravenous contrast. RADIATION DOSE REDUCTION: This exam was performed according to the departmental dose-optimization program which includes automated exposure control, adjustment of the mA and/or kV according to patient size and/or use of iterative reconstruction technique. COMPARISON:  07/22/2021 FINDINGS: Brain: No evidence of acute infarction, hemorrhage, hydrocephalus, extra-axial collection or mass lesion/mass effect. Periventricular and deep white matter hypodensity. Vascular: No hyperdense vessel or unexpected calcification. Skull: Normal. Negative for fracture or focal lesion. Sinuses/Orbits: No acute finding. Other: None. IMPRESSION: No acute intracranial pathology. Small-vessel white matter disease in keeping with advanced patient age. Electronically Signed   By: Delanna Ahmadi M.D.   On: 05/14/2022 14:27   DG Chest Port 1 View  Result Date: 05/14/2022 CLINICAL DATA:  Loss of consciousness. EXAM: PORTABLE CHEST 1 VIEW COMPARISON:  07/22/2021 FINDINGS:  Stable cardiac enlargement. Mild scarring/atelectasis at both lung bases. There is no evidence of pulmonary edema, consolidation, pneumothorax or pleural fluid. Visualized bony structures are unremarkable. IMPRESSION: Stable cardiac enlargement. Mild scarring/atelectasis at both lung bases. Electronically Signed   By: Aletta Edouard M.D.   On: 05/14/2022 13:45    Procedures Procedures    Medications Ordered in ED Medications  0.9 %  sodium chloride infusion ( Intravenous New Bag/Given 05/14/22 1457)  atorvastatin (LIPITOR) tablet 80 mg (has no administration in time range)  ezetimibe (ZETIA) tablet 10 mg (has no administration in time range)  levothyroxine (SYNTHROID) tablet 25 mcg (has no administration in time range)  ipratropium-albuterol (DUONEB) 0.5-2.5 (3) MG/3ML nebulizer solution 3 mL (has no administration in time range)  dextrose 50 % solution 25 g (has no administration in time range)  dextrose 50 % solution 50 mL (50 mLs Intravenous Given 05/14/22 1457)  0.9 %  sodium chloride infusion (Manually program via Guardrails IV Fluids) ( Intravenous New Bag/Given 05/14/22 1732)  dextrose 50 % solution 50 mL (50 mLs Intravenous Given 05/14/22 1531)  tranexamic acid (CYKLOKAPRON) 1000 MG/10ML nebulizer solution 500 mg (500 mg Nebulization Given 05/14/22 1556)    ED Course/ Medical Decision Making/ A&P                             Medical Decision Making Patient with multiple medical issues including need for chronic anticoagulation.,  Hypertension, recent dental procedure presents after an episode of syncope.  Differential including arrhythmia, hypovolemia, infection, intracranial abnormality, considered.  Patient put on monitors, labs sent x-ray sent CT sent.  Cardiac 90 sinus normal Pulse ox 100% room air normal   Amount and/or Complexity of Data Reviewed Independent Historian: EMS External Data Reviewed: notes. Labs: ordered. Decision-making details documented in ED  Course. Radiology: ordered and independent interpretation performed. Decision-making details documented in ED Course. ECG/medicine tests: ordered and independent interpretation performed. Decision-making details documented in ED Course.  Risk Prescription drug management. Decision regarding hospitalization.  5:40 PM Patient with glucose 29.  Dextrose, juice provided.  Update:  Glucose only marginally improved.  Additional D50 given   5:40 PM Patient calm, in no distress, receiving TXA via nebulizer due to persistent oral bleeding, evidence for anemia  on labs.  Patient is oriented appropriately, is tolerating therapy without complication, is hemodynamically reassuring, and glucose is now improved to greater than 160.  Update: Patient in no distress.  Adult male with multiple medical problems including ongoing anticoagulation use presents after episode of syncope.  Patient had recent dental procedure and there is some suspicion for hypovolemia versus anemia contributing to his episode.  Demonstration of anemia addressed with provision of red blood cells, suspicion for ongoing dental bleeding contributing to this.  Patient received nebulized TXA with decreased oral bleeding as well. Patient also found to be hypoglycemic without obvious etiology, requiring 2 doses of dextrose 50. Given all of these abnormalities he was admitted to the stepdown unit for further monitoring, management.  CRITICAL CARE Performed by: Carmin Muskrat Total critical care time: 35 minutes Critical care time was exclusive of separately billable procedures and treating other patients. Critical care was necessary to treat or prevent imminent or life-threatening deterioration. Critical care was time spent personally by me on the following activities: development of treatment plan with patient and/or surrogate as well as nursing, discussions with consultants, evaluation of patient's response to treatment, examination of  patient, obtaining history from patient or surrogate, ordering and performing treatments and interventions, ordering and review of laboratory studies, ordering and review of radiographic studies, pulse oximetry and re-evaluation of patient's condition.         Final Clinical Impression(s) / ED Diagnoses Final diagnoses:  Syncope and collapse  Hypoglycemia  Bleeding   CRITICAL CARE Performed by: Carmin Muskrat Total critical care time: 35 minutes Critical care time was exclusive of separately billable procedures and treating other patients. Critical care was necessary to treat or prevent imminent or life-threatening deterioration. Critical care was time spent personally by me on the following activities: development of treatment plan with patient and/or surrogate as well as nursing, discussions with consultants, evaluation of patient's response to treatment, examination of patient, obtaining history from patient or surrogate, ordering and performing treatments and interventions, ordering and review of laboratory studies, ordering and review of radiographic studies, pulse oximetry and re-evaluation of patient's condition.    Carmin Muskrat, MD 05/14/22 [phone]

## 2022-05-14 NOTE — ED Notes (Signed)
Failed attempt to collect labs

## 2022-05-14 NOTE — ED Triage Notes (Signed)
Pt BIB [facility] from [facility]. Son witness pt having syncope episode after he became unsteady after trying to get up out the wheelchair to stand up. Pt began steering off into space for 3 minutes and not responding. EMS reported Pt had 7 tooth extracted on Thursday  and he is on Eliquis and been having little bleeding from his mouth. Pt had headache for two hours. Pt is now A & O x 4.

## 2022-05-14 NOTE — H&P (Addendum)
History and Physical    Patient: Kevin Schwartz IWL:[id number] DOB: 11-18-1930 DOA: 05/14/2022 DOS: the patient was seen and examined on 05/14/2022 PCP: [name], MD  Patient coming from: Home  Chief Complaint:  Chief Complaint  Patient presents with   Loss of Consciousness   HPI: Kevin Schwartz is a 89 y.o. male with medical history significant of CAD with NSTEMI 2023, PAD, CKD, DM2, HTN, HLD, Afib here with syncope.   Pt reports episode of syncope today. He was sitting down and trying to get up at home he felt lightheaded and collapsed back in his chair and was out for about 3 months he reports.  He reports his family was with him at that time and he did not injure anything as a result of the syncopal episode.  He reports last Thursday he had dental extraction and since then has been bleeding from his mouth.  He reports he has not been to eat as much as well.  He stopped taking his blood thinner on Sunday.  He otherwise denies any shortness of breath, chest pain, chest pressure, lower extreme edema, orthopnea, palpitations, PND.  He reports dark stools that began after his tooth extraction.  He denies any blood in his urine.  Denies any abdominal pain.   Review of Systems: As mentioned in the history of present illness. All other systems reviewed and are negative. Past Medical History:  Diagnosis Date   Anemia due to chronic kidney disease    Bilateral inguinal hernia    CAD (coronary artery disease) cardiologist-  dr bensimhon   PTCA of OM in 1998, [place] in 2000, Cath 9/07 LM ok LAD ok. LCX 95% in OM prior to previous stent RCA. nondominant normal EF normal. [place] DES to OM 2007 (PLACED PROXIAMAL TO PREVIOUS STENT)   Chronic kidney disease, stage III (moderate) ([place])    nephrologist-  dr detarding   Depression    Diabetes mellitus type 2, diet-controlled ([place])    followed by pcp,  last A1c 5.2 on 09-10-2017 in epic   Gout, unspecified    10-29-2017  per pt  stable   HLD (hyperlipidemia)    HTN (hypertension)    MGUS (monoclonal gammopathy of unknown significance) previously followed by dr Beryle Beams , [facility] and released 08/01/2011   IgG kappa dx 2002 8% plasma cells in bone marrow; no lesions on bone X-rays;   Nocturia    OA (osteoarthritis)    OSA on CPAP    per study 01-30-2004  severe osa , AHI 51.6/hr   PAD (peripheral artery disease) ([place])    05-21-2006  left renal artery stenosis, s/p balloon angioplasty and stenting;  last duplex 02/ 2012  normal , arteries patent   S/P coronary artery stent placement    2000--  BMS x1  to OM;   2007-- DES x1  to OM proximal to previous stent   Secondary hyperparathyroidism of renal origin [facility])    Urgency of urination    Wears glasses    Past Surgical History:  Procedure Laterality Date   CARDIOVASCULAR STRESS TEST  2010   per dr bensimhon epic note dated 04-18-2016  normal   CORONARY ANGIOPLASTY  1998   PTCA to OM   CORONARY ANGIOPLASTY WITH STENT PLACEMENT  2000   PCI and BMS x1 OM   CORONARY ANGIOPLASTY WITH STENT PLACEMENT  12-18-2005  dr Claiborne Billings   DES x1 to proximal to previously placed stent in OM   CORONARY BALLOON  ANGIOPLASTY N/A 08/02/2020   Procedure: CORONARY BALLOON ANGIOPLASTY;  Surgeon: Troy Sine, MD;  Location: [facility];  Service: Cardiovascular;  Laterality: N/A;   CORONARY STENT INTERVENTION N/A 08/02/2020   Procedure: CORONARY STENT INTERVENTION;  Surgeon: Troy Sine, MD;  Location: [facility];  Service: Cardiovascular;  Laterality: N/A;   HERNIA REPAIR     INGUINAL HERNIA REPAIR Bilateral 10/30/2017   Procedure: LAPAROSCOPIC  BILATERAL INGUINAL HERNIA REPAIR  AND LEFT [place];  Surgeon: Michael Boston, MD;  Location: [facility];  Service: General;  Laterality: Bilateral;   INSERTION OF MESH Bilateral 10/30/2017   Procedure: INSERTION OF MESH;  Surgeon: Michael Boston, MD;  Location: [facility];  Service:  General;  Laterality: Bilateral;   LEFT HEART CATH AND CORONARY ANGIOGRAPHY N/A 08/01/2020   Procedure: LEFT HEART CATH AND CORONARY ANGIOGRAPHY;  Surgeon: Troy Sine, MD;  Location: [facility];  Service: Cardiovascular;  Laterality: N/A;   RENAL ANGIOPLASTY Left 05-21-2006   dr berry   and stenting   TOTAL KNEE ARTHROPLASTY Left 07-22-2005   dr Shellia Carwin  [facility]   TRANSTHORACIC ECHOCARDIOGRAM  04/18/2016   ef 03-54%, grade 1 diastolic dysfunction/  mild to moderate AR/  mild MR   Social History:  reports that he has been smoking cigarettes. He has been smoking an average of .25 packs per day. He has never used smokeless tobacco. He reports that he does not currently use alcohol. He reports that he does not use drugs.  Allergies  Allergen Reactions   Lisinopril Swelling and Other (See Comments)    Angioedema    Losartan Potassium Swelling and Other (See Comments)    Angioedema    Crestor [Rosuvastatin] Swelling and Other (See Comments)    Angioedema    Tape Other (See Comments)    Irritates the skin    Family History  Problem Relation Age of Onset   Heart disease Mother    Healthy Father    Heart disease Sister    Heart disease Sister    Cancer Sister        type unknown   Hypertension Sister    Diabetes Brother    Diabetes Brother    Diabetes Other    Coronary artery disease Other    Cancer Other     Prior to Admission medications   Medication Sig Start Date End Date Taking? Authorizing Provider  ACCU-CHEK SOFTCLIX LANCETS lancets Use to test blood sugar daily. Dx:E11.8 03/25/17  Yes Reed, Tiffany L, DO  acetaminophen (TYLENOL) 500 MG tablet Take 1,000 mg by mouth every 8 (eight) hours as needed for moderate pain.   Yes [provider]  Alcohol Swabs (B-D SINGLE USE SWABS REGULAR) PADS Use with testing of blood sugar. Dx:E11.8 03/25/17  Yes Reed, Tiffany L, DO  allopurinol (ZYLOPRIM) 100 MG tablet Take 1 tablet (100 mg total) by mouth daily. 01/31/21   Yes [name], MD  apixaban (ELIQUIS) 2.5 MG TABS tablet Take 1 tablet (2.5 mg total) by mouth 2 (two) times daily. 04/28/21  Yes Elgergawy, Silver Huguenin, MD  aspirin EC 81 MG tablet Take 81 mg by mouth daily. Swallow whole.   Yes [provider]  atorvastatin (LIPITOR) 80 MG tablet Take 1 tablet (80 mg total) by mouth daily. 01/31/21 08/01/22 Yes Ngetich, Dinah C, NP  diphenhydrAMINE (BENADRYL) 25 MG tablet Take 25 mg by mouth 2 (two) times daily as needed for itching.  Yes [provider]  Emollient (EUCERIN) lotion Apply 1 application. topically daily. Apply to rash on  chest, abdomen, back,neck and shoulders   Yes [provider]  empagliflozin (JARDIANCE) 10 MG TABS tablet Take 1 tablet (10 mg total) by mouth daily. Patient takes 1 tablet by mouth every other day. 01/14/22  Yes Milford, Maricela Bo, FNP  ezetimibe (ZETIA) 10 MG tablet Take 10 mg by mouth daily.   Yes [provider]  glucose blood (ACCU-CHEK AVIVA PLUS) test strip Use to check blood sugar daily. Dx:E11.8 09/05/19  Yes Reed, Tiffany L, DO  guaiFENesin (MUCINEX) 600 MG 12 hr tablet Take 600 mg by mouth 2 (two) times daily.   Yes [provider]  ipratropium-albuterol (DUONEB) 0.5-2.5 (3) MG/3ML SOLN Inhale 3 mLs into the lungs 3 (three) times daily. 06/17/21  Yes [provider]  levothyroxine (SYNTHROID) 50 MCG tablet Patient takes 1/2 tablet by mouth before breakfast with water only.   Yes [provider]  lidocaine (LIDODERM) 5 % Place 1 patch onto the skin daily. Remove & Discard patch within 12 hours or as directed by MD Patient taking differently: Place 1 patch onto the skin See admin instructions. Apply to left knee in the morning,remove at bedtime 04/01/20  Yes Bero, Barth Kirks, MD  nitroGLYCERIN (NITROSTAT) 0.4 MG SL tablet DISSOLVE 1 TABLET UNDER THE TONGUE EVERY 5 MINUTES AS  NEEDED FOR CHEST PAIN. MAX  OF 3 TABLETS IN 15 MINUTES. CALL 911 IF PAIN PERSISTS.  11/08/20  Yes [name], MD  tamsulosin (FLOMAX) 0.4 MG CAPS capsule Take 0.4 mg by mouth daily. 06/24/21  Yes [provider]  torsemide (DEMADEX) 20 MG tablet Take 3 tablets (60 mg total) by mouth daily. 07/31/21  Yes Milford, Maricela Bo, FNP  white petrolatum (VASELINE) GEL Apply 1 application. topically 3 (three) times daily as needed (inside bilateral nares).   Yes [provider]  Zinc Oxide 25 % PSTE Apply 1 application. topically in the morning and at bedtime. Apply to bottom during incontinence care   Yes [provider]    Physical Exam: Vitals:   05/14/22 1445 05/14/22 1500 05/14/22 1515 05/14/22 1530  BP: 138/61 (!) 137/101 126/63 (!) 141/49  Pulse:  61    Resp: 20 (!) 21 (!) 31 13  Temp:      TempSrc:      SpO2:  (!) 73%    Weight:      Height:       Physical Exam Vitals and nursing note reviewed.  Constitutional:      General: He is not in acute distress.    Appearance: He is not diaphoretic.  HENT:     Head: Atraumatic.     Mouth/Throat:     Comments: Oropharynx covered in bright blood. Unable to visualize source of bleeding.  Eyes:     Pupils: Pupils are equal, round, and reactive to light.  Cardiovascular:     Rate and Rhythm: Normal rate and regular rhythm.     Pulses: Normal pulses.     Heart sounds: No murmur heard. Pulmonary:     Effort: Pulmonary effort is normal. No respiratory distress.     Breath sounds: Normal breath sounds. No rales.  Abdominal:     General: Abdomen is flat. There is no distension.     Palpations: Abdomen is soft.     Tenderness: There is no abdominal tenderness. There is no rebound.  Musculoskeletal:     Right lower  leg: No edema.     Left lower leg: No edema.  Skin:    General: Skin is warm and dry.     Capillary Refill: Capillary refill takes less than 2 seconds.  Neurological:     Mental Status: He is alert and oriented to person, place, and time. Mental status is at baseline.  Psychiatric:         Mood and Affect: Mood normal.     Data Reviewed:     Latest Ref Rng & Units 05/14/2022    1:55 PM 03/14/2022    5:48 AM 09/12/2021   11:15 AM  CBC  WBC 4.0 - 10.5 K/uL 10.0  7.8  8.5   Hemoglobin 13.0 - 17.0 g/dL 6.9  9.8  12.1   Hematocrit 39.0 - 52.0 % 21.9  29.7  35.7   Platelets 150 - 400 K/uL 283  384  414       Latest Ref Rng & Units 05/14/2022    1:55 PM 03/14/2022    5:48 AM 01/14/2022   11:48 AM  BMP  Glucose 70 - 99 mg/dL 116  103  92   BUN 8 - 23 mg/dL 80  73  64   Creatinine 0.61 - 1.24 mg/dL 2.67  2.54  2.28   Sodium 135 - 145 mmol/L 136  136  139   Potassium 3.5 - 5.1 mmol/L 4.8  4.3  4.0   Chloride 98 - 111 mmol/L 100  102  103   CO2 22 - 32 mmol/L '23  25  26   '$ Calcium 8.9 - 10.3 mg/dL 8.6  9.2  9.1    CBG (last 3)  Recent Labs    05/14/22 1444 05/14/22 1515 05/14/22 1559  GLUCAP 29* 32* 169*     Assessment and Plan:  Kevin Schwartz is a 89 y.o. male with medical history significant of CAD with NSTEMI 2023, PAD, CKD, DM2, HTN, HLD, Afib, hypothyroidism here with syncope.   Syncope ABLA Hypoglycemia Etiology of syncope likely due to ABLA 2/2 dental extraction on Thursday last week and low BG. He reports not eating as much as a result of bleeding and dental extraction. He reports dark stools since dental extraction but no other sources of blood loss. Pt later developed abd pain, obtained ct abd/pelvis to rule out acute abdominal bleed. D/w patient risks of contrasted study and patient was agreeable but per radiology not allowed to give contrast. Last eliquis on Sunday.  -s/p nebulized TXA, if not improved will need dental consult or repeat in AM -Hb 6.9, receiving 2 units blood, transfusion goal < 7 -CBC post transfusion and then q8h -s/p D50 in ED with glucose normalizing to 160 -q4 POC BG -telemetry -CT abd/pelvis  CAD witth multiple PCI and significant residual disease NSTEMI 07/2020 Denies any chest pain currently. Last PCI 07/2020  per his report. Last [facility] 07/2020. Unfortunately with active bleeding above and significant anemia will have to hold ASA and reduced dose doac. His last intervention was 07/2020 though has significant residual disease.   -hold aspirin and eliquis -holding statin and zetia  -holding jardiance  HFmidEF ICM CM Euvolemic on exam. On SGLT2i and diuretic. Will hold both for now. Most recent TTE 04/2021 with EF 45-50%.   pAfib In sinus today.  -holding eliquis 2.5 mg BID -tele  AKI CKD 4 Cr 2.67, elevated from 2.5. Likely prerenal.  -repeat BMP in AM, urine studies -hold sglt2i  DM2 -hypoglycemia protocol, POCT BG q4h -holding  sglt2i  Hypothyroidism -continue levothyroxine   DVT ppx:  holding   Advance Care Planning:   Code Status: Full Code   Consults: None  Family Communication: none at bedside  Severity of Illness: The appropriate patient status for this patient is INPATIENT. Inpatient status is judged to be reasonable and necessary in order to provide the required intensity of service to ensure the patient's safety. The patient's presenting symptoms, physical exam findings, and initial radiographic and laboratory data in the context of their chronic comorbidities is felt to place them at high risk for further clinical deterioration. Furthermore, it is not anticipated that the patient will be medically stable for discharge from the hospital within 2 midnights of admission.   * I certify that at the point of admission it is my clinical judgment that the patient will require inpatient hospital care spanning beyond 2 midnights from the point of admission due to high intensity of service, high risk for further deterioration and high frequency of surveillance required.*  Author: Lorelei Pont, MD 05/14/2022 4:45 PM  For on call review [email or web address].

## 2022-05-14 NOTE — ED Notes (Signed)
Rn was told by Dr Ulyess Blossom to draw Cmet and CBC while patient was receiving blood that was collected at [facility]

## 2022-05-14 NOTE — ED Notes (Signed)
This Probation officer in pts room attempting to fix spo2 monitor. While in the room pt had a brief episode of convulsions lasting aprox 15 seconds with the pt staring off at the ceiling. HR low 40's on the monitor. Pt alert again within a minute of the episode and able to answer questions. Unable to recollect what happened.

## 2022-05-14 NOTE — ED Notes (Signed)
MD Bridgett Larsson notified about critical lactic and CBG.

## 2022-05-14 NOTE — ED Notes (Signed)
Lab call today for a critical Hemoglobin 6.9 and Dr Vanita Panda was made aware at 1459 pm

## 2022-05-15 DIAGNOSIS — K068 Other specified disorders of gingiva and edentulous alveolar ridge: Secondary | ICD-10-CM

## 2022-05-15 DIAGNOSIS — R55 Syncope and collapse: Secondary | ICD-10-CM | POA: Diagnosis not present

## 2022-05-15 LAB — COMPREHENSIVE METABOLIC PANEL
ALT: 16 U/L (ref 0–44)
AST: 26 U/L (ref 15–41)
Albumin: 3.2 g/dL — ABNORMAL LOW (ref 3.5–5.0)
Alkaline Phosphatase: 50 U/L (ref 38–126)
Anion gap: 11 (ref 5–15)
BUN: 81 mg/dL — ABNORMAL HIGH (ref 8–23)
CO2: 23 mmol/L (ref 22–32)
Calcium: 8.2 mg/dL — ABNORMAL LOW (ref 8.9–10.3)
Chloride: 104 mmol/L (ref 98–111)
Creatinine, Ser: 2.39 mg/dL — ABNORMAL HIGH (ref 0.61–1.24)
GFR, Estimated: 25 mL/min — ABNORMAL LOW (ref >60)
Glucose, Bld: 136 mg/dL — ABNORMAL HIGH (ref 70–99)
Potassium: 4 mmol/L (ref 3.5–5.1)
Sodium: 138 mmol/L (ref 135–145)
Total Bilirubin: 0.5 mg/dL (ref 0.3–1.2)
Total Protein: 6.4 g/dL — ABNORMAL LOW (ref 6.5–8.1)

## 2022-05-15 LAB — GLUCOSE, CAPILLARY
Glucose-Capillary: 102 mg/dL — ABNORMAL HIGH (ref 70–99)
Glucose-Capillary: 130 mg/dL — ABNORMAL HIGH (ref 70–99)
Glucose-Capillary: 152 mg/dL — ABNORMAL HIGH (ref 70–99)
Glucose-Capillary: 39 mg/dL — CL (ref 70–99)
Glucose-Capillary: 52 mg/dL — ABNORMAL LOW (ref 70–99)
Glucose-Capillary: 60 mg/dL — ABNORMAL LOW (ref 70–99)

## 2022-05-15 LAB — CBG MONITORING, ED
Glucose-Capillary: 105 mg/dL — ABNORMAL HIGH (ref 70–99)
Glucose-Capillary: 107 mg/dL — ABNORMAL HIGH (ref 70–99)
Glucose-Capillary: 52 mg/dL — ABNORMAL LOW (ref 70–99)
Glucose-Capillary: 63 mg/dL — ABNORMAL LOW (ref 70–99)

## 2022-05-15 LAB — CBC WITH DIFFERENTIAL/PLATELET
Abs Immature Granulocytes: 0.14 10*3/uL — ABNORMAL HIGH (ref 0.00–0.07)
Basophils Absolute: 0 10*3/uL (ref 0.0–0.1)
Basophils Relative: 0 %
Eosinophils Absolute: 1 10*3/uL — ABNORMAL HIGH (ref 0.0–0.5)
Eosinophils Relative: 8 %
HCT: 26 % — ABNORMAL LOW (ref 39.0–52.0)
Hemoglobin: 8.7 g/dL — ABNORMAL LOW (ref 13.0–17.0)
Immature Granulocytes: 1 %
Lymphocytes Relative: 10 %
Lymphs Abs: 1.3 10*3/uL (ref 0.7–4.0)
MCH: 30.7 pg (ref 26.0–34.0)
MCHC: 33.5 g/dL (ref 30.0–36.0)
MCV: 91.9 fL (ref 80.0–100.0)
Monocytes Absolute: 1.2 10*3/uL — ABNORMAL HIGH (ref 0.1–1.0)
Monocytes Relative: 10 %
Neutro Abs: 9.1 10*3/uL — ABNORMAL HIGH (ref 1.7–7.7)
Neutrophils Relative %: 71 %
Platelets: 221 10*3/uL (ref 150–400)
RBC: 2.83 MIL/uL — ABNORMAL LOW (ref 4.22–5.81)
RDW: 16.7 % — ABNORMAL HIGH (ref 11.5–15.5)
WBC: 12.7 10*3/uL — ABNORMAL HIGH (ref 4.0–10.5)
nRBC: 0.9 % — ABNORMAL HIGH (ref 0.0–0.2)

## 2022-05-15 LAB — CBC
HCT: 24.2 % — ABNORMAL LOW (ref 39.0–52.0)
HCT: 25.4 % — ABNORMAL LOW (ref 39.0–52.0)
Hemoglobin: 8.4 g/dL — ABNORMAL LOW (ref 13.0–17.0)
Hemoglobin: 8.5 g/dL — ABNORMAL LOW (ref 13.0–17.0)
MCH: 31.6 pg (ref 26.0–34.0)
MCH: 31.6 pg (ref 26.0–34.0)
MCHC: 34.7 g/dL (ref 30.0–36.0)
MCHC: 33.5 g/dL (ref 30.0–36.0)
MCV: 91 fL (ref 80.0–100.0)
MCV: 94.4 fL (ref 80.0–100.0)
Platelets: 245 10*3/uL (ref 150–400)
Platelets: 251 10*3/uL (ref 150–400)
RBC: 2.66 MIL/uL — ABNORMAL LOW (ref 4.22–5.81)
RBC: 2.69 MIL/uL — ABNORMAL LOW (ref 4.22–5.81)
RDW: 15.5 % (ref 11.5–15.5)
RDW: 16.1 % — ABNORMAL HIGH (ref 11.5–15.5)
WBC: 11.2 10*3/uL — ABNORMAL HIGH (ref 4.0–10.5)
WBC: 14.1 10*3/uL — ABNORMAL HIGH (ref 4.0–10.5)
nRBC: 0.3 % — ABNORMAL HIGH (ref 0.0–0.2)
nRBC: 0.2 % (ref 0.0–0.2)

## 2022-05-15 LAB — GLUCOSE, RANDOM
Glucose, Bld: 132 mg/dL — ABNORMAL HIGH (ref 70–99)
Glucose, Bld: 118 mg/dL — ABNORMAL HIGH (ref 70–99)

## 2022-05-15 LAB — PREPARE RBC (CROSSMATCH)

## 2022-05-15 MED ORDER — DEXTROSE 10 % IV SOLN: INTRAVENOUS | Status: DC

## 2022-05-15 MED ORDER — TRANEXAMIC ACID FOR EPISTAXIS
500.0000 mg | Freq: Once | TOPICAL | Status: DC
  Filled 2022-05-15: qty 10

## 2022-05-15 MED ORDER — SODIUM CHLORIDE 0.9% IV SOLUTION: Freq: Once | INTRAVENOUS | Status: AC

## 2022-05-15 MED ORDER — IPRATROPIUM-ALBUTEROL 0.5-2.5 (3) MG/3ML IN SOLN: 3.0000 mL | RESPIRATORY_TRACT | Status: DC | PRN

## 2022-05-15 NOTE — ED Notes (Signed)
Provider aware of BG & blood salvia output and is on his way to assess the patient.

## 2022-05-15 NOTE — Progress Notes (Signed)
Hypoglycemic Event  CBG: 39  Treatment: D50 50 mL (25 gm) And 12oz coke Symptoms: Nervous/irritable  Follow-up CBG: Time:1621 CBG Result:102  Possible Reasons for Event: Inadequate meal intake  Comments/MD notified:Dr Tyler Deis

## 2022-05-15 NOTE — ED Notes (Signed)
ED TO INPATIENT HANDOFF REPORT  ED Nurse Name and Phone #: Duanne Guess [phone]  S Name/Age/Gender Kevin Schwartz 89 y.o. male Room/Bed: 014C/014C  Code Status   Code Status: Full Code   Triage Complete: Triage complete  Chief Complaint Syncope [R55]  Triage Note Pt BIB [facility] from [place]. Son witness pt having syncope episode after he became unsteady after trying to get up out the wheelchair to stand up. Pt began steering off into space for 3 minutes and not responding. EMS reported Pt had 7 tooth extracted on Thursday  and he is on Eliquis and been having little bleeding from his mouth. Pt had headache for two hours. Pt is now A & O x 4.   Allergies Allergies  Allergen Reactions   Lisinopril Swelling and Other (See Comments)    Angioedema    Losartan Potassium Swelling and Other (See Comments)    Angioedema    Crestor [Rosuvastatin] Swelling and Other (See Comments)    Angioedema    Tape Other (See Comments)    Irritates the skin    Level of Care/Admitting Diagnosis ED Disposition     ED Disposition  Admit   Condition  --   [place]: [place] [[id number]]  Level of Care: Progressive [102]  Admit to Progressive based on following criteria: GI, ENDOCRINE disease patients with GI bleeding, acute liver failure or pancreatitis, stable with diabetic ketoacidosis or thyrotoxicosis (hypothyroid) state.  May admit patient to [facility] or [facility] if equivalent level of care is available:: Yes  Covid Evaluation: Asymptomatic - no recent exposure (last 10 days) testing not required  Diagnosis: Syncope [206001]  Admitting Physician: Lorelei Pont [[id number]]  Attending Physician: Lorelei Pont [[id number]]  Certification:: I certify this patient will need inpatient services for at least 2 midnights  Estimated Length of Stay: 2          B Medical/Surgery History Past Medical History:  Diagnosis Date    Anemia due to chronic kidney disease    Bilateral inguinal hernia    CAD (coronary artery disease) cardiologist-  dr bensimhon   PTCA of OM in 1998, [place] OM in 2000, Cath 9/07 LM ok LAD ok. LCX 95% in OM prior to previous stent RCA. nondominant normal EF normal. [place] DES to OM 2007 (PLACED PROXIAMAL TO PREVIOUS STENT)   Chronic kidney disease, stage III (moderate) ([place])    nephrologist-  dr detarding   Depression    Diabetes mellitus type 2, diet-controlled ([place])    followed by pcp,  last A1c 5.2 on 09-10-2017 in epic   Gout, unspecified    10-29-2017  per pt stable   HLD (hyperlipidemia)    HTN (hypertension)    MGUS (monoclonal gammopathy of unknown significance) previously followed by dr Beryle Beams , [facility] and released 08/01/2011   IgG kappa dx 2002 8% plasma cells in bone marrow; no lesions on bone X-rays;   Nocturia    OA (osteoarthritis)    OSA on CPAP    per study 01-30-2004  severe osa , AHI 51.6/hr   PAD (peripheral artery disease) ([place])    05-21-2006  left renal artery stenosis, s/p balloon angioplasty and stenting;  last duplex 02/ 2012  normal , arteries patent   S/P coronary artery stent placement    2000--  BMS x1  to OM;   2007-- DES x1  to OM proximal to previous stent   Secondary hyperparathyroidism of renal origin (  [place])    Urgency of urination    Wears glasses    Past Surgical History:  Procedure Laterality Date   CARDIOVASCULAR STRESS TEST  2010   per dr bensimhon epic note dated 04-18-2016  normal   CORONARY ANGIOPLASTY  1998   PTCA to [place]   PCI and BMS x1 OM   CORONARY ANGIOPLASTY WITH STENT PLACEMENT  12-18-2005  dr Claiborne Billings   DES x1 to proximal to previously placed stent in OM   CORONARY BALLOON ANGIOPLASTY N/A 08/02/2020   Procedure: CORONARY BALLOON ANGIOPLASTY;  Surgeon: Troy Sine, MD;  Location: [facility] [facility];  Service: Cardiovascular;  Laterality: N/A;   CORONARY STENT INTERVENTION N/A  08/02/2020   Procedure: CORONARY STENT INTERVENTION;  Surgeon: Troy Sine, MD;  Location: [facility];  Service: Cardiovascular;  Laterality: N/A;   HERNIA REPAIR     INGUINAL HERNIA REPAIR Bilateral 10/30/2017   Procedure: LAPAROSCOPIC  BILATERAL INGUINAL HERNIA REPAIR  AND LEFT [place];  Surgeon: Michael Boston, MD;  Location: [facility];  Service: General;  Laterality: Bilateral;   INSERTION OF MESH Bilateral 10/30/2017   Procedure: INSERTION OF MESH;  Surgeon: Michael Boston, MD;  Location: [facility];  Service: General;  Laterality: Bilateral;   LEFT HEART CATH AND CORONARY ANGIOGRAPHY N/A 08/01/2020   Procedure: LEFT HEART CATH AND CORONARY ANGIOGRAPHY;  Surgeon: Troy Sine, MD;  Location: [facility];  Service: Cardiovascular;  Laterality: N/A;   RENAL ANGIOPLASTY Left 05-21-2006   dr berry   and stenting   TOTAL KNEE ARTHROPLASTY Left 07-22-2005   dr Shellia Carwin  [facility]   TRANSTHORACIC ECHOCARDIOGRAM  04/18/2016   ef 96-04%, grade 1 diastolic dysfunction/  mild to moderate AR/  mild MR     A IV Location/Drains/Wounds Patient Lines/Drains/Airways Status     Active Line/Drains/Airways     Name Placement date Placement time Site Days   Peripheral IV 05/14/22 20 G Anterior;Left Forearm 05/14/22  1455  Forearm  1   Peripheral IV 05/14/22 20 G Anterior;Right Forearm 05/14/22  1623  Forearm  1   Wound / Incision (Open or Dehisced) 07/12/21 Non-pressure wound Heel Left;Lateral round, dry ulcer 07/12/21  1152  Heel  307            Intake/Output Last 24 hours  Intake/Output Summary (Last 24 hours) at 05/15/2022 1042 Last data filed at 05/15/2022 0900 Gross per 24 hour  Intake 1028.33 ml  Output 300 ml  Net 728.33 ml    Labs/Imaging Results for orders placed or performed during the hospital encounter of 05/14/22 (from the past 48 hour(s))  Comprehensive metabolic panel     Status: Abnormal   Collection Time: 05/14/22   1:55 PM  Result Value Ref Range   Sodium 136 135 - 145 mmol/L   Potassium 4.8 3.5 - 5.1 mmol/L   Chloride 100 98 - 111 mmol/L   CO2 23 22 - 32 mmol/L   Glucose, Bld 116 (H) 70 - 99 mg/dL    Comment: Glucose reference range applies only to samples taken after fasting for at least 8 hours.   BUN 80 (H) 8 - 23 mg/dL   Creatinine, Ser 2.67 (H) 0.61 - 1.24 mg/dL   Calcium 8.6 (L) 8.9 - 10.3 mg/dL   Total Protein 7.6 6.5 - 8.1 g/dL   Albumin 3.8 3.5 - 5.0 g/dL   AST 39 15 -  41 U/L   ALT 21 0 - 44 U/L   Alkaline Phosphatase 61 38 - 126 U/L   Total Bilirubin 0.7 0.3 - 1.2 mg/dL   GFR, Estimated 22 (L) >60 mL/min    Comment: (NOTE) Calculated using the CKD-EPI Creatinine Equation (2021)    Anion gap 13 5 - 15    Comment: Performed at [facility] [address], [place], [place] [address]  CBC WITH DIFFERENTIAL     Status: Abnormal   Collection Time: 05/14/22  1:55 PM  Result Value Ref Range   WBC 10.0 4.0 - 10.5 K/uL   RBC 2.18 (L) 4.22 - 5.81 MIL/uL   Hemoglobin 6.9 (LL) 13.0 - 17.0 g/dL    Comment: REPEATED TO VERIFY THIS CRITICAL RESULT HAS VERIFIED AND BEEN CALLED TO Guerry Bruin, RN BY LEAH KLAR ON 01 31 2024 AT 1457, AND HAS BEEN READ BACK.     HCT 21.9 (L) 39.0 - 52.0 %   MCV 100.5 (H) 80.0 - 100.0 fL   MCH 31.7 26.0 - 34.0 pg   MCHC 31.5 30.0 - 36.0 g/dL   RDW 15.6 (H) 11.5 - 15.5 %   Platelets 283 150 - 400 K/uL    Comment: REPEATED TO VERIFY   nRBC 0.3 (H) 0.0 - 0.2 %   Neutrophils Relative % 84 %   Neutro Abs 8.5 (H) 1.7 - 7.7 K/uL   Lymphocytes Relative 8 %   Lymphs Abs 0.8 0.7 - 4.0 K/uL   Monocytes Relative 6 %   Monocytes Absolute 0.6 0.1 - 1.0 K/uL   Eosinophils Relative 1 %   Eosinophils Absolute 0.1 0.0 - 0.5 K/uL   Basophils Relative 0 %   Basophils Absolute 0.0 0.0 - 0.1 K/uL   Immature Granulocytes 1 %   Abs Immature Granulocytes 0.07 0.00 - 0.07 K/uL    Comment: Performed at [facility], [address], [place], [place] [address]   Protime-INR     Status: Abnormal   Collection Time: 05/14/22  1:55 PM  Result Value Ref Range   Prothrombin Time 16.3 (H) 11.4 - 15.2 seconds   INR 1.3 (H) 0.8 - 1.2    Comment: (NOTE) INR goal varies based on device and disease states. Performed at [facility], [place] [address] [place], [place] [address]   CBG monitoring, ED     Status: Abnormal   Collection Time: 05/14/22  2:44 PM  Result Value Ref Range   Glucose-Capillary 29 (LL) 70 - 99 mg/dL    Comment: Glucose reference range applies only to samples taken after fasting for at least 8 hours.   Comment 1 Notify RN    Comment 2 Document in Chart   CBG monitoring, ED     Status: Abnormal   Collection Time: 05/14/22  3:15 PM  Result Value Ref Range   Glucose-Capillary 32 (LL) 70 - 99 mg/dL    Comment: Glucose reference range applies only to samples taken after fasting for at least 8 hours.   Comment 1 Notify RN   Prepare RBC (crossmatch)     Status: None   Collection Time: 05/14/22  3:16 PM  Result Value Ref Range   Order Confirmation      ORDER PROCESSED BY BLOOD BANK Performed at [facility], [place] [address] [place], [place] [address]   CBG monitoring, ED     Status: Abnormal   Collection Time: 05/14/22  3:59 PM  Result Value Ref  Range   Glucose-Capillary 169 (H) 70 - 99 mg/dL    Comment: Glucose reference range applies only to samples taken after fasting for at least 8 hours.  Type and screen [place]     Status: None (Preliminary result)   Collection Time: 05/14/22  4:24 PM  Result Value Ref Range   ABO/RH(D) B POS    Antibody Screen NEG    Sample Expiration 05/17/2022,2359    Unit Number [id number]    Blood Component Type RED CELLS,LR    Unit division 00    Status of Unit ISSUED    Transfusion Status OK TO TRANSFUSE    Crossmatch Result Compatible    Unit Number [id number]    Blood Component Type RED CELLS,LR    Unit division 00    Status of Unit ISSUED    Transfusion  Status OK TO TRANSFUSE    Crossmatch Result Compatible    Unit Number [id number]    Blood Component Type RED CELLS,LR    Unit division 00    Status of Unit ISSUED    Transfusion Status OK TO TRANSFUSE    Crossmatch Result      Compatible Performed at [facility], [place] [address], [place], [place] [address]   CBG monitoring, ED     Status: Abnormal   Collection Time: 05/14/22  6:19 PM  Result Value Ref Range   Glucose-Capillary 129 (H) 70 - 99 mg/dL    Comment: Glucose reference range applies only to samples taken after fasting for at least 8 hours.  Hemoglobin A1c     Status: None   Collection Time: 05/14/22  6:48 PM  Result Value Ref Range   Hgb A1c MFr Bld 5.6 4.8 - 5.6 %    Comment: (NOTE) Pre diabetes:          5.7%-6.4%  Diabetes:              >6.4%  Glycemic control for   <7.0% adults with diabetes    Mean Plasma Glucose 114.02 mg/dL    Comment: Performed at [facility] [address] [place], [place] [address]  Comprehensive metabolic panel     Status: Abnormal   Collection Time: 05/14/22  6:48 PM  Result Value Ref Range   Sodium 137 135 - 145 mmol/L   Potassium 4.6 3.5 - 5.1 mmol/L   Chloride 101 98 - 111 mmol/L   CO2 20 (L) 22 - 32 mmol/L   Glucose, Bld 180 (H) 70 - 99 mg/dL    Comment: Glucose reference range applies only to samples taken after fasting for at least 8 hours.   BUN 83 (H) 8 - 23 mg/dL   Creatinine, Ser 2.74 (H) 0.61 - 1.24 mg/dL   Calcium 8.6 (L) 8.9 - 10.3 mg/dL   Total Protein 6.9 6.5 - 8.1 g/dL   Albumin 3.6 3.5 - 5.0 g/dL   AST 34 15 - 41 U/L   ALT 17 0 - 44 U/L   Alkaline Phosphatase 57 38 - 126 U/L   Total Bilirubin 0.5 0.3 - 1.2 mg/dL   GFR, Estimated 21 (L) >60 mL/min    Comment: (NOTE) Calculated using the CKD-EPI Creatinine Equation (2021)    Anion gap 16 (H) 5 - 15    Comment: Performed at [facility], [place] [address], [place], [place] [address]  CBC     Status: Abnormal   Collection Time: 05/14/22   6:48 PM  Result  Value Ref Range   WBC 9.5 4.0 - 10.5 K/uL   RBC 2.14 (L) 4.22 - 5.81 MIL/uL   Hemoglobin 6.7 (LL) 13.0 - 17.0 g/dL    Comment: REPEATED TO VERIFY THIS CRITICAL RESULT HAS VERIFIED AND BEEN CALLED TO NADINE WELLINGTON, RN BY SHAY EKDAHL ON 01 31 2024 AT 1921, AND HAS BEEN READ BACK.     HCT 22.8 (L) 39.0 - 52.0 %   MCV 106.5 (H) 80.0 - 100.0 fL   MCH 31.3 26.0 - 34.0 pg   MCHC 29.4 (L) 30.0 - 36.0 g/dL   RDW 15.5 11.5 - 15.5 %   Platelets 302 150 - 400 K/uL   nRBC 0.2 0.0 - 0.2 %    Comment: Performed at [facility] [address] [place], [place] [address]  Creatinine, urine, random     Status: None   Collection Time: 05/14/22  8:45 PM  Result Value Ref Range   Creatinine, Urine 41 mg/dL    Comment: Performed at [facility] [address] [place], [place] [address]  Sodium, urine, random     Status: None   Collection Time: 05/14/22  8:45 PM  Result Value Ref Range   Sodium, Ur 78 mmol/L    Comment: Performed at [facility] [address] [place], [place] [address]  Urinalysis, Routine w reflex microscopic -Urine, Clean Catch     Status: Abnormal   Collection Time: 05/14/22  8:45 PM  Result Value Ref Range   Color, Urine STRAW (A) YELLOW   APPearance CLEAR CLEAR   Specific Gravity, Urine 1.009 1.005 - 1.030   pH 5.0 5.0 - 8.0   Glucose, UA 50 (A) NEGATIVE mg/dL   Hgb urine dipstick NEGATIVE NEGATIVE   Bilirubin Urine NEGATIVE NEGATIVE   Ketones, ur NEGATIVE NEGATIVE mg/dL   Protein, ur NEGATIVE NEGATIVE mg/dL   Nitrite NEGATIVE NEGATIVE   Leukocytes,Ua NEGATIVE NEGATIVE    Comment: Performed at [facility] [address] [place], [place] [address]  Lactic acid, plasma     Status: Abnormal   Collection Time: 05/14/22  8:50 PM  Result Value Ref Range   Lactic Acid, Venous 3.1 (HH) 0.5 - 1.9 mmol/L    Comment: CRITICAL RESULT CALLED TO, READ BACK BY AND VERIFIED WITH Carroll Kinds RN 05/14/22 [place] Performed at  [facility] [address], [place], [place] [address]   CBG monitoring, ED     Status: Abnormal   Collection Time: 05/14/22  8:54 PM  Result Value Ref Range   Glucose-Capillary 107 (H) 70 - 99 mg/dL    Comment: Glucose reference range applies only to samples taken after fasting for at least 8 hours.  CBG monitoring, ED     Status: Abnormal   Collection Time: 05/14/22 10:38 PM  Result Value Ref Range   Glucose-Capillary 66 (L) 70 - 99 mg/dL    Comment: Glucose reference range applies only to samples taken after fasting for at least 8 hours.  CBG monitoring, ED     Status: Abnormal   Collection Time: 05/14/22 11:59 PM  Result Value Ref Range   Glucose-Capillary 62 (L) 70 - 99 mg/dL    Comment: Glucose reference range applies only to samples taken after fasting for at least 8 hours.  CBG monitoring, ED     Status: Abnormal   Collection Time: 05/15/22 12:34 AM  Result Value Ref Range   Glucose-Capillary 63 (L) 70 -  99 mg/dL    Comment: Glucose reference range applies only to samples taken after fasting for at least 8 hours.  CBG monitoring, ED     Status: Abnormal   Collection Time: 05/15/22  1:11 AM  Result Value Ref Range   Glucose-Capillary 105 (H) 70 - 99 mg/dL    Comment: Glucose reference range applies only to samples taken after fasting for at least 8 hours.  CBG monitoring, ED     Status: Abnormal   Collection Time: 05/15/22  3:06 AM  Result Value Ref Range   Glucose-Capillary 107 (H) 70 - 99 mg/dL    Comment: Glucose reference range applies only to samples taken after fasting for at least 8 hours.  Comprehensive metabolic panel     Status: Abnormal   Collection Time: 05/15/22  3:09 AM  Result Value Ref Range   Sodium 138 135 - 145 mmol/L   Potassium 4.0 3.5 - 5.1 mmol/L   Chloride 104 98 - 111 mmol/L   CO2 23 22 - 32 mmol/L   Glucose, Bld 136 (H) 70 - 99 mg/dL    Comment: Glucose reference range applies only to samples taken after fasting for at least 8 hours.    BUN 81 (H) 8 - 23 mg/dL   Creatinine, Ser 2.39 (H) 0.61 - 1.24 mg/dL   Calcium 8.2 (L) 8.9 - 10.3 mg/dL   Total Protein 6.4 (L) 6.5 - 8.1 g/dL   Albumin 3.2 (L) 3.5 - 5.0 g/dL   AST 26 15 - 41 U/L   ALT 16 0 - 44 U/L   Alkaline Phosphatase 50 38 - 126 U/L   Total Bilirubin 0.5 0.3 - 1.2 mg/dL   GFR, Estimated 25 (L) >60 mL/min    Comment: (NOTE) Calculated using the CKD-EPI Creatinine Equation (2021)    Anion gap 11 5 - 15    Comment: Performed at [facility], [place] [address], [place], [place] [address]  CBC     Status: Abnormal   Collection Time: 05/15/22  3:09 AM  Result Value Ref Range   WBC 11.2 (H) 4.0 - 10.5 K/uL   RBC 2.66 (L) 4.22 - 5.81 MIL/uL   Hemoglobin 8.4 (L) 13.0 - 17.0 g/dL    Comment: REPEATED TO VERIFY POST TRANSFUSION SPECIMEN    HCT 24.2 (L) 39.0 - 52.0 %   MCV 91.0 80.0 - 100.0 fL    Comment: POST TRANSFUSION SPECIMEN REPEATED TO VERIFY    MCH 31.6 26.0 - 34.0 pg   MCHC 34.7 30.0 - 36.0 g/dL   RDW 15.5 11.5 - 15.5 %   Platelets 245 150 - 400 K/uL   nRBC 0.3 (H) 0.0 - 0.2 %    Comment: Performed at [facility], [place] [address] [place], [place] [address]  CBG monitoring, ED     Status: Abnormal   Collection Time: 05/15/22  8:23 AM  Result Value Ref Range   Glucose-Capillary 52 (L) 70 - 99 mg/dL    Comment: Glucose reference range applies only to samples taken after fasting for at least 8 hours.  Prepare RBC (crossmatch)     Status: None   Collection Time: 05/15/22  8:43 AM  Result Value Ref Range   Order Confirmation      ORDER PROCESSED BY BLOOD BANK Performed at [facility], [place] [address], [place], [place] [address]   CBC     Status: Abnormal   Collection Time: 05/15/22  9:27 AM  Result  Value Ref Range   WBC 14.1 (H) 4.0 - 10.5 K/uL   RBC 2.69 (L) 4.22 - 5.81 MIL/uL   Hemoglobin 8.5 (L) 13.0 - 17.0 g/dL   HCT 25.4 (L) 39.0 - 52.0 %   MCV 94.4 80.0 - 100.0 fL   MCH 31.6 26.0 - 34.0 pg   MCHC 33.5 30.0 - 36.0 g/dL   RDW  16.1 (H) 11.5 - 15.5 %   Platelets 251 150 - 400 K/uL   nRBC 0.2 0.0 - 0.2 %    Comment: Performed at [facility] [address], [place], [place] [address]  Glucose, random     Status: Abnormal   Collection Time: 05/15/22  9:27 AM  Result Value Ref Range   Glucose, Bld 132 (H) 70 - 99 mg/dL    Comment: Glucose reference range applies only to samples taken after fasting for at least 8 hours. Performed at [facility], [place] [address], [place], [place] [address]    CT ABDOMEN PELVIS WO CONTRAST  Result Date: 05/14/2022 CLINICAL DATA:  Abdominal pain EXAM: CT ABDOMEN AND PELVIS WITHOUT CONTRAST TECHNIQUE: Multidetector CT imaging of the abdomen and pelvis was performed following the standard protocol without IV contrast. RADIATION DOSE REDUCTION: This exam was performed according to the departmental dose-optimization program which includes automated exposure control, adjustment of the mA and/or kV according to patient size and/or use of iterative reconstruction technique. COMPARISON:  09/30/2010 FINDINGS: Lower chest: Linear bibasilar subsegmental atelectasis or scarring. Cardiomegaly with dense atheromatous calcifications of coronary arteries and aorta. Bilateral gynecomastia. Small hiatal hernia. No pleural effusion or pericardial effusion. Hepatobiliary: No focal liver abnormality is seen. No gallstones, gallbladder wall thickening, or biliary dilatation. Pancreas: Unremarkable. No pancreatic ductal dilatation or surrounding inflammatory changes. Spleen: Normal in size without focal abnormality. Adrenals/Urinary Tract: Adrenal glands are unremarkable. Kidneys are normal, without renal calculi, focal lesion, or hydronephrosis. Bladder is unremarkable. Stomach/Bowel: Stomach is within normal limits. Appendix not discretely visualized and there is no suggestion of appendicitis. Normal. No evidence of bowel wall thickening, distention, or inflammatory changes. Vascular/Lymphatic: Dense  atheromatous calcifications noted for the abdominal aorta and its branches. Left renal artery stent. Distal abdominal aortic aneurysm with an outer diameter 3 cm. Typically this finding can be followed after a 3 year interval. Reproductive: Prostate is unremarkable. Other: No abdominal wall hernia or abnormality. No abdominopelvic ascites. Musculoskeletal: Thoracolumbosacral degenerative changes. Grade 1 L5 retrolisthesis. Vacuum disc at L2-3 and L3-4. IMPRESSION: 1. No acute abdominal or pelvic pathology identified. 2. Cardiomegaly. 3. Small hiatal hernia. 4. Bilateral gynecomastia. Four distal abdominal aortic aneurysm, 3 cm. 5. Dense atheromatous changes of the aorta and its branches. 6. Thoracolumbosacral degenerative disc disease. Electronically Signed   By: Sammie Bench M.D.   On: 05/14/2022 20:41   CT HEAD WO CONTRAST  Result Date: 05/14/2022 CLINICAL DATA:  Altered mental status, syncopal episode EXAM: CT HEAD WITHOUT CONTRAST TECHNIQUE: Contiguous axial images were obtained from the base of the skull through the vertex without intravenous contrast. RADIATION DOSE REDUCTION: This exam was performed according to the departmental dose-optimization program which includes automated exposure control, adjustment of the mA and/or kV according to patient size and/or use of iterative reconstruction technique. COMPARISON:  07/22/2021 FINDINGS: Brain: No evidence of acute infarction, hemorrhage, hydrocephalus, extra-axial collection or mass lesion/mass effect. Periventricular and deep white matter hypodensity. Vascular: No hyperdense vessel or unexpected calcification. Skull: Normal. Negative for fracture or focal lesion. Sinuses/Orbits: No acute finding. Other: None. IMPRESSION: No  acute intracranial pathology. Small-vessel white matter disease in keeping with advanced patient age. Electronically Signed   By: Delanna Ahmadi M.D.   On: 05/14/2022 14:27   DG Chest Port 1 View  Result Date: 05/14/2022 CLINICAL  DATA:  Loss of consciousness. EXAM: PORTABLE CHEST 1 VIEW COMPARISON:  07/22/2021 FINDINGS: Stable cardiac enlargement. Mild scarring/atelectasis at both lung bases. There is no evidence of pulmonary edema, consolidation, pneumothorax or pleural fluid. Visualized bony structures are unremarkable. IMPRESSION: Stable cardiac enlargement. Mild scarring/atelectasis at both lung bases. Electronically Signed   By: Aletta Edouard M.D.   On: 05/14/2022 13:45    Pending Labs Unresulted Labs (From admission, onward)     Start     Ordered   05/15/22 0200  CBC  Now then every 8 hours,   R (with TIMED occurrences)      05/14/22 1748            Vitals/Pain Today's Vitals   05/15/22 0909 05/15/22 0930 05/15/22 0945 05/15/22 1000  BP: (!) 124/51 128/78 126/76 133/67  Pulse: 75 (!) 47 77   Resp: '20 17 18 14  '$ Temp: 97.7 F (36.5 C) 97.7 F (36.5 C)    TempSrc: Oral Oral    SpO2:  98% 99%   Weight:      Height:      PainSc:        Isolation Precautions No active isolations  Medications Medications  levothyroxine (SYNTHROID) tablet 25 mcg (25 mcg Oral Given 05/15/22 0650)  ipratropium-albuterol (DUONEB) 0.5-2.5 (3) MG/3ML nebulizer solution 3 mL (3 mLs Inhalation Given 05/15/22 0735)  dextrose 50 % solution 25 g (25 g Intravenous Not Given 05/14/22 1920)  dextrose 10 % infusion ( Intravenous New Bag/Given 05/15/22 1029)  dextrose 50 % solution 50 mL (50 mLs Intravenous Given 05/14/22 1457)  0.9 %  sodium chloride infusion (Manually program via Guardrails IV Fluids) (0 mLs Intravenous Stopped 05/15/22 0046)  dextrose 50 % solution 50 mL (50 mLs Intravenous Given 05/14/22 1531)  tranexamic acid (CYKLOKAPRON) 1000 MG/10ML nebulizer solution 500 mg (500 mg Nebulization Given 05/14/22 1556)  0.9 %  sodium chloride infusion (Manually program via Guardrails IV Fluids) ( Intravenous New Bag/Given 05/15/22 4128)    Mobility walks with person assist     Focused Assessments Dental / oral  bleeding   R Recommendations: See Admitting Provider Note  Report given to:   Additional Notes: last meal at 0830- oatmeal and orange juice from breakfast. Provider wants to keep NPO.

## 2022-05-15 NOTE — Progress Notes (Signed)
PROGRESS NOTE    Kevin Schwartz  HFW:[id number] DOB: 08-22-1930 DOA: 05/14/2022 PCP: [name], MD    Brief Narrative:  89 year old with history of coronary artery disease status post non-STEMI, peripheral artery disease, CKD, type 2 diabetes, hypertension hyperlipidemia and A-fib presented from home with lightheadedness and collapsed and reportedly unresponsive for about 3 minutes.  No injuries reported.  He had dental extraction last Thursday and has been bleeding from his mouth since then.  In the emergency room hemodynamically stable.  Blood pressures stable.  Hemoglobin was 6.7 with known hemoglobin baseline of about 9.   Assessment & Plan:   Syncope secondary to acute blood loss anemia and hypoglycemia: Patient on Eliquis which he has been holding for last 10 days, was held 4 days prior to dental extraction.  -Nebulized tranexamic acid,did not respond.  -Hemoglobin 6.9-2 units of PRBC given-appropriately responded to 8.4. still bleeding- 1 more unit now.  -Case discussed with oral surgery, Dr. Mancel Parsons who recommended pressure and packing. Check orthostatic blood pressures after bleeding stops.   Hypoglycemia: Ongoing use of Jardiance with low oral intake.  Currently on maintenance dextrose drip.  Will encourage oral intake and gradually wean off dextrose.  Can continue to hold any oral hypoglycemics until good oral intake.  CKD stage IV: At about baseline.  Continue monitor.  Chronic combined heart failure: Currently euvolemic.  Holding diuretics.  Hypothyroidism: On Synthroid.  Coronary artery disease holding aspirin and Eliquis.  Holding a statin and Zetia.  Holding [place].  Acute abdominal pain: Currently resolved.  CT scan abdomen pelvis without any evidence of acute findings including bleeding.  I called and discussed case with oral surgery.  They recommended packing with thrombin soaked gauze and monitor.  If continues to bleed, will request per onsite  consultation.   DVT prophylaxis: SCDs Start: 05/14/22 1644   Code Status: Full code Family Communication: Patient's son on the phone. Disposition Plan: Status is: Inpatient Remains inpatient appropriate because: Active oral bleeding.  Blood transfusions.     Consultants:  Oral surgery.  Procedures:  None  Antimicrobials:  None   Subjective: Patient seen multiple times today.  Ordered third unit of PRBC.  Patient himself denies any complaints.  No more abdominal pain.  Continues to have bleeding through the gums and using suction.  There is about 400 mL of fresh blood in his suction canister.  Noted fingerstick blood sugar of 52, patient was asymptomatic.  Verified with serum glucose which was 132.  Objective: Vitals:   05/15/22 0945 05/15/22 1000 05/15/22 1203 05/15/22 1238  BP: 126/76 133/67 116/78 (!) 129/59  Pulse: 77  78 84  Resp: '18 14 20 16  '$ Temp:   98.1 F (36.7 C)   TempSrc:   Oral   SpO2: 99%   100%  Weight:      Height:        Intake/Output Summary (Last 24 hours) at 05/15/2022 1309 Last data filed at 05/15/2022 1204 Gross per 24 hour  Intake 1465.83 ml  Output 300 ml  Net 1165.83 ml   Filed Weights   05/14/22 1417  Weight: 83.5 kg    Examination:  General exam: Appears calm and comfortable, appropriately anxious.  On room air. Respiratory system: Clear to auscultation. Respiratory effort normal. Cardiovascular system: S1 & S2 heard, RRR. No pedal edema. Gastrointestinal system: Abdomen is nondistended, soft and nontender. No organomegaly or masses felt. Normal bowel sounds heard. Central nervous system: Alert and oriented. No focal neurological deficits.  Generalized weakness.  Oral cavity He has diffuse bleeding from right upper gum, unable to visualize any bleeding points.  There is trace blood and blood clots.    Data Reviewed: I have personally reviewed following labs and imaging studies  CBC: Recent Labs  Lab 05/14/22 1355  05/14/22 1848 05/15/22 0309 05/15/22 0927  WBC 10.0 9.5 11.2* 14.1*  NEUTROABS 8.5*  --   --   --   HGB 6.9* 6.7* 8.4* 8.5*  HCT 21.9* 22.8* 24.2* 25.4*  MCV 100.5* 106.5* 91.0 94.4  PLT 283 302 245 097   Basic Metabolic Panel: Recent Labs  Lab 05/14/22 1355 05/14/22 1848 05/15/22 0309 05/15/22 0927  NA 136 137 138  --   K 4.8 4.6 4.0  --   CL 100 101 104  --   CO2 23 20* 23  --   GLUCOSE 116* 180* 136* 132*  BUN 80* 83* 81*  --   CREATININE 2.67* 2.74* 2.39*  --   CALCIUM 8.6* 8.6* 8.2*  --    GFR: Estimated Creatinine Clearance: 22.1 mL/min (A) (by C-G formula based on SCr of 2.39 mg/dL (H)). Liver Function Tests: Recent Labs  Lab 05/14/22 1355 05/14/22 1848 05/15/22 0309  AST 39 34 26  ALT '21 17 16  '$ ALKPHOS 61 57 50  BILITOT 0.7 0.5 0.5  PROT 7.6 6.9 6.4*  ALBUMIN 3.8 3.6 3.2*   No results for input(s): "LIPASE", "AMYLASE" in the last 168 hours. No results for input(s): "AMMONIA" in the last 168 hours. Coagulation Profile: Recent Labs  Lab 05/14/22 1355  INR 1.3*   Cardiac Enzymes: No results for input(s): "CKTOTAL", "CKMB", "CKMBINDEX", "TROPONINI" in the last 168 hours. BNP (last 3 results) No results for input(s): "PROBNP" in the last 8760 hours. HbA1C: Recent Labs    05/14/22 1848  HGBA1C 5.6   CBG: Recent Labs  Lab 05/14/22 2359 05/15/22 0034 05/15/22 0111 05/15/22 0306 05/15/22 0823  GLUCAP 62* 63* 105* 107* 52*   Lipid Profile: No results for input(s): "CHOL", "HDL", "LDLCALC", "TRIG", "CHOLHDL", "LDLDIRECT" in the last 72 hours. Thyroid Function Tests: No results for input(s): "TSH", "T4TOTAL", "FREET4", "T3FREE", "THYROIDAB" in the last 72 hours. Anemia Panel: No results for input(s): "VITAMINB12", "FOLATE", "FERRITIN", "TIBC", "IRON", "RETICCTPCT" in the last 72 hours. Sepsis Labs: Recent Labs  Lab 05/14/22 2050  LATICACIDVEN 3.1*    No results found for this or any previous visit (from the past 240 hour(s)).        Radiology Studies: CT ABDOMEN PELVIS WO CONTRAST  Result Date: 05/14/2022 CLINICAL DATA:  Abdominal pain EXAM: CT ABDOMEN AND PELVIS WITHOUT CONTRAST TECHNIQUE: Multidetector CT imaging of the abdomen and pelvis was performed following the standard protocol without IV contrast. RADIATION DOSE REDUCTION: This exam was performed according to the departmental dose-optimization program which includes automated exposure control, adjustment of the mA and/or kV according to patient size and/or use of iterative reconstruction technique. COMPARISON:  09/30/2010 FINDINGS: Lower chest: Linear bibasilar subsegmental atelectasis or scarring. Cardiomegaly with dense atheromatous calcifications of coronary arteries and aorta. Bilateral gynecomastia. Small hiatal hernia. No pleural effusion or pericardial effusion. Hepatobiliary: No focal liver abnormality is seen. No gallstones, gallbladder wall thickening, or biliary dilatation. Pancreas: Unremarkable. No pancreatic ductal dilatation or surrounding inflammatory changes. Spleen: Normal in size without focal abnormality. Adrenals/Urinary Tract: Adrenal glands are unremarkable. Kidneys are normal, without renal calculi, focal lesion, or hydronephrosis. Bladder is unremarkable. Stomach/Bowel: Stomach is within normal limits. Appendix not discretely visualized and there is no  suggestion of appendicitis. Normal. No evidence of bowel wall thickening, distention, or inflammatory changes. Vascular/Lymphatic: Dense atheromatous calcifications noted for the abdominal aorta and its branches. Left renal artery stent. Distal abdominal aortic aneurysm with an outer diameter 3 cm. Typically this finding can be followed after a 3 year interval. Reproductive: Prostate is unremarkable. Other: No abdominal wall hernia or abnormality. No abdominopelvic ascites. Musculoskeletal: Thoracolumbosacral degenerative changes. Grade 1 L5 retrolisthesis. Vacuum disc at L2-3 and L3-4. IMPRESSION:  1. No acute abdominal or pelvic pathology identified. 2. Cardiomegaly. 3. Small hiatal hernia. 4. Bilateral gynecomastia. Four distal abdominal aortic aneurysm, 3 cm. 5. Dense atheromatous changes of the aorta and its branches. 6. Thoracolumbosacral degenerative disc disease. Electronically Signed   By: Sammie Bench M.D.   On: 05/14/2022 20:41   CT HEAD WO CONTRAST  Result Date: 05/14/2022 CLINICAL DATA:  Altered mental status, syncopal episode EXAM: CT HEAD WITHOUT CONTRAST TECHNIQUE: Contiguous axial images were obtained from the base of the skull through the vertex without intravenous contrast. RADIATION DOSE REDUCTION: This exam was performed according to the departmental dose-optimization program which includes automated exposure control, adjustment of the mA and/or kV according to patient size and/or use of iterative reconstruction technique. COMPARISON:  07/22/2021 FINDINGS: Brain: No evidence of acute infarction, hemorrhage, hydrocephalus, extra-axial collection or mass lesion/mass effect. Periventricular and deep white matter hypodensity. Vascular: No hyperdense vessel or unexpected calcification. Skull: Normal. Negative for fracture or focal lesion. Sinuses/Orbits: No acute finding. Other: None. IMPRESSION: No acute intracranial pathology. Small-vessel white matter disease in keeping with advanced patient age. Electronically Signed   By: Delanna Ahmadi M.D.   On: 05/14/2022 14:27   DG Chest Port 1 View  Result Date: 05/14/2022 CLINICAL DATA:  Loss of consciousness. EXAM: PORTABLE CHEST 1 VIEW COMPARISON:  07/22/2021 FINDINGS: Stable cardiac enlargement. Mild scarring/atelectasis at both lung bases. There is no evidence of pulmonary edema, consolidation, pneumothorax or pleural fluid. Visualized bony structures are unremarkable. IMPRESSION: Stable cardiac enlargement. Mild scarring/atelectasis at both lung bases. Electronically Signed   By: Aletta Edouard M.D.   On: 05/14/2022 13:45         Scheduled Meds:  dextrose  25 g Intravenous STAT   ipratropium-albuterol  3 mL Inhalation TID   levothyroxine  25 mcg Oral Q0600   tranexamic acid  500 mg Topical Once   Continuous Infusions:  dextrose 75 mL/hr at 05/15/22 1029     LOS: 1 day    Time spent: 35 minutes     Barb Merino, MD [facility] Pager [phone]

## 2022-05-16 DIAGNOSIS — E162 Hypoglycemia, unspecified: Secondary | ICD-10-CM | POA: Diagnosis present

## 2022-05-16 DIAGNOSIS — D62 Acute posthemorrhagic anemia: Secondary | ICD-10-CM | POA: Diagnosis not present

## 2022-05-16 DIAGNOSIS — R55 Syncope and collapse: Secondary | ICD-10-CM | POA: Diagnosis not present

## 2022-05-16 LAB — CBC WITH DIFFERENTIAL/PLATELET
Abs Immature Granulocytes: 0.1 10*3/uL — ABNORMAL HIGH (ref 0.00–0.07)
Basophils Absolute: 0 10*3/uL (ref 0.0–0.1)
Basophils Relative: 0 %
Eosinophils Absolute: 1.5 10*3/uL — ABNORMAL HIGH (ref 0.0–0.5)
Eosinophils Relative: 12 %
HCT: 23.1 % — ABNORMAL LOW (ref 39.0–52.0)
Hemoglobin: 8.3 g/dL — ABNORMAL LOW (ref 13.0–17.0)
Immature Granulocytes: 1 %
Lymphocytes Relative: 9 %
Lymphs Abs: 1.1 10*3/uL (ref 0.7–4.0)
MCH: 32 pg (ref 26.0–34.0)
MCHC: 35.9 g/dL (ref 30.0–36.0)
MCV: 89.2 fL (ref 80.0–100.0)
Monocytes Absolute: 1 10*3/uL (ref 0.1–1.0)
Monocytes Relative: 8 %
Neutro Abs: 8.2 10*3/uL — ABNORMAL HIGH (ref 1.7–7.7)
Neutrophils Relative %: 70 %
Platelets: 225 10*3/uL (ref 150–400)
RBC: 2.59 MIL/uL — ABNORMAL LOW (ref 4.22–5.81)
RDW: 16.8 % — ABNORMAL HIGH (ref 11.5–15.5)
WBC: 11.9 10*3/uL — ABNORMAL HIGH (ref 4.0–10.5)
nRBC: 1.2 % — ABNORMAL HIGH (ref 0.0–0.2)

## 2022-05-16 LAB — BPAM RBC
Blood Product Expiration Date: 202402082359
Blood Product Expiration Date: 202402272359
Blood Product Expiration Date: 202402272359
ISSUE DATE / TIME: 202401311745
ISSUE DATE / TIME: 202401312135
ISSUE DATE / TIME: 202402010857
Unit Type and Rh: 7300
Unit Type and Rh: 7300
Unit Type and Rh: 7300

## 2022-05-16 LAB — TYPE AND SCREEN
ABO/RH(D): B POS
Antibody Screen: NEGATIVE
Unit division: 0
Unit division: 0
Unit division: 0

## 2022-05-16 LAB — GLUCOSE, CAPILLARY
Glucose-Capillary: 94 mg/dL (ref 70–99)
Glucose-Capillary: 97 mg/dL (ref 70–99)
Glucose-Capillary: 15 mg/dL — CL (ref 70–99)
Glucose-Capillary: 22 mg/dL — CL (ref 70–99)
Glucose-Capillary: 39 mg/dL — CL (ref 70–99)
Glucose-Capillary: 98 mg/dL (ref 70–99)
Glucose-Capillary: 156 mg/dL — ABNORMAL HIGH (ref 70–99)

## 2022-05-16 LAB — GLUCOSE, RANDOM: Glucose, Bld: 149 mg/dL — ABNORMAL HIGH (ref 70–99)

## 2022-05-16 MED ORDER — DEXTROSE 50 % IV SOLN: 25.0000 g | INTRAVENOUS | Status: DC

## 2022-05-16 NOTE — TOC Transition Note (Signed)
Transition of Care [facility]) - CM/SW Discharge Note   Patient Details  Name: Kevin Schwartz MRN: [id number] Date of Birth: 04/02/1931  [facility] CM/SW Contact:  Vinie Sill, LCSW Phone Number: 05/16/2022, 2:44 PM   Clinical Narrative:     Patient will Discharge to: [facility]  Discharge Date: 05/16/22 Family Notified: patient's son Transport By: Corey Harold  Per MD patient is ready for discharge. RN, patient, and facility notified of discharge. Discharge Summary sent to facility. RN given number for [phone]. Ambulance transport requested for patient.   Clinical Social Worker signing off.  Thurmond Butts, MSW, LCSW Clinical Social Worker     Final next level of care: Skilled Nursing Facility Barriers to Discharge: Barriers Resolved   Patient Goals and CMS Choice      Discharge Placement                Patient chooses bed at:  [facility]) Patient to be transferred to facility by: [place] Name of family member notified: Son Patient and family notified of of transfer: 05/16/22  Discharge Plan and Services Additional resources added to the After Visit Summary for                                       Social Determinants of Health (SDOH) Interventions SDOH Screenings   Food Insecurity: No Food Insecurity (05/25/2017)  Transportation Needs: No Transportation Needs (05/25/2017)  Alcohol Screen: Low Risk  (01/25/2018)  Depression (PHQ2-9): Low Risk  (06/06/2021)  Financial Resource Strain: Low Risk  (05/25/2017)  Physical Activity: Insufficiently Active (05/25/2017)  Social Connections: Somewhat Isolated (05/25/2017)  Stress: Stress Concern Present (05/25/2017)  Tobacco Use: High Risk (05/14/2022)     Readmission Risk Interventions     No data to display

## 2022-05-16 NOTE — Progress Notes (Signed)
Pt discharged to [facility] and pt picked up by [facility] to be transported off to facility. Pt transported off unit via stretcher with belongings to the side. Francis Gaines Kristi Norment RN.

## 2022-05-16 NOTE — Social Work (Signed)
CSW sent message to [facility]- waiting on response.   Thurmond Butts, MSW, LCSW Clinical Social Worker

## 2022-05-16 NOTE — Progress Notes (Signed)
Pt CBG was checked to be 39 but pt was alert, responsive and asymptomatic denying any symptoms. Pt was given 2 OJ drinks, 1 Glucerna drink and fed 1 cup of pudding. Pt CBG was rechecked to be 15 and CBG machine was changed and CBG was 22 but remained alert and asymptomatic. Dr. Sloan Leiter was notified who ordered for CBG to checked with glucose lab draw. Pt had his lunch meal and cbg checked to be 98 and initiated hypoglycemia protocol cancelled as per MD, CBG may be inaccurate reading d/t poor circulation. Lab CBG checked to be 156. Pt resting comfortably in bed with call light within reach. Will continue to closely monitor pt. Delia Heady RN

## 2022-05-16 NOTE — Progress Notes (Signed)
RN called [facility] attempting to report off on pt but unsuccessful x2. Receptionist transfer call but no answer x2. Pt discharge packet printed off and placed on pt's chart and pt IV and telemetry removed. Pt discharge to [facility] and waiting on [facility] to come transfer to disposition. Will continue to closely monitor pt till [facility] transport. Delia Heady RN

## 2022-05-16 NOTE — Discharge Summary (Addendum)
Physician Discharge Summary  Kevin Schwartz GQQ:[id number] DOB: 26-Apr-1930 DOA: 05/14/2022  PCP: [name], MD  Admit date: 05/14/2022 Discharge date: 05/16/2022  Admitted From: Skilled nursing facility Disposition: Skilled nursing facility  Recommendations for Outpatient Follow-up:  Follow up with PCP in 1-2 weeks Please obtain BMP/CBC in one week   Discharge Condition: Stable CODE STATUS: Full code Diet recommendation: Regular diet, nutritional supplements.  Discharge summary: 89 year old with history of coronary artery disease status post non-STEMI, peripheral artery disease, CKD, type 2 diabetes, hypertension hyperlipidemia and A-fib presented from SNF with lightheadedness and collapsed and reportedly unresponsive for about 3 minutes.  No injuries reported.  He had dental extraction last Thursday and has been bleeding from his mouth since then.  In the emergency room hemodynamically stable.  Blood pressures stable.  Hemoglobin was 6.7 with known hemoglobin baseline of about 9-10.  Patient was also noted to have low blood sugars on fingersticks however normal blood sugars on serum test. Admitted to hospital, received total 3 units of blood transfusions.  Gum bleeding stopped with conservative management and pressure.     Assessment & plan of care:   Syncope secondary to acute blood loss anemia: Patient on Eliquis and aspirin which he has been holding for last 10 days, was held 4 days prior to dental extraction.  -Nebulized tranexamic acid,did not respond.  -Hemoglobin 6.9-given total 3 units of PRBC with appropriate response.  Hemoglobin 8.3 today.  No further bleeding. -Right upper gum bleeding is stopped after pressure treatment. No orthostatic symptoms today.  Spurious hypoglycemia:  Patient did present with poor oral intake.  However, his peripheral stick blood sugars will read a very low despite not having any hypoglycemic symptoms.  Patient was treated with  dextrose infusions.   Multiple verifications of blood sugars  2/1 9AM fingerstick 52-serum glucose 132 2/2 fingerstick 22-serum glucose 156 when checked from blood draw .  Patient with no evidence of hypoglycemia on BMP since admission. He is at risk of hypoglycemia if oral intake does not remain adequate. Advise regular diet.  Advised to avoid any hypoglycemic agents.   CKD stage IV: At about baseline.  Continue monitor.   Chronic combined heart failure: Currently euvolemic.  Resume diuretics and Jardiance.   Hypothyroidism: On Synthroid.   Coronary artery disease holding aspirin and Eliquis.  Continue to hold aspirin and Eliquis until all dental procedures are completed.  Will defer to dental surgery on outpatient follow-up to resume medications.   Acute abdominal pain: Currently resolved.  CT scan abdomen pelvis without any evidence of acute findings including bleeding.   Stable for discharge back to skilled nursing facility to continue rehab before going home.   Discharge Diagnoses:  Principal Problem:   Syncope Active Problems:   Hypoglycemia, unspecified    Discharge Instructions  Discharge Instructions     Diet general   Complete by: As directed    Discharge instructions   Complete by: As directed    Continue to hold Eliquis and aspirin until cleared by dental surgery.   Increase activity slowly   Complete by: As directed       Allergies as of 05/16/2022       Reactions   Cozaar [losartan Potassium] Swelling, Other (See Comments)   Angioedema   Zestril [lisinopril] Swelling, Other (See Comments)   Angioedema   Crestor [rosuvastatin] Swelling, Other (See Comments)   Angioedema   Tape Other (See Comments)   Sensitive skin, tears easily.  Medication List     STOP taking these medications    apixaban 2.5 MG Tabs tablet Commonly known as: ELIQUIS   aspirin EC 81 MG tablet   azithromycin 250 MG tablet Commonly known as: ZITHROMAX   lidocaine  5 % Commonly known as: Lidoderm   nitroGLYCERIN 0.4 MG SL tablet Commonly known as: NITROSTAT   oseltamivir 75 MG capsule Commonly known as: TAMIFLU       TAKE these medications    allopurinol 100 MG tablet Commonly known as: ZYLOPRIM Take 1 tablet (100 mg total) by mouth daily.   atorvastatin 80 MG tablet Commonly known as: LIPITOR Take 1 tablet (80 mg total) by mouth daily. What changed: when to take this   Biofreeze 4 % Gel Generic drug: Menthol (Topical Analgesic) Apply 1 application  topically in the morning and at bedtime. To right wrist/hand   Eucerin Itch Relief 0.1 % Lotn Generic drug: Menthol (Topical Analgesic) Apply 1 application  topically daily. To rash on chest/abdomin/back/neck/shoulder   cetirizine 10 MG tablet Commonly known as: ZYRTEC Take 10 mg by mouth daily.   empagliflozin 10 MG Tabs tablet Commonly known as: Jardiance Take 1 tablet (10 mg total) by mouth daily. Patient takes 1 tablet by mouth every other day. What changed: additional instructions   eucerin cream Apply 1 Application topically daily.   ezetimibe 10 MG tablet Commonly known as: ZETIA Take 10 mg by mouth daily.   levothyroxine 25 MCG tablet Commonly known as: SYNTHROID Take 25 mcg by mouth daily before breakfast.   multivitamin tablet Take 1 tablet by mouth daily.   Petrolatum Hydrophilic Oint Apply 1 application  topically at bedtime. To bilateral nares   sodium chloride 0.65 % Soln nasal spray Commonly known as: OCEAN Place 1 spray into both nostrils daily.   tamsulosin 0.4 MG Caps capsule Commonly known as: FLOMAX Take 0.4 mg by mouth daily.   torsemide 20 MG tablet Commonly known as: DEMADEX Take 3 tablets (60 mg total) by mouth daily. What changed:  how much to take when to take this additional instructions   VASELINE EX Apply 1 application  topically at bedtime. To bilateral nares   Zinc Oxide 25 % Pste Apply 1 application  topically 2 (two) times  daily. To bottom during incontinence care         Allergies  Allergen Reactions   Cozaar [Losartan Potassium] Swelling and Other (See Comments)    Angioedema    Zestril [Lisinopril] Swelling and Other (See Comments)    Angioedema    Crestor [Rosuvastatin] Swelling and Other (See Comments)    Angioedema    Tape Other (See Comments)    Sensitive skin, tears easily.    Consultations: Oral surgery, phone consultation   Procedures/Studies: CT ABDOMEN PELVIS WO CONTRAST  Result Date: 05/14/2022 CLINICAL DATA:  Abdominal pain EXAM: CT ABDOMEN AND PELVIS WITHOUT CONTRAST TECHNIQUE: Multidetector CT imaging of the abdomen and pelvis was performed following the standard protocol without IV contrast. RADIATION DOSE REDUCTION: This exam was performed according to the departmental dose-optimization program which includes automated exposure control, adjustment of the mA and/or kV according to patient size and/or use of iterative reconstruction technique. COMPARISON:  09/30/2010 FINDINGS: Lower chest: Linear bibasilar subsegmental atelectasis or scarring. Cardiomegaly with dense atheromatous calcifications of coronary arteries and aorta. Bilateral gynecomastia. Small hiatal hernia. No pleural effusion or pericardial effusion. Hepatobiliary: No focal liver abnormality is seen. No gallstones, gallbladder wall thickening, or biliary dilatation. Pancreas: Unremarkable. No pancreatic ductal dilatation  or surrounding inflammatory changes. Spleen: Normal in size without focal abnormality. Adrenals/Urinary Tract: Adrenal glands are unremarkable. Kidneys are normal, without renal calculi, focal lesion, or hydronephrosis. Bladder is unremarkable. Stomach/Bowel: Stomach is within normal limits. Appendix not discretely visualized and there is no suggestion of appendicitis. Normal. No evidence of bowel wall thickening, distention, or inflammatory changes. Vascular/Lymphatic: Dense atheromatous calcifications noted  for the abdominal aorta and its branches. Left renal artery stent. Distal abdominal aortic aneurysm with an outer diameter 3 cm. Typically this finding can be followed after a 3 year interval. Reproductive: Prostate is unremarkable. Other: No abdominal wall hernia or abnormality. No abdominopelvic ascites. Musculoskeletal: Thoracolumbosacral degenerative changes. Grade 1 L5 retrolisthesis. Vacuum disc at L2-3 and L3-4. IMPRESSION: 1. No acute abdominal or pelvic pathology identified. 2. Cardiomegaly. 3. Small hiatal hernia. 4. Bilateral gynecomastia. Four distal abdominal aortic aneurysm, 3 cm. 5. Dense atheromatous changes of the aorta and its branches. 6. Thoracolumbosacral degenerative disc disease. Electronically Signed   By: Sammie Bench M.D.   On: 05/14/2022 20:41   CT HEAD WO CONTRAST  Result Date: 05/14/2022 CLINICAL DATA:  Altered mental status, syncopal episode EXAM: CT HEAD WITHOUT CONTRAST TECHNIQUE: Contiguous axial images were obtained from the base of the skull through the vertex without intravenous contrast. RADIATION DOSE REDUCTION: This exam was performed according to the departmental dose-optimization program which includes automated exposure control, adjustment of the mA and/or kV according to patient size and/or use of iterative reconstruction technique. COMPARISON:  07/22/2021 FINDINGS: Brain: No evidence of acute infarction, hemorrhage, hydrocephalus, extra-axial collection or mass lesion/mass effect. Periventricular and deep white matter hypodensity. Vascular: No hyperdense vessel or unexpected calcification. Skull: Normal. Negative for fracture or focal lesion. Sinuses/Orbits: No acute finding. Other: None. IMPRESSION: No acute intracranial pathology. Small-vessel white matter disease in keeping with advanced patient age. Electronically Signed   By: Delanna Ahmadi M.D.   On: 05/14/2022 14:27   DG Chest Port 1 View  Result Date: 05/14/2022 CLINICAL DATA:  Loss of consciousness.  EXAM: PORTABLE CHEST 1 VIEW COMPARISON:  07/22/2021 FINDINGS: Stable cardiac enlargement. Mild scarring/atelectasis at both lung bases. There is no evidence of pulmonary edema, consolidation, pneumothorax or pleural fluid. Visualized bony structures are unremarkable. IMPRESSION: Stable cardiac enlargement. Mild scarring/atelectasis at both lung bases. Electronically Signed   By: Aletta Edouard M.D.   On: 05/14/2022 13:45   (Echo, Carotid, EGD, Colonoscopy, ERCP)    Subjective: Patient seen in the morning rounds.  Denies any complaints.  No more bleeding since yesterday afternoon.  Eating regular diet.  He is eager to go back to [facility]. Patient was eating regular diet in the morning, blood sugars through peripheral stick are reported to be 22-15-90.  Patient denies any symptoms.   Discharge Exam: Vitals:   05/16/22 0903 05/16/22 1139  BP: 110/67 (!) 131/98  Pulse: 79 70  Resp: 14 16  Temp: 98.4 F (36.9 C) 97.7 F (36.5 C)  SpO2: 100% 100%   Vitals:   05/16/22 0000 05/16/22 0345 05/16/22 0903 05/16/22 1139  BP: (!) 117/55 119/61 110/67 (!) 131/98  Pulse: 96 85 79 70  Resp: '15 16 14 16  '$ Temp: 97.8 F (36.6 C) 98.3 F (36.8 C) 98.4 F (36.9 C) 97.7 F (36.5 C)  TempSrc: Oral Oral Oral Oral  SpO2: 100% 100% 100% 100%  Weight:      Height:        General: Pt is alert, awake, not in acute distress Alert awake and oriented.  He  is on 2 L of oxygen. Oral cavity is clean.  No evidence of bleeding.  He does have some raw area on the right upper gumline. Cardiovascular: RRR, S1/S2 +, no rubs, no gallops Respiratory: CTA bilaterally, no wheezing, no rhonchi Abdominal: Soft, NT, ND, bowel sounds +, obese and pendulous. Extremities: no edema, no cyanosis    The results of significant diagnostics from this hospitalization (including imaging, microbiology, ancillary and laboratory) are listed below for reference.     Microbiology: No results found for this or any previous  visit (from the past 240 hour(s)).   Labs: BNP (last 3 results) Recent Labs    08/26/21 1410 09/12/21 1115 01/14/22 1148  BNP 543.3* 274.5* 174.0*   Basic Metabolic Panel: Recent Labs  Lab 05/14/22 1355 05/14/22 1848 05/15/22 0309 05/15/22 0927 05/15/22 1842  NA 136 137 138  --   --   K 4.8 4.6 4.0  --   --   CL 100 101 104  --   --   CO2 23 20* 23  --   --   GLUCOSE 116* 180* 136* 132* 118*  BUN 80* 83* 81*  --   --   CREATININE 2.67* 2.74* 2.39*  --   --   CALCIUM 8.6* 8.6* 8.2*  --   --    Liver Function Tests: Recent Labs  Lab 05/14/22 1355 05/14/22 1848 05/15/22 0309  AST 39 34 26  ALT '21 17 16  '$ ALKPHOS 61 57 50  BILITOT 0.7 0.5 0.5  PROT 7.6 6.9 6.4*  ALBUMIN 3.8 3.6 3.2*   No results for input(s): "LIPASE", "AMYLASE" in the last 168 hours. No results for input(s): "AMMONIA" in the last 168 hours. CBC: Recent Labs  Lab 05/14/22 1355 05/14/22 1848 05/15/22 0309 05/15/22 0927 05/15/22 1842 05/16/22 0557  WBC 10.0 9.5 11.2* 14.1* 12.7* 11.9*  NEUTROABS 8.5*  --   --   --  9.1* 8.2*  HGB 6.9* 6.7* 8.4* 8.5* 8.7* 8.3*  HCT 21.9* 22.8* 24.2* 25.4* 26.0* 23.1*  MCV 100.5* 106.5* 91.0 94.4 91.9 89.2  PLT 283 302 245 251 221 225   Cardiac Enzymes: No results for input(s): "CKTOTAL", "CKMB", "CKMBINDEX", "TROPONINI" in the last 168 hours. BNP: Invalid input(s): "POCBNP" CBG: Recent Labs  Lab 05/16/22 1147 05/16/22 1204 05/16/22 1209 05/16/22 1246 05/16/22 1349  GLUCAP 39* 15* 22* 98 156*   D-Dimer No results for input(s): "DDIMER" in the last 72 hours. Hgb A1c Recent Labs    05/14/22 1848  HGBA1C 5.6   Lipid Profile No results for input(s): "CHOL", "HDL", "LDLCALC", "TRIG", "CHOLHDL", "LDLDIRECT" in the last 72 hours. Thyroid function studies No results for input(s): "TSH", "T4TOTAL", "T3FREE", "THYROIDAB" in the last 72 hours.  Invalid input(s): "FREET3" Anemia work up No results for input(s): "VITAMINB12", "FOLATE", "FERRITIN",  "TIBC", "IRON", "RETICCTPCT" in the last 72 hours. Urinalysis    Component Value Date/Time   COLORURINE STRAW (A) 05/14/2022 2045   APPEARANCEUR CLEAR 05/14/2022 2045   LABSPEC 1.009 05/14/2022 2045   PHURINE 5.0 05/14/2022 2045   GLUCOSEU 50 (A) 05/14/2022 2045   HGBUR NEGATIVE 05/14/2022 2045   BILIRUBINUR NEGATIVE 05/14/2022 2045   KETONESUR NEGATIVE 05/14/2022 2045   PROTEINUR NEGATIVE 05/14/2022 2045   NITRITE NEGATIVE 05/14/2022 2045   LEUKOCYTESUR NEGATIVE 05/14/2022 2045   Sepsis Labs Recent Labs  Lab 05/15/22 0309 05/15/22 0927 05/15/22 1842 05/16/22 0557  WBC 11.2* 14.1* 12.7* 11.9*   Microbiology No results found for this or any previous visit (from  the past 240 hour(s)).   Time coordinating discharge:  32 minutes  SIGNED:   Barb Merino, MD  [facility] 05/16/2022, 1:58 PM

## 2022-05-16 NOTE — TOC Initial Note (Signed)
Transition of Care [facility]) - Initial/Assessment Note    Patient Details  Name: Kevin Schwartz MRN: [id number] Date of Birth: 24-Mar-1931  Transition of Care [facility]) CM/SW Contact:    Vinie Sill, LCSW Phone Number: 05/16/2022, 12:00 PM  Clinical Narrative:                  CSW spoke with the patient's son,Doug. CSW introduces self and explained role. He confirmed patient is form Publishing copy and is expected to return.    [place] confirmed patient can return today. CSW updated MD & team.  Thurmond Butts, MSW, LCSW Clinical Social Worker    Expected Discharge Plan: Skilled Nursing Facility Barriers to Discharge: Barriers Resolved   Patient Goals and CMS Choice            Expected Discharge Plan and Services       Living arrangements for the past 2 months: [place] Expected Discharge Date: 05/16/22                                    Prior Living Arrangements/Services Living arrangements for the past 2 months: [place]                     Activities of Daily Living      Permission Sought/Granted                  Emotional Assessment       Orientation: : Oriented to Self, Oriented to Place, Oriented to  Time, Oriented to Situation Alcohol / Substance Use: Not Applicable Psych Involvement: No (comment)  Admission diagnosis:  Syncope and collapse [R55] Bleeding [R58] Syncope [R55] Hypoglycemia [E16.2] Patient Active Problem List   Diagnosis Date Noted   Syncope 05/14/2022   Chronic diastolic heart failure ([place]) 08/26/2021   Acute on chronic combined systolic and diastolic CHF (congestive heart failure) ([place]) 07/11/2021   Acute respiratory failure with hypoxia ([place]) 07/11/2021   Leg wound, left 07/11/2021   Atrial flutter ([place]) 07/11/2021   Acute CHF (congestive heart failure) ([place]) 04/16/2021   PNA (pneumonia) 04/16/2021   COVID-19    Elevated troponin    NSTEMI (non-ST elevated  myocardial infarction) ([place]) 07/31/2020   Grieving 02/16/2020   Bilateral inguinal hernia s/p lap hernia repair w mesh 10/30/2017 10/30/2017   Left femoral hernia s/p lap hernia repair w mesh 10/30/2017 10/30/2017   Loss of weight 01/21/2016   Heme positive stool 01/21/2016   Heart murmur 08/18/2013   Chronic kidney disease, stage III (moderate) ([place]) 07/14/2013   Hyperlipidemia LDL goal < 100 07/14/2013   Essential hypertension, benign 07/14/2013   Type II or unspecified type diabetes mellitus with renal manifestations, not stated as uncontrolled(250.40) 07/14/2013   Insomnia 07/14/2013   Angioedema of lips 12/26/2012   MGUS (monoclonal gammopathy of unknown significance) 08/01/2011   Gout of right wrist 08/01/2011   S/P coronary artery stent placement 08/01/2011   DM type 2 causing CKD stage 2 ([place]) 08/01/2011   Bradycardia 10/15/2010   Mixed hyperlipidemia due to type 2 diabetes mellitus ([place]) 05/01/2010   Essential hypertension 05/01/2010   CAD, NATIVE VESSEL 05/01/2010   PVD 05/01/2010   PCP:  [name], MD Pharmacy:   [id number] [address]Phone: [phone]: [phone] [facility]  [place], [place] [facility] [address] [place] [address] Phone: [phone] Fax: [phone]     Social Determinants of Health (SDOH) Social History: SDOH Screenings   Food Insecurity: No Food Insecurity (05/25/2017)  Transportation Needs: No Transportation Needs (05/25/2017)  Alcohol Screen: Low Risk  (01/25/2018)  Depression (PHQ2-9): Low Risk  (06/06/2021)  Financial Resource Strain: Low Risk  (05/25/2017)  Physical Activity: Insufficiently Active (05/25/2017)  Social Connections: Somewhat Isolated (05/25/2017)  Stress: Stress Concern Present (05/25/2017)  Tobacco Use: High Risk (05/14/2022)   SDOH Interventions:      Readmission Risk Interventions     No data to display

## 2022-05-19 DIAGNOSIS — Z9181 History of falling: Secondary | ICD-10-CM | POA: Diagnosis not present

## 2022-05-19 DIAGNOSIS — R2689 Other abnormalities of gait and mobility: Secondary | ICD-10-CM | POA: Diagnosis not present

## 2022-05-19 DIAGNOSIS — R5381 Other malaise: Secondary | ICD-10-CM | POA: Diagnosis not present

## 2022-05-19 DIAGNOSIS — R52 Pain, unspecified: Secondary | ICD-10-CM | POA: Diagnosis not present

## 2022-05-19 DIAGNOSIS — J9601 Acute respiratory failure with hypoxia: Secondary | ICD-10-CM | POA: Diagnosis not present

## 2022-05-19 DIAGNOSIS — M6250 Muscle wasting and atrophy, not elsewhere classified, unspecified site: Secondary | ICD-10-CM | POA: Diagnosis not present

## 2022-05-19 DIAGNOSIS — I4891 Unspecified atrial fibrillation: Secondary | ICD-10-CM | POA: Diagnosis not present

## 2022-05-19 DIAGNOSIS — R1312 Dysphagia, oropharyngeal phase: Secondary | ICD-10-CM | POA: Diagnosis not present

## 2022-05-19 DIAGNOSIS — D62 Acute posthemorrhagic anemia: Secondary | ICD-10-CM | POA: Diagnosis not present

## 2022-05-19 DIAGNOSIS — R1311 Dysphagia, oral phase: Secondary | ICD-10-CM | POA: Diagnosis not present

## 2022-05-19 DIAGNOSIS — I119 Hypertensive heart disease without heart failure: Secondary | ICD-10-CM | POA: Diagnosis not present

## 2022-05-19 DIAGNOSIS — M6281 Muscle weakness (generalized): Secondary | ICD-10-CM | POA: Diagnosis not present

## 2022-05-20 DIAGNOSIS — Z7901 Long term (current) use of anticoagulants: Secondary | ICD-10-CM | POA: Diagnosis not present

## 2022-05-20 DIAGNOSIS — J9601 Acute respiratory failure with hypoxia: Secondary | ICD-10-CM | POA: Diagnosis not present

## 2022-05-20 DIAGNOSIS — E0590 Thyrotoxicosis, unspecified without thyrotoxic crisis or storm: Secondary | ICD-10-CM | POA: Diagnosis not present

## 2022-05-20 DIAGNOSIS — E7800 Pure hypercholesterolemia, unspecified: Secondary | ICD-10-CM | POA: Diagnosis not present

## 2022-05-20 DIAGNOSIS — E785 Hyperlipidemia, unspecified: Secondary | ICD-10-CM | POA: Diagnosis not present

## 2022-05-20 DIAGNOSIS — R1311 Dysphagia, oral phase: Secondary | ICD-10-CM | POA: Diagnosis not present

## 2022-05-20 DIAGNOSIS — N184 Chronic kidney disease, stage 4 (severe): Secondary | ICD-10-CM | POA: Diagnosis not present

## 2022-05-20 DIAGNOSIS — I5042 Chronic combined systolic (congestive) and diastolic (congestive) heart failure: Secondary | ICD-10-CM | POA: Diagnosis not present

## 2022-05-20 DIAGNOSIS — I130 Hypertensive heart and chronic kidney disease with heart failure and stage 1 through stage 4 chronic kidney disease, or unspecified chronic kidney disease: Secondary | ICD-10-CM | POA: Diagnosis not present

## 2022-05-20 DIAGNOSIS — R1312 Dysphagia, oropharyngeal phase: Secondary | ICD-10-CM | POA: Diagnosis not present

## 2022-05-20 DIAGNOSIS — R2689 Other abnormalities of gait and mobility: Secondary | ICD-10-CM | POA: Diagnosis not present

## 2022-05-20 DIAGNOSIS — E1122 Type 2 diabetes mellitus with diabetic chronic kidney disease: Secondary | ICD-10-CM | POA: Diagnosis not present

## 2022-05-20 DIAGNOSIS — I4892 Unspecified atrial flutter: Secondary | ICD-10-CM | POA: Diagnosis not present

## 2022-05-20 DIAGNOSIS — E039 Hypothyroidism, unspecified: Secondary | ICD-10-CM | POA: Diagnosis not present

## 2022-05-20 DIAGNOSIS — D689 Coagulation defect, unspecified: Secondary | ICD-10-CM | POA: Diagnosis not present

## 2022-05-20 DIAGNOSIS — E119 Type 2 diabetes mellitus without complications: Secondary | ICD-10-CM | POA: Diagnosis not present

## 2022-05-20 DIAGNOSIS — D62 Acute posthemorrhagic anemia: Secondary | ICD-10-CM | POA: Diagnosis not present

## 2022-05-20 DIAGNOSIS — M6281 Muscle weakness (generalized): Secondary | ICD-10-CM | POA: Diagnosis not present

## 2022-05-21 DIAGNOSIS — M6281 Muscle weakness (generalized): Secondary | ICD-10-CM | POA: Diagnosis not present

## 2022-05-21 DIAGNOSIS — R2689 Other abnormalities of gait and mobility: Secondary | ICD-10-CM | POA: Diagnosis not present

## 2022-05-21 DIAGNOSIS — R1312 Dysphagia, oropharyngeal phase: Secondary | ICD-10-CM | POA: Diagnosis not present

## 2022-05-21 DIAGNOSIS — J9601 Acute respiratory failure with hypoxia: Secondary | ICD-10-CM | POA: Diagnosis not present

## 2022-05-21 DIAGNOSIS — R1311 Dysphagia, oral phase: Secondary | ICD-10-CM | POA: Diagnosis not present

## 2022-05-22 DIAGNOSIS — R1312 Dysphagia, oropharyngeal phase: Secondary | ICD-10-CM | POA: Diagnosis not present

## 2022-05-22 DIAGNOSIS — R1311 Dysphagia, oral phase: Secondary | ICD-10-CM | POA: Diagnosis not present

## 2022-05-22 DIAGNOSIS — M6281 Muscle weakness (generalized): Secondary | ICD-10-CM | POA: Diagnosis not present

## 2022-05-22 DIAGNOSIS — J9601 Acute respiratory failure with hypoxia: Secondary | ICD-10-CM | POA: Diagnosis not present

## 2022-05-22 DIAGNOSIS — R2689 Other abnormalities of gait and mobility: Secondary | ICD-10-CM | POA: Diagnosis not present

## 2022-05-23 DIAGNOSIS — M6281 Muscle weakness (generalized): Secondary | ICD-10-CM | POA: Diagnosis not present

## 2022-05-23 DIAGNOSIS — R2689 Other abnormalities of gait and mobility: Secondary | ICD-10-CM | POA: Diagnosis not present

## 2022-05-23 DIAGNOSIS — R1312 Dysphagia, oropharyngeal phase: Secondary | ICD-10-CM | POA: Diagnosis not present

## 2022-05-23 DIAGNOSIS — R1311 Dysphagia, oral phase: Secondary | ICD-10-CM | POA: Diagnosis not present

## 2022-05-23 DIAGNOSIS — J9601 Acute respiratory failure with hypoxia: Secondary | ICD-10-CM | POA: Diagnosis not present

## 2022-05-26 DIAGNOSIS — R1311 Dysphagia, oral phase: Secondary | ICD-10-CM | POA: Diagnosis not present

## 2022-05-26 DIAGNOSIS — R1312 Dysphagia, oropharyngeal phase: Secondary | ICD-10-CM | POA: Diagnosis not present

## 2022-05-26 DIAGNOSIS — R2689 Other abnormalities of gait and mobility: Secondary | ICD-10-CM | POA: Diagnosis not present

## 2022-05-26 DIAGNOSIS — J9601 Acute respiratory failure with hypoxia: Secondary | ICD-10-CM | POA: Diagnosis not present

## 2022-05-26 DIAGNOSIS — M6281 Muscle weakness (generalized): Secondary | ICD-10-CM | POA: Diagnosis not present

## 2022-05-27 DIAGNOSIS — R1311 Dysphagia, oral phase: Secondary | ICD-10-CM | POA: Diagnosis not present

## 2022-05-27 DIAGNOSIS — J9601 Acute respiratory failure with hypoxia: Secondary | ICD-10-CM | POA: Diagnosis not present

## 2022-05-27 DIAGNOSIS — R1312 Dysphagia, oropharyngeal phase: Secondary | ICD-10-CM | POA: Diagnosis not present

## 2022-05-27 DIAGNOSIS — R2689 Other abnormalities of gait and mobility: Secondary | ICD-10-CM | POA: Diagnosis not present

## 2022-05-27 DIAGNOSIS — M6281 Muscle weakness (generalized): Secondary | ICD-10-CM | POA: Diagnosis not present

## 2022-05-28 DIAGNOSIS — R1311 Dysphagia, oral phase: Secondary | ICD-10-CM | POA: Diagnosis not present

## 2022-05-28 DIAGNOSIS — J9601 Acute respiratory failure with hypoxia: Secondary | ICD-10-CM | POA: Diagnosis not present

## 2022-05-28 DIAGNOSIS — R2689 Other abnormalities of gait and mobility: Secondary | ICD-10-CM | POA: Diagnosis not present

## 2022-05-28 DIAGNOSIS — M6281 Muscle weakness (generalized): Secondary | ICD-10-CM | POA: Diagnosis not present

## 2022-05-28 DIAGNOSIS — R1312 Dysphagia, oropharyngeal phase: Secondary | ICD-10-CM | POA: Diagnosis not present

## 2022-05-30 DIAGNOSIS — M6281 Muscle weakness (generalized): Secondary | ICD-10-CM | POA: Diagnosis not present

## 2022-05-30 DIAGNOSIS — J9601 Acute respiratory failure with hypoxia: Secondary | ICD-10-CM | POA: Diagnosis not present

## 2022-05-30 DIAGNOSIS — R2689 Other abnormalities of gait and mobility: Secondary | ICD-10-CM | POA: Diagnosis not present

## 2022-05-30 DIAGNOSIS — R1311 Dysphagia, oral phase: Secondary | ICD-10-CM | POA: Diagnosis not present

## 2022-05-30 DIAGNOSIS — R1312 Dysphagia, oropharyngeal phase: Secondary | ICD-10-CM | POA: Diagnosis not present

## 2022-06-02 DIAGNOSIS — R1312 Dysphagia, oropharyngeal phase: Secondary | ICD-10-CM | POA: Diagnosis not present

## 2022-06-02 DIAGNOSIS — M6281 Muscle weakness (generalized): Secondary | ICD-10-CM | POA: Diagnosis not present

## 2022-06-02 DIAGNOSIS — R1311 Dysphagia, oral phase: Secondary | ICD-10-CM | POA: Diagnosis not present

## 2022-06-02 DIAGNOSIS — J9601 Acute respiratory failure with hypoxia: Secondary | ICD-10-CM | POA: Diagnosis not present

## 2022-06-02 DIAGNOSIS — R2689 Other abnormalities of gait and mobility: Secondary | ICD-10-CM | POA: Diagnosis not present

## 2022-06-03 DIAGNOSIS — R1312 Dysphagia, oropharyngeal phase: Secondary | ICD-10-CM | POA: Diagnosis not present

## 2022-06-03 DIAGNOSIS — M6281 Muscle weakness (generalized): Secondary | ICD-10-CM | POA: Diagnosis not present

## 2022-06-03 DIAGNOSIS — J9601 Acute respiratory failure with hypoxia: Secondary | ICD-10-CM | POA: Diagnosis not present

## 2022-06-03 DIAGNOSIS — R1311 Dysphagia, oral phase: Secondary | ICD-10-CM | POA: Diagnosis not present

## 2022-06-03 DIAGNOSIS — R2689 Other abnormalities of gait and mobility: Secondary | ICD-10-CM | POA: Diagnosis not present

## 2022-06-04 DIAGNOSIS — J9601 Acute respiratory failure with hypoxia: Secondary | ICD-10-CM | POA: Diagnosis not present

## 2022-06-04 DIAGNOSIS — R2689 Other abnormalities of gait and mobility: Secondary | ICD-10-CM | POA: Diagnosis not present

## 2022-06-04 DIAGNOSIS — R1312 Dysphagia, oropharyngeal phase: Secondary | ICD-10-CM | POA: Diagnosis not present

## 2022-06-04 DIAGNOSIS — R1311 Dysphagia, oral phase: Secondary | ICD-10-CM | POA: Diagnosis not present

## 2022-06-04 DIAGNOSIS — M6281 Muscle weakness (generalized): Secondary | ICD-10-CM | POA: Diagnosis not present

## 2022-06-05 DIAGNOSIS — R1311 Dysphagia, oral phase: Secondary | ICD-10-CM | POA: Diagnosis not present

## 2022-06-05 DIAGNOSIS — R1312 Dysphagia, oropharyngeal phase: Secondary | ICD-10-CM | POA: Diagnosis not present

## 2022-06-05 DIAGNOSIS — M6281 Muscle weakness (generalized): Secondary | ICD-10-CM | POA: Diagnosis not present

## 2022-06-05 DIAGNOSIS — J9601 Acute respiratory failure with hypoxia: Secondary | ICD-10-CM | POA: Diagnosis not present

## 2022-06-05 DIAGNOSIS — R2689 Other abnormalities of gait and mobility: Secondary | ICD-10-CM | POA: Diagnosis not present

## 2022-06-06 DIAGNOSIS — R1311 Dysphagia, oral phase: Secondary | ICD-10-CM | POA: Diagnosis not present

## 2022-06-06 DIAGNOSIS — R2689 Other abnormalities of gait and mobility: Secondary | ICD-10-CM | POA: Diagnosis not present

## 2022-06-06 DIAGNOSIS — M6281 Muscle weakness (generalized): Secondary | ICD-10-CM | POA: Diagnosis not present

## 2022-06-06 DIAGNOSIS — J9601 Acute respiratory failure with hypoxia: Secondary | ICD-10-CM | POA: Diagnosis not present

## 2022-06-06 DIAGNOSIS — R1312 Dysphagia, oropharyngeal phase: Secondary | ICD-10-CM | POA: Diagnosis not present

## 2022-06-09 DIAGNOSIS — R2689 Other abnormalities of gait and mobility: Secondary | ICD-10-CM | POA: Diagnosis not present

## 2022-06-09 DIAGNOSIS — R1311 Dysphagia, oral phase: Secondary | ICD-10-CM | POA: Diagnosis not present

## 2022-06-09 DIAGNOSIS — J9601 Acute respiratory failure with hypoxia: Secondary | ICD-10-CM | POA: Diagnosis not present

## 2022-06-09 DIAGNOSIS — R1312 Dysphagia, oropharyngeal phase: Secondary | ICD-10-CM | POA: Diagnosis not present

## 2022-06-09 DIAGNOSIS — M6281 Muscle weakness (generalized): Secondary | ICD-10-CM | POA: Diagnosis not present

## 2022-06-10 DIAGNOSIS — R1311 Dysphagia, oral phase: Secondary | ICD-10-CM | POA: Diagnosis not present

## 2022-06-10 DIAGNOSIS — R2689 Other abnormalities of gait and mobility: Secondary | ICD-10-CM | POA: Diagnosis not present

## 2022-06-10 DIAGNOSIS — E559 Vitamin D deficiency, unspecified: Secondary | ICD-10-CM | POA: Diagnosis not present

## 2022-06-10 DIAGNOSIS — M6281 Muscle weakness (generalized): Secondary | ICD-10-CM | POA: Diagnosis not present

## 2022-06-10 DIAGNOSIS — E119 Type 2 diabetes mellitus without complications: Secondary | ICD-10-CM | POA: Diagnosis not present

## 2022-06-10 DIAGNOSIS — J9601 Acute respiratory failure with hypoxia: Secondary | ICD-10-CM | POA: Diagnosis not present

## 2022-06-10 DIAGNOSIS — R1312 Dysphagia, oropharyngeal phase: Secondary | ICD-10-CM | POA: Diagnosis not present

## 2022-06-11 DIAGNOSIS — I4892 Unspecified atrial flutter: Secondary | ICD-10-CM | POA: Diagnosis not present

## 2022-06-11 DIAGNOSIS — E1122 Type 2 diabetes mellitus with diabetic chronic kidney disease: Secondary | ICD-10-CM | POA: Diagnosis not present

## 2022-06-11 DIAGNOSIS — M6281 Muscle weakness (generalized): Secondary | ICD-10-CM | POA: Diagnosis not present

## 2022-06-11 DIAGNOSIS — K08409 Partial loss of teeth, unspecified cause, unspecified class: Secondary | ICD-10-CM | POA: Diagnosis not present

## 2022-06-11 DIAGNOSIS — D62 Acute posthemorrhagic anemia: Secondary | ICD-10-CM | POA: Diagnosis not present

## 2022-06-11 DIAGNOSIS — R1311 Dysphagia, oral phase: Secondary | ICD-10-CM | POA: Diagnosis not present

## 2022-06-11 DIAGNOSIS — J9601 Acute respiratory failure with hypoxia: Secondary | ICD-10-CM | POA: Diagnosis not present

## 2022-06-11 DIAGNOSIS — Z7189 Other specified counseling: Secondary | ICD-10-CM | POA: Diagnosis not present

## 2022-06-11 DIAGNOSIS — I129 Hypertensive chronic kidney disease with stage 1 through stage 4 chronic kidney disease, or unspecified chronic kidney disease: Secondary | ICD-10-CM | POA: Diagnosis not present

## 2022-06-11 DIAGNOSIS — R2689 Other abnormalities of gait and mobility: Secondary | ICD-10-CM | POA: Diagnosis not present

## 2022-06-11 DIAGNOSIS — Z7984 Long term (current) use of oral hypoglycemic drugs: Secondary | ICD-10-CM | POA: Diagnosis not present

## 2022-06-11 DIAGNOSIS — N184 Chronic kidney disease, stage 4 (severe): Secondary | ICD-10-CM | POA: Diagnosis not present

## 2022-06-11 DIAGNOSIS — E039 Hypothyroidism, unspecified: Secondary | ICD-10-CM | POA: Diagnosis not present

## 2022-06-11 DIAGNOSIS — R1312 Dysphagia, oropharyngeal phase: Secondary | ICD-10-CM | POA: Diagnosis not present

## 2022-06-12 DIAGNOSIS — Z9229 Personal history of other drug therapy: Secondary | ICD-10-CM | POA: Diagnosis not present

## 2022-06-12 DIAGNOSIS — D649 Anemia, unspecified: Secondary | ICD-10-CM | POA: Diagnosis not present

## 2022-06-12 DIAGNOSIS — K089 Disorder of teeth and supporting structures, unspecified: Secondary | ICD-10-CM | POA: Diagnosis not present

## 2022-06-13 DIAGNOSIS — R2689 Other abnormalities of gait and mobility: Secondary | ICD-10-CM | POA: Diagnosis not present

## 2022-06-13 DIAGNOSIS — J9601 Acute respiratory failure with hypoxia: Secondary | ICD-10-CM | POA: Diagnosis not present

## 2022-06-13 DIAGNOSIS — M6281 Muscle weakness (generalized): Secondary | ICD-10-CM | POA: Diagnosis not present

## 2022-06-16 DIAGNOSIS — R2689 Other abnormalities of gait and mobility: Secondary | ICD-10-CM | POA: Diagnosis not present

## 2022-06-16 DIAGNOSIS — M6281 Muscle weakness (generalized): Secondary | ICD-10-CM | POA: Diagnosis not present

## 2022-06-16 DIAGNOSIS — J9601 Acute respiratory failure with hypoxia: Secondary | ICD-10-CM | POA: Diagnosis not present

## 2022-06-17 DIAGNOSIS — I5042 Chronic combined systolic (congestive) and diastolic (congestive) heart failure: Secondary | ICD-10-CM | POA: Diagnosis not present

## 2022-06-17 DIAGNOSIS — I2510 Atherosclerotic heart disease of native coronary artery without angina pectoris: Secondary | ICD-10-CM | POA: Diagnosis not present

## 2022-06-17 DIAGNOSIS — I4892 Unspecified atrial flutter: Secondary | ICD-10-CM | POA: Diagnosis not present

## 2022-06-17 DIAGNOSIS — N189 Chronic kidney disease, unspecified: Secondary | ICD-10-CM | POA: Diagnosis not present

## 2022-06-17 DIAGNOSIS — Z7984 Long term (current) use of oral hypoglycemic drugs: Secondary | ICD-10-CM | POA: Diagnosis not present

## 2022-06-17 DIAGNOSIS — I130 Hypertensive heart and chronic kidney disease with heart failure and stage 1 through stage 4 chronic kidney disease, or unspecified chronic kidney disease: Secondary | ICD-10-CM | POA: Diagnosis not present

## 2022-06-17 DIAGNOSIS — E1122 Type 2 diabetes mellitus with diabetic chronic kidney disease: Secondary | ICD-10-CM | POA: Diagnosis not present

## 2022-06-17 DIAGNOSIS — E039 Hypothyroidism, unspecified: Secondary | ICD-10-CM | POA: Diagnosis not present

## 2022-07-01 DIAGNOSIS — R2689 Other abnormalities of gait and mobility: Secondary | ICD-10-CM | POA: Diagnosis not present

## 2022-07-01 DIAGNOSIS — M6281 Muscle weakness (generalized): Secondary | ICD-10-CM | POA: Diagnosis not present

## 2022-07-01 DIAGNOSIS — J9601 Acute respiratory failure with hypoxia: Secondary | ICD-10-CM | POA: Diagnosis not present

## 2022-07-02 DIAGNOSIS — M6281 Muscle weakness (generalized): Secondary | ICD-10-CM | POA: Diagnosis not present

## 2022-07-02 DIAGNOSIS — J9601 Acute respiratory failure with hypoxia: Secondary | ICD-10-CM | POA: Diagnosis not present

## 2022-07-02 DIAGNOSIS — R2689 Other abnormalities of gait and mobility: Secondary | ICD-10-CM | POA: Diagnosis not present

## 2022-07-03 DIAGNOSIS — M6281 Muscle weakness (generalized): Secondary | ICD-10-CM | POA: Diagnosis not present

## 2022-07-03 DIAGNOSIS — J9601 Acute respiratory failure with hypoxia: Secondary | ICD-10-CM | POA: Diagnosis not present

## 2022-07-03 DIAGNOSIS — R2689 Other abnormalities of gait and mobility: Secondary | ICD-10-CM | POA: Diagnosis not present

## 2022-07-04 DIAGNOSIS — M6281 Muscle weakness (generalized): Secondary | ICD-10-CM | POA: Diagnosis not present

## 2022-07-04 DIAGNOSIS — J9601 Acute respiratory failure with hypoxia: Secondary | ICD-10-CM | POA: Diagnosis not present

## 2022-07-04 DIAGNOSIS — R2689 Other abnormalities of gait and mobility: Secondary | ICD-10-CM | POA: Diagnosis not present

## 2022-07-07 DIAGNOSIS — J9601 Acute respiratory failure with hypoxia: Secondary | ICD-10-CM | POA: Diagnosis not present

## 2022-07-07 DIAGNOSIS — R2689 Other abnormalities of gait and mobility: Secondary | ICD-10-CM | POA: Diagnosis not present

## 2022-07-07 DIAGNOSIS — M6281 Muscle weakness (generalized): Secondary | ICD-10-CM | POA: Diagnosis not present

## 2022-07-08 DIAGNOSIS — R2689 Other abnormalities of gait and mobility: Secondary | ICD-10-CM | POA: Diagnosis not present

## 2022-07-08 DIAGNOSIS — J9601 Acute respiratory failure with hypoxia: Secondary | ICD-10-CM | POA: Diagnosis not present

## 2022-07-08 DIAGNOSIS — M6281 Muscle weakness (generalized): Secondary | ICD-10-CM | POA: Diagnosis not present

## 2022-07-09 ENCOUNTER — Encounter: Payer: Self-pay | Admitting: Family Medicine

## 2022-07-09 ENCOUNTER — Ambulatory Visit (INDEPENDENT_AMBULATORY_CARE_PROVIDER_SITE_OTHER): Payer: Medicare HMO | Admitting: Family Medicine

## 2022-07-09 VITALS — BP 121/80 | Temp 96.0°F | Resp 18 | Ht 68.0 in | Wt 184.0 lb

## 2022-07-09 DIAGNOSIS — Z9181 History of falling: Secondary | ICD-10-CM | POA: Diagnosis not present

## 2022-07-09 DIAGNOSIS — M6281 Muscle weakness (generalized): Secondary | ICD-10-CM | POA: Diagnosis not present

## 2022-07-09 DIAGNOSIS — E1122 Type 2 diabetes mellitus with diabetic chronic kidney disease: Secondary | ICD-10-CM

## 2022-07-09 DIAGNOSIS — D62 Acute posthemorrhagic anemia: Secondary | ICD-10-CM | POA: Diagnosis not present

## 2022-07-09 DIAGNOSIS — R5381 Other malaise: Secondary | ICD-10-CM | POA: Diagnosis not present

## 2022-07-09 DIAGNOSIS — N182 Chronic kidney disease, stage 2 (mild): Secondary | ICD-10-CM

## 2022-07-09 DIAGNOSIS — I119 Hypertensive heart disease without heart failure: Secondary | ICD-10-CM | POA: Diagnosis not present

## 2022-07-09 DIAGNOSIS — J9601 Acute respiratory failure with hypoxia: Secondary | ICD-10-CM | POA: Diagnosis not present

## 2022-07-09 DIAGNOSIS — R2689 Other abnormalities of gait and mobility: Secondary | ICD-10-CM | POA: Diagnosis not present

## 2022-07-09 DIAGNOSIS — I5032 Chronic diastolic (congestive) heart failure: Secondary | ICD-10-CM | POA: Diagnosis not present

## 2022-07-09 DIAGNOSIS — I10 Essential (primary) hypertension: Secondary | ICD-10-CM

## 2022-07-09 DIAGNOSIS — R52 Pain, unspecified: Secondary | ICD-10-CM | POA: Diagnosis not present

## 2022-07-09 DIAGNOSIS — I4891 Unspecified atrial fibrillation: Secondary | ICD-10-CM | POA: Diagnosis not present

## 2022-07-09 DIAGNOSIS — M6250 Muscle wasting and atrophy, not elsewhere classified, unspecified site: Secondary | ICD-10-CM | POA: Diagnosis not present

## 2022-07-09 NOTE — Progress Notes (Signed)
Provider:  Alain Honey, MD  Careteam: Patient Care Team: [name], MD as PCP - General (Family Medicine) Bensimhon, Shaune Pascal, MD as PCP - Cardiology (Cardiology) Bensimhon, Shaune Pascal, MD as Consulting Physician (Cardiology) Penninger, Ria Comment, [place] as Physician Assistant (Nephrology) Donato Heinz, MD as Consulting Physician (Nephrology) Michael Boston, MD as Consulting Physician (General Surgery) Ralene Bathe, MD as Consulting Physician (Ophthalmology)  PLACE OF SERVICE:  [place]  Advanced Directive information    Allergies  Allergen Reactions   Cozaar [Losartan Potassium] Swelling and Other (See Comments)    Angioedema    Zestril [Lisinopril] Swelling and Other (See Comments)    Angioedema    Crestor [Rosuvastatin] Swelling and Other (See Comments)    Angioedema    Tape Other (See Comments)    Sensitive skin, tears easily.    Chief Complaint  Patient presents with   Hospitalization Follow-up    Six Month Follow up     HPI: Patient is a 89 y.o. male patient is now a resident at [facility] where he has been for the past year after spending 1 week in the hospital with [facility]; he is still receiving physical therapy.  He still hopes to go home. Today he is seated in a wheelchair.  He reports that he does walk some but to go outside the facility he needs transportation.  The providers at [facility] are checking blood work on I am.  Last creatinine was 2.39 and GFR is 31 TSH markedly elevated at 35 He has no specific complaints today, but just here for 24-month follow-up.  He was recently admitted to the hospital with hemoglobin of 6.7 with known hemoglobin baseline of 9-10.  He received 3 units of packed cells by his history  Review of Systems:  Review of Systems  Constitutional: Negative.   Respiratory: Negative.    Cardiovascular: Negative.   Genitourinary:  Positive for frequency.  Skin: Negative.   Psychiatric/Behavioral: Negative.     All other systems reviewed and are negative.   Past Medical History:  Diagnosis Date   Anemia due to chronic kidney disease    Bilateral inguinal hernia    CAD (coronary artery disease) cardiologist-  dr bensimhon   PTCA of OM in 1998, [place] in 2000, Cath 9/07 LM ok LAD ok. LCX 95% in OM prior to previous stent RCA. nondominant normal EF normal. Cypher DES to OM 2007 (PLACED PROXIAMAL TO PREVIOUS STENT)   Chronic kidney disease, stage III (moderate) ([place])    nephrologist-  dr detarding   Depression    Diabetes mellitus type 2, diet-controlled ([place])    followed by pcp,  last A1c 5.2 on 09-10-2017 in epic   Gout, unspecified    10-29-2017  per pt stable   HLD (hyperlipidemia)    HTN (hypertension)    MGUS (monoclonal gammopathy of unknown significance) previously followed by dr Beryle Beams , [place] Clement and released 08/01/2011   IgG kappa dx 2002 8% plasma cells in bone marrow; no lesions on bone X-rays;   Nocturia    OA (osteoarthritis)    OSA on CPAP    per study 01-30-2004  severe osa , AHI 51.6/hr   PAD (peripheral artery disease) ([place])    05-21-2006  left renal artery stenosis, s/p balloon angioplasty and stenting;  last duplex 02/ 2012  normal , arteries patent   S/P coronary artery stent placement    2000--  BMS x1  to OM;   2007-- DES x1  to OM proximal to previous stent   Secondary hyperparathyroidism of renal origin [facility])    Urgency of urination    Wears glasses    Past Surgical History:  Procedure Laterality Date   CARDIOVASCULAR STRESS TEST  2010   per dr bensimhon epic note dated 04-18-2016  normal   CORONARY ANGIOPLASTY  1998   PTCA to [place]   PCI and BMS x1 OM   CORONARY ANGIOPLASTY WITH STENT PLACEMENT  12-18-2005  dr Claiborne Billings   DES x1 to proximal to previously placed stent in OM   CORONARY BALLOON ANGIOPLASTY N/A 08/02/2020   Procedure: [place];  Surgeon: [place] Sine, MD;  Location: [place] [facility]  LAB;  Service: Cardiovascular;  Laterality: N/A;   CORONARY STENT INTERVENTION N/A 08/02/2020   Procedure: CORONARY STENT INTERVENTION;  Surgeon: [place] Sine, MD;  Location: [place] [facility];  Service: Cardiovascular;  Laterality: N/A;   HERNIA REPAIR     INGUINAL HERNIA REPAIR Bilateral 10/30/2017   Procedure: LAPAROSCOPIC  BILATERAL INGUINAL HERNIA REPAIR  AND LEFT [employer];  Surgeon: Michael Boston, MD;  Location: [place];  Service: General;  Laterality: Bilateral;   INSERTION OF MESH Bilateral 10/30/2017   Procedure: INSERTION OF MESH;  Surgeon: Michael Boston, MD;  Location: [place];  Service: General;  Laterality: Bilateral;   LEFT HEART CATH AND CORONARY ANGIOGRAPHY N/A 08/01/2020   Procedure: LEFT HEART CATH AND CORONARY ANGIOGRAPHY;  Surgeon: [place] Sine, MD;  Location: [place] [facility] LAB;  Service: Cardiovascular;  Laterality: N/A;   RENAL ANGIOPLASTY Left 05-21-2006   dr berry   and stenting   TOTAL KNEE ARTHROPLASTY Left 07-22-2005   dr Shellia Carwin  [facility]   TRANSTHORACIC ECHOCARDIOGRAM  04/18/2016   ef 99991111, grade 1 diastolic dysfunction/  mild to moderate AR/  mild MR   Social History:   reports that he has been smoking cigarettes. He has been smoking an average of .25 packs per day. He has never used smokeless tobacco. He reports that he does not currently use alcohol. He reports that he does not use drugs.  Family History  Problem Relation Age of Onset   Heart disease Mother    Healthy Father    Heart disease Sister    Heart disease Sister    Cancer Sister        type unknown   Hypertension Sister    Diabetes Brother    Diabetes Brother    Diabetes Other    Coronary artery disease Other    Cancer Other     Medications: Patient's Medications  New Prescriptions   No medications on file  Previous Medications   ALLOPURINOL (ZYLOPRIM) 100 MG TABLET    Take 1 tablet (100 mg total) by mouth daily.   ATORVASTATIN  (LIPITOR) 80 MG TABLET    Take 1 tablet (80 mg total) by mouth daily.   CETIRIZINE (ZYRTEC) 10 MG TABLET    Take 10 mg by mouth daily.   EMPAGLIFLOZIN (JARDIANCE) 10 MG TABS TABLET    Take 1 tablet (10 mg total) by mouth daily. Patient takes 1 tablet by mouth every other day.   EZETIMIBE (ZETIA) 10 MG TABLET    Take 10 mg by mouth daily.   LEVOTHYROXINE (SYNTHROID) 25 MCG TABLET    Take 25 mcg by mouth daily before breakfast.   MENTHOL, TOPICAL ANALGESIC, (BIOFREEZE) 4 % GEL  Apply 1 application  topically in the morning and at bedtime. To right wrist/hand   MENTHOL, TOPICAL ANALGESIC, (EUCERIN ITCH RELIEF) 0.1 % LOTN    Apply 1 application  topically daily. To rash on chest/abdomin/back/neck/shoulder   MULTIPLE VITAMIN (MULTIVITAMIN) TABLET    Take 1 tablet by mouth daily.   PETROLATUM HYDROPHILIC OINT    Apply 1 application  topically at bedtime. To bilateral nares   SKIN PROTECTANTS, MISC. (EUCERIN) CREAM    Apply 1 Application topically daily.   SODIUM CHLORIDE (OCEAN) 0.65 % SOLN NASAL SPRAY    Place 1 spray into both nostrils daily.   TAMSULOSIN (FLOMAX) 0.4 MG CAPS CAPSULE    Take 0.4 mg by mouth daily.   TORSEMIDE (DEMADEX) 20 MG TABLET    Take 3 tablets (60 mg total) by mouth daily.   WHITE PETROLATUM (VASELINE EX)    Apply 1 application  topically at bedtime. To bilateral nares   ZINC OXIDE 25 % PSTE    Apply 1 application  topically 2 (two) times daily. To bottom during incontinence care  Modified Medications   No medications on file  Discontinued Medications   No medications on file    Physical Exam:  Vitals:   07/09/22 0917  BP: 121/80  Resp: 18  Temp: (!) 96 F (35.6 C)  SpO2: 100%  Weight: 184 lb (83.5 kg)  Height: 5\' 8"  (1.727 m)   Body mass index is 27.98 kg/m. Wt Readings from Last 3 Encounters:  07/09/22 184 lb (83.5 kg)  05/14/22 184 lb (83.5 kg)  03/14/22 178 lb 8 oz (81 kg)    Physical Exam Vitals and nursing note reviewed.  Constitutional:       Appearance: Normal appearance.  Cardiovascular:     Rate and Rhythm: Normal rate and regular rhythm.  Pulmonary:     Effort: Pulmonary effort is normal.     Breath sounds: Normal breath sounds.  Musculoskeletal:     Comments: Strength is appropriate in all extremities  Skin:    General: Skin is warm and dry.  Neurological:     General: No focal deficit present.     Mental Status: He is alert and oriented to person, place, and time.     Labs reviewed: Basic Metabolic Panel: Recent Labs    07/11/21 2133 07/11/21 2313 07/12/21 0646 07/13/21 0356 05/14/22 1355 05/14/22 1848 05/15/22 0309 05/15/22 0927 05/15/22 1842 05/16/22 1418  NA  --   --  137   < > 136 137 138  --   --   --   K  --   --  4.1   < > 4.8 4.6 4.0  --   --   --   CL  --   --  108   < > 100 101 104  --   --   --   CO2  --   --  22   < > 23 20* 23  --   --   --   GLUCOSE  --   --  87   < > 116* 180* 136* 132* 118* 149*  BUN  --   --  36*   < > 80* 83* 81*  --   --   --   CREATININE  --   --  1.96*   < > 2.67* 2.74* 2.39*  --   --   --   CALCIUM  --   --  8.0*   < > 8.6* 8.6*  8.2*  --   --   --   MG 1.9  --  1.7  --   --   --   --   --   --   --   PHOS 3.1  --  3.0  --   --   --   --   --   --   --   TSH  --  36.181* 35.246*  --   --   --   --   --   --   --    < > = values in this interval not displayed.   Liver Function Tests: Recent Labs    05/14/22 1355 05/14/22 1848 05/15/22 0309  AST 39 34 26  ALT 21 17 16   ALKPHOS 61 57 50  BILITOT 0.7 0.5 0.5  PROT 7.6 6.9 6.4*  ALBUMIN 3.8 3.6 3.2*   No results for input(s): "LIPASE", "AMYLASE" in the last 8760 hours. No results for input(s): "AMMONIA" in the last 8760 hours. CBC: Recent Labs    05/14/22 1355 05/14/22 1848 05/15/22 0927 05/15/22 1842 05/16/22 0557  WBC 10.0   < > 14.1* 12.7* 11.9*  NEUTROABS 8.5*  --   --  9.1* 8.2*  HGB 6.9*   < > 8.5* 8.7* 8.3*  HCT 21.9*   < > 25.4* 26.0* 23.1*  MCV 100.5*   < > 94.4 91.9 89.2  PLT 283   < >  251 221 225   < > = values in this interval not displayed.   Lipid Panel: No results for input(s): "CHOL", "HDL", "LDLCALC", "TRIG", "CHOLHDL", "LDLDIRECT" in the last 8760 hours. TSH: Recent Labs    07/11/21 2313 07/12/21 0646  TSH 36.181* 35.246*   A1C: Lab Results  Component Value Date   HGBA1C 5.6 05/14/2022     Assessment/Plan  1. Chronic diastolic heart failure (HCC) Appears compensated at this time on torsemide 20 mg a day  2. Type 2 diabetes mellitus with stage 2 chronic kidney disease, without long-term current use of insulin (HCC) Most recent A1c was 5.6 on Jardiance 10 mg  3. Essential hypertension Blood pressure today 121/80.  He is not on antihypertensive   Alain Honey, MD [place] [phone]

## 2022-07-10 DIAGNOSIS — R2689 Other abnormalities of gait and mobility: Secondary | ICD-10-CM | POA: Diagnosis not present

## 2022-07-10 DIAGNOSIS — J9601 Acute respiratory failure with hypoxia: Secondary | ICD-10-CM | POA: Diagnosis not present

## 2022-07-10 DIAGNOSIS — M6281 Muscle weakness (generalized): Secondary | ICD-10-CM | POA: Diagnosis not present

## 2022-07-11 DIAGNOSIS — I4892 Unspecified atrial flutter: Secondary | ICD-10-CM | POA: Diagnosis not present

## 2022-07-11 DIAGNOSIS — I130 Hypertensive heart and chronic kidney disease with heart failure and stage 1 through stage 4 chronic kidney disease, or unspecified chronic kidney disease: Secondary | ICD-10-CM | POA: Diagnosis not present

## 2022-07-11 DIAGNOSIS — I272 Pulmonary hypertension, unspecified: Secondary | ICD-10-CM | POA: Diagnosis not present

## 2022-07-11 DIAGNOSIS — E039 Hypothyroidism, unspecified: Secondary | ICD-10-CM | POA: Diagnosis not present

## 2022-07-11 DIAGNOSIS — I5042 Chronic combined systolic (congestive) and diastolic (congestive) heart failure: Secondary | ICD-10-CM | POA: Diagnosis not present

## 2022-07-11 DIAGNOSIS — E1151 Type 2 diabetes mellitus with diabetic peripheral angiopathy without gangrene: Secondary | ICD-10-CM | POA: Diagnosis not present

## 2022-07-11 DIAGNOSIS — E1122 Type 2 diabetes mellitus with diabetic chronic kidney disease: Secondary | ICD-10-CM | POA: Diagnosis not present

## 2022-07-11 DIAGNOSIS — N184 Chronic kidney disease, stage 4 (severe): Secondary | ICD-10-CM | POA: Diagnosis not present

## 2022-07-11 DIAGNOSIS — I2510 Atherosclerotic heart disease of native coronary artery without angina pectoris: Secondary | ICD-10-CM | POA: Diagnosis not present

## 2022-07-12 DIAGNOSIS — M6281 Muscle weakness (generalized): Secondary | ICD-10-CM | POA: Diagnosis not present

## 2022-07-12 DIAGNOSIS — R2689 Other abnormalities of gait and mobility: Secondary | ICD-10-CM | POA: Diagnosis not present

## 2022-07-12 DIAGNOSIS — J9601 Acute respiratory failure with hypoxia: Secondary | ICD-10-CM | POA: Diagnosis not present

## 2022-07-13 DIAGNOSIS — M6281 Muscle weakness (generalized): Secondary | ICD-10-CM | POA: Diagnosis not present

## 2022-07-13 DIAGNOSIS — J9601 Acute respiratory failure with hypoxia: Secondary | ICD-10-CM | POA: Diagnosis not present

## 2022-07-13 DIAGNOSIS — R2689 Other abnormalities of gait and mobility: Secondary | ICD-10-CM | POA: Diagnosis not present

## 2022-07-15 ENCOUNTER — Telehealth: Payer: Medicare HMO

## 2022-07-15 DIAGNOSIS — M6281 Muscle weakness (generalized): Secondary | ICD-10-CM | POA: Diagnosis not present

## 2022-07-15 NOTE — Telephone Encounter (Signed)
Patient's son, [name] called requesting letter given him authority to become father's payee for social security.  Message routed to Dr. Alain Honey

## 2022-07-16 DIAGNOSIS — E119 Type 2 diabetes mellitus without complications: Secondary | ICD-10-CM | POA: Diagnosis not present

## 2022-07-16 DIAGNOSIS — M6281 Muscle weakness (generalized): Secondary | ICD-10-CM | POA: Diagnosis not present

## 2022-07-16 NOTE — Telephone Encounter (Signed)
To make that change, will need for pt to approve

## 2022-07-17 DIAGNOSIS — M6281 Muscle weakness (generalized): Secondary | ICD-10-CM | POA: Diagnosis not present

## 2022-07-18 DIAGNOSIS — M6281 Muscle weakness (generalized): Secondary | ICD-10-CM | POA: Diagnosis not present

## 2022-07-19 DIAGNOSIS — M6281 Muscle weakness (generalized): Secondary | ICD-10-CM | POA: Diagnosis not present

## 2022-07-22 DIAGNOSIS — M6281 Muscle weakness (generalized): Secondary | ICD-10-CM | POA: Diagnosis not present

## 2022-07-23 ENCOUNTER — Telehealth: Payer: Self-pay

## 2022-07-23 DIAGNOSIS — M6281 Muscle weakness (generalized): Secondary | ICD-10-CM | POA: Diagnosis not present

## 2022-07-23 NOTE — Telephone Encounter (Signed)
Patients son called stating patient is living at [facility] long-term and the facility is trying to facilitate Mr.Qadri's finances and he would like to remain in charge of Mr.Pursel's finances. Delrae Alfred is requesting a letter stating he will manage Mr.Wadell's money, per Mr.Wadell's request. Delrae Alfred stated he called social security and they told him he would need a letter from his fathers PCP.  I informed Mr.Douglass that usually when a patient is at a skilled facility such as [facility] their care resides with the facility physician. Douglass emphasized that his father prefers to see Dr.Miller.   I asked Mr.Douglass if he had a copy of patients durable power of attorney and he stated no. Delrae Alfred is aware I will send this request to Dr.Miller.  Side note patients durable power of attorney signed 02/13/2020 has Jennefer Bravo as patients agent.   Dr.Miller please advise if patient will need an appointment to address this request or if you are willing to compose a letter as requested

## 2022-07-24 DIAGNOSIS — M6281 Muscle weakness (generalized): Secondary | ICD-10-CM | POA: Diagnosis not present

## 2022-07-25 DIAGNOSIS — M6281 Muscle weakness (generalized): Secondary | ICD-10-CM | POA: Diagnosis not present

## 2022-07-28 DIAGNOSIS — M6281 Muscle weakness (generalized): Secondary | ICD-10-CM | POA: Diagnosis not present

## 2022-07-29 DIAGNOSIS — M6281 Muscle weakness (generalized): Secondary | ICD-10-CM | POA: Diagnosis not present

## 2022-07-30 NOTE — Telephone Encounter (Signed)
Kevin Schwartz, Research officer, political party had a verbal conversation with Dr.Miller the day this message was take and spoke with patients son to inform him that we can not fulfill his request and recommend that they get POA information updated.

## 2022-07-31 DIAGNOSIS — M6281 Muscle weakness (generalized): Secondary | ICD-10-CM | POA: Diagnosis not present

## 2022-08-01 DIAGNOSIS — M6281 Muscle weakness (generalized): Secondary | ICD-10-CM | POA: Diagnosis not present

## 2022-08-04 DIAGNOSIS — J069 Acute upper respiratory infection, unspecified: Secondary | ICD-10-CM | POA: Diagnosis not present

## 2022-08-04 DIAGNOSIS — M6281 Muscle weakness (generalized): Secondary | ICD-10-CM | POA: Diagnosis not present

## 2022-08-04 DIAGNOSIS — Z98818 Other dental procedure status: Secondary | ICD-10-CM | POA: Diagnosis not present

## 2022-08-04 DIAGNOSIS — N189 Chronic kidney disease, unspecified: Secondary | ICD-10-CM | POA: Diagnosis not present

## 2022-08-04 DIAGNOSIS — I5020 Unspecified systolic (congestive) heart failure: Secondary | ICD-10-CM | POA: Diagnosis not present

## 2022-08-04 DIAGNOSIS — I130 Hypertensive heart and chronic kidney disease with heart failure and stage 1 through stage 4 chronic kidney disease, or unspecified chronic kidney disease: Secondary | ICD-10-CM | POA: Diagnosis not present

## 2022-08-05 DIAGNOSIS — M6281 Muscle weakness (generalized): Secondary | ICD-10-CM | POA: Diagnosis not present

## 2022-08-06 DIAGNOSIS — M6281 Muscle weakness (generalized): Secondary | ICD-10-CM | POA: Diagnosis not present

## 2022-08-07 DIAGNOSIS — I739 Peripheral vascular disease, unspecified: Secondary | ICD-10-CM | POA: Diagnosis not present

## 2022-08-07 DIAGNOSIS — M6281 Muscle weakness (generalized): Secondary | ICD-10-CM | POA: Diagnosis not present

## 2022-08-07 DIAGNOSIS — L603 Nail dystrophy: Secondary | ICD-10-CM | POA: Diagnosis not present

## 2022-08-08 DIAGNOSIS — Z7984 Long term (current) use of oral hypoglycemic drugs: Secondary | ICD-10-CM | POA: Diagnosis not present

## 2022-08-08 DIAGNOSIS — N184 Chronic kidney disease, stage 4 (severe): Secondary | ICD-10-CM | POA: Diagnosis not present

## 2022-08-08 DIAGNOSIS — E1122 Type 2 diabetes mellitus with diabetic chronic kidney disease: Secondary | ICD-10-CM | POA: Diagnosis not present

## 2022-08-08 DIAGNOSIS — M6281 Muscle weakness (generalized): Secondary | ICD-10-CM | POA: Diagnosis not present

## 2022-08-08 DIAGNOSIS — I130 Hypertensive heart and chronic kidney disease with heart failure and stage 1 through stage 4 chronic kidney disease, or unspecified chronic kidney disease: Secondary | ICD-10-CM | POA: Diagnosis not present

## 2022-08-08 DIAGNOSIS — D631 Anemia in chronic kidney disease: Secondary | ICD-10-CM | POA: Diagnosis not present

## 2022-08-08 DIAGNOSIS — D472 Monoclonal gammopathy: Secondary | ICD-10-CM | POA: Diagnosis not present

## 2022-08-08 DIAGNOSIS — I272 Pulmonary hypertension, unspecified: Secondary | ICD-10-CM | POA: Diagnosis not present

## 2022-08-08 DIAGNOSIS — I5042 Chronic combined systolic (congestive) and diastolic (congestive) heart failure: Secondary | ICD-10-CM | POA: Diagnosis not present

## 2022-08-11 DIAGNOSIS — M6281 Muscle weakness (generalized): Secondary | ICD-10-CM | POA: Diagnosis not present

## 2022-08-12 DIAGNOSIS — M6281 Muscle weakness (generalized): Secondary | ICD-10-CM | POA: Diagnosis not present

## 2022-08-13 DIAGNOSIS — R1311 Dysphagia, oral phase: Secondary | ICD-10-CM | POA: Diagnosis not present

## 2022-08-13 DIAGNOSIS — I5043 Acute on chronic combined systolic (congestive) and diastolic (congestive) heart failure: Secondary | ICD-10-CM | POA: Diagnosis not present

## 2022-08-13 DIAGNOSIS — M6281 Muscle weakness (generalized): Secondary | ICD-10-CM | POA: Diagnosis not present

## 2022-08-14 DIAGNOSIS — R1311 Dysphagia, oral phase: Secondary | ICD-10-CM | POA: Diagnosis not present

## 2022-08-14 DIAGNOSIS — M6281 Muscle weakness (generalized): Secondary | ICD-10-CM | POA: Diagnosis not present

## 2022-08-14 DIAGNOSIS — I5043 Acute on chronic combined systolic (congestive) and diastolic (congestive) heart failure: Secondary | ICD-10-CM | POA: Diagnosis not present

## 2022-08-15 DIAGNOSIS — R1311 Dysphagia, oral phase: Secondary | ICD-10-CM | POA: Diagnosis not present

## 2022-08-15 DIAGNOSIS — I5043 Acute on chronic combined systolic (congestive) and diastolic (congestive) heart failure: Secondary | ICD-10-CM | POA: Diagnosis not present

## 2022-08-15 DIAGNOSIS — M6281 Muscle weakness (generalized): Secondary | ICD-10-CM | POA: Diagnosis not present

## 2022-08-18 DIAGNOSIS — E119 Type 2 diabetes mellitus without complications: Secondary | ICD-10-CM | POA: Diagnosis not present

## 2022-08-18 DIAGNOSIS — R1311 Dysphagia, oral phase: Secondary | ICD-10-CM | POA: Diagnosis not present

## 2022-08-18 DIAGNOSIS — I5043 Acute on chronic combined systolic (congestive) and diastolic (congestive) heart failure: Secondary | ICD-10-CM | POA: Diagnosis not present

## 2022-08-18 DIAGNOSIS — M6281 Muscle weakness (generalized): Secondary | ICD-10-CM | POA: Diagnosis not present

## 2022-08-20 ENCOUNTER — Encounter (HOSPITAL_COMMUNITY): Payer: Self-pay | Admitting: Internal Medicine

## 2022-08-20 ENCOUNTER — Ambulatory Visit (HOSPITAL_COMMUNITY)
Admission: RE | Admit: 2022-08-20 | Discharge: 2022-08-20 | Disposition: A | Discharge status: Home / Self Care | Payer: No Typology Code available for payment source | Source: Ambulatory Visit | Attending: Internal Medicine | Admitting: Internal Medicine

## 2022-08-20 VITALS — BP 100/60 | HR 66 | Wt 183.2 lb

## 2022-08-20 DIAGNOSIS — N1832 Chronic kidney disease, stage 3b: Secondary | ICD-10-CM | POA: Diagnosis not present

## 2022-08-20 DIAGNOSIS — Z955 Presence of coronary angioplasty implant and graft: Secondary | ICD-10-CM | POA: Insufficient documentation

## 2022-08-20 DIAGNOSIS — E1151 Type 2 diabetes mellitus with diabetic peripheral angiopathy without gangrene: Secondary | ICD-10-CM | POA: Insufficient documentation

## 2022-08-20 DIAGNOSIS — I10 Essential (primary) hypertension: Secondary | ICD-10-CM | POA: Diagnosis not present

## 2022-08-20 DIAGNOSIS — R1311 Dysphagia, oral phase: Secondary | ICD-10-CM | POA: Diagnosis not present

## 2022-08-20 DIAGNOSIS — Z7984 Long term (current) use of oral hypoglycemic drugs: Secondary | ICD-10-CM | POA: Diagnosis not present

## 2022-08-20 DIAGNOSIS — I441 Atrioventricular block, second degree: Secondary | ICD-10-CM | POA: Insufficient documentation

## 2022-08-20 DIAGNOSIS — I082 Rheumatic disorders of both aortic and tricuspid valves: Secondary | ICD-10-CM | POA: Insufficient documentation

## 2022-08-20 DIAGNOSIS — E1122 Type 2 diabetes mellitus with diabetic chronic kidney disease: Secondary | ICD-10-CM | POA: Diagnosis not present

## 2022-08-20 DIAGNOSIS — I130 Hypertensive heart and chronic kidney disease with heart failure and stage 1 through stage 4 chronic kidney disease, or unspecified chronic kidney disease: Secondary | ICD-10-CM | POA: Diagnosis not present

## 2022-08-20 DIAGNOSIS — I5032 Chronic diastolic (congestive) heart failure: Secondary | ICD-10-CM | POA: Diagnosis not present

## 2022-08-20 DIAGNOSIS — I255 Ischemic cardiomyopathy: Secondary | ICD-10-CM | POA: Diagnosis not present

## 2022-08-20 DIAGNOSIS — N183 Chronic kidney disease, stage 3 unspecified: Secondary | ICD-10-CM | POA: Diagnosis not present

## 2022-08-20 DIAGNOSIS — I252 Old myocardial infarction: Secondary | ICD-10-CM | POA: Insufficient documentation

## 2022-08-20 DIAGNOSIS — R040 Epistaxis: Secondary | ICD-10-CM | POA: Diagnosis not present

## 2022-08-20 DIAGNOSIS — I2510 Atherosclerotic heart disease of native coronary artery without angina pectoris: Secondary | ICD-10-CM | POA: Diagnosis not present

## 2022-08-20 DIAGNOSIS — Z79899 Other long term (current) drug therapy: Secondary | ICD-10-CM | POA: Insufficient documentation

## 2022-08-20 DIAGNOSIS — I5043 Acute on chronic combined systolic (congestive) and diastolic (congestive) heart failure: Secondary | ICD-10-CM | POA: Diagnosis not present

## 2022-08-20 DIAGNOSIS — I4892 Unspecified atrial flutter: Secondary | ICD-10-CM | POA: Diagnosis not present

## 2022-08-20 DIAGNOSIS — M25512 Pain in left shoulder: Secondary | ICD-10-CM | POA: Insufficient documentation

## 2022-08-20 DIAGNOSIS — Z7901 Long term (current) use of anticoagulants: Secondary | ICD-10-CM | POA: Diagnosis not present

## 2022-08-20 DIAGNOSIS — M6281 Muscle weakness (generalized): Secondary | ICD-10-CM | POA: Diagnosis not present

## 2022-08-20 LAB — BASIC METABOLIC PANEL
Anion gap: 11 (ref 5–15)
BUN: 36 mg/dL — ABNORMAL HIGH (ref 8–23)
CO2: 24 mmol/L (ref 22–32)
Calcium: 9.2 mg/dL (ref 8.9–10.3)
Chloride: 102 mmol/L (ref 98–111)
Creatinine, Ser: 2.24 mg/dL — ABNORMAL HIGH (ref 0.61–1.24)
GFR, Estimated: 27 mL/min — ABNORMAL LOW (ref >60)
Glucose, Bld: 93 mg/dL (ref 70–99)
Potassium: 3.8 mmol/L (ref 3.5–5.1)
Sodium: 137 mmol/L (ref 135–145)

## 2022-08-20 LAB — BRAIN NATRIURETIC PEPTIDE: B Natriuretic Peptide: 238.9 pg/mL — ABNORMAL HIGH (ref 0.0–100.0)

## 2022-08-20 NOTE — Patient Instructions (Signed)
Good to see you today!  No medication changes were made   Labs done today, your results will be available in MyChart, we will contact you for abnormal readings.  Your physician recommends that you schedule a follow-up appointment in: 6 months(November)Call office in  September to schedule an appointment  If you have any questions or concerns before your next appointment please send [place] a message through [employer] or call our office at [phone].    TO LEAVE A MESSAGE FOR THE NURSE SELECT OPTION 2, PLEASE LEAVE A MESSAGE INCLUDING: YOUR NAME DATE OF BIRTH CALL BACK NUMBER REASON FOR CALL**this is important as we prioritize the call backs  YOU WILL RECEIVE A CALL BACK THE SAME DAY AS LONG AS YOU CALL BEFORE 4:00 PM  At the [facility], you and your health needs are our priority. As part of our continuing mission to provide you with exceptional heart care, we have created designated Provider Care Teams. These Care Teams include your primary Cardiologist (physician) and Advanced Practice Providers (APPs- Physician Assistants and Nurse Practitioners) who all work together to provide you with the care you need, when you need it.   You may see any of the following providers on your designated Care Team at your next follow up: Dr Arvilla Meres Dr Marca Ancona Dr. Marcos Eke, NP Robbie Lis, [place] [employer] [place], [place] Brynda Peon, NP Karle Plumber, PharmD   Please be sure to bring in all your medications bottles to every appointment.    Thank you for choosing [facility]

## 2022-08-20 NOTE — Progress Notes (Signed)
[facility] Note  WUJ:WJXBJY, Kevin Millard, MD HF Cardioligst: Dr. Gala Romney  HPI: Kevin Schwartz is an 89 y.o.male with  DM2, HTN, HL, mild renal insufficiency, RAS s/p stenting of L renal artery in 2008, CAD s/p multiple PCIs.  Admitted 4/22 with left shoulder pain -> NSTEMI. Echo EF 45-50%, HK of basal mid anteroseptal wall. Cath  multivessel CAD with 85% proximal RCA, 85% mid RCA, 90% distal RCA, 80% mid LAD, 80% OM 2 lesion. Staged PCI with DES to the mid LAD, balloon angioplasty of left circumflex lesion, the diffusely diseased RCA was treated medically.  4/22 Zio - Sinus with 1AVB occasional Wenckebach and transient junctional rhythm   Admitted 1/23 with COVID. Echo EF 45 - 50% global HK. RV normal.  Mod-sev MR (previously mild).   Admitted 3/23 with ADHF  Echo EF 45-50%, RV mildly reduced, PASP 67.4 mmHg, moderate to severe MR, severe TR, mild to moderate AI. Also with epistaxis requiring anterior nasal packing. EP consulted due to Mobitz Type 1 with 3.4 second pauses. Recommended conservative management with no nodal blocking agents..   Seen in ED 07/22/21 with fall, hit head. CT head negative. Follow up 4/23, volume up, REDs  41%. Torsemide increased to 40 bid x 2 days, then 60 mg daily.   Follow up 6/23, NYHA II-early III, volume stable on torsemide 40 mg. Qtc > 600 on ECG and SSRI stopped.  Today he returns for HF follow up. Lives at [name]. He walks with a walker some and is not SOB with this. Uses WC for going further distances. Wears O2 at night. Says he has good days and bad days. No edema, orthopnea or PND. Tolerating meds ok.   ROS: All systems reviewed and negative except as per HPI.   Past Medical History:  Diagnosis Date   Anemia due to chronic kidney disease    Bilateral inguinal hernia    CAD (coronary artery disease) cardiologist-  dr Keyasha Miah   PTCA of OM in 1998, BMS OM in 2000, Cath 9/07 LM ok LAD ok. LCX 95% in OM prior to previous stent  RCA. nondominant normal EF normal. Cypher DES to OM 2007 (PLACED PROXIAMAL TO PREVIOUS STENT)   Chronic kidney disease, stage III (moderate) (HCC)    nephrologist-  dr detarding   Depression    Diabetes mellitus type 2, diet-controlled (HCC)    followed by pcp,  last A1c 5.2 on 09-10-2017 in epic   Gout, unspecified    10-29-2017  per pt stable   HLD (hyperlipidemia)    HTN (hypertension)    MGUS (monoclonal gammopathy of unknown significance) previously followed by dr Cyndie Chime , Theron Arista and released 08/01/2011   IgG kappa dx 2002 8% plasma cells in bone marrow; no lesions on bone X-rays;   Nocturia    OA (osteoarthritis)    OSA on CPAP    per study 01-30-2004  severe osa , AHI 51.6/hr   PAD (peripheral artery disease) (HCC)    05-21-2006  left renal artery stenosis, s/p balloon angioplasty and stenting;  last duplex 02/ 2012  normal , arteries patent   S/P coronary artery stent placement    2000--  BMS x1  to OM;   2007-- DES x1  to OM proximal to previous stent   Secondary hyperparathyroidism of renal origin [name])    Urgency of urination    Wears glasses     Current Outpatient Medications  Medication Sig Dispense Refill   ACCU-CHEK SOFTCLIX  LANCETS lancets Use to test blood sugar daily. Dx:E11.8 300 each 3   acetaminophen (TYLENOL) 500 MG tablet Take 1,000 mg by mouth every 8 (eight) hours as needed for moderate pain.     Alcohol Swabs (B-D SINGLE USE SWABS REGULAR) PADS Use with testing of blood sugar. Dx:E11.8 300 each 3   allopurinol (ZYLOPRIM) 100 MG tablet Take 1 tablet (100 mg total) by mouth daily. 90 tablet 3   apixaban (ELIQUIS) 2.5 MG TABS tablet Take 1 tablet (2.5 mg total) by mouth 2 (two) times daily. 60 tablet    aspirin EC 81 MG tablet Take 81 mg by mouth daily. Swallow whole.     atorvastatin (LIPITOR) 80 MG tablet Take 1 tablet (80 mg total) by mouth daily. 90 tablet 3   diphenhydrAMINE (BENADRYL) 25 MG tablet Take 25 mg by mouth 2 (two) times daily as needed for  itching.     Emollient (EUCERIN) lotion Apply 1 application. topically daily. Apply to rash on  chest, abdomen, back,neck and shoulders     ezetimibe (ZETIA) 10 MG tablet Take 10 mg by mouth daily.     glucose blood (ACCU-CHEK AVIVA PLUS) test strip Use to check blood sugar daily. Dx:E11.8 300 each 3   guaiFENesin (MUCINEX) 600 MG 12 hr tablet Take 600 mg by mouth 2 (two) times daily.     ipratropium-albuterol (DUONEB) 0.5-2.5 (3) MG/3ML SOLN Inhale 3 mLs into the lungs 3 (three) times daily.     levothyroxine (SYNTHROID) 50 MCG tablet Patient takes 1/2 tablet by mouth before breakfast with water only.     lidocaine (LIDODERM) 5 % Place 1 patch onto the skin daily. Remove & Discard patch within 12 hours or as directed by MD (Patient taking differently: Place 1 patch onto the skin See admin instructions. Apply to left knee in the morning,remove at bedtime) 5 patch 0   nitroGLYCERIN (NITROSTAT) 0.4 MG SL tablet DISSOLVE 1 TABLET UNDER THE TONGUE EVERY 5 MINUTES AS  NEEDED FOR CHEST PAIN. MAX  OF 3 TABLETS IN 15 MINUTES. CALL 911 IF PAIN PERSISTS. 75 tablet 1   tamsulosin (FLOMAX) 0.4 MG CAPS capsule Take 0.4 mg by mouth daily.     torsemide (DEMADEX) 20 MG tablet Take 3 tablets (60 mg total) by mouth daily.  3   white petrolatum (VASELINE) GEL Apply 1 application. topically 3 (three) times daily as needed (inside bilateral nares).     Zinc Oxide 25 % PSTE Apply 1 application. topically in the morning and at bedtime. Apply to bottom during incontinence care     No current facility-administered medications for this encounter.   Social History   Tobacco Use   Smoking status: Some Days    Packs/day: 0.25    Types: Cigarettes   Smokeless tobacco: Never   Tobacco comments:    10-29-2017  per pt was smoking 1pp2d, stopped June 2019  Vaping Use   Vaping Use: Never used  Substance Use Topics   Alcohol use: Not Currently   Drug use: No   Family History  Problem Relation Age of Onset   Heart  disease Mother    Healthy Father    Heart disease Sister    Heart disease Sister    Cancer Sister        type unknown   Hypertension Sister    Diabetes Brother    Diabetes Brother    Diabetes Other    Coronary artery disease Other    Cancer Other  Wt Readings from Last 3 Encounters:  01/14/22 74.7 kg (164 lb 9.6 oz)  08/26/21 63.4 kg (139 lb 12.8 oz)  07/31/21 70.8 kg (156 lb)   BP 106/62   Pulse 74   Wt 74.7 kg (164 lb 9.6 oz)   SpO2 100%   BMI 25.03 kg/m   PHYSICAL EXAM: General:  NAD. No resp difficulty, arrived in [facility] elderly HEENT: normal Neck: supple. no JVD. Carotids 2+ bilat; no bruits. No lymphadenopathy or thryomegaly appreciated. Cor: PMI nondisplaced. Irregular rate & rhythm. 2/6 TR Lungs: clear Abdomen: soft, nontender, nondistended. No hepatosplenomegaly. No bruits or masses. Good bowel sounds. Extremities: no cyanosis, clubbing, rash, edema Neuro: alert & orientedx3, cranial nerves grossly intact. moves all 4 extremities w/o difficulty. Affect pleasant  ECG Sinus rhythm with Wenckebach Personally reviewed  ASSESSMENT & PLAN:  1. Chronic Diastolic Heart Failure/Ischemic CM - Echo (4/22): EF 45-50%  - Echo (3/23): EF 45-50%, LV mildly down with global LV HK, mild LVH, RV mildly reduced, severely elevated PASP 67.4 mmHg, moderate to severe MR, severe TR, mild to moderate AI. - Stable NYHA II-III though assessment of functional class difficult due to deconditioning.  - Continue torsemide 60 mg daily. - Continue Jardiance 10 mg daily.  - No spiro w/ last SCr > 2. - No b-blocker due to HB. - No ACE/ARB/ARNi with hx angioedema on ace and arb. - Labs today   2. CAD  - s/p NSTEMI 4/22.  - Cath 4/22 with multivessel CAD with 85% proximal RCA, 85% mid RCA, 90% distal RCA, 80% mid LAD, 80% OM 2 lesion. Staged PCI with DES to the mid LAD, balloon angioplasty of left circumflex lesion, the diffusely diseased RCA was treated medically. - No s/s angina. - On  DOAC - Continue statin. - Continue Jardiance.  3. Paroxysmal Atrial Flutter - Continue Eliquis 2.5 mg bid. - CHA2DS2-VASc Score = 5  - 39% burden by zio 09/2020 - Seen by EP during recent admit, not a good candidate for antiarrhythmic drugs. - Regular on exam today.  4. Mobitz 1 second degree HB in setting of NSTEMI 4/22 - No beta blocker. - No current indication for PPM. - stable on ECG today  5. HTN - Blood pressure well controlled.  - Continue current regimen. - Avoid hypotension  6. Aortic insufficiency/severe TR/moderate MR - Mild AI on echo 4/22 (stable from 2018) - Mild to moderate AI on echo 3/23. - Severe TR on echo 3/23 - Moderate to severe MR on echo 3/23 - Not mTEER candidate currently with lack of symptoms and with frailty/debility. - Clinically stable. Follow.  7. CKD 3b - Baseline Scr ~2.0 - On SGLT2 - He is followed by [facility]. - Avoid hypotension - Labs today  8.T2DM - On SGLT2i.  - No GU symptoms.  9. Physical deconditioning - Continue PT at [facility]. - Palliative is following at facility.  Arvilla Meres, MD  1:45 PM

## 2022-08-21 DIAGNOSIS — R1311 Dysphagia, oral phase: Secondary | ICD-10-CM | POA: Diagnosis not present

## 2022-08-21 DIAGNOSIS — M6281 Muscle weakness (generalized): Secondary | ICD-10-CM | POA: Diagnosis not present

## 2022-08-21 DIAGNOSIS — I5043 Acute on chronic combined systolic (congestive) and diastolic (congestive) heart failure: Secondary | ICD-10-CM | POA: Diagnosis not present

## 2022-08-22 DIAGNOSIS — I5043 Acute on chronic combined systolic (congestive) and diastolic (congestive) heart failure: Secondary | ICD-10-CM | POA: Diagnosis not present

## 2022-08-22 DIAGNOSIS — M6281 Muscle weakness (generalized): Secondary | ICD-10-CM | POA: Diagnosis not present

## 2022-08-22 DIAGNOSIS — R1311 Dysphagia, oral phase: Secondary | ICD-10-CM | POA: Diagnosis not present

## 2022-08-25 DIAGNOSIS — M6281 Muscle weakness (generalized): Secondary | ICD-10-CM | POA: Diagnosis not present

## 2022-08-25 DIAGNOSIS — R1311 Dysphagia, oral phase: Secondary | ICD-10-CM | POA: Diagnosis not present

## 2022-08-25 DIAGNOSIS — I5043 Acute on chronic combined systolic (congestive) and diastolic (congestive) heart failure: Secondary | ICD-10-CM | POA: Diagnosis not present

## 2022-08-26 DIAGNOSIS — E1122 Type 2 diabetes mellitus with diabetic chronic kidney disease: Secondary | ICD-10-CM | POA: Diagnosis not present

## 2022-08-26 DIAGNOSIS — I130 Hypertensive heart and chronic kidney disease with heart failure and stage 1 through stage 4 chronic kidney disease, or unspecified chronic kidney disease: Secondary | ICD-10-CM | POA: Diagnosis not present

## 2022-08-26 DIAGNOSIS — R1311 Dysphagia, oral phase: Secondary | ICD-10-CM | POA: Diagnosis not present

## 2022-08-26 DIAGNOSIS — I5043 Acute on chronic combined systolic (congestive) and diastolic (congestive) heart failure: Secondary | ICD-10-CM | POA: Diagnosis not present

## 2022-08-26 DIAGNOSIS — M25561 Pain in right knee: Secondary | ICD-10-CM | POA: Diagnosis not present

## 2022-08-26 DIAGNOSIS — I4892 Unspecified atrial flutter: Secondary | ICD-10-CM | POA: Diagnosis not present

## 2022-08-26 DIAGNOSIS — I5020 Unspecified systolic (congestive) heart failure: Secondary | ICD-10-CM | POA: Diagnosis not present

## 2022-08-26 DIAGNOSIS — N189 Chronic kidney disease, unspecified: Secondary | ICD-10-CM | POA: Diagnosis not present

## 2022-08-26 DIAGNOSIS — E039 Hypothyroidism, unspecified: Secondary | ICD-10-CM | POA: Diagnosis not present

## 2022-08-26 DIAGNOSIS — M6281 Muscle weakness (generalized): Secondary | ICD-10-CM | POA: Diagnosis not present

## 2022-08-27 DIAGNOSIS — I5043 Acute on chronic combined systolic (congestive) and diastolic (congestive) heart failure: Secondary | ICD-10-CM | POA: Diagnosis not present

## 2022-08-27 DIAGNOSIS — M6281 Muscle weakness (generalized): Secondary | ICD-10-CM | POA: Diagnosis not present

## 2022-08-27 DIAGNOSIS — R1311 Dysphagia, oral phase: Secondary | ICD-10-CM | POA: Diagnosis not present

## 2022-08-28 DIAGNOSIS — I5043 Acute on chronic combined systolic (congestive) and diastolic (congestive) heart failure: Secondary | ICD-10-CM | POA: Diagnosis not present

## 2022-08-28 DIAGNOSIS — R1311 Dysphagia, oral phase: Secondary | ICD-10-CM | POA: Diagnosis not present

## 2022-08-28 DIAGNOSIS — M6281 Muscle weakness (generalized): Secondary | ICD-10-CM | POA: Diagnosis not present

## 2022-08-29 DIAGNOSIS — R1311 Dysphagia, oral phase: Secondary | ICD-10-CM | POA: Diagnosis not present

## 2022-08-29 DIAGNOSIS — M6281 Muscle weakness (generalized): Secondary | ICD-10-CM | POA: Diagnosis not present

## 2022-08-29 DIAGNOSIS — I5043 Acute on chronic combined systolic (congestive) and diastolic (congestive) heart failure: Secondary | ICD-10-CM | POA: Diagnosis not present

## 2022-09-01 DIAGNOSIS — R1311 Dysphagia, oral phase: Secondary | ICD-10-CM | POA: Diagnosis not present

## 2022-09-01 DIAGNOSIS — M6281 Muscle weakness (generalized): Secondary | ICD-10-CM | POA: Diagnosis not present

## 2022-09-01 DIAGNOSIS — I5043 Acute on chronic combined systolic (congestive) and diastolic (congestive) heart failure: Secondary | ICD-10-CM | POA: Diagnosis not present

## 2022-09-02 DIAGNOSIS — I5043 Acute on chronic combined systolic (congestive) and diastolic (congestive) heart failure: Secondary | ICD-10-CM | POA: Diagnosis not present

## 2022-09-02 DIAGNOSIS — R1311 Dysphagia, oral phase: Secondary | ICD-10-CM | POA: Diagnosis not present

## 2022-09-02 DIAGNOSIS — M6281 Muscle weakness (generalized): Secondary | ICD-10-CM | POA: Diagnosis not present

## 2022-09-03 DIAGNOSIS — R1311 Dysphagia, oral phase: Secondary | ICD-10-CM | POA: Diagnosis not present

## 2022-09-03 DIAGNOSIS — I5043 Acute on chronic combined systolic (congestive) and diastolic (congestive) heart failure: Secondary | ICD-10-CM | POA: Diagnosis not present

## 2022-09-03 DIAGNOSIS — M6281 Muscle weakness (generalized): Secondary | ICD-10-CM | POA: Diagnosis not present

## 2022-09-04 DIAGNOSIS — I5043 Acute on chronic combined systolic (congestive) and diastolic (congestive) heart failure: Secondary | ICD-10-CM | POA: Diagnosis not present

## 2022-09-04 DIAGNOSIS — M6281 Muscle weakness (generalized): Secondary | ICD-10-CM | POA: Diagnosis not present

## 2022-09-04 DIAGNOSIS — R1311 Dysphagia, oral phase: Secondary | ICD-10-CM | POA: Diagnosis not present

## 2022-09-05 DIAGNOSIS — I5043 Acute on chronic combined systolic (congestive) and diastolic (congestive) heart failure: Secondary | ICD-10-CM | POA: Diagnosis not present

## 2022-09-05 DIAGNOSIS — M6281 Muscle weakness (generalized): Secondary | ICD-10-CM | POA: Diagnosis not present

## 2022-09-05 DIAGNOSIS — R1311 Dysphagia, oral phase: Secondary | ICD-10-CM | POA: Diagnosis not present

## 2022-09-08 DIAGNOSIS — R1311 Dysphagia, oral phase: Secondary | ICD-10-CM | POA: Diagnosis not present

## 2022-09-08 DIAGNOSIS — M6281 Muscle weakness (generalized): Secondary | ICD-10-CM | POA: Diagnosis not present

## 2022-09-08 DIAGNOSIS — I5043 Acute on chronic combined systolic (congestive) and diastolic (congestive) heart failure: Secondary | ICD-10-CM | POA: Diagnosis not present

## 2022-09-09 DIAGNOSIS — R1311 Dysphagia, oral phase: Secondary | ICD-10-CM | POA: Diagnosis not present

## 2022-09-09 DIAGNOSIS — M6281 Muscle weakness (generalized): Secondary | ICD-10-CM | POA: Diagnosis not present

## 2022-09-09 DIAGNOSIS — I5043 Acute on chronic combined systolic (congestive) and diastolic (congestive) heart failure: Secondary | ICD-10-CM | POA: Diagnosis not present

## 2022-09-10 DIAGNOSIS — R1311 Dysphagia, oral phase: Secondary | ICD-10-CM | POA: Diagnosis not present

## 2022-09-10 DIAGNOSIS — M6281 Muscle weakness (generalized): Secondary | ICD-10-CM | POA: Diagnosis not present

## 2022-09-10 DIAGNOSIS — I5043 Acute on chronic combined systolic (congestive) and diastolic (congestive) heart failure: Secondary | ICD-10-CM | POA: Diagnosis not present

## 2022-09-11 DIAGNOSIS — I5043 Acute on chronic combined systolic (congestive) and diastolic (congestive) heart failure: Secondary | ICD-10-CM | POA: Diagnosis not present

## 2022-09-11 DIAGNOSIS — R1311 Dysphagia, oral phase: Secondary | ICD-10-CM | POA: Diagnosis not present

## 2022-09-11 DIAGNOSIS — M6281 Muscle weakness (generalized): Secondary | ICD-10-CM | POA: Diagnosis not present

## 2022-09-12 DIAGNOSIS — M6281 Muscle weakness (generalized): Secondary | ICD-10-CM | POA: Diagnosis not present

## 2022-09-12 DIAGNOSIS — I5043 Acute on chronic combined systolic (congestive) and diastolic (congestive) heart failure: Secondary | ICD-10-CM | POA: Diagnosis not present

## 2022-09-12 DIAGNOSIS — R1311 Dysphagia, oral phase: Secondary | ICD-10-CM | POA: Diagnosis not present

## 2022-09-15 DIAGNOSIS — R1311 Dysphagia, oral phase: Secondary | ICD-10-CM | POA: Diagnosis not present

## 2022-09-15 DIAGNOSIS — M6281 Muscle weakness (generalized): Secondary | ICD-10-CM | POA: Diagnosis not present

## 2022-09-15 DIAGNOSIS — I5043 Acute on chronic combined systolic (congestive) and diastolic (congestive) heart failure: Secondary | ICD-10-CM | POA: Diagnosis not present

## 2022-09-16 DIAGNOSIS — M6281 Muscle weakness (generalized): Secondary | ICD-10-CM | POA: Diagnosis not present

## 2022-09-16 DIAGNOSIS — I5043 Acute on chronic combined systolic (congestive) and diastolic (congestive) heart failure: Secondary | ICD-10-CM | POA: Diagnosis not present

## 2022-09-16 DIAGNOSIS — R1311 Dysphagia, oral phase: Secondary | ICD-10-CM | POA: Diagnosis not present

## 2022-09-17 DIAGNOSIS — M6281 Muscle weakness (generalized): Secondary | ICD-10-CM | POA: Diagnosis not present

## 2022-09-17 DIAGNOSIS — R1311 Dysphagia, oral phase: Secondary | ICD-10-CM | POA: Diagnosis not present

## 2022-09-17 DIAGNOSIS — I5043 Acute on chronic combined systolic (congestive) and diastolic (congestive) heart failure: Secondary | ICD-10-CM | POA: Diagnosis not present

## 2022-09-18 DIAGNOSIS — I5043 Acute on chronic combined systolic (congestive) and diastolic (congestive) heart failure: Secondary | ICD-10-CM | POA: Diagnosis not present

## 2022-09-18 DIAGNOSIS — R1311 Dysphagia, oral phase: Secondary | ICD-10-CM | POA: Diagnosis not present

## 2022-09-18 DIAGNOSIS — M6281 Muscle weakness (generalized): Secondary | ICD-10-CM | POA: Diagnosis not present

## 2022-09-23 DIAGNOSIS — M6281 Muscle weakness (generalized): Secondary | ICD-10-CM | POA: Diagnosis not present

## 2022-09-23 DIAGNOSIS — R1311 Dysphagia, oral phase: Secondary | ICD-10-CM | POA: Diagnosis not present

## 2022-09-23 DIAGNOSIS — I5043 Acute on chronic combined systolic (congestive) and diastolic (congestive) heart failure: Secondary | ICD-10-CM | POA: Diagnosis not present

## 2022-09-24 DIAGNOSIS — I5043 Acute on chronic combined systolic (congestive) and diastolic (congestive) heart failure: Secondary | ICD-10-CM | POA: Diagnosis not present

## 2022-09-24 DIAGNOSIS — R1311 Dysphagia, oral phase: Secondary | ICD-10-CM | POA: Diagnosis not present

## 2022-09-24 DIAGNOSIS — M6281 Muscle weakness (generalized): Secondary | ICD-10-CM | POA: Diagnosis not present

## 2022-09-25 ENCOUNTER — Telehealth: Payer: Self-pay

## 2022-09-25 DIAGNOSIS — R1311 Dysphagia, oral phase: Secondary | ICD-10-CM | POA: Diagnosis not present

## 2022-09-25 DIAGNOSIS — M6281 Muscle weakness (generalized): Secondary | ICD-10-CM | POA: Diagnosis not present

## 2022-09-25 DIAGNOSIS — I5043 Acute on chronic combined systolic (congestive) and diastolic (congestive) heart failure: Secondary | ICD-10-CM | POA: Diagnosis not present

## 2022-09-25 NOTE — Telephone Encounter (Signed)
Called patient and scheduled AWV for 09/26/2022 with Kenard Gower @ 1000am

## 2022-09-26 ENCOUNTER — Encounter: Payer: Medicare HMO | Admitting: Adult Health

## 2022-09-26 ENCOUNTER — Encounter: Payer: Self-pay | Admitting: Adult Health

## 2022-09-26 DIAGNOSIS — R1311 Dysphagia, oral phase: Secondary | ICD-10-CM | POA: Diagnosis not present

## 2022-09-26 DIAGNOSIS — I5043 Acute on chronic combined systolic (congestive) and diastolic (congestive) heart failure: Secondary | ICD-10-CM | POA: Diagnosis not present

## 2022-09-26 DIAGNOSIS — M6281 Muscle weakness (generalized): Secondary | ICD-10-CM | POA: Diagnosis not present

## 2022-09-26 NOTE — Progress Notes (Signed)
This service is provided via telemedicine  No vital signs collected/recorded due to the encounter was a telemedicine visit.   Location of patient (ex: home, work):  Home   Patient consents to a telephone visit:  Yes, 06/01/2020   Location of the provider (ex: office, home):  [facility]  Name of any referring provider: Frederica Kuster, MD   Names of all persons participating in the telemedicine service and their role in the encounter: Marrell Dicaprio B/CMA, Monina C. Grayce Sessions, NP , and patient  Time spent on call:  11 minutes

## 2022-09-29 ENCOUNTER — Ambulatory Visit (INDEPENDENT_AMBULATORY_CARE_PROVIDER_SITE_OTHER): Payer: Medicare HMO | Admitting: Adult Health

## 2022-09-29 ENCOUNTER — Encounter: Payer: Self-pay | Admitting: Adult Health

## 2022-09-29 VITALS — BP 122/88 | HR 86 | Temp 95.9°F | Resp 18 | Ht 68.0 in | Wt 188.2 lb

## 2022-09-29 DIAGNOSIS — Z0000 Encounter for general adult medical examination without abnormal findings: Secondary | ICD-10-CM

## 2022-09-29 DIAGNOSIS — I5043 Acute on chronic combined systolic (congestive) and diastolic (congestive) heart failure: Secondary | ICD-10-CM | POA: Diagnosis not present

## 2022-09-29 DIAGNOSIS — M6281 Muscle weakness (generalized): Secondary | ICD-10-CM | POA: Diagnosis not present

## 2022-09-29 DIAGNOSIS — R1311 Dysphagia, oral phase: Secondary | ICD-10-CM | POA: Diagnosis not present

## 2022-09-29 NOTE — Patient Instructions (Signed)
  [name] , Thank you for taking time to come for your Medicare Wellness Visit. I appreciate your ongoing commitment to your health goals. Please review the following plan we discussed and let me know if I can assist you in the future.   These are the goals we discussed:  Goals       <enter goal here>      Starting 05/09/16, I will maintain currant lifestyle, while working on making it better.       Exercise 3x per week (30 min per time)      Patient would like to exercise more than he does now      Exercise 3x per week (30 min per time) (pt-stated)      -  continue exercising in order to be able to walk without a walker.        This is a list of the screening recommended for you and due dates:  Health Maintenance  Topic Date Due   Complete foot exam   02/15/2021   COVID-19 Vaccine (4 - 2023-24 season) 12/13/2021   Zoster (Shingles) Vaccine (1 of 2) 10/09/2022*   Hemoglobin A1C  11/12/2022   Flu Shot  11/13/2022   Eye exam for diabetics  06/19/2023   Medicare Annual Wellness Visit  09/29/2023   Pneumonia Vaccine  Completed   HPV Vaccine  Aged Out   DTaP/Tdap/Td vaccine  Discontinued  *Topic was postponed. The date shown is not the original due date.

## 2022-09-29 NOTE — Progress Notes (Signed)
Subjective:   Kevin Schwartz is a 89 y.o. male who presents for Medicare Annual/Subsequent preventive examination.  Review of Systems     Cardiac Risk Factors include: advanced age (>59men, >68 women);diabetes mellitus;dyslipidemia;male gender     Objective:    Today's Vitals   09/29/22 1330  BP: 122/88  Pulse: 86  Resp: 18  Temp: (!) 95.9 F (35.5 C)  SpO2: 95%  Weight: 188 lb 3.2 oz (85.4 kg)  Height: 5\' 8"  (1.727 m)   Body mass index is 28.62 kg/m.     09/29/2022    1:28 PM 09/26/2022    8:38 AM 05/14/2022    2:20 PM 07/11/2021    4:17 PM 07/02/2021    9:55 AM 06/06/2021   11:27 AM 04/11/2021   10:55 AM  Advanced Directives  Does Patient Have a Medical Advance Directive? Yes Yes Yes Yes No No No  Type of Advance Directive Out of facility DNR (pink MOST or yellow form) Out of facility DNR (pink MOST or yellow form) Healthcare Power of [place];Living will Healthcare Power of [place];Living will     Does patient want to make changes to medical advance directive? No - Patient declined No - Patient declined  No - Patient declined     Copy of Healthcare Power of Attorney in Chart?    No - copy available, Physician notified     Would patient like information on creating a medical advance directive?     No - Patient declined No - Patient declined No - Patient declined  Pre-existing out of facility DNR order (yellow form or pink MOST form) Pink MOST form placed in chart (order not valid for inpatient use) Pink MOST form placed in chart (order not valid for inpatient use)         Current Medications (verified) Outpatient Encounter Medications as of 09/29/2022  Medication Sig   allopurinol (ZYLOPRIM) 100 MG tablet Take 1 tablet (100 mg total) by mouth daily.   apixaban (ELIQUIS) 2.5 MG TABS tablet Take 2.5 mg by mouth 2 (two) times daily.   atorvastatin (LIPITOR) 80 MG tablet Take 1 tablet (80 mg total) by mouth daily.   cetirizine (ZYRTEC) 10 MG tablet Take 10 mg by  mouth daily.   empagliflozin (JARDIANCE) 10 MG TABS tablet Take 1 tablet (10 mg total) by mouth daily. Patient takes 1 tablet by mouth every other day.   ezetimibe (ZETIA) 10 MG tablet Take 10 mg by mouth daily.   ferrous gluconate (FERGON) 324 MG tablet Take 324 mg by mouth 3 (three) times a week.   levothyroxine (SYNTHROID) 25 MCG tablet Take 25 mcg by mouth daily before breakfast.   Menthol, Topical Analgesic, (BIOFREEZE) 4 % GEL Apply 1 application  topically in the morning and at bedtime. To right wrist/hand   Menthol, Topical Analgesic, (EUCERIN ITCH RELIEF) 0.1 % LOTN Apply 1 application  topically daily. To rash on chest/abdomin/back/neck/shoulder   Multiple Vitamin (MULTIVITAMIN) tablet Take 1 tablet by mouth daily.   Petrolatum Hydrophilic OINT Apply 1 application  topically at bedtime. To bilateral nares   Skin Protectants, Misc. (EUCERIN) cream Apply 1 Application topically daily.   sodium chloride (OCEAN) 0.65 % SOLN nasal spray Place 1 spray into both nostrils daily.   tamsulosin (FLOMAX) 0.4 MG CAPS capsule Take 0.4 mg by mouth daily.   torsemide (DEMADEX) 20 MG tablet Take 3 tablets (60 mg total) by mouth daily.   White Petrolatum (VASELINE EX) Apply 1 application  topically at  bedtime. To bilateral nares   Zinc Oxide 25 % PSTE Apply 1 application  topically 2 (two) times daily. To bottom during incontinence care   No facility-administered encounter medications on file as of 09/29/2022.    Allergies (verified) Cozaar [losartan potassium], Zestril [lisinopril], Crestor [rosuvastatin], and Tape   History: Past Medical History:  Diagnosis Date   Anemia due to chronic kidney disease    Bilateral inguinal hernia    CAD (coronary artery disease) cardiologist-  dr bensimhon   PTCA of OM in 1998, BMS OM in 2000, Cath 9/07 LM ok LAD ok. LCX 95% in OM prior to previous stent RCA. nondominant normal EF normal. Cypher DES to OM 2007 (PLACED PROXIAMAL TO PREVIOUS STENT)   Chronic  kidney disease, stage III (moderate) (HCC)    nephrologist-  dr detarding   Depression    Diabetes mellitus type 2, diet-controlled (HCC)    followed by pcp,  last A1c 5.2 on 09-10-2017 in epic   Gout, unspecified    10-29-2017  per pt stable   HLD (hyperlipidemia)    HTN (hypertension)    MGUS (monoclonal gammopathy of unknown significance) previously followed by dr Cyndie Chime , Theron Arista and released 08/01/2011   IgG kappa dx 2002 8% plasma cells in bone marrow; no lesions on bone X-rays;   Nocturia    OA (osteoarthritis)    OSA on CPAP    per study 01-30-2004  severe osa , AHI 51.6/hr   PAD (peripheral artery disease) (HCC)    05-21-2006  left renal artery stenosis, s/p balloon angioplasty and stenting;  last duplex 02/ 2012  normal , arteries patent   S/P coronary artery stent placement    2000--  BMS x1  to OM;   2007-- DES x1  to OM proximal to previous stent   Secondary hyperparathyroidism of renal origin [facility])    Urgency of urination    Wears glasses    Past Surgical History:  Procedure Laterality Date   CARDIOVASCULAR STRESS TEST  2010   per dr bensimhon epic note dated 04-18-2016  normal   CORONARY ANGIOPLASTY  1998   PTCA to OM   CORONARY ANGIOPLASTY WITH STENT PLACEMENT  2000   PCI and BMS x1 OM   CORONARY ANGIOPLASTY WITH STENT PLACEMENT  12-18-2005  dr Tresa Endo   DES x1 to proximal to previously placed stent in OM   CORONARY BALLOON ANGIOPLASTY N/A 08/02/2020   Procedure: CORONARY BALLOON ANGIOPLASTY;  Surgeon: Lennette Bihari, MD;  [facility] INVAS[facility] CV LAB;  Service: Cardiovascular;  Laterality: N/A;   CORONARY STENT INTERVENTION N/A 08/02/2020   Procedure: CORONARY STENT INTERVENTION;  Surgeon: Lennette Bihari, MD;  Location: [facility] INVASIVE CV LAB;  Service: Cardiovascular;  Laterality: N/A;   HERNIA REPAIR     INGUINAL HERNIA REPAIR Bilateral 10/30/2017   Procedure: LAPAROSCOPIC  BILATERAL INGUINAL HERNIA REPAIR  AND LEFT FEMERAL HERNIA REPAIR;  Surgeon: Karie Soda,  MD;  Location: [facility];  Service: General;  Laterality: Bilateral;   INSERTION OF MESH Bilateral 10/30/2017   Procedure: INSERTION OF MESH;  Surgeon: Karie Soda, MD;  Location: [facility];  Service: General;  Laterality: Bilateral;   LEFT HEART CATH AND CORONARY ANGIOGRAPHY N/A 08/01/2020   Procedure: LEFT HEART CATH AND CORONARY ANGIOGRAPHY;  Surgeon: Lennette Bihari, MD;  Location: [facility] INVASIVE CV LAB;  Service: Cardiovascular;  Laterality: N/A;   RENAL ANGIOPLASTY Left 05-21-2006   dr berry   and stenting   TOTAL  KNEE ARTHROPLASTY Left 07-22-2005   dr Simonne Come  [facility]   TRANSTHORACIC ECHOCARDIOGRAM  04/18/2016   ef 50-55%, grade 1 diastolic dysfunction/  mild to moderate AR/  mild MR   Family History  Problem Relation Age of Onset   Heart disease Mother    Healthy Father    Heart disease Sister    Heart disease Sister    Cancer Sister        type unknown   Hypertension Sister    Diabetes Brother    Diabetes Brother    Diabetes Other    Coronary artery disease Other    Cancer Other    Social History   Socioeconomic History   Marital status: Widowed    Spouse name: Not on file   Number of children: 1   Years of education: Not on file   Highest education level: Not on file  Occupational History   Occupation: retired   Tobacco Use   Smoking status: Some Days    Packs/day: .25    Types: Cigarettes   Smokeless tobacco: Never   Tobacco comments:    10-29-2017  per pt was smoking 1pp2d, stopped June 2019  Vaping Use   Vaping Use: Never used  Substance and Sexual Activity   Alcohol use: Not Currently   Drug use: No   Sexual activity: Not on file  Other Topics Concern   Not on file  Social History Narrative   Widowed 02/2012   Lives along   Stopped smoking 2004   Alcohol none   Exercise works in Administrator, [employer]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    Social Determinants of Health   Financial Resource Strain: Low Risk  (05/25/2017)   Overall Financial  Resource Strain (CARDIA)    Difficulty of Paying Living Expenses: Not hard at all  Food Insecurity: No Food Insecurity (05/25/2017)   Hunger Vital Sign    Worried About Running Out of Food in the Last Year: Never true    Ran Out of Food in the Last Year: Never true  Transportation Needs: No Transportation Needs (05/25/2017)   PRAPARE - Administrator, Civil Service (Medical): No    Lack of Transportation (Non-Medical): No  Physical Activity: Insufficiently Active (05/25/2017)   Exercise Vital Sign    Days of Exercise per Week: 3 days    Minutes of Exercise per Session: 20 min  Stress: Stress Concern Present (05/25/2017)   [employer] of Occupational Health - Occupational Stress Questionnaire    Feeling of Stress : Very much  Social Connections: Somewhat Isolated (05/25/2017)   Social Connection and Isolation Panel [NHANES]    Frequency of Communication with Friends and Family: More than three times a week    Frequency of Social Gatherings with Friends and Family: More than three times a week    Attends Religious Services: More than 4 times per year    Active Member of [employer] or Organizations: No    Attends Banker Meetings: Never    Marital Status: Widowed    Tobacco Counseling Ready to quit: Not Answered Counseling given: Not Answered Tobacco comments: 10-29-2017  per pt was smoking 1pp2d, stopped June 2019   Clinical Intake:                 Diabetic? Yes         Activities of Daily Living    09/29/2022    1:41 PM  In your present state of health, do you  have any difficulty performing the following activities:  Hearing? 1  Comment wears hearing aids, bilateral  Vision? 0  Difficulty concentrating or making decisions? 1  Walking or climbing stairs? 1  Dressing or bathing? 1  Doing errands, shopping? 1  Preparing Food and eating ? N  Using the Toilet? N  In the past six months, have you accidently leaked urine? Y  Do you have  problems with loss of bowel control? N  Managing your Medications? Y  Managing your Finances? Y  Housekeeping or managing your Housekeeping? Y    Patient Care Team: Frederica Kuster, MD as PCP - General (Family Medicine) Bensimhon, Bevelyn Buckles, MD as PCP - Cardiology (Cardiology) Bensimhon, Bevelyn Buckles, MD as Consulting Physician (Cardiology) Penninger, Lillia Abed, [place] as Physician Assistant (Nephrology) Terrial Rhodes, MD as Consulting Physician (Nephrology) Karie Soda, MD as Consulting Physician (General Surgery) Loletha Carrow, MD as Consulting Physician (Ophthalmology)  Indicate any recent Medical Services you may have received from other than [facility] providers in the past year (date may be approximate).     Assessment:   This is a routine wellness examination for [place].  Hearing/Vision screen No results found.  Dietary issues and exercise activities discussed: Current Exercise Habits: Home exercise routine;Structured exercise class, Type of exercise: Other - see comments (bicycle), Time (Minutes): 30, Frequency (Times/Week): 5, Weekly Exercise (Minutes/Week): 150, Intensity: Moderate, Exercise limited by: cardiac condition(s)   Goals Addressed               This Visit's Progress     Exercise 3x per week (30 min per time) (pt-stated)        -  continue exercising in order to be able to walk without a walker.       Depression Screen    09/29/2022    1:44 PM 09/26/2022    9:45 AM 07/09/2022    9:17 AM 06/06/2021   11:22 AM 02/26/2021    9:27 AM 06/01/2020   11:04 AM 09/05/2019   12:03 PM  PHQ 2/9 Scores  PHQ - 2 Score 0 0 0 0 0 0 0  PHQ- 9 Score 0          Fall Risk    09/29/2022    1:44 PM 09/26/2022    9:45 AM 07/09/2022    9:17 AM 06/06/2021   11:21 AM 04/11/2021   10:55 AM  Fall Risk   Falls in the past year? 0 0 0 0 0  Number falls in past yr: 0 0 0 0 0  Injury with Fall? 0 0 0 0 0  Risk for fall due to : No Fall Risks No Fall Risks No Fall Risks No  Fall Risks   Follow up Falls evaluation completed Falls evaluation completed  Falls evaluation completed Falls evaluation completed    FALL RISK PREVENTION PERTAINING TO THE HOME:  Any stairs in or around the home? No  If so, are there any without handrails? No  Home free of loose throw rugs in walkways, pet beds, electrical cords, etc? Yes  Adequate lighting in your home to reduce risk of falls? Yes   ASSISTIVE DEVICES UTILIZED TO PREVENT FALLS:  Life alert? Yes  Use of a cane, walker or w/c? Yes  Grab bars in the bathroom? Yes  Shower chair or bench in shower? Yes  Elevated toilet seat or a handicapped toilet? Yes   TIMED UP AND GO:  Was the test performed? No .  Length of time to ambulate  10 feet: N/A sec.   Gait unsteady with use of assistive device, provider informed and education provided.   Cognitive Function:    09/29/2022    1:45 PM 05/26/2018   10:34 AM 05/25/2017   10:15 AM 05/09/2016    4:01 PM 04/26/2015    2:26 PM  MMSE - Mini Mental State Exam  Orientation to time 5 5 5 5 5   Orientation to Place 5 5 5 5 5   Registration 3 3 3 3 3   Attention/ Calculation 0 2 4 3 4   Recall 3 3 1 3 3   Language- name 2 objects 2 2 2 2 2   Language- repeat 1 1 1 1 1   Language- follow 3 step command 3 3 3 3 3   Language- read & follow direction 1 1 1 1 1   Write a sentence 1 1 1 1 1   Copy design 1 1 1 1 1   Total score 25 27 27 28 29         09/26/2022    9:46 AM 06/06/2021   11:23 AM 06/01/2020   11:05 AM 05/31/2019   10:25 AM  6CIT Screen  What Year? 0 points 0 points 0 points 0 points  What month? 0 points 0 points 0 points 0 points  What time? 0 points 0 points 0 points 0 points  Count back from 20 0 points 4 points 4 points 0 points  Months in reverse 2 points 2 points 4 points 4 points  Repeat phrase 0 points 0 points 8 points 10 points  Total Score 2 points 6 points 16 points 14 points    Immunizations Immunization History  Administered Date(s) Administered   Fluad  Quad(high Dose 65+) 01/06/2019, 02/16/2020, 02/26/2021   Influenza, High Dose Seasonal PF 01/08/2017, 01/25/2018   Influenza,inj,Quad PF,6+ Mos 12/27/2012, 03/06/2014, 04/02/2015, 11/30/2015   PFIZER(Purple Top)SARS-COV-2 Vaccination 06/16/2019, 07/08/2019, 02/16/2020   Pneumococcal Conjugate-13 09/22/2014   Pneumococcal Polysaccharide-23 12/27/2012   Tdap 04/15/2011    TDAP status: Due, Education has been provided regarding the importance of this vaccine. Advised may receive this vaccine at local pharmacy or Health Dept. Aware to provide a copy of the vaccination record if obtained from local pharmacy or Health Dept. Verbalized acceptance and understanding.  Flu Vaccine status: Up to date  Pneumococcal vaccine status: Up to date  Covid-19 vaccine status: Completed vaccines  Qualifies for Shingles Vaccine? Yes   Zostavax completed Yes   Shingrix Completed?: Yes  Screening Tests Health Maintenance  Topic Date Due   FOOT EXAM  02/15/2021   COVID-19 Vaccine (4 - 2023-24 season) 12/13/2021   Zoster Vaccines- Shingrix (1 of 2) 10/09/2022 (Originally 12/16/1949)   HEMOGLOBIN A1C  11/12/2022   INFLUENZA VACCINE  11/13/2022   OPHTHALMOLOGY EXAM  06/19/2023   Medicare Annual Wellness (AWV)  09/29/2023   Pneumonia Vaccine 34+ Years old  Completed   HPV VACCINES  Aged Out   DTaP/Tdap/Td  Discontinued    Health Maintenance  Health Maintenance Due  Topic Date Due   FOOT EXAM  02/15/2021   COVID-19 Vaccine (4 - 2023-24 season) 12/13/2021    Colorectal cancer screening: No longer required.   Lung Cancer Screening: (Low Dose CT Chest recommended if Age 46-80 years, 30 pack-year currently smoking OR have quit w/in 15years.) does not qualify.   Lung Cancer Screening Referral: No  Additional Screening:  Hepatitis C Screening: does not qualify; Completed No  Vision Screening: Recommended annual ophthalmology exams for early detection of glaucoma and other disorders of  the eye. Is the  patient up to date with their annual eye exam?  Yes  Who is the provider or what is the name of the office in which the patient attends annual eye exams? Goes to [facility] If pt is not established with a provider, would they like to be referred to a provider to establish care? No .   Dental Screening: Recommended annual dental exams for proper oral hygiene  Community Resource Referral / Chronic Care Management: CRR required this visit?  No   CCM required this visit?  No      Plan:     I have personally reviewed and noted the following in the patient's chart:   Medical and social history Use of alcohol, tobacco or illicit drugs  Current medications and supplements including opioid prescriptions. Patient is not currently taking opioid prescriptions. Functional ability and status Nutritional status Physical activity Advanced directives List of other physicians Hospitalizations, surgeries, and ER visits in previous 12 months Vitals Screenings to include cognitive, depression, and falls Referrals and appointments  In addition, I have reviewed and discussed with patient certain preventive protocols, quality metrics, and best practice recommendations. A written personalized care plan for preventive services as well as general preventive health recommendations were provided to patient.     Makani Seckman Medina-Vargas, NP   09/29/2022   Nurse Notes: Needs Tdap.

## 2022-09-30 DIAGNOSIS — M6281 Muscle weakness (generalized): Secondary | ICD-10-CM | POA: Diagnosis not present

## 2022-09-30 DIAGNOSIS — R1311 Dysphagia, oral phase: Secondary | ICD-10-CM | POA: Diagnosis not present

## 2022-09-30 DIAGNOSIS — I5043 Acute on chronic combined systolic (congestive) and diastolic (congestive) heart failure: Secondary | ICD-10-CM | POA: Diagnosis not present

## 2022-10-01 DIAGNOSIS — M6281 Muscle weakness (generalized): Secondary | ICD-10-CM | POA: Diagnosis not present

## 2022-10-01 DIAGNOSIS — R1311 Dysphagia, oral phase: Secondary | ICD-10-CM | POA: Diagnosis not present

## 2022-10-01 DIAGNOSIS — I5043 Acute on chronic combined systolic (congestive) and diastolic (congestive) heart failure: Secondary | ICD-10-CM | POA: Diagnosis not present

## 2022-10-02 DIAGNOSIS — R1311 Dysphagia, oral phase: Secondary | ICD-10-CM | POA: Diagnosis not present

## 2022-10-02 DIAGNOSIS — M6281 Muscle weakness (generalized): Secondary | ICD-10-CM | POA: Diagnosis not present

## 2022-10-02 DIAGNOSIS — I5043 Acute on chronic combined systolic (congestive) and diastolic (congestive) heart failure: Secondary | ICD-10-CM | POA: Diagnosis not present

## 2022-10-03 DIAGNOSIS — R1311 Dysphagia, oral phase: Secondary | ICD-10-CM | POA: Diagnosis not present

## 2022-10-03 DIAGNOSIS — I5043 Acute on chronic combined systolic (congestive) and diastolic (congestive) heart failure: Secondary | ICD-10-CM | POA: Diagnosis not present

## 2022-10-03 DIAGNOSIS — M6281 Muscle weakness (generalized): Secondary | ICD-10-CM | POA: Diagnosis not present

## 2022-10-03 NOTE — Progress Notes (Signed)
This encounter was created in error - please disregard.

## 2022-10-06 DIAGNOSIS — I5043 Acute on chronic combined systolic (congestive) and diastolic (congestive) heart failure: Secondary | ICD-10-CM | POA: Diagnosis not present

## 2022-10-06 DIAGNOSIS — R1311 Dysphagia, oral phase: Secondary | ICD-10-CM | POA: Diagnosis not present

## 2022-10-06 DIAGNOSIS — M6281 Muscle weakness (generalized): Secondary | ICD-10-CM | POA: Diagnosis not present

## 2022-10-07 DIAGNOSIS — R1311 Dysphagia, oral phase: Secondary | ICD-10-CM | POA: Diagnosis not present

## 2022-10-07 DIAGNOSIS — I5043 Acute on chronic combined systolic (congestive) and diastolic (congestive) heart failure: Secondary | ICD-10-CM | POA: Diagnosis not present

## 2022-10-07 DIAGNOSIS — M6281 Muscle weakness (generalized): Secondary | ICD-10-CM | POA: Diagnosis not present

## 2022-10-08 DIAGNOSIS — N184 Chronic kidney disease, stage 4 (severe): Secondary | ICD-10-CM | POA: Diagnosis not present

## 2022-10-08 DIAGNOSIS — I272 Pulmonary hypertension, unspecified: Secondary | ICD-10-CM | POA: Diagnosis not present

## 2022-10-08 DIAGNOSIS — E1122 Type 2 diabetes mellitus with diabetic chronic kidney disease: Secondary | ICD-10-CM | POA: Diagnosis not present

## 2022-10-08 DIAGNOSIS — R1311 Dysphagia, oral phase: Secondary | ICD-10-CM | POA: Diagnosis not present

## 2022-10-08 DIAGNOSIS — R635 Abnormal weight gain: Secondary | ICD-10-CM | POA: Diagnosis not present

## 2022-10-08 DIAGNOSIS — I5043 Acute on chronic combined systolic (congestive) and diastolic (congestive) heart failure: Secondary | ICD-10-CM | POA: Diagnosis not present

## 2022-10-08 DIAGNOSIS — I5042 Chronic combined systolic (congestive) and diastolic (congestive) heart failure: Secondary | ICD-10-CM | POA: Diagnosis not present

## 2022-10-08 DIAGNOSIS — E039 Hypothyroidism, unspecified: Secondary | ICD-10-CM | POA: Diagnosis not present

## 2022-10-08 DIAGNOSIS — I4892 Unspecified atrial flutter: Secondary | ICD-10-CM | POA: Diagnosis not present

## 2022-10-08 DIAGNOSIS — M6281 Muscle weakness (generalized): Secondary | ICD-10-CM | POA: Diagnosis not present

## 2022-10-08 DIAGNOSIS — I2510 Atherosclerotic heart disease of native coronary artery without angina pectoris: Secondary | ICD-10-CM | POA: Diagnosis not present

## 2022-10-09 DIAGNOSIS — R1311 Dysphagia, oral phase: Secondary | ICD-10-CM | POA: Diagnosis not present

## 2022-10-09 DIAGNOSIS — I5043 Acute on chronic combined systolic (congestive) and diastolic (congestive) heart failure: Secondary | ICD-10-CM | POA: Diagnosis not present

## 2022-10-09 DIAGNOSIS — M6281 Muscle weakness (generalized): Secondary | ICD-10-CM | POA: Diagnosis not present

## 2022-10-10 DIAGNOSIS — M6281 Muscle weakness (generalized): Secondary | ICD-10-CM | POA: Diagnosis not present

## 2022-10-10 DIAGNOSIS — I5043 Acute on chronic combined systolic (congestive) and diastolic (congestive) heart failure: Secondary | ICD-10-CM | POA: Diagnosis not present

## 2022-10-10 DIAGNOSIS — R1311 Dysphagia, oral phase: Secondary | ICD-10-CM | POA: Diagnosis not present

## 2022-10-12 DIAGNOSIS — R1311 Dysphagia, oral phase: Secondary | ICD-10-CM | POA: Diagnosis not present

## 2022-10-12 DIAGNOSIS — I5043 Acute on chronic combined systolic (congestive) and diastolic (congestive) heart failure: Secondary | ICD-10-CM | POA: Diagnosis not present

## 2022-10-12 DIAGNOSIS — M6281 Muscle weakness (generalized): Secondary | ICD-10-CM | POA: Diagnosis not present

## 2022-10-14 DIAGNOSIS — I5043 Acute on chronic combined systolic (congestive) and diastolic (congestive) heart failure: Secondary | ICD-10-CM | POA: Diagnosis not present

## 2022-10-14 DIAGNOSIS — R1311 Dysphagia, oral phase: Secondary | ICD-10-CM | POA: Diagnosis not present

## 2022-10-14 DIAGNOSIS — M6281 Muscle weakness (generalized): Secondary | ICD-10-CM | POA: Diagnosis not present

## 2022-10-15 DIAGNOSIS — L603 Nail dystrophy: Secondary | ICD-10-CM | POA: Diagnosis not present

## 2022-10-15 DIAGNOSIS — E1151 Type 2 diabetes mellitus with diabetic peripheral angiopathy without gangrene: Secondary | ICD-10-CM | POA: Diagnosis not present

## 2022-10-17 DIAGNOSIS — R1311 Dysphagia, oral phase: Secondary | ICD-10-CM | POA: Diagnosis not present

## 2022-10-17 DIAGNOSIS — M6281 Muscle weakness (generalized): Secondary | ICD-10-CM | POA: Diagnosis not present

## 2022-10-17 DIAGNOSIS — I5043 Acute on chronic combined systolic (congestive) and diastolic (congestive) heart failure: Secondary | ICD-10-CM | POA: Diagnosis not present

## 2022-10-20 DIAGNOSIS — I5043 Acute on chronic combined systolic (congestive) and diastolic (congestive) heart failure: Secondary | ICD-10-CM | POA: Diagnosis not present

## 2022-10-20 DIAGNOSIS — R1311 Dysphagia, oral phase: Secondary | ICD-10-CM | POA: Diagnosis not present

## 2022-10-20 DIAGNOSIS — M6281 Muscle weakness (generalized): Secondary | ICD-10-CM | POA: Diagnosis not present

## 2022-10-22 DIAGNOSIS — R1311 Dysphagia, oral phase: Secondary | ICD-10-CM | POA: Diagnosis not present

## 2022-10-22 DIAGNOSIS — M6281 Muscle weakness (generalized): Secondary | ICD-10-CM | POA: Diagnosis not present

## 2022-10-22 DIAGNOSIS — I5043 Acute on chronic combined systolic (congestive) and diastolic (congestive) heart failure: Secondary | ICD-10-CM | POA: Diagnosis not present

## 2022-10-23 DIAGNOSIS — N183 Chronic kidney disease, stage 3 unspecified: Secondary | ICD-10-CM | POA: Diagnosis not present

## 2022-10-23 DIAGNOSIS — I2510 Atherosclerotic heart disease of native coronary artery without angina pectoris: Secondary | ICD-10-CM | POA: Diagnosis not present

## 2022-10-23 DIAGNOSIS — I5042 Chronic combined systolic (congestive) and diastolic (congestive) heart failure: Secondary | ICD-10-CM | POA: Diagnosis not present

## 2022-10-23 DIAGNOSIS — Z7984 Long term (current) use of oral hypoglycemic drugs: Secondary | ICD-10-CM | POA: Diagnosis not present

## 2022-10-23 DIAGNOSIS — E1122 Type 2 diabetes mellitus with diabetic chronic kidney disease: Secondary | ICD-10-CM | POA: Diagnosis not present

## 2022-10-23 DIAGNOSIS — M109 Gout, unspecified: Secondary | ICD-10-CM | POA: Diagnosis not present

## 2022-10-23 DIAGNOSIS — E039 Hypothyroidism, unspecified: Secondary | ICD-10-CM | POA: Diagnosis not present

## 2022-10-23 DIAGNOSIS — E1151 Type 2 diabetes mellitus with diabetic peripheral angiopathy without gangrene: Secondary | ICD-10-CM | POA: Diagnosis not present

## 2022-10-23 DIAGNOSIS — I110 Hypertensive heart disease with heart failure: Secondary | ICD-10-CM | POA: Diagnosis not present

## 2022-10-24 DIAGNOSIS — I5043 Acute on chronic combined systolic (congestive) and diastolic (congestive) heart failure: Secondary | ICD-10-CM | POA: Diagnosis not present

## 2022-10-24 DIAGNOSIS — R1311 Dysphagia, oral phase: Secondary | ICD-10-CM | POA: Diagnosis not present

## 2022-10-24 DIAGNOSIS — M6281 Muscle weakness (generalized): Secondary | ICD-10-CM | POA: Diagnosis not present

## 2022-10-27 DIAGNOSIS — M6281 Muscle weakness (generalized): Secondary | ICD-10-CM | POA: Diagnosis not present

## 2022-10-27 DIAGNOSIS — R1311 Dysphagia, oral phase: Secondary | ICD-10-CM | POA: Diagnosis not present

## 2022-10-27 DIAGNOSIS — I5043 Acute on chronic combined systolic (congestive) and diastolic (congestive) heart failure: Secondary | ICD-10-CM | POA: Diagnosis not present

## 2022-10-29 DIAGNOSIS — M6281 Muscle weakness (generalized): Secondary | ICD-10-CM | POA: Diagnosis not present

## 2022-10-29 DIAGNOSIS — R1311 Dysphagia, oral phase: Secondary | ICD-10-CM | POA: Diagnosis not present

## 2022-10-29 DIAGNOSIS — I5043 Acute on chronic combined systolic (congestive) and diastolic (congestive) heart failure: Secondary | ICD-10-CM | POA: Diagnosis not present

## 2022-10-30 DIAGNOSIS — R0989 Other specified symptoms and signs involving the circulatory and respiratory systems: Secondary | ICD-10-CM | POA: Diagnosis not present

## 2022-10-30 DIAGNOSIS — R9431 Abnormal electrocardiogram [ECG] [EKG]: Secondary | ICD-10-CM | POA: Diagnosis not present

## 2022-10-30 DIAGNOSIS — R079 Chest pain, unspecified: Secondary | ICD-10-CM | POA: Diagnosis not present

## 2022-10-30 DIAGNOSIS — R062 Wheezing: Secondary | ICD-10-CM | POA: Diagnosis not present

## 2022-10-31 DIAGNOSIS — I130 Hypertensive heart and chronic kidney disease with heart failure and stage 1 through stage 4 chronic kidney disease, or unspecified chronic kidney disease: Secondary | ICD-10-CM | POA: Diagnosis not present

## 2022-10-31 DIAGNOSIS — N184 Chronic kidney disease, stage 4 (severe): Secondary | ICD-10-CM | POA: Diagnosis not present

## 2022-10-31 DIAGNOSIS — I4892 Unspecified atrial flutter: Secondary | ICD-10-CM | POA: Diagnosis not present

## 2022-10-31 DIAGNOSIS — R1311 Dysphagia, oral phase: Secondary | ICD-10-CM | POA: Diagnosis not present

## 2022-10-31 DIAGNOSIS — I252 Old myocardial infarction: Secondary | ICD-10-CM | POA: Diagnosis not present

## 2022-10-31 DIAGNOSIS — I5042 Chronic combined systolic (congestive) and diastolic (congestive) heart failure: Secondary | ICD-10-CM | POA: Diagnosis not present

## 2022-10-31 DIAGNOSIS — I340 Nonrheumatic mitral (valve) insufficiency: Secondary | ICD-10-CM | POA: Diagnosis not present

## 2022-10-31 DIAGNOSIS — D631 Anemia in chronic kidney disease: Secondary | ICD-10-CM | POA: Diagnosis not present

## 2022-10-31 DIAGNOSIS — I272 Pulmonary hypertension, unspecified: Secondary | ICD-10-CM | POA: Diagnosis not present

## 2022-10-31 DIAGNOSIS — J9610 Chronic respiratory failure, unspecified whether with hypoxia or hypercapnia: Secondary | ICD-10-CM | POA: Diagnosis not present

## 2022-10-31 DIAGNOSIS — I5043 Acute on chronic combined systolic (congestive) and diastolic (congestive) heart failure: Secondary | ICD-10-CM | POA: Diagnosis not present

## 2022-10-31 DIAGNOSIS — I25118 Atherosclerotic heart disease of native coronary artery with other forms of angina pectoris: Secondary | ICD-10-CM | POA: Diagnosis not present

## 2022-10-31 DIAGNOSIS — M6281 Muscle weakness (generalized): Secondary | ICD-10-CM | POA: Diagnosis not present

## 2022-11-03 DIAGNOSIS — I5043 Acute on chronic combined systolic (congestive) and diastolic (congestive) heart failure: Secondary | ICD-10-CM | POA: Diagnosis not present

## 2022-11-03 DIAGNOSIS — M6281 Muscle weakness (generalized): Secondary | ICD-10-CM | POA: Diagnosis not present

## 2022-11-03 DIAGNOSIS — R1311 Dysphagia, oral phase: Secondary | ICD-10-CM | POA: Diagnosis not present

## 2022-11-05 DIAGNOSIS — M6281 Muscle weakness (generalized): Secondary | ICD-10-CM | POA: Diagnosis not present

## 2022-11-05 DIAGNOSIS — I5043 Acute on chronic combined systolic (congestive) and diastolic (congestive) heart failure: Secondary | ICD-10-CM | POA: Diagnosis not present

## 2022-11-05 DIAGNOSIS — R1311 Dysphagia, oral phase: Secondary | ICD-10-CM | POA: Diagnosis not present

## 2022-11-09 DIAGNOSIS — I5043 Acute on chronic combined systolic (congestive) and diastolic (congestive) heart failure: Secondary | ICD-10-CM | POA: Diagnosis not present

## 2022-11-09 DIAGNOSIS — R1311 Dysphagia, oral phase: Secondary | ICD-10-CM | POA: Diagnosis not present

## 2022-11-09 DIAGNOSIS — M6281 Muscle weakness (generalized): Secondary | ICD-10-CM | POA: Diagnosis not present

## 2022-11-10 DIAGNOSIS — M6281 Muscle weakness (generalized): Secondary | ICD-10-CM | POA: Diagnosis not present

## 2022-11-10 DIAGNOSIS — R1311 Dysphagia, oral phase: Secondary | ICD-10-CM | POA: Diagnosis not present

## 2022-11-10 DIAGNOSIS — I5043 Acute on chronic combined systolic (congestive) and diastolic (congestive) heart failure: Secondary | ICD-10-CM | POA: Diagnosis not present

## 2022-11-10 NOTE — Progress Notes (Signed)
Advanced Heart Failure Clinic Note  ZOX:WRUEAV, Bertram Millard, MD HF Cardioligst: Dr. Gala Romney  HPI: Mr. Guild is an 89 y.o.male with  DM2, HTN, HL, mild renal insufficiency, RAS s/p stenting of L renal artery in 2008, CAD s/p multiple PCIs.  Admitted 4/22 with left shoulder pain -> NSTEMI. Echo EF 45-50%, HK of basal mid anteroseptal wall. Cath  multivessel CAD with 85% proximal RCA, 85% mid RCA, 90% distal RCA, 80% mid LAD, 80% OM 2 lesion. Staged PCI with DES to the mid LAD, balloon angioplasty of left circumflex lesion, the diffusely diseased RCA was treated medically.  4/22 Zio - Sinus with 1AVB occasional Wenckebach and transient junctional rhythm   Admitted 1/23 with COVID. Echo EF 45 - 50% global HK. RV normal.  Mod-sev MR (previously mild).   Admitted 3/23 with ADHF  Echo EF 45-50%, RV mildly reduced, PASP 67.4 mmHg, moderate to severe MR, severe TR, mild to moderate AI. Also with epistaxis requiring anterior nasal packing. EP consulted due to Mobitz Type 1 with 3.4 second pauses. Recommended conservative management with no nodal blocking agents..   Seen in ED 07/22/21 with fall, hit head. CT head negative. Follow up 4/23, volume up, REDs  41%. Torsemide increased to 40 bid x 2 days, then 60 mg daily.   Follow up 6/23, NYHA II-early III, volume stable on torsemide 40 mg. Qtc > 600 on ECG and SSRI stopped.  Today he returns for HF follow up. Lives at [facility]. He walks with a walker some and is not SOB with this. Uses WC for going further distances. Wears O2 at night. Says he has good days and bad days. No edema, orthopnea or PND. Tolerating meds ok.   ROS: All systems reviewed and negative except as per HPI.   Past Medical History:  Diagnosis Date   Anemia due to chronic kidney disease    Bilateral inguinal hernia    CAD (coronary artery disease) cardiologist-  dr bensimhon   PTCA of OM in 1998, BMS OM in 2000, Cath 9/07 LM ok LAD ok. LCX 95% in OM prior to previous stent  RCA. nondominant normal EF normal. Cypher DES to OM 2007 (PLACED PROXIAMAL TO PREVIOUS STENT)   Chronic kidney disease, stage III (moderate) (HCC)    nephrologist-  dr detarding   Depression    Diabetes mellitus type 2, diet-controlled (HCC)    followed by pcp,  last A1c 5.2 on 09-10-2017 in epic   Gout, unspecified    10-29-2017  per pt stable   HLD (hyperlipidemia)    HTN (hypertension)    MGUS (monoclonal gammopathy of unknown significance) previously followed by dr Cyndie Chime , Theron Arista and released 08/01/2011   IgG kappa dx 2002 8% plasma cells in bone marrow; no lesions on bone X-rays;   Nocturia    OA (osteoarthritis)    OSA on CPAP    per study 01-30-2004  severe osa , AHI 51.6/hr   PAD (peripheral artery disease) (HCC)    05-21-2006  left renal artery stenosis, s/p balloon angioplasty and stenting;  last duplex 02/ 2012  normal , arteries patent   S/P coronary artery stent placement    2000--  BMS x1  to OM;   2007-- DES x1  to OM proximal to previous stent   Secondary hyperparathyroidism of renal origin [facility])    Urgency of urination    Wears glasses     Current Outpatient Medications  Medication Sig Dispense Refill   allopurinol (ZYLOPRIM)  100 MG tablet Take 1 tablet (100 mg total) by mouth daily. 90 tablet 3   apixaban (ELIQUIS) 2.5 MG TABS tablet Take 2.5 mg by mouth 2 (two) times daily.     atorvastatin (LIPITOR) 80 MG tablet Take 1 tablet (80 mg total) by mouth daily. 90 tablet 3   cetirizine (ZYRTEC) 10 MG tablet Take 10 mg by mouth daily.     empagliflozin (JARDIANCE) 10 MG TABS tablet Take 1 tablet (10 mg total) by mouth daily. Patient takes 1 tablet by mouth every other day. 30 tablet 12   ezetimibe (ZETIA) 10 MG tablet Take 10 mg by mouth daily.     ferrous gluconate (FERGON) 324 MG tablet Take 324 mg by mouth 3 (three) times a week.     levothyroxine (SYNTHROID) 25 MCG tablet Take 25 mcg by mouth daily before breakfast.     Menthol, Topical Analgesic, (BIOFREEZE) 4 %  GEL Apply 1 application  topically in the morning and at bedtime. To right wrist/hand     Menthol, Topical Analgesic, (EUCERIN ITCH RELIEF) 0.1 % LOTN Apply 1 application  topically daily. To rash on chest/abdomin/back/neck/shoulder     Multiple Vitamin (MULTIVITAMIN) tablet Take 1 tablet by mouth daily.     Petrolatum Hydrophilic OINT Apply 1 application  topically at bedtime. To bilateral nares     Skin Protectants, Misc. (EUCERIN) cream Apply 1 Application topically daily.     sodium chloride (OCEAN) 0.65 % SOLN nasal spray Place 1 spray into both nostrils daily.     tamsulosin (FLOMAX) 0.4 MG CAPS capsule Take 0.4 mg by mouth daily.     torsemide (DEMADEX) 20 MG tablet Take 3 tablets (60 mg total) by mouth daily.  3   White Petrolatum (VASELINE EX) Apply 1 application  topically at bedtime. To bilateral nares     Zinc Oxide 25 % PSTE Apply 1 application  topically 2 (two) times daily. To bottom during incontinence care     No current facility-administered medications for this visit.   Social History   Tobacco Use   Smoking status: Some Days    Current packs/day: 0.25    Types: Cigarettes   Smokeless tobacco: Never   Tobacco comments:    10-29-2017  per pt was smoking 1pp2d, stopped June 2019  Vaping Use   Vaping status: Never Used  Substance Use Topics   Alcohol use: Not Currently   Drug use: No   Family History  Problem Relation Age of Onset   Heart disease Mother    Healthy Father    Heart disease Sister    Heart disease Sister    Cancer Sister        type unknown   Hypertension Sister    Diabetes Brother    Diabetes Brother    Diabetes Other    Coronary artery disease Other    Cancer Other    Wt Readings from Last 3 Encounters:  09/29/22 85.4 kg (188 lb 3.2 oz)  08/20/22 83.1 kg (183 lb 3.2 oz)  07/09/22 83.5 kg (184 lb)   There were no vitals taken for this visit.  PHYSICAL EXAM: General:  NAD. No resp difficulty, arrived in [facility] elderly HEENT: normal Neck:  supple. no JVD. Carotids 2+ bilat; no bruits. No lymphadenopathy or thryomegaly appreciated. Cor: PMI nondisplaced. Irregular rate & rhythm. 2/6 TR Lungs: clear Abdomen: soft, nontender, nondistended. No hepatosplenomegaly. No bruits or masses. Good bowel sounds. Extremities: no cyanosis, clubbing, rash, edema Neuro: alert & orientedx3, cranial  nerves grossly intact. moves all 4 extremities w/o difficulty. Affect pleasant  ECG Sinus rhythm with Wenckebach Personally reviewed  ASSESSMENT & PLAN:  1. Chronic Diastolic Heart Failure/Ischemic CM - Echo (4/22): EF 45-50%  - Echo (3/23): EF 45-50%, LV mildly down with global LV HK, mild LVH, RV mildly reduced, severely elevated PASP 67.4 mmHg, moderate to severe MR, severe TR, mild to moderate AI. - Stable NYHA II-III though assessment of functional class difficult due to deconditioning.  - Continue torsemide 60 mg daily. - Continue Jardiance 10 mg daily.  - No spiro w/ last SCr > 2. - No b-blocker due to HB. - No ACE/ARB/ARNi with hx angioedema on ace and arb. - Labs today   2. CAD  - s/p NSTEMI 4/22.  - Cath 4/22 with multivessel CAD with 85% proximal RCA, 85% mid RCA, 90% distal RCA, 80% mid LAD, 80% OM 2 lesion. Staged PCI with DES to the mid LAD, balloon angioplasty of left circumflex lesion, the diffusely diseased RCA was treated medically. - No s/s angina. - On DOAC - Continue statin. - Continue Jardiance.  3. Paroxysmal Atrial Flutter - Continue Eliquis 2.5 mg bid. - CHA2DS2-VASc Score = 5  - 39% burden by zio 09/2020 - Seen by EP during recent admit, not a good candidate for antiarrhythmic drugs. - Regular on exam today.  4. Mobitz 1 second degree HB in setting of NSTEMI 4/22 - No beta blocker. - No current indication for PPM. - stable on ECG today  5. HTN - Blood pressure well controlled.  - Continue current regimen. - Avoid hypotension  6. Aortic insufficiency/severe TR/moderate MR - Mild AI on echo 4/22 (stable  from 2018) - Mild to moderate AI on echo 3/23. - Severe TR on echo 3/23 - Moderate to severe MR on echo 3/23 - Not mTEER candidate currently with lack of symptoms and with frailty/debility. - Clinically stable. Follow.  7. CKD 3b - Baseline Scr ~2.0 - On SGLT2 - He is followed by [facility]. - Avoid hypotension - Labs today  8.T2DM - On SGLT2i.  - No GU symptoms.  9. Physical deconditioning - Continue PT at [facility]. - Palliative is following at facility.  Jacklynn Ganong, FNP  2:51 PM

## 2022-11-11 DIAGNOSIS — M6281 Muscle weakness (generalized): Secondary | ICD-10-CM | POA: Diagnosis not present

## 2022-11-11 DIAGNOSIS — I5043 Acute on chronic combined systolic (congestive) and diastolic (congestive) heart failure: Secondary | ICD-10-CM | POA: Diagnosis not present

## 2022-11-11 DIAGNOSIS — R1311 Dysphagia, oral phase: Secondary | ICD-10-CM | POA: Diagnosis not present

## 2022-11-12 ENCOUNTER — Encounter (HOSPITAL_COMMUNITY): Payer: Self-pay

## 2022-11-12 ENCOUNTER — Ambulatory Visit (HOSPITAL_COMMUNITY)
Admission: RE | Admit: 2022-11-12 | Discharge: 2022-11-12 | Disposition: A | Discharge status: Home / Self Care | Payer: No Typology Code available for payment source | Source: Ambulatory Visit | Attending: Family Medicine | Admitting: Family Medicine

## 2022-11-12 VITALS — BP 128/80 | HR 64 | Wt 193.4 lb

## 2022-11-12 DIAGNOSIS — Z8249 Family history of ischemic heart disease and other diseases of the circulatory system: Secondary | ICD-10-CM | POA: Insufficient documentation

## 2022-11-12 DIAGNOSIS — I441 Atrioventricular block, second degree: Secondary | ICD-10-CM | POA: Diagnosis not present

## 2022-11-12 DIAGNOSIS — I2510 Atherosclerotic heart disease of native coronary artery without angina pectoris: Secondary | ICD-10-CM | POA: Insufficient documentation

## 2022-11-12 DIAGNOSIS — N1832 Chronic kidney disease, stage 3b: Secondary | ICD-10-CM | POA: Insufficient documentation

## 2022-11-12 DIAGNOSIS — E119 Type 2 diabetes mellitus without complications: Secondary | ICD-10-CM | POA: Diagnosis not present

## 2022-11-12 DIAGNOSIS — I130 Hypertensive heart and chronic kidney disease with heart failure and stage 1 through stage 4 chronic kidney disease, or unspecified chronic kidney disease: Secondary | ICD-10-CM | POA: Insufficient documentation

## 2022-11-12 DIAGNOSIS — Z833 Family history of diabetes mellitus: Secondary | ICD-10-CM | POA: Insufficient documentation

## 2022-11-12 DIAGNOSIS — Z79899 Other long term (current) drug therapy: Secondary | ICD-10-CM | POA: Insufficient documentation

## 2022-11-12 DIAGNOSIS — Z7984 Long term (current) use of oral hypoglycemic drugs: Secondary | ICD-10-CM | POA: Insufficient documentation

## 2022-11-12 DIAGNOSIS — Z955 Presence of coronary angioplasty implant and graft: Secondary | ICD-10-CM | POA: Diagnosis not present

## 2022-11-12 DIAGNOSIS — I10 Essential (primary) hypertension: Secondary | ICD-10-CM

## 2022-11-12 DIAGNOSIS — I255 Ischemic cardiomyopathy: Secondary | ICD-10-CM | POA: Insufficient documentation

## 2022-11-12 DIAGNOSIS — I5032 Chronic diastolic (congestive) heart failure: Secondary | ICD-10-CM

## 2022-11-12 DIAGNOSIS — Z9981 Dependence on supplemental oxygen: Secondary | ICD-10-CM | POA: Diagnosis not present

## 2022-11-12 DIAGNOSIS — E1122 Type 2 diabetes mellitus with diabetic chronic kidney disease: Secondary | ICD-10-CM | POA: Insufficient documentation

## 2022-11-12 DIAGNOSIS — R5381 Other malaise: Secondary | ICD-10-CM | POA: Diagnosis not present

## 2022-11-12 DIAGNOSIS — N184 Chronic kidney disease, stage 4 (severe): Secondary | ICD-10-CM | POA: Diagnosis not present

## 2022-11-12 DIAGNOSIS — I252 Old myocardial infarction: Secondary | ICD-10-CM | POA: Diagnosis not present

## 2022-11-12 DIAGNOSIS — I25118 Atherosclerotic heart disease of native coronary artery with other forms of angina pectoris: Secondary | ICD-10-CM | POA: Diagnosis not present

## 2022-11-12 DIAGNOSIS — I38 Endocarditis, valve unspecified: Secondary | ICD-10-CM | POA: Diagnosis not present

## 2022-11-12 DIAGNOSIS — F17210 Nicotine dependence, cigarettes, uncomplicated: Secondary | ICD-10-CM | POA: Insufficient documentation

## 2022-11-12 DIAGNOSIS — I5033 Acute on chronic diastolic (congestive) heart failure: Secondary | ICD-10-CM | POA: Insufficient documentation

## 2022-11-12 DIAGNOSIS — I4892 Unspecified atrial flutter: Secondary | ICD-10-CM | POA: Diagnosis not present

## 2022-11-12 DIAGNOSIS — Z7901 Long term (current) use of anticoagulants: Secondary | ICD-10-CM | POA: Diagnosis not present

## 2022-11-12 DIAGNOSIS — I5042 Chronic combined systolic (congestive) and diastolic (congestive) heart failure: Secondary | ICD-10-CM | POA: Diagnosis not present

## 2022-11-12 DIAGNOSIS — I5043 Acute on chronic combined systolic (congestive) and diastolic (congestive) heart failure: Secondary | ICD-10-CM | POA: Diagnosis not present

## 2022-11-12 DIAGNOSIS — R1311 Dysphagia, oral phase: Secondary | ICD-10-CM | POA: Diagnosis not present

## 2022-11-12 DIAGNOSIS — I083 Combined rheumatic disorders of mitral, aortic and tricuspid valves: Secondary | ICD-10-CM | POA: Diagnosis not present

## 2022-11-12 DIAGNOSIS — N183 Chronic kidney disease, stage 3 unspecified: Secondary | ICD-10-CM

## 2022-11-12 DIAGNOSIS — M6281 Muscle weakness (generalized): Secondary | ICD-10-CM | POA: Diagnosis not present

## 2022-11-12 LAB — BASIC METABOLIC PANEL
Anion gap: 14 (ref 5–15)
BUN: 51 mg/dL — ABNORMAL HIGH (ref 8–23)
CO2: 22 mmol/L (ref 22–32)
Calcium: 9.5 mg/dL (ref 8.9–10.3)
Chloride: 103 mmol/L (ref 98–111)
Creatinine, Ser: 2.53 mg/dL — ABNORMAL HIGH (ref 0.61–1.24)
GFR, Estimated: 24 mL/min — ABNORMAL LOW (ref >60)
Glucose, Bld: 90 mg/dL (ref 70–99)
Potassium: 4 mmol/L (ref 3.5–5.1)
Sodium: 139 mmol/L (ref 135–145)

## 2022-11-12 LAB — BRAIN NATRIURETIC PEPTIDE: B Natriuretic Peptide: 469.4 pg/mL — ABNORMAL HIGH (ref 0.0–100.0)

## 2022-11-12 MED ORDER — POTASSIUM CHLORIDE CRYS ER 20 MEQ PO TBCR: 20.0000 meq | EXTENDED_RELEASE_TABLET | Freq: Every day | ORAL | 3 refills | Status: DC

## 2022-11-12 MED ORDER — TORSEMIDE 20 MG PO TABS: 100.0000 mg | ORAL_TABLET | Freq: Every day | ORAL | Status: DC

## 2022-11-12 NOTE — Patient Instructions (Signed)
Medication Changes:  INCREASE torsemide to 100 mg DAILY.   START taking potassium 20 mEq DAILY   *If you need a refill on your cardiac medications before your next appointment, please call your pharmacy*  Lab Work:  Labs done today, your results will be available in MyChart, we will contact you for abnormal readings.  Repeat blood work in 10 days.    Follow-Up in:   Your physician recommends that you schedule a follow-up appointment in: 3-4 weeks with APP.    Do the following things EVERYDAY: Weigh yourself in the morning before breakfast. Write it down and keep it in a log. Take your medicines as prescribed Eat low salt foods--Limit salt (sodium) to 2000 mg per day.  Stay as active as you can everyday Limit all fluids for the day to less than 2 liters    Need to Contact us:  If you have any questions or concerns before your next appointment please send [place] a message through [place] or call our office at [phone].    TO LEAVE A MESSAGE FOR THE NURSE SELECT OPTION 2, PLEASE LEAVE A MESSAGE INCLUDING: YOUR NAME DATE OF BIRTH CALL BACK NUMBER REASON FOR CALL**this is important as we prioritize the call backs  YOU WILL RECEIVE A CALL BACK THE SAME DAY AS LONG AS YOU CALL BEFORE 4:00 PM   At the Advanced [facility] Failure Clinic, you and your health needs are our priority. As part of our continuing mission to provide you with exceptional heart care, we have created designated Provider Care Teams. These Care Teams include your primary Cardiologist (physician) and Advanced Practice Providers (APPs- Physician Assistants and Nurse Practitioners) who all work together to provide you with the care you need, when you need it.   You may see any of the following providers on your designated Care Team at your next follow up: Dr Arvilla Meres Dr Marca Ancona Dr. Marcos Eke, NP Robbie Lis, [place] [facility], [place] Brynda Peon, NP Karle Plumber,  PharmD   Please be sure to bring in all your medications bottles to every appointment.    Thank you for choosing [facility]

## 2022-11-12 NOTE — Progress Notes (Signed)
ReDS Vest / Clip - 11/12/22 0900       ReDS Vest / Clip   Station Marker C    Ruler Value 29    ReDS Value Range Moderate volume overload    ReDS Actual Value 40

## 2022-11-13 DIAGNOSIS — R1311 Dysphagia, oral phase: Secondary | ICD-10-CM | POA: Diagnosis not present

## 2022-11-13 DIAGNOSIS — I5043 Acute on chronic combined systolic (congestive) and diastolic (congestive) heart failure: Secondary | ICD-10-CM | POA: Diagnosis not present

## 2022-11-13 DIAGNOSIS — M6281 Muscle weakness (generalized): Secondary | ICD-10-CM | POA: Diagnosis not present

## 2022-11-14 DIAGNOSIS — I5043 Acute on chronic combined systolic (congestive) and diastolic (congestive) heart failure: Secondary | ICD-10-CM | POA: Diagnosis not present

## 2022-11-14 DIAGNOSIS — M6281 Muscle weakness (generalized): Secondary | ICD-10-CM | POA: Diagnosis not present

## 2022-11-14 DIAGNOSIS — R1311 Dysphagia, oral phase: Secondary | ICD-10-CM | POA: Diagnosis not present

## 2022-11-21 ENCOUNTER — Encounter: Payer: Self-pay | Admitting: Family Medicine

## 2022-11-24 ENCOUNTER — Encounter: Payer: Self-pay | Admitting: Family Medicine

## 2022-11-25 ENCOUNTER — Encounter: Payer: Self-pay | Admitting: Family Medicine

## 2022-12-10 ENCOUNTER — Ambulatory Visit (HOSPITAL_COMMUNITY)
Admission: RE | Admit: 2022-12-10 | Discharge: 2022-12-10 | Disposition: A | Discharge status: Home / Self Care | Payer: Medicare HMO | Source: Ambulatory Visit | Attending: Family Medicine | Admitting: Family Medicine

## 2022-12-10 ENCOUNTER — Encounter (HOSPITAL_COMMUNITY): Payer: Self-pay

## 2022-12-10 VITALS — BP 120/64 | HR 79 | Wt 190.8 lb

## 2022-12-10 DIAGNOSIS — M6281 Muscle weakness (generalized): Secondary | ICD-10-CM | POA: Diagnosis not present

## 2022-12-10 DIAGNOSIS — I082 Rheumatic disorders of both aortic and tricuspid valves: Secondary | ICD-10-CM | POA: Diagnosis not present

## 2022-12-10 DIAGNOSIS — N1832 Chronic kidney disease, stage 3b: Secondary | ICD-10-CM | POA: Insufficient documentation

## 2022-12-10 DIAGNOSIS — E119 Type 2 diabetes mellitus without complications: Secondary | ICD-10-CM

## 2022-12-10 DIAGNOSIS — E1122 Type 2 diabetes mellitus with diabetic chronic kidney disease: Secondary | ICD-10-CM | POA: Diagnosis not present

## 2022-12-10 DIAGNOSIS — I2510 Atherosclerotic heart disease of native coronary artery without angina pectoris: Secondary | ICD-10-CM | POA: Insufficient documentation

## 2022-12-10 DIAGNOSIS — I5032 Chronic diastolic (congestive) heart failure: Secondary | ICD-10-CM | POA: Diagnosis not present

## 2022-12-10 DIAGNOSIS — I5043 Acute on chronic combined systolic (congestive) and diastolic (congestive) heart failure: Secondary | ICD-10-CM | POA: Diagnosis not present

## 2022-12-10 DIAGNOSIS — Z7901 Long term (current) use of anticoagulants: Secondary | ICD-10-CM | POA: Insufficient documentation

## 2022-12-10 DIAGNOSIS — I255 Ischemic cardiomyopathy: Secondary | ICD-10-CM | POA: Diagnosis not present

## 2022-12-10 DIAGNOSIS — I38 Endocarditis, valve unspecified: Secondary | ICD-10-CM

## 2022-12-10 DIAGNOSIS — R5381 Other malaise: Secondary | ICD-10-CM

## 2022-12-10 DIAGNOSIS — I4892 Unspecified atrial flutter: Secondary | ICD-10-CM | POA: Diagnosis not present

## 2022-12-10 DIAGNOSIS — I441 Atrioventricular block, second degree: Secondary | ICD-10-CM | POA: Insufficient documentation

## 2022-12-10 DIAGNOSIS — Z7984 Long term (current) use of oral hypoglycemic drugs: Secondary | ICD-10-CM | POA: Insufficient documentation

## 2022-12-10 DIAGNOSIS — I10 Essential (primary) hypertension: Secondary | ICD-10-CM

## 2022-12-10 DIAGNOSIS — Z955 Presence of coronary angioplasty implant and graft: Secondary | ICD-10-CM | POA: Diagnosis not present

## 2022-12-10 DIAGNOSIS — I252 Old myocardial infarction: Secondary | ICD-10-CM | POA: Insufficient documentation

## 2022-12-10 DIAGNOSIS — E1151 Type 2 diabetes mellitus with diabetic peripheral angiopathy without gangrene: Secondary | ICD-10-CM | POA: Insufficient documentation

## 2022-12-10 DIAGNOSIS — R1311 Dysphagia, oral phase: Secondary | ICD-10-CM | POA: Diagnosis not present

## 2022-12-10 DIAGNOSIS — N183 Chronic kidney disease, stage 3 unspecified: Secondary | ICD-10-CM | POA: Diagnosis not present

## 2022-12-10 DIAGNOSIS — I130 Hypertensive heart and chronic kidney disease with heart failure and stage 1 through stage 4 chronic kidney disease, or unspecified chronic kidney disease: Secondary | ICD-10-CM | POA: Insufficient documentation

## 2022-12-10 LAB — BASIC METABOLIC PANEL
Anion gap: 14 (ref 5–15)
BUN: 52 mg/dL — ABNORMAL HIGH (ref 8–23)
CO2: 21 mmol/L — ABNORMAL LOW (ref 22–32)
Calcium: 9.3 mg/dL (ref 8.9–10.3)
Chloride: 101 mmol/L (ref 98–111)
Creatinine, Ser: 2.82 mg/dL — ABNORMAL HIGH (ref 0.61–1.24)
GFR, Estimated: 21 mL/min — ABNORMAL LOW (ref >60)
Glucose, Bld: 121 mg/dL — ABNORMAL HIGH (ref 70–99)
Potassium: 3.8 mmol/L (ref 3.5–5.1)
Sodium: 136 mmol/L (ref 135–145)

## 2022-12-10 LAB — BRAIN NATRIURETIC PEPTIDE: B Natriuretic Peptide: 137 pg/mL — ABNORMAL HIGH (ref 0.0–100.0)

## 2022-12-10 NOTE — Progress Notes (Signed)
Advanced Heart Failure Clinic Note  ZOX:WRUEAVWUJ, Prashanthi, MD HF Cardioligst: Dr. Gala Romney  HPI: Mr. Saxon is an 89 y.o.male with  DM2, HTN, HL, mild renal insufficiency, RAS s/p stenting of L renal artery in 2008, CAD s/p multiple PCIs.  Admitted 4/22 with left shoulder pain -> NSTEMI. Echo EF 45-50%, HK of basal mid anteroseptal wall. Cath  multivessel CAD with 85% proximal RCA, 85% mid RCA, 90% distal RCA, 80% mid LAD, 80% OM 2 lesion. Staged PCI with DES to the mid LAD, balloon angioplasty of left circumflex lesion, the diffusely diseased RCA was treated medically.  4/22 Zio - Sinus with 1AVB occasional Wenckebach and transient junctional rhythm   Admitted 1/23 with COVID. Echo EF 45 - 50% global HK. RV normal.  Mod-sev MR (previously mild).   Admitted 3/23 with ADHF  Echo EF 45-50%, RV mildly reduced, PASP 67.4 mmHg, moderate to severe MR, severe TR, mild to moderate AI. Also with epistaxis requiring anterior nasal packing. EP consulted due to Mobitz Type 1 with 3.4 second pauses. Recommended conservative management with no nodal blocking agents..   Seen in ED 07/22/21 with fall, hit head. CT head negative. Follow up 4/23, volume up, REDs  41%. Torsemide increased to 40 bid x 2 days, then 60 mg daily.   Follow up 6/23, NYHA II-early III, volume stable on torsemide 40 mg. Qtc > 600 on ECG and SSRI stopped.  Follow up 5/24, doing ok. NYHA II-III and volume ok. Tolerating meds.  Acute visit 11/12/22 with increased SOB. Volume overloaded, weight up 10 lbs and REDs 40%. Torsemide increased to 100 mg daily  Today he returns for HF follow up. Overall feeling better. He is no longer SOB walking on flat ground with his walker. Denies palpitations, abnormal bleeding, CP, dizziness, edema, or PND/Orthopnea. Appetite ok. No fever or chills. Taking all medications provided by facility. Lives at [facility]. He wears 2L oxygen at night. 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  ROS: All systems reviewed and negative except as per HPI.   Past Medical History:  Diagnosis Date   Anemia due to chronic kidney disease    Bilateral inguinal hernia    CAD (coronary artery disease) cardiologist-  dr bensimhon   PTCA of OM in 1998, BMS OM in 2000, Cath 9/07 LM ok LAD ok. LCX 95% in OM prior to previous stent RCA. nondominant normal EF normal. Cypher DES to OM 2007 (PLACED PROXIAMAL TO PREVIOUS STENT)   Chronic kidney disease, stage III (moderate) (HCC)    nephrologist-  dr detarding   Depression    Diabetes mellitus type 2, diet-controlled (HCC)    followed by pcp,  last A1c 5.2 on 09-10-2017 in epic   Gout, unspecified    10-29-2017  per pt stable   HLD (hyperlipidemia)    HTN (hypertension)    MGUS (monoclonal gammopathy of unknown significance) previously followed by dr Cyndie Chime , Theron Arista and released 08/01/2011   IgG kappa dx 2002 8% plasma cells in bone marrow; no lesions on bone X-rays;   Nocturia    OA (osteoarthritis)    OSA on CPAP    per study 01-30-2004  severe osa , AHI 51.6/hr   PAD (peripheral artery disease) (HCC)    05-21-2006  left renal artery stenosis, s/p balloon angioplasty and stenting;  last duplex 02/ 2012  normal , arteries patent   S/P coronary artery stent placement    2000--  BMS x1  to OM;   2007-- DES x1  to OM proximal to previous stent   Secondary hyperparathyroidism of renal origin [facility])    Urgency of urination    Wears glasses     Current Outpatient Medications  Medication Sig Dispense Refill   allopurinol (ZYLOPRIM) 100 MG tablet Take 1 tablet (100 mg total) by mouth daily. 90 tablet 3   apixaban (ELIQUIS) 2.5 MG TABS tablet Take 2.5 mg by mouth 2 (two) times daily.     atorvastatin (LIPITOR) 80 MG tablet Take 1 tablet (80 mg total) by mouth daily. 90 tablet 3   cetirizine (ZYRTEC) 10 MG tablet Take 10 mg by mouth daily.     empagliflozin (JARDIANCE) 10 MG TABS tablet Patient takes 1 tablet by  mouth every other day.     ezetimibe (ZETIA) 10 MG tablet Take 10 mg by mouth daily.     levothyroxine (SYNTHROID) 25 MCG tablet Take 25 mcg by mouth daily before breakfast.     Menthol, Topical Analgesic, (BIOFREEZE) 4 % GEL Apply 1 application  topically in the morning and at bedtime. To right wrist/hand     Menthol, Topical Analgesic, (EUCERIN ITCH RELIEF) 0.1 % LOTN Apply 1 application  topically daily. To rash on chest/abdomin/back/neck/shoulder     Multiple Vitamin (MULTIVITAMIN) tablet Take 1 tablet by mouth daily.     Petrolatum Hydrophilic OINT Apply 1 application  topically at bedtime. To bilateral nares     potassium chloride SA (KLOR-CON M) 20 MEQ tablet Take 1 tablet (20 mEq total) by mouth daily. 90 tablet 3   Skin Protectants, Misc. (EUCERIN) cream Apply 1 Application topically daily.     sodium chloride (OCEAN) 0.65 % SOLN nasal spray Place 1 spray into both nostrils daily.     tamsulosin (FLOMAX) 0.4 MG CAPS capsule Take 0.4 mg by mouth daily.     torsemide (DEMADEX) 20 MG tablet Take 5 tablets (100 mg total) by mouth daily. Please cancel all previous orders for current medication. Change in dosage or pill size.     White Petrolatum (VASELINE EX) Apply 1 application  topically at bedtime. To bilateral nares     Zinc Oxide 25 % PSTE Apply 1 application  topically 2 (two) times daily. To bottom during incontinence care     No current facility-administered medications for this encounter.   Social History   Tobacco Use   Smoking status: Some Days    Current packs/day: 0.25    Types: Cigarettes   Smokeless tobacco: Never   Tobacco comments:    10-29-2017  per pt was smoking 1pp2d, stopped June 2019  Vaping Use   Vaping status: Never Used  Substance Use Topics   Alcohol use: Not Currently   Drug use: No   Family History  Problem Relation Age of Onset   Heart disease Mother    Healthy Father    Heart disease Sister    Heart disease Sister    Cancer Sister        type  unknown   Hypertension Sister    Diabetes Brother    Diabetes Brother    Diabetes Other    Coronary artery disease Other    Cancer Other    Wt Readings from Last 3 Encounters:  12/10/22 86.5 kg (190 lb 12.8 oz)  11/12/22 87.7 kg (193 lb 6.4 oz)  09/29/22 85.4 kg (188 lb 3.2 oz)   BP 120/64   Pulse 79   Wt 86.5 kg (190 lb 12.8 oz)  SpO2 99%   BMI 29.01 kg/m   PHYSICAL EXAM: General:  NAD. No resp difficulty, elderly, arrived in [facility] HEENT: Normal Neck: Supple. No JVD. Carotids 2+ bilat; no bruits. No lymphadenopathy or thryomegaly appreciated. Cor: PMI nondisplaced. Regular rate & rhythm. No rubs, gallops or murmurs. Lungs: Clear Abdomen: Soft, nontender, nondistended. No hepatosplenomegaly. No bruits or masses. Good bowel sounds. Extremities: No cyanosis, clubbing, rash, edema Neuro: Alert & oriented x 3, cranial nerves grossly intact. Moves all 4 extremities w/o difficulty. Affect pleasant.  ReDs: 40%-->31% today  ASSESSMENT & PLAN:  1. Chronic Diastolic Heart Failure/Ischemic CM - Echo (4/22): EF 45-50%  - Echo (3/23): EF 45-50%, LV mildly down with global LV HK, mild LVH, RV mildly reduced, severely elevated PASP 67.4 mmHg, moderate to severe MR, severe TR, mild to moderate AI. - Improved NYHA IIb, though assessment of functional class difficult due to deconditioning. Volume looks good today, ReDs improved to 31% - Continue torsemide 100 mg daily + 20 KCL daily. - Continue Jardiance 10 mg daily.  - No spiro w/ last SCr > 2. - No b-blocker due to HB. - No ACE/ARB/ARNi with hx angioedema on ace and arb. - Labs today   2. CAD  - s/p NSTEMI 4/22.  - Cath 4/22 with multivessel CAD with 85% proximal RCA, 85% mid RCA, 90% distal RCA, 80% mid LAD, 80% OM 2 lesion. Staged PCI with DES to the mid LAD, balloon angioplasty of left circumflex lesion, the diffusely diseased RCA was treated medically. - No s/s angina. - On DOAC - Continue statin. - Continue Jardiance.  3.  Paroxysmal Atrial Flutter - Continue Eliquis 2.5 mg bid. - CHA2DS2-VASc Score = 5  - Zio (6/22): 39% burden  - Seen by EP during recent admit, not a good candidate for antiarrhythmic drugs. - Regular on exam today.  4. Mobitz 1 second degree HB in setting of NSTEMI 4/22 - No beta blocker. - No current indication for PPM.  5. HTN - Blood pressure well controlled.  - Continue current regimen. - Avoid hypotension  6. Aortic insufficiency/severe TR/moderate MR - Mild AI on echo 4/22 (stable from 2018) - Mild to moderate AI on echo 3/23. - Severe TR on echo 3/23 - Moderate to severe MR on echo 3/23 - Not mTEER candidate currently with lack of symptoms and with frailty/debility. - Clinically stable. Follow.  7. CKD 3b - Baseline Scr ~2.0 - He is followed by [facility]. - On SGLT2i - Avoid hypotension - Labs today  8.T2DM - On SGLT2i.  - No GU symptoms.  9. Physical deconditioning - Continue PT at [facility]. - Palliative is following at facility.  Follow up in 3-4 months with Dr. Gala Romney, as scheduled.  Jacklynn Ganong, FNP  8:51 AM

## 2022-12-10 NOTE — Patient Instructions (Signed)
Medication Changes:  No Changes In Medications at this time.   Lab Work:  Labs done today, your results will be available in MyChart, we will contact you for abnormal readings.  Follow-Up in: IN 3-4 MONTHS. PLEASE CALL OUR OFFICE AROUND MID SEPTEMBER TO GET SCHEDULED FOR YOUR APPOINTMENT. PHONE NUMBER IS [phone] OPTION 2   At the Advanced [facility] Clinic, you and your health needs are our priority. We have a designated team specialized in the treatment of Heart Failure. This Care Team includes your primary Heart Failure Specialized Cardiologist (physician), Advanced Practice Providers (APPs- Physician Assistants and Nurse Practitioners), and Pharmacist who all work together to provide you with the care you need, when you need it.   You may see any of the following providers on your designated Care Team at your next follow up:  Dr. Arvilla Meres Dr. Marca Ancona Dr. Marcos Eke, NP Robbie Lis, [place] [facility] [place], [place] Brynda Peon, NP Karle Plumber, PharmD  Please be sure to bring in all your medications bottles to every appointment.   Need to Contact us:  If you have any questions or concerns before your next appointment please send [place] a message through [place] or call our office at [phone].    TO LEAVE A MESSAGE FOR THE NURSE SELECT OPTION 2, PLEASE LEAVE A MESSAGE INCLUDING: YOUR NAME DATE OF BIRTH CALL BACK NUMBER REASON FOR CALL**this is important as we prioritize the call backs  YOU WILL RECEIVE A CALL BACK THE SAME DAY AS LONG AS YOU CALL BEFORE 4:00 PM

## 2022-12-10 NOTE — Progress Notes (Signed)
ReDS Vest / Clip - 12/10/22 0900       ReDS Vest / Clip   Station Marker C    Ruler Value 30.5    ReDS Value Range Low volume    ReDS Actual Value 31

## 2022-12-12 DIAGNOSIS — N184 Chronic kidney disease, stage 4 (severe): Secondary | ICD-10-CM | POA: Diagnosis not present

## 2022-12-12 DIAGNOSIS — M25561 Pain in right knee: Secondary | ICD-10-CM | POA: Diagnosis not present

## 2022-12-12 DIAGNOSIS — R0989 Other specified symptoms and signs involving the circulatory and respiratory systems: Secondary | ICD-10-CM | POA: Diagnosis not present

## 2022-12-12 DIAGNOSIS — E039 Hypothyroidism, unspecified: Secondary | ICD-10-CM | POA: Diagnosis not present

## 2022-12-12 DIAGNOSIS — D631 Anemia in chronic kidney disease: Secondary | ICD-10-CM | POA: Diagnosis not present

## 2022-12-12 DIAGNOSIS — Z7901 Long term (current) use of anticoagulants: Secondary | ICD-10-CM | POA: Diagnosis not present

## 2022-12-12 DIAGNOSIS — M109 Gout, unspecified: Secondary | ICD-10-CM | POA: Diagnosis not present

## 2022-12-12 DIAGNOSIS — I5042 Chronic combined systolic (congestive) and diastolic (congestive) heart failure: Secondary | ICD-10-CM | POA: Diagnosis not present

## 2022-12-12 DIAGNOSIS — I4892 Unspecified atrial flutter: Secondary | ICD-10-CM | POA: Diagnosis not present

## 2022-12-12 DIAGNOSIS — E785 Hyperlipidemia, unspecified: Secondary | ICD-10-CM | POA: Diagnosis not present

## 2022-12-12 DIAGNOSIS — I110 Hypertensive heart disease with heart failure: Secondary | ICD-10-CM | POA: Diagnosis not present

## 2022-12-12 DIAGNOSIS — E1122 Type 2 diabetes mellitus with diabetic chronic kidney disease: Secondary | ICD-10-CM | POA: Diagnosis not present

## 2022-12-12 DIAGNOSIS — I25118 Atherosclerotic heart disease of native coronary artery with other forms of angina pectoris: Secondary | ICD-10-CM | POA: Diagnosis not present

## 2022-12-12 DIAGNOSIS — I5043 Acute on chronic combined systolic (congestive) and diastolic (congestive) heart failure: Secondary | ICD-10-CM | POA: Diagnosis not present

## 2022-12-12 DIAGNOSIS — Z7984 Long term (current) use of oral hypoglycemic drugs: Secondary | ICD-10-CM | POA: Diagnosis not present

## 2022-12-13 DIAGNOSIS — N390 Urinary tract infection, site not specified: Secondary | ICD-10-CM | POA: Diagnosis not present

## 2022-12-15 DIAGNOSIS — I5043 Acute on chronic combined systolic (congestive) and diastolic (congestive) heart failure: Secondary | ICD-10-CM | POA: Diagnosis not present

## 2022-12-15 DIAGNOSIS — R1311 Dysphagia, oral phase: Secondary | ICD-10-CM | POA: Diagnosis not present

## 2022-12-15 DIAGNOSIS — M6281 Muscle weakness (generalized): Secondary | ICD-10-CM | POA: Diagnosis not present

## 2022-12-19 DIAGNOSIS — M6281 Muscle weakness (generalized): Secondary | ICD-10-CM | POA: Diagnosis not present

## 2022-12-19 DIAGNOSIS — I5043 Acute on chronic combined systolic (congestive) and diastolic (congestive) heart failure: Secondary | ICD-10-CM | POA: Diagnosis not present

## 2022-12-19 DIAGNOSIS — R1311 Dysphagia, oral phase: Secondary | ICD-10-CM | POA: Diagnosis not present

## 2022-12-22 DIAGNOSIS — Z7901 Long term (current) use of anticoagulants: Secondary | ICD-10-CM | POA: Diagnosis not present

## 2022-12-22 DIAGNOSIS — M6281 Muscle weakness (generalized): Secondary | ICD-10-CM | POA: Diagnosis not present

## 2022-12-22 DIAGNOSIS — I5043 Acute on chronic combined systolic (congestive) and diastolic (congestive) heart failure: Secondary | ICD-10-CM | POA: Diagnosis not present

## 2022-12-22 DIAGNOSIS — I4892 Unspecified atrial flutter: Secondary | ICD-10-CM | POA: Diagnosis not present

## 2022-12-22 DIAGNOSIS — Z9189 Other specified personal risk factors, not elsewhere classified: Secondary | ICD-10-CM | POA: Diagnosis not present

## 2022-12-22 DIAGNOSIS — D631 Anemia in chronic kidney disease: Secondary | ICD-10-CM | POA: Diagnosis not present

## 2022-12-22 DIAGNOSIS — I482 Chronic atrial fibrillation, unspecified: Secondary | ICD-10-CM | POA: Diagnosis not present

## 2022-12-22 DIAGNOSIS — N184 Chronic kidney disease, stage 4 (severe): Secondary | ICD-10-CM | POA: Diagnosis not present

## 2022-12-22 DIAGNOSIS — R1311 Dysphagia, oral phase: Secondary | ICD-10-CM | POA: Diagnosis not present

## 2022-12-23 DIAGNOSIS — R1311 Dysphagia, oral phase: Secondary | ICD-10-CM | POA: Diagnosis not present

## 2022-12-23 DIAGNOSIS — I5043 Acute on chronic combined systolic (congestive) and diastolic (congestive) heart failure: Secondary | ICD-10-CM | POA: Diagnosis not present

## 2022-12-23 DIAGNOSIS — M6281 Muscle weakness (generalized): Secondary | ICD-10-CM | POA: Diagnosis not present

## 2022-12-24 DIAGNOSIS — I5043 Acute on chronic combined systolic (congestive) and diastolic (congestive) heart failure: Secondary | ICD-10-CM | POA: Diagnosis not present

## 2022-12-24 DIAGNOSIS — R1311 Dysphagia, oral phase: Secondary | ICD-10-CM | POA: Diagnosis not present

## 2022-12-24 DIAGNOSIS — M6281 Muscle weakness (generalized): Secondary | ICD-10-CM | POA: Diagnosis not present

## 2022-12-26 DIAGNOSIS — I5043 Acute on chronic combined systolic (congestive) and diastolic (congestive) heart failure: Secondary | ICD-10-CM | POA: Diagnosis not present

## 2022-12-26 DIAGNOSIS — R1311 Dysphagia, oral phase: Secondary | ICD-10-CM | POA: Diagnosis not present

## 2022-12-26 DIAGNOSIS — M6281 Muscle weakness (generalized): Secondary | ICD-10-CM | POA: Diagnosis not present

## 2022-12-28 DIAGNOSIS — M6281 Muscle weakness (generalized): Secondary | ICD-10-CM | POA: Diagnosis not present

## 2022-12-28 DIAGNOSIS — I5043 Acute on chronic combined systolic (congestive) and diastolic (congestive) heart failure: Secondary | ICD-10-CM | POA: Diagnosis not present

## 2022-12-28 DIAGNOSIS — R1311 Dysphagia, oral phase: Secondary | ICD-10-CM | POA: Diagnosis not present

## 2022-12-29 DIAGNOSIS — Z23 Encounter for immunization: Secondary | ICD-10-CM | POA: Diagnosis not present

## 2023-01-01 DIAGNOSIS — I5043 Acute on chronic combined systolic (congestive) and diastolic (congestive) heart failure: Secondary | ICD-10-CM | POA: Diagnosis not present

## 2023-01-01 DIAGNOSIS — R1311 Dysphagia, oral phase: Secondary | ICD-10-CM | POA: Diagnosis not present

## 2023-01-01 DIAGNOSIS — M6281 Muscle weakness (generalized): Secondary | ICD-10-CM | POA: Diagnosis not present

## 2023-01-02 DIAGNOSIS — I5043 Acute on chronic combined systolic (congestive) and diastolic (congestive) heart failure: Secondary | ICD-10-CM | POA: Diagnosis not present

## 2023-01-02 DIAGNOSIS — R1311 Dysphagia, oral phase: Secondary | ICD-10-CM | POA: Diagnosis not present

## 2023-01-02 DIAGNOSIS — M6281 Muscle weakness (generalized): Secondary | ICD-10-CM | POA: Diagnosis not present

## 2023-01-05 DIAGNOSIS — R1311 Dysphagia, oral phase: Secondary | ICD-10-CM | POA: Diagnosis not present

## 2023-01-05 DIAGNOSIS — I5043 Acute on chronic combined systolic (congestive) and diastolic (congestive) heart failure: Secondary | ICD-10-CM | POA: Diagnosis not present

## 2023-01-05 DIAGNOSIS — M6281 Muscle weakness (generalized): Secondary | ICD-10-CM | POA: Diagnosis not present

## 2023-01-07 DIAGNOSIS — I5043 Acute on chronic combined systolic (congestive) and diastolic (congestive) heart failure: Secondary | ICD-10-CM | POA: Diagnosis not present

## 2023-01-07 DIAGNOSIS — H40023 Open angle with borderline findings, high risk, bilateral: Secondary | ICD-10-CM | POA: Diagnosis not present

## 2023-01-07 DIAGNOSIS — M6281 Muscle weakness (generalized): Secondary | ICD-10-CM | POA: Diagnosis not present

## 2023-01-07 DIAGNOSIS — R1311 Dysphagia, oral phase: Secondary | ICD-10-CM | POA: Diagnosis not present

## 2023-01-08 DIAGNOSIS — I5043 Acute on chronic combined systolic (congestive) and diastolic (congestive) heart failure: Secondary | ICD-10-CM | POA: Diagnosis not present

## 2023-01-08 DIAGNOSIS — M6281 Muscle weakness (generalized): Secondary | ICD-10-CM | POA: Diagnosis not present

## 2023-01-08 DIAGNOSIS — R1311 Dysphagia, oral phase: Secondary | ICD-10-CM | POA: Diagnosis not present

## 2023-01-09 DIAGNOSIS — R1311 Dysphagia, oral phase: Secondary | ICD-10-CM | POA: Diagnosis not present

## 2023-01-09 DIAGNOSIS — M6281 Muscle weakness (generalized): Secondary | ICD-10-CM | POA: Diagnosis not present

## 2023-01-09 DIAGNOSIS — I5043 Acute on chronic combined systolic (congestive) and diastolic (congestive) heart failure: Secondary | ICD-10-CM | POA: Diagnosis not present

## 2023-01-12 DIAGNOSIS — M6281 Muscle weakness (generalized): Secondary | ICD-10-CM | POA: Diagnosis not present

## 2023-01-12 DIAGNOSIS — R1311 Dysphagia, oral phase: Secondary | ICD-10-CM | POA: Diagnosis not present

## 2023-01-12 DIAGNOSIS — I5043 Acute on chronic combined systolic (congestive) and diastolic (congestive) heart failure: Secondary | ICD-10-CM | POA: Diagnosis not present

## 2023-01-13 DIAGNOSIS — I5043 Acute on chronic combined systolic (congestive) and diastolic (congestive) heart failure: Secondary | ICD-10-CM | POA: Diagnosis not present

## 2023-01-13 DIAGNOSIS — M6281 Muscle weakness (generalized): Secondary | ICD-10-CM | POA: Diagnosis not present

## 2023-01-13 DIAGNOSIS — R1311 Dysphagia, oral phase: Secondary | ICD-10-CM | POA: Diagnosis not present

## 2023-01-14 DIAGNOSIS — I5043 Acute on chronic combined systolic (congestive) and diastolic (congestive) heart failure: Secondary | ICD-10-CM | POA: Diagnosis not present

## 2023-01-14 DIAGNOSIS — R1311 Dysphagia, oral phase: Secondary | ICD-10-CM | POA: Diagnosis not present

## 2023-01-14 DIAGNOSIS — M6281 Muscle weakness (generalized): Secondary | ICD-10-CM | POA: Diagnosis not present

## 2023-01-15 DIAGNOSIS — I5043 Acute on chronic combined systolic (congestive) and diastolic (congestive) heart failure: Secondary | ICD-10-CM | POA: Diagnosis not present

## 2023-01-15 DIAGNOSIS — Z23 Encounter for immunization: Secondary | ICD-10-CM | POA: Diagnosis not present

## 2023-01-15 DIAGNOSIS — R1311 Dysphagia, oral phase: Secondary | ICD-10-CM | POA: Diagnosis not present

## 2023-01-15 DIAGNOSIS — M6281 Muscle weakness (generalized): Secondary | ICD-10-CM | POA: Diagnosis not present

## 2023-01-15 DIAGNOSIS — Z7185 Encounter for immunization safety counseling: Secondary | ICD-10-CM | POA: Diagnosis not present

## 2023-01-16 DIAGNOSIS — R1311 Dysphagia, oral phase: Secondary | ICD-10-CM | POA: Diagnosis not present

## 2023-01-16 DIAGNOSIS — I5043 Acute on chronic combined systolic (congestive) and diastolic (congestive) heart failure: Secondary | ICD-10-CM | POA: Diagnosis not present

## 2023-01-16 DIAGNOSIS — M6281 Muscle weakness (generalized): Secondary | ICD-10-CM | POA: Diagnosis not present

## 2023-01-19 DIAGNOSIS — M6281 Muscle weakness (generalized): Secondary | ICD-10-CM | POA: Diagnosis not present

## 2023-01-19 DIAGNOSIS — I5043 Acute on chronic combined systolic (congestive) and diastolic (congestive) heart failure: Secondary | ICD-10-CM | POA: Diagnosis not present

## 2023-01-19 DIAGNOSIS — R1311 Dysphagia, oral phase: Secondary | ICD-10-CM | POA: Diagnosis not present

## 2023-01-20 DIAGNOSIS — I5043 Acute on chronic combined systolic (congestive) and diastolic (congestive) heart failure: Secondary | ICD-10-CM | POA: Diagnosis not present

## 2023-01-20 DIAGNOSIS — R1311 Dysphagia, oral phase: Secondary | ICD-10-CM | POA: Diagnosis not present

## 2023-01-20 DIAGNOSIS — M6281 Muscle weakness (generalized): Secondary | ICD-10-CM | POA: Diagnosis not present

## 2023-01-21 DIAGNOSIS — I5043 Acute on chronic combined systolic (congestive) and diastolic (congestive) heart failure: Secondary | ICD-10-CM | POA: Diagnosis not present

## 2023-01-21 DIAGNOSIS — R1311 Dysphagia, oral phase: Secondary | ICD-10-CM | POA: Diagnosis not present

## 2023-01-21 DIAGNOSIS — M6281 Muscle weakness (generalized): Secondary | ICD-10-CM | POA: Diagnosis not present

## 2023-01-23 DIAGNOSIS — R1311 Dysphagia, oral phase: Secondary | ICD-10-CM | POA: Diagnosis not present

## 2023-01-23 DIAGNOSIS — I5043 Acute on chronic combined systolic (congestive) and diastolic (congestive) heart failure: Secondary | ICD-10-CM | POA: Diagnosis not present

## 2023-01-23 DIAGNOSIS — M6281 Muscle weakness (generalized): Secondary | ICD-10-CM | POA: Diagnosis not present

## 2023-01-24 DIAGNOSIS — M6281 Muscle weakness (generalized): Secondary | ICD-10-CM | POA: Diagnosis not present

## 2023-01-24 DIAGNOSIS — I5043 Acute on chronic combined systolic (congestive) and diastolic (congestive) heart failure: Secondary | ICD-10-CM | POA: Diagnosis not present

## 2023-01-24 DIAGNOSIS — R1311 Dysphagia, oral phase: Secondary | ICD-10-CM | POA: Diagnosis not present

## 2023-01-28 DIAGNOSIS — M6281 Muscle weakness (generalized): Secondary | ICD-10-CM | POA: Diagnosis not present

## 2023-01-28 DIAGNOSIS — I5043 Acute on chronic combined systolic (congestive) and diastolic (congestive) heart failure: Secondary | ICD-10-CM | POA: Diagnosis not present

## 2023-01-28 DIAGNOSIS — R1311 Dysphagia, oral phase: Secondary | ICD-10-CM | POA: Diagnosis not present

## 2023-01-30 DIAGNOSIS — R1311 Dysphagia, oral phase: Secondary | ICD-10-CM | POA: Diagnosis not present

## 2023-01-30 DIAGNOSIS — I5043 Acute on chronic combined systolic (congestive) and diastolic (congestive) heart failure: Secondary | ICD-10-CM | POA: Diagnosis not present

## 2023-01-30 DIAGNOSIS — M6281 Muscle weakness (generalized): Secondary | ICD-10-CM | POA: Diagnosis not present

## 2023-01-30 NOTE — Progress Notes (Signed)
Advanced [facility] Failure Clinic Note  GNF:[id number], Prashanthi, MD HF Cardioligst: Dr. Gala Romney  HPI: Mr. Bosque is an 89 y.o.male with  DM2, HTN, HL, mild renal insufficiency, RAS s/p stenting of L renal artery in 2008, CAD s/p multiple PCIs.  Admitted 4/22 with left shoulder pain -> NSTEMI. Echo EF 45-50%, HK of basal mid anteroseptal wall. Cath  multivessel CAD with 85% proximal RCA, 85% mid RCA, 90% distal RCA, 80% mid LAD, 80% OM 2 lesion. Staged PCI with DES to the mid LAD, balloon angioplasty of left circumflex lesion, the diffusely diseased RCA was treated medically.  4/22 Zio - Sinus with 1AVB occasional Wenckebach and transient junctional rhythm   Admitted 1/23 with COVID. Echo EF 45 - 50% global HK. RV normal.  Mod-sev MR (previously mild).   Admitted 3/23 with ADHF  Echo EF 45-50%, RV mildly reduced, PASP 67.4 mmHg, moderate to severe MR, severe TR, mild to moderate AI. Also with epistaxis requiring anterior nasal packing. EP consulted due to Mobitz Type 1 with 3.4 second pauses. Recommended conservative management with no nodal blocking agents..   Seen in ED 07/22/21 with fall, hit head. CT head negative. Follow up 4/23, volume up, REDs  41%. Torsemide increased to 40 bid x 2 days, then 60 mg daily.   Follow up 6/23, NYHA II-early III, volume stable on torsemide 40 mg. Qtc > 600 on ECG and SSRI stopped.  Follow up 5/24, doing ok. NYHA II-III and volume ok. Tolerating meds.  Acute visit 11/12/22 with increased SOB. Volume overloaded, weight up 10 lbs and REDs 40%. Torsemide increased to 100 mg daily.  Today he returns for HF follow up. Overall feeling fair, breathing is worse, he feels he has more fluid on board. He is SOB with ADLs. Denies palpitations, abnormal bleeding, CP, dizziness, edema, or PND/Orthopnea. Appetite ok. No fever or chills. Weight up 6 lbs. Taking all medications. Wears 2 L oxygen at night. 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   ROS: All systems reviewed and negative except as per HPI.   Past Medical History:  Diagnosis Date   Anemia due to chronic kidney disease    Bilateral inguinal hernia    CAD (coronary artery disease) cardiologist-  dr bensimhon   PTCA of OM in 1998, BMS OM in 2000, Cath 9/07 LM ok LAD ok. LCX 95% in OM prior to previous stent RCA. nondominant normal EF normal. Cypher DES to OM 2007 (PLACED PROXIAMAL TO PREVIOUS STENT)   Chronic kidney disease, stage III (moderate) (HCC)    nephrologist-  dr detarding   Depression    Diabetes mellitus type 2, diet-controlled (HCC)    followed by pcp,  last A1c 5.2 on 09-10-2017 in epic   Gout, unspecified    10-29-2017  per pt stable   HLD (hyperlipidemia)    HTN (hypertension)    MGUS (monoclonal gammopathy of unknown significance) previously followed by dr Cyndie Chime , Theron Arista and released 08/01/2011   IgG kappa dx 2002 8% plasma cells in bone marrow; no lesions on bone X-rays;   Nocturia    OA (osteoarthritis)    OSA on CPAP    per study 01-30-2004  severe osa , AHI 51.6/hr   PAD (peripheral artery disease) (HCC)    05-21-2006  left renal artery stenosis, s/p balloon angioplasty and stenting;  last duplex 02/ 2012  normal , arteries patent   S/P coronary artery stent placement    2000--  BMS x1  to OM;   2007-- DES x1  to OM proximal to previous stent   Secondary hyperparathyroidism of renal origin [facility])    Urgency of urination    Wears glasses     Current Outpatient Medications  Medication Sig Dispense Refill   allopurinol (ZYLOPRIM) 100 MG tablet Take 1 tablet (100 mg total) by mouth daily. 90 tablet 3   apixaban (ELIQUIS) 2.5 MG TABS tablet Take 2.5 mg by mouth 2 (two) times daily.     atorvastatin (LIPITOR) 80 MG tablet Take 1 tablet (80 mg total) by mouth daily. 90 tablet 3   cetirizine (ZYRTEC) 10 MG tablet Take 10 mg by mouth daily.     empagliflozin (JARDIANCE) 10 MG TABS tablet Patient takes 1 tablet by mouth every  other day.     ezetimibe (ZETIA) 10 MG tablet Take 10 mg by mouth daily.     levothyroxine (SYNTHROID) 25 MCG tablet Take 25 mcg by mouth daily before breakfast.     Menthol, Topical Analgesic, (BIOFREEZE) 4 % GEL Apply 1 application  topically in the morning and at bedtime. To right wrist/hand     Menthol, Topical Analgesic, (EUCERIN ITCH RELIEF) 0.1 % LOTN Apply 1 application  topically daily. To rash on chest/abdomin/back/neck/shoulder     Multiple Vitamin (MULTIVITAMIN) tablet Take 1 tablet by mouth daily.     Petrolatum Hydrophilic OINT Apply 1 application  topically at bedtime. To bilateral nares     potassium chloride SA (KLOR-CON M) 20 MEQ tablet Take 1 tablet (20 mEq total) by mouth daily. (Patient taking differently: Take 40 mEq by mouth daily.) 90 tablet 3   Skin Protectants, Misc. (EUCERIN) cream Apply 1 Application topically daily.     sodium chloride (OCEAN) 0.65 % SOLN nasal spray Place 1 spray into both nostrils daily.     tamsulosin (FLOMAX) 0.4 MG CAPS capsule Take 0.4 mg by mouth daily.     torsemide (DEMADEX) 20 MG tablet Take 5 tablets (100 mg total) by mouth daily. Please cancel all previous orders for current medication. Change in dosage or pill size. (Patient taking differently: Take 100 mg by mouth daily. Take 1 tablet by mouth daily Please cancel all previous orders for current medication. Change in dosage or pill size.)     White Petrolatum (VASELINE EX) Apply 1 application  topically at bedtime. To bilateral nares     Zinc Oxide 25 % PSTE Apply 1 application  topically 2 (two) times daily. To bottom during incontinence care     No current facility-administered medications for this encounter.   Social History   Tobacco Use   Smoking status: Some Days    Current packs/day: 0.25    Types: Cigarettes   Smokeless tobacco: Never   Tobacco comments:    10-29-2017  per pt was smoking 1pp2d, stopped June 2019  Vaping Use   Vaping status: Never Used  Substance Use  Topics   Alcohol use: Not Currently   Drug use: No   Family History  Problem Relation Age of Onset   Heart disease Mother    Healthy Father    Heart disease Sister    Heart disease Sister    Cancer Sister        type unknown   Hypertension Sister    Diabetes Brother    Diabetes Brother    Diabetes Other    Coronary artery disease Other    Cancer Other    Wt Readings from Last 3 Encounters:  02/02/23 89.2 kg (196 lb 9.6 oz)  12/10/22 86.5 kg (190 lb 12.8 oz)  11/12/22 87.7 kg (193 lb 6.4 oz)   BP 122/74   Pulse (!) 55   Wt 89.2 kg (196 lb 9.6 oz)   SpO2 98%   BMI 29.89 kg/m   PHYSICAL EXAM: General:  NAD. No resp difficulty, arrived in [facility], elderly HEENT: Normal Neck: Supple. JVP 10. Carotids 2+ bilat; no bruits. No lymphadenopathy or thryomegaly appreciated. Cor: PMI nondisplaced. Regular rate & rhythm. No rubs, gallops or murmurs. Lungs: Clear Abdomen: Soft, nontender, nondistended. No hepatosplenomegaly. No bruits or masses. Good bowel sounds. Extremities: No cyanosis, clubbing, rash, edema Neuro: Alert & oriented x 3, cranial nerves grossly intact. Moves all 4 extremities w/o difficulty. Affect pleasant.  ReDs: 40%  ECG (personally reviewed): NSR 1AVB, PACs 65 bpm  ASSESSMENT & PLAN:  1. Chronic Diastolic Heart Failure/Ischemic CM - Echo (4/22): EF 45-50%  - Echo (3/23): EF 45-50%, LV mildly down with global LV HK, mild LVH, RV mildly reduced, severely elevated PASP 67.4 mmHg, moderate to severe MR, severe TR, mild to moderate AI. - NYHA III, though assessment of functional class difficult due to deconditioning. Volume up today, ReDs 40% - Increase torsemide to 100 mg qam/40 mg qpm. OK to take extra 40 mg torsemide PRN - Continue 20 KCL daily. - Continue Jardiance 10 mg daily.  - No spiro w/ last SCr > 2. - No b-blocker due to HB. - No ACE/ARB/ARNi with hx angioedema on ace and arb. - Labs today, repeat BMET in 10 days.  2. CAD  - s/p NSTEMI 4/22.  -  Cath 4/22 with multivessel CAD with 85% proximal RCA, 85% mid RCA, 90% distal RCA, 80% mid LAD, 80% OM 2 lesion. Staged PCI with DES to the mid LAD, balloon angioplasty of left circumflex lesion, the diffusely diseased RCA was treated medically. - No s/s angina. - On DOAC - Continue statin. - Continue Jardiance.  3. Paroxysmal Atrial Flutter - Continue Eliquis 2.5 mg bid. - CHA2DS2-VASc Score = 5  - Zio (6/22): 39% burden  - Seen by EP during recent admit, not a good candidate for antiarrhythmic drugs. - SR on ECG today.  4. Mobitz 1 second degree HB in setting of NSTEMI 4/22 - No beta blocker. - No current indication for PPM. - PR 400 msec on ECG today.  5. HTN - Blood pressure well controlled.  - Continue current regimen. - Avoid hypotension  6. Aortic insufficiency/severe TR/moderate MR - Mild AI on echo 4/22 (stable from 2018) - Mild to moderate AI on echo 3/23. - Severe TR on echo 3/23 - Moderate to severe MR on echo 3/23 - Not mTEER candidate currently with lack of symptoms and with frailty/debility. - Clinically stable, follow.  7. CKD 3b - Baseline Scr ~2.0 - He is followed by [facility]. - On SGLT2i - Avoid hypotension - Labs today  8.T2DM - On SGLT2i.  - Recent A1C 5.6. - No GU symptoms.  9. Physical deconditioning - Continue PT at [facility]. - Palliative is following at facility.  Follow up in 6 months with Dr. Gala Romney   Jacklynn Ganong, FNP  11:16 AM

## 2023-02-02 ENCOUNTER — Encounter (HOSPITAL_COMMUNITY): Payer: Self-pay

## 2023-02-02 ENCOUNTER — Ambulatory Visit (HOSPITAL_COMMUNITY)
Admission: RE | Admit: 2023-02-02 | Discharge: 2023-02-02 | Disposition: A | Discharge status: Home / Self Care | Payer: Medicare HMO | Source: Ambulatory Visit | Attending: Family Medicine | Admitting: Family Medicine

## 2023-02-02 VITALS — BP 122/74 | HR 55 | Wt 196.6 lb

## 2023-02-02 DIAGNOSIS — I130 Hypertensive heart and chronic kidney disease with heart failure and stage 1 through stage 4 chronic kidney disease, or unspecified chronic kidney disease: Secondary | ICD-10-CM

## 2023-02-02 DIAGNOSIS — R1311 Dysphagia, oral phase: Secondary | ICD-10-CM | POA: Diagnosis not present

## 2023-02-02 DIAGNOSIS — I340 Nonrheumatic mitral (valve) insufficiency: Secondary | ICD-10-CM | POA: Insufficient documentation

## 2023-02-02 DIAGNOSIS — E119 Type 2 diabetes mellitus without complications: Secondary | ICD-10-CM | POA: Diagnosis not present

## 2023-02-02 DIAGNOSIS — I10 Essential (primary) hypertension: Secondary | ICD-10-CM | POA: Diagnosis not present

## 2023-02-02 DIAGNOSIS — R5381 Other malaise: Secondary | ICD-10-CM | POA: Diagnosis not present

## 2023-02-02 DIAGNOSIS — Z955 Presence of coronary angioplasty implant and graft: Secondary | ICD-10-CM | POA: Diagnosis not present

## 2023-02-02 DIAGNOSIS — Z7901 Long term (current) use of anticoagulants: Secondary | ICD-10-CM | POA: Diagnosis not present

## 2023-02-02 DIAGNOSIS — E1122 Type 2 diabetes mellitus with diabetic chronic kidney disease: Secondary | ICD-10-CM | POA: Insufficient documentation

## 2023-02-02 DIAGNOSIS — I484 Atypical atrial flutter: Secondary | ICD-10-CM | POA: Insufficient documentation

## 2023-02-02 DIAGNOSIS — M6281 Muscle weakness (generalized): Secondary | ICD-10-CM | POA: Diagnosis not present

## 2023-02-02 DIAGNOSIS — R9431 Abnormal electrocardiogram [ECG] [EKG]: Secondary | ICD-10-CM | POA: Diagnosis not present

## 2023-02-02 DIAGNOSIS — I4892 Unspecified atrial flutter: Secondary | ICD-10-CM

## 2023-02-02 DIAGNOSIS — I2510 Atherosclerotic heart disease of native coronary artery without angina pectoris: Secondary | ICD-10-CM

## 2023-02-02 DIAGNOSIS — I255 Ischemic cardiomyopathy: Secondary | ICD-10-CM | POA: Diagnosis not present

## 2023-02-02 DIAGNOSIS — I252 Old myocardial infarction: Secondary | ICD-10-CM | POA: Insufficient documentation

## 2023-02-02 DIAGNOSIS — E785 Hyperlipidemia, unspecified: Secondary | ICD-10-CM | POA: Insufficient documentation

## 2023-02-02 DIAGNOSIS — N1832 Chronic kidney disease, stage 3b: Secondary | ICD-10-CM | POA: Insufficient documentation

## 2023-02-02 DIAGNOSIS — I5032 Chronic diastolic (congestive) heart failure: Secondary | ICD-10-CM

## 2023-02-02 DIAGNOSIS — Z7984 Long term (current) use of oral hypoglycemic drugs: Secondary | ICD-10-CM | POA: Diagnosis not present

## 2023-02-02 DIAGNOSIS — I38 Endocarditis, valve unspecified: Secondary | ICD-10-CM | POA: Diagnosis not present

## 2023-02-02 DIAGNOSIS — N183 Chronic kidney disease, stage 3 unspecified: Secondary | ICD-10-CM

## 2023-02-02 DIAGNOSIS — I441 Atrioventricular block, second degree: Secondary | ICD-10-CM | POA: Diagnosis not present

## 2023-02-02 DIAGNOSIS — I351 Nonrheumatic aortic (valve) insufficiency: Secondary | ICD-10-CM | POA: Diagnosis not present

## 2023-02-02 DIAGNOSIS — I5043 Acute on chronic combined systolic (congestive) and diastolic (congestive) heart failure: Secondary | ICD-10-CM | POA: Diagnosis not present

## 2023-02-02 DIAGNOSIS — I361 Nonrheumatic tricuspid (valve) insufficiency: Secondary | ICD-10-CM | POA: Insufficient documentation

## 2023-02-02 DIAGNOSIS — D631 Anemia in chronic kidney disease: Secondary | ICD-10-CM | POA: Diagnosis not present

## 2023-02-02 LAB — BASIC METABOLIC PANEL
Anion gap: 12 (ref 5–15)
BUN: 55 mg/dL — ABNORMAL HIGH (ref 8–23)
CO2: 24 mmol/L (ref 22–32)
Calcium: 9.3 mg/dL (ref 8.9–10.3)
Chloride: 99 mmol/L (ref 98–111)
Creatinine, Ser: 2.72 mg/dL — ABNORMAL HIGH (ref 0.61–1.24)
GFR, Estimated: 22 mL/min — ABNORMAL LOW (ref >60)
Glucose, Bld: 69 mg/dL — ABNORMAL LOW (ref 70–99)
Potassium: 4.1 mmol/L (ref 3.5–5.1)
Sodium: 135 mmol/L (ref 135–145)

## 2023-02-02 LAB — BRAIN NATRIURETIC PEPTIDE: B Natriuretic Peptide: 365.1 pg/mL — ABNORMAL HIGH (ref 0.0–100.0)

## 2023-02-02 MED ORDER — TORSEMIDE 20 MG PO TABS: ORAL_TABLET | ORAL | Status: DC

## 2023-02-02 NOTE — Progress Notes (Signed)
ReDS Vest / Clip - 02/02/23 1100       ReDS Vest / Clip   Station Marker D    Ruler Value 32    ReDS Value Range Moderate volume overload    ReDS Actual Value 40

## 2023-02-02 NOTE — Patient Instructions (Addendum)
Thank you for coming in today  If you had labs drawn today, any labs that are abnormal the clinic will call you No news is good news  Medications: Increase torsemide to 100 mg 5 tablets every morning, 40 mg 2 tablets every evening  Follow up appointments:  Your physician recommends that you schedule a follow-up appointment in:  6 months With Dr. Gala Romney You will receive a reminder letter in the mail a few months in advance. If you don't receive a letter, please call our office to schedule the follow-up appointment.    Do the following things EVERYDAY: Weigh yourself in the morning before breakfast. Write it down and keep it in a log. Take your medicines as prescribed Eat low salt foods--Limit salt (sodium) to 2000 mg per day.  Stay as active as you can everyday Limit all fluids for the day to less than 2 liters   At the [facility], you and your health needs are our priority. As part of our continuing mission to provide you with exceptional heart care, we have created designated Provider Care Teams. These Care Teams include your primary Cardiologist (physician) and Advanced Practice Providers (APPs- Physician Assistants and Nurse Practitioners) who all work together to provide you with the care you need, when you need it.   You may see any of the following providers on your designated Care Team at your next follow up: Dr Arvilla Meres Dr Marca Ancona Dr. Marcos Eke, NP Robbie Lis, [place] [facility] [place], [place] Brynda Peon, NP Karle Plumber, PharmD   Please be sure to bring in all your medications bottles to every appointment.    Thank you for choosing [facility]  If you have any questions or concerns before your next appointment please send [place] a message through [place] or call our office at [phone].    TO LEAVE A MESSAGE FOR THE NURSE SELECT OPTION 2, PLEASE LEAVE A MESSAGE  INCLUDING: YOUR NAME DATE OF BIRTH CALL BACK NUMBER REASON FOR CALL**this is important as we prioritize the call backs  YOU WILL RECEIVE A CALL BACK THE SAME DAY AS LONG AS YOU CALL BEFORE 4:00 PM

## 2023-02-04 DIAGNOSIS — I5043 Acute on chronic combined systolic (congestive) and diastolic (congestive) heart failure: Secondary | ICD-10-CM | POA: Diagnosis not present

## 2023-02-04 DIAGNOSIS — R1311 Dysphagia, oral phase: Secondary | ICD-10-CM | POA: Diagnosis not present

## 2023-02-04 DIAGNOSIS — M6281 Muscle weakness (generalized): Secondary | ICD-10-CM | POA: Diagnosis not present

## 2023-02-05 ENCOUNTER — Telehealth (HOSPITAL_COMMUNITY): Payer: Self-pay

## 2023-02-05 DIAGNOSIS — E785 Hyperlipidemia, unspecified: Secondary | ICD-10-CM | POA: Diagnosis not present

## 2023-02-05 DIAGNOSIS — I25118 Atherosclerotic heart disease of native coronary artery with other forms of angina pectoris: Secondary | ICD-10-CM | POA: Diagnosis not present

## 2023-02-05 DIAGNOSIS — D631 Anemia in chronic kidney disease: Secondary | ICD-10-CM | POA: Diagnosis not present

## 2023-02-05 DIAGNOSIS — E1151 Type 2 diabetes mellitus with diabetic peripheral angiopathy without gangrene: Secondary | ICD-10-CM | POA: Diagnosis not present

## 2023-02-05 DIAGNOSIS — I4892 Unspecified atrial flutter: Secondary | ICD-10-CM | POA: Diagnosis not present

## 2023-02-05 DIAGNOSIS — E1122 Type 2 diabetes mellitus with diabetic chronic kidney disease: Secondary | ICD-10-CM | POA: Diagnosis not present

## 2023-02-05 DIAGNOSIS — Z7901 Long term (current) use of anticoagulants: Secondary | ICD-10-CM | POA: Diagnosis not present

## 2023-02-05 DIAGNOSIS — I5042 Chronic combined systolic (congestive) and diastolic (congestive) heart failure: Secondary | ICD-10-CM | POA: Diagnosis not present

## 2023-02-05 DIAGNOSIS — N184 Chronic kidney disease, stage 4 (severe): Secondary | ICD-10-CM | POA: Diagnosis not present

## 2023-02-05 DIAGNOSIS — E039 Hypothyroidism, unspecified: Secondary | ICD-10-CM | POA: Diagnosis not present

## 2023-02-05 DIAGNOSIS — M109 Gout, unspecified: Secondary | ICD-10-CM | POA: Diagnosis not present

## 2023-02-05 DIAGNOSIS — M6281 Muscle weakness (generalized): Secondary | ICD-10-CM | POA: Diagnosis not present

## 2023-02-05 DIAGNOSIS — I5043 Acute on chronic combined systolic (congestive) and diastolic (congestive) heart failure: Secondary | ICD-10-CM | POA: Diagnosis not present

## 2023-02-05 DIAGNOSIS — R1311 Dysphagia, oral phase: Secondary | ICD-10-CM | POA: Diagnosis not present

## 2023-02-05 NOTE — Telephone Encounter (Addendum)
[place] faxed to [facility] on 02/05/23   ----- Message from Jacklynn Ganong sent at 02/02/2023  3:12 PM EDT ----- BNP up, diuretics adjusted at visit today.  Please increase KCL to 40 daily. He has repeat labs arranged at SNF.

## 2023-02-06 DIAGNOSIS — I5043 Acute on chronic combined systolic (congestive) and diastolic (congestive) heart failure: Secondary | ICD-10-CM | POA: Diagnosis not present

## 2023-02-06 DIAGNOSIS — R1311 Dysphagia, oral phase: Secondary | ICD-10-CM | POA: Diagnosis not present

## 2023-02-06 DIAGNOSIS — M6281 Muscle weakness (generalized): Secondary | ICD-10-CM | POA: Diagnosis not present

## 2023-02-07 DIAGNOSIS — R1311 Dysphagia, oral phase: Secondary | ICD-10-CM | POA: Diagnosis not present

## 2023-02-07 DIAGNOSIS — M6281 Muscle weakness (generalized): Secondary | ICD-10-CM | POA: Diagnosis not present

## 2023-02-07 DIAGNOSIS — I5043 Acute on chronic combined systolic (congestive) and diastolic (congestive) heart failure: Secondary | ICD-10-CM | POA: Diagnosis not present

## 2023-02-09 DIAGNOSIS — I10 Essential (primary) hypertension: Secondary | ICD-10-CM | POA: Diagnosis not present

## 2023-02-09 DIAGNOSIS — R1311 Dysphagia, oral phase: Secondary | ICD-10-CM | POA: Diagnosis not present

## 2023-02-09 DIAGNOSIS — M6281 Muscle weakness (generalized): Secondary | ICD-10-CM | POA: Diagnosis not present

## 2023-02-09 DIAGNOSIS — I5043 Acute on chronic combined systolic (congestive) and diastolic (congestive) heart failure: Secondary | ICD-10-CM | POA: Diagnosis not present

## 2023-02-11 ENCOUNTER — Ambulatory Visit (HOSPITAL_COMMUNITY)
Admission: RE | Admit: 2023-02-11 | Discharge: 2023-02-11 | Disposition: A | Discharge status: Home / Self Care | Payer: Medicare HMO | Source: Ambulatory Visit | Attending: Internal Medicine | Admitting: Internal Medicine

## 2023-02-11 DIAGNOSIS — M6281 Muscle weakness (generalized): Secondary | ICD-10-CM | POA: Diagnosis not present

## 2023-02-11 DIAGNOSIS — I5043 Acute on chronic combined systolic (congestive) and diastolic (congestive) heart failure: Secondary | ICD-10-CM | POA: Diagnosis not present

## 2023-02-11 DIAGNOSIS — I5032 Chronic diastolic (congestive) heart failure: Secondary | ICD-10-CM | POA: Insufficient documentation

## 2023-02-11 DIAGNOSIS — R1311 Dysphagia, oral phase: Secondary | ICD-10-CM | POA: Diagnosis not present

## 2023-02-11 LAB — BASIC METABOLIC PANEL
Anion gap: 12 (ref 5–15)
BUN: 47 mg/dL — ABNORMAL HIGH (ref 8–23)
CO2: 22 mmol/L (ref 22–32)
Calcium: 9 mg/dL (ref 8.9–10.3)
Chloride: 104 mmol/L (ref 98–111)
Creatinine, Ser: 2.68 mg/dL — ABNORMAL HIGH (ref 0.61–1.24)
GFR, Estimated: 22 mL/min — ABNORMAL LOW (ref >60)
Glucose, Bld: 84 mg/dL (ref 70–99)
Potassium: 4.9 mmol/L (ref 3.5–5.1)
Sodium: 138 mmol/L (ref 135–145)

## 2023-02-12 DIAGNOSIS — I5043 Acute on chronic combined systolic (congestive) and diastolic (congestive) heart failure: Secondary | ICD-10-CM | POA: Diagnosis not present

## 2023-02-12 DIAGNOSIS — M6281 Muscle weakness (generalized): Secondary | ICD-10-CM | POA: Diagnosis not present

## 2023-02-12 DIAGNOSIS — R1311 Dysphagia, oral phase: Secondary | ICD-10-CM | POA: Diagnosis not present

## 2023-02-13 DIAGNOSIS — M6281 Muscle weakness (generalized): Secondary | ICD-10-CM | POA: Diagnosis not present

## 2023-02-13 DIAGNOSIS — I5043 Acute on chronic combined systolic (congestive) and diastolic (congestive) heart failure: Secondary | ICD-10-CM | POA: Diagnosis not present

## 2023-02-13 DIAGNOSIS — R1311 Dysphagia, oral phase: Secondary | ICD-10-CM | POA: Diagnosis not present

## 2023-02-16 DIAGNOSIS — M6281 Muscle weakness (generalized): Secondary | ICD-10-CM | POA: Diagnosis not present

## 2023-02-16 DIAGNOSIS — I5043 Acute on chronic combined systolic (congestive) and diastolic (congestive) heart failure: Secondary | ICD-10-CM | POA: Diagnosis not present

## 2023-02-16 DIAGNOSIS — R1311 Dysphagia, oral phase: Secondary | ICD-10-CM | POA: Diagnosis not present

## 2023-02-17 DIAGNOSIS — R1311 Dysphagia, oral phase: Secondary | ICD-10-CM | POA: Diagnosis not present

## 2023-02-17 DIAGNOSIS — M6281 Muscle weakness (generalized): Secondary | ICD-10-CM | POA: Diagnosis not present

## 2023-02-17 DIAGNOSIS — I5043 Acute on chronic combined systolic (congestive) and diastolic (congestive) heart failure: Secondary | ICD-10-CM | POA: Diagnosis not present

## 2023-02-18 DIAGNOSIS — R1311 Dysphagia, oral phase: Secondary | ICD-10-CM | POA: Diagnosis not present

## 2023-02-18 DIAGNOSIS — M6281 Muscle weakness (generalized): Secondary | ICD-10-CM | POA: Diagnosis not present

## 2023-02-18 DIAGNOSIS — I5043 Acute on chronic combined systolic (congestive) and diastolic (congestive) heart failure: Secondary | ICD-10-CM | POA: Diagnosis not present

## 2023-02-19 DIAGNOSIS — I5043 Acute on chronic combined systolic (congestive) and diastolic (congestive) heart failure: Secondary | ICD-10-CM | POA: Diagnosis not present

## 2023-02-19 DIAGNOSIS — R1311 Dysphagia, oral phase: Secondary | ICD-10-CM | POA: Diagnosis not present

## 2023-02-19 DIAGNOSIS — M6281 Muscle weakness (generalized): Secondary | ICD-10-CM | POA: Diagnosis not present

## 2023-02-20 DIAGNOSIS — R1311 Dysphagia, oral phase: Secondary | ICD-10-CM | POA: Diagnosis not present

## 2023-02-20 DIAGNOSIS — M6281 Muscle weakness (generalized): Secondary | ICD-10-CM | POA: Diagnosis not present

## 2023-02-20 DIAGNOSIS — I5043 Acute on chronic combined systolic (congestive) and diastolic (congestive) heart failure: Secondary | ICD-10-CM | POA: Diagnosis not present

## 2023-02-23 DIAGNOSIS — M6281 Muscle weakness (generalized): Secondary | ICD-10-CM | POA: Diagnosis not present

## 2023-02-23 DIAGNOSIS — I5043 Acute on chronic combined systolic (congestive) and diastolic (congestive) heart failure: Secondary | ICD-10-CM | POA: Diagnosis not present

## 2023-02-23 DIAGNOSIS — R1311 Dysphagia, oral phase: Secondary | ICD-10-CM | POA: Diagnosis not present

## 2023-02-24 DIAGNOSIS — M6281 Muscle weakness (generalized): Secondary | ICD-10-CM | POA: Diagnosis not present

## 2023-02-24 DIAGNOSIS — R1311 Dysphagia, oral phase: Secondary | ICD-10-CM | POA: Diagnosis not present

## 2023-02-24 DIAGNOSIS — I5043 Acute on chronic combined systolic (congestive) and diastolic (congestive) heart failure: Secondary | ICD-10-CM | POA: Diagnosis not present

## 2023-02-25 DIAGNOSIS — R1311 Dysphagia, oral phase: Secondary | ICD-10-CM | POA: Diagnosis not present

## 2023-02-25 DIAGNOSIS — M6281 Muscle weakness (generalized): Secondary | ICD-10-CM | POA: Diagnosis not present

## 2023-02-25 DIAGNOSIS — I5043 Acute on chronic combined systolic (congestive) and diastolic (congestive) heart failure: Secondary | ICD-10-CM | POA: Diagnosis not present

## 2023-02-26 DIAGNOSIS — R1311 Dysphagia, oral phase: Secondary | ICD-10-CM | POA: Diagnosis not present

## 2023-02-26 DIAGNOSIS — M6281 Muscle weakness (generalized): Secondary | ICD-10-CM | POA: Diagnosis not present

## 2023-02-26 DIAGNOSIS — R109 Unspecified abdominal pain: Secondary | ICD-10-CM | POA: Diagnosis not present

## 2023-02-26 DIAGNOSIS — I5043 Acute on chronic combined systolic (congestive) and diastolic (congestive) heart failure: Secondary | ICD-10-CM | POA: Diagnosis not present

## 2023-02-26 DIAGNOSIS — K921 Melena: Secondary | ICD-10-CM | POA: Diagnosis not present

## 2023-02-27 DIAGNOSIS — R109 Unspecified abdominal pain: Secondary | ICD-10-CM | POA: Diagnosis not present

## 2023-03-02 ENCOUNTER — Encounter (HOSPITAL_COMMUNITY): Payer: Medicare HMO | Admitting: Internal Medicine

## 2023-03-02 DIAGNOSIS — I5043 Acute on chronic combined systolic (congestive) and diastolic (congestive) heart failure: Secondary | ICD-10-CM | POA: Diagnosis not present

## 2023-03-02 DIAGNOSIS — R1311 Dysphagia, oral phase: Secondary | ICD-10-CM | POA: Diagnosis not present

## 2023-03-02 DIAGNOSIS — M6281 Muscle weakness (generalized): Secondary | ICD-10-CM | POA: Diagnosis not present

## 2023-03-03 DIAGNOSIS — D649 Anemia, unspecified: Secondary | ICD-10-CM | POA: Diagnosis not present

## 2023-03-03 DIAGNOSIS — R1311 Dysphagia, oral phase: Secondary | ICD-10-CM | POA: Diagnosis not present

## 2023-03-03 DIAGNOSIS — M6281 Muscle weakness (generalized): Secondary | ICD-10-CM | POA: Diagnosis not present

## 2023-03-03 DIAGNOSIS — I5043 Acute on chronic combined systolic (congestive) and diastolic (congestive) heart failure: Secondary | ICD-10-CM | POA: Diagnosis not present

## 2023-03-04 DIAGNOSIS — I5043 Acute on chronic combined systolic (congestive) and diastolic (congestive) heart failure: Secondary | ICD-10-CM | POA: Diagnosis not present

## 2023-03-04 DIAGNOSIS — D649 Anemia, unspecified: Secondary | ICD-10-CM | POA: Diagnosis not present

## 2023-03-04 DIAGNOSIS — R1311 Dysphagia, oral phase: Secondary | ICD-10-CM | POA: Diagnosis not present

## 2023-03-04 DIAGNOSIS — M6281 Muscle weakness (generalized): Secondary | ICD-10-CM | POA: Diagnosis not present

## 2023-03-05 DIAGNOSIS — D649 Anemia, unspecified: Secondary | ICD-10-CM | POA: Diagnosis not present

## 2023-03-05 DIAGNOSIS — I5043 Acute on chronic combined systolic (congestive) and diastolic (congestive) heart failure: Secondary | ICD-10-CM | POA: Diagnosis not present

## 2023-03-05 DIAGNOSIS — M6281 Muscle weakness (generalized): Secondary | ICD-10-CM | POA: Diagnosis not present

## 2023-03-05 DIAGNOSIS — R1311 Dysphagia, oral phase: Secondary | ICD-10-CM | POA: Diagnosis not present

## 2023-03-06 DIAGNOSIS — I5043 Acute on chronic combined systolic (congestive) and diastolic (congestive) heart failure: Secondary | ICD-10-CM | POA: Diagnosis not present

## 2023-03-06 DIAGNOSIS — M6281 Muscle weakness (generalized): Secondary | ICD-10-CM | POA: Diagnosis not present

## 2023-03-06 DIAGNOSIS — R1311 Dysphagia, oral phase: Secondary | ICD-10-CM | POA: Diagnosis not present

## 2023-03-09 DIAGNOSIS — I5043 Acute on chronic combined systolic (congestive) and diastolic (congestive) heart failure: Secondary | ICD-10-CM | POA: Diagnosis not present

## 2023-03-09 DIAGNOSIS — M6281 Muscle weakness (generalized): Secondary | ICD-10-CM | POA: Diagnosis not present

## 2023-03-09 DIAGNOSIS — R1311 Dysphagia, oral phase: Secondary | ICD-10-CM | POA: Diagnosis not present

## 2023-03-10 DIAGNOSIS — I482 Chronic atrial fibrillation, unspecified: Secondary | ICD-10-CM | POA: Diagnosis not present

## 2023-03-10 DIAGNOSIS — R1311 Dysphagia, oral phase: Secondary | ICD-10-CM | POA: Diagnosis not present

## 2023-03-10 DIAGNOSIS — Z7984 Long term (current) use of oral hypoglycemic drugs: Secondary | ICD-10-CM | POA: Diagnosis not present

## 2023-03-10 DIAGNOSIS — M6281 Muscle weakness (generalized): Secondary | ICD-10-CM | POA: Diagnosis not present

## 2023-03-10 DIAGNOSIS — N189 Chronic kidney disease, unspecified: Secondary | ICD-10-CM | POA: Diagnosis not present

## 2023-03-10 DIAGNOSIS — I509 Heart failure, unspecified: Secondary | ICD-10-CM | POA: Diagnosis not present

## 2023-03-10 DIAGNOSIS — Z7901 Long term (current) use of anticoagulants: Secondary | ICD-10-CM | POA: Diagnosis not present

## 2023-03-10 DIAGNOSIS — D631 Anemia in chronic kidney disease: Secondary | ICD-10-CM | POA: Diagnosis not present

## 2023-03-10 DIAGNOSIS — E1122 Type 2 diabetes mellitus with diabetic chronic kidney disease: Secondary | ICD-10-CM | POA: Diagnosis not present

## 2023-03-10 DIAGNOSIS — I5043 Acute on chronic combined systolic (congestive) and diastolic (congestive) heart failure: Secondary | ICD-10-CM | POA: Diagnosis not present

## 2023-03-10 DIAGNOSIS — I110 Hypertensive heart disease with heart failure: Secondary | ICD-10-CM | POA: Diagnosis not present

## 2023-03-11 DIAGNOSIS — R1311 Dysphagia, oral phase: Secondary | ICD-10-CM | POA: Diagnosis not present

## 2023-03-11 DIAGNOSIS — I5043 Acute on chronic combined systolic (congestive) and diastolic (congestive) heart failure: Secondary | ICD-10-CM | POA: Diagnosis not present

## 2023-03-11 DIAGNOSIS — M6281 Muscle weakness (generalized): Secondary | ICD-10-CM | POA: Diagnosis not present

## 2023-03-13 DIAGNOSIS — M6281 Muscle weakness (generalized): Secondary | ICD-10-CM | POA: Diagnosis not present

## 2023-03-13 DIAGNOSIS — R1311 Dysphagia, oral phase: Secondary | ICD-10-CM | POA: Diagnosis not present

## 2023-03-13 DIAGNOSIS — I5043 Acute on chronic combined systolic (congestive) and diastolic (congestive) heart failure: Secondary | ICD-10-CM | POA: Diagnosis not present

## 2023-03-17 DIAGNOSIS — I5043 Acute on chronic combined systolic (congestive) and diastolic (congestive) heart failure: Secondary | ICD-10-CM | POA: Diagnosis not present

## 2023-03-17 DIAGNOSIS — R1311 Dysphagia, oral phase: Secondary | ICD-10-CM | POA: Diagnosis not present

## 2023-03-17 DIAGNOSIS — M6281 Muscle weakness (generalized): Secondary | ICD-10-CM | POA: Diagnosis not present

## 2023-03-18 DIAGNOSIS — I5043 Acute on chronic combined systolic (congestive) and diastolic (congestive) heart failure: Secondary | ICD-10-CM | POA: Diagnosis not present

## 2023-03-18 DIAGNOSIS — R1311 Dysphagia, oral phase: Secondary | ICD-10-CM | POA: Diagnosis not present

## 2023-03-18 DIAGNOSIS — M6281 Muscle weakness (generalized): Secondary | ICD-10-CM | POA: Diagnosis not present

## 2023-03-19 DIAGNOSIS — I5043 Acute on chronic combined systolic (congestive) and diastolic (congestive) heart failure: Secondary | ICD-10-CM | POA: Diagnosis not present

## 2023-03-19 DIAGNOSIS — R1311 Dysphagia, oral phase: Secondary | ICD-10-CM | POA: Diagnosis not present

## 2023-03-19 DIAGNOSIS — M6281 Muscle weakness (generalized): Secondary | ICD-10-CM | POA: Diagnosis not present

## 2023-03-20 DIAGNOSIS — I5043 Acute on chronic combined systolic (congestive) and diastolic (congestive) heart failure: Secondary | ICD-10-CM | POA: Diagnosis not present

## 2023-03-20 DIAGNOSIS — R1311 Dysphagia, oral phase: Secondary | ICD-10-CM | POA: Diagnosis not present

## 2023-03-20 DIAGNOSIS — M6281 Muscle weakness (generalized): Secondary | ICD-10-CM | POA: Diagnosis not present

## 2023-03-22 DIAGNOSIS — R1311 Dysphagia, oral phase: Secondary | ICD-10-CM | POA: Diagnosis not present

## 2023-03-22 DIAGNOSIS — I5043 Acute on chronic combined systolic (congestive) and diastolic (congestive) heart failure: Secondary | ICD-10-CM | POA: Diagnosis not present

## 2023-03-22 DIAGNOSIS — M6281 Muscle weakness (generalized): Secondary | ICD-10-CM | POA: Diagnosis not present

## 2023-03-23 DIAGNOSIS — R1311 Dysphagia, oral phase: Secondary | ICD-10-CM | POA: Diagnosis not present

## 2023-03-23 DIAGNOSIS — I5043 Acute on chronic combined systolic (congestive) and diastolic (congestive) heart failure: Secondary | ICD-10-CM | POA: Diagnosis not present

## 2023-03-23 DIAGNOSIS — M6281 Muscle weakness (generalized): Secondary | ICD-10-CM | POA: Diagnosis not present

## 2023-03-24 NOTE — Progress Notes (Unsigned)
03/25/2023 Kevin Schwartz [id number] [birth year]  Referring provider: Venita Sheffield, * Primary GI doctor: Dr. Marina Goodell  ASSESSMENT AND PLAN:  Melena x 3 days, no further episodes Has never had before, no NSAIDS Has had some SOB since episode of melena CT 04/2022 with hiatal hernia The patient is hemodynamically stable with some wheezing on exam I would like to see what his blood counts and iron look like at this point in time. Depending on the iron indices and if the patient can not tolerate oral iron, may need to consider iron transfusion outpatient  ER precautions discussed with the patient: severe weakness/dizziness, severe AB pain, vomit blood, shortness of breath or chest pain.  Patient very high risk for EGD, after discussion will hold off at this time since FOBT negative today, start on protonix 40 mg once daily, check H pylori stool, check CBC/iron ferritin with close follow up Close follow up 6-8 weeks, if patient needs EGD would have to be at hospital and would need cardiac clearance.   Pulmonary hypertension/OSA RSVP very elevated Not on CPAP, on O2 at night Wheezing on exam, follow up with PCP  CAD s/p stent 2022 On eliquis No chest pain  Aflutter  on eliquis Would need to be held prior to evaluation  Referred to GI from [facility]    Patient Care Team: Venita Sheffield, MD as PCP - General (Internal Medicine) Bensimhon, Bevelyn Buckles, MD as PCP - Cardiology (Cardiology) Bensimhon, Bevelyn Buckles, MD as Consulting Physician (Cardiology) Penninger, Lillia Abed, [place] as Physician Assistant (Nephrology) Terrial Rhodes, MD as Consulting Physician (Nephrology) Karie Soda, MD as Consulting Physician (General Surgery) Loletha Carrow, MD as Consulting Physician (Ophthalmology)  HISTORY OF PRESENT ILLNESS: 89 y.o. male with a past medical history of DM 2, CKD, hypertension, CAD status post multiple PCI's, RAS s/p stenting, mild CKD, and MGUS and  others listed below presents for evaluation of melena.   01/24/2016 OV with Gunnar Fusi for FOBT +, set up for colon 02/08/2016 Colon for FOBT+ 3 mm, diverticulosis, hemorrhoids 05/14/2022 CT AB and pelvis without contrast for AB pain small hiatal hernia, no acute pathology, cardiomegaly, 4 distal AB aortic aneurysm 3 CM 12/12/2022 HGB 12.7, MCV 93.3, BNP 137. 07/11/2021 Iron 26, sat 9  Patient admitted 07/2020 with NSTEMI.  Echo 45-50%, cath with multivessel disease, s/p PCI with DES to mid LAD, ballow angio left circumflex.  06/2021 ADHF EF 45-50% severely elevated pulmonary systolic pressure 67.4, he is on O2 at night for last 2 years. Moderate to severe MR  Discussed the use of AI scribe software for clinical note transcription with the patient, who gave verbal consent to proceed.  History of Present Illness   The patient, with a history of heart attack, stent placement, and pulmonary hypertension, presented with a chief complaint of dark, tarry stools for three consecutive days approximately three weeks ago. This was the first occurrence of such an episode, and the patient denied any changes in diet, medication, or supplement intake that could have contributed to the change in stool color. The patient denied any associated symptoms such as nausea, vomiting, or abdominal pain. The patient also denied the use of NSAIDs, prednisone, or alcohol, but admitted to smoking.  Since the episode of dark stools, the patient reported a return to normal brown stools. However, the patient also reported an increase in shortness of breath over the past three weeks, particularly after bowel movements. The patient has been on Eliquis 2.5 mg twice daily since a  heart attack in 2022 and uses oxygen at night. The patient denied any symptoms of reflux or heartburn and is not on any medications for these conditions.  The patient has a history of sleep apnea and uses a CPAP machine. The patient also reported occasional  cramping pain after bowel movements. The patient's last colonoscopy in 2017 was unremarkable, and a CT scan of the abdomen and pelvis in January revealed a small hiatal hernia. The patient had a low iron level a year ago. The patient uses a walker for mobility and is wheelchair-bound for long distances. The patient denied any recent weight loss.      He  reports that he has been smoking cigarettes. He has never used smokeless tobacco. He reports that he does not currently use alcohol. He reports that he does not use drugs.  RELEVANT LABS AND IMAGING:  Results   LABS Iron: Low (2023)  RADIOLOGY CT abdomen and pelvis: Small hiatal hernia (04/2022)  DIAGNOSTIC Colonoscopy: Normal (2017)      CBC    Component Value Date/Time   WBC 11.9 (H) 05/16/2022 0557   RBC 2.59 (L) 05/16/2022 0557   HGB 8.3 (L) 05/16/2022 0557   HGB 12.8 07/30/2015 0919   HGB 11.9 (L) 10/06/2013 1016   HCT 23.1 (L) 05/16/2022 0557   HCT 38.8 07/30/2015 0919   HCT 36.1 (L) 10/06/2013 1016   PLT 225 05/16/2022 0557   PLT 382 (H) 07/30/2015 0919   MCV 89.2 05/16/2022 0557   MCV 93 07/30/2015 0919   MCV 95.2 10/06/2013 1016   MCH 32.0 05/16/2022 0557   MCHC 35.9 05/16/2022 0557   RDW 16.8 (H) 05/16/2022 0557   RDW 15.2 07/30/2015 0919   RDW 14.4 10/06/2013 1016   LYMPHSABS 1.1 05/16/2022 0557   LYMPHSABS 1.8 07/30/2015 0919   LYMPHSABS 0.9 10/06/2013 1016   MONOABS 1.0 05/16/2022 0557   MONOABS 0.5 10/06/2013 1016   EOSABS 1.5 (H) 05/16/2022 0557   EOSABS 0.3 07/30/2015 0919   BASOSABS 0.0 05/16/2022 0557   BASOSABS 0.0 07/30/2015 0919   BASOSABS 0.1 10/06/2013 1016   Recent Labs    05/14/22 1355 05/14/22 1848 05/15/22 0309 05/15/22 0927 05/15/22 1842 05/16/22 0557  HGB 6.9* 6.7* 8.4* 8.5* 8.7* 8.3*    CMP     Component Value Date/Time   NA 138 02/11/2023 1106   NA 143 04/30/2015 1017   NA 140 10/06/2013 1016   K 4.9 02/11/2023 1106   K 4.5 10/06/2013 1016   CL 104 02/11/2023 1106    CL 108 (H) 07/23/2012 1031   CO2 22 02/11/2023 1106   CO2 23 10/06/2013 1016   GLUCOSE 84 02/11/2023 1106   GLUCOSE 97 10/06/2013 1016   GLUCOSE 116 (H) 07/23/2012 1031   BUN 47 (H) 02/11/2023 1106   BUN 21 04/30/2015 1017   BUN 17.2 10/06/2013 1016   CREATININE 2.68 (H) 02/11/2023 1106   CREATININE 2.04 (H) 04/11/2021 1127   CREATININE 1.4 (H) 10/06/2013 1016   CALCIUM 9.0 02/11/2023 1106   CALCIUM 8.5 10/06/2013 1016   PROT 6.4 (L) 05/15/2022 0309   PROT 7.3 02/04/2021 1030   PROT 7.7 10/06/2013 1016   ALBUMIN 3.2 (L) 05/15/2022 0309   ALBUMIN 4.4 02/04/2021 1030   ALBUMIN 3.9 10/06/2013 1016   AST 26 05/15/2022 0309   AST 18 11/16/2018 1245   AST 23 10/06/2013 1016   ALT 16 05/15/2022 0309   ALT 13 11/16/2018 1245   ALT 23 10/06/2013 1016  ALKPHOS 50 05/15/2022 0309   ALKPHOS 90 10/06/2013 1016   BILITOT 0.5 05/15/2022 0309   BILITOT 0.7 02/04/2021 1030   BILITOT 0.4 11/16/2018 1245   BILITOT 0.39 10/06/2013 1016   GFRNONAA 22 (L) 02/11/2023 1106   GFRNONAA 46 (L) 08/07/2020 0000   GFRAA 53 (L) 08/07/2020 0000      Latest Ref Rng & Units 05/15/2022    3:09 AM 05/14/2022    6:48 PM 05/14/2022    1:55 PM  Hepatic Function  Total Protein 6.5 - 8.1 g/dL 6.4  6.9  7.6   Albumin 3.5 - 5.0 g/dL 3.2  3.6  3.8   AST 15 - 41 U/L 26  34  39   ALT 0 - 44 U/L 16  17  21    Alk Phosphatase 38 - 126 U/L 50  57  61   Total Bilirubin 0.3 - 1.2 mg/dL 0.5  0.5  0.7       Current Medications:   Current Outpatient Medications (Endocrine & Metabolic):    empagliflozin (JARDIANCE) 10 MG TABS tablet, Patient takes 1 tablet by mouth every other day.   levothyroxine (SYNTHROID) 25 MCG tablet, Take 25 mcg by mouth daily before breakfast.  Current Outpatient Medications (Cardiovascular):    atorvastatin (LIPITOR) 80 MG tablet, Take 1 tablet (80 mg total) by mouth daily.   ezetimibe (ZETIA) 10 MG tablet, Take 10 mg by mouth daily.   torsemide (DEMADEX) 20 MG tablet, Take 5 tablets (100  mg total) by mouth every morning AND 2 tablets (40 mg total) every evening. Please cancel all previous orders for current medication. Change in dosage or pill size..  Current Outpatient Medications (Respiratory):    cetirizine (ZYRTEC) 10 MG tablet, Take 10 mg by mouth daily.   sodium chloride (OCEAN) 0.65 % SOLN nasal spray, Place 1 spray into both nostrils daily.  Current Outpatient Medications (Analgesics):    acetaminophen (TYLENOL) 500 MG tablet, Take 500 mg by mouth every 6 (six) hours as needed.   allopurinol (ZYLOPRIM) 100 MG tablet, Take 1 tablet (100 mg total) by mouth daily.  Current Outpatient Medications (Hematological):    apixaban (ELIQUIS) 2.5 MG TABS tablet, Take 2.5 mg by mouth 2 (two) times daily.  Current Outpatient Medications (Other):    diclofenac Sodium (VOLTAREN) 1 % GEL, Apply 2 g topically 3 (three) times daily.   Menthol, Topical Analgesic, (BIOFREEZE) 4 % GEL, Apply 1 application  topically in the morning and at bedtime. To right wrist/hand   Menthol, Topical Analgesic, (EUCERIN ITCH RELIEF) 0.1 % LOTN, Apply 1 application  topically daily. To rash on chest/abdomin/back/neck/shoulder   Multiple Vitamin (MULTIVITAMIN) tablet, Take 1 tablet by mouth daily.   Petrolatum Hydrophilic OINT, Apply 1 application  topically at bedtime. To bilateral nares   Skin Protectants, Misc. (EUCERIN) cream, Apply 1 Application topically daily.   White Petrolatum (VASELINE EX), Apply 1 application  topically at bedtime. To bilateral nares   Zinc Oxide 25 % PSTE, Apply 1 application  topically 2 (two) times daily. To bottom during incontinence care   pantoprazole (PROTONIX) 40 MG tablet, Take 1 tablet (40 mg total) by mouth daily.   potassium chloride SA (KLOR-CON M) 20 MEQ tablet, Take 1 tablet (20 mEq total) by mouth daily. (Patient taking differently: Take 40 mEq by mouth daily.)   tamsulosin (FLOMAX) 0.4 MG CAPS capsule, Take 0.4 mg by mouth daily. (Patient not taking: Reported on  03/25/2023)  Medical History:  Past Medical History:  Diagnosis Date  Anemia due to chronic kidney disease    Bilateral inguinal hernia    CAD (coronary artery disease) cardiologist-  dr bensimhon   PTCA of OM in 1998, BMS OM in 2000, Cath 9/07 LM ok LAD ok. LCX 95% in OM prior to previous stent RCA. nondominant normal EF normal. Cypher DES to OM 2007 (PLACED PROXIAMAL TO PREVIOUS STENT)   Chronic kidney disease, stage III (moderate) (HCC)    nephrologist-  dr detarding   Depression    Diabetes mellitus type 2, diet-controlled (HCC)    followed by pcp,  last A1c 5.2 on 09-10-2017 in epic   Gout, unspecified    10-29-2017  per pt stable   HLD (hyperlipidemia)    HTN (hypertension)    MGUS (monoclonal gammopathy of unknown significance) previously followed by dr Cyndie Chime , Theron Arista and released 08/01/2011   IgG kappa dx 2002 8% plasma cells in bone marrow; no lesions on bone X-rays;   Nocturia    OA (osteoarthritis)    OSA on CPAP    per study 01-30-2004  severe osa , AHI 51.6/hr   PAD (peripheral artery disease) (HCC)    05-21-2006  left renal artery stenosis, s/p balloon angioplasty and stenting;  last duplex 02/ 2012  normal , arteries patent   S/P coronary artery stent placement    2000--  BMS x1  to OM;   2007-- DES x1  to OM proximal to previous stent   Secondary hyperparathyroidism of renal origin [facility])    Urgency of urination    Wears glasses    Allergies:  Allergies  Allergen Reactions   Cozaar [Losartan Potassium] Swelling and Other (See Comments)    Angioedema    Zestril [Lisinopril] Swelling and Other (See Comments)    Angioedema    Crestor [Rosuvastatin] Swelling and Other (See Comments)    Angioedema    Tape Other (See Comments)    Sensitive skin, tears easily.     Surgical History:  He  has a past surgical history that includes Total knee arthroplasty (Left, 07-22-2005   dr Simonne Come  [facility]); Coronary angioplasty (1998); Coronary angioplasty with stent  (2000); Coronary angioplasty with stent (12-18-2005  dr Tresa Endo); Renal angioplasty (Left, 05-21-2006   dr berry); transthoracic echocardiogram (04/18/2016); Cardiovascular stress test (2010); Inguinal hernia repair (Bilateral, 10/30/2017); Insertion of mesh (Bilateral, 10/30/2017); Hernia repair; LEFT HEART CATH AND CORONARY ANGIOGRAPHY (N/A, 08/01/2020); CORONARY STENT INTERVENTION (N/A, 08/02/2020); and CORONARY BALLOON ANGIOPLASTY (N/A, 08/02/2020). Family History:  His family history includes Cancer in his sister and another family member; Coronary artery disease in an other family member; Diabetes in his brother, brother and another family member; Healthy in his father; Heart disease in his mother, sister, and sister; Hypertension in his sister.  REVIEW OF SYSTEMS  : All other systems reviewed and negative except where noted in the History of Present Illness.  PHYSICAL EXAM: BP (!) 136/90   Pulse 88  General Appearance: chronically ill appearing AA male in no apparent distress. Head:   Normocephalic and atraumatic. Eyes:  sclerae anicteric,conjunctive pink  Respiratory: Respiratory effort normal, wheezing bilateral Cardio: RRR with systolic murmur Peripheral pulses intact.  Abdomen: Soft,  Obese ,active bowel sounds. No tenderness .[name] No masses. Rectal: Normal external rectal exam with some stool seepage, normal rectal tone, no internal hemorrhoids appreciated, no masses, non tender, liquid brown stool, hemoccult Negative Musculoskeletal: Full ROM, not tested gait, in wheel chair, able to stand for rectal exam with assistance, uses walker at home, With edema.  Skin:  Dry and intact without significant lesions or rashes Neuro: Alert and  oriented x4;  No focal deficits. Psych:  Cooperative. Normal mood and affect.    Doree Albee, PA-C 2:37 PM

## 2023-03-25 ENCOUNTER — Encounter: Payer: Self-pay | Admitting: Physician Assistant

## 2023-03-25 ENCOUNTER — Other Ambulatory Visit (INDEPENDENT_AMBULATORY_CARE_PROVIDER_SITE_OTHER): Payer: Medicare HMO

## 2023-03-25 ENCOUNTER — Ambulatory Visit: Payer: Medicare HMO | Admitting: Physician Assistant

## 2023-03-25 VITALS — BP 136/90 | HR 88

## 2023-03-25 DIAGNOSIS — Z7901 Long term (current) use of anticoagulants: Secondary | ICD-10-CM | POA: Diagnosis not present

## 2023-03-25 DIAGNOSIS — G4733 Obstructive sleep apnea (adult) (pediatric): Secondary | ICD-10-CM

## 2023-03-25 DIAGNOSIS — R0602 Shortness of breath: Secondary | ICD-10-CM

## 2023-03-25 DIAGNOSIS — R1311 Dysphagia, oral phase: Secondary | ICD-10-CM | POA: Diagnosis not present

## 2023-03-25 DIAGNOSIS — I483 Typical atrial flutter: Secondary | ICD-10-CM

## 2023-03-25 DIAGNOSIS — I4892 Unspecified atrial flutter: Secondary | ICD-10-CM

## 2023-03-25 DIAGNOSIS — I272 Pulmonary hypertension, unspecified: Secondary | ICD-10-CM | POA: Diagnosis not present

## 2023-03-25 DIAGNOSIS — K921 Melena: Secondary | ICD-10-CM

## 2023-03-25 DIAGNOSIS — I5043 Acute on chronic combined systolic (congestive) and diastolic (congestive) heart failure: Secondary | ICD-10-CM | POA: Diagnosis not present

## 2023-03-25 DIAGNOSIS — M6281 Muscle weakness (generalized): Secondary | ICD-10-CM | POA: Diagnosis not present

## 2023-03-25 DIAGNOSIS — I2510 Atherosclerotic heart disease of native coronary artery without angina pectoris: Secondary | ICD-10-CM

## 2023-03-25 DIAGNOSIS — J9611 Chronic respiratory failure with hypoxia: Secondary | ICD-10-CM

## 2023-03-25 DIAGNOSIS — D649 Anemia, unspecified: Secondary | ICD-10-CM

## 2023-03-25 DIAGNOSIS — D472 Monoclonal gammopathy: Secondary | ICD-10-CM

## 2023-03-25 LAB — CBC WITH DIFFERENTIAL/PLATELET
Basophils Absolute: 0.1 10*3/uL (ref 0.0–0.1)
Basophils Relative: 1.2 % (ref 0.0–3.0)
Eosinophils Absolute: 0.4 10*3/uL (ref 0.0–0.7)
Eosinophils Relative: 6.2 % — ABNORMAL HIGH (ref 0.0–5.0)
HCT: 32 % — ABNORMAL LOW (ref 39.0–52.0)
Hemoglobin: 10.6 g/dL — ABNORMAL LOW (ref 13.0–17.0)
Lymphocytes Relative: 14.2 % (ref 12.0–46.0)
Lymphs Abs: 1 10*3/uL (ref 0.7–4.0)
MCHC: 33.1 g/dL (ref 30.0–36.0)
MCV: 97.3 fL (ref 78.0–100.0)
Monocytes Absolute: 0.8 10*3/uL (ref 0.1–1.0)
Monocytes Relative: 11 % (ref 3.0–12.0)
Neutro Abs: 4.8 10*3/uL (ref 1.4–7.7)
Neutrophils Relative %: 67.4 % (ref 43.0–77.0)
Platelets: 353 10*3/uL (ref 150.0–400.0)
RBC: 3.29 Mil/uL — ABNORMAL LOW (ref 4.22–5.81)
RDW: 16.7 % — ABNORMAL HIGH (ref 11.5–15.5)
WBC: 7.1 10*3/uL (ref 4.0–10.5)

## 2023-03-25 LAB — COMPREHENSIVE METABOLIC PANEL
ALT: 12 U/L (ref 0–53)
AST: 17 U/L (ref 0–37)
Albumin: 4.5 g/dL (ref 3.5–5.2)
Alkaline Phosphatase: 82 U/L (ref 39–117)
BUN: 51 mg/dL — ABNORMAL HIGH (ref 6–23)
CO2: 25 meq/L (ref 19–32)
Calcium: 9.3 mg/dL (ref 8.4–10.5)
Chloride: 103 meq/L (ref 96–112)
Creatinine, Ser: 2.76 mg/dL — ABNORMAL HIGH (ref 0.40–1.50)
GFR: 19.63 mL/min — ABNORMAL LOW (ref >60.00)
Glucose, Bld: 95 mg/dL (ref 70–99)
Potassium: 4.9 meq/L (ref 3.5–5.1)
Sodium: 138 meq/L (ref 135–145)
Total Bilirubin: 0.5 mg/dL (ref 0.2–1.2)
Total Protein: 8.7 g/dL — ABNORMAL HIGH (ref 6.0–8.3)

## 2023-03-25 LAB — IBC + FERRITIN
Ferritin: 127.1 ng/mL (ref 22.0–322.0)
Iron: 37 ug/dL — ABNORMAL LOW (ref 42–165)
Saturation Ratios: 12 % — ABNORMAL LOW (ref 20.0–50.0)
TIBC: 308 ug/dL (ref 250.0–450.0)
Transferrin: 220 mg/dL (ref 212.0–360.0)

## 2023-03-25 MED ORDER — PANTOPRAZOLE SODIUM 40 MG PO TBEC: 40.0000 mg | DELAYED_RELEASE_TABLET | Freq: Every day | ORAL | 3 refills | Status: DC

## 2023-03-25 MED ORDER — PANTOPRAZOLE SODIUM 40 MG PO TBEC: 40.0000 mg | DELAYED_RELEASE_TABLET | Freq: Every day | ORAL | 3 refills | Status: AC

## 2023-03-25 NOTE — Progress Notes (Signed)
PA assessment and plan noted. Not sure if his dark stools represented melena. He is Hemoccult negative today. His hemoglobin is actually up from his last check months ago. Make sure he is on a daily PPI indefinitely. Thanks, Dr.  Marina Goodell

## 2023-03-25 NOTE — Patient Instructions (Addendum)
You have been scheduled for an appointment with Quentin Mulling PA-C on 06-10-2023 at 130pm . Please arrive 10 minutes early for your appointment.  Please take your specimen to the lab Your provider has requested that you go to the basement level for lab work before leaving today. Press "B" on the elevator. The lab is located at the first door on the left as you exit the elevator.  Follow up PCP or heart doctor  Please take your proton pump inhibitor medication, protonix 40 mg daily  Please take this medication 30 minutes to 1 hour before meals- this makes it more effective.  Avoid spicy and acidic foods Avoid fatty foods Limit your intake of coffee, tea, alcohol, and carbonated drinks Work to maintain a healthy weight Keep the head of the bed elevated at least 3 inches with blocks or a wedge pillow if you are having any nighttime symptoms Stay upright for 2 hours after eating Avoid meals and snacks three to four hours before bedtime Stop smoking  Advised to go to the ER if there is any severe weakness, severe abdominal pain, vomit blood, dark red blood in your bowel movement, shortness of breath or chest pain.

## 2023-03-26 DIAGNOSIS — I5043 Acute on chronic combined systolic (congestive) and diastolic (congestive) heart failure: Secondary | ICD-10-CM | POA: Diagnosis not present

## 2023-03-26 DIAGNOSIS — R0602 Shortness of breath: Secondary | ICD-10-CM | POA: Diagnosis not present

## 2023-03-26 DIAGNOSIS — K921 Melena: Secondary | ICD-10-CM | POA: Diagnosis not present

## 2023-03-26 DIAGNOSIS — R1311 Dysphagia, oral phase: Secondary | ICD-10-CM | POA: Diagnosis not present

## 2023-03-26 DIAGNOSIS — R0989 Other specified symptoms and signs involving the circulatory and respiratory systems: Secondary | ICD-10-CM | POA: Diagnosis not present

## 2023-03-26 DIAGNOSIS — M6281 Muscle weakness (generalized): Secondary | ICD-10-CM | POA: Diagnosis not present

## 2023-03-27 DIAGNOSIS — E119 Type 2 diabetes mellitus without complications: Secondary | ICD-10-CM | POA: Diagnosis not present

## 2023-03-27 DIAGNOSIS — I10 Essential (primary) hypertension: Secondary | ICD-10-CM | POA: Diagnosis not present

## 2023-03-30 ENCOUNTER — Telehealth: Payer: Self-pay | Admitting: Physician Assistant

## 2023-03-30 DIAGNOSIS — M6281 Muscle weakness (generalized): Secondary | ICD-10-CM | POA: Diagnosis not present

## 2023-03-30 DIAGNOSIS — R1311 Dysphagia, oral phase: Secondary | ICD-10-CM | POA: Diagnosis not present

## 2023-03-30 DIAGNOSIS — I5043 Acute on chronic combined systolic (congestive) and diastolic (congestive) heart failure: Secondary | ICD-10-CM | POA: Diagnosis not present

## 2023-03-30 NOTE — Telephone Encounter (Signed)
Negative H. pylori [employer] Had negative FOBT in the office, please assure that patient is on Protonix 40 mg once daily indefinitely.

## 2023-03-30 NOTE — Telephone Encounter (Signed)
Pt notified H pylori test negative.

## 2023-03-31 DIAGNOSIS — M6281 Muscle weakness (generalized): Secondary | ICD-10-CM | POA: Diagnosis not present

## 2023-03-31 DIAGNOSIS — I5043 Acute on chronic combined systolic (congestive) and diastolic (congestive) heart failure: Secondary | ICD-10-CM | POA: Diagnosis not present

## 2023-03-31 DIAGNOSIS — R1311 Dysphagia, oral phase: Secondary | ICD-10-CM | POA: Diagnosis not present

## 2023-04-01 DIAGNOSIS — M6281 Muscle weakness (generalized): Secondary | ICD-10-CM | POA: Diagnosis not present

## 2023-04-01 DIAGNOSIS — R1311 Dysphagia, oral phase: Secondary | ICD-10-CM | POA: Diagnosis not present

## 2023-04-01 DIAGNOSIS — I5043 Acute on chronic combined systolic (congestive) and diastolic (congestive) heart failure: Secondary | ICD-10-CM | POA: Diagnosis not present

## 2023-04-02 DIAGNOSIS — R635 Abnormal weight gain: Secondary | ICD-10-CM | POA: Diagnosis not present

## 2023-04-03 DIAGNOSIS — R1311 Dysphagia, oral phase: Secondary | ICD-10-CM | POA: Diagnosis not present

## 2023-04-03 DIAGNOSIS — M6281 Muscle weakness (generalized): Secondary | ICD-10-CM | POA: Diagnosis not present

## 2023-04-03 DIAGNOSIS — I5043 Acute on chronic combined systolic (congestive) and diastolic (congestive) heart failure: Secondary | ICD-10-CM | POA: Diagnosis not present

## 2023-04-04 DIAGNOSIS — I10 Essential (primary) hypertension: Secondary | ICD-10-CM | POA: Diagnosis not present

## 2023-04-06 DIAGNOSIS — I5043 Acute on chronic combined systolic (congestive) and diastolic (congestive) heart failure: Secondary | ICD-10-CM | POA: Diagnosis not present

## 2023-04-06 DIAGNOSIS — R1311 Dysphagia, oral phase: Secondary | ICD-10-CM | POA: Diagnosis not present

## 2023-04-06 DIAGNOSIS — M6281 Muscle weakness (generalized): Secondary | ICD-10-CM | POA: Diagnosis not present

## 2023-04-07 DIAGNOSIS — I5042 Chronic combined systolic (congestive) and diastolic (congestive) heart failure: Secondary | ICD-10-CM | POA: Diagnosis not present

## 2023-04-07 DIAGNOSIS — R109 Unspecified abdominal pain: Secondary | ICD-10-CM | POA: Diagnosis not present

## 2023-04-07 DIAGNOSIS — E1122 Type 2 diabetes mellitus with diabetic chronic kidney disease: Secondary | ICD-10-CM | POA: Diagnosis not present

## 2023-04-07 DIAGNOSIS — E039 Hypothyroidism, unspecified: Secondary | ICD-10-CM | POA: Diagnosis not present

## 2023-04-07 DIAGNOSIS — N184 Chronic kidney disease, stage 4 (severe): Secondary | ICD-10-CM | POA: Diagnosis not present

## 2023-04-07 DIAGNOSIS — D472 Monoclonal gammopathy: Secondary | ICD-10-CM | POA: Diagnosis not present

## 2023-04-07 DIAGNOSIS — D631 Anemia in chronic kidney disease: Secondary | ICD-10-CM | POA: Diagnosis not present

## 2023-04-07 DIAGNOSIS — K921 Melena: Secondary | ICD-10-CM | POA: Diagnosis not present

## 2023-04-07 DIAGNOSIS — I4892 Unspecified atrial flutter: Secondary | ICD-10-CM | POA: Diagnosis not present

## 2023-04-10 DIAGNOSIS — L602 Onychogryphosis: Secondary | ICD-10-CM | POA: Diagnosis not present

## 2023-04-10 DIAGNOSIS — L603 Nail dystrophy: Secondary | ICD-10-CM | POA: Diagnosis not present

## 2023-04-10 DIAGNOSIS — E1151 Type 2 diabetes mellitus with diabetic peripheral angiopathy without gangrene: Secondary | ICD-10-CM | POA: Diagnosis not present

## 2023-04-10 DIAGNOSIS — Z7984 Long term (current) use of oral hypoglycemic drugs: Secondary | ICD-10-CM | POA: Diagnosis not present

## 2023-04-16 DIAGNOSIS — E119 Type 2 diabetes mellitus without complications: Secondary | ICD-10-CM | POA: Diagnosis not present

## 2023-04-16 DIAGNOSIS — I10 Essential (primary) hypertension: Secondary | ICD-10-CM | POA: Diagnosis not present

## 2023-04-20 ENCOUNTER — Encounter: Payer: Self-pay | Admitting: Physician Assistant

## 2023-04-20 NOTE — Progress Notes (Signed)
 Advanced [facility] Failure [facility] Note  ERE:Czolijwip, Prashanthi, MD HF Cardioligst: Dr. Cherrie  HPI:  Mr. Ziomek is an 89 y.o.male with  DM2, HTN, HL, mild renal insufficiency, RAS s/p stenting of L renal artery in 2008, CAD s/p multiple PCIs.  Admitted 4/22 with left shoulder pain -> NSTEMI. Echo EF 45-50%, HK of basal mid anteroseptal wall. Cath  multivessel CAD with 85% proximal RCA, 85% mid RCA, 90% distal RCA, 80% mid LAD, 80% OM 2 lesion. Staged PCI with DES to the mid LAD, balloon angioplasty of left circumflex lesion, the diffusely diseased RCA was treated medically.  4/22 Zio - Sinus with 1AVB occasional Wenckebach and transient junctional rhythm   Admitted 1/23 with COVID. Echo EF 45 - 50% global HK. RV normal.  Mod-sev MR (previously mild).   Admitted 3/23 with ADHF  Echo EF 45-50%, RV mildly reduced, PASP 67.4 mmHg, moderate to severe MR, severe TR, mild to moderate AI. Also with epistaxis requiring anterior nasal packing. EP consulted due to Mobitz Type 1 with 3.4 second pauses. Recommended conservative management with no nodal blocking agents..   Seen in ED 07/22/21 with fall, hit head. CT head negative. Follow up 4/23, volume up, REDs  41%. Torsemide  increased to 40 bid x 2 days, then 60 mg daily.   Follow up 6/23, NYHA II-early III, volume stable on torsemide  40 mg. Qtc > 600 on ECG and SSRI stopped.  Follow up 5/24, doing ok. NYHA II-III and volume ok. Tolerating meds.  Acute visit 11/12/22 with increased SOB. Volume overloaded, weight up 10 lbs and REDs 40%. Torsemide  increased to 100 mg daily. Seen several weeks alter and ReDS still 40%. Now on torsemide  100/40  Today he returns for HF follow up. Still at [facility]. Says he has had increasing breath for the past month. In reviewing the [facility] has been getting torsemide  100 mg in am with very good response. But afternoon torsemide  was made prn and has not gotten that since 03/19/23. Mild LE edema. Gets around in [facility].  WEight up 11 pounds since his last visit here. No CP.  ROS: All systems reviewed and negative except as per HPI.   Past Medical History:  Diagnosis Date   Anemia due to chronic kidney disease    Bilateral inguinal hernia    CAD (coronary artery disease) cardiologist-  dr Johathon Overturf   PTCA of OM in 1998, BMS OM in 2000, Cath 9/07 LM ok LAD ok. LCX 95% in OM prior to previous stent RCA. nondominant normal EF normal. Cypher DES to OM 2007 (PLACED PROXIAMAL TO PREVIOUS STENT)   Chronic kidney disease, stage III (moderate) (HCC)    nephrologist-  dr detarding   Depression    Diabetes mellitus type 2, diet-controlled (HCC)    followed by pcp,  last A1c 5.2 on 09-10-2017 in epic   Gout, unspecified    10-29-2017  per pt stable   HLD (hyperlipidemia)    HTN (hypertension)    MGUS (monoclonal gammopathy of unknown significance) previously followed by dr freddie , arnetta and released 08/01/2011   IgG kappa dx 2002 8% plasma cells in bone marrow; no lesions on bone X-rays;   Nocturia    OA (osteoarthritis)    OSA on CPAP    per study 01-30-2004  severe osa , AHI 51.6/hr   PAD (peripheral artery disease) (HCC)    05-21-2006  left renal artery stenosis, s/p balloon angioplasty and stenting;  last duplex 02/ 2012  normal , arteries  patent   S/P coronary artery stent placement    2000--  BMS x1  to OM;   2007-- DES x1  to OM proximal to previous stent   Secondary hyperparathyroidism of renal origin [facility])    Urgency of urination    Wears glasses     Current Outpatient Medications  Medication Sig Dispense Refill   acetaminophen  (TYLENOL ) 500 MG tablet Take 500 mg by mouth every 6 (six) hours as needed.     allopurinol  (ZYLOPRIM ) 100 MG tablet Take 1 tablet (100 mg total) by mouth daily. 90 tablet 3   apixaban  (ELIQUIS ) 2.5 MG TABS tablet Take 2.5 mg by mouth 2 (two) times daily.     atorvastatin  (LIPITOR ) 80 MG tablet Take 1 tablet (80 mg total) by mouth daily. 90 tablet 3   cetirizine (ZYRTEC)  10 MG tablet Take 10 mg by mouth daily.     diclofenac  Sodium (VOLTAREN ) 1 % GEL Apply 2 g topically 3 (three) times daily.     empagliflozin  (JARDIANCE ) 10 MG TABS tablet Patient takes 1 tablet by mouth every other day.     ezetimibe  (ZETIA ) 10 MG tablet Take 10 mg by mouth daily.     ferrous sulfate 324 MG TBEC Take 324 mg by mouth.     levothyroxine  (SYNTHROID ) 25 MCG tablet Take 25 mcg by mouth daily before breakfast.     Menthol, Topical Analgesic, (BIOFREEZE) 4 % GEL Apply 1 application  topically in the morning and at bedtime. To right wrist/hand     Menthol, Topical Analgesic, (EUCERIN ITCH RELIEF) 0.1 % LOTN Apply 1 application  topically daily. To rash on chest/abdomin/back/neck/shoulder     Multiple Vitamin (MULTIVITAMIN) tablet Take 1 tablet by mouth daily.     pantoprazole  (PROTONIX ) 40 MG tablet Take 1 tablet (40 mg total) by mouth daily. 30 tablet 3   Petrolatum  Hydrophilic OINT Apply 1 application  topically at bedtime. To bilateral nares     potassium chloride  SA (KLOR-CON  M) 20 MEQ tablet Take 40 mEq by mouth daily.     Skin Protectants, Misc. (EUCERIN) cream Apply 1 Application topically daily.     sodium chloride  (OCEAN) 0.65 % SOLN nasal spray Place 1 spray into both nostrils daily.     tamsulosin  (FLOMAX ) 0.4 MG CAPS capsule Take 0.4 mg by mouth daily.     torsemide  (DEMADEX ) 20 MG tablet Take 100 mg by mouth daily. 40 mg as needed     White Petrolatum  (VASELINE EX) Apply 1 application  topically at bedtime. To bilateral nares     Zinc Oxide 25 % PSTE Apply 1 application  topically 2 (two) times daily. To bottom during incontinence care     No current facility-administered medications for this encounter.   Social History   Tobacco Use   Smoking status: Some Days    Current packs/day: 0.25    Types: Cigarettes   Smokeless tobacco: Never   Tobacco comments:    10-29-2017  per pt was smoking 1pp2d, stopped June 2019  Vaping Use   Vaping status: Never Used  Substance  Use Topics   Alcohol use: Not Currently   Drug use: No   Family History  Problem Relation Age of Onset   Heart disease Mother    Healthy Father    Heart disease Sister    Heart disease Sister    Cancer Sister        type unknown   Hypertension Sister    Diabetes Brother  Diabetes Brother    Diabetes Other    Coronary artery disease Other    Cancer Other    Wt Readings from Last 3 Encounters:  04/21/23 93.9 kg (207 lb)  02/02/23 89.2 kg (196 lb 9.6 oz)  12/10/22 86.5 kg (190 lb 12.8 oz)   BP 100/60   Pulse 80   Wt 93.9 kg (207 lb)   SpO2 97%   BMI 31.47 kg/m   PHYSICAL EXAM: General:  NAD. No resp difficulty, arrived in [facility], elderly HEENT: normal Neck: supple. JVP to ear  Carotids 2+ bilat; no bruits. No lymphadenopathy or thryomegaly appreciated. Cor: PMI nondisplaced. Irregular rate & rhythm. 2/6 MR Lungs: mild upper airway wheeze  Abdomen: soft, nontender, + distended. No hepatosplenomegaly. No bruits or masses. Good bowel sounds. Extremities: no cyanosis, clubbing, rash, 1+ edema Neuro: alert & orientedx3, cranial nerves grossly intact. moves all 4 extremities w/o difficulty. Affect pleasant  ReDs: 40% -> 45% today   ECG (personally reviewed): NSR 1AVB, PACs 65 bpm  ASSESSMENT & PLAN:  1. Chronic Diastolic Heart Failure/Ischemic CM - Echo (4/22): EF 45-50%  - Echo (3/23): EF 45-50%, LV mildly down with global LV HK, mild LVH, RV mildly reduced, severely elevated PASP 67.4 mmHg, moderate to severe MR, severe TR, mild to moderate AI. - Worse NYHA IIIB in setting of progressive volume overload - Volume status up in setting of not getting afternoon torsemide   - Will give one dose metolazone  2.5 and kcl 40 today - Continue torsemide  100. Then restart 40mg  po qpm (not PRN) - RTC in 2 weeks if volume still up would try weekly metoalzone - Continue 20 KCL daily. - Continue Jardiance  10 mg daily.  - No spiro w/ last SCr > 2. - No b-blocker due to HB. - No  ACE/ARB/ARNi with hx angioedema on ace and arb. - Labs today  2. CAD  - s/p NSTEMI 4/22.  - Cath 4/22 with multivessel CAD with 85% proximal RCA, 85% mid RCA, 90% distal RCA, 80% mid LAD, 80% OM 2 lesion. Staged PCI with DES to the mid LAD, balloon angioplasty of left circumflex lesion, the diffusely diseased RCA was treated medically. - No s/s angina - On DOAC - Continue statin. - Continue Jardiance .  3. Paroxysmal Atrial Flutter - Continue Eliquis  2.5 mg bid. - CHA2DS2-VASc Score = 5  - Zio (6/22): 39% burden  - Seen by EP during recent admit, not a good candidate for antiarrhythmic drugs. - Irregular on exam today. ECG ordered  4. Marked 1AVB with periods of Wenckebach - No beta blocker. - No current indication for PPM. - ECG pending  5. HTN - BP low today.  - Follow closely. May need midodrine  6. Aortic insufficiency/severe TR/moderate MR - Mild AI on echo 4/22 (stable from 2018) - Mild to moderate AI on echo 3/23. - Severe TR on echo 3/23 - Moderate to severe MR on echo 3/23 - Not mTEER candidate currently with lack of symptoms and with frailty/debility. - Repeat echo on next visit   7. CKD 3b - Baseline Scr ~2.0 - He is followed by [facility]. - On SGLT2i - Avoid hypotension - Labs today  8.T2DM - On SGLT2i.  - Recent A1C 5.6. - No GU symptoms  9. Physical deconditioning - Continue PT at [facility]. - Palliative is following at facility.  I spent a total of 40 minutes today: 1) reviewing the patient's medical records including previous charts, labs and recent notes from other providers; 2)  examining the patient and counseling them on their medical issues/explaining the plan of care; 3) adjusting meds as needed and 4) ordering lab work or other needed tests.     Toribio Fuel, MD  9:27 AM

## 2023-04-21 ENCOUNTER — Ambulatory Visit (HOSPITAL_COMMUNITY)
Admission: RE | Admit: 2023-04-21 | Discharge: 2023-04-21 | Disposition: A | Discharge status: Home / Self Care | Payer: Medicare HMO | Source: Ambulatory Visit | Attending: Internal Medicine | Admitting: Internal Medicine

## 2023-04-21 ENCOUNTER — Encounter (HOSPITAL_COMMUNITY): Payer: Self-pay | Admitting: Internal Medicine

## 2023-04-21 VITALS — BP 100/60 | HR 80 | Wt 207.0 lb

## 2023-04-21 DIAGNOSIS — E1122 Type 2 diabetes mellitus with diabetic chronic kidney disease: Secondary | ICD-10-CM | POA: Diagnosis not present

## 2023-04-21 DIAGNOSIS — I252 Old myocardial infarction: Secondary | ICD-10-CM | POA: Insufficient documentation

## 2023-04-21 DIAGNOSIS — N183 Chronic kidney disease, stage 3 unspecified: Secondary | ICD-10-CM

## 2023-04-21 DIAGNOSIS — I082 Rheumatic disorders of both aortic and tricuspid valves: Secondary | ICD-10-CM | POA: Diagnosis not present

## 2023-04-21 DIAGNOSIS — I441 Atrioventricular block, second degree: Secondary | ICD-10-CM | POA: Insufficient documentation

## 2023-04-21 DIAGNOSIS — Z955 Presence of coronary angioplasty implant and graft: Secondary | ICD-10-CM | POA: Insufficient documentation

## 2023-04-21 DIAGNOSIS — Z7984 Long term (current) use of oral hypoglycemic drugs: Secondary | ICD-10-CM | POA: Diagnosis not present

## 2023-04-21 DIAGNOSIS — I5032 Chronic diastolic (congestive) heart failure: Secondary | ICD-10-CM | POA: Insufficient documentation

## 2023-04-21 DIAGNOSIS — I130 Hypertensive heart and chronic kidney disease with heart failure and stage 1 through stage 4 chronic kidney disease, or unspecified chronic kidney disease: Secondary | ICD-10-CM | POA: Diagnosis not present

## 2023-04-21 DIAGNOSIS — Z7901 Long term (current) use of anticoagulants: Secondary | ICD-10-CM | POA: Insufficient documentation

## 2023-04-21 DIAGNOSIS — N1832 Chronic kidney disease, stage 3b: Secondary | ICD-10-CM | POA: Diagnosis not present

## 2023-04-21 DIAGNOSIS — I255 Ischemic cardiomyopathy: Secondary | ICD-10-CM | POA: Diagnosis not present

## 2023-04-21 DIAGNOSIS — I10 Essential (primary) hypertension: Secondary | ICD-10-CM | POA: Diagnosis not present

## 2023-04-21 DIAGNOSIS — I4892 Unspecified atrial flutter: Secondary | ICD-10-CM | POA: Diagnosis not present

## 2023-04-21 DIAGNOSIS — E1151 Type 2 diabetes mellitus with diabetic peripheral angiopathy without gangrene: Secondary | ICD-10-CM | POA: Diagnosis not present

## 2023-04-21 DIAGNOSIS — I2510 Atherosclerotic heart disease of native coronary artery without angina pectoris: Secondary | ICD-10-CM | POA: Insufficient documentation

## 2023-04-21 LAB — BASIC METABOLIC PANEL
Anion gap: 10 (ref 5–15)
BUN: 51 mg/dL — ABNORMAL HIGH (ref 8–23)
CO2: 21 mmol/L — ABNORMAL LOW (ref 22–32)
Calcium: 9.2 mg/dL (ref 8.9–10.3)
Chloride: 107 mmol/L (ref 98–111)
Creatinine, Ser: 2.86 mg/dL — ABNORMAL HIGH (ref 0.61–1.24)
GFR, Estimated: 20 mL/min — ABNORMAL LOW (ref >60)
Glucose, Bld: 116 mg/dL — ABNORMAL HIGH (ref 70–99)
Potassium: 4.3 mmol/L (ref 3.5–5.1)
Sodium: 138 mmol/L (ref 135–145)

## 2023-04-21 LAB — BRAIN NATRIURETIC PEPTIDE: B Natriuretic Peptide: 424.1 pg/mL — ABNORMAL HIGH (ref 0.0–100.0)

## 2023-04-21 MED ORDER — METOLAZONE 2.5 MG PO TABS: 2.5000 mg | ORAL_TABLET | Freq: Once | ORAL | Status: DC

## 2023-04-21 MED ORDER — TORSEMIDE 20 MG PO TABS: ORAL_TABLET | ORAL | Status: DC

## 2023-04-21 NOTE — Addendum Note (Signed)
 Encounter addended by: Baird Cancer, RN on: 04/21/2023 10:02 AM  Actions taken: Order list changed, Diagnosis association updated

## 2023-04-21 NOTE — Progress Notes (Signed)
 ReDS Vest / Clip - 04/21/23 0900       ReDS Vest / Clip   Station Marker C    Ruler Value 31    ReDS Value Range High volume overload    ReDS Actual Value 45

## 2023-04-21 NOTE — Patient Instructions (Addendum)
 Medication Changes:  1.) TAKE METOLAZONE  2.5MG  PO FOR ONE DOSE TODAY WITH POTASSIUM 40MEQ PO FOR ONE DOSE TODAY   2.) CONTINUE TORSEMIDE  100MG  PO EVERY MORNING   3.) GIVE TORSEMIDE  40MG   PO EVERYDAY AT 3PM- EVERYDAY NOT PRN   4.) FOLLOW UP 2 WEEKS as scheduled WITH ECHO   Labs done today, your results will be available in MyChart, we will contact you for abnormal readings.  At the [facility], you and your health needs are our priority. We have a designated team specialized in the treatment of Heart Failure. This Care Team includes your primary Heart Failure Specialized Cardiologist (physician), Advanced Practice Providers (APPs- Physician Assistants and Nurse Practitioners), and Pharmacist who all work together to provide you with the care you need, when you need it.   You may see any of the following providers on your designated Care Team at your next follow up:  Dr. Toribio Fuel Dr. Ezra Shuck Dr. Ria Commander Dr. Odis Brownie Greig Mosses, NP Caffie Shed, [place] [facility] [place], [place] Beckey Coe, NP Jordan Lee, NP Tinnie Redman, PharmD   Please be sure to bring in all your medications bottles to every appointment.   Need to Contact Us :  If you have any questions or concerns before your next appointment please send us  a message through [facility] or call our office at [phone].    TO LEAVE A MESSAGE FOR THE NURSE SELECT OPTION 2, PLEASE LEAVE A MESSAGE INCLUDING: YOUR NAME DATE OF BIRTH CALL BACK NUMBER REASON FOR CALL**this is important as we prioritize the call backs  YOU WILL RECEIVE A CALL BACK THE SAME DAY AS LONG AS YOU CALL BEFORE 4:00 PM

## 2023-04-22 DIAGNOSIS — I5042 Chronic combined systolic (congestive) and diastolic (congestive) heart failure: Secondary | ICD-10-CM | POA: Diagnosis not present

## 2023-04-22 DIAGNOSIS — I4892 Unspecified atrial flutter: Secondary | ICD-10-CM | POA: Diagnosis not present

## 2023-04-22 DIAGNOSIS — Z7901 Long term (current) use of anticoagulants: Secondary | ICD-10-CM | POA: Diagnosis not present

## 2023-04-22 DIAGNOSIS — N184 Chronic kidney disease, stage 4 (severe): Secondary | ICD-10-CM | POA: Diagnosis not present

## 2023-04-22 DIAGNOSIS — Z7984 Long term (current) use of oral hypoglycemic drugs: Secondary | ICD-10-CM | POA: Diagnosis not present

## 2023-04-22 DIAGNOSIS — D631 Anemia in chronic kidney disease: Secondary | ICD-10-CM | POA: Diagnosis not present

## 2023-04-22 DIAGNOSIS — E1122 Type 2 diabetes mellitus with diabetic chronic kidney disease: Secondary | ICD-10-CM | POA: Diagnosis not present

## 2023-04-22 DIAGNOSIS — E785 Hyperlipidemia, unspecified: Secondary | ICD-10-CM | POA: Diagnosis not present

## 2023-04-23 DIAGNOSIS — R2681 Unsteadiness on feet: Secondary | ICD-10-CM | POA: Diagnosis not present

## 2023-04-23 DIAGNOSIS — M6281 Muscle weakness (generalized): Secondary | ICD-10-CM | POA: Diagnosis not present

## 2023-04-23 DIAGNOSIS — I5043 Acute on chronic combined systolic (congestive) and diastolic (congestive) heart failure: Secondary | ICD-10-CM | POA: Diagnosis not present

## 2023-04-27 DIAGNOSIS — I5043 Acute on chronic combined systolic (congestive) and diastolic (congestive) heart failure: Secondary | ICD-10-CM | POA: Diagnosis not present

## 2023-04-27 DIAGNOSIS — M6281 Muscle weakness (generalized): Secondary | ICD-10-CM | POA: Diagnosis not present

## 2023-04-27 DIAGNOSIS — R059 Cough, unspecified: Secondary | ICD-10-CM | POA: Diagnosis not present

## 2023-04-27 DIAGNOSIS — R2681 Unsteadiness on feet: Secondary | ICD-10-CM | POA: Diagnosis not present

## 2023-04-28 DIAGNOSIS — I5043 Acute on chronic combined systolic (congestive) and diastolic (congestive) heart failure: Secondary | ICD-10-CM | POA: Diagnosis not present

## 2023-04-28 DIAGNOSIS — M6281 Muscle weakness (generalized): Secondary | ICD-10-CM | POA: Diagnosis not present

## 2023-04-28 DIAGNOSIS — R2681 Unsteadiness on feet: Secondary | ICD-10-CM | POA: Diagnosis not present

## 2023-04-29 DIAGNOSIS — R2681 Unsteadiness on feet: Secondary | ICD-10-CM | POA: Diagnosis not present

## 2023-04-29 DIAGNOSIS — M6281 Muscle weakness (generalized): Secondary | ICD-10-CM | POA: Diagnosis not present

## 2023-04-29 DIAGNOSIS — I5043 Acute on chronic combined systolic (congestive) and diastolic (congestive) heart failure: Secondary | ICD-10-CM | POA: Diagnosis not present

## 2023-04-30 DIAGNOSIS — M6281 Muscle weakness (generalized): Secondary | ICD-10-CM | POA: Diagnosis not present

## 2023-04-30 DIAGNOSIS — R2681 Unsteadiness on feet: Secondary | ICD-10-CM | POA: Diagnosis not present

## 2023-04-30 DIAGNOSIS — I5043 Acute on chronic combined systolic (congestive) and diastolic (congestive) heart failure: Secondary | ICD-10-CM | POA: Diagnosis not present

## 2023-05-04 DIAGNOSIS — M6281 Muscle weakness (generalized): Secondary | ICD-10-CM | POA: Diagnosis not present

## 2023-05-04 DIAGNOSIS — I5043 Acute on chronic combined systolic (congestive) and diastolic (congestive) heart failure: Secondary | ICD-10-CM | POA: Diagnosis not present

## 2023-05-04 DIAGNOSIS — R2681 Unsteadiness on feet: Secondary | ICD-10-CM | POA: Diagnosis not present

## 2023-05-05 DIAGNOSIS — I5043 Acute on chronic combined systolic (congestive) and diastolic (congestive) heart failure: Secondary | ICD-10-CM | POA: Diagnosis not present

## 2023-05-05 DIAGNOSIS — M6281 Muscle weakness (generalized): Secondary | ICD-10-CM | POA: Diagnosis not present

## 2023-05-05 DIAGNOSIS — R2681 Unsteadiness on feet: Secondary | ICD-10-CM | POA: Diagnosis not present

## 2023-05-06 DIAGNOSIS — I5043 Acute on chronic combined systolic (congestive) and diastolic (congestive) heart failure: Secondary | ICD-10-CM | POA: Diagnosis not present

## 2023-05-06 DIAGNOSIS — M6281 Muscle weakness (generalized): Secondary | ICD-10-CM | POA: Diagnosis not present

## 2023-05-06 DIAGNOSIS — R2681 Unsteadiness on feet: Secondary | ICD-10-CM | POA: Diagnosis not present

## 2023-05-07 DIAGNOSIS — M6281 Muscle weakness (generalized): Secondary | ICD-10-CM | POA: Diagnosis not present

## 2023-05-07 DIAGNOSIS — R2681 Unsteadiness on feet: Secondary | ICD-10-CM | POA: Diagnosis not present

## 2023-05-07 DIAGNOSIS — I5043 Acute on chronic combined systolic (congestive) and diastolic (congestive) heart failure: Secondary | ICD-10-CM | POA: Diagnosis not present

## 2023-05-08 DIAGNOSIS — R2681 Unsteadiness on feet: Secondary | ICD-10-CM | POA: Diagnosis not present

## 2023-05-08 DIAGNOSIS — I5043 Acute on chronic combined systolic (congestive) and diastolic (congestive) heart failure: Secondary | ICD-10-CM | POA: Diagnosis not present

## 2023-05-08 DIAGNOSIS — M6281 Muscle weakness (generalized): Secondary | ICD-10-CM | POA: Diagnosis not present

## 2023-05-09 DIAGNOSIS — I5043 Acute on chronic combined systolic (congestive) and diastolic (congestive) heart failure: Secondary | ICD-10-CM | POA: Diagnosis not present

## 2023-05-09 DIAGNOSIS — R2681 Unsteadiness on feet: Secondary | ICD-10-CM | POA: Diagnosis not present

## 2023-05-09 DIAGNOSIS — M6281 Muscle weakness (generalized): Secondary | ICD-10-CM | POA: Diagnosis not present

## 2023-05-11 DIAGNOSIS — I5043 Acute on chronic combined systolic (congestive) and diastolic (congestive) heart failure: Secondary | ICD-10-CM | POA: Diagnosis not present

## 2023-05-11 DIAGNOSIS — R2681 Unsteadiness on feet: Secondary | ICD-10-CM | POA: Diagnosis not present

## 2023-05-11 DIAGNOSIS — M6281 Muscle weakness (generalized): Secondary | ICD-10-CM | POA: Diagnosis not present

## 2023-05-12 DIAGNOSIS — I5043 Acute on chronic combined systolic (congestive) and diastolic (congestive) heart failure: Secondary | ICD-10-CM | POA: Diagnosis not present

## 2023-05-12 DIAGNOSIS — M6281 Muscle weakness (generalized): Secondary | ICD-10-CM | POA: Diagnosis not present

## 2023-05-12 DIAGNOSIS — R2681 Unsteadiness on feet: Secondary | ICD-10-CM | POA: Diagnosis not present

## 2023-05-14 ENCOUNTER — Telehealth (HOSPITAL_COMMUNITY): Payer: Self-pay | Admitting: *Deleted

## 2023-05-14 ENCOUNTER — Ambulatory Visit (HOSPITAL_BASED_OUTPATIENT_CLINIC_OR_DEPARTMENT_OTHER)
Admission: RE | Admit: 2023-05-14 | Discharge: 2023-05-14 | Disposition: A | Discharge status: Home / Self Care | Payer: Medicare HMO | Source: Ambulatory Visit | Attending: *Deleted | Admitting: *Deleted

## 2023-05-14 ENCOUNTER — Ambulatory Visit (HOSPITAL_COMMUNITY)
Admission: RE | Admit: 2023-05-14 | Discharge: 2023-05-14 | Disposition: A | Discharge status: Home / Self Care | Payer: Medicare HMO | Source: Ambulatory Visit | Attending: Sports Medicine | Admitting: Sports Medicine

## 2023-05-14 ENCOUNTER — Encounter (HOSPITAL_COMMUNITY): Payer: Self-pay

## 2023-05-14 VITALS — BP 130/66 | HR 72 | Wt 197.0 lb

## 2023-05-14 DIAGNOSIS — E1122 Type 2 diabetes mellitus with diabetic chronic kidney disease: Secondary | ICD-10-CM | POA: Insufficient documentation

## 2023-05-14 DIAGNOSIS — N183 Chronic kidney disease, stage 3 unspecified: Secondary | ICD-10-CM | POA: Diagnosis not present

## 2023-05-14 DIAGNOSIS — R0602 Shortness of breath: Secondary | ICD-10-CM | POA: Insufficient documentation

## 2023-05-14 DIAGNOSIS — D631 Anemia in chronic kidney disease: Secondary | ICD-10-CM | POA: Diagnosis not present

## 2023-05-14 DIAGNOSIS — I5032 Chronic diastolic (congestive) heart failure: Secondary | ICD-10-CM | POA: Diagnosis not present

## 2023-05-14 DIAGNOSIS — I2510 Atherosclerotic heart disease of native coronary artery without angina pectoris: Secondary | ICD-10-CM

## 2023-05-14 DIAGNOSIS — I130 Hypertensive heart and chronic kidney disease with heart failure and stage 1 through stage 4 chronic kidney disease, or unspecified chronic kidney disease: Secondary | ICD-10-CM | POA: Diagnosis not present

## 2023-05-14 DIAGNOSIS — N1832 Chronic kidney disease, stage 3b: Secondary | ICD-10-CM | POA: Insufficient documentation

## 2023-05-14 DIAGNOSIS — I083 Combined rheumatic disorders of mitral, aortic and tricuspid valves: Secondary | ICD-10-CM | POA: Insufficient documentation

## 2023-05-14 DIAGNOSIS — Z7984 Long term (current) use of oral hypoglycemic drugs: Secondary | ICD-10-CM | POA: Diagnosis not present

## 2023-05-14 DIAGNOSIS — Z955 Presence of coronary angioplasty implant and graft: Secondary | ICD-10-CM | POA: Insufficient documentation

## 2023-05-14 DIAGNOSIS — I252 Old myocardial infarction: Secondary | ICD-10-CM | POA: Diagnosis not present

## 2023-05-14 LAB — BASIC METABOLIC PANEL
Anion gap: 16 — ABNORMAL HIGH (ref 5–15)
BUN: 67 mg/dL — ABNORMAL HIGH (ref 8–23)
CO2: 22 mmol/L (ref 22–32)
Calcium: 9.6 mg/dL (ref 8.9–10.3)
Chloride: 101 mmol/L (ref 98–111)
Creatinine, Ser: 3.13 mg/dL — ABNORMAL HIGH (ref 0.61–1.24)
GFR, Estimated: 18 mL/min — ABNORMAL LOW (ref >60)
Glucose, Bld: 93 mg/dL (ref 70–99)
Potassium: 4 mmol/L (ref 3.5–5.1)
Sodium: 139 mmol/L (ref 135–145)

## 2023-05-14 LAB — ECHOCARDIOGRAM COMPLETE
AR max vel: 1.33 cm2
AV Area VTI: 1.53 cm2
AV Area mean vel: 1.32 cm2
AV Mean grad: 7 mm[Hg]
AV Peak grad: 14 mm[Hg]
Ao pk vel: 1.87 m/s
Area-P 1/2: 3.34 cm2
Calc EF: 55.9 %
MV M vel: 4.86 m/s
MV Peak grad: 94.5 mm[Hg]
MV VTI: 1.34 cm2
P 1/2 time: 797 ms
Radius: 0.7 cm
S' Lateral: 3.7 cm
Single Plane A2C EF: 55.8 %
Single Plane A4C EF: 53.4 %

## 2023-05-14 MED ORDER — TORSEMIDE 20 MG PO TABS: ORAL_TABLET | ORAL | Status: DC

## 2023-05-14 NOTE — Progress Notes (Signed)
ReDS Vest / Clip - 05/14/23 1200       ReDS Vest / Clip   Station Marker C    Ruler Value 23    ReDS Value Range Low volume    ReDS Actual Value 50

## 2023-05-14 NOTE — Telephone Encounter (Signed)
[facility] facility per Tonye Becket, NP with following lab results and instructions:  "Renal function trending up. Hold torsemide x 1 day. Then restart torsemide 100 mg in am and cut back evening torsemide 20 mg daily. Please call [facility]. He will need BMEt in 7 days."  Copy of lab results and written orders as above faxed to:      [employer]       Phone: [phone]       Fax: [phone]

## 2023-05-14 NOTE — Patient Instructions (Addendum)
No change in medications. Labs today - will call you if abnbormal. Return to see Dr. Gala Romney in 3 months. Please call us at [phone] in March to schedule this appointment. Please call us at [phone] if any questions or concerns prior to your next visit.

## 2023-05-14 NOTE — Progress Notes (Signed)
Advanced Heart Failure Clinic Note  WUJ:WJXBJYNWG, Prashanthi, MD HF Cardioligst: Dr. Gala Romney  HPI: [name] is an 89 y.o.male with  DM2, HTN, HL, mild renal insufficiency, RAS s/p stenting of L renal artery in 2008, CAD s/p multiple PCIs.  Admitted 4/22 with left shoulder pain -> NSTEMI. Echo EF 45-50%, HK of basal mid anteroseptal wall. Cath  multivessel CAD with 85% proximal RCA, 85% mid RCA, 90% distal RCA, 80% mid LAD, 80% OM 2 lesion. Staged PCI with DES to the mid LAD, balloon angioplasty of left circumflex lesion, the diffusely diseased RCA was treated medically.  4/22 Zio - Sinus with 1AVB occasional Wenckebach and transient junctional rhythm   Admitted 1/23 with COVID. Echo EF 45 - 50% global HK. RV normal.  Mod-sev MR (previously mild).   Admitted 3/23 with ADHF  Echo EF 45-50%, RV mildly reduced, PASP 67.4 mmHg, moderate to severe MR, severe TR, mild to moderate AI. Also with epistaxis requiring anterior nasal packing. EP consulted due to Mobitz Type 1 with 3.4 second pauses. Recommended conservative management with no nodal blocking agents..   Seen in ED 07/22/21 with fall, hit head. CT head negative. Follow up 4/23, volume up, REDs  41%. Torsemide increased to 40 bid x 2 days, then 60 mg daily.   Follow up 6/23, NYHA II-early III, volume stable on torsemide 40 mg. Qtc > 600 on ECG and SSRI stopped.  Acute visit 11/12/22 with increased SOB. Volume overloaded, weight up 10 lbs and REDs 40%. Torsemide increased to 100 mg daily. Seen several weeks alter and ReDS still 40%.   HF Visit 04/21/23. Volume overloaded. Torsemide was increased 100 mg/40 mg.    Today he returns for HF follow up.Overall feeling fine. Only gets short of breath when he is trying to work with therapy. Can take a few steps. Denies PND/Orthopnea. No chest pain. Appetite ok. No fever or chills. Weight at SNF has been trending down 201-->197 pounds. Taking all medications.He remains at [facility].  ROS: All  systems reviewed and negative except as per HPI.   Past Medical History:  Diagnosis Date   Anemia due to chronic kidney disease    Bilateral inguinal hernia    CAD (coronary artery disease) cardiologist-  dr bensimhon   PTCA of OM in 1998, BMS OM in 2000, Cath 9/07 LM ok LAD ok. LCX 95% in OM prior to previous stent RCA. nondominant normal EF normal. Cypher DES to OM 2007 (PLACED PROXIAMAL TO PREVIOUS STENT)   Chronic kidney disease, stage III (moderate) (HCC)    nephrologist-  dr detarding   Depression    Diabetes mellitus type 2, diet-controlled (HCC)    followed by pcp,  last A1c 5.2 on 09-10-2017 in epic   Gout, unspecified    10-29-2017  per pt stable   HLD (hyperlipidemia)    HTN (hypertension)    MGUS (monoclonal gammopathy of unknown significance) previously followed by dr Cyndie Chime , Theron Arista and released 08/01/2011   IgG kappa dx 2002 8% plasma cells in bone marrow; no lesions on bone X-rays;   Nocturia    OA (osteoarthritis)    OSA on CPAP    per study 01-30-2004  severe osa , AHI 51.6/hr   PAD (peripheral artery disease) (HCC)    05-21-2006  left renal artery stenosis, s/p balloon angioplasty and stenting;  last duplex 02/ 2012  normal , arteries patent   S/P coronary artery stent placement    2000--  BMS x1  to  OM;   2007-- DES x1  to OM proximal to previous stent   Secondary hyperparathyroidism of renal origin [facility])    Urgency of urination    Wears glasses     Current Outpatient Medications  Medication Sig Dispense Refill   acetaminophen (TYLENOL) 500 MG tablet Take 500 mg by mouth every 6 (six) hours as needed.     allopurinol (ZYLOPRIM) 100 MG tablet Take 1 tablet (100 mg total) by mouth daily. 90 tablet 3   apixaban (ELIQUIS) 2.5 MG TABS tablet Take 2.5 mg by mouth 2 (two) times daily.     atorvastatin (LIPITOR) 80 MG tablet Take 1 tablet (80 mg total) by mouth daily. 90 tablet 3   cetirizine (ZYRTEC) 10 MG tablet Take 10 mg by mouth daily.     diclofenac Sodium  (VOLTAREN) 1 % GEL Apply 2 g topically 3 (three) times daily.     empagliflozin (JARDIANCE) 10 MG TABS tablet Patient takes 1 tablet by mouth every other day.     ezetimibe (ZETIA) 10 MG tablet Take 10 mg by mouth daily.     ferrous sulfate 324 MG TBEC Take 324 mg by mouth.     levothyroxine (SYNTHROID) 25 MCG tablet Take 25 mcg by mouth daily before breakfast.     Menthol, Topical Analgesic, (BIOFREEZE) 4 % GEL Apply 1 application  topically in the morning and at bedtime. To right wrist/hand     Menthol, Topical Analgesic, (EUCERIN ITCH RELIEF) 0.1 % LOTN Apply 1 application  topically daily. To rash on chest/abdomin/back/neck/shoulder     Multiple Vitamin (MULTIVITAMIN) tablet Take 1 tablet by mouth daily.     pantoprazole (PROTONIX) 40 MG tablet Take 1 tablet (40 mg total) by mouth daily. 30 tablet 3   Petrolatum Hydrophilic OINT Apply 1 application  topically at bedtime. To bilateral nares     potassium chloride SA (KLOR-CON M) 20 MEQ tablet Take 40 mEq by mouth daily.     Skin Protectants, Misc. (EUCERIN) cream Apply 1 Application topically daily.     sodium chloride (OCEAN) 0.65 % SOLN nasal spray Place 1 spray into both nostrils daily.     tamsulosin (FLOMAX) 0.4 MG CAPS capsule Take 0.4 mg by mouth daily.     torsemide (DEMADEX) 20 MG tablet Take torsemide 100mg  every morning, and 40mg  every afternoon at 3pm     White Petrolatum (VASELINE EX) Apply 1 application  topically at bedtime. To bilateral nares     Zinc Oxide 25 % PSTE Apply 1 application  topically 2 (two) times daily. To bottom during incontinence care     No current facility-administered medications for this encounter.   Social History   Tobacco Use   Smoking status: Some Days    Current packs/day: 0.25    Types: Cigarettes   Smokeless tobacco: Never   Tobacco comments:    10-29-2017  per pt was smoking 1pp2d, stopped June 2019  Vaping Use   Vaping status: Never Used  Substance Use Topics   Alcohol use: Not  Currently   Drug use: No   Family History  Problem Relation Age of Onset   Heart disease Mother    Healthy Father    Heart disease Sister    Heart disease Sister    Cancer Sister        type unknown   Hypertension Sister    Diabetes Brother    Diabetes Brother    Diabetes Other    Coronary artery disease Other  Cancer Other    Wt Readings from Last 3 Encounters:  05/14/23 89.4 kg (197 lb)  04/21/23 93.9 kg (207 lb)  02/02/23 89.2 kg (196 lb 9.6 oz)   BP 130/66   Pulse 72   Wt 89.4 kg (197 lb) Comment: Patient weighed yesterday in the facility.  SpO2 97%   BMI 29.95 kg/m   PHYSICAL EXAM: General: Arrived in a wheelchair. Well appearing. No resp difficulty HEENT: normal Neck: supple. no JVD. Carotids 2+ bilat; no bruits. No lymphadenopathy or thryomegaly appreciated. Cor: PMI nondisplaced. Regular rate & rhythm. No rubs, gallops or murmurs. Lungs: clear Abdomen: soft, nontender, nondistended. No hepatosplenomegaly. No bruits or masses. Good bowel sounds. Extremities: no cyanosis, clubbing, rash, edema Neuro: alert & orientedx3, cranial nerves grossly intact. moves all 4 extremities w/o difficulty. Affect pleasant    ASSESSMENT & PLAN:  1. Chronic Diastolic Heart Failure/Ischemic CM - Echo (4/22): EF 45-50%  - Echo (3/23): EF 45-50%, LV mildly down with global LV HK, mild LVH, RV mildly reduced, severely elevated PASP 67.4 mmHg, moderate to severe MR, severe TR, mild to moderate AI. NYHA III.  ReDs reading: 50 %, abnormal. Reds elevated but does not appear volume overloaded on exam therefore no change in diuretic regimen.  Continue torsemide 100 mg in am and  40mg  po qpm   Continue 40 meq KCL daily. - Continue Jardiance 10 mg daily.  - No spiro w/ last SCr > 2. - No b-blocker due to HB. - No ACE/ARB/ARNi with hx angioedema on ace and arb. - Check BMET   2. CAD  - s/p NSTEMI 4/22.  - Cath 4/22 with multivessel CAD with 85% proximal RCA, 85% mid RCA, 90% distal  RCA, 80% mid LAD, 80% OM 2 lesion. Staged PCI with DES to the mid LAD, balloon angioplasty of left circumflex lesion, the diffusely diseased RCA was treated medically. - No chest pain. - On DOAC - Continue statin. - Continue Jardiance 10 mg daily .  3. Paroxysmal Atrial Flutter - Continue Eliquis 2.5 mg bid. - CHA2DS2-VASc Score = 5  - Zio (6/22): 39% burden  -  4. HTN Stable. No change.   5. Aortic insufficiency/severe TR/moderate MR - Mild AI on echo 4/22 (stable from 2018) - Mild to moderate AI on echo 3/23. - Severe TR on echo 3/23 - Moderate to severe MR on echo 3/23 - Not mTEER candidate currently with lack of symptoms and with frailty/debility.  6. CKD 3b - Baseline Scr ~2.0 -  On SGLT2i - Check BMET   Follow up in 3-4 months with Dr Gala Romney.    Kevin Becket, NP  12:13 PM

## 2023-05-18 DIAGNOSIS — J101 Influenza due to other identified influenza virus with other respiratory manifestations: Secondary | ICD-10-CM | POA: Diagnosis not present

## 2023-05-18 DIAGNOSIS — M6281 Muscle weakness (generalized): Secondary | ICD-10-CM | POA: Diagnosis not present

## 2023-05-18 DIAGNOSIS — I5043 Acute on chronic combined systolic (congestive) and diastolic (congestive) heart failure: Secondary | ICD-10-CM | POA: Diagnosis not present

## 2023-05-18 DIAGNOSIS — Z20828 Contact with and (suspected) exposure to other viral communicable diseases: Secondary | ICD-10-CM | POA: Diagnosis not present

## 2023-05-18 DIAGNOSIS — R2681 Unsteadiness on feet: Secondary | ICD-10-CM | POA: Diagnosis not present

## 2023-05-19 DIAGNOSIS — R2681 Unsteadiness on feet: Secondary | ICD-10-CM | POA: Diagnosis not present

## 2023-05-19 DIAGNOSIS — I5043 Acute on chronic combined systolic (congestive) and diastolic (congestive) heart failure: Secondary | ICD-10-CM | POA: Diagnosis not present

## 2023-05-19 DIAGNOSIS — M6281 Muscle weakness (generalized): Secondary | ICD-10-CM | POA: Diagnosis not present

## 2023-05-20 DIAGNOSIS — R2681 Unsteadiness on feet: Secondary | ICD-10-CM | POA: Diagnosis not present

## 2023-05-20 DIAGNOSIS — M6281 Muscle weakness (generalized): Secondary | ICD-10-CM | POA: Diagnosis not present

## 2023-05-20 DIAGNOSIS — I5043 Acute on chronic combined systolic (congestive) and diastolic (congestive) heart failure: Secondary | ICD-10-CM | POA: Diagnosis not present

## 2023-05-21 DIAGNOSIS — I5043 Acute on chronic combined systolic (congestive) and diastolic (congestive) heart failure: Secondary | ICD-10-CM | POA: Diagnosis not present

## 2023-05-21 DIAGNOSIS — R2681 Unsteadiness on feet: Secondary | ICD-10-CM | POA: Diagnosis not present

## 2023-05-21 DIAGNOSIS — M6281 Muscle weakness (generalized): Secondary | ICD-10-CM | POA: Diagnosis not present

## 2023-05-21 DIAGNOSIS — I10 Essential (primary) hypertension: Secondary | ICD-10-CM | POA: Diagnosis not present

## 2023-05-25 DIAGNOSIS — M6281 Muscle weakness (generalized): Secondary | ICD-10-CM | POA: Diagnosis not present

## 2023-05-25 DIAGNOSIS — I5043 Acute on chronic combined systolic (congestive) and diastolic (congestive) heart failure: Secondary | ICD-10-CM | POA: Diagnosis not present

## 2023-05-25 DIAGNOSIS — R2681 Unsteadiness on feet: Secondary | ICD-10-CM | POA: Diagnosis not present

## 2023-05-26 DIAGNOSIS — R2681 Unsteadiness on feet: Secondary | ICD-10-CM | POA: Diagnosis not present

## 2023-05-26 DIAGNOSIS — I5043 Acute on chronic combined systolic (congestive) and diastolic (congestive) heart failure: Secondary | ICD-10-CM | POA: Diagnosis not present

## 2023-05-26 DIAGNOSIS — M6281 Muscle weakness (generalized): Secondary | ICD-10-CM | POA: Diagnosis not present

## 2023-05-27 DIAGNOSIS — I5043 Acute on chronic combined systolic (congestive) and diastolic (congestive) heart failure: Secondary | ICD-10-CM | POA: Diagnosis not present

## 2023-05-27 DIAGNOSIS — M6281 Muscle weakness (generalized): Secondary | ICD-10-CM | POA: Diagnosis not present

## 2023-05-27 DIAGNOSIS — R2681 Unsteadiness on feet: Secondary | ICD-10-CM | POA: Diagnosis not present

## 2023-05-28 DIAGNOSIS — R2681 Unsteadiness on feet: Secondary | ICD-10-CM | POA: Diagnosis not present

## 2023-05-28 DIAGNOSIS — M6281 Muscle weakness (generalized): Secondary | ICD-10-CM | POA: Diagnosis not present

## 2023-05-28 DIAGNOSIS — H2513 Age-related nuclear cataract, bilateral: Secondary | ICD-10-CM | POA: Diagnosis not present

## 2023-05-28 DIAGNOSIS — I5043 Acute on chronic combined systolic (congestive) and diastolic (congestive) heart failure: Secondary | ICD-10-CM | POA: Diagnosis not present

## 2023-05-28 DIAGNOSIS — H40023 Open angle with borderline findings, high risk, bilateral: Secondary | ICD-10-CM | POA: Diagnosis not present

## 2023-05-30 DIAGNOSIS — R2681 Unsteadiness on feet: Secondary | ICD-10-CM | POA: Diagnosis not present

## 2023-05-30 DIAGNOSIS — M6281 Muscle weakness (generalized): Secondary | ICD-10-CM | POA: Diagnosis not present

## 2023-05-30 DIAGNOSIS — I5043 Acute on chronic combined systolic (congestive) and diastolic (congestive) heart failure: Secondary | ICD-10-CM | POA: Diagnosis not present

## 2023-06-01 DIAGNOSIS — R2681 Unsteadiness on feet: Secondary | ICD-10-CM | POA: Diagnosis not present

## 2023-06-01 DIAGNOSIS — I5043 Acute on chronic combined systolic (congestive) and diastolic (congestive) heart failure: Secondary | ICD-10-CM | POA: Diagnosis not present

## 2023-06-01 DIAGNOSIS — M6281 Muscle weakness (generalized): Secondary | ICD-10-CM | POA: Diagnosis not present

## 2023-06-02 DIAGNOSIS — M6281 Muscle weakness (generalized): Secondary | ICD-10-CM | POA: Diagnosis not present

## 2023-06-02 DIAGNOSIS — R2681 Unsteadiness on feet: Secondary | ICD-10-CM | POA: Diagnosis not present

## 2023-06-02 DIAGNOSIS — I5043 Acute on chronic combined systolic (congestive) and diastolic (congestive) heart failure: Secondary | ICD-10-CM | POA: Diagnosis not present

## 2023-06-08 DIAGNOSIS — M6281 Muscle weakness (generalized): Secondary | ICD-10-CM | POA: Diagnosis not present

## 2023-06-08 DIAGNOSIS — R2681 Unsteadiness on feet: Secondary | ICD-10-CM | POA: Diagnosis not present

## 2023-06-08 DIAGNOSIS — I5043 Acute on chronic combined systolic (congestive) and diastolic (congestive) heart failure: Secondary | ICD-10-CM | POA: Diagnosis not present

## 2023-06-09 DIAGNOSIS — M6281 Muscle weakness (generalized): Secondary | ICD-10-CM | POA: Diagnosis not present

## 2023-06-09 DIAGNOSIS — I5043 Acute on chronic combined systolic (congestive) and diastolic (congestive) heart failure: Secondary | ICD-10-CM | POA: Diagnosis not present

## 2023-06-09 DIAGNOSIS — R2681 Unsteadiness on feet: Secondary | ICD-10-CM | POA: Diagnosis not present

## 2023-06-10 ENCOUNTER — Ambulatory Visit (INDEPENDENT_AMBULATORY_CARE_PROVIDER_SITE_OTHER): Payer: Medicare HMO | Admitting: Physician Assistant

## 2023-06-10 ENCOUNTER — Other Ambulatory Visit: Payer: Medicare HMO

## 2023-06-10 ENCOUNTER — Encounter: Payer: Self-pay | Admitting: Physician Assistant

## 2023-06-10 VITALS — BP 142/78 | HR 90 | Ht 68.0 in | Wt 197.0 lb

## 2023-06-10 DIAGNOSIS — R0602 Shortness of breath: Secondary | ICD-10-CM

## 2023-06-10 DIAGNOSIS — I5043 Acute on chronic combined systolic (congestive) and diastolic (congestive) heart failure: Secondary | ICD-10-CM | POA: Diagnosis not present

## 2023-06-10 DIAGNOSIS — Z9981 Dependence on supplemental oxygen: Secondary | ICD-10-CM | POA: Diagnosis not present

## 2023-06-10 DIAGNOSIS — N184 Chronic kidney disease, stage 4 (severe): Secondary | ICD-10-CM | POA: Diagnosis not present

## 2023-06-10 DIAGNOSIS — M6281 Muscle weakness (generalized): Secondary | ICD-10-CM | POA: Diagnosis not present

## 2023-06-10 DIAGNOSIS — D649 Anemia, unspecified: Secondary | ICD-10-CM

## 2023-06-10 DIAGNOSIS — I4891 Unspecified atrial fibrillation: Secondary | ICD-10-CM

## 2023-06-10 DIAGNOSIS — D509 Iron deficiency anemia, unspecified: Secondary | ICD-10-CM | POA: Diagnosis not present

## 2023-06-10 DIAGNOSIS — D472 Monoclonal gammopathy: Secondary | ICD-10-CM

## 2023-06-10 DIAGNOSIS — I483 Typical atrial flutter: Secondary | ICD-10-CM

## 2023-06-10 DIAGNOSIS — E1122 Type 2 diabetes mellitus with diabetic chronic kidney disease: Secondary | ICD-10-CM

## 2023-06-10 DIAGNOSIS — J9611 Chronic respiratory failure with hypoxia: Secondary | ICD-10-CM

## 2023-06-10 DIAGNOSIS — K219 Gastro-esophageal reflux disease without esophagitis: Secondary | ICD-10-CM | POA: Diagnosis not present

## 2023-06-10 DIAGNOSIS — R2681 Unsteadiness on feet: Secondary | ICD-10-CM | POA: Diagnosis not present

## 2023-06-10 DIAGNOSIS — K921 Melena: Secondary | ICD-10-CM

## 2023-06-10 NOTE — Progress Notes (Signed)
 06/10/2023 [name] [id number] 05/27/1930  Referring provider: Venita Sheffield, * Primary GI doctor: Dr. Marina Goodell  ASSESSMENT AND PLAN:   Assessment and Plan    Iron Deficiency Anemia Previously had black stools and low blood count, currently on oral iron supplementation. Stools are dark but not black, likely due to iron supplementation. No current symptoms of upper GI bleed. -Continue oral iron supplementation (325mg  daily). -Check CBC today to assess response to iron supplementation.  Chronic Kidney Disease Stage 4 kidney function noted on previous visit. No current urinary symptoms. -Refer to nephrology for further management and potential erythropoietin therapy. -Check kidney function today.  Gastroesophageal Reflux Disease On pantoprazole 40mg  daily, no current symptoms of heartburn, trouble swallowing, or abdominal pain. -Continue pantoprazole 40mg  daily.  Atrial Fibrillation On Eliquis 2.5mg  twice daily, no current symptoms of stroke or bleeding. -Continue Eliquis 2.5mg  twice daily.  Shortness of Breath Ongoing issue, particularly with exertion. On nocturnal oxygen therapy. -Continue current management, monitor for worsening symptoms.  Follow-up in 3-4 months, or sooner depending on lab results. Urgent ER visit advised for symptoms of severe GI bleed.       Melena x 3 days, no further episodes Last episode for 3 days, no further black tarry stools, he has had dark stools but has been on oral iron daily since Dec CT 04/2022 with hiatal hernia 03/25/2023  HGB 10.6 MCV 97.3 Platelets 353.0 03/25/2023 Iron 37 Ferritin 127.1 B12 524  The patient is hemodynamically stable Will recheck Cbc/iron/ferritin, if patient has no further anemia and responding well to iron, will continue PPI and iron and continue conservative treatment since patient is very high risk for endoscopic evaluation.  If his iron is still low, consider IV iron with repeat  hemoccults versus considering EGD in the hospital but patient is very high risk for procedures.  ER precautions discussed with the patient: severe weakness/dizziness, severe AB pain, vomit blood, shortness of breath or chest pain.   Stage 4 CKD Patient has degree of CKD that is likely contributing to anemia.  Worse than previous, on diuretics with cardiology Will recheck kidney function and still elevated discuss with cardiology about adjusting diuretics, appears euvolemic. Will check erythropoietin and refer to nephrology  Pulmonary hypertension/OSA Not on CPAP, on O2 at night  Aflutter  on eliquis 2.5 mg BID Continue for now, if patient continues to have evidence of GI bleeding, may need to have conversation with cardiology/EP about risk versus benefit of medication  CAD s/p stent 2022 On eliquis No chest pain  Patient Care Team: Venita Sheffield, MD as PCP - General (Internal Medicine) Bensimhon, Bevelyn Buckles, MD as PCP - Cardiology (Cardiology) Bensimhon, Bevelyn Buckles, MD as Consulting Physician (Cardiology) Penninger, Lillia Abed, [place] as Physician Assistant (Nephrology) Terrial Rhodes, MD as Consulting Physician (Nephrology) Karie Soda, MD as Consulting Physician (General Surgery) Loletha Carrow, MD as Consulting Physician (Ophthalmology)  HISTORY OF PRESENT ILLNESS: 89 y.o. male with a past medical history of DM 2, CKD, hypertension, CAD status post multiple PCI's, RAS s/p stenting, mild CKD, and MGUS and others listed below presents for evaluation of melena.   01/24/2016 OV with Kevin Schwartz for FOBT +, set up for colon 02/08/2016 Colon for FOBT+ 3 mm, diverticulosis, hemorrhoids 05/14/2022 CT AB and pelvis without contrast for AB pain small hiatal hernia, no acute pathology, cardiomegaly, 4 distal AB aortic aneurysm 3 CM 12/12/2022 HGB 12.7, MCV 93.3, BNP 137. 07/11/2021 Iron 26, sat 9  Patient admitted 07/2020 with NSTEMI.  Echo 45-50%,  cath with multivessel disease, s/p PCI  with DES to mid LAD, ballow angio left circumflex.  06/2021 ADHF EF 45-50% severely elevated pulmonary systolic pressure 67.4, he is on O2 at night for last 2 years. Moderate to severe MR 01.30/25 Echo Left ventricular ejection fraction, by estimation, is 45 to 50%. The  left ventricle has mildly decreased function. The left ventricle  demonstrates global hypokinesis. There is mild left ventricular hypertrophy. Left ventricular diastolic parameters are consistent with Grade I diastolic dysfunction (impaired relaxation). Elevated left ventricular end-diastolic pressure. The average left ventricular global longitudinal strain is -9.3 %. The global longitudinal strain is abnormal.   2. Right ventricular systolic function is normal. The right ventricular size is normal. There is normal pulmonary artery systolic pressure.   3. Left atrial size was severely dilated.   4. The mitral valve is degenerative. Moderate mitral valve regurgitation.  Mild mitral stenosis.   5. The aortic valve is normal in structure. There is moderate  calcification of the aortic valve. There is moderate thickening of the  aortic valve. Aortic valve regurgitation is mild to moderate. No aortic  stenosis is present.   Discussed the use of AI scribe software for clinical note transcription with the patient, who gave verbal consent to proceed.  History of Present Illness   Kevin Schwartz is a 89 year old male with iron deficiency anemia and chronic kidney disease who presents for follow-up of anemia and gastrointestinal symptoms.  He is currently on oral iron supplements, 325 mg once daily, and pantoprazole 40 mg once daily. His stools have been dark, attributed to iron supplementation, but not as black as before. No heartburn, trouble swallowing, nausea, abdominal pain, constipation, or changes in bowel habits. No stomach upset from the iron supplementation. A previous H. pylori test was negative, and he had experienced black  stools, which had improved by the last visit.  He has chronic kidney disease, previously assessed as stage four, with a GFR around 20. He is on Eliquis 2.5 mg twice daily and Jardiance, which causes frequent urination. He also takes torsemide for fluid management. He has not yet seen a kidney specialist. No dark urine or blood in the urine.  He experiences shortness of breath during physical therapy. He uses oxygen at night but not during the day and typically uses a walker for mobility. He resides at a rehabilitation facility and is involved in physical therapy.      He  reports that he has been smoking cigarettes. He has never used smokeless tobacco. He reports that he does not currently use alcohol. He reports that he does not use drugs.  RELEVANT LABS AND IMAGING:  Results   LABS H. pylori: Negative (03/2023) CBC: Anemia (03/2023) Kidney function: Stage 4, Cr 20 (03/2023)  DIAGNOSTIC Echocardiogram: Systolic murmur on the right side, irregular (05/14/2023)      CBC    Component Value Date/Time   WBC 7.1 03/25/2023 1417   RBC 3.29 (L) 03/25/2023 1417   HGB 10.6 (L) 03/25/2023 1417   HGB 12.8 07/30/2015 0919   HGB 11.9 (L) 10/06/2013 1016   HCT 32.0 (L) 03/25/2023 1417   HCT 38.8 07/30/2015 0919   HCT 36.1 (L) 10/06/2013 1016   PLT 353.0 03/25/2023 1417   PLT 382 (H) 07/30/2015 0919   MCV 97.3 03/25/2023 1417   MCV 93 07/30/2015 0919   MCV 95.2 10/06/2013 1016   MCH 32.0 05/16/2022 0557   MCHC 33.1 03/25/2023 1417   RDW 16.7 (  H) 03/25/2023 1417   RDW 15.2 07/30/2015 0919   RDW 14.4 10/06/2013 1016   LYMPHSABS 1.0 03/25/2023 1417   LYMPHSABS 1.8 07/30/2015 0919   LYMPHSABS 0.9 10/06/2013 1016   MONOABS 0.8 03/25/2023 1417   MONOABS 0.5 10/06/2013 1016   EOSABS 0.4 03/25/2023 1417   EOSABS 0.3 07/30/2015 0919   BASOSABS 0.1 03/25/2023 1417   BASOSABS 0.0 07/30/2015 0919   BASOSABS 0.1 10/06/2013 1016   Recent Labs    03/25/23 1417  HGB 10.6*    CMP      Component Value Date/Time   NA 139 05/14/2023 1229   NA 143 04/30/2015 1017   NA 140 10/06/2013 1016   K 4.0 05/14/2023 1229   K 4.5 10/06/2013 1016   CL 101 05/14/2023 1229   CL 108 (H) 07/23/2012 1031   CO2 22 05/14/2023 1229   CO2 23 10/06/2013 1016   GLUCOSE 93 05/14/2023 1229   GLUCOSE 97 10/06/2013 1016   GLUCOSE 116 (H) 07/23/2012 1031   BUN 67 (H) 05/14/2023 1229   BUN 21 04/30/2015 1017   BUN 17.2 10/06/2013 1016   CREATININE 3.13 (H) 05/14/2023 1229   CREATININE 2.04 (H) 04/11/2021 1127   CREATININE 1.4 (H) 10/06/2013 1016   CALCIUM 9.6 05/14/2023 1229   CALCIUM 8.5 10/06/2013 1016   PROT 8.7 (H) 03/25/2023 1417   PROT 7.3 02/04/2021 1030   PROT 7.7 10/06/2013 1016   ALBUMIN 4.5 03/25/2023 1417   ALBUMIN 4.4 02/04/2021 1030   ALBUMIN 3.9 10/06/2013 1016   AST 17 03/25/2023 1417   AST 18 11/16/2018 1245   AST 23 10/06/2013 1016   ALT 12 03/25/2023 1417   ALT 13 11/16/2018 1245   ALT 23 10/06/2013 1016   ALKPHOS 82 03/25/2023 1417   ALKPHOS 90 10/06/2013 1016   BILITOT 0.5 03/25/2023 1417   BILITOT 0.7 02/04/2021 1030   BILITOT 0.4 11/16/2018 1245   BILITOT 0.39 10/06/2013 1016   GFRNONAA 18 (L) 05/14/2023 1229   GFRNONAA 46 (L) 08/07/2020 0000   GFRAA 53 (L) 08/07/2020 0000      Latest Ref Rng & Units 03/25/2023    2:17 PM 05/15/2022    3:09 AM 05/14/2022    6:48 PM  Hepatic Function  Total Protein 6.0 - 8.3 g/dL 8.7  6.4  6.9   Albumin 3.5 - 5.2 g/dL 4.5  3.2  3.6   AST 0 - 37 U/L 17  26  34   ALT 0 - 53 U/L 12  16  17    Alk Phosphatase 39 - 117 U/L 82  50  57   Total Bilirubin 0.2 - 1.2 mg/dL 0.5  0.5  0.5       Current Medications:   Current Outpatient Medications (Endocrine & Metabolic):    empagliflozin (JARDIANCE) 10 MG TABS tablet, Patient takes 1 tablet by mouth every other day.   levothyroxine (SYNTHROID) 25 MCG tablet, Take 25 mcg by mouth daily before breakfast.  Current Outpatient Medications (Cardiovascular):    atorvastatin  (LIPITOR) 80 MG tablet, Take 1 tablet (80 mg total) by mouth daily.   ezetimibe (ZETIA) 10 MG tablet, Take 10 mg by mouth daily.   torsemide (DEMADEX) 20 MG tablet, Take torsemide 100mg  every morning, and 20 mg every afternoon at 3pm  Current Outpatient Medications (Respiratory):    benzonatate (TESSALON) 100 MG capsule, Take 100 mg by mouth 3 (three) times daily.   cetirizine (ZYRTEC) 10 MG tablet, Take 10 mg by mouth daily.  sodium chloride (OCEAN) 0.65 % SOLN nasal spray, Place 1 spray into both nostrils daily.  Current Outpatient Medications (Analgesics):    acetaminophen (TYLENOL) 500 MG tablet, Take 500 mg by mouth every 6 (six) hours as needed.   allopurinol (ZYLOPRIM) 100 MG tablet, Take 1 tablet (100 mg total) by mouth daily.  Current Outpatient Medications (Hematological):    apixaban (ELIQUIS) 2.5 MG TABS tablet, Take 2.5 mg by mouth 2 (two) times daily.   ferrous sulfate 324 MG TBEC, Take 324 mg by mouth.  Current Outpatient Medications (Other):    diclofenac Sodium (VOLTAREN) 1 % GEL, Apply 2 g topically 3 (three) times daily.   Menthol, Topical Analgesic, (BIOFREEZE) 4 % GEL, Apply 1 application  topically in the morning and at bedtime. To right wrist/hand   Menthol, Topical Analgesic, (EUCERIN ITCH RELIEF) 0.1 % LOTN, Apply 1 application  topically daily. To rash on chest/abdomin/back/neck/shoulder   Multiple Vitamin (MULTIVITAMIN) tablet, Take 1 tablet by mouth daily.   pantoprazole (PROTONIX) 40 MG tablet, Take 1 tablet (40 mg total) by mouth daily.   Petrolatum Hydrophilic OINT, Apply 1 application  topically at bedtime. To bilateral nares   potassium chloride (KLOR-CON) 10 MEQ tablet, Take 10 mEq by mouth 2 (two) times daily.   potassium chloride SA (KLOR-CON M) 20 MEQ tablet, Take 40 mEq by mouth daily.   Skin Protectants, Misc. (EUCERIN) cream, Apply 1 Application topically daily.   tamsulosin (FLOMAX) 0.4 MG CAPS capsule, Take 0.4 mg by mouth daily.   White  Petrolatum (VASELINE EX), Apply 1 application  topically at bedtime. To bilateral nares   Zinc Oxide 25 % PSTE, Apply 1 application  topically 2 (two) times daily. To bottom during incontinence care  Medical History:  Past Medical History:  Diagnosis Date   Anemia due to chronic kidney disease    Bilateral inguinal hernia    CAD (coronary artery disease) cardiologist-  dr bensimhon   PTCA of OM in 1998, BMS OM in 2000, Cath 9/07 LM ok LAD ok. LCX 95% in OM prior to previous stent RCA. nondominant normal EF normal. Cypher DES to OM 2007 (PLACED PROXIAMAL TO PREVIOUS STENT)   Chronic kidney disease, stage III (moderate) (HCC)    nephrologist-  dr detarding   Depression    Diabetes mellitus type 2, diet-controlled (HCC)    followed by pcp,  last A1c 5.2 on 09-10-2017 in epic   Gout, unspecified    10-29-2017  per pt stable   HLD (hyperlipidemia)    HTN (hypertension)    MGUS (monoclonal gammopathy of unknown significance) previously followed by dr Cyndie Chime , Theron Arista and released 08/01/2011   IgG kappa dx 2002 8% plasma cells in bone marrow; no lesions on bone X-rays;   Nocturia    OA (osteoarthritis)    OSA on CPAP    per study 01-30-2004  severe osa , AHI 51.6/hr   PAD (peripheral artery disease) (HCC)    05-21-2006  left renal artery stenosis, s/p balloon angioplasty and stenting;  last duplex 02/ 2012  normal , arteries patent   S/P coronary artery stent placement    2000--  BMS x1  to OM;   2007-- DES x1  to OM proximal to previous stent   Secondary hyperparathyroidism of renal origin [facility])    Urgency of urination    Wears glasses    Allergies:  Allergies  Allergen Reactions   Cozaar [Losartan Potassium] Swelling and Other (See Comments)    Angioedema  Zestril [Lisinopril] Swelling and Other (See Comments)    Angioedema    Crestor [Rosuvastatin] Swelling and Other (See Comments)    Angioedema    Tape Other (See Comments)    Sensitive skin, tears easily.     Surgical  History:  He  has a past surgical history that includes Total knee arthroplasty (Left, 07-22-2005   dr Simonne Come  [facility]); Coronary angioplasty (1998); Coronary angioplasty with stent (2000); Coronary angioplasty with stent (12-18-2005  dr Tresa Endo); Renal angioplasty (Left, 05-21-2006   dr berry); transthoracic echocardiogram (04/18/2016); Cardiovascular stress test (2010); Inguinal hernia repair (Bilateral, 10/30/2017); Insertion of mesh (Bilateral, 10/30/2017); Hernia repair; LEFT HEART CATH AND CORONARY ANGIOGRAPHY (N/A, 08/01/2020); CORONARY STENT INTERVENTION (N/A, 08/02/2020); and CORONARY BALLOON ANGIOPLASTY (N/A, 08/02/2020). Family History:  His family history includes Cancer in his sister and another family member; Coronary artery disease in an other family member; Diabetes in his brother, brother and another family member; Healthy in his father; Heart disease in his mother, sister, and sister; Hypertension in his sister.  REVIEW OF SYSTEMS  : All other systems reviewed and negative except where noted in the History of Present Illness.  PHYSICAL EXAM: BP (!) 142/78   Pulse 90   Ht 5\' 8"  (1.727 m)   Wt 197 lb (89.4 kg)   BMI 29.95 kg/m  General Appearance: chronically ill appearing AA male in no apparent distress. Head:   Normocephalic and atraumatic. Eyes:  sclerae anicteric,conjunctive pink  Respiratory: Respiratory effort normal, decreased breath sounds, no wheezing, rhonchi, rales Cardio: RRR with systolic murmur Peripheral pulses intact.  Abdomen: Soft,  Obese ,active bowel sounds. No tenderness . No masses. Rectal: defer due to oral iron Musculoskeletal: Full ROM, not tested gait, in wheel chair, able to stand for rectal exam with assistance, uses walker at home, With edema. Skin:  Dry and intact without significant lesions or rashes Neuro: Alert and  oriented x4;  No focal deficits. Psych:  Cooperative. Normal mood and affect.    Doree Albee, PA-C 1:51 PM

## 2023-06-10 NOTE — Progress Notes (Signed)
 Reviewed. Multifactorial anemia.  Stable. Agree with supportive measures as outlined for this 89 year old with multiple comorbidities

## 2023-06-10 NOTE — Patient Instructions (Addendum)
 Your provider has requested that you go to the basement level for lab work before leaving today. Press "B" on the elevator. The lab is located at the first door on the left as you exit the elevator.  Advised to go to the ER if there is any severe weakness, severe abdominal pain, vomit blood, dark red blood in your bowel movement, shortness of breath or chest pain.   Stay on the pantoprazole 40 mg once a day Continue iron for now Follow up with kidney doctor and heart doctor  We have you set up for a follow up on 08/14/2023 at   _______________________________________________________  If your blood pressure at your visit was 140/90 or greater, please contact your primary care physician to follow up on this.  _______________________________________________________  If you are age 74 or older, your body mass index should be between 23-30. Your Body mass index is 29.95 kg/m. If this is out of the aforementioned range listed, please consider follow up with your Primary Care Provider.  If you are age 23 or younger, your body mass index should be between 19-25. Your Body mass index is 29.95 kg/m. If this is out of the aformentioned range listed, please consider follow up with your Primary Care Provider.   ________________________________________________________  The [facility] providers would like to encourage you to use [facility] to communicate with providers for non-urgent requests or questions.  Due to long hold times on the telephone, sending your provider a message by [facility] may be a faster and more efficient way to get a response.  Please allow 48 business hours for a response.  Please remember that this is for non-urgent requests.  _______________________________________________________ It was a pleasure to see you today!  Thank you for trusting me with your gastrointestinal care!

## 2023-06-11 DIAGNOSIS — R2681 Unsteadiness on feet: Secondary | ICD-10-CM | POA: Diagnosis not present

## 2023-06-11 DIAGNOSIS — I5043 Acute on chronic combined systolic (congestive) and diastolic (congestive) heart failure: Secondary | ICD-10-CM | POA: Diagnosis not present

## 2023-06-11 DIAGNOSIS — M6281 Muscle weakness (generalized): Secondary | ICD-10-CM | POA: Diagnosis not present

## 2023-06-25 ENCOUNTER — Telehealth: Payer: Self-pay | Admitting: Physician Assistant

## 2023-06-25 NOTE — Telephone Encounter (Signed)
 Jessica from [facility] called and stated that patient was referred to the kidney specialist and she had called to see if they could get the patient in. When she called the kidney specialist rep stated that patient referral was not urgent and if he is wanting to be seen sooner we would have to send a referral at level 1 as urgent. Shanda Bumps stated that when speak to the patient he was under the impression that the referral was urgent. Shanda Bumps would like a new referral placed or a call back to advise patient other wise. Please advise. A Good call back number is [phone]

## 2023-06-25 NOTE — Telephone Encounter (Signed)
 Spoke with Shanda Bumps with [facility] & advised her that urology referral was not placed as urgent, patient has long history of chronic kidney disease. She will make patient aware.

## 2023-08-13 ENCOUNTER — Other Ambulatory Visit

## 2023-08-13 ENCOUNTER — Encounter: Payer: Self-pay | Admitting: Physician Assistant

## 2023-08-13 ENCOUNTER — Ambulatory Visit: Payer: Medicare HMO | Admitting: Physician Assistant

## 2023-08-13 VITALS — BP 100/70 | HR 64 | Ht 68.0 in | Wt 202.1 lb

## 2023-08-13 DIAGNOSIS — I272 Pulmonary hypertension, unspecified: Secondary | ICD-10-CM | POA: Diagnosis not present

## 2023-08-13 DIAGNOSIS — I509 Heart failure, unspecified: Secondary | ICD-10-CM | POA: Diagnosis not present

## 2023-08-13 DIAGNOSIS — D649 Anemia, unspecified: Secondary | ICD-10-CM | POA: Diagnosis not present

## 2023-08-13 DIAGNOSIS — Z9981 Dependence on supplemental oxygen: Secondary | ICD-10-CM

## 2023-08-13 DIAGNOSIS — J9611 Chronic respiratory failure with hypoxia: Secondary | ICD-10-CM

## 2023-08-13 DIAGNOSIS — G4733 Obstructive sleep apnea (adult) (pediatric): Secondary | ICD-10-CM

## 2023-08-13 DIAGNOSIS — I483 Typical atrial flutter: Secondary | ICD-10-CM

## 2023-08-13 DIAGNOSIS — N184 Chronic kidney disease, stage 4 (severe): Secondary | ICD-10-CM

## 2023-08-13 DIAGNOSIS — I2510 Atherosclerotic heart disease of native coronary artery without angina pectoris: Secondary | ICD-10-CM

## 2023-08-13 DIAGNOSIS — E1122 Type 2 diabetes mellitus with diabetic chronic kidney disease: Secondary | ICD-10-CM

## 2023-08-13 DIAGNOSIS — Z7901 Long term (current) use of anticoagulants: Secondary | ICD-10-CM

## 2023-08-13 DIAGNOSIS — I5021 Acute systolic (congestive) heart failure: Secondary | ICD-10-CM

## 2023-08-13 DIAGNOSIS — D472 Monoclonal gammopathy: Secondary | ICD-10-CM

## 2023-08-13 DIAGNOSIS — Z87891 Personal history of nicotine dependence: Secondary | ICD-10-CM

## 2023-08-13 NOTE — Patient Instructions (Addendum)
 Your provider has requested that you go to the basement level for lab work before leaving today. Press "B" on the elevator. The lab is located at the first door on the left as you exit the elevator.  Due to recent changes in healthcare laws, you may see the results of your imaging and laboratory studies on MyChart before your provider has had a chance to review them.  We understand that in some cases there may be results that are confusing or concerning to you. Not all laboratory results come back in the same time frame and the provider may be waiting for multiple results in order to interpret others.  Please give us  48 hours in order for your provider to thoroughly review all the results before contacting the office for clarification of your results.    Advised to go to the ER if there is any severe weakness, severe abdominal pain, vomit blood, dark red blood in your bowel movement, shortness of breath or chest pain.    Thank you for trusting me with your gastrointestinal care!   Santina Cull, PA-C  _______________________________________________________  If your blood pressure at your visit was 140/90 or greater, please contact your primary care physician to follow up on this.  _______________________________________________________  If you are age 2 or older, your body mass index should be between 23-30. Your Body mass index is 30.73 kg/m. If this is out of the aforementioned range listed, please consider follow up with your Primary Care Provider.  If you are age 88 or younger, your body mass index should be between 19-25. Your Body mass index is 30.73 kg/m. If this is out of the aformentioned range listed, please consider follow up with your Primary Care Provider.   ________________________________________________________  The [facility] providers would like to encourage you to use MYCHART to communicate with providers for non-urgent requests or questions.  Due to long hold times on  the telephone, sending your provider a message by [facility] may be a faster and more efficient way to get a response.  Please allow 48 business hours for a response.  Please remember that this is for non-urgent requests.  _______________________________________________________

## 2023-08-13 NOTE — Progress Notes (Signed)
 08/13/2023 Kevin Schwartz [id number] [birth year]  Referring provider: Tye Gall, * Primary GI doctor: Dr. Elvin Hammer  ASSESSMENT AND PLAN:   Melena x 3 days, no further episodes No melena, still on oral iron 65 mg once daily CT 04/2022 with hiatal hernia 03/25/2023  HGB 10.6 MCV 97.3 Platelets 353.0 03/25/2023 Iron 37 Ferritin 127.1 B12 524 Will recheck Cbc/iron/ferritin, if patient has no further anemia and responding well to iron, will continue PPI and iron and continue conservative treatment since patient is very high risk for endoscopic evaluation unless he has overt GI bleeding Will refer to nephrology, likely kidney function/chronic disease is contributing to any anemia ER precautions discussed with the patient: severe weakness/dizziness, severe AB pain, vomit blood, shortness of breath or chest pain.   Stage 4 CKD Patient has degree of CKD that is likely contributing to anemia.  Worse than previous, on diuretics with cardiology Will recheck kidney function and still elevated discuss with cardiology about adjusting diuretics, appears euvolemic. Has not heart from renal will put in urgent referral today and get labs  CHF  Follows with Dr. Gaynel Keel, last seen 05/14/2023  03/23 admitted with CHF EF 45-50%, RV mildly reduced, PASP 67.4 mmHg, moderate to severe MR, severe TR, mild to moderate AI  Some mild decreased breath sounds bilateral Will get BNP, will send labs to cardiology, ER precautions discussed, low sodium diet, on toresomide 100 mg once daily and jardiance  every other day.   Pulmonary hypertension/OSA Not on CPAP, on O2 at night  Aflutter  on eliquis  2.5 mg BID Continue for now, if patient continues to have evidence of GI bleeding, may need to have conversation with cardiology/EP about risk versus benefit of medication  CAD s/p stent 2022 On eliquis  No chest pain  RAS  s/p stenting L renal artery 2008  Patient Care Team: Tye Gall, MD as PCP - General (Internal Medicine) Bensimhon, Rheta Celestine, MD as PCP - Cardiology (Cardiology) Bensimhon, Rheta Celestine, MD as Consulting Physician (Cardiology) Penninger, Heidi Llamas, [place] as Physician Assistant (Nephrology) Charley Constable, MD as Consulting Physician (Nephrology) Candyce Champagne, MD as Consulting Physician (General Surgery) Ricki Charon, MD as Consulting Physician (Ophthalmology)  HISTORY OF PRESENT ILLNESS: 89 y.o. male with a past medical history of DM 2, CKD, hypertension, CAD status post multiple PCI's, RAS s/p stenting, mild CKD, and MGUS and others listed below presents for evaluation of melena.   01/24/2016 OV with Maureen Sour for FOBT +, set up for colon 02/08/2016 Colon for FOBT+ 3 mm, diverticulosis, hemorrhoids 05/14/2022 CT AB and pelvis without contrast for AB pain small hiatal hernia, no acute pathology, cardiomegaly, 4 distal AB aortic aneurysm 3 CM 12/12/2022 HGB 12.7, MCV 93.3, BNP 137. 07/11/2021 Iron 26, sat 9  Patient admitted 07/2020 with NSTEMI.  Echo 45-50%, cath with multivessel disease, s/p PCI with DES to mid LAD, ballow angio left circumflex.  06/2021 ADHF EF 45-50% severely elevated pulmonary systolic pressure 67.4, he is on O2 at night for last 2 years. Moderate to severe MR 01.30/25 Echo Left ventricular ejection fraction, by estimation, is 45 to 50%. The  left ventricle has mildly decreased function. The left ventricle  demonstrates global hypokinesis. There is mild left ventricular hypertrophy. Left ventricular diastolic parameters are consistent with Grade I diastolic dysfunction (impaired relaxation). Elevated left ventricular end-diastolic pressure. The average left ventricular global longitudinal strain is -9.3 %. The global longitudinal strain is abnormal.   2. Right ventricular systolic function is normal. The right ventricular size is  normal. There is normal pulmonary artery systolic pressure.   3. Left atrial size was severely  dilated.   4. The mitral valve is degenerative. Moderate mitral valve regurgitation.  Mild mitral stenosis.   5. The aortic valve is normal in structure. There is moderate  calcification of the aortic valve. There is moderate thickening of the  aortic valve. Aortic valve regurgitation is mild to moderate. No aortic  stenosis is present.   Discussed the use of AI scribe software for clinical note transcription with the patient, who gave verbal consent to proceed.  History of Present Illness   Kevin Schwartz is a 89 year old male with heart failure who presents with concerns about kidney health and medication management.  He is concerned about his kidney health and has not yet seen a nephrologist despite a previous referral. He is eager to consult a kidney specialist and is uncertain about the status of his referral.  He has a history of heart failure and is managed by a cardiologist. His current medications include Jardiance  10 mg every other day and torsemide  100 mg once daily, with no recent changes to his diuretics. He adheres to a low sodium diet.  He denies experiencing dark black stools or blood in the stool and is no longer on oral iron supplements. He is currently taking iron 65 mg.  He experiences wheezing and a sensation of fluid overload. He denies abdominal pain, nausea, heartburn, leg swelling, coughing, mucus production, fevers, or chills. He uses oxygen at night but not during the day.  He mentions an incident of leg weakness where his leg gave way while at the barber shop.      He  reports that he has quit smoking. His smoking use included cigarettes. He has never used smokeless tobacco. He reports that he does not currently use alcohol. He reports that he does not use drugs.  RELEVANT LABS AND IMAGING:  Results          CBC    Component Value Date/Time   WBC 7.1 03/25/2023 1417   RBC 3.29 (L) 03/25/2023 1417   HGB 10.6 (L) 03/25/2023 1417   HGB 12.8  07/30/2015 0919   HGB 11.9 (L) 10/06/2013 1016   HCT 32.0 (L) 03/25/2023 1417   HCT 38.8 07/30/2015 0919   HCT 36.1 (L) 10/06/2013 1016   PLT 353.0 03/25/2023 1417   PLT 382 (H) 07/30/2015 0919   MCV 97.3 03/25/2023 1417   MCV 93 07/30/2015 0919   MCV 95.2 10/06/2013 1016   MCH 32.0 05/16/2022 0557   MCHC 33.1 03/25/2023 1417   RDW 16.7 (H) 03/25/2023 1417   RDW 15.2 07/30/2015 0919   RDW 14.4 10/06/2013 1016   LYMPHSABS 1.0 03/25/2023 1417   LYMPHSABS 1.8 07/30/2015 0919   LYMPHSABS 0.9 10/06/2013 1016   MONOABS 0.8 03/25/2023 1417   MONOABS 0.5 10/06/2013 1016   EOSABS 0.4 03/25/2023 1417   EOSABS 0.3 07/30/2015 0919   BASOSABS 0.1 03/25/2023 1417   BASOSABS 0.0 07/30/2015 0919   BASOSABS 0.1 10/06/2013 1016   Recent Labs    03/25/23 1417  HGB 10.6*    CMP     Component Value Date/Time   NA 139 05/14/2023 1229   NA 143 04/30/2015 1017   NA 140 10/06/2013 1016   K 4.0 05/14/2023 1229   K 4.5 10/06/2013 1016   CL 101 05/14/2023 1229   CL 108 (H) 07/23/2012 1031   CO2 22 05/14/2023 1229   CO2  23 10/06/2013 1016   GLUCOSE 93 05/14/2023 1229   GLUCOSE 97 10/06/2013 1016   GLUCOSE 116 (H) 07/23/2012 1031   BUN 67 (H) 05/14/2023 1229   BUN 21 04/30/2015 1017   BUN 17.2 10/06/2013 1016   CREATININE 3.13 (H) 05/14/2023 1229   CREATININE 2.04 (H) 04/11/2021 1127   CREATININE 1.4 (H) 10/06/2013 1016   CALCIUM  9.6 05/14/2023 1229   CALCIUM  8.5 10/06/2013 1016   PROT 8.7 (H) 03/25/2023 1417   PROT 7.3 02/04/2021 1030   PROT 7.7 10/06/2013 1016   ALBUMIN  4.5 03/25/2023 1417   ALBUMIN  4.4 02/04/2021 1030   ALBUMIN  3.9 10/06/2013 1016   AST 17 03/25/2023 1417   AST 18 11/16/2018 1245   AST 23 10/06/2013 1016   ALT 12 03/25/2023 1417   ALT 13 11/16/2018 1245   ALT 23 10/06/2013 1016   ALKPHOS 82 03/25/2023 1417   ALKPHOS 90 10/06/2013 1016   BILITOT 0.5 03/25/2023 1417   BILITOT 0.7 02/04/2021 1030   BILITOT 0.4 11/16/2018 1245   BILITOT 0.39 10/06/2013 1016    GFRNONAA 18 (L) 05/14/2023 1229   GFRNONAA 46 (L) 08/07/2020 0000   GFRAA 53 (L) 08/07/2020 0000      Latest Ref Rng & Units 03/25/2023    2:17 PM 05/15/2022    3:09 AM 05/14/2022    6:48 PM  Hepatic Function  Total Protein 6.0 - 8.3 g/dL 8.7  6.4  6.9   Albumin  3.5 - 5.2 g/dL 4.5  3.2  3.6   AST 0 - 37 U/L 17  26  34   ALT 0 - 53 U/L 12  16  17    Alk Phosphatase 39 - 117 U/L 82  50  57   Total Bilirubin 0.2 - 1.2 mg/dL 0.5  0.5  0.5       Current Medications:   Current Outpatient Medications (Endocrine & Metabolic):    empagliflozin  (JARDIANCE ) 10 MG TABS tablet, Patient takes 1 tablet by mouth every other day.   levothyroxine  (SYNTHROID ) 25 MCG tablet, Take 25 mcg by mouth daily before breakfast.  Current Outpatient Medications (Cardiovascular):    atorvastatin  (LIPITOR ) 80 MG tablet, Take 1 tablet (80 mg total) by mouth daily.   ezetimibe  (ZETIA ) 10 MG tablet, Take 10 mg by mouth daily.  Current Outpatient Medications (Respiratory):    cetirizine (ZYRTEC) 10 MG tablet, Take 10 mg by mouth daily.   sodium chloride  (OCEAN) 0.65 % SOLN nasal spray, Place 1 spray into both nostrils daily.  Current Outpatient Medications (Analgesics):    acetaminophen  (TYLENOL ) 500 MG tablet, Take 500 mg by mouth every 6 (six) hours as needed.   allopurinol  (ZYLOPRIM ) 100 MG tablet, Take 1 tablet (100 mg total) by mouth daily.  Current Outpatient Medications (Hematological):    apixaban  (ELIQUIS ) 2.5 MG TABS tablet, Take 2.5 mg by mouth 2 (two) times daily.   ferrous sulfate 325 (65 FE) MG EC tablet, Take 325 mg by mouth daily.  Current Outpatient Medications (Other):    diclofenac  Sodium (VOLTAREN ) 1 % GEL, Apply 2 g topically 3 (three) times daily.   Menthol, Topical Analgesic, (BIOFREEZE) 4 % GEL, Apply 1 application  topically in the morning and at bedtime. To right wrist/hand   Menthol, Topical Analgesic, (EUCERIN ITCH RELIEF) 0.1 % LOTN, Apply 1 application  topically daily. To rash on  chest/abdomin/back/neck/shoulder   Multiple Vitamin (MULTIVITAMIN) tablet, Take 1 tablet by mouth daily.   pantoprazole  (PROTONIX ) 40 MG tablet, Take 1 tablet (40 mg  total) by mouth daily.   Petrolatum  Hydrophilic OINT, Apply 1 application  topically at bedtime. To bilateral nares   Potassium Chloride  ER 20 MEQ TBCR, Take 40 mEq by mouth daily.   Skin Protectants, Misc. (EUCERIN) cream, Apply 1 Application topically daily.   tamsulosin  (FLOMAX ) 0.4 MG CAPS capsule, Take 0.4 mg by mouth daily.   White Petrolatum  (VASELINE EX), Apply 1 application  topically at bedtime. To bilateral nares   Zinc Oxide 25 % PSTE, Apply 1 application  topically 2 (two) times daily. To bottom during incontinence care  Medical History:  Past Medical History:  Diagnosis Date   Anemia due to chronic kidney disease    Bilateral inguinal hernia    CAD (coronary artery disease) cardiologist-  dr bensimhon   PTCA of OM in 1998, BMS OM in 2000, Cath 9/07 LM ok LAD ok. LCX 95% in OM prior to previous stent RCA. nondominant normal EF normal. Cypher DES to OM 2007 (PLACED PROXIAMAL TO PREVIOUS STENT)   Chronic kidney disease, stage III (moderate) (HCC)    nephrologist-  dr detarding   Depression    Diabetes mellitus type 2, diet-controlled (HCC)    followed by pcp,  last A1c 5.2 on 09-10-2017 in epic   Gout, unspecified    10-29-2017  per pt stable   HLD (hyperlipidemia)    HTN (hypertension)    MGUS (monoclonal gammopathy of unknown significance) previously followed by dr Isidor Marek , Holli Lunger and released 08/01/2011   IgG kappa dx 2002 8% plasma cells in bone marrow; no lesions on bone X-rays;   Nocturia    OA (osteoarthritis)    OSA on CPAP    per study 01-30-2004  severe osa , AHI 51.6/hr   PAD (peripheral artery disease) (HCC)    05-21-2006  left renal artery stenosis, s/p balloon angioplasty and stenting;  last duplex 02/ 2012  normal , arteries patent   S/P coronary artery stent placement    2000--  BMS x1  to  OM;   2007-- DES x1  to OM proximal to previous stent   Secondary hyperparathyroidism of renal origin [facility])    Urgency of urination    Wears glasses    Allergies:  Allergies  Allergen Reactions   Cozaar  [Losartan  Potassium] Swelling and Other (See Comments)    Angioedema    Zestril  [Lisinopril ] Swelling and Other (See Comments)    Angioedema    Crestor  [Rosuvastatin ] Swelling and Other (See Comments)    Angioedema    Tape Other (See Comments)    Sensitive skin, tears easily.     Surgical History:  He  has a past surgical history that includes Total knee arthroplasty (Left, 07-22-2005   dr Mevelyn Acton  [facility]); Coronary angioplasty (1998); Coronary angioplasty with stent (2000); Coronary angioplasty with stent (12-18-2005  dr Loetta Ringer); Renal angioplasty (Left, 05-21-2006   dr berry); transthoracic echocardiogram (04/18/2016); Cardiovascular stress test (2010); Inguinal hernia repair (Bilateral, 10/30/2017); Insertion of mesh (Bilateral, 10/30/2017); Hernia repair; LEFT HEART CATH AND CORONARY ANGIOGRAPHY (N/A, 08/01/2020); CORONARY STENT INTERVENTION (N/A, 08/02/2020); and CORONARY BALLOON ANGIOPLASTY (N/A, 08/02/2020). Family History:  His family history includes Cancer in his sister and another family member; Coronary artery disease in an other family member; Diabetes in his brother, brother and another family member; Healthy in his father; Heart disease in his mother, sister, and sister; Hypertension in his sister.  REVIEW OF SYSTEMS  : All other systems reviewed and negative except where noted in the History of Present Illness.  PHYSICAL EXAM: BP 100/70   Pulse 64   Ht 5\' 8"  (1.727 m)   Wt 202 lb 2 oz (91.7 kg)   SpO2 94%   BMI 30.73 kg/m  General Appearance: chronically ill appearing AA male in no apparent distress. Head:   Normocephalic and atraumatic. Eyes:  sclerae anicteric,conjunctive pink  Respiratory: bilateral decreased lower lobe breath sounds with wheezing, no  rhonchi Cardio: RRR with systolic murmur Peripheral pulses intact.  Abdomen: Soft,  Obese ,active bowel sounds. No tenderness . No masses. Musculoskeletal: Full ROM, not tested gait, in wheel chai, uses walker at home, With edema. Skin:  Dry and intact without significant lesions or rashes Neuro: Alert and  oriented x4;  No focal deficits. Psych:  Cooperative. Normal mood and affect.    Edmonia Gottron, PA-C 3:41 PM

## 2023-08-13 NOTE — Progress Notes (Signed)
 Noted.

## 2023-08-14 LAB — PRO B NATRIURETIC PEPTIDE: NT-Pro BNP: 11290 pg/mL — ABNORMAL HIGH (ref 0–486)

## 2023-10-01 ENCOUNTER — Encounter: Payer: Medicare HMO | Admitting: Adult Health

## 2024-01-13 LAB — CBC: RBC: 3.9 (ref 3.87–5.11)

## 2024-01-13 LAB — BASIC METABOLIC PANEL WITH GFR
BUN: 52 — AB (ref 4–21)
CO2: 26 — AB (ref 13–22)
Chloride: 103 (ref 99–108)
Creatinine: 3.1 — AB (ref 0.6–1.3)
Glucose: 96
Potassium: 5.1 meq/L (ref 3.5–5.1)
Sodium: 141 (ref 137–147)

## 2024-01-13 LAB — CBC AND DIFFERENTIAL
HCT: 37 — AB (ref 41–53)
Hemoglobin: 12.1 — AB (ref 13.5–17.5)
Neutrophils Absolute: 4.7
Platelets: 251 K/uL (ref 150–400)
WBC: 6.9

## 2024-01-13 LAB — COMPREHENSIVE METABOLIC PANEL WITH GFR
Albumin: 4.8 (ref 3.5–5.0)
Calcium: 9.9 (ref 8.7–10.7)
eGFR: 18

## 2024-01-15 ENCOUNTER — Encounter: Payer: Self-pay | Admitting: Sports Medicine

## 2024-03-14 ENCOUNTER — Other Ambulatory Visit: Payer: Self-pay

## 2024-03-14 ENCOUNTER — Observation Stay (HOSPITAL_COMMUNITY)

## 2024-03-14 ENCOUNTER — Encounter (HOSPITAL_COMMUNITY): Payer: Self-pay

## 2024-03-14 ENCOUNTER — Emergency Department (HOSPITAL_COMMUNITY)

## 2024-03-14 ENCOUNTER — Observation Stay (HOSPITAL_COMMUNITY)
Admission: EM | Admit: 2024-03-14 | Discharge: 2024-03-15 | Disposition: A | Discharge status: Skilled Nursing Facility | Attending: Emergency Medicine | Admitting: Emergency Medicine

## 2024-03-14 DIAGNOSIS — Z7901 Long term (current) use of anticoagulants: Secondary | ICD-10-CM | POA: Insufficient documentation

## 2024-03-14 DIAGNOSIS — R7989 Other specified abnormal findings of blood chemistry: Secondary | ICD-10-CM

## 2024-03-14 DIAGNOSIS — Z87891 Personal history of nicotine dependence: Secondary | ICD-10-CM | POA: Insufficient documentation

## 2024-03-14 DIAGNOSIS — Z955 Presence of coronary angioplasty implant and graft: Secondary | ICD-10-CM | POA: Insufficient documentation

## 2024-03-14 DIAGNOSIS — E8809 Other disorders of plasma-protein metabolism, not elsewhere classified: Secondary | ICD-10-CM | POA: Diagnosis not present

## 2024-03-14 DIAGNOSIS — M25512 Pain in left shoulder: Secondary | ICD-10-CM | POA: Diagnosis not present

## 2024-03-14 DIAGNOSIS — I21A1 Myocardial infarction type 2: Secondary | ICD-10-CM | POA: Insufficient documentation

## 2024-03-14 DIAGNOSIS — I4891 Unspecified atrial fibrillation: Secondary | ICD-10-CM | POA: Diagnosis not present

## 2024-03-14 DIAGNOSIS — D631 Anemia in chronic kidney disease: Secondary | ICD-10-CM | POA: Insufficient documentation

## 2024-03-14 DIAGNOSIS — R0600 Dyspnea, unspecified: Principal | ICD-10-CM

## 2024-03-14 DIAGNOSIS — I130 Hypertensive heart and chronic kidney disease with heart failure and stage 1 through stage 4 chronic kidney disease, or unspecified chronic kidney disease: Secondary | ICD-10-CM | POA: Diagnosis not present

## 2024-03-14 DIAGNOSIS — I482 Chronic atrial fibrillation, unspecified: Secondary | ICD-10-CM | POA: Diagnosis not present

## 2024-03-14 DIAGNOSIS — Z6829 Body mass index (BMI) 29.0-29.9, adult: Secondary | ICD-10-CM | POA: Diagnosis not present

## 2024-03-14 DIAGNOSIS — I214 Non-ST elevation (NSTEMI) myocardial infarction: Secondary | ICD-10-CM | POA: Diagnosis not present

## 2024-03-14 DIAGNOSIS — N179 Acute kidney failure, unspecified: Secondary | ICD-10-CM | POA: Insufficient documentation

## 2024-03-14 DIAGNOSIS — E1122 Type 2 diabetes mellitus with diabetic chronic kidney disease: Secondary | ICD-10-CM | POA: Diagnosis not present

## 2024-03-14 DIAGNOSIS — N184 Chronic kidney disease, stage 4 (severe): Secondary | ICD-10-CM | POA: Insufficient documentation

## 2024-03-14 DIAGNOSIS — I5042 Chronic combined systolic (congestive) and diastolic (congestive) heart failure: Secondary | ICD-10-CM | POA: Insufficient documentation

## 2024-03-14 DIAGNOSIS — Z96652 Presence of left artificial knee joint: Secondary | ICD-10-CM | POA: Insufficient documentation

## 2024-03-14 DIAGNOSIS — E039 Hypothyroidism, unspecified: Secondary | ICD-10-CM | POA: Insufficient documentation

## 2024-03-14 DIAGNOSIS — J9610 Chronic respiratory failure, unspecified whether with hypoxia or hypercapnia: Secondary | ICD-10-CM | POA: Diagnosis not present

## 2024-03-14 DIAGNOSIS — R079 Chest pain, unspecified: Secondary | ICD-10-CM

## 2024-03-14 DIAGNOSIS — I5020 Unspecified systolic (congestive) heart failure: Secondary | ICD-10-CM

## 2024-03-14 DIAGNOSIS — E663 Overweight: Secondary | ICD-10-CM | POA: Insufficient documentation

## 2024-03-14 DIAGNOSIS — I2510 Atherosclerotic heart disease of native coronary artery without angina pectoris: Secondary | ICD-10-CM | POA: Diagnosis not present

## 2024-03-14 DIAGNOSIS — R0602 Shortness of breath: Secondary | ICD-10-CM | POA: Diagnosis present

## 2024-03-14 LAB — I-STAT CHEM 8, ED
BUN: 76 mg/dL — ABNORMAL HIGH (ref 8–23)
Calcium, Ion: 1.09 mmol/L — ABNORMAL LOW (ref 1.15–1.40)
Chloride: 97 mmol/L — ABNORMAL LOW (ref 98–111)
Creatinine, Ser: 3.7 mg/dL — ABNORMAL HIGH (ref 0.61–1.24)
Glucose, Bld: 134 mg/dL — ABNORMAL HIGH (ref 70–99)
HCT: 47 % (ref 39.0–52.0)
Hemoglobin: 16 g/dL (ref 13.0–17.0)
Potassium: 4.4 mmol/L (ref 3.5–5.1)
Sodium: 138 mmol/L (ref 135–145)
TCO2: 30 mmol/L (ref 22–32)

## 2024-03-14 LAB — CBC WITH DIFFERENTIAL/PLATELET
Abs Immature Granulocytes: 0.06 K/uL (ref 0.00–0.07)
Basophils Absolute: 0 K/uL (ref 0.0–0.1)
Basophils Relative: 0 %
Eosinophils Absolute: 0.1 K/uL (ref 0.0–0.5)
Eosinophils Relative: 1 %
HCT: 44.5 % (ref 39.0–52.0)
Hemoglobin: 14.6 g/dL (ref 13.0–17.0)
Immature Granulocytes: 1 %
Lymphocytes Relative: 7 %
Lymphs Abs: 0.9 K/uL (ref 0.7–4.0)
MCH: 31.7 pg (ref 26.0–34.0)
MCHC: 32.8 g/dL (ref 30.0–36.0)
MCV: 96.5 fL (ref 80.0–100.0)
Monocytes Absolute: 1 K/uL (ref 0.1–1.0)
Monocytes Relative: 8 %
Neutro Abs: 10.1 K/uL — ABNORMAL HIGH (ref 1.7–7.7)
Neutrophils Relative %: 83 %
Platelets: 320 K/uL (ref 150–400)
RBC: 4.61 MIL/uL (ref 4.22–5.81)
RDW: 14.5 % (ref 11.5–15.5)
WBC: 12 K/uL — ABNORMAL HIGH (ref 4.0–10.5)
nRBC: 0 % (ref 0.0–0.2)

## 2024-03-14 LAB — GLUCOSE, CAPILLARY
Glucose-Capillary: 127 mg/dL — ABNORMAL HIGH (ref 70–99)
Glucose-Capillary: 187 mg/dL — ABNORMAL HIGH (ref 70–99)

## 2024-03-14 LAB — COMPREHENSIVE METABOLIC PANEL WITH GFR
ALT: 17 U/L (ref 0–44)
AST: 26 U/L (ref 15–41)
Albumin: 4.2 g/dL (ref 3.5–5.0)
Alkaline Phosphatase: 75 U/L (ref 38–126)
Anion gap: 19 — ABNORMAL HIGH (ref 5–15)
BUN: 86 mg/dL — ABNORMAL HIGH (ref 8–23)
CO2: 28 mmol/L (ref 22–32)
Calcium: 10 mg/dL (ref 8.9–10.3)
Chloride: 93 mmol/L — ABNORMAL LOW (ref 98–111)
Creatinine, Ser: 3.62 mg/dL — ABNORMAL HIGH (ref 0.61–1.24)
GFR, Estimated: 15 mL/min — ABNORMAL LOW (ref >60)
Glucose, Bld: 126 mg/dL — ABNORMAL HIGH (ref 70–99)
Potassium: 4.5 mmol/L (ref 3.5–5.1)
Sodium: 140 mmol/L (ref 135–145)
Total Bilirubin: 0.9 mg/dL (ref 0.0–1.2)
Total Protein: 9.3 g/dL — ABNORMAL HIGH (ref 6.5–8.1)

## 2024-03-14 LAB — TROPONIN I (HIGH SENSITIVITY)
Troponin I (High Sensitivity): 123 ng/L (ref <18)
Troponin I (High Sensitivity): 98 ng/L — ABNORMAL HIGH (ref <18)
Troponin I (High Sensitivity): 133 ng/L (ref <18)

## 2024-03-14 LAB — I-STAT CG4 LACTIC ACID, ED
Lactic Acid, Venous: 3.2 mmol/L (ref 0.5–1.9)
Lactic Acid, Venous: 2.4 mmol/L (ref 0.5–1.9)

## 2024-03-14 LAB — MAGNESIUM: Magnesium: 2 mg/dL (ref 1.7–2.4)

## 2024-03-14 LAB — RESP PANEL BY RT-PCR (RSV, FLU A&B, COVID)  RVPGX2
Influenza A by PCR: NEGATIVE
Influenza B by PCR: NEGATIVE
Resp Syncytial Virus by PCR: NEGATIVE
SARS Coronavirus 2 by RT PCR: NEGATIVE

## 2024-03-14 LAB — BRAIN NATRIURETIC PEPTIDE: B Natriuretic Peptide: 144.8 pg/mL — ABNORMAL HIGH (ref 0.0–100.0)

## 2024-03-14 LAB — PROCALCITONIN: Procalcitonin: 0.2 ng/mL

## 2024-03-14 MED ORDER — MORPHINE SULFATE (PF) 2 MG/ML IV SOLN: 2.0000 mg | INTRAVENOUS | Status: DC | PRN

## 2024-03-14 MED ORDER — LEVOTHYROXINE SODIUM 25 MCG PO TABS
25.0000 ug | ORAL_TABLET | Freq: Every day | ORAL | Status: DC
  Administered 2024-03-15: 25 ug via ORAL
  Filled 2024-03-14: qty 1

## 2024-03-14 MED ORDER — TAMSULOSIN HCL 0.4 MG PO CAPS
0.4000 mg | ORAL_CAPSULE | Freq: Every day | ORAL | Status: DC
  Administered 2024-03-15: 0.4 mg via ORAL
  Filled 2024-03-14: qty 1

## 2024-03-14 MED ORDER — SODIUM CHLORIDE 0.9% FLUSH
3.0000 mL | Freq: Two times a day (BID) | INTRAVENOUS | Status: DC
  Administered 2024-03-14 – 2024-03-15 (×2): 3 mL via INTRAVENOUS

## 2024-03-14 MED ORDER — ALLOPURINOL 100 MG PO TABS
100.0000 mg | ORAL_TABLET | Freq: Every day | ORAL | Status: DC
  Administered 2024-03-15: 100 mg via ORAL
  Filled 2024-03-14: qty 1

## 2024-03-14 MED ORDER — HYDRALAZINE HCL 20 MG/ML IJ SOLN: 5.0000 mg | INTRAMUSCULAR | Status: DC | PRN

## 2024-03-14 MED ORDER — ACETAMINOPHEN 650 MG RE SUPP: 650.0000 mg | Freq: Four times a day (QID) | RECTAL | Status: DC | PRN

## 2024-03-14 MED ORDER — PANTOPRAZOLE SODIUM 40 MG IV SOLR
40.0000 mg | Freq: Two times a day (BID) | INTRAVENOUS | Status: DC
  Administered 2024-03-14 – 2024-03-15 (×2): 40 mg via INTRAVENOUS
  Filled 2024-03-14 (×2): qty 10

## 2024-03-14 MED ORDER — APIXABAN 2.5 MG PO TABS
2.5000 mg | ORAL_TABLET | Freq: Two times a day (BID) | ORAL | Status: DC
  Administered 2024-03-14 – 2024-03-15 (×2): 2.5 mg via ORAL
  Filled 2024-03-14 (×2): qty 1

## 2024-03-14 MED ORDER — ATORVASTATIN CALCIUM 80 MG PO TABS
80.0000 mg | ORAL_TABLET | Freq: Every day | ORAL | Status: DC
  Administered 2024-03-15: 80 mg via ORAL
  Filled 2024-03-14: qty 1

## 2024-03-14 MED ORDER — INSULIN ASPART 100 UNIT/ML IJ SOLN
0.0000 [IU] | Freq: Three times a day (TID) | INTRAMUSCULAR | Status: DC
  Filled 2024-03-14: qty 2

## 2024-03-14 MED ORDER — HYDROCODONE-ACETAMINOPHEN 5-325 MG PO TABS
1.0000 | ORAL_TABLET | ORAL | Status: DC | PRN
  Administered 2024-03-15: 1 via ORAL
  Filled 2024-03-14: qty 1

## 2024-03-14 MED ORDER — ACETAMINOPHEN 325 MG PO TABS
650.0000 mg | ORAL_TABLET | Freq: Four times a day (QID) | ORAL | Status: DC | PRN
  Administered 2024-03-14: 650 mg via ORAL
  Filled 2024-03-14: qty 2

## 2024-03-14 MED ORDER — EZETIMIBE 10 MG PO TABS
10.0000 mg | ORAL_TABLET | Freq: Every day | ORAL | Status: DC
  Administered 2024-03-15: 10 mg via ORAL
  Filled 2024-03-14: qty 1

## 2024-03-14 MED ORDER — SODIUM CHLORIDE 0.9 % IV SOLN: Freq: Once | INTRAVENOUS | Status: AC

## 2024-03-14 NOTE — Consult Note (Addendum)
 Cardiology Consultation   Patient ID: Kevin Schwartz MRN: [id number]; DOB: [birth year]  Admit date: 03/14/2024 Date of Consult: 03/14/2024  PCP:  Sherlynn Madden, MD   [facility] Providers Cardiologist:  Toribio Fuel, MD      Patient Profile: Kevin Schwartz is a 89 y.o. male with a hx of CHF (EF 45-50% in January of 2025), A-flutter on Eliquis , CAD S/P stent in 2022, CKD, DMII, HTN, HLD, RAS S/P stent of L renal artery in 2008,  who is being seen 03/14/2024 for the evaluation of chest pain at the request of Dr. Maude Galloway.  History of Present Illness: Kevin Schwartz presented with shortness of breath and chest discomfort. He had told the ED physician that he had left sided chest pain, however to me, he only notes shoulder pain. Repeatedly denied any chest pain in the last 2 day. Feels somewhat short of breath but this has improved with oxygen. His shoulder pain is exacerbated with activity.  Admitted on in 2022 with an NSTEMI and underwent PCI with DES to the mid LAD, balloon angioplasty left circumflex. Noted Mobitz type 1 block. Last admitted in 2024 for acute on chronic heart failure. He lives at [facility]. Weight 89.4 kgs in January 2025. Home medications Eliquis  2.5mg  BID, Atorvastatin  80mg , Jardiance  10mg  every other day, Torsemide  100mg  every morning, Zetia  10mg  daily   Past Medical History:  Diagnosis Date   Anemia due to chronic kidney disease    Bilateral inguinal hernia    CAD (coronary artery disease) cardiologist-  dr bensimhon   PTCA of OM in 1998, BMS OM in 2000, Cath 9/07 LM ok LAD ok. LCX 95% in OM prior to previous stent RCA. nondominant normal EF normal. Cypher DES to OM 2007 (PLACED PROXIAMAL TO PREVIOUS STENT)   Chronic kidney disease, stage III (moderate) (HCC)    nephrologist-  dr detarding   Depression    Diabetes mellitus type 2, diet-controlled (HCC)    followed by pcp,  last A1c 5.2 on 09-10-2017 in epic   Gout,  unspecified    10-29-2017  per pt stable   HLD (hyperlipidemia)    HTN (hypertension)    MGUS (monoclonal gammopathy of unknown significance) previously followed by dr freddie , arnetta and released 08/01/2011   IgG kappa dx 2002 8% plasma cells in bone marrow; no lesions on bone X-rays;   Nocturia    OA (osteoarthritis)    OSA on CPAP    per study 01-30-2004  severe osa , AHI 51.6/hr   PAD (peripheral artery disease)    05-21-2006  left renal artery stenosis, s/p balloon angioplasty and stenting;  last duplex 02/ 2012  normal , arteries patent   S/P coronary artery stent placement    2000--  BMS x1  to OM;   2007-- DES x1  to OM proximal to previous stent   Secondary hyperparathyroidism of renal origin    Urgency of urination    Wears glasses     Past Surgical History:  Procedure Laterality Date   CARDIOVASCULAR STRESS TEST  2010   per dr bensimhon epic note dated 04-18-2016  normal   CORONARY ANGIOPLASTY  1998   PTCA to OM   CORONARY ANGIOPLASTY WITH STENT PLACEMENT  2000   PCI and BMS x1 OM   CORONARY ANGIOPLASTY WITH STENT PLACEMENT  12-18-2005  dr burnard   DES x1 to proximal to previously placed stent in OM   CORONARY BALLOON ANGIOPLASTY N/A 08/02/2020   Procedure:  CORONARY BALLOON ANGIOPLASTY;  Surgeon: Burnard Debby LABOR, MD;  Location: [facility] INVASIVE CV LAB;  Service: Cardiovascular;  Laterality: N/A;   CORONARY STENT INTERVENTION N/A 08/02/2020   Procedure: CORONARY STENT INTERVENTION;  Surgeon: Burnard Debby LABOR, MD;  Location: [facility] INVASIVE CV LAB;  Service: Cardiovascular;  Laterality: N/A;   HERNIA REPAIR     INGUINAL HERNIA REPAIR Bilateral 10/30/2017   Procedure: LAPAROSCOPIC  BILATERAL INGUINAL HERNIA REPAIR  AND LEFT FEMERAL HERNIA REPAIR;  Surgeon: Sheldon Standing, MD;  Location: [facility];  Service: General;  Laterality: Bilateral;   INSERTION OF MESH Bilateral 10/30/2017   Procedure: INSERTION OF MESH;  Surgeon: Sheldon Standing, MD;  Location: [facility];  Service: General;  Laterality: Bilateral;   LEFT HEART CATH AND CORONARY ANGIOGRAPHY N/A 08/01/2020   Procedure: LEFT HEART CATH AND CORONARY ANGIOGRAPHY;  Surgeon: Burnard Debby LABOR, MD;  Location: [facility] INVASIVE CV LAB;  Service: Cardiovascular;  Laterality: N/A;   RENAL ANGIOPLASTY Left 05-21-2006   dr berry   and stenting   TOTAL KNEE ARTHROPLASTY Left 07-22-2005   dr amy  [facility]   TRANSTHORACIC ECHOCARDIOGRAM  04/18/2016   ef 50-55%, grade 1 diastolic dysfunction/  mild to moderate AR/  mild MR     Home Medications:  Prior to Admission medications   Medication Sig Start Date End Date Taking? Authorizing Provider  acetaminophen  (TYLENOL ) 500 MG tablet Take 500 mg by mouth every 6 (six) hours as needed.    [provider]  allopurinol  (ZYLOPRIM ) 100 MG tablet Take 1 tablet (100 mg total) by mouth daily. 01/31/21   Cleotilde Garnette HERO, MD  apixaban  (ELIQUIS ) 2.5 MG TABS tablet Take 2.5 mg by mouth 2 (two) times daily.    [provider]  atorvastatin  (LIPITOR ) 80 MG tablet Take 1 tablet (80 mg total) by mouth daily. 01/31/21 08/20/23  Ngetich, Dinah C, NP  cetirizine (ZYRTEC) 10 MG tablet Take 10 mg by mouth daily.    [provider]  diclofenac  Sodium (VOLTAREN ) 1 % GEL Apply 2 g topically 3 (three) times daily.    [provider]  empagliflozin  (JARDIANCE ) 10 MG TABS tablet Patient takes 1 tablet by mouth every other day.    [provider]  ezetimibe  (ZETIA ) 10 MG tablet Take 10 mg by mouth daily.    [provider]  ferrous sulfate 325 (65 FE) MG EC tablet Take 325 mg by mouth daily.    [provider]  levothyroxine  (SYNTHROID ) 25 MCG tablet Take 25 mcg by mouth daily before breakfast.    [provider]  Menthol, Topical Analgesic, (BIOFREEZE) 4 % GEL Apply 1 application  topically in the morning and at bedtime. To right wrist/hand    [provider]  Menthol, Topical Analgesic, (EUCERIN ITCH RELIEF)  0.1 % LOTN Apply 1 application  topically daily. To rash on chest/abdomin/back/neck/shoulder    [provider]  Multiple Vitamin (MULTIVITAMIN) tablet Take 1 tablet by mouth daily.    [provider]  pantoprazole  (PROTONIX ) 40 MG tablet Take 1 tablet (40 mg total) by mouth daily. 03/25/23   Craig Alan SAUNDERS, PA-C  Petrolatum  Hydrophilic OINT Apply 1 application  topically at bedtime. To bilateral nares    [provider]  Potassium Chloride  ER 20 MEQ TBCR Take 40 mEq by mouth daily. 02/03/23   [provider]  Skin Protectants, Misc. (EUCERIN) cream Apply 1 Application topically daily.    [provider]  sodium chloride  (OCEAN) 0.65 %  SOLN nasal spray Place 1 spray into both nostrils daily.    [provider]  tamsulosin  (FLOMAX ) 0.4 MG CAPS capsule Take 0.4 mg by mouth daily. 06/24/21   [provider]  White Petrolatum  (VASELINE EX) Apply 1 application  topically at bedtime. To bilateral nares    [provider]  Zinc Oxide 25 % PSTE Apply 1 application  topically 2 (two) times daily. To bottom during incontinence care    [provider]      Allergies:    Allergies  Allergen Reactions   Cozaar  [Losartan  Potassium] Swelling and Other (See Comments)    Angioedema    Zestril  [Lisinopril ] Swelling and Other (See Comments)    Angioedema    Crestor  [Rosuvastatin ] Swelling and Other (See Comments)    Angioedema    Tape Other (See Comments)    Sensitive skin, tears easily.    Social History:   Social History   Socioeconomic History   Marital status: Widowed    Spouse name: Not on file   Number of children: 1   Years of education: Not on file   Highest education level: Not on file  Occupational History   Occupation: retired   Tobacco Use   Smoking status: Former    Current packs/day: 0.25    Types: Cigarettes   Smokeless tobacco: Never   Tobacco comments:    10-29-2017  per pt was smoking  1pp2d, stopped June 2019  Vaping Use   Vaping status: Never Used  Substance and Sexual Activity   Alcohol use: Not Currently   Drug use: No   Sexual activity: Not on file  Other Topics Concern   Not on file  Social History Narrative   Widowed 02/2012   Lives along   Stopped smoking 2004   Alcohol none   Exercise works in Administrator, [employer]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    Social Drivers of Corporate Investment Banker Strain: Low Risk  (05/25/2017)   Overall Financial Resource Strain (CARDIA)    Difficulty of Paying Living Expenses: Not hard at all  Food Insecurity: No Food Insecurity (05/25/2017)   Hunger Vital Sign    Worried About Running Out of Food in the Last Year: Never true    Ran Out of Food in the Last Year: Never true  Transportation Needs: No Transportation Needs (05/25/2017)   PRAPARE - Administrator, Civil Service (Medical): No    Lack of Transportation (Non-Medical): No  Physical Activity: Insufficiently Active (05/25/2017)   Exercise Vital Sign    Days of Exercise per Week: 3 days    Minutes of Exercise per Session: 20 min  Stress: Stress Concern Present (05/25/2017)   [employer] of Occupational Health - Occupational Stress Questionnaire    Feeling of Stress : Very much  Social Connections: Somewhat Isolated (05/25/2017)   Social Connection and Isolation Panel    Frequency of Communication with Friends and Family: More than three times a week    Frequency of Social Gatherings with Friends and Family: More than three times a week    Attends Religious Services: More than 4 times per year    Active Member of [employer] or Organizations: No    Attends Banker Meetings: Never    Marital Status: Widowed  Intimate Partner Violence: Not At Risk (05/25/2017)   Humiliation, Afraid, Rape, and Kick questionnaire    Fear of Current or Ex-Partner: No    Emotionally Abused: No  Physically Abused: No    Sexually Abused: No    Family History:   Family History   Problem Relation Age of Onset   Heart disease Mother    Healthy Father    Heart disease Sister    Heart disease Sister    Cancer Sister        type unknown   Hypertension Sister    Diabetes Brother    Diabetes Brother    Diabetes Other    Coronary artery disease Other    Cancer Other    Colon cancer Neg Hx    Esophageal cancer Neg Hx    Pancreatic cancer Neg Hx    Stomach cancer Neg Hx      ROS:  Please see the history of present illness.   All other ROS reviewed and negative.     Physical Exam/Data: Vitals:   03/14/24 1423 03/14/24 1427 03/14/24 1428 03/14/24 1534  BP: (!) 144/56 (!) 134/56  (!) 112/58  Pulse: 84 78  77  Resp: (!) 22 20  20   Temp:  98.5 F (36.9 C)    TempSrc:  Oral    SpO2:  100%  100%  Weight:   91 kg   Height:   5' 8 (1.727 m)    No intake or output data in the 24 hours ending 03/14/24 1615    03/14/2024    2:28 PM 08/13/2023    2:08 PM 06/10/2023    1:14 PM  Last 3 Weights  Weight (lbs) 200 lb 9.9 oz 202 lb 2 oz 197 lb  Weight (kg) 91 kg 91.683 kg 89.359 kg     Body mass index is 30.5 kg/m.  General:  Elderly gentleman in NAD HEENT: normal Neck: no JVD Vascular: No carotid bruits; Distal pulses 2+ bilaterally Cardiac:  normal S1, S2; irregular rhythm, systolic murmur Lungs:  clear to auscultation bilaterally, no wheezing, rhonchi or rales  Abd: soft, nontender, no hepatomegaly  Ext: no edema. Left shoulder tender to palpation. Severely decreased active and passive range of motion. Skin: warm and dry  Psych:  Normal affect   EKG:  The EKG was personally reviewed and demonstrates:  Atrial fibrillation. Left posterior fasicular block   Relevant CV Studies: Echo 1/30 1.Left ventricular ejection fraction, by estimation, is 45 to 50% . The left ventricle has mildly decreased function. The left ventricle demonstrates global hypokinesis. There is mild left ventricular hypertrophy. Left ventricular diastolic parameters are consistent with  Grade I diastolic dysfunction ( impaired relaxation) . Elevated left ventricular end- diastolic pressure. The average left ventricular global longitudinal strain is - 9. 3 % . The global longitudinal strain is abnormal.  2. Right ventricular systolic function is normal. The right ventricular size is normal. There is normal pulmonary artery systolic pressure.  3. Left atrial size was severely dilated.  4. The mitral valve is degenerative. Moderate mitral valve regurgitation. Mild mitral stenosis.  5. The aortic valve is normal in structure. There is moderate calcification of the aortic valve. There is moderate thickening of the aortic valve. Aortic valve regurgitation is mild to moderate. No aortic stenosis is present.  6. The inferior vena cava is normal in size with greater  Laboratory Data: High Sensitivity Troponin:   Recent Labs  Lab 03/14/24 1445  TROPONINIHS 123*      Latest Reference Range & Units 03/14/24 14:51 03/14/24 16:41  Lactic Acid, Venous 0.5 - 1.9 mmol/L 3.2 (HH) 2.4 (HH)  (HH): Data is critically high Chemistry Recent  Labs  Lab 03/14/24 1445 03/14/24 1451  NA 140 138  K 4.5 4.4  CL 93* 97*  CO2 28  --   GLUCOSE 126* 134*  BUN 86* 76*  CREATININE 3.62* 3.70*  CALCIUM  10.0  --   GFRNONAA 15*  --   ANIONGAP 19*  --     Recent Labs  Lab 03/14/24 1445  PROT 9.3*  ALBUMIN  4.2  AST 26  ALT 17  ALKPHOS 75  BILITOT 0.9   Hematology Recent Labs  Lab 03/14/24 1445 03/14/24 1451  WBC 12.0*  --   RBC 4.61  --   HGB 14.6 16.0  HCT 44.5 47.0  MCV 96.5  --   MCH 31.7  --   MCHC 32.8  --   RDW 14.5  --   PLT 320  --    BNP Recent Labs  Lab 03/14/24 1445  BNP 144.8*     Radiology/Studies:  DG Chest Port 1 View Result Date: 03/14/2024 CLINICAL DATA:  Shortness of breath EXAM: PORTABLE CHEST 1 VIEW COMPARISON:  Chest x-ray 05/14/2022 FINDINGS: The heart size and mediastinal contours are within normal limits. Both lungs are clear. The visualized  skeletal structures are unremarkable. IMPRESSION: No active disease. Electronically Signed   By: Greig Pique M.D.   On: 03/14/2024 15:32     Assessment and Plan: Type II NSTEMI Elevated Troponin CAD Troponin elevated to 123 in the setting of known ischemic disease and non-revascularized RCA. Likely demand ischemia. Also with CKD which decreases the clearance of his troponin. No new ischemic changes on EKG. While he does have shortness of breath, he is reportedly chest pain free and has been so for two days, reporting only left shoulder pain. He can continue his home reduced dose Eliquis .  -Trend troponin to peak -EKG if he develops chest pain -Continue Eliquis  2.5mg  BID for anticoagulation -Atorvastatin  80mg  daily -Ezetimibe  10mg  daily  A-fib Known history of A-fib. Rates well controlled presently. On Eliquis  2.5mg  BID at home  -Continue home Eliquis   HFmrEF (45-50%) Last echo from 05/14/2023 with impaired diastolic function and mildly reduced EF with mitral stenosis. No signs of volume overload on exam or x-ray. Lactic acidosis does not appear to be secondary to cardiogenic shock, and is downtrending.  -Hold home Jardiance  in the setting decreased creatinine clearance -Hold Torsemide  in the setting of decreased GFR  Left shoulder pain Appears mechanical rather than cardiogenic. Decreased ROM with increased pain with ROM, defer to primary team.  Per primary team AKI on CKD Lactic acidosis Hypothyroidism DMII Left shoulder pain   For questions or updates, please contact [facility] Please consult [email or web address] for contact info under    Signed, Melvenia Morrison, MD PGY-1 Internal Medicine Cardiology service 03/14/2024 4:15 PM   ATTENDING ATTESTATION:  After conducting a review of all available clinical information with the care team, interviewing the patient, and performing a physical exam, I agree with the findings and plan described in this note.    Patient is a fairly robust 89 year old male with a history of heart failure with mildly reduced ejection fraction, atrial flutter on low-dose Eliquis , coronary artery disease status post PCI of the mid LAD in 2022 and POBA of ISR of left circumflex at the same time, CKD stage IIIb/IV, renal artery stenosis status post left renal artery stent in 2008 who is here with left shoulder pain.  The patient reportedly developed left shoulder pain a few days ago.  He denies any chest pain.  His  shoulder pain is exacerbated by movement.  His shoulder is tender to palpation.  In the emergency department his vital signs were stable.  His lactate was elevated at 3.2 but is downtrending to 2.4.  His creatinine was elevated to 3.6 which is above his baseline.  His troponins were mildly elevated at 128.  His EKG demonstrates atrial fibrillation with left posterior fascicular block.  Given AKI would hold home torsemide  and Jardiance .  Trend troponins.  Continue Eliquis  2.5 mg twice daily I do not think an ischemic evaluation is required at this point in time.  Believe his lactic acid is likely due to his acute kidney injury.  Elevated troponin likely due to type II MI in setting of AKI and on revascularized RCA.  GEN: No acute distress, AO x 3 HEENT:  MMM, no JVD, no scleral icterus Cardiac: Irregular RRR, no murmurs, rubs, or gallops.  Respiratory: Clear to auscultation bilaterally. GI: Soft, nontender, non-distended  MS: No edema; No deformity.  Warm and well-perfused Neuro:  Nonfocal  Vasc:  +2 radial pulses    Lurena Red, MD Pager [phone]

## 2024-03-14 NOTE — Plan of Care (Signed)

## 2024-03-14 NOTE — Progress Notes (Signed)
 Called Lab per request of Dr. Tobie to add on a Magnesium level.

## 2024-03-14 NOTE — ED Provider Notes (Signed)
 [place] EMERGENCY DEPARTMENT AT [facility] Provider Note   CSN: [id number] Arrival date & time: 03/14/24  1406     Patient presents with: Shortness of Breath   Kevin Schwartz is a 89 y.o. male.   89 year old male presents with complaint of shortness of breath and left-sided chest discomfort.  Symptoms began yesterday.  He uses 2 L nasal cannula O2 for sleeping.  He has been wearing 2 to 3 L nasal cannula this morning without improvement in symptoms.  He denies fever.  He denies cough.  Prior medical history includes DM2, HTN, HL, mild renal insufficiency, RAS s/p stenting of L renal artery in 2008, CAD s/p multiple PCIs.  The history is provided by the patient and medical records.       Prior to Admission medications   Medication Sig Start Date End Date Taking? Authorizing Provider  acetaminophen  (TYLENOL ) 500 MG tablet Take 500 mg by mouth every 6 (six) hours as needed.    [provider]  allopurinol  (ZYLOPRIM ) 100 MG tablet Take 1 tablet (100 mg total) by mouth daily. 01/31/21   Cleotilde Garnette HERO, MD  apixaban  (ELIQUIS ) 2.5 MG TABS tablet Take 2.5 mg by mouth 2 (two) times daily.    [provider]  atorvastatin  (LIPITOR ) 80 MG tablet Take 1 tablet (80 mg total) by mouth daily. 01/31/21 08/20/23  Ngetich, Dinah C, NP  cetirizine (ZYRTEC) 10 MG tablet Take 10 mg by mouth daily.    [provider]  diclofenac  Sodium (VOLTAREN ) 1 % GEL Apply 2 g topically 3 (three) times daily.    [provider]  empagliflozin  (JARDIANCE ) 10 MG TABS tablet Patient takes 1 tablet by mouth every other day.    [provider]  ezetimibe  (ZETIA ) 10 MG tablet Take 10 mg by mouth daily.    [provider]  ferrous sulfate 325 (65 FE) MG EC tablet Take 325 mg by mouth daily.    [provider]  levothyroxine  (SYNTHROID ) 25 MCG tablet Take 25 mcg by mouth daily before breakfast.    [provider]  Menthol,  Topical Analgesic, (BIOFREEZE) 4 % GEL Apply 1 application  topically in the morning and at bedtime. To right wrist/hand    [provider]  Menthol, Topical Analgesic, (EUCERIN ITCH RELIEF) 0.1 % LOTN Apply 1 application  topically daily. To rash on chest/abdomin/back/neck/shoulder    [provider]  Multiple Vitamin (MULTIVITAMIN) tablet Take 1 tablet by mouth daily.    [provider]  pantoprazole  (PROTONIX ) 40 MG tablet Take 1 tablet (40 mg total) by mouth daily. 03/25/23   Craig Alan SAUNDERS, PA-C  Petrolatum  Hydrophilic OINT Apply 1 application  topically at bedtime. To bilateral nares    [provider]  Potassium Chloride  ER 20 MEQ TBCR Take 40 mEq by mouth daily. 02/03/23   [provider]  Skin Protectants, Misc. (EUCERIN) cream Apply 1 Application topically daily.    [provider]  sodium chloride  (OCEAN) 0.65 % SOLN nasal spray Place 1 spray into both nostrils daily.    [provider]  tamsulosin  (FLOMAX ) 0.4 MG CAPS capsule Take 0.4 mg by mouth daily. 06/24/21   [provider]  White Petrolatum  (VASELINE EX) Apply 1 application  topically at bedtime. To bilateral nares    [provider]  Zinc Oxide 25 % PSTE Apply 1 application  topically 2 (two) times daily. To bottom during incontinence care    [provider]  Allergies: Cozaar  [losartan  potassium], Zestril  [lisinopril ], Crestor  [rosuvastatin ], and Tape    Review of Systems  All other systems reviewed and are negative.   Updated Vital Signs There were no vitals taken for this visit.  Physical Exam Vitals and nursing note reviewed.  Constitutional:      General: He is not in acute distress.    Appearance: Normal appearance. He is well-developed.  HENT:     Head: Normocephalic and atraumatic.  Eyes:     Conjunctiva/sclera: Conjunctivae normal.     Pupils: Pupils are equal, round, and reactive to light.  Cardiovascular:      Rate and Rhythm: Normal rate and regular rhythm.     Heart sounds: Normal heart sounds.  Pulmonary:     Effort: Pulmonary effort is normal. No respiratory distress.     Comments: Decreased breath sounds at bilateral bases Abdominal:     General: There is no distension.     Palpations: Abdomen is soft.     Tenderness: There is no abdominal tenderness.  Musculoskeletal:        General: No deformity. Normal range of motion.     Cervical back: Normal range of motion and neck supple.  Skin:    General: Skin is warm and dry.  Neurological:     General: No focal deficit present.     Mental Status: He is alert and oriented to person, place, and time.     (all labs ordered are listed, but only abnormal results are displayed) Labs Reviewed  RESP PANEL BY RT-PCR (RSV, FLU A&B, COVID)  [id number]  CBC WITH DIFFERENTIAL/PLATELET  COMPREHENSIVE METABOLIC PANEL WITH GFR  BRAIN NATRIURETIC PEPTIDE  I-STAT CHEM 8, ED  I-STAT CG4 LACTIC ACID, ED  TROPONIN I (HIGH SENSITIVITY)    EKG: None  Radiology: No results found.   Procedures   Medications Ordered in the ED - No data to display                                  Medical Decision Making Patient complains of increasing shortness of breath x 24 hours.  He complains of associated left shoulder and chest discomfort.  Chest discomfort began yesterday.  EKG is without acute ischemic change.  Troponin is initially 123.  Creatinine is elevated to 3.6-7 above baseline (3.1).  Patient without clear evidence of pulmonary edema on chest x-ray.  BNP is 144.  Lactic acid is mildly elevated at 3.2 with a white count of 12.  Case discussed briefly with cardiology master, Carley.  Cardiology will see in consultation.  Hospitalist service made aware of case and need for admission.  Amount and/or Complexity of Data Reviewed Labs: ordered. Radiology: ordered.  CRITICAL CARE Performed by: Maude JAYSON Galloway   Total critical care time: 30  minutes  Critical care time was exclusive of separately billable procedures and treating other patients.  Critical care was necessary to treat or prevent imminent or life-threatening deterioration.  Critical care was time spent personally by me on the following activities: development of treatment plan with patient and/or surrogate as well as nursing, discussions with consultants, evaluation of patient's response to treatment, examination of patient, obtaining history from patient or surrogate, ordering and performing treatments and interventions, ordering and review of laboratory studies, ordering and review of radiographic studies, pulse oximetry and re-evaluation of patient's condition.       Final diagnoses:  Dyspnea, unspecified type  Chest  pain, unspecified type  Elevated troponin  AKI (acute kidney injury)    ED Discharge Orders     None          Laurice Maude BROCKS, MD 03/14/24 [phone]

## 2024-03-14 NOTE — ED Triage Notes (Addendum)
 Pt to ED via EMS with c/o SOB and left shoulder pain since yesterday. Pt on RA baseline, wears 2L nasal cannula at bedtime. EMS reports 96% RA, 99% on 2L nasal cannula. Pt arrives on 2L nasal cannula. Pt A&Ox4.

## 2024-03-14 NOTE — H&P (Signed)
 History and Physical    Patient: Kevin Schwartz FMW:[id number] DOB: 1930/10/17 DOA: 03/14/2024 DOS: the patient was seen and examined on 03/14/2024 . PCP: Sherlynn Madden, MD  Patient coming from: Home Chief complaint: Chief Complaint  Patient presents with   Shortness of Breath   HPI:  Kevin Schwartz is a 89 y.o. male with past medical history  of  non-STEMI, peripheral artery disease, CKD, type 2 diabetes, hypertension hyperlipidemia and A-fib on Eliquis  , Patient brought by EMS from home for complaints of shortness of breath and left shoulder pain since yesterday evening.  He does wear 2 L nasal cannula at bedtime only on initial EMS evaluation patient was O2 sats of 96% on room air and 99 on oxygen patient is alert awake oriented afebrile on initial presentation.    PTA meds include allopurinol  100 mg, apixaban  2.5 twice daily, Lipitor  80, Jardiance  10, Synthroid  25, Protonix  40 mg, Flomax , med reconciliation pending.   ED Course:  Vitals since arrival show patient is afebrile respiratory rate 22 initially O2 sats 100% on 2 L nasal cannula. Vitals:   03/14/24 1428 03/14/24 1534 03/14/24 1600 03/14/24 1800  BP:  (!) 112/58 (!) 118/51 (!) 148/66  Pulse:  77 81 74  Temp:    98.9 F (37.2 C)  Resp:  20 16 18   Height: 5' 8 (1.727 m)     Weight: 91 kg     SpO2:  100% 100% 100%  TempSrc:      BMI (Calculated): 30.51      >>ED evaluation thus far shows:  - Initial CMP shows glucose 126 BUN of 86 acute worsening of creatinine at 3.62 eGFR 15 and baseline between 2.86 and 3.1, anion gap of 19 bicarb of 28, total protein 9.3 normal LFTs otherwise. -BNP of 144.8, initial troponin of 123, EKG showing A-fib at 91 with QRS of 107 and prolonged QT of 42, left posterior fascicular block. No acute ST-T wave changes. - 2D echo in January shows combined systolic and diastolic dysfunction with EF of 45 to 50% and grade 1 diastolic dysfunction please refer to complete  report.  -Lactic acid of 3.2 repeat ordered and pending. - CBC shows white count of 12.0 normal hemoglobin of 14.6 which I suspect is secondary to hemoconcentration, platelets of 320.  - Viral respiratory panel is ordered and pending. -CT chest without contrast ordered and pending in the ED.  - Initial chest x-ray is negative for any acute active disease. -Procalcitonin level added and pending.   >>While in the ED patient received the following: Medications - No data to display Review of Systems  Musculoskeletal:  Positive for joint pain.       Left shoulder pain    Past Medical History:  Diagnosis Date   Anemia due to chronic kidney disease    Bilateral inguinal hernia    CAD (coronary artery disease) cardiologist-  dr bensimhon   PTCA of OM in 1998, BMS OM in 2000, Cath 9/07 LM ok LAD ok. LCX 95% in OM prior to previous stent RCA. nondominant normal EF normal. Cypher DES to OM 2007 (PLACED PROXIAMAL TO PREVIOUS STENT)   Chronic kidney disease, stage III (moderate) (HCC)    nephrologist-  dr detarding   Depression    Diabetes mellitus type 2, diet-controlled (HCC)    followed by pcp,  last A1c 5.2 on 09-10-2017 in epic   Gout, unspecified    10-29-2017  per pt stable   HLD (hyperlipidemia)  HTN (hypertension)    MGUS (monoclonal gammopathy of unknown significance) previously followed by dr freddie , arnetta and released 08/01/2011   IgG kappa dx 2002 8% plasma cells in bone marrow; no lesions on bone X-rays;   Nocturia    OA (osteoarthritis)    OSA on CPAP    per study 01-30-2004  severe osa , AHI 51.6/hr   PAD (peripheral artery disease)    05-21-2006  left renal artery stenosis, s/p balloon angioplasty and stenting;  last duplex 02/ 2012  normal , arteries patent   S/P coronary artery stent placement    2000--  BMS x1  to OM;   2007-- DES x1  to OM proximal to previous stent   Secondary hyperparathyroidism of renal origin    Urgency of urination    Wears glasses     Past Surgical History:  Procedure Laterality Date   CARDIOVASCULAR STRESS TEST  2010   per dr bensimhon epic note dated 04-18-2016  normal   CORONARY ANGIOPLASTY  1998   PTCA to OM   CORONARY ANGIOPLASTY WITH STENT PLACEMENT  2000   PCI and BMS x1 OM   CORONARY ANGIOPLASTY WITH STENT PLACEMENT  12-18-2005  dr burnard   DES x1 to proximal to previously placed stent in OM   CORONARY BALLOON ANGIOPLASTY N/A 08/02/2020   Procedure: CORONARY BALLOON ANGIOPLASTY;  Surgeon: Burnard Debby LABOR, MD;  Location: [facility] INVASIVE CV LAB;  Service: Cardiovascular;  Laterality: N/A;   CORONARY STENT INTERVENTION N/A 08/02/2020   Procedure: CORONARY STENT INTERVENTION;  Surgeon: Burnard Debby LABOR, MD;  Location: [facility] INVASIVE CV LAB;  Service: Cardiovascular;  Laterality: N/A;   HERNIA REPAIR     INGUINAL HERNIA REPAIR Bilateral 10/30/2017   Procedure: LAPAROSCOPIC  BILATERAL INGUINAL HERNIA REPAIR  AND LEFT FEMERAL HERNIA REPAIR;  Surgeon: Sheldon Standing, MD;  Location: [facility] [place];  Service: General;  Laterality: Bilateral;   INSERTION OF MESH Bilateral 10/30/2017   Procedure: INSERTION OF MESH;  Surgeon: Sheldon Standing, MD;  Location: [facility] [place];  Service: General;  Laterality: Bilateral;   LEFT HEART CATH AND CORONARY ANGIOGRAPHY N/A 08/01/2020   Procedure: LEFT HEART CATH AND CORONARY ANGIOGRAPHY;  Surgeon: Burnard Debby LABOR, MD;  Location: [facility] INVASIVE CV LAB;  Service: Cardiovascular;  Laterality: N/A;   RENAL ANGIOPLASTY Left 05-21-2006   dr berry   and stenting   TOTAL KNEE ARTHROPLASTY Left 07-22-2005   dr amy  [facility]   TRANSTHORACIC ECHOCARDIOGRAM  04/18/2016   ef 50-55%, grade 1 diastolic dysfunction/  mild to moderate AR/  mild MR    reports that he has quit smoking. His smoking use included cigarettes. He has never used smokeless tobacco. He reports that he does not currently use alcohol. He reports that he does not use drugs. Allergies  Allergen Reactions   Cozaar   [Losartan  Potassium] Swelling and Other (See Comments)    Angioedema    Zestril  [Lisinopril ] Swelling and Other (See Comments)    Angioedema    Crestor  [Rosuvastatin ] Swelling and Other (See Comments)    Angioedema    Tape Other (See Comments)    Sensitive skin, tears easily.   Family History  Problem Relation Age of Onset   Heart disease Mother    Healthy Father    Heart disease Sister    Heart disease Sister    Cancer Sister        type unknown   Hypertension Sister    Diabetes Brother  Diabetes Brother    Diabetes Other    Coronary artery disease Other    Cancer Other    Colon cancer Neg Hx    Esophageal cancer Neg Hx    Pancreatic cancer Neg Hx    Stomach cancer Neg Hx    Prior to Admission medications   Medication Sig Start Date End Date Taking? Authorizing Provider  acetaminophen  (TYLENOL ) 500 MG tablet Take 500 mg by mouth every 6 (six) hours as needed.    [provider]  allopurinol  (ZYLOPRIM ) 100 MG tablet Take 1 tablet (100 mg total) by mouth daily. 01/31/21   Cleotilde Garnette HERO, MD  apixaban  (ELIQUIS ) 2.5 MG TABS tablet Take 2.5 mg by mouth 2 (two) times daily.    [provider]  atorvastatin  (LIPITOR ) 80 MG tablet Take 1 tablet (80 mg total) by mouth daily. 01/31/21 08/20/23  Ngetich, Dinah C, NP  cetirizine (ZYRTEC) 10 MG tablet Take 10 mg by mouth daily.    [provider]  diclofenac  Sodium (VOLTAREN ) 1 % GEL Apply 2 g topically 3 (three) times daily.    [provider]  empagliflozin  (JARDIANCE ) 10 MG TABS tablet Patient takes 1 tablet by mouth every other day.    [provider]  ezetimibe  (ZETIA ) 10 MG tablet Take 10 mg by mouth daily.    [provider]  ferrous sulfate 325 (65 FE) MG EC tablet Take 325 mg by mouth daily.    [provider]  levothyroxine  (SYNTHROID ) 25 MCG tablet Take 25 mcg by mouth daily before breakfast.    [provider]  Menthol, Topical Analgesic, (BIOFREEZE)  4 % GEL Apply 1 application  topically in the morning and at bedtime. To right wrist/hand    [provider]  Menthol, Topical Analgesic, (EUCERIN ITCH RELIEF) 0.1 % LOTN Apply 1 application  topically daily. To rash on chest/abdomin/back/neck/shoulder    [provider]  Multiple Vitamin (MULTIVITAMIN) tablet Take 1 tablet by mouth daily.    [provider]  pantoprazole  (PROTONIX ) 40 MG tablet Take 1 tablet (40 mg total) by mouth daily. 03/25/23   Craig Alan SAUNDERS, PA-C  Petrolatum  Hydrophilic OINT Apply 1 application  topically at bedtime. To bilateral nares    [provider]  Potassium Chloride  ER 20 MEQ TBCR Take 40 mEq by mouth daily. 02/03/23   [provider]  Skin Protectants, Misc. (EUCERIN) cream Apply 1 Application topically daily.    [provider]  sodium chloride  (OCEAN) 0.65 % SOLN nasal spray Place 1 spray into both nostrils daily.    [provider]  tamsulosin  (FLOMAX ) 0.4 MG CAPS capsule Take 0.4 mg by mouth daily. 06/24/21   [provider]  White Petrolatum  (VASELINE EX) Apply 1 application  topically at bedtime. To bilateral nares    [provider]  Zinc Oxide 25 % PSTE Apply 1 application  topically 2 (two) times daily. To bottom during incontinence care    [provider]                                                                                 Vitals:   03/14/24 1428 03/14/24 1534 03/14/24  1600 03/14/24 1800  BP:  (!) 112/58 (!) 118/51 (!) 148/66  Pulse:  77 81 74  Resp:  20 16 18   Temp:    98.9 F (37.2 C)  TempSrc:      SpO2:  100% 100% 100%  Weight: 91 kg     Height: 5' 8 (1.727 m)      Physical Exam Vitals reviewed.  Constitutional:      General: He is not in acute distress.    Appearance: He is not ill-appearing.  HENT:     Head: Normocephalic.  Eyes:     Extraocular Movements: Extraocular movements intact.  Cardiovascular:     Rate and Rhythm: Normal  rate and regular rhythm.     Heart sounds: Normal heart sounds.  Pulmonary:     Breath sounds: Normal breath sounds.  Abdominal:     General: There is no distension.     Palpations: Abdomen is soft.     Tenderness: There is no abdominal tenderness.  Musculoskeletal:     Left shoulder: Tenderness present. No swelling or deformity. Decreased range of motion. Normal pulse.     Comments: Pain with abduction and ay movement.   Neurological:     General: No focal deficit present.     Mental Status: He is alert and oriented to person, place, and time.     Labs on Admission: I have personally reviewed following labs and imaging studies CBC: Recent Labs  Lab 03/14/24 1445 03/14/24 1451  WBC 12.0*  --   NEUTROABS 10.1*  --   HGB 14.6 16.0  HCT 44.5 47.0  MCV 96.5  --   PLT 320  --    Basic Metabolic Panel: Recent Labs  Lab 03/14/24 1445 03/14/24 1451  NA 140 138  K 4.5 4.4  CL 93* 97*  CO2 28  --   GLUCOSE 126* 134*  BUN 86* 76*  CREATININE 3.62* 3.70*  CALCIUM  10.0  --    GFR: Estimated Creatinine Clearance: 14.2 mL/min (A) (by C-G formula based on SCr of 3.7 mg/dL (H)). Liver Function Tests: Recent Labs  Lab 03/14/24 1445  AST 26  ALT 17  ALKPHOS 75  BILITOT 0.9  PROT 9.3*  ALBUMIN  4.2   No results for input(s): LIPASE, AMYLASE in the last 168 hours. No results for input(s): AMMONIA in the last 168 hours. Recent Labs    03/25/23 1417 04/21/23 0952 05/14/23 1229 01/13/24 0000 03/14/24 1445 03/14/24 1451  BUN 51* 51* 67* 52* 86* 76*  CREATININE 2.76* 2.86* 3.13* 3.1* 3.62* 3.70*    Cardiac Enzymes: No results for input(s): CKTOTAL, CKMB, CKMBINDEX, TROPONINI in the last 168 hours. BNP (last 3 results) Recent Labs    08/13/23 1450  PROBNP 11,290*   HbA1C: No results for input(s): HGBA1C in the last 72 hours. CBG: No results for input(s): GLUCAP in the last 168 hours. Lipid Profile: No results for input(s): CHOL, HDL,  LDLCALC, TRIG, CHOLHDL, LDLDIRECT in the last 72 hours. Thyroid  Function Tests: No results for input(s): TSH, T4TOTAL, FREET4, T3FREE, THYROIDAB in the last 72 hours. Anemia Panel: No results for input(s): VITAMINB12, FOLATE, FERRITIN, TIBC, IRON, RETICCTPCT in the last 72 hours. Urine analysis:    Component Value Date/Time   COLORURINE STRAW (A) 05/14/2022 2045   APPEARANCEUR CLEAR 05/14/2022 2045   LABSPEC 1.009 05/14/2022 2045   PHURINE 5.0 05/14/2022 2045   GLUCOSEU 50 (A) 05/14/2022 2045   HGBUR NEGATIVE 05/14/2022 2045   BILIRUBINUR NEGATIVE 05/14/2022 2045  KETONESUR NEGATIVE 05/14/2022 2045   PROTEINUR NEGATIVE 05/14/2022 2045   NITRITE NEGATIVE 05/14/2022 2045   LEUKOCYTESUR NEGATIVE 05/14/2022 2045   Radiological Exams on Admission: CT Chest Wo Contrast Result Date: 03/14/2024 CLINICAL DATA:  Respiratory illness. EXAM: CT CHEST WITHOUT CONTRAST TECHNIQUE: Multidetector CT imaging of the chest was performed following the standard protocol without IV contrast. RADIATION DOSE REDUCTION: This exam was performed according to the departmental dose-optimization program which includes automated exposure control, adjustment of the mA and/or kV according to patient size and/or use of iterative reconstruction technique. COMPARISON:  Chest CT 04/16/2021 FINDINGS: Cardiovascular: Heart is mildly enlarged. Aorta is normal in size. There are atherosclerotic calcifications of the aorta and coronary arteries. There is no pericardial effusion. Mediastinum/Nodes: No enlarged mediastinal or axillary lymph nodes. Thyroid  gland, trachea, and esophagus demonstrate no significant findings. Lungs/Pleura: There is minimal atelectasis in the lung bases. The lungs are otherwise clear. There is no pleural effusion or pneumothorax. Upper Abdomen: No acute abnormality. Musculoskeletal: No acute osseous abnormality. IMPRESSION: 1. No acute cardiopulmonary process. 2. Mild cardiomegaly.  3. Aortic atherosclerosis. Aortic Atherosclerosis (ICD10-I70.0). Electronically Signed   By: Greig Pique M.D.   On: 03/14/2024 17:12   DG Chest Port 1 View Result Date: 03/14/2024 CLINICAL DATA:  Shortness of breath EXAM: PORTABLE CHEST 1 VIEW COMPARISON:  Chest x-ray 05/14/2022 FINDINGS: The heart size and mediastinal contours are within normal limits. Both lungs are clear. The visualized skeletal structures are unremarkable. IMPRESSION: No active disease. Electronically Signed   By: Greig Pique M.D.   On: 03/14/2024 15:32   Data Reviewed: Relevant notes from primary care and specialist visits, past discharge summaries as available in EHR, including Care Everywhere . Prior diagnostic testing as pertinent to current admission diagnoses, Updated medications and problem lists for reconciliation .ED course, including vitals, labs, imaging, treatment and response to treatment,Triage notes, nursing and pharmacy notes and ED provider's notes.Notable results as noted in HPI.Discussed case with EDMD/ ED APP/ or Specialty MD on call and as needed.  Assessment & Plan    >> Left sided shoulder and chest pain: With patient's history of CAD and currently elevated troponin I am concerned that patient's shoulder pain is anginal variant symptom.  Appreciate cardiology consult and management in his case will follow repeat troponins.  Will continue patient on atorvastatin  80.  And defer to cardiology for antiplatelet therapy .we will get xray of left shoulder and follow and may need MRI imaging or orthopedic consult prior to discharge.   >>Chronic respiratory failure: On 2L at bedtime.    >>Elevated Troponin :  03/14/24 14:45  Troponin I (High Sensitivity) 123 (HH)  Troponin is down trending. 123 to 98. Attribute to his mild worsening of AKI and demand.  Will defer to cardiology for any additional recommendation.   >>AGMA: 2/2 to lactic acidosis, suspect more from hypovolemia and dehydration. We will  hold his bp meds and monitor. Cont with low rate IVF at 20 cc x 1 days. Also is hemoconcentrated.  Normal LFT, and no albuterol .  >> Chronic combined systolic and diastolic congestive heart failure: Currently will hold patient's Jardiance , and any diuretic therapy. Strict I/O and sodium restricted diet. Weights.    >>AKI on CKD stage V: Lab Results  Component Value Date   CREATININE 3.70 (H) 03/14/2024   CREATININE 3.62 (H) 03/14/2024   CREATININE 3.1 (A) 01/13/2024  Avoid contrast and renally dose meds.   >> Hypothyroidism: Continue levothyroxine  at 25 mcg.   >> Chronic A-fib:  CHA2DS2/VAS Stroke Risk Points  Current as of about an hour ago     6 >= 2 Points: High Risk  1 to 1.99 Points: Medium Risk  0 Points: Low Risk    Last Change: N/A   Continue eliquis .   >> DM II: Glycemic protocol. Swallow eval  and carb consistent diet.    DVT prophylaxis:  Eliquis .  Consults:  Cardiology.   Advance Care Planning:    Code Status: Limited: Do not attempt resuscitation (DNR) -DNR-LIMITED -Do Not Intubate/DNI    Family Communication:  None  Disposition Plan:  TBD.  Severity of Illness: The appropriate patient status for this patient is INPATIENT. Inpatient status is judged to be reasonable and necessary in order to provide the required intensity of service to ensure the patient's safety. The patient's presenting symptoms, physical exam findings, and initial radiographic and laboratory data in the context of their chronic comorbidities is felt to place them at high risk for further clinical deterioration. Furthermore, it is not anticipated that the patient will be medically stable for discharge from the hospital within 2 midnights of admission.   * I certify that at the point of admission it is my clinical judgment that the patient will require inpatient hospital care spanning beyond 2 midnights from the point of admission due to high intensity of service, high risk for further  deterioration and high frequency of surveillance required.*  Unresulted Labs (From admission, onward)     Start     Ordered   03/15/24 0500  Comprehensive metabolic panel  Tomorrow morning,   R        03/14/24 1701   03/15/24 0500  CBC  Tomorrow morning,   R        03/14/24 1701   03/15/24 0500  Magnesium  Tomorrow morning,   R        03/14/24 1750   03/14/24 1700  Magnesium  Add-on,   AD        03/14/24 1701   03/14/24 1659  Hemoglobin A1c  Once,   R       Comments: To assess prior glycemic control    03/14/24 1701   03/14/24 1646  Procalcitonin  Add-on,   AD        03/14/24 1645   03/14/24 1508  Culture, blood (routine x 2)  BLOOD CULTURE X 2,   R      03/14/24 1508            Meds ordered this encounter  Medications   allopurinol  (ZYLOPRIM ) tablet 100 mg   apixaban  (ELIQUIS ) tablet 2.5 mg   atorvastatin  (LIPITOR ) tablet 80 mg   ezetimibe  (ZETIA ) tablet 10 mg   levothyroxine  (SYNTHROID ) tablet 25 mcg   tamsulosin  (FLOMAX ) capsule 0.4 mg   pantoprazole  (PROTONIX ) injection 40 mg   insulin  aspart (novoLOG ) injection 0-15 Units    Correction coverage::   Moderate (average weight, post-op)    CBG < 70::   implement hypoglycemia protocol    CBG 70 - 120::   0 units    CBG 121 - 150::   2 units    CBG 151 - 200::   3 units    CBG 201 - 250::   5 units    CBG 251 - 300::   8 units    CBG 301 - 350::   11 units    CBG 351 - 400::   15 units    CBG > 400:   call MD  and obtain STAT lab verification   sodium chloride  flush (NS) 0.9 % injection 3 mL   OR Linked Order Group    acetaminophen  (TYLENOL ) tablet 650 mg    acetaminophen  (TYLENOL ) suppository 650 mg   HYDROcodone -acetaminophen  (NORCO/VICODIN) 5-325 MG per tablet 1 tablet    Refill:  0   morphine (PF) 2 MG/ML injection 2 mg   hydrALAZINE  (APRESOLINE ) injection 5 mg   0.9 %  sodium chloride  infusion     Orders Placed This Encounter  Procedures   Resp panel by RT-PCR (RSV, Flu A&B, Covid) Anterior Nasal Swab    Culture, blood (routine x 2)   DG Chest Port 1 View   CT Chest Wo Contrast   DG Shoulder Left Portable   CBC with Differential   Comprehensive metabolic panel   Brain natriuretic peptide   Procalcitonin   Hemoglobin A1c   Comprehensive metabolic panel   CBC   Magnesium   Magnesium   Diet Carb Modified Fluid consistency: Nectar Thick; Room service appropriate? Yes with Assist; Fluid restriction: 1200 mL Fluid   ED Cardiac monitoring   Apply Diabetes Mellitus Care Plan   STAT CBG when hypoglycemia is suspected. If treated, recheck every 15 minutes after each treatment until CBG >/= 70 mg/dl   Refer to Hypoglycemia Protocol Sidebar Report for treatment of CBG < 70 mg/dl   No HS correction Insulin    Maintain IV access   Vital signs   Notify physician (specify)   Mobility Protocol: No Restrictions   Refer to Sidebar Report Mobility Protocol for Adult Inpatient   Initiate Adult Central Line Maintenance and Catheter Clearance Protocol for patients with central line (CVC, PICC, Port, Hemodialysis, Trialysis)   Daily weights   Intake and Output   Initiate CHG Protocol for patients in ICU/SD or any patient with a central line or foley catheter   Do not place and if present remove PureWick   Initiate Oral Care Protocol   Initiate Carrier Fluid Protocol   RN may order General Admission PRN Orders utilizing General Admission PRN medications (through manage orders) for the following patient needs: allergy symptoms (Claritin), cold sores (Carmex), cough (Robitussin DM), eye irritation (Liquifilm Tears), hemorrhoids (Tucks), indigestion (Maalox), minor skin irritation (Hydrocortisone Cream), muscle pain (Ben Gay), nose irritation (saline nasal spray) and sore throat (Chloraseptic spray).   Ambulate with assistance   Swallow screen   Cardiac Monitoring Continuous x 48 hours Indications for use: Other; Other indications for use: nstemi NSTEMI   Do not attempt resuscitation (DNR)- Limited -Do  Not Intubate (DNI)   Inpatient consult to Cardiology   Consult to hospitalist   Pulse oximetry check with vital signs   Oxygen therapy Mode or (Route): Nasal cannula; Liters Per Minute: 2; Keep O2 saturation between: greater than 92 %   I-stat chem 8, ED   I-Stat Lactic Acid   ED EKG   EKG 12-Lead   Saline lock IV   Place in observation (patient's expected length of stay will be less than 2 midnights)   Aspiration precautions   Fall precautions    Author: Mario LULLA Blanch, MD 12 pm- 8 pm. [facility]. 03/14/2024 6:18 PM Please note for any communication after hours contact [facility] Assigned provider on call on Amion.

## 2024-03-15 DIAGNOSIS — R7989 Other specified abnormal findings of blood chemistry: Secondary | ICD-10-CM | POA: Diagnosis not present

## 2024-03-15 DIAGNOSIS — I5020 Unspecified systolic (congestive) heart failure: Secondary | ICD-10-CM | POA: Diagnosis not present

## 2024-03-15 DIAGNOSIS — I2510 Atherosclerotic heart disease of native coronary artery without angina pectoris: Secondary | ICD-10-CM | POA: Diagnosis not present

## 2024-03-15 DIAGNOSIS — M25512 Pain in left shoulder: Secondary | ICD-10-CM | POA: Diagnosis not present

## 2024-03-15 LAB — COMPREHENSIVE METABOLIC PANEL WITH GFR
ALT: 14 U/L (ref 0–44)
AST: 19 U/L (ref 15–41)
Albumin: 3.4 g/dL — ABNORMAL LOW (ref 3.5–5.0)
Alkaline Phosphatase: 56 U/L (ref 38–126)
Anion gap: 12 (ref 5–15)
BUN: 89 mg/dL — ABNORMAL HIGH (ref 8–23)
CO2: 29 mmol/L (ref 22–32)
Calcium: 9.3 mg/dL (ref 8.9–10.3)
Chloride: 97 mmol/L — ABNORMAL LOW (ref 98–111)
Creatinine, Ser: 3.77 mg/dL — ABNORMAL HIGH (ref 0.61–1.24)
GFR, Estimated: 14 mL/min — ABNORMAL LOW (ref >60)
Glucose, Bld: 117 mg/dL — ABNORMAL HIGH (ref 70–99)
Potassium: 4.1 mmol/L (ref 3.5–5.1)
Sodium: 138 mmol/L (ref 135–145)
Total Bilirubin: 0.9 mg/dL (ref 0.0–1.2)
Total Protein: 7.8 g/dL (ref 6.5–8.1)

## 2024-03-15 LAB — CBC
HCT: 36.8 % — ABNORMAL LOW (ref 39.0–52.0)
Hemoglobin: 12.3 g/dL — ABNORMAL LOW (ref 13.0–17.0)
MCH: 31.5 pg (ref 26.0–34.0)
MCHC: 33.4 g/dL (ref 30.0–36.0)
MCV: 94.4 fL (ref 80.0–100.0)
Platelets: 283 K/uL (ref 150–400)
RBC: 3.9 MIL/uL — ABNORMAL LOW (ref 4.22–5.81)
RDW: 14.6 % (ref 11.5–15.5)
WBC: 12.7 K/uL — ABNORMAL HIGH (ref 4.0–10.5)
nRBC: 0 % (ref 0.0–0.2)

## 2024-03-15 LAB — GLUCOSE, CAPILLARY
Glucose-Capillary: 96 mg/dL (ref 70–99)
Glucose-Capillary: 86 mg/dL (ref 70–99)
Glucose-Capillary: 112 mg/dL — ABNORMAL HIGH (ref 70–99)

## 2024-03-15 LAB — TROPONIN I (HIGH SENSITIVITY): Troponin I (High Sensitivity): 122 ng/L (ref <18)

## 2024-03-15 LAB — MAGNESIUM: Magnesium: 1.7 mg/dL (ref 1.7–2.4)

## 2024-03-15 MED ORDER — HYDROCODONE-ACETAMINOPHEN 5-325 MG PO TABS: 1.0000 | ORAL_TABLET | Freq: Four times a day (QID) | ORAL | 0 refills | Status: AC | PRN

## 2024-03-15 MED ORDER — SODIUM CHLORIDE 0.9 % IV SOLN: INTRAVENOUS | Status: DC

## 2024-03-15 MED ORDER — MAGNESIUM SULFATE 2 GM/50ML IV SOLN
2.0000 g | Freq: Once | INTRAVENOUS | Status: AC
  Administered 2024-03-15: 2 g via INTRAVENOUS
  Filled 2024-03-15: qty 50

## 2024-03-15 MED ORDER — LIDOCAINE 5 % EX PTCH: 1.0000 | MEDICATED_PATCH | CUTANEOUS | Status: AC

## 2024-03-15 MED ORDER — DICLOFENAC SODIUM 1 % EX GEL
2.0000 g | Freq: Four times a day (QID) | CUTANEOUS | Status: DC
  Filled 2024-03-15: qty 100

## 2024-03-15 MED ORDER — LIDOCAINE 5 % EX PTCH
1.0000 | MEDICATED_PATCH | CUTANEOUS | Status: DC
  Administered 2024-03-15: 1 via TRANSDERMAL
  Filled 2024-03-15: qty 1

## 2024-03-15 MED ORDER — DICLOFENAC SODIUM 1 % EX GEL: 2.0000 g | Freq: Four times a day (QID) | CUTANEOUS | Status: AC

## 2024-03-15 NOTE — Progress Notes (Addendum)
 Cardiology Admission History and Physical:    Patient ID: Kevin Schwartz MRN: [id number]; DOB: 25-Mar-1931   Admission date: 03/14/2024  Primary Care Provider: Sherlynn Madden, MD Primary Cardiologist: Toribio Fuel, MD    Chief Complaint:  Shortness of breath  Patient Profile:   Kevin Schwartz is a 89 y.o. male with hx of CHF (EF 45-50% in January of 2025), A-flutter on Eliquis , CAD S/P stent in 2022, CKD, DMII, HTN, HLD, RAS S/P stent of L renal artery in 2008,  who is being seen 03/14/2024 for the evaluation of chest pain at the request of Dr. Maude Galloway.   Interval history:   Mr. Pitsenbarger reports continued pain in his left shoulder that is mildly improved from yesterday. He denies chest pain. States that his shortness of breath has improved. He would like to know when he is able to go home.    Physical Exam/Data:   Vitals:   03/14/24 2040 03/14/24 2339 03/15/24 0432 03/15/24 0500  BP: 123/68 (!) 126/55 (!) 119/54   Pulse: 91 80 (!) 108   Resp: 18 16 18    Temp: 98.5 F (36.9 C) 98.4 F (36.9 C) (!) 97.4 F (36.3 C)   TempSrc: Oral Oral    SpO2: 100% 100% 100%   Weight:    89.1 kg  Height:        Intake/Output Summary (Last 24 hours) at 03/15/2024 0832 Last data filed at 03/15/2024 0300 Gross per 24 hour  Intake 302.93 ml  Output --  Net 302.93 ml      03/15/2024    5:00 AM 03/14/2024    2:28 PM 08/13/2023    2:08 PM  Last 3 Weights  Weight (lbs) 196 lb 6.9 oz 200 lb 9.9 oz 202 lb 2 oz  Weight (kg) 89.1 kg 91 kg 91.683 kg     Body mass index is 29.87 kg/m.    Physical Exam Constitutional:      General: He is not in acute distress.    Appearance: He is not ill-appearing.  Cardiovascular:     Rate and Rhythm: Normal rate. Rhythm irregular.     Pulses: Normal pulses.     Heart sounds: Murmur heard.     No gallop.  Pulmonary:     Effort: Pulmonary effort is normal.     Breath sounds: Normal breath sounds. No decreased breath  sounds, wheezing, rhonchi or rales.     Comments: On 2L [place] Musculoskeletal:     Right lower leg: No edema.     Left lower leg: No edema.     Comments: Tender to palpation right shoulder, Pain with right shoulder ROM   Neurological:     Mental Status: He is alert.     EKG:  The EKG was personally reviewed and demonstrates:  Atrial fibrillation. Left posterior fasicular block     Relevant CV Studies: Echo 1/30 1.Left ventricular ejection fraction, by estimation, is 45 to 50% . The left ventricle has mildly decreased function. The left ventricle demonstrates global hypokinesis. There is mild left ventricular hypertrophy. Left ventricular diastolic parameters are consistent with Grade I diastolic dysfunction ( impaired relaxation) . Elevated left ventricular end- diastolic pressure. The average left ventricular global longitudinal strain is - 9. 3 % . The global longitudinal strain is abnormal.  2. Right ventricular systolic function is normal. The right ventricular size is normal. There is normal pulmonary artery systolic pressure.  3. Left atrial size was severely dilated.  4. The mitral  valve is degenerative. Moderate mitral valve regurgitation. Mild mitral stenosis.  5. The aortic valve is normal in structure. There is moderate calcification of the aortic valve. There is moderate thickening of the aortic valve. Aortic valve regurgitation is mild to moderate. No aortic stenosis is present.  6. The inferior vena cava is normal in size with greater  Laboratory Data:  Chemistry Recent Labs  Lab 03/14/24 1445 03/14/24 1451 03/15/24 0427  NA 140 138 138  K 4.5 4.4 4.1  CL 93* 97* 97*  CO2 28  --  29  GLUCOSE 126* 134* 117*  BUN 86* 76* 89*  CREATININE 3.62* 3.70* 3.77*  CALCIUM  10.0  --  9.3  GFRNONAA 15*  --  14*  ANIONGAP 19*  --  12    Recent Labs  Lab 03/14/24 1445 03/15/24 0427  PROT 9.3* 7.8  ALBUMIN  4.2 3.4*  AST 26 19  ALT 17 14  ALKPHOS 75 56  BILITOT 0.9 0.9    Hematology Recent Labs  Lab 03/14/24 1445 03/14/24 1451 03/15/24 0427  WBC 12.0*  --  12.7*  RBC 4.61  --  3.90*  HGB 14.6 16.0 12.3*  HCT 44.5 47.0 36.8*  MCV 96.5  --  94.4  MCH 31.7  --  31.5  MCHC 32.8  --  33.4  RDW 14.5  --  14.6  PLT 320  --  283    Latest Reference Range & Units 03/14/24 20:29 03/14/24 22:15  Troponin I (High Sensitivity) <18 ng/L 122 (HH) (C) 133 (HH)  (HH): Data is critically high (C): Corrected BNP Recent Labs  Lab 03/14/24 1445  BNP 144.8*    DDimer No results for input(s): DDIMER in the last 168 hours.  Radiology/Studies:  DG Shoulder Left Portable Result Date: 03/14/2024 EXAM: 1 VIEW(S) XRAY OF THE LEFT SHOULDER 03/14/2024 06:17:00 PM COMPARISON: 4 / 19 / 22 . CLINICAL HISTORY: left shoulder pain FINDINGS: BONES AND JOINTS: Severe glenohumeral joint space narrowing with bone-on-bone contact consistent with severe glenohumeral osteoarthritis. Moderate to high-grade inferiorly directed and anteriorly directed osteophytosis inferior to the glenohumeral joint. Moderate acromioclavicular joint space narrowing and peripheral osteophytosis consistent with moderate acromioclavicular osteoarthritis. No acute fracture or dislocation. SOFT TISSUES: No abnormal calcifications. Visualized lung is unremarkable. CERVICAL SPINE: Marked degenerative disc space narrowing of the mid to lower cervical spine. IMPRESSION: 1. No acute fracture or dislocation. 2. Severe glenohumeral osteoarthritis. Electronically signed by: Norman Gatlin MD 03/14/2024 07:09 PM EST RP Workstation: [id number]   CT Chest Wo Contrast Result Date: 03/14/2024 CLINICAL DATA:  Respiratory illness. EXAM: CT CHEST WITHOUT CONTRAST TECHNIQUE: Multidetector CT imaging of the chest was performed following the standard protocol without IV contrast. RADIATION DOSE REDUCTION: This exam was performed according to the departmental dose-optimization program which includes automated exposure control,  adjustment of the mA and/or kV according to patient size and/or use of iterative reconstruction technique. COMPARISON:  Chest CT 04/16/2021 FINDINGS: Cardiovascular: Heart is mildly enlarged. Aorta is normal in size. There are atherosclerotic calcifications of the aorta and coronary arteries. There is no pericardial effusion. Mediastinum/Nodes: No enlarged mediastinal or axillary lymph nodes. Thyroid  gland, trachea, and esophagus demonstrate no significant findings. Lungs/Pleura: There is minimal atelectasis in the lung bases. The lungs are otherwise clear. There is no pleural effusion or pneumothorax. Upper Abdomen: No acute abnormality. Musculoskeletal: No acute osseous abnormality. IMPRESSION: 1. No acute cardiopulmonary process. 2. Mild cardiomegaly. 3. Aortic atherosclerosis. Aortic Atherosclerosis (ICD10-I70.0). Electronically Signed   By: Greig Maple HERO.D.  On: 03/14/2024 17:12   DG Chest Port 1 View Result Date: 03/14/2024 CLINICAL DATA:  Shortness of breath EXAM: PORTABLE CHEST 1 VIEW COMPARISON:  Chest x-ray 05/14/2022 FINDINGS: The heart size and mediastinal contours are within normal limits. Both lungs are clear. The visualized skeletal structures are unremarkable. IMPRESSION: No active disease. Electronically Signed   By: Greig Pique M.D.   On: 03/14/2024 15:32     Assessment and Plan:   Type II NSTEMI Elevated Troponin CAD Troponin elevated at 123-98-122-133 yesterday. Remains chest pain free today. Likely that he has poor clearance of troponin due to his CKD, as well as some troponin leak from his non-revascularized RCA. Demand ischemia is most likely in this patient. He can continue his home reduced dose Eliquis .   -EKG if he develops chest pain -Continue Eliquis  2.5mg  BID for anticoagulation -Atorvastatin  80mg  daily -Ezetimibe  10mg  daily   A-fib Known history of A-fib. Rates well controlled presently. On Eliquis  2.5mg  BID at home   -Continue home Eliquis    HFmrEF  (45-50%) Last echo from 05/14/2023 with impaired diastolic function and mildly reduced EF with mitral stenosis. No signs of volume overload on exam or x-ray. Lactic acidosis does not appear to be secondary to cardiogenic shock, and is downtrending. Crcl 13.8 today, roughly stable from yesterday.   -Hold home Jardiance  in the setting decreased creatinine clearance -Hold Torsemide  in the setting of decreased GFR   Left shoulder pain Appears mechanical rather than cardiogenic. Decreased ROM with increased pain with ROM, defer to primary team.   Per primary team AKI on CKD Leukocytosis Lactic acidosis Hypothyroidism DMII Left shoulder pain Gamma gap   For questions or updates, please contact [facility] Please consult [email or web address] for contact info under       Signed, Melvenia Morrison, MD  03/15/2024 8:32 AM    ATTENDING ATTESTATION:  After conducting a review of all available clinical information with the care team, interviewing the patient, and performing a physical exam, I agree with the findings and plan described in this note.  Patient doing well without chest pain.  Trops remain flat.  Still complains of left shoulder pain.  No further cardiac evaluation required.  Continue to hold Jardiance  and Torsemide  unitl kidney function improves back to baseline.  Will sign off, call with ?s.   GEN: No acute distress, AO x 3 HEENT:  MMM, no JVD, no scleral icterus Cardiac: RRR, 2/6 SEM Respiratory: Clear to auscultation bilaterally. GI: Soft, nontender, non-distended  MS: No edema; No deformity. Neuro:  Nonfocal      Lurena Red, MD Pager [phone]

## 2024-03-15 NOTE — NC FL2 (Signed)
 [place]  MEDICAID FL2 LEVEL OF CARE FORM     IDENTIFICATION  Patient Name: Kevin Schwartz Birthdate: [birth year] Sex: male Admission Date (Current Location): 03/14/2024  [place] and [place] Number:  [place] [id number] T Facility and Address:  [facility], [address] [place], [place] [address]      Provider Number: [id number]  Attending Physician Name and Address:  Sherrill Alejandro Donovan, DO  Relative Name and Phone Number:  MILTA GEORGETTE Blades   [phone]    Current Level of Care: Hospital Recommended Level of Care: Skilled Nursing Facility Prior Approval Number:    Date Approved/Denied:   PASRR Number: [id number]  Discharge Plan: SNF    Current Diagnoses: Patient Active Problem List   Diagnosis Date Noted   Left shoulder pain 03/14/2024   Hypoglycemia, unspecified 05/16/2022   Syncope 05/14/2022   Chronic diastolic heart failure (HCC) 08/26/2021   Acute on chronic combined systolic and diastolic CHF (congestive heart failure) (HCC) 07/11/2021   Acute respiratory failure with hypoxia (HCC) 07/11/2021   Leg wound, left 07/11/2021   Atrial flutter (HCC) 07/11/2021   Acute CHF (congestive heart failure) (HCC) 04/16/2021   PNA (pneumonia) 04/16/2021   COVID-19    Elevated troponin    NSTEMI (non-ST elevated myocardial infarction) (HCC) 07/31/2020   Grieving 02/16/2020   Bilateral inguinal hernia s/p lap hernia repair w mesh 10/30/2017 10/30/2017   Left femoral hernia s/p lap hernia repair w mesh 10/30/2017 10/30/2017   Loss of weight 01/21/2016   Heme positive stool 01/21/2016   Heart murmur 08/18/2013   Chronic kidney disease, stage III (moderate) (HCC) 07/14/2013   Hyperlipidemia with target low density lipoprotein (LDL) cholesterol less than 100 mg/dL 95/97/7984   Essential hypertension, benign 07/14/2013   Type II or unspecified type diabetes mellitus with renal manifestations, not stated as uncontrolled(250.40) 07/14/2013    Insomnia 07/14/2013   Angioedema of lips 12/26/2012   MGUS (monoclonal gammopathy of unknown significance) 08/01/2011   Gout of right wrist 08/01/2011   S/P coronary artery stent placement 08/01/2011   DM type 2 causing CKD stage 2 (HCC) 08/01/2011   Bradycardia 10/15/2010   Mixed hyperlipidemia due to type 2 diabetes mellitus (HCC) 05/01/2010   Essential hypertension 05/01/2010   CAD, NATIVE VESSEL 05/01/2010   PVD 05/01/2010    Orientation RESPIRATION BLADDER Height & Weight     Self, Time, Situation, Place  O2 Incontinent, External catheter Weight: 196 lb 6.9 oz (89.1 kg) Height:  5' 8 (172.7 cm)  BEHAVIORAL SYMPTOMS/MOOD NEUROLOGICAL BOWEL NUTRITION STATUS        Diet (see discharge summary)  AMBULATORY STATUS COMMUNICATION OF NEEDS Skin   Total Care Verbally                         Personal Care Assistance Level of Assistance  Bathing, Feeding, Dressing Bathing Assistance: Limited assistance Feeding assistance: Limited assistance Dressing Assistance: Limited assistance     Functional Limitations Info  Sight, Hearing, Speech Sight Info: Adequate Hearing Info: Adequate Speech Info: Adequate    SPECIAL CARE FACTORS FREQUENCY  PT (By licensed PT), OT (By licensed OT)     PT Frequency: 5x week OT Frequency: 5x week            Contractures Contractures Info: Not present    Additional Factors Info  Code Status, Allergies Code Status Info: DNR Allergies Info: Cozaar  (Losartan  Potassium), Crestor  (Rosuvastatin ), Zestril  (Lisinopril ), Tape  Current Medications (03/15/2024):  This is the current hospital active medication list Current Facility-Administered Medications  Medication Dose Route Frequency Provider Last Rate Last Admin   acetaminophen  (TYLENOL ) tablet 650 mg  650 mg Oral Q6H PRN Patel, Ekta V, MD   650 mg at 03/14/24 2335   Or   acetaminophen  (TYLENOL ) suppository 650 mg  650 mg Rectal Q6H PRN Patel, Ekta V, MD       allopurinol   (ZYLOPRIM ) tablet 100 mg  100 mg Oral Daily Patel, Ekta V, MD   100 mg at 03/15/24 0845   apixaban  (ELIQUIS ) tablet 2.5 mg  2.5 mg Oral BID Patel, Ekta V, MD   2.5 mg at 03/15/24 0846   atorvastatin  (LIPITOR ) tablet 80 mg  80 mg Oral Daily Patel, Ekta V, MD   80 mg at 03/15/24 0845   ezetimibe  (ZETIA ) tablet 10 mg  10 mg Oral Daily Patel, Ekta V, MD   10 mg at 03/15/24 0846   hydrALAZINE  (APRESOLINE ) injection 5 mg  5 mg Intravenous Q4H PRN Patel, Ekta V, MD       HYDROcodone -acetaminophen  (NORCO/VICODIN) 5-325 MG per tablet 1 tablet  1 tablet Oral Q4H PRN Patel, Ekta V, MD   1 tablet at 03/15/24 0557   insulin  aspart (novoLOG ) injection 0-15 Units  0-15 Units Subcutaneous TID WC Tobie Mario GAILS, MD       levothyroxine  (SYNTHROID ) tablet 25 mcg  25 mcg Oral QAC breakfast Tobie Mario V, MD   25 mcg at 03/15/24 0846   morphine (PF) 2 MG/ML injection 2 mg  2 mg Intravenous Q4H PRN Tobie Mario GAILS, MD       pantoprazole  (PROTONIX ) injection 40 mg  40 mg Intravenous Q12H Patel, Ekta V, MD   40 mg at 03/15/24 0846   sodium chloride  flush (NS) 0.9 % injection 3 mL  3 mL Intravenous Q12H Tobie Mario V, MD   3 mL at 03/15/24 0850   tamsulosin  (FLOMAX ) capsule 0.4 mg  0.4 mg Oral Daily Patel, Ekta V, MD   0.4 mg at 03/15/24 0845     Discharge Medications: Please see discharge summary for a list of discharge medications.  Relevant Imaging Results:  Relevant Lab Results:   Additional Information SSN: [id number]  Bridget Cordella Simmonds, LCSW

## 2024-03-15 NOTE — Care Management Obs Status (Signed)
 MEDICARE OBSERVATION STATUS NOTIFICATION   Patient Details  Name: Kevin Schwartz MRN: [id number] Date of Birth: [birth year]   Medicare Observation Status Notification Given:  Yes    Jennie Laneta Dragon 03/15/2024, 1:46 PM

## 2024-03-15 NOTE — Evaluation (Signed)
 Occupational Therapy Evaluation Patient Details Name: Kevin Schwartz MRN: [id number] DOB: 1930-07-13 Today's Date: 03/15/2024   History of Present Illness   89 yo male presents to [facility] hospital on 12/1 from home with SOB and L shoulder pain. PMH non Stemi, PVD CKD, DM2 HTN HLD afib on eliquis .     Clinical Impressions Patient admitted for the above diagnosis.  Primary deficit is L shoulder pain, limiting functional use.  Decreased AROM to L shoulder is chronic, just the pain is new.  Patient able to self feed and perform light grooming with setup due to the L shoulder pain.  At baseline he requires assist for ADL completion from SNF staff.  PT is focusing on transfers, will defer to them for OOB.  OT in the acute setting will defer to OT at the SNF for screen and determination of needs based on his prior care plan.  No significant change to baseline, no acute OT indicated.  Recommend return to SNF once cleared by MD.       If plan is discharge home, recommend the following:   Two people to help with walking and/or transfers;A lot of help with bathing/dressing/bathroom     Functional Status Assessment   Patient has not had a recent decline in their functional status     Equipment Recommendations   None recommended by OT     Recommendations for Other Services         Precautions/Restrictions   Precautions Precautions: Fall Recall of Precautions/Restrictions: Impaired Restrictions Weight Bearing Restrictions Per Provider Order: No     Mobility Bed Mobility               General bed mobility comments: up in recliner    Transfers Overall transfer level: Needs assistance Equipment used: Ambulation equipment used, 2 person hand held assist Transfers: Sit to/from Stand, Bed to chair/wheelchair/BSC Sit to Stand: Max assist, +2 physical assistance          Lateral/Scoot Transfers: Max assist General transfer comment: Per PT eval - patient wanting to  stay in recliner, unable to stand, Max A to scoot back in the recliner.      Balance Overall balance assessment: Needs assistance Sitting-balance support: No upper extremity supported, Feet supported Sitting balance-[name]: Fair                                     ADL either performed or assessed with clinical judgement   ADL Overall ADL's : At baseline                                       General ADL Comments: Assist as needed from facility staff.  Needing setup for self feeding and grooming due to shoulder pain.     Vision Baseline Vision/History: 1 Wears glasses Patient Visual Report: No change from baseline       Perception Perception: Not tested       Praxis Praxis: Not tested       Pertinent Vitals/Pain Pain Assessment Pain Assessment: Faces Faces Pain Scale: Hurts even more Pain Location: L shoulder Pain Descriptors / Indicators: Sharp, Radiating Pain Intervention(s): Monitored during session     Extremity/Trunk Assessment Upper Extremity Assessment Upper Extremity Assessment: Right hand dominant LUE Deficits / Details: pt with active shoulder flexion to ~90 degrees,  firm end feel with passive motion at 100 degrees and pt reports pain. Elbow PROM WFL, flexion 3/5, wrist/digits ROM WFL LUE Sensation: decreased light touch   Lower Extremity Assessment Lower Extremity Assessment: Defer to PT evaluation   Cervical / Trunk Assessment Cervical / Trunk Assessment: Kyphotic   Communication Communication Communication: No apparent difficulties Factors Affecting Communication: Hearing impaired   Cognition Arousal: Alert Behavior During Therapy: WFL for tasks assessed/performed Cognition: No family/caregiver present to determine baseline                               Following commands: Intact       Cueing  General Comments   Cueing Techniques: Verbal cues  VSS on RA   Exercises     Shoulder  Instructions      Home Living Family/patient expects to be discharged to:: Skilled nursing facility                                 Additional Comments: pt resides at a nursing home for 2+ years      Prior Functioning/Environment Prior Level of Function : Needs assist             Mobility Comments: pt reports PRN assistance for lateral scoot transfers, has been working on standing in parallel bars. Pt reports independence with wheelchair mobility ADLs Comments: Requires assist with bathing and dressing via staff at SNF.  Minimal setup for self feeding.    OT Problem List: Decreased strength;Decreased range of motion;Impaired balance (sitting and/or standing);Pain   OT Treatment/Interventions:        OT Goals(Current goals can be found in the care plan section)   Acute Rehab OT Goals Patient Stated Goal: Return to SNF OT Goal Formulation: With patient Time For Goal Achievement: 03/18/24 Potential to Achieve Goals: Good   OT Frequency:       Co-evaluation              AM-PAC OT 6 Clicks Daily Activity     Outcome Measure Help from another person eating meals?: A Little Help from another person taking care of personal grooming?: A Little Help from another person toileting, which includes using toliet, bedpan, or urinal?: A Lot Help from another person bathing (including washing, rinsing, drying)?: A Lot Help from another person to put on and taking off regular upper body clothing?: A Lot Help from another person to put on and taking off regular lower body clothing?: A Lot 6 Click Score: 14   End of Session Nurse Communication: Mobility status  Activity Tolerance: Patient tolerated treatment well Patient left: in chair;with call bell/phone within reach;with chair alarm set  OT Visit Diagnosis: Pain;Muscle weakness (generalized) (M62.81) Pain - Right/Left: Left Pain - part of body: Shoulder                Time: 8894-8877 OT Time Calculation  (min): 17 min Charges:  OT General Charges $OT Visit: 1 Visit OT Evaluation $OT Eval Moderate Complexity: 1 Mod  03/15/2024  RP, OTR/L  Acute Rehabilitation Services  Office:  [phone]   Charlie JONETTA Halsted 03/15/2024, 11:27 AM

## 2024-03-15 NOTE — TOC Transition Note (Addendum)
 Transition of Care [facility]) - Discharge Note   Patient Details  Name: Kevin Schwartz MRN: [id number] Date of Birth: [birth year]  Transition of Care [facility] CM/SW Contact:  Bridget Cordella Simmonds, LCSW Phone Number: 03/15/2024, 4:16 PM   Clinical Narrative:   Pt discharging to [place], room 806a.  RN call report to [phone].   PTAR called [phone].    Final next level of care: Skilled Nursing Facility Barriers to Discharge: Barriers Resolved   Patient Goals and CMS Choice Patient states their goals for this hospitalization and ongoing recovery are:: get back to normal   Choice offered to / list presented to : Patient (pt does want to return to [facility])      Discharge Placement              Patient chooses bed at: [facility] Patient to be transferred to facility by: ptar Name of family member notified: son Georgette Patient and family notified of of transfer: 03/15/24  Discharge Plan and Services Additional resources added to the After Visit Summary for   In-house Referral: Clinical Social Work   Post Acute Care Choice: Skilled Nursing Facility                               Social Drivers of Health (SDOH) Interventions SDOH Screenings   Food Insecurity: No Food Insecurity (03/14/2024)  Housing: Low Risk  (03/14/2024)  Transportation Needs: No Transportation Needs (03/14/2024)  Utilities: Not At Risk (03/14/2024)  Alcohol Screen: Low Risk  (01/25/2018)  Depression (PHQ2-9): Low Risk  (09/29/2022)  Financial Resource Strain: Low Risk  (05/25/2017)  Physical Activity: Insufficiently Active (05/25/2017)  Social Connections: Moderately Isolated (03/14/2024)  Stress: Stress Concern Present (05/25/2017)  Tobacco Use: Medium Risk (03/14/2024)     Readmission Risk Interventions     No data to display

## 2024-03-15 NOTE — Discharge Summary (Addendum)
 Physician Discharge Summary   Patient: Kevin Schwartz MRN: [id number] DOB: May 26, 1930  Admit date:     03/14/2024  Discharge date: 03/15/24  Discharge Physician: Alejandro Marker, DO   PCP: Sherlynn Madden, MD   Recommendations at discharge:   Follow-up with PCP within 1 to 2 weeks repeat CBC, CMP, mag, Phos within 1 week Follow-up with cardiology in outpatient setting within 1 to 2 weeks Follow-up with nephrology in outpatient setting and continue close monitoring of renal function Follow-up with orthopedic surgery in outpatient setting for pain in the left shoulder.  Discharge Diagnoses: Principal Problem:   Left shoulder pain  Resolved Problems:   * No resolved hospital problems. *  Hospital Course: HPI per Dr. Mario Blanch  Kevin Schwartz is a 89 y.o. male with past medical history  of  non-STEMI, peripheral artery disease, CKD, type 2 diabetes, hypertension hyperlipidemia and A-fib on Eliquis  , Patient brought by EMS from home for complaints of shortness of breath and left shoulder pain since yesterday evening.  He does wear 2 L nasal cannula at bedtime only on initial EMS evaluation patient was O2 sats of 96% on room air and 99 on oxygen patient is alert awake oriented afebrile on initial presentation.     PTA meds include allopurinol  100 mg, apixaban  2.5 twice daily, Lipitor  80, Jardiance  10, Synthroid  25, Protonix  40 mg, Flomax , med reconciliation pending.   **Interim History Patient was evaluated by cardiology for his elevated troponins and they felt no further cardiac workup was needed.  X-ray showed severe glenohumeral osteoarthritis and patient was placed on analgesics with improvement in his shoulder pain.  Will need follow-up with PCP and orthopedic surgery outpatient setting and consider steroid injection versus further outpatient workup.  His renal function remained a little elevated but he was initiated on low-dose IV fluids.  His diuretics will be held  and will need to have his renal function repeated within 1 week and outpatient nephrology follow-up.  He is medically stable for discharge at this time as he has been cleared by cardiology.  He will need to follow-up with cardiology, PCP, nephrology and orthopedic surgery outpatient setting.  Assessment and Plan:  Left sided shoulder: With patient's history of CAD and currently elevated troponin the Admitter was concerned that patient's shoulder pain is anginal variant symptom. Troponin trend as below  Will continue patient on atorvastatin  80 mg po Daily. Cardiology consulted and no further cardiac evaluation is required and he is doing well without chest pain and troponins remain flat.  They are recommending holding his Jardiance  and torsemide  until kidney function improves back to baseline.  Given his shoulder pain x-ray done and showed No acute fracture or dislocation but did show Severe glenohumeral osteoarthritis.  Added diclofenac  gel 2 g topically 4 times daily as well as lidocaine  patch 1 patch TD and analgesics with hydromorphone -acetaminophen  1 tab p.o. every 4 as needed for moderate pain.  Will need outpatient evaluation with orthopedic surgery.  PT OT recommending SNF   Chronic Respiratory Failure: On 2L at bedtime. SpO2: 100 % O2 Flow Rate (L/min): 2 L/min No Findings on CT Chest. Respiratory Virus Panel Negative.      Elevated Troponin /Type 2 NSTEMI:  Troponin went from 123 -> 98 -> 122 -> 133. Remains CP free. Elevated in the setting of poor clearance from CKD and Demand ischemia is most likely in this patient and Attributable to his mild worsening of AKI and demand.  Cardiology was consulted and recommending  continuing Eliquis  2.5 mg p.o.  twice daily for anticoagulation, atorvastatin  80 mg p.o. daily and ezetimibe  10 mg daily.  Follow-up with cardiology in outpatient setting.   Elevated Anion Gap 2/2 to Lactic Acidosis, suspect more from hypovolemia and dehydration. We will hold his bp  meds and monitor. Continued with low rate IVF at 20 cc x 1 days. Also is hemoconcentrated. Hold Diuretics as Below. Normal LFT, and no albuterol . LA improved and so did AG. LA went from 3.2 -> 2.4.  Follow in Clinic    Chronic combined systolic and diastolic congestive heart failure (EF of 45-50%): Mild Cardiomegly noted on CT w/o Contrast. BNP Cards consulted. Continue to hold patient's Jardiance , and any diuretic therapy. Strict I/O and sodium restricted diet.  Continue with daily weights and will need outpatient cardiology follow-up   AKI on CKD stage V: Baseline Cr is BUN/Cr Trend: Recent Labs  Lab 03/14/24 1445 03/14/24 1451 03/15/24 0427  BUN 86* 76* 89*  CREATININE 3.62* 3.70* 3.77*  -Hold Metolazone , Torsemide , and Jardiance   -Has MGUS -Avoid Nephrotoxic Medications, Contrast Dyes, Hypotension and Dehydration to Ensure Adequate Renal Perfusion and will need to Renally Adjust Med. Continue to Monitor and Trend Renal Function carefully and repeat CMP within 1 week - Follow-up with nephrology in outpatient setting   Hypothyroidism: Continue Levothyroxine  at 25 mcg.   Chronic Atrial Fibrillation: Continue Telemetry Monitoring and C/w Anticoagulation with Elqiuis   Diabetes Mellitus Type 2: Glycemic protocol. Swallow eval  and carb consistent diet. C/w Moderate Novolog  SSI AC. Hold Jardiance . CTM CBGs per Protocol. CBG Trend:  Recent Labs  Lab 03/14/24 1826 03/14/24 2208 03/15/24 0616 03/15/24 1121  GLUCAP 127* 187* 96 86    Normocytic Anemia: Hgb/Hct Trend:  Recent Labs  Lab 03/14/24 1445 03/14/24 1451 03/15/24 0427  HGB 14.6 16.0 12.3*  HCT 44.5 47.0 36.8*  MCV 96.5  --  94.4  -Check Anemia Panel in the outpatient setting. CTM for S/Sx of Bleeding; No overt bleeding noted. Repeat CBC in the AM    Hypoalbuminemia: Patient's Albumin  Lvl went from 4.2 -> 3.4. CTM & Trend & repeat CMP in the AM   Overweight: Complicates overall prognosis and care. Estimated body mass index  is 29.87 kg/m as calculated from the following:   Height as of this encounter: 5' 8 (1.727 m).   Weight as of this encounter: 89.1 kg. Weight Loss and Dietary Counseling given  Consultants: Cardiology Procedures performed: As delineated as above   Disposition: Skilled nursing facility Diet recommendation:  Discharge Diet Orders (From admission, onward)     Start     Ordered   03/15/24 0000  Diet - low sodium heart healthy        03/15/24 1537   03/15/24 0000  Diet Carb Modified        03/15/24 1537           Cardiac and Carb modified diet DISCHARGE MEDICATION: Allergies as of 03/15/2024       Reactions   Cozaar  [losartan  Potassium] Swelling, Other (See Comments)   Angioedema   Crestor  [rosuvastatin ] Swelling, Other (See Comments)   Angioedema   Zestril  [lisinopril ] Swelling, Other (See Comments)   Angioedema   Tape Other (See Comments)   Sensitive skin, tears easily.        Medication List     PAUSE taking these medications    empagliflozin  10 MG Tabs tablet Wait to take this until your doctor or other care provider tells you to start  again. Commonly known as: JARDIANCE  Take 10 mg by mouth every other day.   metolazone  5 MG tablet Wait to take this until your doctor or other care provider tells you to start again. Commonly known as: ZAROXOLYN  Take 5 mg by mouth See admin instructions. Take 5mg  on Tue and Sat only. Give 30 minutes prior to torsemide  dose.   torsemide  100 MG tablet Wait to take this until your doctor or other care provider tells you to start again. Commonly known as: DEMADEX  Take 100 mg by mouth daily.       TAKE these medications    acetaminophen  500 MG tablet Commonly known as: TYLENOL  Take 1,000 mg by mouth 3 (three) times daily as needed for mild pain (pain score 1-3) (knee pain).   allopurinol  100 MG tablet Commonly known as: ZYLOPRIM  Take 1 tablet (100 mg total) by mouth daily.   atorvastatin  80 MG tablet Commonly known  as: LIPITOR  Take 80 mg by mouth at bedtime.   Biofreeze 4 % Gel Generic drug: Menthol (Topical Analgesic) Apply 1 application  topically 3 (three) times daily as needed (pain). To right wrist/hand   Eucerin Itch Relief 0.1 % Lotn Generic drug: Menthol (Topical Analgesic) Apply 1 application  topically daily. Apply to chest, abdomen, back, neck, shoulder, rash   cetirizine 10 MG tablet Commonly known as: ZYRTEC Take 10 mg by mouth daily.   COUGH DROPS MT Use as directed 1 tablet in the mouth or throat 3 (three) times daily as needed (cough).   diclofenac  Sodium 1 % Gel Commonly known as: VOLTAREN  Apply 2 g topically 4 (four) times daily. What changed:  when to take this reasons to take this   Eliquis  2.5 MG Tabs tablet Generic drug: apixaban  Take 2.5 mg by mouth 2 (two) times daily.   eucerin cream Apply 1 Application topically daily as needed for dry skin.   ezetimibe  10 MG tablet Commonly known as: ZETIA  Take 10 mg by mouth daily.   ferrous sulfate 325 (65 FE) MG EC tablet Take 325 mg by mouth daily.   HYDROcodone -acetaminophen  5-325 MG tablet Commonly known as: NORCO/VICODIN Take 1 tablet by mouth every 6 (six) hours as needed for moderate pain (pain score 4-6).   levothyroxine  25 MCG tablet Commonly known as: SYNTHROID  Take 25 mcg by mouth daily before breakfast.   lidocaine  5 % Commonly known as: LIDODERM  Place 1 patch onto the skin daily. Remove & Discard patch within 12 hours or as directed by MD   methocarbamol 500 MG tablet Commonly known as: ROBAXIN Take 500 mg by mouth 2 (two) times daily as needed for muscle spasms.   multivitamin tablet Take 1 tablet by mouth daily.   pantoprazole  40 MG tablet Commonly known as: PROTONIX  Take 1 tablet (40 mg total) by mouth daily.   Potassium Chloride  ER 20 MEQ Tbcr Take 40 mEq by mouth daily.   pregabalin 25 MG capsule Commonly known as: LYRICA Take 25 mg by mouth at bedtime.   sodium chloride  0.65 %  Soln nasal spray Commonly known as: OCEAN Place 1 spray into both nostrils every 4 (four) hours as needed (for epistaxis).   tamsulosin  0.4 MG Caps capsule Commonly known as: FLOMAX  Take 0.4 mg by mouth daily.   VASELINE EX Apply 1 application  topically at bedtime. To bilateral nares   Zinc Oxide 25 % Pste Apply 1 application  topically 2 (two) times daily. To bottom during incontinence care        Contact  information for after-discharge care     Destination     [facility], [place] .   Service: Skilled Nursing Contact information: [address] [facility] [place]  [address] [phone]                    Discharge Exam: Filed Weights   03/14/24 1428 03/15/24 0500  Weight: 91 kg 89.1 kg   Vitals:   03/15/24 0833 03/15/24 1500  BP: (!) 110/58 123/60  Pulse:  75  Resp: 17 18  Temp: 97.8 F (36.6 C) 98.5 F (36.9 C)  SpO2: 99% 100%   Examination: Physical Exam:  Constitutional: WN/WD overweight elderly African-American male in no acute distress Respiratory: Diminished to auscultation bilaterally, no wheezing, rales, rhonchi or crackles. Normal respiratory effort and patient is not tachypenic. No accessory muscle use.  Unlabored breathing.  Wearing supplemental oxygen nasal cannula Cardiovascular: RRR, no murmurs / rubs / gallops. S1 and S2 auscultated.  Trace extremity edema Abdomen: Soft, non-tender, distended secondary body habitus. Bowel sounds positive.  GU: Deferred. Musculoskeletal: No clubbing / cyanosis of digits/nails. No joint deformity upper and lower extremities.  Decreased range of motion noted in his left shoulder due to pain Skin: No rashes, lesions, ulcers limited skin evaluation. No induration; Warm and dry.  Neurologic: CN 2-12 grossly intact with no focal deficits. Romberg sign and cerebellar reflexes not assessed.  Psychiatric: Normal judgment and insight. Alert and oriented x 3. Normal mood and appropriate  affect.   Condition at discharge: stable  The results of significant diagnostics from this hospitalization (including imaging, microbiology, ancillary and laboratory) are listed below for reference.   Imaging Studies: DG Shoulder Left Portable Result Date: 03/14/2024 EXAM: 1 VIEW(S) XRAY OF THE LEFT SHOULDER 03/14/2024 06:17:00 PM COMPARISON: 4 / 19 / 22 . CLINICAL HISTORY: left shoulder pain FINDINGS: BONES AND JOINTS: Severe glenohumeral joint space narrowing with bone-on-bone contact consistent with severe glenohumeral osteoarthritis. Moderate to high-grade inferiorly directed and anteriorly directed osteophytosis inferior to the glenohumeral joint. Moderate acromioclavicular joint space narrowing and peripheral osteophytosis consistent with moderate acromioclavicular osteoarthritis. No acute fracture or dislocation. SOFT TISSUES: No abnormal calcifications. Visualized lung is unremarkable. CERVICAL SPINE: Marked degenerative disc space narrowing of the mid to lower cervical spine. IMPRESSION: 1. No acute fracture or dislocation. 2. Severe glenohumeral osteoarthritis. Electronically signed by: Norman Gatlin MD 03/14/2024 07:09 PM EST RP Workstation: [id number]   CT Chest Wo Contrast Result Date: 03/14/2024 CLINICAL DATA:  Respiratory illness. EXAM: CT CHEST WITHOUT CONTRAST TECHNIQUE: Multidetector CT imaging of the chest was performed following the standard protocol without IV contrast. RADIATION DOSE REDUCTION: This exam was performed according to the departmental dose-optimization program which includes automated exposure control, adjustment of the mA and/or kV according to patient size and/or use of iterative reconstruction technique. COMPARISON:  Chest CT 04/16/2021 FINDINGS: Cardiovascular: Heart is mildly enlarged. Aorta is normal in size. There are atherosclerotic calcifications of the aorta and coronary arteries. There is no pericardial effusion. Mediastinum/Nodes: No enlarged mediastinal or  axillary lymph nodes. Thyroid  gland, trachea, and esophagus demonstrate no significant findings. Lungs/Pleura: There is minimal atelectasis in the lung bases. The lungs are otherwise clear. There is no pleural effusion or pneumothorax. Upper Abdomen: No acute abnormality. Musculoskeletal: No acute osseous abnormality. IMPRESSION: 1. No acute cardiopulmonary process. 2. Mild cardiomegaly. 3. Aortic atherosclerosis. Aortic Atherosclerosis (ICD10-I70.0). Electronically Signed   By: Greig Pique M.D.   On: 03/14/2024 17:12   DG Chest Port 1 View Result Date: 03/14/2024  CLINICAL DATA:  Shortness of breath EXAM: PORTABLE CHEST 1 VIEW COMPARISON:  Chest x-ray 05/14/2022 FINDINGS: The heart size and mediastinal contours are within normal limits. Both lungs are clear. The visualized skeletal structures are unremarkable. IMPRESSION: No active disease. Electronically Signed   By: Greig Pique M.D.   On: 03/14/2024 15:32   Microbiology: Results for orders placed or performed during the hospital encounter of 03/14/24  Resp panel by RT-PCR (RSV, Flu A&B, Covid) Anterior Nasal Swab     Status: None   Collection Time: 03/14/24  2:22 PM   Specimen: Anterior Nasal Swab  Result Value Ref Range Status   SARS Coronavirus 2 by RT PCR NEGATIVE NEGATIVE Final   Influenza A by PCR NEGATIVE NEGATIVE Final   Influenza B by PCR NEGATIVE NEGATIVE Final    Comment: (NOTE) The Xpert Xpress SARS-CoV-2/FLU/RSV plus assay is intended as an aid in the diagnosis of influenza from Nasopharyngeal swab specimens and should not be used as a sole basis for treatment. Nasal washings and aspirates are unacceptable for Xpert Xpress SARS-CoV-2/FLU/RSV testing.  Fact Sheet for Patients: bloggercourse.com  Fact Sheet for Healthcare Providers: seriousbroker.it  This test is not yet approved or cleared by the [place]  FDA and has been authorized for detection and/or diagnosis of  SARS-CoV-2 by FDA under an Emergency Use Authorization (EUA). This EUA will remain in effect (meaning this test can be used) for the duration of the COVID-19 declaration under Section 564(b)(1) of the Act, 21 U.S.C. section 360bbb-3(b)(1), unless the authorization is terminated or revoked.     Resp Syncytial Virus by PCR NEGATIVE NEGATIVE Final    Comment: (NOTE) Fact Sheet for Patients: bloggercourse.com  Fact Sheet for Healthcare Providers: seriousbroker.it  This test is not yet approved or cleared by the [place]  FDA and has been authorized for detection and/or diagnosis of SARS-CoV-2 by FDA under an Emergency Use Authorization (EUA). This EUA will remain in effect (meaning this test can be used) for the duration of the COVID-19 declaration under Section 564(b)(1) of the Act, 21 U.S.C. section 360bbb-3(b)(1), unless the authorization is terminated or revoked.  Performed at [facility], [address] [place], [place] [address]   Culture, blood (routine x 2)     Status: None (Preliminary result)   Collection Time: 03/14/24  3:23 PM   Specimen: BLOOD RIGHT FOREARM  Result Value Ref Range Status   Specimen Description BLOOD RIGHT FOREARM  Final   Special Requests   Final    BOTTLES DRAWN AEROBIC AND ANAEROBIC Blood Culture results may not be optimal due to an inadequate volume of blood received in culture bottles   Culture   Final    NO GROWTH < 24 HOURS Performed at [facility], [address] [place], [place] [address]    Report Status PENDING  Incomplete  Culture, blood (routine x 2)     Status: None (Preliminary result)   Collection Time: 03/14/24  8:29 PM   Specimen: BLOOD  Result Value Ref Range Status   Specimen Description BLOOD SITE NOT SPECIFIED  Final   Special Requests   Final    BOTTLES DRAWN AEROBIC AND ANAEROBIC Blood Culture results may not be optimal due to an inadequate volume of blood  received in culture bottles   Culture   Final    NO GROWTH < 12 HOURS Performed at [facility], [address], [place], [place] [address]    Report Status PENDING  Incomplete  Labs: CBC: Recent Labs  Lab 03/14/24 1445 03/14/24 1451 03/15/24 0427  WBC 12.0*  --  12.7*  NEUTROABS 10.1*  --   --   HGB 14.6 16.0 12.3*  HCT 44.5 47.0 36.8*  MCV 96.5  --  94.4  PLT 320  --  283   Basic Metabolic Panel: Recent Labs  Lab 03/14/24 1445 03/14/24 1451 03/14/24 2029 03/15/24 0427  NA 140 138  --  138  K 4.5 4.4  --  4.1  CL 93* 97*  --  97*  CO2 28  --   --  29  GLUCOSE 126* 134*  --  117*  BUN 86* 76*  --  89*  CREATININE 3.62* 3.70*  --  3.77*  CALCIUM  10.0  --   --  9.3  MG  --   --  2.0 1.7   Liver Function Tests: Recent Labs  Lab 03/14/24 1445 03/15/24 0427  AST 26 19  ALT 17 14  ALKPHOS 75 56  BILITOT 0.9 0.9  PROT 9.3* 7.8  ALBUMIN  4.2 3.4*   CBG: Recent Labs  Lab 03/14/24 1826 03/14/24 2208 03/15/24 0616 03/15/24 1121  GLUCAP 127* 187* 96 86   Discharge time spent: greater than 30 minutes.  Signed: Alejandro Marker, DO [facility] 03/15/2024

## 2024-03-15 NOTE — Progress Notes (Signed)
 RN called [facility] and gave report to [place], CHARITY FUNDRAISER and she stated understanding. IV has been removed and patient is asleep in room waiting for PTAR. AVS in chart and DNR signed as well and scripts

## 2024-03-15 NOTE — TOC Initial Note (Addendum)
 Transition of Care [facility]) - Initial/Assessment Note    Patient Details  Name: Kevin Schwartz MRN: [id number] Date of Birth: [birth year]  Transition of Care [facility]) CM/SW Contact:    Bridget Cordella Simmonds, LCSW Phone Number: 03/15/2024, 3:34 PM  Clinical Narrative:   CSW met with pt for initial assessment. Pt from [facility], LTC.  Discussed PT recommendation for PT and pt agreeable, confirms he does plan to return to [place].  Permission given to speak with son Georgette.  CSW confirmed with [place]: pt can return, does not need approved auth to return today.  MD informed.                SNF auth request submitted in [place] and approved: [id number], 3 days: 12/2-12/4.  Son Georgette updated by phone.   Expected Discharge Plan: Skilled Nursing Facility Barriers to Discharge: No Barriers Identified   Patient Goals and CMS Choice Patient states their goals for this hospitalization and ongoing recovery are:: get back to normal   Choice offered to / list presented to : Patient (pt does want to return to [facility])      Expected Discharge Plan and Services In-house Referral: Clinical Social Work   Post Acute Care Choice: Skilled Nursing Facility Living arrangements for the past 2 months: Skilled Nursing Facility                                      Prior Living Arrangements/Services Living arrangements for the past 2 months: Skilled Nursing Facility Lives with:: Facility Resident Patient language and need for interpreter reviewed:: Yes Do you feel safe going back to the place where you live?: Yes      Need for Family Participation in Patient Care: Yes (Comment) Care giver support system in place?: Yes (comment) Current home services: Other (comment) (na) Criminal Activity/Legal Involvement Pertinent to Current Situation/Hospitalization: No - Comment as needed  Activities of Daily Living   ADL Screening (condition at time of admission) Independently performs ADLs?: No Does the  patient have a NEW difficulty with bathing/dressing/toileting/self-feeding that is expected to last >3 days?: Yes (Initiates electronic notice to provider for possible OT consult) Does the patient have a NEW difficulty with getting in/out of bed, walking, or climbing stairs that is expected to last >3 days?: Yes (Initiates electronic notice to provider for possible PT consult) Does the patient have a NEW difficulty with communication that is expected to last >3 days?: No Is the patient deaf or have difficulty hearing?: Yes Does the patient have difficulty seeing, even when wearing glasses/contacts?: Yes Does the patient have difficulty concentrating, remembering, or making decisions?: No  Permission Sought/Granted Permission sought to share information with : Family Supports Permission granted to share information with : Yes, Verbal Permission Granted  Share Information with NAME: son Georgette  Permission granted to share info w AGENCY: SNF        Emotional Assessment Appearance:: Appears stated age Attitude/Demeanor/Rapport: Engaged Affect (typically observed): Appropriate, Pleasant Orientation: : Oriented to Self, Oriented to Place, Oriented to  Time, Oriented to Situation      Admission diagnosis:  Elevated troponin [R79.89] Left shoulder pain [M25.512] AKI (acute kidney injury) [N17.9] Dyspnea, unspecified type [R06.00] Chest pain, unspecified type [R07.9] Patient Active Problem List   Diagnosis Date Noted   Left shoulder pain 03/14/2024   Hypoglycemia, unspecified 05/16/2022   Syncope 05/14/2022   Chronic diastolic heart failure (HCC) 08/26/2021  Acute on chronic combined systolic and diastolic CHF (congestive heart failure) (HCC) 07/11/2021   Acute respiratory failure with hypoxia (HCC) 07/11/2021   Leg wound, left 07/11/2021   Atrial flutter (HCC) 07/11/2021   Acute CHF (congestive heart failure) (HCC) 04/16/2021   PNA (pneumonia) 04/16/2021   COVID-19    Elevated  troponin    NSTEMI (non-ST elevated myocardial infarction) (HCC) 07/31/2020   Grieving 02/16/2020   Bilateral inguinal hernia s/p lap hernia repair w mesh 10/30/2017 10/30/2017   Left femoral hernia s/p lap hernia repair w mesh 10/30/2017 10/30/2017   Loss of weight 01/21/2016   Heme positive stool 01/21/2016   Heart murmur 08/18/2013   Chronic kidney disease, stage III (moderate) (HCC) 07/14/2013   Hyperlipidemia with target low density lipoprotein (LDL) cholesterol less than 100 mg/dL 95/97/7984   Essential hypertension, benign 07/14/2013   Type II or unspecified type diabetes mellitus with renal manifestations, not stated as uncontrolled(250.40) 07/14/2013   Insomnia 07/14/2013   Angioedema of lips 12/26/2012   MGUS (monoclonal gammopathy of unknown significance) 08/01/2011   Gout of right wrist 08/01/2011   S/P coronary artery stent placement 08/01/2011   DM type 2 causing CKD stage 2 (HCC) 08/01/2011   Bradycardia 10/15/2010   Mixed hyperlipidemia due to type 2 diabetes mellitus (HCC) 05/01/2010   Essential hypertension 05/01/2010   CAD, NATIVE VESSEL 05/01/2010   PVD 05/01/2010   PCP:  Sherlynn Madden, MD Pharmacy:   [phone] [place], [place] - [address] [place] [address] Phone: [phone] Fax: [phone]  [facility]) - [place], [place] - [address] [facility] [address] [place] [address] Phone: [phone] Fax: [phone]     Social Drivers of Health (SDOH) Social History: SDOH Screenings   Food Insecurity: No Food Insecurity (03/14/2024)  Housing: Low Risk  (03/14/2024)  Transportation Needs: No Transportation Needs (03/14/2024)  Utilities: Not At Risk (03/14/2024)  Alcohol Screen: Low Risk  (01/25/2018)  Depression (PHQ2-9): Low Risk  (09/29/2022)  Financial Resource Strain: Low Risk  (05/25/2017)  Physical Activity: Insufficiently Active (05/25/2017)   Social Connections: Moderately Isolated (03/14/2024)  Stress: Stress Concern Present (05/25/2017)  Tobacco Use: Medium Risk (03/14/2024)   SDOH Interventions:     Readmission Risk Interventions     No data to display

## 2024-03-15 NOTE — Evaluation (Signed)
 Physical Therapy Evaluation Patient Details Name: Kevin Schwartz MRN: [id number] DOB: 1930/07/30 Today's Date: 03/15/2024  History of Present Illness  89 yo male presents to [facility] hospital on 12/1 from home with SOB and L shoulder pain. PMH non Stemi, PVD CKD, DM2 HTN HLD afib on eliquis .  Clinical Impression  Pt presents to PT with deficits in strength, power, functional mobility, ROM. Pt requires significant physical assistance for bed mobility and transfers. Pt reports intermittent assistance requirements for lateral scoot transfers at baseline and independence with manual wheelchair mobility. Pt has new onset L shoulder pain which radiates down the LLE and is associated with tingling. With assessment of LUE ROM and strength the pt appears to report the deficits are chronic but the pain is new. Pt is at a high risk for falls currently and will benefit from continued PT services in an effort to improve functional mobility quality and to reduce caregiver burden. PT recommends a return to SNF with continue PT services.        If plan is discharge home, recommend the following: Two people to help with walking and/or transfers;Two people to help with bathing/dressing/bathroom;Assistance with cooking/housework;Help with stairs or ramp for entrance;Assist for transportation   Can travel by private vehicle   No    Equipment Recommendations  (defer to SNF)  Recommendations for Other Services       Functional Status Assessment Patient has had a recent decline in their functional status and demonstrates the ability to make significant improvements in function in a reasonable and predictable amount of time.     Precautions / Restrictions Precautions Precautions: Fall Recall of Precautions/Restrictions: Impaired Restrictions Weight Bearing Restrictions Per Provider Order: No      Mobility  Bed Mobility Overal bed mobility: Needs Assistance Bed Mobility: Supine to Sit     Supine to  sit: Mod assist, Used rails, HOB elevated          Transfers Overall transfer level: Needs assistance Equipment used: Ambulation equipment used, 2 person hand held assist Transfers: Sit to/from Stand, Bed to chair/wheelchair/BSC Sit to Stand: Max assist, +2 physical assistance (also stood in STEDY x 1 trial)          Lateral/Scoot Transfers: Max assist      Ambulation/Gait                  Stairs            Wheelchair Mobility     Tilt Bed    Modified Rankin (Stroke Patients Only)       Balance Overall balance assessment: Needs assistance Sitting-balance support: No upper extremity supported, Feet supported Sitting balance-[name]: Fair     Standing balance support: Bilateral upper extremity supported, Reliant on assistive device for balance Standing balance-[name]: Zero Standing balance comment: totalA                             Pertinent Vitals/Pain Pain Assessment Pain Assessment: 0-10 Pain Score: 7  Pain Location: L shoulder Pain Descriptors / Indicators: Tingling Pain Intervention(s): Monitored during session    Home Living Family/patient expects to be discharged to:: Skilled nursing facility                   Additional Comments: pt resides at a nursing home for 2+ years    Prior Function Prior Level of Function : Needs assist  Mobility Comments: pt reports PRN assistance for lateral scoot transfers, has been working on standing in parallel bars. Pt reports independence with wheelchair mobility (unsure if pt reports are reliable based on current functional level)       Extremity/Trunk Assessment   Upper Extremity Assessment Upper Extremity Assessment: LUE deficits/detail LUE Deficits / Details: pt with active shoulder flexion to ~90 degrees, firm end feel with passive motion at 100 degrees and pt reports pain. Elbow PROM WFL, flexion 3/5, wrist/digits ROM WFL LUE Sensation: decreased  light touch    Lower Extremity Assessment Lower Extremity Assessment: Generalized weakness    Cervical / Trunk Assessment Cervical / Trunk Assessment: Kyphotic  Communication   Communication Communication: No apparent difficulties    Cognition Arousal: Alert Behavior During Therapy: WFL for tasks assessed/performed   PT - Cognitive impairments: No family/caregiver present to determine baseline                         Following commands: Intact       Cueing Cueing Techniques: Verbal cues     General Comments General comments (skin integrity, edema, etc.): VSS on RA    Exercises     Assessment/Plan    PT Assessment Patient needs continued PT services  PT Problem List Decreased strength;Decreased range of motion;Decreased activity tolerance;Decreased balance;Decreased mobility;Decreased knowledge of use of DME;Pain;Impaired sensation       PT Treatment Interventions DME instruction;Functional mobility training;Therapeutic activities;Therapeutic exercise;Balance training;Neuromuscular re-education;Cognitive remediation;Patient/family education;Wheelchair mobility training    PT Goals (Current goals can be found in the Care Plan section)  Acute Rehab PT Goals Patient Stated Goal: to improve standing quality, reduce shoulder pain PT Goal Formulation: With patient Time For Goal Achievement: 03/29/24 Potential to Achieve Goals: Fair    Frequency Min 2X/week     Co-evaluation               AM-PAC PT 6 Clicks Mobility  Outcome Measure Help needed turning from your back to your side while in a flat bed without using bedrails?: A Lot Help needed moving from lying on your back to sitting on the side of a flat bed without using bedrails?: A Lot Help needed moving to and from a bed to a chair (including a wheelchair)?: A Lot Help needed standing up from a chair using your arms (e.g., wheelchair or bedside chair)?: A Lot Help needed to walk in hospital  room?: Total Help needed climbing 3-5 steps with a railing? : Total 6 Click Score: 10    End of Session Equipment Utilized During Treatment: Gait belt Activity Tolerance: Patient tolerated treatment well Patient left: in chair;with call bell/phone within reach;with chair alarm set Nurse Communication: Mobility status;Need for lift equipment PT Visit Diagnosis: Other abnormalities of gait and mobility (R26.89);Muscle weakness (generalized) (M62.81);Pain Pain - Right/Left: Left Pain - part of body: Shoulder    Time: 8991-8954 PT Time Calculation (min) (ACUTE ONLY): 37 min   Charges:   PT Evaluation $PT Eval Low Complexity: 1 Low PT Treatments $Therapeutic Activity: 8-22 mins PT General Charges $$ ACUTE PT VISIT: 1 Visit         Bernardino JINNY Ruth, PT, DPT Acute Rehabilitation Office [phone]   Bernardino JINNY Ruth 03/15/2024, 11:03 AM

## 2024-03-15 NOTE — Hospital Course (Signed)
 HPI per Dr. Mario Blanch  Kevin Schwartz is a 89 y.o. male with past medical history  of  non-STEMI, peripheral artery disease, CKD, type 2 diabetes, hypertension hyperlipidemia and A-fib on Eliquis  , Patient brought by EMS from home for complaints of shortness of breath and left shoulder pain since yesterday evening.  He does wear 2 L nasal cannula at bedtime only on initial EMS evaluation patient was O2 sats of 96% on room air and 99 on oxygen patient is alert awake oriented afebrile on initial presentation.     PTA meds include allopurinol  100 mg, apixaban  2.5 twice daily, Lipitor  80, Jardiance  10, Synthroid  25, Protonix  40 mg, Flomax , med reconciliation pending.   **Interim History Patient was evaluated by cardiology for his elevated troponins and they felt no further cardiac workup was needed.  X-ray showed severe glenohumeral osteoarthritis and patient was placed on analgesics with improvement in his shoulder pain.  Will need follow-up with PCP and orthopedic surgery outpatient setting and consider steroid injection versus further outpatient workup.  His renal function remained a little elevated but he was initiated on low-dose IV fluids.  His diuretics will be held and will need to have his renal function repeated within 1 week and outpatient nephrology follow-up.  He is medically stable for discharge at this time as he has been cleared by cardiology.  He will need to follow-up with cardiology, PCP, nephrology and orthopedic surgery outpatient setting.  Assessment and Plan:  Left sided shoulder: With patient's history of CAD and currently elevated troponin the Admitter was concerned that patient's shoulder pain is anginal variant symptom. Troponin trend as below  Will continue patient on atorvastatin  80 mg po Daily. Cardiology consulted and no further cardiac evaluation is required and he is doing well without chest pain and troponins remain flat.  They are recommending holding his Jardiance   and torsemide  until kidney function improves back to baseline.  Given his shoulder pain x-ray done and showed No acute fracture or dislocation but did show Severe glenohumeral osteoarthritis.  Added diclofenac  gel 2 g topically 4 times daily as well as lidocaine  patch 1 patch TD and analgesics with hydromorphone -acetaminophen  1 tab p.o. every 4 as needed for moderate pain.  Will need outpatient evaluation with orthopedic surgery.  PT OT recommending SNF   Chronic Respiratory Failure: On 2L at bedtime. SpO2: 100 % O2 Flow Rate (L/min): 2 L/min No Findings on CT Chest. Respiratory Virus Panel Negative.      Elevated Troponin /Type 2 NSTEMI:  Troponin went from 123 -> 98 -> 122 -> 133. Remains CP free. Elevated in the setting of poor clearance from CKD and Demand ischemia is most likely in this patient and Attributable to his mild worsening of AKI and demand.  Cardiology was consulted and recommending continuing Eliquis  2.5 mg p.o.  twice daily for anticoagulation, atorvastatin  80 mg p.o. daily and ezetimibe  10 mg daily.  Follow-up with cardiology in outpatient setting.   Elevated Anion Gap 2/2 to Lactic Acidosis, suspect more from hypovolemia and dehydration. We will hold his bp meds and monitor. Continued with low rate IVF at 20 cc x 1 days. Also is hemoconcentrated. Hold Diuretics as Below. Normal LFT, and no albuterol . LA improved and so did AG. LA went from 3.2 -> 2.4.  Follow in Clinic    Chronic combined systolic and diastolic congestive heart failure (EF of 45-50%): Mild Cardiomegly noted on CT w/o Contrast. BNP Cards consulted. Continue to hold patient's Jardiance , and any diuretic  therapy. Strict I/O and sodium restricted diet.  Continue with daily weights and will need outpatient cardiology follow-up   AKI on CKD stage V: Baseline Cr is BUN/Cr Trend: Recent Labs  Lab 03/14/24 1445 03/14/24 1451 03/15/24 0427  BUN 86* 76* 89*  CREATININE 3.62* 3.70* 3.77*  -Hold Metolazone , Torsemide , and  Jardiance   -Has MGUS -Avoid Nephrotoxic Medications, Contrast Dyes, Hypotension and Dehydration to Ensure Adequate Renal Perfusion and will need to Renally Adjust Med. Continue to Monitor and Trend Renal Function carefully and repeat CMP within 1 week - Follow-up with nephrology in outpatient setting   Hypothyroidism: Continue Levothyroxine  at 25 mcg.   Chronic Atrial Fibrillation: Continue Telemetry Monitoring and C/w Anticoagulation with Elqiuis   Diabetes Mellitus Type 2: Glycemic protocol. Swallow eval  and carb consistent diet. C/w Moderate Novolog  SSI AC. Hold Jardiance . CTM CBGs per Protocol. CBG Trend:  Recent Labs  Lab 03/14/24 1826 03/14/24 2208 03/15/24 0616 03/15/24 1121  GLUCAP 127* 187* 96 86    Normocytic Anemia: Hgb/Hct Trend:  Recent Labs  Lab 03/14/24 1445 03/14/24 1451 03/15/24 0427  HGB 14.6 16.0 12.3*  HCT 44.5 47.0 36.8*  MCV 96.5  --  94.4  -Check Anemia Panel in the outpatient setting. CTM for S/Sx of Bleeding; No overt bleeding noted. Repeat CBC in the AM    Hypoalbuminemia: Patient's Albumin  Lvl went from 4.2 -> 3.4. CTM & Trend & repeat CMP in the AM   Overweight: Complicates overall prognosis and care. Estimated body mass index is 29.87 kg/m as calculated from the following:   Height as of this encounter: 5' 8 (1.727 m).   Weight as of this encounter: 89.1 kg. Weight Loss and Dietary Counseling given

## 2024-03-16 LAB — HEMOGLOBIN A1C
Hgb A1c MFr Bld: 7.6 % — ABNORMAL HIGH (ref 4.8–5.6)
Mean Plasma Glucose: 171 mg/dL

## 2024-03-19 LAB — CULTURE, BLOOD (ROUTINE X 2)
Culture: NO GROWTH
Culture: NO GROWTH

## 2024-03-28 ENCOUNTER — Telehealth (HOSPITAL_COMMUNITY): Payer: Self-pay

## 2024-03-28 NOTE — Telephone Encounter (Signed)
 Called to confirm/remind patient of their appointment at the [facility] on 03/29/24 10:00.   Appointment:   [x] Confirmed  [] Left mess   [] No answer/No voice mail  [] VM Full/unable to leave message  [] Phone not in service  Patient reminded to bring all medications and/or complete list.  Confirmed patient has transportation. Gave directions, instructed to utilize valet parking.

## 2024-03-29 ENCOUNTER — Encounter (HOSPITAL_COMMUNITY): Payer: Self-pay

## 2024-03-29 ENCOUNTER — Ambulatory Visit (HOSPITAL_COMMUNITY)
Admission: RE | Admit: 2024-03-29 | Discharge: 2024-03-29 | Disposition: A | Source: Ambulatory Visit | Attending: Cardiology

## 2024-03-29 ENCOUNTER — Ambulatory Visit (HOSPITAL_COMMUNITY): Payer: Self-pay | Admitting: Cardiology

## 2024-03-29 VITALS — BP 136/58 | HR 51 | Ht 68.0 in | Wt 190.0 lb

## 2024-03-29 DIAGNOSIS — I4892 Unspecified atrial flutter: Secondary | ICD-10-CM | POA: Diagnosis not present

## 2024-03-29 DIAGNOSIS — I252 Old myocardial infarction: Secondary | ICD-10-CM | POA: Insufficient documentation

## 2024-03-29 DIAGNOSIS — N184 Chronic kidney disease, stage 4 (severe): Secondary | ICD-10-CM | POA: Diagnosis not present

## 2024-03-29 DIAGNOSIS — Z79899 Other long term (current) drug therapy: Secondary | ICD-10-CM | POA: Diagnosis not present

## 2024-03-29 DIAGNOSIS — R600 Localized edema: Secondary | ICD-10-CM | POA: Insufficient documentation

## 2024-03-29 DIAGNOSIS — I130 Hypertensive heart and chronic kidney disease with heart failure and stage 1 through stage 4 chronic kidney disease, or unspecified chronic kidney disease: Secondary | ICD-10-CM | POA: Insufficient documentation

## 2024-03-29 DIAGNOSIS — E1122 Type 2 diabetes mellitus with diabetic chronic kidney disease: Secondary | ICD-10-CM | POA: Insufficient documentation

## 2024-03-29 DIAGNOSIS — I083 Combined rheumatic disorders of mitral, aortic and tricuspid valves: Secondary | ICD-10-CM | POA: Insufficient documentation

## 2024-03-29 DIAGNOSIS — Z7901 Long term (current) use of anticoagulants: Secondary | ICD-10-CM | POA: Diagnosis not present

## 2024-03-29 DIAGNOSIS — I255 Ischemic cardiomyopathy: Secondary | ICD-10-CM | POA: Diagnosis not present

## 2024-03-29 DIAGNOSIS — I2510 Atherosclerotic heart disease of native coronary artery without angina pectoris: Secondary | ICD-10-CM | POA: Diagnosis not present

## 2024-03-29 DIAGNOSIS — I5022 Chronic systolic (congestive) heart failure: Secondary | ICD-10-CM | POA: Diagnosis present

## 2024-03-29 DIAGNOSIS — I5042 Chronic combined systolic (congestive) and diastolic (congestive) heart failure: Secondary | ICD-10-CM

## 2024-03-29 DIAGNOSIS — E1152 Type 2 diabetes mellitus with diabetic peripheral angiopathy with gangrene: Secondary | ICD-10-CM | POA: Insufficient documentation

## 2024-03-29 DIAGNOSIS — Z87891 Personal history of nicotine dependence: Secondary | ICD-10-CM | POA: Diagnosis not present

## 2024-03-29 DIAGNOSIS — I5032 Chronic diastolic (congestive) heart failure: Secondary | ICD-10-CM | POA: Insufficient documentation

## 2024-03-29 DIAGNOSIS — Z955 Presence of coronary angioplasty implant and graft: Secondary | ICD-10-CM | POA: Insufficient documentation

## 2024-03-29 LAB — BASIC METABOLIC PANEL WITH GFR
Anion gap: 12 (ref 5–15)
BUN: 77 mg/dL — ABNORMAL HIGH (ref 8–23)
CO2: 23 mmol/L (ref 22–32)
Calcium: 10.1 mg/dL (ref 8.9–10.3)
Chloride: 104 mmol/L (ref 98–111)
Creatinine, Ser: 2.62 mg/dL — ABNORMAL HIGH (ref 0.61–1.24)
GFR, Estimated: 22 mL/min — ABNORMAL LOW (ref >60)
Glucose, Bld: 117 mg/dL — ABNORMAL HIGH (ref 70–99)
Potassium: 4.9 mmol/L (ref 3.5–5.1)
Sodium: 139 mmol/L (ref 135–145)

## 2024-03-29 LAB — PRO BRAIN NATRIURETIC PEPTIDE: Pro Brain Natriuretic Peptide: 6549 pg/mL — ABNORMAL HIGH (ref <300.0)

## 2024-03-29 MED ORDER — TORSEMIDE 100 MG PO TABS: 100.0000 mg | ORAL_TABLET | Freq: Every day | ORAL | Status: DC

## 2024-03-29 NOTE — Patient Instructions (Signed)
 Medication Changes:  RESTART TORSEMIDE  100MG  ONCE DAILY   Lab Work:  Labs done today, your results will be available in MyChart, we will contact you for abnormal readings.  AND THEN PLEASE REPEAT BASIC METABOLIC PANEL IN 1 WEEK AT THE NURSING FACILITY   Follow-Up in: 3-4 weeks as scheduled with APP clinic   At the [facility], you and your health needs are our priority. We have a designated team specialized in the treatment of Heart Failure. This Care Team includes your primary Heart Failure Specialized Cardiologist (physician), Advanced Practice Providers (APPs- Physician Assistants and Nurse Practitioners), and Pharmacist who all work together to provide you with the care you need, when you need it.   You may see any of the following providers on your designated Care Team at your next follow up:  Dr. Toribio Fuel Dr. Ezra Shuck Dr. Odis Brownie Greig Mosses, NP Caffie Shed, [place] [facility] [place], [place] Beckey Coe, NP Jordan Lee, NP Tinnie Redman, PharmD   Please be sure to bring in all your medications bottles to every appointment.   Need to Contact Us :  If you have any questions or concerns before your next appointment please send us  a message through [place] or call our office at [phone].    TO LEAVE A MESSAGE FOR THE NURSE SELECT OPTION 2, PLEASE LEAVE A MESSAGE INCLUDING: YOUR NAME DATE OF BIRTH CALL BACK NUMBER REASON FOR CALL**this is important as we prioritize the call backs  YOU WILL RECEIVE A CALL BACK THE SAME DAY AS LONG AS YOU CALL BEFORE 4:00 PM

## 2024-03-29 NOTE — Progress Notes (Signed)
 [facility] Note  ERE:Czolijwip, Prashanthi, MD HF Cardioligst: Dr. Cherrie  Reason for Visit: F/u for systolic heart failure  HPI: Kevin Schwartz is an 89 y.o. male with  DM2, HTN, HL, mild renal insufficiency, RAS s/p stenting of L renal artery in 2008, CAD s/p multiple PCIs.  Admitted 4/22 with left shoulder pain -> NSTEMI. Echo EF 45-50%, HK of basal mid anteroseptal wall. Cath multivessel CAD with 85% proximal RCA, 85% mid RCA, 90% distal RCA, 80% mid LAD, 80% OM 2 lesion. Staged PCI with DES to the mid LAD, balloon angioplasty of left circumflex lesion, the diffusely diseased RCA was treated medically.  4/22 Zio - Sinus with 1AVB occasional Wenckebach and transient junctional rhythm.   Admitted 1/23 with COVID. Echo EF 45 - 50% global HK. RV normal.  Mod-sev MR (previously mild).   Admitted 3/23 with ADHF Echo EF 45-50%, RV mildly reduced, PASP 67.4 mmHg, moderate to severe MR, severe TR, mild to moderate AI. Also with epistaxis requiring anterior nasal packing. EP consulted due to Mobitz Type 1 with 3.4 second pauses. Recommended conservative management with no nodal blocking agents.   Seen in ED 07/22/21 with fall, hit head. CT head negative. Follow up 4/23, volume up, REDs  41%. Torsemide  increased to 40 bid x 2 days, then 60 mg daily.   Follow up 6/23, NYHA II-early III, volume stable on torsemide  40 mg. QTc > 600 on ECG and SSRI stopped.  Acute visit 11/12/22 with increased SOB. Volume overloaded, weight up 10 lbs and REDs 40%. Torsemide  increased to 100 mg daily. Seen several weeks after and ReDS still 40%.   HF Visit 04/21/23. Volume overloaded. Torsemide  was increased 100 mg/40 mg.    Admitted 12/25 for left shoulder pain and found to have elevated HS trop 123-98-122-133. He denied CP. Elevated trop felt likely demand ischemia/ poor clearance in setting of AKI on CKD. SCr had bumped to 3.8, previous b/l 2.0-3.0 (pt noted 2 days of poor PO intake). Also noted  to have lactic acidosis, LA 3.2 but showed downward trend. Was seen by cardiology and not felt to be 2/2 cardiac etiology. AKI was managed w/ IVF and was instructed to hold Jardiance , torsemide  and metolazone . Per d/c summary, he was instructed to continue daily KCL supplementation. Discharged back to [facility].   Today he returns today for post hospital f/u. Remains off all diuretics. Wt today 190 lb. Previously 197 lb at last OV. BP well controlled 136/58, ReDS 33% but he has 1+ b/l ankle edema on exam and dyspnea w/ conversation. JVD elevated.   He denies CP. No further left shoulder pain.   ROS: All systems reviewed and negative except as per HPI.   Past Medical History:  Diagnosis Date   Anemia due to chronic kidney disease    Bilateral inguinal hernia    CAD (coronary artery disease) cardiologist-  dr bensimhon   PTCA of OM in 1998, BMS OM in 2000, Cath 9/07 LM ok LAD ok. LCX 95% in OM prior to previous stent RCA. nondominant normal EF normal. Cypher DES to OM 2007 (PLACED PROXIAMAL TO PREVIOUS STENT)   Chronic kidney disease, stage III (moderate) (HCC)    nephrologist-  dr detarding   Depression    Diabetes mellitus type 2, diet-controlled (HCC)    followed by pcp,  last A1c 5.2 on 09-10-2017 in epic   Gout, unspecified    10-29-2017  per pt stable   HLD (hyperlipidemia)  HTN (hypertension)    MGUS (monoclonal gammopathy of unknown significance) previously followed by dr freddie , arnetta and released 08/01/2011   IgG kappa dx 2002 8% plasma cells in bone marrow; no lesions on bone X-rays;   Nocturia    OA (osteoarthritis)    OSA on CPAP    per study 01-30-2004  severe osa , AHI 51.6/hr   PAD (peripheral artery disease)    05-21-2006  left renal artery stenosis, s/p balloon angioplasty and stenting;  last duplex 02/ 2012  normal , arteries patent   S/P coronary artery stent placement    2000--  BMS x1  to OM;   2007-- DES x1  to OM proximal to previous stent   Secondary  hyperparathyroidism of renal origin    Urgency of urination    Wears glasses     Current Outpatient Medications  Medication Sig Dispense Refill   acetaminophen  (TYLENOL ) 500 MG tablet Take 1,000 mg by mouth 3 (three) times daily as needed for mild pain (pain score 1-3) (knee pain).     allopurinol  (ZYLOPRIM ) 100 MG tablet Take 1 tablet (100 mg total) by mouth daily. 90 tablet 3   apixaban  (ELIQUIS ) 2.5 MG TABS tablet Take 2.5 mg by mouth 2 (two) times daily.     atorvastatin  (LIPITOR ) 80 MG tablet Take 80 mg by mouth at bedtime.     cetirizine (ZYRTEC) 10 MG tablet Take 10 mg by mouth daily.     diclofenac  Sodium (VOLTAREN ) 1 % GEL Apply 2 g topically 4 (four) times daily.     ezetimibe  (ZETIA ) 10 MG tablet Take 10 mg by mouth daily.     ferrous sulfate 325 (65 FE) MG EC tablet Take 325 mg by mouth daily.     HYDROcodone -acetaminophen  (NORCO/VICODIN) 5-325 MG tablet Take 1 tablet by mouth every 6 (six) hours as needed for moderate pain (pain score 4-6). 10 tablet 0   levothyroxine  (SYNTHROID ) 25 MCG tablet Take 25 mcg by mouth daily before breakfast.     lidocaine  (LIDODERM ) 5 % Place 1 patch onto the skin daily. Remove & Discard patch within 12 hours or as directed by MD     Menthol, Topical Analgesic, (BIOFREEZE) 4 % GEL Apply 1 application  topically 3 (three) times daily as needed (pain). To right wrist/hand     Menthol, Topical Analgesic, (EUCERIN ITCH RELIEF) 0.1 % LOTN Apply 1 application  topically daily. Apply to chest, abdomen, back, neck, shoulder, rash     methocarbamol (ROBAXIN) 500 MG tablet Take 500 mg by mouth 2 (two) times daily as needed for muscle spasms.     Multiple Vitamin (MULTIVITAMIN) tablet Take 1 tablet by mouth daily.     pantoprazole  (PROTONIX ) 40 MG tablet Take 1 tablet (40 mg total) by mouth daily. 30 tablet 3   Potassium Chloride  ER 20 MEQ TBCR Take 40 mEq by mouth daily.     pregabalin (LYRICA) 25 MG capsule Take 25 mg by mouth at bedtime.     Skin  Protectants, Misc. (EUCERIN) cream Apply 1 Application topically daily as needed for dry skin.     sodium chloride  (OCEAN) 0.65 % SOLN nasal spray Place 1 spray into both nostrils every 4 (four) hours as needed (for epistaxis).     tamsulosin  (FLOMAX ) 0.4 MG CAPS capsule Take 0.4 mg by mouth daily.     Throat Lozenges (COUGH DROPS MT) Use as directed 1 tablet in the mouth or throat 3 (three) times daily as needed (cough).  White Petrolatum  (VASELINE EX) Apply 1 application  topically at bedtime. To bilateral nares     Zinc Oxide 25 % PSTE Apply 1 application  topically 2 (two) times daily. To bottom during incontinence care     [Paused] empagliflozin  (JARDIANCE ) 10 MG TABS tablet Take 10 mg by mouth every other day. (Patient not taking: Reported on 03/29/2024)     [Paused] metolazone  (ZAROXOLYN ) 5 MG tablet Take 5 mg by mouth See admin instructions. Take 5mg  on Tue and Sat only. Give 30 minutes prior to torsemide  dose. (Patient not taking: Reported on 03/29/2024)     [Paused] torsemide  (DEMADEX ) 100 MG tablet Take 100 mg by mouth daily. (Patient not taking: Reported on 03/29/2024)     No current facility-administered medications for this encounter.   Social History   Tobacco Use   Smoking status: Former    Current packs/day: 0.25    Types: Cigarettes   Smokeless tobacco: Never   Tobacco comments:    10-29-2017  per pt was smoking 1pp2d, stopped June 2019  Vaping Use   Vaping status: Never Used  Substance Use Topics   Alcohol use: Not Currently   Drug use: No   Family History  Problem Relation Age of Onset   Heart disease Mother    Healthy Father    Heart disease Sister    Heart disease Sister    Cancer Sister        type unknown   Hypertension Sister    Diabetes Brother    Diabetes Brother    Diabetes Other    Coronary artery disease Other    Cancer Other    Colon cancer Neg Hx    Esophageal cancer Neg Hx    Pancreatic cancer Neg Hx    Stomach cancer Neg Hx    Wt  Readings from Last 3 Encounters:  03/29/24 86.2 kg (190 lb)  03/15/24 89.1 kg (196 lb 6.9 oz)  08/13/23 91.7 kg (202 lb 2 oz)   BP (!) 136/58   Pulse (!) 51   Ht 5' 8 (1.727 m)   Wt 86.2 kg (190 lb) Comment: facility weight this morning  SpO2 93%   BMI 28.89 kg/m   PHYSICAL EXAM: GENERAL: elderly male in WC, NAD Lungs- diminished at the bases bilaterally  CARDIAC:  JVP 8 cm + HJR          Normal rate with regular rhythm. 2/6 murmur LUSB, 1+ b/l ankle edema   ABDOMEN: Soft, non-tender. Mildly distended  EXTREMITIES: Warm and well perfused.  NEUROLOGIC: No obvious FND   ASSESSMENT & PLAN:  1. Chronic Diastolic Heart Failure/Ischemic CM - Echo (4/22): EF 45-50%  - Echo (3/23): EF 45-50%, LV mildly down with global LV HK, mild LVH, RV mildly reduced, severely elevated PASP 67.4 mmHg, moderate to severe MR, severe TR, mild to moderate AI. - Echo 1/25 EF 45-50%, GIDD, RV normal  - NYHA Class IIIb, dyspneic w/ conversation + mild volume overload on exam  - Restart Torsemide  100 mg daily. Check BMP/BNP today and repeat BMP again in 1 wk - keep off Jardiance  and metolazone  for now   - No b-blocker due to HB - No ACE/ARB/ARNi with hx angioedema on ace and arb. - no MRA w/ CKD    2. CAD  - s/p NSTEMI 4/22.  - Cath 4/22 with multivessel CAD with 85% proximal RCA, 85% mid RCA, 90% distal RCA, 80% mid LAD, 80% OM 2 lesion. Staged PCI with DES to the  mid LAD, balloon angioplasty of left circumflex lesion, the diffusely diseased RCA was treated medically. - stable w/o CP  - no ASA given need for Eliquis   - Continue Lipitor  80 mg daily   3. Paroxysmal Atrial Flutter - Continue Eliquis  2.5 mg bid. - CHA2DS2-VASc Score = 5  - Zio (6/22): 39% burden   4. HTN - controlled - continue current regimen   5. Aortic insufficiency/severe TR/moderate MR - Mild AI on echo 4/22 (stable from 2018) - Mild to moderate AI on echo 3/23. - Severe TR on echo 3/23 - Moderate to severe MR on echo  3/23 - Not mTEER candidate currently with lack of symptoms and with frailty/debility.  6. CKD IIIb-IV  - Previous Baseline Scr ~2.0-3.0 - Recent AKI w/ bump in SCr to 3.8 during hospitalization - Jardiance  remains on hold per above  - High risk for hyperkalemia, may need to stop KCl supplementation, check BMP today    - needs post hospital f/u w/ nephrology   F/u w/ APP in 3-4 wks to reassess volume status    Raejean Swinford, PA-C  10:03 AM

## 2024-03-29 NOTE — Progress Notes (Signed)
 ReDS Vest / Clip - 03/29/24 1025       ReDS Vest / Clip   Station Marker D    Ruler Value 32    ReDS Value Range Low volume    ReDS Actual Value 33

## 2024-04-08 ENCOUNTER — Ambulatory Visit (HOSPITAL_COMMUNITY)
Admission: RE | Admit: 2024-04-08 | Discharge: 2024-04-08 | Disposition: A | Discharge status: Home / Self Care | Source: Ambulatory Visit | Attending: Internal Medicine | Admitting: Internal Medicine

## 2024-04-08 DIAGNOSIS — I5032 Chronic diastolic (congestive) heart failure: Secondary | ICD-10-CM

## 2024-04-25 ENCOUNTER — Telehealth (HOSPITAL_COMMUNITY): Payer: Self-pay

## 2024-04-25 NOTE — Progress Notes (Signed)
 "  Advanced Heart Failure Clinic Note  ERE:Czolijwip, Prashanthi, MD HF Cardioligst: Dr. Cherrie  Reason for Visit: F/u for systolic heart failure  HPI: Kevin Schwartz is an 89 y.o. male with  DM2, HTN, HL, mild renal insufficiency, RAS s/p stenting of L renal artery in 2008, CAD s/p multiple PCIs.  Admitted 4/22 with left shoulder pain -> NSTEMI. Echo EF 45-50%, HK of basal mid anteroseptal wall. Cath multivessel CAD with 85% proximal RCA, 85% mid RCA, 90% distal RCA, 80% mid LAD, 80% OM 2 lesion. Staged PCI with DES to the mid LAD, balloon angioplasty of left circumflex lesion, the diffusely diseased RCA was treated medically.  4/22 Zio - Sinus with 1AVB occasional Wenckebach and transient junctional rhythm.   Admitted 1/23 with COVID. Echo EF 45 - 50% global HK. RV normal.  Mod-sev MR (previously mild).   Admitted 3/23 with ADHF Echo EF 45-50%, RV mildly reduced, PASP 67.4 mmHg, moderate to severe MR, severe TR, mild to moderate AI. Also with epistaxis requiring anterior nasal packing. EP consulted due to Mobitz Type 1 with 3.4 second pauses. Recommended conservative management with no nodal blocking agents.   Seen in ED 07/22/21 with fall, hit head. CT head negative. Follow up 4/23, volume up, REDs  41%. Torsemide  increased to 40 bid x 2 days, then 60 mg daily.   Follow up 6/23, NYHA II-early III, volume stable on torsemide  40 mg. QTc > 600 on ECG and SSRI stopped.  Acute visit 11/12/22 with increased SOB. Volume overloaded, weight up 10 lbs and REDs 40%. Torsemide  increased to 100 mg daily. Seen several weeks after and ReDS still 40%.   HF Visit 04/21/23. Volume overloaded. Torsemide  was increased 100 mg/40 mg.    Admitted 12/25 for left shoulder pain and found to have elevated HS trop 123-98-122-133. He denied CP. Elevated trop felt likely demand ischemia/ poor clearance in setting of AKI on CKD. SCr had bumped to 3.8, previous b/l 2.0-3.0 (pt noted 2 days of poor PO intake). Also noted  to have lactic acidosis, LA 3.2 but showed downward trend. Was seen by cardiology and not felt to be 2/2 cardiac etiology. AKI was managed w/ IVF and was instructed to hold Jardiance , torsemide  and metolazone . Per d/c summary, he was instructed to continue daily KCL supplementation. Discharged back to [facility].   Today he returns today for post hospital f/u. Remains off all diuretics. Wt today 190 lb. Previously 197 lb at last OV. BP well controlled 136/58, ReDS 33% but he has 1+ b/l ankle edema on exam and dyspnea w/ conversation. JVD elevated.   He denies CP. No further left shoulder pain.   ROS: All systems reviewed and negative except as per HPI.   Past Medical History:  Diagnosis Date   Anemia due to chronic kidney disease    Bilateral inguinal hernia    CAD (coronary artery disease) cardiologist-  dr bensimhon   PTCA of OM in 1998, BMS OM in 2000, Cath 9/07 LM ok LAD ok. LCX 95% in OM prior to previous stent RCA. nondominant normal EF normal. Cypher DES to OM 2007 (PLACED PROXIAMAL TO PREVIOUS STENT)   Chronic kidney disease, stage III (moderate) (HCC)    nephrologist-  dr detarding   Depression    Diabetes mellitus type 2, diet-controlled (HCC)    followed by pcp,  last A1c 5.2 on 09-10-2017 in epic   Gout, unspecified    10-29-2017  per pt stable   HLD (hyperlipidemia)  HTN (hypertension)    MGUS (monoclonal gammopathy of unknown significance) previously followed by dr freddie , arnetta and released 08/01/2011   IgG kappa dx 2002 8% plasma cells in bone marrow; no lesions on bone X-rays;   Nocturia    OA (osteoarthritis)    OSA on CPAP    per study 01-30-2004  severe osa , AHI 51.6/hr   PAD (peripheral artery disease)    05-21-2006  left renal artery stenosis, s/p balloon angioplasty and stenting;  last duplex 02/ 2012  normal , arteries patent   S/P coronary artery stent placement    2000--  BMS x1  to OM;   2007-- DES x1  to OM proximal to previous stent   Secondary  hyperparathyroidism of renal origin    Urgency of urination    Wears glasses     Current Outpatient Medications  Medication Sig Dispense Refill   acetaminophen  (TYLENOL ) 500 MG tablet Take 1,000 mg by mouth 3 (three) times daily as needed for mild pain (pain score 1-3) (knee pain).     allopurinol  (ZYLOPRIM ) 100 MG tablet Take 1 tablet (100 mg total) by mouth daily. 90 tablet 3   apixaban  (ELIQUIS ) 2.5 MG TABS tablet Take 2.5 mg by mouth 2 (two) times daily.     atorvastatin  (LIPITOR ) 80 MG tablet Take 80 mg by mouth at bedtime.     cetirizine (ZYRTEC) 10 MG tablet Take 10 mg by mouth daily.     diclofenac  Sodium (VOLTAREN ) 1 % GEL Apply 2 g topically 4 (four) times daily.     ezetimibe  (ZETIA ) 10 MG tablet Take 10 mg by mouth daily.     ferrous sulfate 325 (65 FE) MG EC tablet Take 325 mg by mouth daily.     HYDROcodone -acetaminophen  (NORCO/VICODIN) 5-325 MG tablet Take 1 tablet by mouth every 6 (six) hours as needed for moderate pain (pain score 4-6). 10 tablet 0   levothyroxine  (SYNTHROID ) 25 MCG tablet Take 25 mcg by mouth daily before breakfast.     lidocaine  (LIDODERM ) 5 % Place 1 patch onto the skin daily. Remove & Discard patch within 12 hours or as directed by MD     Menthol, Topical Analgesic, (BIOFREEZE) 4 % GEL Apply 1 application  topically 3 (three) times daily as needed (pain). To right wrist/hand     Menthol, Topical Analgesic, (EUCERIN ITCH RELIEF) 0.1 % LOTN Apply 1 application  topically daily. Apply to chest, abdomen, back, neck, shoulder, rash     methocarbamol (ROBAXIN) 500 MG tablet Take 500 mg by mouth 2 (two) times daily as needed for muscle spasms.     Multiple Vitamin (MULTIVITAMIN) tablet Take 1 tablet by mouth daily.     pantoprazole  (PROTONIX ) 40 MG tablet Take 1 tablet (40 mg total) by mouth daily. 30 tablet 3   Potassium Chloride  ER 20 MEQ TBCR Take 40 mEq by mouth daily.     pregabalin (LYRICA) 25 MG capsule Take 25 mg by mouth at bedtime.     Skin  Protectants, Misc. (EUCERIN) cream Apply 1 Application topically daily as needed for dry skin.     sodium chloride  (OCEAN) 0.65 % SOLN nasal spray Place 1 spray into both nostrils every 4 (four) hours as needed (for epistaxis).     tamsulosin  (FLOMAX ) 0.4 MG CAPS capsule Take 0.4 mg by mouth daily.     Throat Lozenges (COUGH DROPS MT) Use as directed 1 tablet in the mouth or throat 3 (three) times daily as needed (cough).  torsemide  (DEMADEX ) 100 MG tablet Take 1 tablet (100 mg total) by mouth daily.     White Petrolatum  (VASELINE EX) Apply 1 application  topically at bedtime. To bilateral nares     Zinc Oxide 25 % PSTE Apply 1 application  topically 2 (two) times daily. To bottom during incontinence care     No current facility-administered medications for this visit.   Social History   Tobacco Use   Smoking status: Former    Current packs/day: 0.25    Types: Cigarettes   Smokeless tobacco: Never   Tobacco comments:    10-29-2017  per pt was smoking 1pp2d, stopped June 2019  Vaping Use   Vaping status: Never Used  Substance Use Topics   Alcohol use: Not Currently   Drug use: No   Family History  Problem Relation Age of Onset   Heart disease Mother    Healthy Father    Heart disease Sister    Heart disease Sister    Cancer Sister        type unknown   Hypertension Sister    Diabetes Brother    Diabetes Brother    Diabetes Other    Coronary artery disease Other    Cancer Other    Colon cancer Neg Hx    Esophageal cancer Neg Hx    Pancreatic cancer Neg Hx    Stomach cancer Neg Hx    Wt Readings from Last 3 Encounters:  03/29/24 86.2 kg (190 lb)  03/15/24 89.1 kg (196 lb 6.9 oz)  08/13/23 91.7 kg (202 lb 2 oz)   There were no vitals taken for this visit.  PHYSICAL EXAM: GENERAL: elderly male in WC, NAD Lungs- diminished at the bases bilaterally  CARDIAC:  JVP 8 cm + HJR          Normal rate with regular rhythm. 2/6 murmur LUSB, 1+ b/l ankle edema   ABDOMEN:  Soft, non-tender. Mildly distended  EXTREMITIES: Warm and well perfused.  NEUROLOGIC: No obvious FND   ASSESSMENT & PLAN:  1. Chronic Diastolic Heart Failure/Ischemic CM - Echo (4/22): EF 45-50%  - Echo (3/23): EF 45-50%, LV mildly down with global LV HK, mild LVH, RV mildly reduced, severely elevated PASP 67.4 mmHg, moderate to severe MR, severe TR, mild to moderate AI. - Echo 1/25 EF 45-50%, GIDD, RV normal  - NYHA Class IIIb, dyspneic w/ conversation + mild volume overload on exam  - Restart Torsemide  100 mg daily. Check BMP/BNP today and repeat BMP again in 1 wk - keep off Jardiance  and metolazone  for now   - No b-blocker due to HB - No ACE/ARB/ARNi with hx angioedema on ace and arb. - no MRA w/ CKD    2. CAD  - s/p NSTEMI 4/22.  - Cath 4/22 with multivessel CAD with 85% proximal RCA, 85% mid RCA, 90% distal RCA, 80% mid LAD, 80% OM 2 lesion. Staged PCI with DES to the mid LAD, balloon angioplasty of left circumflex lesion, the diffusely diseased RCA was treated medically. - stable w/o CP  - no ASA given need for Eliquis   - Continue Lipitor  80 mg daily   3. Paroxysmal Atrial Flutter - Continue Eliquis  2.5 mg bid. - CHA2DS2-VASc Score = 5  - Zio (6/22): 39% burden   4. HTN - controlled - continue current regimen   5. Aortic insufficiency/severe TR/moderate MR - Mild AI on echo 4/22 (stable from 2018) - Mild to moderate AI on echo 3/23. - Severe TR on  echo 3/23 - Moderate to severe MR on echo 3/23 - Not mTEER candidate currently with lack of symptoms and with frailty/debility.  6. CKD IIIb-IV  - Previous Baseline Scr ~2.0-3.0 - Recent AKI w/ bump in SCr to 3.8 during hospitalization - Jardiance  remains on hold per above  - High risk for hyperkalemia, may need to stop KCl supplementation, check BMP today    - needs post hospital f/u w/ nephrology   F/u w/ APP in 3-4 wks to reassess volume status    Harlene CHRISTELLA Gainer, FNP  1:57 PM "

## 2024-04-25 NOTE — Telephone Encounter (Signed)
 Called to confirm/remind patient of their appointment at the [facility] on 04/26/24.   Appointment:   [] Confirmed  [] Left mess   [] No answer/No voice mail  [x] VM Full/unable to leave message  [] Phone not in service

## 2024-04-26 ENCOUNTER — Ambulatory Visit (HOSPITAL_COMMUNITY): Payer: Self-pay | Admitting: Family Medicine

## 2024-04-26 ENCOUNTER — Ambulatory Visit (HOSPITAL_COMMUNITY)
Admission: RE | Admit: 2024-04-26 | Discharge: 2024-04-26 | Disposition: A | Source: Ambulatory Visit | Attending: Family Medicine

## 2024-04-26 ENCOUNTER — Encounter (HOSPITAL_COMMUNITY): Payer: Self-pay

## 2024-04-26 VITALS — BP 136/78 | HR 78 | Wt 198.0 lb

## 2024-04-26 DIAGNOSIS — Z7901 Long term (current) use of anticoagulants: Secondary | ICD-10-CM | POA: Insufficient documentation

## 2024-04-26 DIAGNOSIS — I083 Combined rheumatic disorders of mitral, aortic and tricuspid valves: Secondary | ICD-10-CM | POA: Diagnosis not present

## 2024-04-26 DIAGNOSIS — I2510 Atherosclerotic heart disease of native coronary artery without angina pectoris: Secondary | ICD-10-CM | POA: Diagnosis not present

## 2024-04-26 DIAGNOSIS — I480 Paroxysmal atrial fibrillation: Secondary | ICD-10-CM | POA: Diagnosis not present

## 2024-04-26 DIAGNOSIS — I130 Hypertensive heart and chronic kidney disease with heart failure and stage 1 through stage 4 chronic kidney disease, or unspecified chronic kidney disease: Secondary | ICD-10-CM | POA: Diagnosis not present

## 2024-04-26 DIAGNOSIS — Z9981 Dependence on supplemental oxygen: Secondary | ICD-10-CM | POA: Insufficient documentation

## 2024-04-26 DIAGNOSIS — I252 Old myocardial infarction: Secondary | ICD-10-CM | POA: Diagnosis not present

## 2024-04-26 DIAGNOSIS — Z8679 Personal history of other diseases of the circulatory system: Secondary | ICD-10-CM | POA: Diagnosis not present

## 2024-04-26 DIAGNOSIS — I4891 Unspecified atrial fibrillation: Secondary | ICD-10-CM | POA: Diagnosis not present

## 2024-04-26 DIAGNOSIS — Z87891 Personal history of nicotine dependence: Secondary | ICD-10-CM | POA: Diagnosis not present

## 2024-04-26 DIAGNOSIS — E1122 Type 2 diabetes mellitus with diabetic chronic kidney disease: Secondary | ICD-10-CM | POA: Diagnosis not present

## 2024-04-26 DIAGNOSIS — I5032 Chronic diastolic (congestive) heart failure: Secondary | ICD-10-CM | POA: Diagnosis present

## 2024-04-26 DIAGNOSIS — N1832 Chronic kidney disease, stage 3b: Secondary | ICD-10-CM | POA: Diagnosis not present

## 2024-04-26 DIAGNOSIS — Z7984 Long term (current) use of oral hypoglycemic drugs: Secondary | ICD-10-CM | POA: Diagnosis not present

## 2024-04-26 DIAGNOSIS — D631 Anemia in chronic kidney disease: Secondary | ICD-10-CM | POA: Diagnosis not present

## 2024-04-26 DIAGNOSIS — I38 Endocarditis, valve unspecified: Secondary | ICD-10-CM | POA: Diagnosis not present

## 2024-04-26 DIAGNOSIS — I10 Essential (primary) hypertension: Secondary | ICD-10-CM | POA: Diagnosis not present

## 2024-04-26 DIAGNOSIS — I255 Ischemic cardiomyopathy: Secondary | ICD-10-CM | POA: Insufficient documentation

## 2024-04-26 DIAGNOSIS — Z955 Presence of coronary angioplasty implant and graft: Secondary | ICD-10-CM | POA: Insufficient documentation

## 2024-04-26 DIAGNOSIS — Z79899 Other long term (current) drug therapy: Secondary | ICD-10-CM | POA: Diagnosis not present

## 2024-04-26 DIAGNOSIS — E1151 Type 2 diabetes mellitus with diabetic peripheral angiopathy without gangrene: Secondary | ICD-10-CM | POA: Insufficient documentation

## 2024-04-26 DIAGNOSIS — I4892 Unspecified atrial flutter: Secondary | ICD-10-CM | POA: Insufficient documentation

## 2024-04-26 DIAGNOSIS — N183 Chronic kidney disease, stage 3 unspecified: Secondary | ICD-10-CM | POA: Diagnosis not present

## 2024-04-26 LAB — CBC
HCT: 32.5 % — ABNORMAL LOW (ref 39.0–52.0)
Hemoglobin: 10.9 g/dL — ABNORMAL LOW (ref 13.0–17.0)
MCH: 32.3 pg (ref 26.0–34.0)
MCHC: 33.5 g/dL (ref 30.0–36.0)
MCV: 96.4 fL (ref 80.0–100.0)
Platelets: 388 K/uL (ref 150–400)
RBC: 3.37 MIL/uL — ABNORMAL LOW (ref 4.22–5.81)
RDW: 15.3 % (ref 11.5–15.5)
WBC: 9 K/uL (ref 4.0–10.5)
nRBC: 0 % (ref 0.0–0.2)

## 2024-04-26 LAB — IRON AND TIBC
Iron: 42 ug/dL — ABNORMAL LOW (ref 45–182)
Saturation Ratios: 18 % (ref 17.9–39.5)
TIBC: 235 ug/dL — ABNORMAL LOW (ref 250–450)
UIBC: 194 ug/dL

## 2024-04-26 LAB — BASIC METABOLIC PANEL WITH GFR
Anion gap: 12 (ref 5–15)
BUN: 55 mg/dL — ABNORMAL HIGH (ref 8–23)
CO2: 28 mmol/L (ref 22–32)
Calcium: 9.8 mg/dL (ref 8.9–10.3)
Chloride: 101 mmol/L (ref 98–111)
Creatinine, Ser: 2.78 mg/dL — ABNORMAL HIGH (ref 0.61–1.24)
GFR, Estimated: 21 mL/min — ABNORMAL LOW
Glucose, Bld: 118 mg/dL — ABNORMAL HIGH (ref 70–99)
Potassium: 4.3 mmol/L (ref 3.5–5.1)
Sodium: 141 mmol/L (ref 135–145)

## 2024-04-26 LAB — FERRITIN: Ferritin: 436 ng/mL — ABNORMAL HIGH (ref 24–336)

## 2024-04-26 LAB — TSH: TSH: 3.83 u[IU]/mL (ref 0.350–4.500)

## 2024-04-26 LAB — PRO BRAIN NATRIURETIC PEPTIDE: Pro Brain Natriuretic Peptide: 12159 pg/mL — ABNORMAL HIGH

## 2024-04-26 MED ORDER — EMPAGLIFLOZIN 10 MG PO TABS: 10.0000 mg | ORAL_TABLET | Freq: Every day | ORAL | Status: AC

## 2024-04-26 NOTE — Progress Notes (Signed)
"   ReDS Vest / Clip - 04/26/24 1000       ReDS Vest / Clip   Station Marker D    Ruler Value 39    ReDS Value Range Moderate volume overload    ReDS Actual Value 38          "

## 2024-04-26 NOTE — Patient Instructions (Addendum)
 Great to see you today!  RESTART Jardiance  10 mg (1 tablet) daily  Labs done today, your results will be available in MyChart, we will contact you for abnormal readings.  Your physician recommends that you schedule a follow-up appointment in 2 weeks as scheduled  If you have any questions or concerns before your next appointment please send us  a message through [place] or call our office at [phone].    TO LEAVE A MESSAGE FOR THE NURSE SELECT OPTION 2, PLEASE LEAVE A MESSAGE INCLUDING: YOUR NAME DATE OF BIRTH CALL BACK NUMBER REASON FOR CALL**this is important as we prioritize the call backs  YOU WILL RECEIVE A CALL BACK THE SAME DAY AS LONG AS YOU CALL BEFORE 4:00 PM At the [facility], you and your health needs are our priority. As part of our continuing mission to provide you with exceptional heart care, we have created designated Provider Care Teams. These Care Teams include your primary Cardiologist (physician) and Advanced Practice Providers (APPs- Physician Assistants and Nurse Practitioners) who all work together to provide you with the care you need, when you need it.   You may see any of the following providers on your designated Care Team at your next follow up: Dr Toribio Fuel Dr Ezra Shuck Dr. Morene Brownie Greig Mosses, NP Caffie Shed, [place] [facility], [place] Beckey Coe, NP Jordan Lee, NP Ellouise Class, NP Tinnie Redman, PharmD Jaun Bash, PharmD   Please be sure to bring in all your medications bottles to every appointment.    Thank you for choosing [facility]

## 2024-05-06 NOTE — Progress Notes (Incomplete)
 "  Advanced Heart Failure Clinic Note  ERE:Czolijwip, Prashanthi, MD HF Cardioligst: Dr. Cherrie  HPI: [name] is an 89 y.o. male with DM2, HTN, HL, mild renal insufficiency, RAS s/p stenting of L renal artery in 2008, CAD s/p multiple PCIs.  Admitted 4/22 with left shoulder pain -> NSTEMI. Echo EF 45-50%, HK of basal mid anteroseptal wall. Cath multivessel CAD with 85% proximal RCA, 85% mid RCA, 90% distal RCA, 80% mid LAD, 80% OM 2 lesion. Staged PCI with DES to the mid LAD, balloon angioplasty of left circumflex lesion, the diffusely diseased RCA was treated medically.  4/22 Zio - Sinus with 1AVB occasional Wenckebach and transient junctional rhythm.   Admitted 1/23 with COVID. Echo EF 45 - 50% global HK. RV normal.  Mod-sev MR (previously mild).   Admitted 3/23 with ADHF Echo EF 45-50%, RV mildly reduced, PASP 67.4 mmHg, moderate to severe MR, severe TR, mild to moderate AI. Also with epistaxis requiring anterior nasal packing. EP consulted due to Mobitz Type 1 with 3.4 second pauses. Recommended conservative management with no nodal blocking agents.   Seen in ED 07/22/21 with fall, hit head. CT head negative. Follow up 4/23, volume up, REDs  41%. Torsemide  increased to 40 bid x 2 days, then 60 mg daily.   Follow up 6/23, NYHA II-early III, volume stable on torsemide  40 mg. QTc > 600 on ECG and SSRI stopped.  Acute visit 11/12/22 with increased SOB. Volume overloaded, weight up 10 lbs and REDs 40%. Torsemide  increased to 100 mg daily. Seen several weeks after and ReDS still 40%.   HF Visit 04/21/23. Volume overloaded. Torsemide  was increased 100 mg/40 mg.    Admitted 12/25 for left shoulder pain and found to have elevated HS trop [id number]. He denied CP. Elevated trop felt likely demand ischemia/ poor clearance in setting of AKI on CKD. SCr had bumped to 3.8, previous b/l 2.0-3.0 (pt noted 2 days of poor PO intake). Also noted to have lactic acidosis, LA 3.2 but showed downward  trend. Was seen by cardiology and not felt to be 2/2 cardiac etiology. AKI was managed w/ IVF and was instructed to hold Jardiance , torsemide  and metolazone . Per d/c summary, he was instructed to continue daily KCL supplementation. Discharged back to [facility].   Today he returns for HF follow up. Overall feeling fair. Breathing is off and on. He is mostly WC dependent but can't stand for transfers. He feels SOB sitting in WC. Abdomen is swelling.  Urinating briskly on torsemide  100 daily. Wears 2L oxygen at night, and PRN during the day. Denies palpitations, abnormal bleeding, CP, dizziness,or PND/Orthopnea. Appetite ok. Weighing every other day at facility, most recent weight was 185 lbs Taking all medications provided by facility. Resides at [facility].  ROS: All systems reviewed and negative except as per HPI.   Past Medical History:  Diagnosis Date   Anemia due to chronic kidney disease    Bilateral inguinal hernia    CAD (coronary artery disease) cardiologist-  dr bensimhon   PTCA of OM in 1998, BMS OM in 2000, Cath 9/07 LM ok LAD ok. LCX 95% in OM prior to previous stent RCA. nondominant normal EF normal. Cypher DES to OM 2007 (PLACED PROXIAMAL TO PREVIOUS STENT)   Chronic kidney disease, stage III (moderate) [facility])    nephrologist-  dr detarding   Depression    Diabetes mellitus type 2, diet-controlled (HCC)    followed by pcp,  last A1c 5.2 on 09-10-2017 in epic  Gout, unspecified    10-29-2017  per pt stable   HLD (hyperlipidemia)    HTN (hypertension)    MGUS (monoclonal gammopathy of unknown significance) previously followed by dr freddie , arnetta and released 08/01/2011   IgG kappa dx 2002 8% plasma cells in bone marrow; no lesions on bone X-rays;   Nocturia    OA (osteoarthritis)    OSA on CPAP    per study 01-30-2004  severe osa , AHI 51.6/hr   PAD (peripheral artery disease)    05-21-2006  left renal artery stenosis, s/p balloon angioplasty and stenting;  last duplex  02/ 2012  normal , arteries patent   S/P coronary artery stent placement    2000--  BMS x1  to OM;   2007-- DES x1  to OM proximal to previous stent   Secondary hyperparathyroidism of renal origin    Urgency of urination    Wears glasses    Current Outpatient Medications  Medication Sig Dispense Refill   acetaminophen  (TYLENOL ) 500 MG tablet Take 1,000 mg by mouth 3 (three) times daily as needed for mild pain (pain score 1-3) (knee pain).     allopurinol  (ZYLOPRIM ) 100 MG tablet Take 1 tablet (100 mg total) by mouth daily. 90 tablet 3   apixaban  (ELIQUIS ) 2.5 MG TABS tablet Take 2.5 mg by mouth 2 (two) times daily.     atorvastatin  (LIPITOR ) 80 MG tablet Take 80 mg by mouth at bedtime.     cetirizine (ZYRTEC) 10 MG tablet Take 10 mg by mouth daily.     diclofenac  Sodium (VOLTAREN ) 1 % GEL Apply 2 g topically 4 (four) times daily.     empagliflozin  (JARDIANCE ) 10 MG TABS tablet Take 1 tablet (10 mg total) by mouth daily before breakfast.     ezetimibe  (ZETIA ) 10 MG tablet Take 10 mg by mouth daily.     ferrous sulfate 325 (65 FE) MG EC tablet Take 325 mg by mouth daily.     levothyroxine  (SYNTHROID ) 25 MCG tablet Take 25 mcg by mouth daily before breakfast.     lidocaine  (LIDODERM ) 5 % Place 1 patch onto the skin daily. Remove & Discard patch within 12 hours or as directed by MD     Menthol, Topical Analgesic, (EUCERIN ITCH RELIEF) 0.1 % LOTN Apply 1 application  topically daily. Apply to chest, abdomen, back, neck, shoulder, rash     methocarbamol (ROBAXIN) 500 MG tablet Take 500 mg by mouth 2 (two) times daily as needed for muscle spasms.     metolazone  (ZAROXOLYN ) 2.5 MG tablet Take 1 tablet (2.5 mg total) by mouth daily.     pantoprazole  (PROTONIX ) 40 MG tablet Take 1 tablet (40 mg total) by mouth daily. 30 tablet 3   Potassium Chloride  ER 20 MEQ TBCR Take 40 mEq by mouth daily. (Patient taking differently: Take 20 mEq by mouth daily.)     pregabalin (LYRICA) 25 MG capsule Take 25 mg by  mouth at bedtime.     Skin Protectants, Misc. (EUCERIN) cream Apply 1 Application topically daily as needed for dry skin.     sodium chloride  (OCEAN) 0.65 % SOLN nasal spray Place 1 spray into both nostrils every 4 (four) hours as needed (for epistaxis).     tamsulosin  (FLOMAX ) 0.4 MG CAPS capsule Take 0.4 mg by mouth daily.     Throat Lozenges (COUGH DROPS MT) Use as directed 1 tablet in the mouth or throat 3 (three) times daily as needed (cough).  White Petrolatum  (VASELINE EX) Apply 1 application  topically at bedtime. To bilateral nares     Zinc Oxide 25 % PSTE Apply 1 application  topically 2 (two) times daily. To bottom during incontinence care     HYDROcodone -acetaminophen  (NORCO/VICODIN) 5-325 MG tablet Take 1 tablet by mouth every 6 (six) hours as needed for moderate pain (pain score 4-6). (Patient not taking: Reported on 05/10/2024) 10 tablet 0   Menthol, Topical Analgesic, (BIOFREEZE) 4 % GEL Apply 1 application  topically 3 (three) times daily as needed (pain). To right wrist/hand (Patient not taking: Reported on 05/10/2024)     Multiple Vitamin (MULTIVITAMIN) tablet Take 1 tablet by mouth daily. (Patient not taking: Reported on 05/10/2024)     torsemide  (DEMADEX ) 100 MG tablet Take 1 tablet (100 mg total) by mouth 2 (two) times daily.     No current facility-administered medications for this encounter.   Social History   Tobacco Use   Smoking status: Former    Current packs/day: 0.25    Types: Cigarettes   Smokeless tobacco: Never   Tobacco comments:    10-29-2017  per pt was smoking 1pp2d, stopped June 2019  Vaping Use   Vaping status: Never Used  Substance Use Topics   Alcohol use: Not Currently   Drug use: No   Family History  Problem Relation Age of Onset   Heart disease Mother    Healthy Father    Heart disease Sister    Heart disease Sister    Cancer Sister        type unknown   Hypertension Sister    Diabetes Brother    Diabetes Brother    Diabetes Other     Coronary artery disease Other    Cancer Other    Colon cancer Neg Hx    Esophageal cancer Neg Hx    Pancreatic cancer Neg Hx    Stomach cancer Neg Hx    Wt Readings from Last 3 Encounters:  05/10/24 89.3 kg (196 lb 12.8 oz)  04/26/24 89.8 kg (198 lb)  03/29/24 86.2 kg (190 lb)   BP (!) 112/58   Pulse 75   Wt 89.3 kg (196 lb 12.8 oz) Comment: Patient's weighed on 05/06/24 at facility.  SpO2 99% Comment: Patient is on 2-liters of wall oxygen.  BMI 29.92 kg/m   PHYSICAL EXAM: General:  NAD. No resp difficulty, arrived in [facility] on oxygen, elderly HEENT: Normal Neck: Supple. JVP 10 Cor: Irregular rate & rhythm. No rubs, gallops, 2/6 SEM Lungs: Clear Abdomen: Soft, nontender, nondistended.  Extremities: No cyanosis, clubbing, rash, 1+ BLE edema Neuro: Alert & oriented x 3, moves all 4 extremities w/o difficulty. Affect pleasant.  ReDs reading: 50%, abnormal  ASSESSMENT & PLAN:  1. Chronic Diastolic Heart Failure/Ischemic CM - Echo (4/22): EF 45-50%  - Echo (3/23): EF 45-50%, LV mildly down with global LV HK, mild LVH, RV mildly reduced, severely elevated PASP 67.4 mmHg, moderate to severe MR, severe TR, mild to moderate AI. - Echo 1/25 EF 45-50%, GIDD, RV normal  - NYHA IIIb, volume up ReDs 50% GDMT limited by recent AKI - Increase torsemide  to 100 mg bid, increase KCL to 20 bid. - Add metolazone  2.5 mg + extra 40 KCL today, and every Tuesday - Continue Jardiance  10 mg daily - No b-blocker due to HB - No ACE/ARB/ARNi with hx angioedema on ace and arb. - No MRA w/ CKD  - Labs today. Repeat BMET at close follow up in 1 week  2. CAD  - s/p NSTEMI 4/22.  - Cath 4/22 with multivessel CAD with 85% proximal RCA, 85% mid RCA, 90% distal RCA, 80% mid LAD, 80% OM 2 lesion. Staged PCI with DES to the mid LAD, balloon angioplasty of left circumflex lesion, the diffusely diseased RCA was treated medically. - No chest pain - no ASA given need for Eliquis   - Continue atorvastatin  80 mg  daily   3. Paroxysmal Atrial Flutter/AFib - CHA2DS2-VASc Score = 5  - Zio (6/22): 39% burden  - Seen by EP during 2024 admission, felt to be not a good candidate for AAD - Avoid nodal blockers with hx of HB and hxn rhythm. - He is in rate controlled AF today - Continue Eliquis  2.5 mg bid. No bleeding issues - Discussed DCCV with patient today. He prefers to hold off. Discucssed with Dr. Bensimhon  4. HTN - BP well-controlled - Continue current regimen   5. Aortic insufficiency/severe TR/moderate MR - Mild AI on echo 4/22 (stable from 2018) - Mild to moderate AI on echo 3/23. - Severe TR on echo 3/23 - Moderate to severe MR on echo 3/23 - Not mTEER candidate currently with lack of symptoms and with frailty/debility.  6. CKD IIIb-IV  - Baseline Scr ~2.0-3.0 - Continue Jardiance , as above - Follows with Nephrology - Labs today.  Follow up in 1 week with APP; he is high risk for re-admission.  Harlene CHRISTELLA Gainer, FNP  2:23 PM "

## 2024-05-10 ENCOUNTER — Encounter (HOSPITAL_COMMUNITY): Payer: Self-pay

## 2024-05-10 ENCOUNTER — Ambulatory Visit (HOSPITAL_COMMUNITY)
Admission: RE | Admit: 2024-05-10 | Discharge: 2024-05-10 | Disposition: A | Source: Ambulatory Visit | Attending: Family Medicine

## 2024-05-10 ENCOUNTER — Ambulatory Visit (HOSPITAL_COMMUNITY): Payer: Self-pay | Admitting: Family Medicine

## 2024-05-10 VITALS — BP 112/58 | HR 75 | Wt 196.8 lb

## 2024-05-10 DIAGNOSIS — I480 Paroxysmal atrial fibrillation: Secondary | ICD-10-CM | POA: Diagnosis not present

## 2024-05-10 DIAGNOSIS — I4891 Unspecified atrial fibrillation: Secondary | ICD-10-CM | POA: Diagnosis not present

## 2024-05-10 DIAGNOSIS — N184 Chronic kidney disease, stage 4 (severe): Secondary | ICD-10-CM | POA: Diagnosis not present

## 2024-05-10 DIAGNOSIS — I10 Essential (primary) hypertension: Secondary | ICD-10-CM

## 2024-05-10 DIAGNOSIS — I4892 Unspecified atrial flutter: Secondary | ICD-10-CM | POA: Diagnosis not present

## 2024-05-10 DIAGNOSIS — Z87891 Personal history of nicotine dependence: Secondary | ICD-10-CM | POA: Diagnosis not present

## 2024-05-10 DIAGNOSIS — E1122 Type 2 diabetes mellitus with diabetic chronic kidney disease: Secondary | ICD-10-CM | POA: Diagnosis not present

## 2024-05-10 DIAGNOSIS — Z7984 Long term (current) use of oral hypoglycemic drugs: Secondary | ICD-10-CM | POA: Diagnosis not present

## 2024-05-10 DIAGNOSIS — E785 Hyperlipidemia, unspecified: Secondary | ICD-10-CM | POA: Diagnosis not present

## 2024-05-10 DIAGNOSIS — Z955 Presence of coronary angioplasty implant and graft: Secondary | ICD-10-CM | POA: Insufficient documentation

## 2024-05-10 DIAGNOSIS — N183 Chronic kidney disease, stage 3 unspecified: Secondary | ICD-10-CM

## 2024-05-10 DIAGNOSIS — I5032 Chronic diastolic (congestive) heart failure: Secondary | ICD-10-CM | POA: Insufficient documentation

## 2024-05-10 DIAGNOSIS — Z95820 Peripheral vascular angioplasty status with implants and grafts: Secondary | ICD-10-CM | POA: Diagnosis not present

## 2024-05-10 DIAGNOSIS — Z7901 Long term (current) use of anticoagulants: Secondary | ICD-10-CM | POA: Diagnosis not present

## 2024-05-10 DIAGNOSIS — I38 Endocarditis, valve unspecified: Secondary | ICD-10-CM | POA: Diagnosis not present

## 2024-05-10 DIAGNOSIS — Z993 Dependence on wheelchair: Secondary | ICD-10-CM | POA: Insufficient documentation

## 2024-05-10 DIAGNOSIS — I130 Hypertensive heart and chronic kidney disease with heart failure and stage 1 through stage 4 chronic kidney disease, or unspecified chronic kidney disease: Secondary | ICD-10-CM | POA: Diagnosis not present

## 2024-05-10 DIAGNOSIS — I2510 Atherosclerotic heart disease of native coronary artery without angina pectoris: Secondary | ICD-10-CM | POA: Insufficient documentation

## 2024-05-10 DIAGNOSIS — I083 Combined rheumatic disorders of mitral, aortic and tricuspid valves: Secondary | ICD-10-CM | POA: Diagnosis not present

## 2024-05-10 DIAGNOSIS — I110 Hypertensive heart disease with heart failure: Secondary | ICD-10-CM | POA: Diagnosis present

## 2024-05-10 DIAGNOSIS — I255 Ischemic cardiomyopathy: Secondary | ICD-10-CM | POA: Diagnosis not present

## 2024-05-10 DIAGNOSIS — R0602 Shortness of breath: Secondary | ICD-10-CM | POA: Diagnosis present

## 2024-05-10 DIAGNOSIS — Z79899 Other long term (current) drug therapy: Secondary | ICD-10-CM | POA: Diagnosis not present

## 2024-05-10 LAB — BASIC METABOLIC PANEL WITH GFR
Anion gap: 12 (ref 5–15)
BUN: 51 mg/dL — ABNORMAL HIGH (ref 8–23)
CO2: 28 mmol/L (ref 22–32)
Calcium: 9.7 mg/dL (ref 8.9–10.3)
Chloride: 100 mmol/L (ref 98–111)
Creatinine, Ser: 2.91 mg/dL — ABNORMAL HIGH (ref 0.61–1.24)
GFR, Estimated: 20 mL/min — ABNORMAL LOW
Glucose, Bld: 171 mg/dL — ABNORMAL HIGH (ref 70–99)
Potassium: 4.4 mmol/L (ref 3.5–5.1)
Sodium: 139 mmol/L (ref 135–145)

## 2024-05-10 LAB — PRO BRAIN NATRIURETIC PEPTIDE: Pro Brain Natriuretic Peptide: 17276 pg/mL — ABNORMAL HIGH

## 2024-05-10 MED ORDER — TORSEMIDE 100 MG PO TABS: 100.0000 mg | ORAL_TABLET | Freq: Two times a day (BID) | ORAL | Status: AC

## 2024-05-10 MED ORDER — METOLAZONE 2.5 MG PO TABS: 2.5000 mg | ORAL_TABLET | Freq: Every day | ORAL | Status: DC

## 2024-05-10 NOTE — Addendum Note (Signed)
 Encounter addended by: Elinda Rolin SAILOR, [place] on: 05/10/2024 1:43 PM  Actions taken: Flowsheet accepted, Clinical Note Signed, Order list changed, Diagnosis association updated, Charge Capture section accepted

## 2024-05-10 NOTE — Addendum Note (Signed)
 Encounter addended by: Glena Harlene HERO, FNP on: 05/10/2024 2:24 PM  Actions taken: Clinical Note Signed

## 2024-05-10 NOTE — Progress Notes (Signed)
"   ReDS Vest / Clip - 05/10/24 1300       ReDS Vest / Clip   Station Marker C    Ruler Value 23    ReDS Value Range High volume overload    ReDS Actual Value 50          "

## 2024-05-10 NOTE — Patient Instructions (Addendum)
 Good to see you today!   INCREASE torsemide  to 100 mg Twice daily  Metolazone  2.5 mg  weekly ( Tuesdays) dose today  EXTRA potassium 40 meq on Tuesdays Dose today  Labs done today, your results will be available in MyChart, we will contact you for abnormal readings.  REPEAT lab work in 1 week  Your physician recommends that you schedule a follow-up appointment  as scheduled  If you have any questions or concerns before your next appointment please send us  a message through [place] or call our office at [phone].    TO LEAVE A MESSAGE FOR THE NURSE SELECT OPTION 2, PLEASE LEAVE A MESSAGE INCLUDING: YOUR NAME DATE OF BIRTH CALL BACK NUMBER REASON FOR CALL**this is important as we prioritize the call backs  YOU WILL RECEIVE A CALL BACK THE SAME DAY AS LONG AS YOU CALL BEFORE 4:00 PM At the Advanced Heart Failure Clinic, you and your health needs are our priority. As part of our continuing mission to provide you with exceptional heart care, we have created designated Provider Care Teams. These Care Teams include your primary Cardiologist (physician) and Advanced Practice Providers (APPs- Physician Assistants and Nurse Practitioners) who all work together to provide you with the care you need, when you need it.   You may see any of the following providers on your designated Care Team at your next follow up: Dr Toribio Fuel Dr Ezra Shuck Dr. Morene Brownie Greig Mosses, NP Caffie Shed, [place] [facility] [place], [place] Beckey Coe, NP Jordan Lee, NP Ellouise Class, NP Tinnie Redman, PharmD Jaun Bash, PharmD   Please be sure to bring in all your medications bottles to every appointment.    Thank you for choosing [facility]

## 2024-05-16 ENCOUNTER — Telehealth (HOSPITAL_COMMUNITY): Payer: Self-pay

## 2024-05-16 NOTE — Telephone Encounter (Signed)
 Called to confirm/remind patient of their appointment at the Advanced Heart Failure Clinic on 05/17/24.   Appointment:   [] Confirmed  [] Left mess   [x] No answer/No voice mail  [] VM Full/unable to leave message  [] Phone not in service

## 2024-05-16 NOTE — Progress Notes (Signed)
 "  Advanced Heart Failure Clinic Note  ERE:Czolijwip, Prashanthi, MD HF Cardioligst: Dr. Cherrie  HPI: Kevin Schwartz is an 89 y.o. male with DM2, HTN, HL, mild renal insufficiency, RAS s/p stenting of L renal artery in 2008, CAD s/p multiple PCIs.  Admitted 4/22 with left shoulder pain -> NSTEMI. Echo EF 45-50%, HK of basal mid anteroseptal wall. Cath multivessel CAD with 85% proximal RCA, 85% mid RCA, 90% distal RCA, 80% mid LAD, 80% OM 2 lesion. Staged PCI with DES to the mid LAD, balloon angioplasty of left circumflex lesion, the diffusely diseased RCA was treated medically.  4/22 Zio - Sinus with 1AVB occasional Wenckebach and transient junctional rhythm.   Admitted 1/23 with COVID. Echo EF 45 - 50% global HK. RV normal.  Mod-sev MR (previously mild).   Admitted 3/23 with ADHF Echo EF 45-50%, RV mildly reduced, PASP 67.4 mmHg, moderate to severe MR, severe TR, mild to moderate AI. Also with epistaxis requiring anterior nasal packing. EP consulted due to Mobitz Type 1 with 3.4 second pauses. Recommended conservative management with no nodal blocking agents.   Seen in ED 07/22/21 with fall, hit head. CT head negative. Follow up 4/23, volume up, REDs  41%. Torsemide  increased to 40 bid x 2 days, then 60 mg daily.   Follow up 6/23, NYHA II-early III, volume stable on torsemide  40 mg. QTc > 600 on ECG and SSRI stopped.  Admitted 12/25 for left shoulder pain and found to have elevated HS trop [id number]. He denied CP. Elevated trop felt likely demand ischemia/ poor clearance in setting of AKI on CKD. SCr had bumped to 3.8, previous b/l 2.0-3.0 (pt noted 2 days of poor PO intake). Also noted to have lactic acidosis, LA 3.2 but showed downward trend. Was seen by cardiology and not felt to be 2/2 cardiac etiology. AKI was managed w/ IVF and was instructed to hold Jardiance , torsemide  and metolazone . Per d/c summary, he was instructed to continue daily KCL supplementation. Discharged back to [facility].   Today he returns for close HF follow up. He has been volume overload for past several visit and diuretics have been increased. Overall feeling fine. He is mostly WC-dependent but can stand for transfers without undue dyspnea. Wears 2L oxygen at night and PRN during the day. Resides at [facility]. Feet swell occasionally. Denies palpitations, abnormal bleeding, CP, dizziness, or PND/Orthopnea. Appetite ok. Weight at facility 2 days ago was 185 pounds. Taking all medications provided by facility.    ROS: All systems reviewed and negative except as per HPI.   Past Medical History:  Diagnosis Date   Anemia due to chronic kidney disease    Bilateral inguinal hernia    CAD (coronary artery disease) cardiologist-  dr bensimhon   PTCA of OM in 1998, BMS OM in 2000, Cath 9/07 LM ok LAD ok. LCX 95% in OM prior to previous stent RCA. nondominant normal EF normal. Cypher DES to OM 2007 (PLACED PROXIAMAL TO PREVIOUS STENT)   Chronic kidney disease, stage III (moderate) (HCC)    nephrologist-  dr detarding   Depression    Diabetes mellitus type 2, diet-controlled (HCC)    followed by pcp,  last A1c 5.2 on 09-10-2017 in epic   Gout, unspecified    10-29-2017  per pt stable   HLD (hyperlipidemia)    HTN (hypertension)    MGUS (monoclonal gammopathy of unknown significance) previously followed by dr freddie , lov and released 08/01/2011   IgG kappa dx 2002  8% plasma cells in bone marrow; no lesions on bone X-rays;   Nocturia    OA (osteoarthritis)    OSA on CPAP    per study 01-30-2004  severe osa , AHI 51.6/hr   PAD (peripheral artery disease)    05-21-2006  left renal artery stenosis, s/p balloon angioplasty and stenting;  last duplex 02/ 2012  normal , arteries patent   S/P coronary artery stent placement    2000--  BMS x1  to OM;   2007-- DES x1  to OM proximal to previous stent   Secondary hyperparathyroidism of renal origin    Urgency of urination    Wears glasses    Current  Outpatient Medications  Medication Sig Dispense Refill   acetaminophen  (TYLENOL ) 500 MG tablet Take 1,000 mg by mouth 3 (three) times daily as needed for mild pain (pain score 1-3) (knee pain).     allopurinol  (ZYLOPRIM ) 100 MG tablet Take 1 tablet (100 mg total) by mouth daily. 90 tablet 3   apixaban  (ELIQUIS ) 2.5 MG TABS tablet Take 2.5 mg by mouth 2 (two) times daily.     atorvastatin  (LIPITOR ) 80 MG tablet Take 80 mg by mouth at bedtime.     cetirizine (ZYRTEC) 10 MG tablet Take 10 mg by mouth daily.     diclofenac  Sodium (VOLTAREN ) 1 % GEL Apply 2 g topically 4 (four) times daily.     empagliflozin  (JARDIANCE ) 10 MG TABS tablet Take 1 tablet (10 mg total) by mouth daily before breakfast.     ezetimibe  (ZETIA ) 10 MG tablet Take 10 mg by mouth daily.     ferrous sulfate 325 (65 FE) MG EC tablet Take 325 mg by mouth daily.     HYDROcodone -acetaminophen  (NORCO/VICODIN) 5-325 MG tablet Take 1 tablet by mouth every 6 (six) hours as needed for moderate pain (pain score 4-6). 10 tablet 0   levothyroxine  (SYNTHROID ) 25 MCG tablet Take 25 mcg by mouth daily before breakfast.     lidocaine  (LIDODERM ) 5 % Place 1 patch onto the skin daily. Remove & Discard patch within 12 hours or as directed by MD     Menthol, Topical Analgesic, (BIOFREEZE) 4 % GEL Apply 1 application  topically 3 (three) times daily as needed (pain). To right wrist/hand     Menthol, Topical Analgesic, (EUCERIN ITCH RELIEF) 0.1 % LOTN Apply 1 application  topically daily. Apply to chest, abdomen, back, neck, shoulder, rash     methocarbamol (ROBAXIN) 500 MG tablet Take 500 mg by mouth 2 (two) times daily as needed for muscle spasms.     metolazone  (ZAROXOLYN ) 2.5 MG tablet Take 1 tablet (2.5 mg total) by mouth daily.     Multiple Vitamin (MULTIVITAMIN) tablet Take 1 tablet by mouth daily.     pantoprazole  (PROTONIX ) 40 MG tablet Take 1 tablet (40 mg total) by mouth daily. 30 tablet 3   Potassium Chloride  ER 20 MEQ TBCR Take 40 mEq by  mouth daily.     pregabalin (LYRICA) 25 MG capsule Take 25 mg by mouth at bedtime.     Skin Protectants, Misc. (EUCERIN) cream Apply 1 Application topically daily as needed for dry skin.     sodium chloride  (OCEAN) 0.65 % SOLN nasal spray Place 1 spray into both nostrils every 4 (four) hours as needed (for epistaxis).     tamsulosin  (FLOMAX ) 0.4 MG CAPS capsule Take 0.4 mg by mouth daily.     Throat Lozenges (COUGH DROPS MT) Use as directed 1 tablet in  the mouth or throat 3 (three) times daily as needed (cough).     torsemide  (DEMADEX ) 100 MG tablet Take 1 tablet (100 mg total) by mouth 2 (two) times daily.     White Petrolatum  (VASELINE EX) Apply 1 application  topically at bedtime. To bilateral nares     Zinc Oxide 25 % PSTE Apply 1 application  topically 2 (two) times daily. To bottom during incontinence care     No current facility-administered medications for this encounter.   Social History   Tobacco Use   Smoking status: Former    Current packs/day: 0.25    Types: Cigarettes   Smokeless tobacco: Never   Tobacco comments:    10-29-2017  per pt was smoking 1pp2d, stopped June 2019  Vaping Use   Vaping status: Never Used  Substance Use Topics   Alcohol use: Not Currently   Drug use: No   Family History  Problem Relation Age of Onset   Heart disease Mother    Healthy Father    Heart disease Sister    Heart disease Sister    Cancer Sister        type unknown   Hypertension Sister    Diabetes Brother    Diabetes Brother    Diabetes Other    Coronary artery disease Other    Cancer Other    Colon cancer Neg Hx    Esophageal cancer Neg Hx    Pancreatic cancer Neg Hx    Stomach cancer Neg Hx    Wt Readings from Last 3 Encounters:  05/17/24 88.5 kg (195 lb 3.2 oz)  05/10/24 89.3 kg (196 lb 12.8 oz)  04/26/24 89.8 kg (198 lb)   BP 108/60   Pulse 67   Wt 88.5 kg (195 lb 3.2 oz) Comment: Pt was weighed at the facility on 05/10/24  SpO2 99%   BMI 29.68 kg/m   PHYSICAL  EXAM: General:  NAD. No resp difficulty, arrived in [facility] , elderly HEENT: Normal Neck: Supple. JVP 8-10 Cor: Irregular rate & rhythm. No rubs, gallops, 2/6 SEM Lungs: Clear Abdomen: Soft, nontender, nondistended.  Extremities: No cyanosis, clubbing, rash, edema Neuro: Alert & oriented x 3, moves all 4 extremities w/o difficulty. Affect pleasant.  ReDs reading: 36%, abnormal  ASSESSMENT & PLAN:  1. Chronic Diastolic Heart Failure/Ischemic CM - Echo (4/22): EF 45-50%  - Echo (3/23): EF 45-50%, LV mildly down with global LV HK, mild LVH, RV mildly reduced, severely elevated PASP 67.4 mmHg, moderate to severe MR, severe TR, mild to moderate AI. - Echo 1/25 EF 45-50%, GIDD, RV normal  - NYHA IIIb, volume improving ReDs 36%. GDMT limited by recent AKI - Increase metoalzone 2.5 mg to Tuesdays and Fridays with extra 40 KCL on metolazone  days. - Continue torsemide  100 mg bid + 20 KCL bid - Continue Jardiance  10 mg daily - No b-blocker due to HB - No ACE/ARB/ARNi with hx angioedema on ace and arb. - No MRA w/ CKD  - Labs today.  Repeat BMET in 2 weeks  2. CAD  - s/p NSTEMI 4/22.  - Cath 4/22 with multivessel CAD with 85% proximal RCA, 85% mid RCA, 90% distal RCA, 80% mid LAD, 80% OM 2 lesion. Staged PCI with DES to the mid LAD, balloon angioplasty of left circumflex lesion, the diffusely diseased RCA was treated medically. - No chest pain - no ASA given need for Eliquis   - Continue atorvastatin  80 mg daily   3. Paroxysmal Atrial Flutter/AFib - CHA2DS2-VASc  Score = 5  - Zio (6/22): 39% burden  - Seen by EP during 2024 admission, felt to be not a good candidate for AAD - Avoid nodal blockers with hx of HB and hxn rhythm. - He is in rate controlled AF today - Continue Eliquis  2.5 mg bid. No bleeding issues - Discussed DCCV with patient. He prefers to hold off on any procedures. Discucssed with Dr. Bensimhon  4. HTN - BP well-controlled - Continue current regimen   5. Aortic  insufficiency/severe TR/moderate MR - Mild AI on echo 4/22 (stable from 2018) - Mild to moderate AI on echo 3/23. - Severe TR on echo 3/23 - Moderate to severe MR on echo 3/23 - Not mTEER candidate currently with lack of symptoms and with frailty/debility.  6. CKD IIIb-IV  - Baseline Scr ~2.0-3.0 - Continue Jardiance , as above - Follows with Nephrology - Labs today.  Follow up in 3 months with Dr. Cherrie Harlene CHRISTELLA Glena, FNP  05/17/24 8:53 AM "

## 2024-05-17 ENCOUNTER — Encounter (HOSPITAL_COMMUNITY): Payer: Self-pay

## 2024-05-17 ENCOUNTER — Ambulatory Visit (HOSPITAL_COMMUNITY): Payer: Self-pay | Admitting: Family Medicine

## 2024-05-17 ENCOUNTER — Ambulatory Visit (HOSPITAL_COMMUNITY)
Admission: RE | Admit: 2024-05-17 | Discharge: 2024-05-17 | Disposition: A | Source: Ambulatory Visit | Attending: Family Medicine

## 2024-05-17 VITALS — BP 108/60 | HR 67 | Wt 195.2 lb

## 2024-05-17 DIAGNOSIS — I130 Hypertensive heart and chronic kidney disease with heart failure and stage 1 through stage 4 chronic kidney disease, or unspecified chronic kidney disease: Secondary | ICD-10-CM | POA: Insufficient documentation

## 2024-05-17 DIAGNOSIS — I2510 Atherosclerotic heart disease of native coronary artery without angina pectoris: Secondary | ICD-10-CM | POA: Insufficient documentation

## 2024-05-17 DIAGNOSIS — I480 Paroxysmal atrial fibrillation: Secondary | ICD-10-CM | POA: Diagnosis not present

## 2024-05-17 DIAGNOSIS — Z955 Presence of coronary angioplasty implant and graft: Secondary | ICD-10-CM | POA: Insufficient documentation

## 2024-05-17 DIAGNOSIS — I255 Ischemic cardiomyopathy: Secondary | ICD-10-CM | POA: Insufficient documentation

## 2024-05-17 DIAGNOSIS — N1832 Chronic kidney disease, stage 3b: Secondary | ICD-10-CM | POA: Insufficient documentation

## 2024-05-17 DIAGNOSIS — I5032 Chronic diastolic (congestive) heart failure: Secondary | ICD-10-CM | POA: Insufficient documentation

## 2024-05-17 DIAGNOSIS — E1151 Type 2 diabetes mellitus with diabetic peripheral angiopathy without gangrene: Secondary | ICD-10-CM | POA: Insufficient documentation

## 2024-05-17 DIAGNOSIS — I4891 Unspecified atrial fibrillation: Secondary | ICD-10-CM | POA: Insufficient documentation

## 2024-05-17 DIAGNOSIS — I252 Old myocardial infarction: Secondary | ICD-10-CM | POA: Insufficient documentation

## 2024-05-17 DIAGNOSIS — Z87891 Personal history of nicotine dependence: Secondary | ICD-10-CM | POA: Insufficient documentation

## 2024-05-17 DIAGNOSIS — I082 Rheumatic disorders of both aortic and tricuspid valves: Secondary | ICD-10-CM | POA: Insufficient documentation

## 2024-05-17 DIAGNOSIS — E1122 Type 2 diabetes mellitus with diabetic chronic kidney disease: Secondary | ICD-10-CM | POA: Insufficient documentation

## 2024-05-17 DIAGNOSIS — I4892 Unspecified atrial flutter: Secondary | ICD-10-CM | POA: Insufficient documentation

## 2024-05-17 DIAGNOSIS — N183 Chronic kidney disease, stage 3 unspecified: Secondary | ICD-10-CM

## 2024-05-17 DIAGNOSIS — I38 Endocarditis, valve unspecified: Secondary | ICD-10-CM | POA: Diagnosis not present

## 2024-05-17 DIAGNOSIS — I10 Essential (primary) hypertension: Secondary | ICD-10-CM

## 2024-05-17 DIAGNOSIS — D631 Anemia in chronic kidney disease: Secondary | ICD-10-CM | POA: Insufficient documentation

## 2024-05-17 DIAGNOSIS — Z79899 Other long term (current) drug therapy: Secondary | ICD-10-CM | POA: Insufficient documentation

## 2024-05-17 DIAGNOSIS — Z7984 Long term (current) use of oral hypoglycemic drugs: Secondary | ICD-10-CM | POA: Insufficient documentation

## 2024-05-17 DIAGNOSIS — Z7901 Long term (current) use of anticoagulants: Secondary | ICD-10-CM | POA: Insufficient documentation

## 2024-05-17 LAB — BASIC METABOLIC PANEL WITH GFR
Anion gap: 13 (ref 5–15)
BUN: 55 mg/dL — ABNORMAL HIGH (ref 8–23)
CO2: 28 mmol/L (ref 22–32)
Calcium: 10 mg/dL (ref 8.9–10.3)
Chloride: 99 mmol/L (ref 98–111)
Creatinine, Ser: 3.04 mg/dL — ABNORMAL HIGH (ref 0.61–1.24)
GFR, Estimated: 19 mL/min — ABNORMAL LOW
Glucose, Bld: 122 mg/dL — ABNORMAL HIGH (ref 70–99)
Potassium: 4.4 mmol/L (ref 3.5–5.1)
Sodium: 140 mmol/L (ref 135–145)

## 2024-05-17 LAB — PRO BRAIN NATRIURETIC PEPTIDE: Pro Brain Natriuretic Peptide: 10910 pg/mL — ABNORMAL HIGH

## 2024-05-17 MED ORDER — METOLAZONE 2.5 MG PO TABS: 2.5000 mg | ORAL_TABLET | ORAL | Status: AC

## 2024-05-17 MED ORDER — POTASSIUM CHLORIDE ER 20 MEQ PO TBCR: 40.0000 meq | EXTENDED_RELEASE_TABLET | Freq: Every day | ORAL | Status: AC

## 2024-05-17 NOTE — Patient Instructions (Addendum)
 Good to see you today!  INCREASE metolazone  to 2.5 mg   2 x a week(Tuesdays and Fridays)  Take an additional 40 meq of potassium on Tuesdays and Fridays  Labs done today, your results will be available in MyChart, we will contact you for abnormal readings  Repeat lab work in 2 weeks  Your physician recommends that you schedule a follow-up appointment as scheduled  If you have any questions or concerns before your next appointment please send us  a message through [facility] or call our office at [phone].    TO LEAVE A MESSAGE FOR THE NURSE SELECT OPTION 2, PLEASE LEAVE A MESSAGE INCLUDING: YOUR NAME DATE OF BIRTH CALL BACK NUMBER REASON FOR CALL**this is important as we prioritize the call backs  YOU WILL RECEIVE A CALL BACK THE SAME DAY AS LONG AS YOU CALL BEFORE 4:00 PM  At [facility], you and your health needs are our priority. As part of our continuing mission to provide you with exceptional heart care, we have created designated Provider Care Teams. These Care Teams include your primary Cardiologist (physician) and Advanced Practice Providers (APPs- Physician Assistants and Nurse Practitioners) who all work together to provide you with the care you need, when you need it.   You may see any of the following providers on your designated Care Team at your next follow up: Dr Toribio Fuel Dr Ezra Shuck Dr. Morene Brownie Greig Mosses, NP Caffie Shed, [place] [facility] [place], [place] Beckey Coe, NP Jordan Lee, NP Ellouise Class, NP Tinnie Redman, PharmD Jaun Bash, PharmD   Please be sure to bring in all your medications bottles to every appointment.    Thank you for choosing [facility]

## 2024-05-17 NOTE — Progress Notes (Signed)
"   ReDS Vest / Clip - 05/17/24 0900       ReDS Vest / Clip   Station Marker C    Ruler Value 30.5    ReDS Value Range Moderate volume overload    ReDS Actual Value 36          "

## 2024-07-29 ENCOUNTER — Ambulatory Visit (HOSPITAL_COMMUNITY): Admitting: Internal Medicine
